# Patient Record
Sex: Male | Born: 1950 | Race: White | Hispanic: No | Marital: Married | Smoking: Never smoker
Health system: Southern US, Community
[De-identification: ages and names within clinical notes are randomized; demographics above are authoritative.]

## PROBLEM LIST (undated history)

## (undated) DIAGNOSIS — I2510 Atherosclerotic heart disease of native coronary artery without angina pectoris: Secondary | ICD-10-CM

## (undated) DIAGNOSIS — I10 Essential (primary) hypertension: Secondary | ICD-10-CM

## (undated) NOTE — *Deleted (*Deleted)
Recreational Therapy Assessment and Plan  Patient Details  Name: Tyler Richards MRN: [id number] Date of Birth: Apr 05, 1951 Today's Date: 07/15/2020  Rehab Potential:   ELOS:     Hospital Problem: Principal Problem:   Right middle cerebral artery stroke [facility])   Past Medical History:      Past Medical History:  Diagnosis Date  . Coronary artery disease   . Hypertension    Past Surgical History:       Past Surgical History:  Procedure Laterality Date  . IR ANGIO INTRA EXTRACRAN SEL COM CAROTID INNOMINATE UNI L MOD SED  06/29/2020  . IR CT HEAD LTD  06/29/2020  . IR CT HEAD LTD  06/29/2020  . IR INTRAVSC STENT CERV CAROTID W/O EMB-PROT MOD SED INC ANGIO  06/29/2020  . IR PERCUTANEOUS ART THROMBECTOMY/INFUSION INTRACRANIAL INC DIAG ANGIO  06/29/2020  . RADIOLOGY WITH ANESTHESIA N/A 06/29/2020   Procedure: IR WITH ANESTHESIA;  Surgeon: Radiologist, Medication, MD;  Location: [facility] OR;  Service: Radiology;  Laterality: N/A;    Assessment & Plan Clinical Impression: Patient is a 48 y.o. year old right-handed male with unremarkable past medical history no prescription medications. Per chart review lives with spouse independent and active prior to admission. 1 level home 2 steps to entry. Presented 06/29/2020 with acute onset of left-sided weakness and slurred speech. Cranial CT scan showed hyperdense distal right ICA and proximal MCA. Blunted appearance of the posterior right putamen. Chronic right high frontal cortex infarct. Patient did not receive TPA. CT angiogram of head and neck emergent large vessel occlusion with no flow seen in the right internal carotid artery or proximal MCA. Patient underwent right MCA thrombectomy and right ICA stent placement 06/29/2020 per interventional radiology. Most recent MRI and imaging revealed acute infarct right basal ganglia with nonprogressive hemorrhage when correlated with a prior CT of 06/29/2020. Lower extremity Dopplers no signs of  DVT. Carotid Dopplers no ICA stenosis. Echocardiogram with ejection fraction of 50 to 55% grade 1 diastolic dysfunction. Admission chemistries unremarkable aside from glucose 104 urine drug screen negative. Patient did receive cardiology consult for bradycardia consistent with hyper vagotonia and currently maintained on ProAmatine as well as Florinef. EKG normal sinus rhythm 70s to 80s occasional sinus bradycardia. Patient was initially maintained on aspirin as well as Brilinta. Patient was extubated 06/30/2020. Urine culture greater 100,000 Enterobacter placed on Maxipime changed to Bactrim. Gastroenterology services consulted due to some bright red blood with bowel movement currently holding off on any endoscopic evaluation monitor hemoglobin hematocrit with latest hemoglobin 9.8 and no further episodes reported. His Brilinta has been placed on hold after rectal outlet bleeding and continues only on low-dose aspirin at the recommendations of neurology services. Currently on a dysphagia #1 thin liquid diet. Therapy evaluations completed and patient was admitted for a comprehensive rehab program. Patient transferred to CIR on 07/09/2020 .    Plan    Recommendations for other services: {RECOMMENDATIONS FOR OTHER SERVICES:3049016}  Discharge Criteria: Patient will be discharged from TR if patient refuses treatment 3 consecutive times without medical reason.  If treatment goals not met, if there is a change in medical status, if patient makes no progress towards goals or if patient is discharged from hospital.  The above assessment, treatment plan, treatment alternatives and goals were discussed and mutually agreed upon: {Assessment/Treatment Plan Discussed/Agreed:3049017}  Benny Henrie 07/15/2020, 9:22 AM

---

## 2020-06-29 ENCOUNTER — Encounter (HOSPITAL_COMMUNITY): Payer: Self-pay | Admitting: Neurology

## 2020-06-29 ENCOUNTER — Emergency Department (HOSPITAL_COMMUNITY): Payer: Medicare Other

## 2020-06-29 ENCOUNTER — Emergency Department (HOSPITAL_COMMUNITY): Payer: Medicare Other | Admitting: Anesthesiology

## 2020-06-29 ENCOUNTER — Inpatient Hospital Stay (HOSPITAL_COMMUNITY): Payer: Medicare Other

## 2020-06-29 ENCOUNTER — Inpatient Hospital Stay (HOSPITAL_COMMUNITY)
Admission: EM | Admit: 2020-06-29 | Discharge: 2020-07-09 | DRG: 023 | Disposition: A | Discharge status: Rehabilitation Facility | Payer: Medicare Other | Attending: Family Medicine | Admitting: Family Medicine

## 2020-06-29 ENCOUNTER — Encounter (HOSPITAL_COMMUNITY)
Admission: EM | Disposition: A | Discharge status: Rehabilitation Facility | Payer: Self-pay | Source: Home / Self Care | Attending: Neurology

## 2020-06-29 DIAGNOSIS — I495 Sick sinus syndrome: Secondary | ICD-10-CM

## 2020-06-29 DIAGNOSIS — K922 Gastrointestinal hemorrhage, unspecified: Secondary | ICD-10-CM

## 2020-06-29 DIAGNOSIS — I63519 Cerebral infarction due to unspecified occlusion or stenosis of unspecified middle cerebral artery: Secondary | ICD-10-CM

## 2020-06-29 DIAGNOSIS — Z23 Encounter for immunization: Secondary | ICD-10-CM | POA: Diagnosis present

## 2020-06-29 DIAGNOSIS — R945 Abnormal results of liver function studies: Secondary | ICD-10-CM

## 2020-06-29 DIAGNOSIS — I69328 Other speech and language deficits following cerebral infarction: Secondary | ICD-10-CM | POA: Diagnosis not present

## 2020-06-29 DIAGNOSIS — I63231 Cerebral infarction due to unspecified occlusion or stenosis of right carotid arteries: Secondary | ICD-10-CM | POA: Diagnosis not present

## 2020-06-29 DIAGNOSIS — I610 Nontraumatic intracerebral hemorrhage in hemisphere, subcortical: Secondary | ICD-10-CM | POA: Diagnosis not present

## 2020-06-29 DIAGNOSIS — H5347 Heteronymous bilateral field defects: Secondary | ICD-10-CM | POA: Diagnosis present

## 2020-06-29 DIAGNOSIS — G8114 Spastic hemiplegia affecting left nondominant side: Secondary | ICD-10-CM | POA: Diagnosis not present

## 2020-06-29 DIAGNOSIS — I63311 Cerebral infarction due to thrombosis of right middle cerebral artery: Secondary | ICD-10-CM | POA: Diagnosis not present

## 2020-06-29 DIAGNOSIS — Z20822 Contact with and (suspected) exposure to covid-19: Secondary | ICD-10-CM | POA: Diagnosis present

## 2020-06-29 DIAGNOSIS — G9349 Other encephalopathy: Secondary | ICD-10-CM | POA: Diagnosis present

## 2020-06-29 DIAGNOSIS — I6930 Unspecified sequelae of cerebral infarction: Secondary | ICD-10-CM | POA: Diagnosis present

## 2020-06-29 DIAGNOSIS — I619 Nontraumatic intracerebral hemorrhage, unspecified: Secondary | ICD-10-CM | POA: Diagnosis not present

## 2020-06-29 DIAGNOSIS — I4510 Unspecified right bundle-branch block: Secondary | ICD-10-CM | POA: Diagnosis not present

## 2020-06-29 DIAGNOSIS — R414 Neurologic neglect syndrome: Secondary | ICD-10-CM | POA: Diagnosis present

## 2020-06-29 DIAGNOSIS — G9340 Encephalopathy, unspecified: Secondary | ICD-10-CM

## 2020-06-29 DIAGNOSIS — B9689 Other specified bacterial agents as the cause of diseases classified elsewhere: Secondary | ICD-10-CM | POA: Diagnosis not present

## 2020-06-29 DIAGNOSIS — G8194 Hemiplegia, unspecified affecting left nondominant side: Secondary | ICD-10-CM | POA: Diagnosis present

## 2020-06-29 DIAGNOSIS — R4182 Altered mental status, unspecified: Secondary | ICD-10-CM | POA: Diagnosis present

## 2020-06-29 DIAGNOSIS — R29717 NIHSS score 17: Secondary | ICD-10-CM | POA: Diagnosis present

## 2020-06-29 DIAGNOSIS — J95821 Acute postprocedural respiratory failure: Secondary | ICD-10-CM | POA: Diagnosis not present

## 2020-06-29 DIAGNOSIS — R471 Dysarthria and anarthria: Secondary | ICD-10-CM | POA: Diagnosis present

## 2020-06-29 DIAGNOSIS — I6521 Occlusion and stenosis of right carotid artery: Secondary | ICD-10-CM | POA: Diagnosis present

## 2020-06-29 DIAGNOSIS — N390 Urinary tract infection, site not specified: Secondary | ICD-10-CM | POA: Diagnosis not present

## 2020-06-29 DIAGNOSIS — K5900 Constipation, unspecified: Secondary | ICD-10-CM | POA: Diagnosis not present

## 2020-06-29 DIAGNOSIS — M109 Gout, unspecified: Secondary | ICD-10-CM | POA: Diagnosis present

## 2020-06-29 DIAGNOSIS — J9601 Acute respiratory failure with hypoxia: Secondary | ICD-10-CM | POA: Insufficient documentation

## 2020-06-29 DIAGNOSIS — K921 Melena: Secondary | ICD-10-CM | POA: Diagnosis not present

## 2020-06-29 DIAGNOSIS — I6389 Other cerebral infarction: Secondary | ICD-10-CM | POA: Diagnosis not present

## 2020-06-29 DIAGNOSIS — I69391 Dysphagia following cerebral infarction: Secondary | ICD-10-CM | POA: Diagnosis not present

## 2020-06-29 DIAGNOSIS — J9690 Respiratory failure, unspecified, unspecified whether with hypoxia or hypercapnia: Secondary | ICD-10-CM | POA: Diagnosis present

## 2020-06-29 DIAGNOSIS — I950 Idiopathic hypotension: Secondary | ICD-10-CM | POA: Diagnosis not present

## 2020-06-29 DIAGNOSIS — T82856A Stenosis of peripheral vascular stent, initial encounter: Secondary | ICD-10-CM | POA: Diagnosis not present

## 2020-06-29 DIAGNOSIS — E876 Hypokalemia: Secondary | ICD-10-CM | POA: Diagnosis not present

## 2020-06-29 DIAGNOSIS — M25521 Pain in right elbow: Secondary | ICD-10-CM | POA: Diagnosis not present

## 2020-06-29 DIAGNOSIS — T45525A Adverse effect of antithrombotic drugs, initial encounter: Secondary | ICD-10-CM | POA: Diagnosis not present

## 2020-06-29 DIAGNOSIS — R001 Bradycardia, unspecified: Secondary | ICD-10-CM | POA: Diagnosis not present

## 2020-06-29 DIAGNOSIS — R55 Syncope and collapse: Secondary | ICD-10-CM | POA: Diagnosis not present

## 2020-06-29 DIAGNOSIS — I4430 Unspecified atrioventricular block: Secondary | ICD-10-CM | POA: Diagnosis not present

## 2020-06-29 DIAGNOSIS — I639 Cerebral infarction, unspecified: Secondary | ICD-10-CM | POA: Diagnosis present

## 2020-06-29 DIAGNOSIS — R1310 Dysphagia, unspecified: Secondary | ICD-10-CM | POA: Diagnosis present

## 2020-06-29 DIAGNOSIS — E785 Hyperlipidemia, unspecified: Secondary | ICD-10-CM | POA: Diagnosis present

## 2020-06-29 DIAGNOSIS — R509 Fever, unspecified: Secondary | ICD-10-CM

## 2020-06-29 DIAGNOSIS — R29810 Facial weakness: Secondary | ICD-10-CM | POA: Diagnosis present

## 2020-06-29 DIAGNOSIS — T457X5A Adverse effect of anticoagulant antagonists, vitamin K and other coagulants, initial encounter: Secondary | ICD-10-CM

## 2020-06-29 DIAGNOSIS — D75839 Thrombocytosis, unspecified: Secondary | ICD-10-CM | POA: Diagnosis not present

## 2020-06-29 DIAGNOSIS — I63511 Cerebral infarction due to unspecified occlusion or stenosis of right middle cerebral artery: Principal | ICD-10-CM | POA: Diagnosis present

## 2020-06-29 DIAGNOSIS — I6601 Occlusion and stenosis of right middle cerebral artery: Secondary | ICD-10-CM | POA: Diagnosis not present

## 2020-06-29 DIAGNOSIS — I69354 Hemiplegia and hemiparesis following cerebral infarction affecting left non-dominant side: Secondary | ICD-10-CM | POA: Diagnosis not present

## 2020-06-29 DIAGNOSIS — K625 Hemorrhage of anus and rectum: Secondary | ICD-10-CM | POA: Diagnosis not present

## 2020-06-29 DIAGNOSIS — R569 Unspecified convulsions: Secondary | ICD-10-CM | POA: Diagnosis not present

## 2020-06-29 DIAGNOSIS — I10 Essential (primary) hypertension: Secondary | ICD-10-CM | POA: Diagnosis present

## 2020-06-29 DIAGNOSIS — M62838 Other muscle spasm: Secondary | ICD-10-CM | POA: Diagnosis not present

## 2020-06-29 DIAGNOSIS — G8110 Spastic hemiplegia affecting unspecified side: Secondary | ICD-10-CM | POA: Diagnosis not present

## 2020-06-29 DIAGNOSIS — I951 Orthostatic hypotension: Secondary | ICD-10-CM | POA: Diagnosis not present

## 2020-06-29 DIAGNOSIS — G522 Disorders of vagus nerve: Secondary | ICD-10-CM | POA: Diagnosis not present

## 2020-06-29 DIAGNOSIS — R7989 Other specified abnormal findings of blood chemistry: Secondary | ICD-10-CM | POA: Diagnosis not present

## 2020-06-29 DIAGNOSIS — M25562 Pain in left knee: Secondary | ICD-10-CM | POA: Diagnosis not present

## 2020-06-29 DIAGNOSIS — R197 Diarrhea, unspecified: Secondary | ICD-10-CM | POA: Diagnosis not present

## 2020-06-29 DIAGNOSIS — R32 Unspecified urinary incontinence: Secondary | ICD-10-CM | POA: Diagnosis not present

## 2020-06-29 DIAGNOSIS — T444X5A Adverse effect of predominantly alpha-adrenoreceptor agonists, initial encounter: Secondary | ICD-10-CM | POA: Diagnosis not present

## 2020-06-29 DIAGNOSIS — Z789 Other specified health status: Secondary | ICD-10-CM

## 2020-06-29 HISTORY — PX: IR ANGIO INTRA EXTRACRAN SEL COM CAROTID INNOMINATE UNI L MOD SED: IMG5358

## 2020-06-29 HISTORY — PX: IR CT HEAD LTD: IMG2386

## 2020-06-29 HISTORY — PX: IR INTRAVSC STENT CERV CAROTID W/O EMB-PROT MOD SED INC ANGIO: IMG2304

## 2020-06-29 HISTORY — PX: IR PERCUTANEOUS ART THROMBECTOMY/INFUSION INTRACRANIAL INC DIAG ANGIO: IMG6087

## 2020-06-29 HISTORY — PX: RADIOLOGY WITH ANESTHESIA: SHX6223

## 2020-06-29 LAB — POCT I-STAT 7, (LYTES, BLD GAS, ICA,H+H)
Acid-base deficit: 1 mmol/L (ref 0.0–2.0)
Bicarbonate: 24.4 mmol/L (ref 20.0–28.0)
Calcium, Ion: 1.21 mmol/L (ref 1.15–1.40)
HCT: 35 % — ABNORMAL LOW (ref 39.0–52.0)
Hemoglobin: 11.9 g/dL — ABNORMAL LOW (ref 13.0–17.0)
O2 Saturation: 100 %
Potassium: 3.8 mmol/L (ref 3.5–5.1)
Sodium: 138 mmol/L (ref 135–145)
TCO2: 26 mmol/L (ref 22–32)
pCO2 arterial: 41.3 mmHg (ref 32.0–48.0)
pH, Arterial: 7.38 (ref 7.350–7.450)
pO2, Arterial: 252 mmHg — ABNORMAL HIGH (ref 83.0–108.0)

## 2020-06-29 LAB — COMPREHENSIVE METABOLIC PANEL
ALT: 17 U/L (ref 0–44)
AST: 36 U/L (ref 15–41)
Albumin: 3.3 g/dL — ABNORMAL LOW (ref 3.5–5.0)
Alkaline Phosphatase: 61 U/L (ref 38–126)
Anion gap: 13 (ref 5–15)
BUN: 14 mg/dL (ref 8–23)
CO2: 20 mmol/L — ABNORMAL LOW (ref 22–32)
Calcium: 9.1 mg/dL (ref 8.9–10.3)
Chloride: 103 mmol/L (ref 98–111)
Creatinine, Ser: 0.87 mg/dL (ref 0.61–1.24)
GFR calc Af Amer: >60 mL/min (ref >60)
GFR calc non Af Amer: >60 mL/min (ref >60)
Glucose, Bld: 104 mg/dL — ABNORMAL HIGH (ref 70–99)
Potassium: 5 mmol/L (ref 3.5–5.1)
Sodium: 136 mmol/L (ref 135–145)
Total Bilirubin: 1.8 mg/dL — ABNORMAL HIGH (ref 0.3–1.2)
Total Protein: 6.5 g/dL (ref 6.5–8.1)

## 2020-06-29 LAB — URINALYSIS, ROUTINE W REFLEX MICROSCOPIC
Bilirubin Urine: NEGATIVE
Glucose, UA: NEGATIVE mg/dL
Ketones, ur: NEGATIVE mg/dL
Leukocytes,Ua: NEGATIVE
Nitrite: NEGATIVE
Protein, ur: NEGATIVE mg/dL
Specific Gravity, Urine: 1.044 — ABNORMAL HIGH (ref 1.005–1.030)
pH: 5 (ref 5.0–8.0)

## 2020-06-29 LAB — DIFFERENTIAL
Abs Immature Granulocytes: 0.04 10*3/uL (ref 0.00–0.07)
Basophils Absolute: 0.1 10*3/uL (ref 0.0–0.1)
Basophils Relative: 1 %
Eosinophils Absolute: 0 10*3/uL (ref 0.0–0.5)
Eosinophils Relative: 0 %
Immature Granulocytes: 0 %
Lymphocytes Relative: 12 %
Lymphs Abs: 1.1 10*3/uL (ref 0.7–4.0)
Monocytes Absolute: 0.9 10*3/uL (ref 0.1–1.0)
Monocytes Relative: 10 %
Neutro Abs: 6.8 10*3/uL (ref 1.7–7.7)
Neutrophils Relative %: 77 %

## 2020-06-29 LAB — RAPID URINE DRUG SCREEN, HOSP PERFORMED
Amphetamines: NOT DETECTED
Barbiturates: NOT DETECTED
Benzodiazepines: NOT DETECTED
Cocaine: NOT DETECTED
Opiates: NOT DETECTED
Tetrahydrocannabinol: NOT DETECTED

## 2020-06-29 LAB — CBG MONITORING, ED: Glucose-Capillary: 88 mg/dL (ref 70–99)

## 2020-06-29 LAB — CBC
HCT: 41.2 % (ref 39.0–52.0)
Hemoglobin: 13.9 g/dL (ref 13.0–17.0)
MCH: 32.6 pg (ref 26.0–34.0)
MCHC: 33.7 g/dL (ref 30.0–36.0)
MCV: 96.5 fL (ref 80.0–100.0)
Platelets: 320 10*3/uL (ref 150–400)
RBC: 4.27 MIL/uL (ref 4.22–5.81)
RDW: 12.5 % (ref 11.5–15.5)
WBC: 9 10*3/uL (ref 4.0–10.5)
nRBC: 0 % (ref 0.0–0.2)

## 2020-06-29 LAB — I-STAT CHEM 8, ED
BUN: 17 mg/dL (ref 8–23)
Calcium, Ion: 1.12 mmol/L — ABNORMAL LOW (ref 1.15–1.40)
Chloride: 105 mmol/L (ref 98–111)
Creatinine, Ser: 0.7 mg/dL (ref 0.61–1.24)
Glucose, Bld: 105 mg/dL — ABNORMAL HIGH (ref 70–99)
HCT: 42 % (ref 39.0–52.0)
Hemoglobin: 14.3 g/dL (ref 13.0–17.0)
Potassium: 4.5 mmol/L (ref 3.5–5.1)
Sodium: 138 mmol/L (ref 135–145)
TCO2: 23 mmol/L (ref 22–32)

## 2020-06-29 LAB — MRSA PCR SCREENING: MRSA by PCR: NEGATIVE

## 2020-06-29 LAB — RESPIRATORY PANEL BY RT PCR (FLU A&B, COVID)
Influenza A by PCR: NEGATIVE
Influenza B by PCR: NEGATIVE
SARS Coronavirus 2 by RT PCR: NEGATIVE

## 2020-06-29 LAB — PROTIME-INR
INR: 1 (ref 0.8–1.2)
Prothrombin Time: 12.4 seconds (ref 11.4–15.2)

## 2020-06-29 LAB — APTT: aPTT: 28 seconds (ref 24–36)

## 2020-06-29 LAB — ETHANOL: Alcohol, Ethyl (B): <10 mg/dL (ref <10)

## 2020-06-29 SURGERY — IR WITH ANESTHESIA
Anesthesia: General

## 2020-06-29 MED ORDER — IOHEXOL 300 MG/ML  SOLN
150.0000 mL | Freq: Once | INTRAMUSCULAR | Status: AC | PRN
  Administered 2020-06-29: 75 mL via INTRAVENOUS

## 2020-06-29 MED ORDER — ASPIRIN 325 MG PO TABS
ORAL_TABLET | ORAL | Status: DC | PRN
  Administered 2020-06-29: 81 mg

## 2020-06-29 MED ORDER — SUGAMMADEX SODIUM 200 MG/2ML IV SOLN
INTRAVENOUS | Status: DC | PRN
  Administered 2020-06-29: 200 mg via INTRAVENOUS

## 2020-06-29 MED ORDER — PROPOFOL 500 MG/50ML IV EMUL
INTRAVENOUS | Status: DC | PRN
  Administered 2020-06-29: 50 ug/kg/min via INTRAVENOUS

## 2020-06-29 MED ORDER — PANTOPRAZOLE SODIUM 40 MG IV SOLR
40.0000 mg | Freq: Every day | INTRAVENOUS | Status: DC
  Administered 2020-06-30: 40 mg via INTRAVENOUS
  Filled 2020-06-29 (×2): qty 40

## 2020-06-29 MED ORDER — PROPOFOL 1000 MG/100ML IV EMUL: 0.0000 ug/kg/min | INTRAVENOUS | Status: DC

## 2020-06-29 MED ORDER — CLOPIDOGREL BISULFATE 300 MG PO TABS
ORAL_TABLET | ORAL | Status: AC
  Filled 2020-06-29: qty 1

## 2020-06-29 MED ORDER — LACTATED RINGERS IV SOLN: INTRAVENOUS | Status: DC | PRN

## 2020-06-29 MED ORDER — ASPIRIN 81 MG PO CHEW
CHEWABLE_TABLET | ORAL | Status: AC
  Filled 2020-06-29: qty 1

## 2020-06-29 MED ORDER — INFLUENZA VAC A&B SA ADJ QUAD 0.5 ML IM PRSY
0.5000 mL | PREFILLED_SYRINGE | INTRAMUSCULAR | Status: AC
  Administered 2020-07-09: 0.5 mL via INTRAMUSCULAR
  Filled 2020-06-29 (×2): qty 0.5

## 2020-06-29 MED ORDER — FENTANYL CITRATE (PF) 100 MCG/2ML IJ SOLN: 25.0000 ug | INTRAMUSCULAR | Status: DC | PRN

## 2020-06-29 MED ORDER — ACETAMINOPHEN 325 MG PO TABS: 650.0000 mg | ORAL_TABLET | ORAL | Status: DC | PRN

## 2020-06-29 MED ORDER — TICAGRELOR 90 MG PO TABS
90.0000 mg | ORAL_TABLET | Freq: Two times a day (BID) | ORAL | Status: DC
  Administered 2020-07-01 – 2020-07-02 (×4): 90 mg via ORAL
  Filled 2020-06-29 (×4): qty 1

## 2020-06-29 MED ORDER — PNEUMOCOCCAL VAC POLYVALENT 25 MCG/0.5ML IJ INJ
0.5000 mL | INJECTION | INTRAMUSCULAR | Status: AC
  Administered 2020-07-09: 0.5 mL via INTRAMUSCULAR
  Filled 2020-06-29 (×2): qty 0.5

## 2020-06-29 MED ORDER — ACETAMINOPHEN 650 MG RE SUPP: 650.0000 mg | RECTAL | Status: DC | PRN

## 2020-06-29 MED ORDER — TIROFIBAN HCL IN NACL 5-0.9 MG/100ML-% IV SOLN
INTRAVENOUS | Status: AC
  Filled 2020-06-29: qty 100

## 2020-06-29 MED ORDER — TICAGRELOR 90 MG PO TABS
90.0000 mg | ORAL_TABLET | Freq: Two times a day (BID) | ORAL | Status: DC
  Administered 2020-06-29 – 2020-06-30 (×2): 90 mg
  Filled 2020-06-29 (×2): qty 1

## 2020-06-29 MED ORDER — DOCUSATE SODIUM 50 MG/5ML PO LIQD
100.0000 mg | Freq: Two times a day (BID) | ORAL | Status: DC
  Filled 2020-06-29 (×2): qty 10

## 2020-06-29 MED ORDER — IOHEXOL 300 MG/ML  SOLN
50.0000 mL | Freq: Once | INTRAMUSCULAR | Status: AC | PRN
  Administered 2020-06-29: 25 mL via INTRAVENOUS

## 2020-06-29 MED ORDER — ONDANSETRON HCL 4 MG/2ML IJ SOLN
INTRAMUSCULAR | Status: DC | PRN
  Administered 2020-06-29: 4 mg via INTRAVENOUS

## 2020-06-29 MED ORDER — SENNOSIDES-DOCUSATE SODIUM 8.6-50 MG PO TABS: 1.0000 | ORAL_TABLET | Freq: Every evening | ORAL | Status: DC | PRN

## 2020-06-29 MED ORDER — LIDOCAINE HCL (PF) 1 % IJ SOLN
INTRAMUSCULAR | Status: DC | PRN
  Administered 2020-06-29: 5 mL

## 2020-06-29 MED ORDER — SODIUM CHLORIDE 0.9 % IV SOLN: 250.0000 mL | INTRAVENOUS | Status: DC

## 2020-06-29 MED ORDER — GLYCOPYRROLATE PF 0.2 MG/ML IJ SOSY
PREFILLED_SYRINGE | INTRAMUSCULAR | Status: DC | PRN
  Administered 2020-06-29: .2 mg via INTRAVENOUS
  Administered 2020-06-29 (×3): .1 mg via INTRAVENOUS

## 2020-06-29 MED ORDER — IOHEXOL 350 MG/ML SOLN
100.0000 mL | Freq: Once | INTRAVENOUS | Status: AC | PRN
  Administered 2020-06-29: 100 mL via INTRAVENOUS

## 2020-06-29 MED ORDER — NITROGLYCERIN 1 MG/10 ML FOR IR/CATH LAB
INTRA_ARTERIAL | Status: AC
  Filled 2020-06-29: qty 10

## 2020-06-29 MED ORDER — CLEVIDIPINE BUTYRATE 0.5 MG/ML IV EMUL: 0.0000 mg/h | INTRAVENOUS | Status: DC

## 2020-06-29 MED ORDER — SODIUM CHLORIDE 0.9 % IV SOLN: INTRAVENOUS | Status: DC | PRN

## 2020-06-29 MED ORDER — ROCURONIUM BROMIDE 100 MG/10ML IV SOLN
INTRAVENOUS | Status: DC | PRN
  Administered 2020-06-29: 50 mg via INTRAVENOUS
  Administered 2020-06-29: 10 mg via INTRAVENOUS
  Administered 2020-06-29: 50 mg via INTRAVENOUS

## 2020-06-29 MED ORDER — SUCCINYLCHOLINE CHLORIDE 20 MG/ML IJ SOLN
INTRAMUSCULAR | Status: DC | PRN
  Administered 2020-06-29: 120 mg via INTRAVENOUS

## 2020-06-29 MED ORDER — ACETAMINOPHEN 160 MG/5ML PO SOLN: 650.0000 mg | ORAL | Status: DC | PRN

## 2020-06-29 MED ORDER — EPHEDRINE SULFATE-NACL 50-0.9 MG/10ML-% IV SOSY
PREFILLED_SYRINGE | INTRAVENOUS | Status: DC | PRN
  Administered 2020-06-29 (×8): 5 mg via INTRAVENOUS

## 2020-06-29 MED ORDER — ASPIRIN 81 MG PO CHEW
81.0000 mg | CHEWABLE_TABLET | Freq: Every day | ORAL | Status: DC
  Administered 2020-06-30 – 2020-07-07 (×2): 81 mg
  Filled 2020-06-29 (×2): qty 1

## 2020-06-29 MED ORDER — CHLORHEXIDINE GLUCONATE CLOTH 2 % EX PADS
6.0000 | MEDICATED_PAD | Freq: Every day | CUTANEOUS | Status: DC
  Administered 2020-06-29 – 2020-07-09 (×10): 6 via TOPICAL

## 2020-06-29 MED ORDER — PROPOFOL 10 MG/ML IV BOLUS
INTRAVENOUS | Status: DC | PRN
  Administered 2020-06-29: 120 mg via INTRAVENOUS
  Administered 2020-06-29: 20 mg via INTRAVENOUS
  Administered 2020-06-29: 40 mg via INTRAVENOUS

## 2020-06-29 MED ORDER — NOREPINEPHRINE 4 MG/250ML-% IV SOLN
2.0000 ug/min | INTRAVENOUS | Status: DC
  Administered 2020-06-29: 4 ug/min via INTRAVENOUS
  Administered 2020-06-29: 2 ug/min via INTRAVENOUS
  Administered 2020-06-30: 7 ug/min via INTRAVENOUS
  Administered 2020-06-30: 10 ug/min via INTRAVENOUS
  Administered 2020-06-30: 8 ug/min via INTRAVENOUS
  Administered 2020-07-01: 6 ug/min via INTRAVENOUS
  Administered 2020-07-02: 3 ug/min via INTRAVENOUS
  Administered 2020-07-02: 4 ug/min via INTRAVENOUS
  Administered 2020-07-03: 2 ug/min via INTRAVENOUS
  Filled 2020-06-29 (×7): qty 250

## 2020-06-29 MED ORDER — CANGRELOR TETRASODIUM 50 MG IV SOLR
INTRAVENOUS | Status: AC
  Filled 2020-06-29: qty 50

## 2020-06-29 MED ORDER — ORAL CARE MOUTH RINSE
15.0000 mL | OROMUCOSAL | Status: DC
  Administered 2020-06-29 – 2020-06-30 (×6): 15 mL via OROMUCOSAL

## 2020-06-29 MED ORDER — CANGRELOR BOLUS VIA INFUSION
INTRAVENOUS | Status: DC | PRN
  Administered 2020-06-29: 818.1 ug via INTRAVENOUS

## 2020-06-29 MED ORDER — VERAPAMIL HCL 2.5 MG/ML IV SOLN
INTRAVENOUS | Status: AC
  Filled 2020-06-29: qty 2

## 2020-06-29 MED ORDER — EPTIFIBATIDE 20 MG/10ML IV SOLN
INTRAVENOUS | Status: AC
  Filled 2020-06-29: qty 10

## 2020-06-29 MED ORDER — STROKE: EARLY STAGES OF RECOVERY BOOK
Freq: Once | Status: DC
  Filled 2020-06-29: qty 1

## 2020-06-29 MED ORDER — TICAGRELOR 90 MG PO TABS
ORAL_TABLET | ORAL | Status: AC
  Filled 2020-06-29: qty 2

## 2020-06-29 MED ORDER — FENTANYL CITRATE (PF) 100 MCG/2ML IJ SOLN
INTRAMUSCULAR | Status: AC
  Filled 2020-06-29: qty 2

## 2020-06-29 MED ORDER — LIDOCAINE 2% (20 MG/ML) 5 ML SYRINGE
INTRAMUSCULAR | Status: DC | PRN
  Administered 2020-06-29: 40 mg via INTRAVENOUS

## 2020-06-29 MED ORDER — FENTANYL CITRATE (PF) 250 MCG/5ML IJ SOLN
INTRAMUSCULAR | Status: DC | PRN
Start: 2020-06-29 — End: 2020-06-29
  Administered 2020-06-29: 25 ug via INTRAVENOUS

## 2020-06-29 MED ORDER — CEFAZOLIN SODIUM-DEXTROSE 2-3 GM-%(50ML) IV SOLR
INTRAVENOUS | Status: DC | PRN
  Administered 2020-06-29: 2 g via INTRAVENOUS

## 2020-06-29 MED ORDER — SODIUM CHLORIDE 0.9 % IV SOLN: INTRAVENOUS | Status: DC

## 2020-06-29 MED ORDER — PROPOFOL 1000 MG/100ML IV EMUL
0.0000 ug/kg/min | INTRAVENOUS | Status: DC
  Administered 2020-06-29 – 2020-06-30 (×3): 30 ug/kg/min via INTRAVENOUS
  Filled 2020-06-29 (×2): qty 100

## 2020-06-29 MED ORDER — ACETAMINOPHEN 650 MG RE SUPP
650.0000 mg | RECTAL | Status: DC | PRN
  Administered 2020-06-30 – 2020-07-01 (×2): 650 mg via RECTAL
  Filled 2020-06-29 (×2): qty 1

## 2020-06-29 MED ORDER — POLYETHYLENE GLYCOL 3350 17 G PO PACK: 17.0000 g | PACK | Freq: Every day | ORAL | Status: DC

## 2020-06-29 MED ORDER — CHLORHEXIDINE GLUCONATE 0.12% ORAL RINSE (MEDLINE KIT)
15.0000 mL | Freq: Two times a day (BID) | OROMUCOSAL | Status: DC
  Administered 2020-06-29 – 2020-06-30 (×2): 15 mL via OROMUCOSAL

## 2020-06-29 MED ORDER — CEFAZOLIN SODIUM-DEXTROSE 2-4 GM/100ML-% IV SOLN
INTRAVENOUS | Status: AC
  Filled 2020-06-29: qty 100

## 2020-06-29 MED ORDER — ACETAMINOPHEN 325 MG PO TABS
650.0000 mg | ORAL_TABLET | ORAL | Status: DC | PRN
  Administered 2020-07-01 – 2020-07-09 (×14): 650 mg via ORAL
  Filled 2020-06-29 (×15): qty 2

## 2020-06-29 MED ORDER — PHENYLEPHRINE HCL-NACL 10-0.9 MG/250ML-% IV SOLN
INTRAVENOUS | Status: DC | PRN
  Administered 2020-06-29: 20 ug/min via INTRAVENOUS

## 2020-06-29 MED ORDER — ASPIRIN 81 MG PO CHEW
81.0000 mg | CHEWABLE_TABLET | Freq: Every day | ORAL | Status: DC
  Administered 2020-07-01 – 2020-07-09 (×8): 81 mg via ORAL
  Filled 2020-06-29 (×9): qty 1

## 2020-06-29 MED ORDER — TICAGRELOR 60 MG PO TABS
ORAL_TABLET | ORAL | Status: DC | PRN
  Administered 2020-06-29: 180 mg

## 2020-06-29 NOTE — Progress Notes (Signed)
Patient ID: Tyler Richards, male   DOB: [birth year], 69 y.o.   MRN: [id number] INR 69 Yr RT H M MRSS 0 LSW 8 pm LN.New onset Lt sided ewakness and rt gaze deviationand Lt sided neglect. CT brain NO ICH ASPECTS 10. CTA occloded RT ICA terrminus,RTT MCA and RT ACA prox and Rt ICA proximally. CTP core of 36 ml versus penumbra of 142 ml.Endovasculkar treatment D/W spouse and patient.Procedure,reasons,alternatives reviewed.. Risks of ICH of 10 %,worsening neuro deficit,death inability to revascularize were reviewed. Spouse expressed understanding and provided consent to proceed. S.Briann Sarchet MD

## 2020-06-29 NOTE — Anesthesia Preprocedure Evaluation (Addendum)
Anesthesia Evaluation  Patient identified by MRN, date of birth, ID band  Reviewed: Unable to perform ROS - Chart review onlyPreop documentation limited or incomplete due to emergent nature of procedure.  Airway Mallampati: III  TM Distance: >3 FB Neck ROM: Full    Dental  (+) Teeth Intact   Pulmonary    breath sounds clear to auscultation       Cardiovascular  Rhythm:Regular Rate:Normal     Neuro/Psych    GI/Hepatic   Endo/Other    Renal/GU      Musculoskeletal   Abdominal Normal abdominal exam  (+)   Peds  Hematology   Anesthesia Other Findings   Reproductive/Obstetrics                             Anesthesia Physical Anesthesia Plan  ASA: III and emergent  Anesthesia Plan: General   Post-op Pain Management:    Induction: Intravenous, Rapid sequence and Cricoid pressure planned  PONV Risk Score and Plan: 2 and Treatment may vary due to age or medical condition and Ondansetron  Airway Management Planned: Oral ETT  Additional Equipment: Arterial line  Intra-op Plan:   Post-operative Plan: Possible Post-op intubation/ventilation  Informed Consent:     History available from chart only and Only emergency history available  Plan Discussed with: CRNA  Anesthesia Plan Comments:         Anesthesia Quick Evaluation

## 2020-06-29 NOTE — H&P (Addendum)
Admission H&P    Chief Complaint: Acute onset of left hemiplegia.  HPI: Tyler Richards is an 69 y.o. male who presents acutely to the ED with acute onset of left sided weakness. On awakening this AM, he tried to get out of bed and fell. His wife then helped him back to bed and noted left sided weakness. She called EMS and on their arrival the patient was noted to be flaccid on the left, with left facial droop and right gaze deviation. CBG was 115, HR 50 and BP 130/70. LKN was 8 PM last night.    He has no prior history of stroke or ICH.   LSN: 8 PM tPA Given: No: Out of time window NIHSS: 17  PMHx Gout No other medical history per wife  Meds: ASA   No family history on file. Social History:  has no history on file for tobacco use, alcohol use, and drug use.  Allergies: Not on File  (Not in a hospital admission)   ROS: The patient denies any symptoms. He is unaware of his left sided weakness.   Physical Examination: There were no vitals taken for this visit.  HEENT-  [place]/AT  Lungs - Respirations unlabored Extremities - Warm and well perfused. 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   Neurologic Examination: Mental Status: Awake and alert. Fully oriented. Left sided neglect. Speech fluent with intact naming and comprehension. Subtle dysarthria.  Cranial Nerves: II:  Left sided visual field cut. PERRL.  III,IV, VI: Right sided gaze deviation. Unable to cross the midline to the left volitionally, but can be overcome with oculocephalic maneuver.  V,VII: Left facial droop.  VIII: Hearing intact to voice IX,X: Pharyngeal dysarthria XI: Head preferentially rotated to the right XII: Tongue deviates to the left Motor: RUE and RLE 5/5 LUE and LLE 0/5 Sensory: Insensate on the left Deep Tendon Reflexes:  3+ bilateral brachioradialis and biceps 3+ bilateral patellae Plantars: Right: Upgoing   Left: Upgoing Cerebellar: No ataxia with right FNF. Unable to perform on the left.  Gait:  Deferred  Results for orders placed or performed during the hospital encounter of 06/29/20 (from the past 48 hour(s))  CBG monitoring, ED     Status: None   Collection Time: 06/29/20  9:04 AM  Result Value Ref Range   Glucose-Capillary 88 70 - 99 mg/dL    Comment: Glucose reference range applies only to samples taken after fasting for at least 8 hours.  I-stat chem 8, ED     Status: Abnormal   Collection Time: 06/29/20  9:15 AM  Result Value Ref Range   Sodium 138 135 - 145 mmol/L   Potassium 4.5 3.5 - 5.1 mmol/L   Chloride 105 98 - 111 mmol/L   BUN 17 8 - 23 mg/dL   Creatinine, Ser 0.70 0.61 - 1.24 mg/dL   Glucose, Bld 105 (H) 70 - 99 mg/dL    Comment: Glucose reference range applies only to samples taken after fasting for at least 8 hours.   Calcium, Ion 1.12 (L) 1.15 - 1.40 mmol/L   TCO2 23 22 - 32 mmol/L   Hemoglobin 14.3 13.0 - 17.0 g/dL   HCT 42.0 39 - 52 %   No results found.  Assessment: 69 y.o. male presenting with acute onset of left sided weakness, left hemianopsia, left facial droop and left hemineglect 1. NIHSS= 17.  2. Out of the IV tPA time window.  3. CT head with no parenchymal hypodensity. However, a prominent dense MCA  sign is seen on the right.  4. CTA of head and neck demonstrates acute right ICA and MCA occlusions.  5. CTP reveals a core infarct volume of 36 cc with a penumbra of 142 cc within the right MCA territory.  6. The patient is a candidate for thrombectomy. Informed consent was obtained from the patient's wife due to his anosognosia, which precludes informed medical decision making on his part at this time. Full risks/benefits were reviewed with wife by both myself and Dr. Estanislado Pandy.   7. Stroke Risk Factors - None  Recommendations: 1. Following VIR, the patient will be admitted to the ICU under the Neurology service. 2. No anticoagulants or antiplatelet medications for at least 24 hours following VIR. DVT prophylaxis with SCDs.  3. BP management  with clevidipine. Goal SBP < 160 4. Will need to be started on a statin 5. MRI of the brain without contrast 6. Repeat CT head in 24 hours. If negative for hemorrhagic conversion, can start on DAPT. Will need DAPT as patient is classifiable as having failed ASA monotherapy.  7. PT consult, OT consult, Speech consult 8. TTE 9. Cardiac telemetry 10. Risk factor modification 11.  HgbA1c, fasting lipid panel  A total of 60 minutes was spent in the emergent neurological evaluation and management of this critically ill patient.   Electronically signed: Dr. Kerney Elbe 06/29/2020, 9:25 AM

## 2020-06-29 NOTE — Transfer of Care (Signed)
Immediate Anesthesia Transfer of Care Note  Patient: Tyler Richards  Procedure(s) Performed: IR WITH ANESTHESIA (N/A )  Patient Location: PACU  Anesthesia Type:General  Level of Consciousness: Patient remains intubated per anesthesia plan  Airway & Oxygen Therapy: Patient placed on Ventilator (see vital sign flow sheet for setting)  Post-op Assessment: Report given to RN and Post -op Vital signs reviewed and stable  Post vital signs: Reviewed  Last Vitals:  Vitals Value Taken Time  BP 153/79 06/29/20 1430  Temp    Pulse 72 06/29/20 1437  Resp 15 06/29/20 1437  SpO2 100 % 06/29/20 1437  Vitals shown include unvalidated device data.  Last Pain:  Vitals:   06/29/20 0940  TempSrc: Oral         Complications: No complications documented.

## 2020-06-29 NOTE — ED Triage Notes (Signed)
Pt arrives from home via EMS with complaints of stroke symptoms. Pt LSN @ 2000 06/28/2020. Pt not feeling well and went to bed. Found by wife this morning on floor around 0800. Pt found with slurred speech, left side paralysis, right sided gaze and left sided facial droop.   130/70 CBG 115 HR 50 98% RA  Pt alert and oriented X4

## 2020-06-29 NOTE — Progress Notes (Signed)
Belongings brought up with patient: White t shirt Boxers hankercheif

## 2020-06-29 NOTE — Consult Note (Signed)
NAME:  [name], MRN:  [id number], DOB:  [birth year], LOS: 0 ADMISSION DATE:  06/29/2020, CONSULTATION DATE:  06/29/20 REFERRING MD:  Estanislado Pandy  CHIEF COMPLAINT:  AMS   Brief History   Tyler Richards is a 69 y.o. male who was admitted 10/4 with R MCA CVA s/p revascularization and R ICA stent assisted angioplasty and revascularization.  History of present illness   Pt is encephelopathic; therefore, this HPI is obtained from chart review. Tyler Richards is a 69 y.o. male who has no known PMH.  He presented to [facility] ED 10/4 with left hemiplegia.  He was found to have CVA and was taken to IR for revascularization of occluded R MCA and stent assisted angioplasty of symptomatic R ICA with revascularization.  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  He was given 81mg  ASA, 180mg  Brilintia, and 9cc bolus Cangrelor.  Due to 2 focal areas of hemorrhage on follow up CT brain, cangrelor infusion was deferred.  He was not fully responsive on attempted sedation reversal; therefore, was left intubated and PCCM was asked toa ssist with vent management.  Past Medical History  has Arterial ischemic stroke, MCA, right, acute ([place]) and Stroke (cerebrum) ([place]) on their problem list.  Significant Hospital Events   10/4 > admit.  Consults:  PCCM.  Procedures:  ETT 10/4 >   Significant Diagnostic Tests:  CT / CTA head 10/4 > LVO R ICA / prox MCA. MRi brain 10/4 >  CT head 10/5 >  Echo 10/4 >   Micro Data:  COVID 10/4 > neg. Flu 10/4 > neg.  Antimicrobials:  None.   Interim history/subjective:  Sedated, not following commands.  Objective:  Blood pressure (!) 132/53, pulse 62, temperature 98.3 F (36.8 C), temperature source Oral, SpO2 100 %.        Intake/Output Summary (Last 24 hours) at 06/29/2020 1419 Last data filed at 06/29/2020 1344 Gross per 24 hour  Intake 1350 ml  Output --  Net 1350 ml   There were no vitals filed for this  visit.  Examination: General: Adult male, in NAD. Neuro: Sedated, not following commands. HEENT: [place]/AT. Sclerae anicteric.  ETT in place. Cardiovascular: RRR, no M/R/G.  Lungs: Respirations even and unlabored.  CTA bilaterally, No W/R/R.  Abdomen: BS x 4, soft, NT/ND.  Musculoskeletal: No gross deformities, no edema.  Skin: Intact, warm, no rashes.  Assessment & Plan:   R MCA and ICA occlusion - s/p IR R MCA revascularization and stent assisted angioplasty of symptomatic R ICA with revascularization.  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  Cangrelor infusion deferred due to 2 focal areas of  hemorrhage on follow up CT). - Post procedure management per IR. - Stroke workup / management per neuro. - F/u on CT head, MRI brain, echo.  Permissive Hypertension. - Goal SBP 140 - 160 per neuro.  Respiratory insufficiency - due to inability to protect the airway in the setting of above. - Full vent support. - Wean as able. - Daily SBT. - Bronchial hygiene. - Follow CXR.    Best Practice:  Diet: NPO. Pain/Anxiety/Delirium protocol (if indicated): Propofol gtt / Fentanyl PRN. RASS goal -1. VAP protocol (if indicated): In place. DVT prophylaxis: SCD's. GI prophylaxis: PPI. Glucose control: SSI if glucose consistently > 180. Mobility: Bedrest. Code Status: Full. Family Communication: Per primary. Disposition: ICU.  Labs  CBC: Recent Labs  Lab 06/29/20 0904 06/29/20 0915  WBC 9.0  --   NEUTROABS 6.8  --   HGB 13.9 14.3  HCT 41.2 42.0  MCV 96.5  --   PLT 320  --    Basic Metabolic Panel: Recent Labs  Lab 06/29/20 0904 06/29/20 0915  NA 136 138  K 5.0 4.5  CL 103 105  CO2 20*  --   GLUCOSE 104* 105*  BUN 14 17  CREATININE 0.87 0.70  CALCIUM 9.1  --    GFR: CrCl cannot be calculated (Unknown ideal weight.). Recent Labs  Lab 06/29/20 0904  WBC 9.0    Liver Function Tests: Recent Labs  Lab 06/29/20 0904  AST 36  ALT 17  ALKPHOS 61  BILITOT 1.8*  PROT 6.5  ALBUMIN 3.3*   No results for input(s): LIPASE, AMYLASE in the last 168 hours. No results for input(s): AMMONIA in the last 168 hours. ABG    Component Value Date/Time   TCO2 23 06/29/2020 0915    Coagulation Profile: Recent Labs  Lab 06/29/20 0904  INR 1.0   Cardiac Enzymes: No results for input(s): CKTOTAL, CKMB, CKMBINDEX, TROPONINI in the last 168 hours. HbA1C: No results found for: HGBA1C CBG: Recent Labs  Lab 06/29/20 0904  GLUCAP 88    Review of Systems:   Unable to obtain as pt is encephalopathic.  Past medical history  He,  has no past medical history on file.   Surgical History   unknown  Social History      Family history   His family history is not on file.   Allergies No Known Allergies   Home meds  Prior to Admission medications   Medication Sig Start Date End Date Taking? Authorizing Provider  acetaminophen (TYLENOL) 325 MG tablet Take 650 mg by mouth every 6 (six) hours as needed for mild pain or headache.   Yes [provider]  aspirin EC 81 MG tablet Take 81 mg by mouth daily as needed for mild pain. Swallow whole.   Yes [provider]    Critical care time: 40 min.    Montey Hora, [facility] 06/29/2020, 2:19 PM

## 2020-06-29 NOTE — Code Documentation (Addendum)
Stroke Response Nurse Documentation Code Documentation  TRELYN VANDERLINDE is a 69 y.o. male arriving to [facility] ED via [facility] on 10/04 with no past medical hx per patient. He has not seen a doctor "for a while" and only takes Tylenol and ASA with headaches or pain. Code stroke was activated by EMS after patient was found at home to have left sided weakness, right gaze, and slurred speech.  Patient from home where he was LKW at 2000 Last night when going to bed.   Stroke team at the bedside on shortly after patient arrival. Labs drawn and patient cleared for CT by Dr. Eulis Foster. Patient to CT with team. IR called at [phone] and notified of potential patient. NIHSS 17, see documentation for details and code stroke times. Patient with right gaze preference , left hemianopia, left facial droop, left arm weakness, left leg weakness, left decreased sensation and dysarthria  on exam. The following imaging was completed: CT, CTA head and neck, CTP. Patient is not a candidate for tPA due to being outside window.   CT showed a dense right MCA sign per MD. CTA/CTP completed which showed 36 cc of core and 178 cc of penumbra. Code IR Activated. Pt taken directly to [place] [facility]. Intubated by Anesthesia and taken to IR Suite. Handoff given to [place], Therapist, sports. Care/Plan: Admit to ICU.   Some delay noted due to previous patient being on the IR table.   Kathrin Greathouse  Stroke Response RN

## 2020-06-29 NOTE — Progress Notes (Signed)
Patient with multiple bradycardic episodes HR upper 30's--low 40's. Currently maintaining in the 40's. Dr. Smith Robert notified and will see patient in PACU. No additional orders at this time.

## 2020-06-29 NOTE — ED Provider Notes (Signed)
[facility] Provider Note   CSN: [id number] Arrival date & time: 06/29/20  5053  An emergency department physician performed an initial assessment on this suspected stroke patient at 0904.  History Chief Complaint  Patient presents with  . Code Stroke    Tyler Richards is a 69 y.o. male.  HPI Patient seen by me at the [facility] at 9:02 AM.  He presents by EMS for evaluation of left-sided weakness, noticed this morning, last seen normal, yesterday evening at 9 PM.  Patient is able to give history but is moderately confused.  Level 5 caveat-altered mental status    No past medical history on file.  Patient Active Problem List   Diagnosis Date Noted  . Arterial ischemic stroke, MCA, right, acute ([place]) 06/29/2020  . Stroke (cerebrum) ([place]) 06/29/2020  . Acute encephalopathy   . Hypertension   . Acute respiratory failure with hypoxia ([facility])        No family history on file.  Social History   Tobacco Use  . Smoking status: Not on file  Substance Use Topics  . Alcohol use: Not on file  . Drug use: Not on file    Home Medications Prior to Admission medications   Medication Sig Start Date End Date Taking? Authorizing Provider  acetaminophen (TYLENOL) 325 MG tablet Take 650 mg by mouth every 6 (six) hours as needed for mild pain or headache.   Yes [provider]  aspirin EC 81 MG tablet Take 81 mg by mouth daily as needed for mild pain. Swallow whole.   Yes [provider]    Allergies    Patient has no known allergies.  Review of Systems   Review of Systems  Unable to perform ROS: Other    Physical Exam Updated Vital Signs BP (!) 150/64 (BP Location: Left Arm)   Pulse (!) 42   Temp (!) 97.3 F (36.3 C)   Resp 17   Ht 6\' 2"  (1.88 m)   SpO2 100%   Physical Exam Vitals and nursing note reviewed.  Constitutional:      General: He is not in acute distress.    Appearance: He is well-developed. He is not  ill-appearing, toxic-appearing or diaphoretic.     Comments: Elderly, frail  HENT:     Head: Normocephalic and atraumatic.     Right Ear: External ear normal.     Left Ear: External ear normal.  Eyes:     Conjunctiva/sclera: Conjunctivae normal.     Pupils: Pupils are equal, round, and reactive to light.  Neck:     Trachea: Phonation normal.  Cardiovascular:     Rate and Rhythm: Normal rate.  Pulmonary:     Effort: Pulmonary effort is normal. No respiratory distress.     Breath sounds: No stridor. No rhonchi.  Abdominal:     General: There is no distension.     Palpations: Abdomen is soft.  Musculoskeletal:        General: No swelling or tenderness. Normal range of motion.     Cervical back: Normal range of motion and neck supple.  Skin:    General: Skin is warm and dry.  Neurological:     Mental Status: He is alert and oriented to person, place, and time.     Cranial Nerves: No cranial nerve deficit.     Sensory: No sensory deficit.     Motor: No abnormal muscle tone.     Coordination: Coordination normal.  Comments: Mild dysarthria present.  Left-sided neglect present.  Left facial weakness with left arm and left leg weakness.  Psychiatric:        Mood and Affect: Mood normal.        Behavior: Behavior normal.     ED Results / Procedures / Treatments   Labs (all labs ordered are listed, but only abnormal results are displayed) Labs Reviewed  COMPREHENSIVE METABOLIC PANEL - Abnormal; Notable for the following components:      Result Value   CO2 20 (*)    Glucose, Bld 104 (*)    Albumin 3.3 (*)    Total Bilirubin 1.8 (*)    All other components within normal limits  I-STAT CHEM 8, ED - Abnormal; Notable for the following components:   Glucose, Bld 105 (*)    Calcium, Ion 1.12 (*)    All other components within normal limits  RESPIRATORY PANEL BY RT PCR (FLU A&B, COVID)  ETHANOL  PROTIME-INR  APTT  CBC  DIFFERENTIAL  RAPID URINE DRUG SCREEN, HOSP PERFORMED   URINALYSIS, ROUTINE W REFLEX MICROSCOPIC  HIV ANTIBODY (ROUTINE TESTING W REFLEX)  BLOOD GAS, ARTERIAL  BLOOD GAS, ARTERIAL  CBG MONITORING, ED    EKG None  Radiology CT Code Stroke CTA Head W/WO contrast  Result Date: 06/29/2020 CLINICAL DATA:  Hyperdense vessel and stroke-like symptoms EXAM: CT ANGIOGRAPHY HEAD AND NECK CT PERFUSION BRAIN TECHNIQUE: Multidetector CT imaging of the head and neck was performed using the standard protocol during bolus administration of intravenous contrast. Multiplanar CT image reconstructions and MIPs were obtained to evaluate the vascular anatomy. Carotid stenosis measurements (when applicable) are obtained utilizing NASCET criteria, using the distal internal carotid diameter as the denominator. Multiphase CT imaging of the brain was performed following IV bolus contrast injection. Subsequent parametric perfusion maps were calculated using RAPID software. CONTRAST:  Dose is not yet known COMPARISON:  None. FINDINGS: CTA NECK FINDINGS Aortic arch: Mild atheromatous changes. Three vessel branching. No acute finding. Right carotid system: Atherosclerotic plaque about the bifurcation with occluded ICA bulb. No flow seen within the ICA in the neck. Left carotid system: Atheromatous plaque at the bifurcation and bulb. No stenosis or ulceration. Negative for beading. Vertebral arteries: Low-density plaque at the left subclavian origin. No flow limiting subclavian stenosis. Plaque at the left vertebral origin without stenosis, beading, or dissection. Skeleton: Focal advanced C5-6 disc degeneration with ridging causing biforaminal impingement. Ordinary facet osteoarthritis. Other neck: No acute or aggressive finding. Right-sided chronic sinusitis with pattern middle meatus obstruction. Upper chest: Granulomatous nodal calcifications. There is also a calcified granuloma appearance in the left upper lobe. Review of the MIP images confirms the above findings CTA HEAD FINDINGS  Anterior circulation: No flow seen in the right carotid or MCA branches. There is distal M2 M3 branch reconstitution. Calcified plaque along the left carotid siphon. No MCA branch occlusion, aneurysm, or beading. Posterior circulation: The vertebral and basilar arteries are smooth and widely patent. High-grade atheromatous narrowings of the bilateral PCA, P3 segment on the right and upper first order branch on the left. Venous sinuses: Patent as permitted by contrast timing Anatomic variants: None significant Review of the MIP images confirms the above findings CT Brain Perfusion Findings: ASPECTS: 9 CBF (<30%) Volume: 7mL Perfusion (Tmax>6.0s) volume: 127mL Mismatch Volume: 156mL Infarction Location:Right basal ganglia and lesser extensive lateral frontal cortex infarct. Critical Value/emergent results were called by telephone at the time of interpretation on 06/29/2020 at 9:37 am to provider ERIC LINDZEN ,  who verbally acknowledged these results. IMPRESSION: 1. Emergent large vessel occlusion with no flow seen in the right internal carotid or proximal MCA. There is a 36 cc core infarct and 142 cc of penumbra by CT perfusion. 2. Atherosclerosis without flow limiting stenosis of the other major vessels. 3. Bilateral high-grade atheromatous PCA narrowings, more proximal on the right. 4. 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 Electronically Signed   By: Monte Fantasia M.D.   On: 06/29/2020 09:53   CT Code Stroke CTA Neck W/WO contrast  Result Date: 06/29/2020 CLINICAL DATA:  Hyperdense vessel and stroke-like symptoms EXAM: CT ANGIOGRAPHY HEAD AND NECK CT PERFUSION BRAIN TECHNIQUE: Multidetector CT imaging of the head and neck was performed using the standard protocol during bolus administration of intravenous contrast. Multiplanar CT image reconstructions and MIPs were obtained to evaluate the vascular anatomy. Carotid stenosis measurements (when applicable) are obtained  utilizing NASCET criteria, using the distal internal carotid diameter as the denominator. Multiphase CT imaging of the brain was performed following IV bolus contrast injection. Subsequent parametric perfusion maps were calculated using RAPID software. CONTRAST:  Dose is not yet known COMPARISON:  None. FINDINGS: CTA NECK FINDINGS Aortic arch: Mild atheromatous changes. Three vessel branching. No acute finding. Right carotid system: Atherosclerotic plaque about the bifurcation with occluded ICA bulb. No flow seen within the ICA in the neck. Left carotid system: Atheromatous plaque at the bifurcation and bulb. No stenosis or ulceration. Negative for beading. Vertebral arteries: Low-density plaque at the left subclavian origin. No flow limiting subclavian stenosis. Plaque at the left vertebral origin without stenosis, beading, or dissection. Skeleton: Focal advanced C5-6 disc degeneration with ridging causing biforaminal impingement. Ordinary facet osteoarthritis. Other neck: No acute or aggressive finding. Right-sided chronic sinusitis with pattern middle meatus obstruction. Upper chest: Granulomatous nodal calcifications. There is also a calcified granuloma appearance in the left upper lobe. Review of the MIP images confirms the above findings CTA HEAD FINDINGS Anterior circulation: No flow seen in the right carotid or MCA branches. There is distal M2 M3 branch reconstitution. Calcified plaque along the left carotid siphon. No MCA branch occlusion, aneurysm, or beading. Posterior circulation: The vertebral and basilar arteries are smooth and widely patent. High-grade atheromatous narrowings of the bilateral PCA, P3 segment on the right and upper first order branch on the left. Venous sinuses: Patent as permitted by contrast timing Anatomic variants: None significant Review of the MIP images confirms the above findings CT Brain Perfusion Findings: ASPECTS: 9 CBF (<30%) Volume: 73mL Perfusion (Tmax>6.0s) volume: 146mL  Mismatch Volume: 123mL Infarction Location:Right basal ganglia and lesser extensive lateral frontal cortex infarct. Critical Value/emergent results were called by telephone at the time of interpretation on 06/29/2020 at 9:37 am to provider ERIC [facility] , who verbally acknowledged these results. IMPRESSION: 1. Emergent large vessel occlusion with no flow seen in the right internal carotid or proximal MCA. There is a 36 cc core infarct and 142 cc of penumbra by CT perfusion. 2. Atherosclerosis without flow limiting stenosis of the other major vessels. 3. Bilateral high-grade atheromatous PCA narrowings, more proximal on the right. 4. 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 Electronically Signed   By: Monte Fantasia M.D.   On: 06/29/2020 09:53   CT Code Stroke Cerebral Perfusion with contrast  Result Date: 06/29/2020 CLINICAL DATA:  Hyperdense vessel and stroke-like symptoms EXAM: CT ANGIOGRAPHY HEAD AND NECK CT PERFUSION BRAIN TECHNIQUE: Multidetector CT imaging of the head and neck was performed using the standard protocol  during bolus administration of intravenous contrast. Multiplanar CT image reconstructions and MIPs were obtained to evaluate the vascular anatomy. Carotid stenosis measurements (when applicable) are obtained utilizing NASCET criteria, using the distal internal carotid diameter as the denominator. Multiphase CT imaging of the brain was performed following IV bolus contrast injection. Subsequent parametric perfusion maps were calculated using RAPID software. CONTRAST:  Dose is not yet known COMPARISON:  None. FINDINGS: CTA NECK FINDINGS Aortic arch: Mild atheromatous changes. Three vessel branching. No acute finding. Right carotid system: Atherosclerotic plaque about the bifurcation with occluded ICA bulb. No flow seen within the ICA in the neck. Left carotid system: Atheromatous plaque at the bifurcation and bulb. No stenosis or ulceration. Negative for beading.  Vertebral arteries: Low-density plaque at the left subclavian origin. No flow limiting subclavian stenosis. Plaque at the left vertebral origin without stenosis, beading, or dissection. Skeleton: Focal advanced C5-6 disc degeneration with ridging causing biforaminal impingement. Ordinary facet osteoarthritis. Other neck: No acute or aggressive finding. Right-sided chronic sinusitis with pattern middle meatus obstruction. Upper chest: Granulomatous nodal calcifications. There is also a calcified granuloma appearance in the left upper lobe. Review of the MIP images confirms the above findings CTA HEAD FINDINGS Anterior circulation: No flow seen in the right carotid or MCA branches. There is distal M2 M3 branch reconstitution. Calcified plaque along the left carotid siphon. No MCA branch occlusion, aneurysm, or beading. Posterior circulation: The vertebral and basilar arteries are smooth and widely patent. High-grade atheromatous narrowings of the bilateral PCA, P3 segment on the right and upper first order branch on the left. Venous sinuses: Patent as permitted by contrast timing Anatomic variants: None significant Review of the MIP images confirms the above findings CT Brain Perfusion Findings: ASPECTS: 9 CBF (<30%) Volume: 41mL Perfusion (Tmax>6.0s) volume: 138mL Mismatch Volume: 149mL Infarction Location:Right basal ganglia and lesser extensive lateral frontal cortex infarct. Critical Value/emergent results were called by telephone at the time of interpretation on 06/29/2020 at 9:37 am to provider ERIC [facility] , who verbally acknowledged these results. IMPRESSION: 1. Emergent large vessel occlusion with no flow seen in the right internal carotid or proximal MCA. There is a 36 cc core infarct and 142 cc of penumbra by CT perfusion. 2. Atherosclerosis without flow limiting stenosis of the other major vessels. 3. Bilateral high-grade atheromatous PCA narrowings, more proximal on the right. 4. Incidental chronic  right-sided sinusitis from middle meatus obstruction. Electronically Signed   By: Monte Fantasia M.D.   On: 06/29/2020 09:53   DG CHEST PORT 1 VIEW  Result Date: 06/29/2020 CLINICAL DATA:  Code stroke with coiling intubated EXAM: PORTABLE CHEST 1 VIEW COMPARISON:  None. FINDINGS: Endotracheal tube tip is about 4.2 cm superior to the carina. Esophageal tube tip is below the diaphragm but incompletely visualized. No focal opacity or pleural effusion. Normal cardiac size. No pneumothorax. IMPRESSION: Endotracheal tube tip about 4.2 cm superior to the carina. Lungs grossly clear Electronically Signed   By: Donavan Foil M.D.   On: 06/29/2020 16:00   CT HEAD CODE STROKE WO CONTRAST  Result Date: 06/29/2020 CLINICAL DATA:  Code stroke.  Slurred speech and left facial droop EXAM: CT HEAD WITHOUT CONTRAST TECHNIQUE: Contiguous axial images were obtained from the base of the skull through the vertex without intravenous contrast. COMPARISON:  None. FINDINGS: Brain: Small remote appearing cortically based infarcts in the superior right frontal lobe. No hemorrhage, hydrocephalus, or masslike finding. Vascular: Hyperdense distal right ICA to MCA branches. Atherosclerotic calcification. Skull: Negative Sinuses/Orbits: Right maxillary,  ethmoid, and frontal sinus opacification with sclerotic wall thickening at the maxillary sinus. Other: Critical Value/emergent results were called by telephone at the time of interpretation on 06/29/2020 at 9:23 am to provider Lindzen , who verbally acknowledged these results. ASPECTS [facility] Stroke Program Early CT Score) - Ganglionic level infarction (caudate, lentiform nuclei, internal capsule, insula, M1-M3 cortex): Posterior putamen appears blunted compared to the left. Equivocal for small insular cortex infarct. - Supraganglionic infarction (M4-M6 cortex): 3, when accounting for chronic infarct Total score (0-10 with 10 being normal): 9, when accounting for chronic infarct IMPRESSION:  1. Hyperdense distal right ICA and proximal MCA. 2. Blunted appearance of the posterior right putamen. Chronic right high frontal cortex infarcts. ASPECTS is 9 when excluding the chronic changes. Electronically Signed   By: Monte Fantasia M.D.   On: 06/29/2020 09:25    Procedures Procedures (including critical care time)  Medications Ordered in ED Medications  aspirin 81 MG chewable tablet (has no administration in time range)  ticagrelor (BRILINTA) 90 MG tablet (has no administration in time range)  nitroGLYCERIN 100 mcg/mL intra-arterial injection (has no administration in time range)  ceFAZolin (ANCEF) 2-4 GM/100ML-% IVPB (has no administration in time range)  aspirin tablet (81 mg Per Tube Given 06/29/20 1044)  ticagrelor (BRILINTA) tablet (180 mg Per Tube Given 06/29/20 1044)   stroke: mapping our early stages of recovery book (has no administration in time range)  acetaminophen (TYLENOL) tablet 650 mg (has no administration in time range)    Or  acetaminophen (TYLENOL) 160 MG/5ML solution 650 mg (has no administration in time range)    Or  acetaminophen (TYLENOL) suppository 650 mg (has no administration in time range)  senna-docusate (Senokot-S) tablet 1 tablet (has no administration in time range)  cangrelor (KENGREAL) 50 MG SOLR (has no administration in time range)  cangrelor [facility]) bolus via infusion (818.1 mcg Intravenous Given 06/29/20 1313)  0.9 %  sodium chloride infusion (has no administration in time range)  clevidipine (CLEVIPREX) infusion 0.5 mg/mL (0 mg/hr Intravenous Not Given 06/29/20 1445)  pantoprazole (PROTONIX) injection 40 mg (has no administration in time range)  docusate (COLACE) 50 MG/5ML liquid 100 mg (has no administration in time range)  polyethylene glycol (MIRALAX / GLYCOLAX) packet 17 g (has no administration in time range)  fentaNYL (SUBLIMAZE) injection 25 mcg (has no administration in time range)  fentaNYL (SUBLIMAZE) injection 25-100 mcg (has no  administration in time range)  propofol (DIPRIVAN) 1000 MG/100ML infusion (40 mcg/kg/min Intravenous Rate/Dose Change 06/29/20 1500)  0.9 %  sodium chloride infusion (has no administration in time range)  norepinephrine (LEVOPHED) 4mg  in 259mL premix infusion (has no administration in time range)  iohexol (OMNIPAQUE) 350 MG/ML injection 100 mL (100 mLs Intravenous Contrast Given 06/29/20 0931)  fentaNYL (SUBLIMAZE) 100 MCG/2ML injection (  Override pull for Anesthesia 06/29/20 1045)  iohexol (OMNIPAQUE) 300 MG/ML solution 150 mL (75 mLs Intravenous Contrast Given 06/29/20 1250)  iohexol (OMNIPAQUE) 300 MG/ML solution 150 mL (75 mLs Intravenous Contrast Given 06/29/20 1251)  iohexol (OMNIPAQUE) 300 MG/ML solution 50 mL (25 mLs Intravenous Contrast Given 06/29/20 1251)    ED Course  I have reviewed the triage vital signs and the nursing notes.  Pertinent labs & imaging results that were available during my care of the patient were reviewed by me and considered in my medical decision making (see chart for details).    MDM Rules/Calculators/A&P  Patient Vitals for the past 24 hrs:  BP Temp Temp src Pulse Resp SpO2 Height  06/29/20 1630 (!) 150/64 -- -- (!) 42 17 100 % --  06/29/20 1615 (!) 159/64 -- -- (!) 41 15 100 % --  06/29/20 1600 (!) 147/66 -- -- (!) 46 16 100 % --  06/29/20 1545 (!) 141/74 -- -- (!) 40 15 100 % --  06/29/20 1530 (!) 145/72 -- -- 64 15 100 % --  06/29/20 1515 (!) 150/76 -- -- 65 15 100 % --  06/29/20 1500 128/75 -- -- 80 15 100 % --  06/29/20 1445 (!) 143/84 (!) 97.3 F (36.3 C) -- 72 15 100 % --  06/29/20 1431 -- -- -- 70 15 100 % 6\' 2"  (1.88 m)  06/29/20 1430 (!) 153/79 -- -- 86 19 100 % --  06/29/20 0940 (!) 132/53 98.3 F (36.8 C) Oral 62 -- 100 % --      Medical Decision Making:  This patient is presenting for evaluation of weakness, which does require a range of treatment options, and is a complaint that involves a high risk of  morbidity and mortality. The differential diagnoses include CVA, TIA, chronic weakness. I decided to review old records, and in summary anemia, found to be having new left-sided weakness today..  I did not require additional historical information from anyone.  Patient evaluated initially by neuro hospitalist who assumed care and disposition planning.    Critical Interventions-clinical evaluation, clearance for CT imaging of the head  After These Interventions, the Patient was reevaluated and was found to require hospitalization for further treatment and management.  CRITICAL CARE-no Performed by: Daleen Bo  Nursing Notes Reviewed/ Care Coordinated Applicable Imaging Reviewed Interpretation of Laboratory Data incorporated into ED treatment   Plan and disposition by neuro hospitalist team    Final Clinical Impression(s) / ED Diagnoses Final diagnoses:  Stroke [facility])  Respiratory failure ([place])  Intubation of airway performed without difficulty    Rx / DC Orders ED Discharge Orders    None       Daleen Bo, MD 06/29/20 1652

## 2020-06-29 NOTE — Procedures (Signed)
S/P bilateral common carotid artrriogram followed by revascularization of occluded Rt MCA with  x1 pass with 51mmx 40 mm solitaireX retriever,x 1 pass with the [employer] retriever both with aspiration x 2 passes with aspiration achieving a TICI 2C revascularization . S/P Stent assisted angioplasty of symptomatic RT ICA prox with revascularization. 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 Patient loaded with 81 mg aspirin and 180 mg of brilinta via orogastric tube and 3/4 (9 ccbolus dose) of cangrelor.  CT Brain x 2 RT basal ganglia contrast stain with  2 focal areas of hemorrhage. Therefore cangrelor IV fusion not given 62F exoseal used for hemostasis in the Rt groin. Distal pulses all present. Patient left intubated to protect airway as patient not fully responsive on attempted reversal. Both pupils 3 mm equal and sluggish.  S.Hakeen Shipes MD.

## 2020-06-29 NOTE — Anesthesia Postprocedure Evaluation (Signed)
Anesthesia Post Note  Patient: Tyler Richards  Procedure(s) Performed: IR WITH ANESTHESIA (N/A )     Patient location during evaluation: SICU Anesthesia Type: General Level of consciousness: sedated Pain management: pain level controlled Vital Signs Assessment: post-procedure vital signs reviewed and stable Respiratory status: patient remains intubated per anesthesia plan Cardiovascular status: stable Postop Assessment: no apparent nausea or vomiting Anesthetic complications: no Comments: Awaiting bed availability in ICU.   Intermittent episodes of bradycardia (40's) with stable BP. Notified ICU MD. Considering possible Cardiology consult.  RN given order for glycopyrrolate as temporizing measure if HR < 40 until ICU/Cardiology work up. Will consider dobutamine or NE if BP drops.    No complications documented.  Last Vitals:  Vitals:   06/29/20 1545 06/29/20 1600  BP: (!) 141/74 (!) 147/66  Pulse: (!) 40 (!) 46  Resp: 15 16  Temp:    SpO2: 100% 100%    Last Pain:  Vitals:   06/29/20 0940  TempSrc: Oral    LLE Motor Response: No movement to painful stimulus (06/29/20 1600)   RLE Motor Response: Non-purposeful movement (06/29/20 1600)        Effie Berkshire

## 2020-06-29 NOTE — Progress Notes (Signed)
Transported pt from 4N 30 to CT and back with no complications.

## 2020-06-29 NOTE — Progress Notes (Signed)
Transported pt from PACU to [id number] without complication.

## 2020-06-29 NOTE — Anesthesia Procedure Notes (Signed)
Procedure Name: Intubation Date/Time: 06/29/2020 9:41 AM Performed by: Janene Harvey, CRNA Pre-anesthesia Checklist: Patient identified, Emergency Drugs available, Suction available and Patient being monitored Patient Re-evaluated:Patient Re-evaluated prior to induction Oxygen Delivery Method: Circle system utilized Preoxygenation: Pre-oxygenation with 100% oxygen Induction Type: IV induction, Rapid sequence and Cricoid Pressure applied Laryngoscope Size: Glidescope and 3 Grade View: Grade I Tube type: Oral Tube size: 7.5 mm Number of attempts: 1 Airway Equipment and Method: Stylet and Oral airway Placement Confirmation: ETT inserted through vocal cords under direct vision,  positive ETCO2 and breath sounds checked- equal and bilateral Secured at: 22 cm Tube secured with: Tape Dental Injury: Teeth and Oropharynx as per pre-operative assessment  Comments: Elective glidescope. Code Stroke patient

## 2020-06-30 ENCOUNTER — Inpatient Hospital Stay (HOSPITAL_COMMUNITY): Payer: Medicare Other

## 2020-06-30 ENCOUNTER — Encounter (HOSPITAL_COMMUNITY): Payer: Self-pay | Admitting: Radiology

## 2020-06-30 DIAGNOSIS — I639 Cerebral infarction, unspecified: Secondary | ICD-10-CM

## 2020-06-30 DIAGNOSIS — I6389 Other cerebral infarction: Secondary | ICD-10-CM

## 2020-06-30 DIAGNOSIS — I63511 Cerebral infarction due to unspecified occlusion or stenosis of right middle cerebral artery: Secondary | ICD-10-CM

## 2020-06-30 DIAGNOSIS — I63231 Cerebral infarction due to unspecified occlusion or stenosis of right carotid arteries: Secondary | ICD-10-CM

## 2020-06-30 LAB — HIV ANTIBODY (ROUTINE TESTING W REFLEX): HIV Screen 4th Generation wRfx: NONREACTIVE

## 2020-06-30 LAB — ECHOCARDIOGRAM COMPLETE
Area-P 1/2: 2.8 cm2
Height: 74 in
S' Lateral: 3.8 cm
Weight: 3001.78 oz

## 2020-06-30 LAB — HEMOGLOBIN A1C
Hgb A1c MFr Bld: 5.6 % (ref 4.8–5.6)
Mean Plasma Glucose: 114.02 mg/dL

## 2020-06-30 LAB — LIPID PANEL
Cholesterol: 149 mg/dL (ref 0–200)
HDL: 23 mg/dL — ABNORMAL LOW (ref >40)
LDL Cholesterol: 93 mg/dL (ref 0–99)
Total CHOL/HDL Ratio: 6.5 RATIO
Triglycerides: 167 mg/dL — ABNORMAL HIGH (ref <150)
VLDL: 33 mg/dL (ref 0–40)

## 2020-06-30 LAB — CBC WITH DIFFERENTIAL/PLATELET
Abs Immature Granulocytes: 0.02 10*3/uL (ref 0.00–0.07)
Basophils Absolute: 0 10*3/uL (ref 0.0–0.1)
Basophils Relative: 0 %
Eosinophils Absolute: 0 10*3/uL (ref 0.0–0.5)
Eosinophils Relative: 0 %
HCT: 36.2 % — ABNORMAL LOW (ref 39.0–52.0)
Hemoglobin: 12.2 g/dL — ABNORMAL LOW (ref 13.0–17.0)
Immature Granulocytes: 0 %
Lymphocytes Relative: 11 %
Lymphs Abs: 1.1 10*3/uL (ref 0.7–4.0)
MCH: 32.7 pg (ref 26.0–34.0)
MCHC: 33.7 g/dL (ref 30.0–36.0)
MCV: 97.1 fL (ref 80.0–100.0)
Monocytes Absolute: 1.4 10*3/uL — ABNORMAL HIGH (ref 0.1–1.0)
Monocytes Relative: 14 %
Neutro Abs: 7.2 10*3/uL (ref 1.7–7.7)
Neutrophils Relative %: 75 %
Platelets: 380 10*3/uL (ref 150–400)
RBC: 3.73 MIL/uL — ABNORMAL LOW (ref 4.22–5.81)
RDW: 12.6 % (ref 11.5–15.5)
WBC: 9.7 10*3/uL (ref 4.0–10.5)
nRBC: 0 % (ref 0.0–0.2)

## 2020-06-30 LAB — BASIC METABOLIC PANEL
Anion gap: 12 (ref 5–15)
BUN: 10 mg/dL (ref 8–23)
CO2: 20 mmol/L — ABNORMAL LOW (ref 22–32)
Calcium: 8.4 mg/dL — ABNORMAL LOW (ref 8.9–10.3)
Chloride: 106 mmol/L (ref 98–111)
Creatinine, Ser: 0.89 mg/dL (ref 0.61–1.24)
GFR calc Af Amer: >60 mL/min (ref >60)
GFR calc non Af Amer: >60 mL/min (ref >60)
Glucose, Bld: 113 mg/dL — ABNORMAL HIGH (ref 70–99)
Potassium: 3.9 mmol/L (ref 3.5–5.1)
Sodium: 138 mmol/L (ref 135–145)

## 2020-06-30 LAB — TRIGLYCERIDES: Triglycerides: 168 mg/dL — ABNORMAL HIGH (ref <150)

## 2020-06-30 MED ORDER — DOPAMINE-DEXTROSE 3.2-5 MG/ML-% IV SOLN: 0.0000 ug/kg/min | INTRAVENOUS | Status: DC

## 2020-06-30 MED ORDER — DOPAMINE-DEXTROSE 3.2-5 MG/ML-% IV SOLN
5.0000 ug/kg/min | INTRAVENOUS | Status: DC
  Administered 2020-06-30: 2.5 ug/kg/min via INTRAVENOUS
  Filled 2020-06-30: qty 250

## 2020-06-30 MED ORDER — PERFLUTREN LIPID MICROSPHERE
1.0000 mL | INTRAVENOUS | Status: DC | PRN
  Administered 2020-06-30: 3 mL via INTRAVENOUS
  Filled 2020-06-30: qty 10

## 2020-06-30 NOTE — Consult Note (Signed)
Physical Medicine and Rehabilitation Consult Reason for Consult: Left side weakness and slurred speech Referring Physician: Dr.Xu   HPI: Tyler Richards is a 69 y.o. right-handed male with unremarkable past medical history on no prescription medications.  Per chart review lives with spouse independent and active prior to admission 1 level home 2 steps to entry.  Presented 06/29/2020 with acute onset of left-sided weakness and slurred speech.  Cranial CT scan showed hyperdense distal right ICA and proximal MCA.  Blunted appearance of the posterior right putamen.  Chronic right high frontal cortex infarct.  Patient did not receive TPA.  CT angiogram of head and neck emergent large vessel occlusion with no flow seen in the right internal carotid or proximal MCA.  Patient underwent right MCA thrombectomy and right ICA stent placement 06/29/2020 per interventional radiology.  Most recent MRI and imaging revealed acute infarct right basal ganglia with nonprogressive hemorrhage when correlated with a prior CT 06/29/2020.  Lower extremity Doppler showed no signs of DVT.  Carotid Dopplers with 1 to 39% ICA stenosis.  Echocardiogram with ejection fraction of 50 to 24% grade 1 diastolic dysfunction.  Admission chemistries were unremarkable aside glucose 104, urine drug screen negative.  Currently maintained on aspirin for CVA prophylaxis as well as Brilinta.  Patient was extubated 06/30/2020.  Therapy evaluations completed 06/30/2020 with recommendations of physical medicine rehab consult.   Review of Systems  Constitutional: Negative for chills and fever.  HENT: Negative for hearing loss.   Eyes: Negative for blurred vision and double vision.  Respiratory: Negative for cough and shortness of breath.   Cardiovascular: Negative for chest pain, palpitations and leg swelling.  Gastrointestinal: Positive for constipation. Negative for heartburn, nausea and vomiting.  Genitourinary: Negative for dysuria, flank  pain and hematuria.  Musculoskeletal: Positive for myalgias.  Skin: Negative for rash.  Neurological: Positive for speech change and weakness.  All other systems reviewed and are negative.  History reviewed. No pertinent past medical history. Past Surgical History:  Procedure Laterality Date  . RADIOLOGY WITH ANESTHESIA N/A 06/29/2020   Procedure: IR WITH ANESTHESIA;  Surgeon: Radiologist, Medication, MD;  Location: [facility];  Service: Radiology;  Laterality: N/A;   History reviewed. No pertinent family history. Social History:  reports that he has never smoked. He has never used smokeless tobacco. He reports previous alcohol use. He reports that he does not use drugs. Allergies: No Known Allergies Medications Prior to Admission  Medication Sig Dispense Refill  . acetaminophen (TYLENOL) 325 MG tablet Take 650 mg by mouth every 6 (six) hours as needed for mild pain or headache.    Marland Kitchen aspirin EC 81 MG tablet Take 81 mg by mouth daily as needed for mild pain. Swallow whole.      Home: Home Living Family/patient expects to be discharged to:: Private residence (modular home) Living Arrangements: Spouse/significant other Available Help at Discharge: Family, Available 24 hours/day Type of Home: House Home Access: Stairs to enter [employer] of Steps: 2 Entrance Stairs-Rails: Right Home Layout: One level Bathroom Shower/Tub: Chiropodist: [place] - single point, Environmental consultant - 2 wheels, Wheelchair - manual  Functional History: Prior Function Level of Independence: Independent Comments: Independent for ADLs, walking. Drives. Functional Status:  Mobility: Bed Mobility Overal bed mobility: Needs Assistance Bed Mobility: Rolling, Sidelying to Sit Rolling: Mod assist, +2 for physical assistance Sidelying to sit: Max assist, +2 for physical assistance, HOB elevated General bed mobility comments:  Step by step cues for  sequencing; to push through RLE to scoot bottom, assist with LLE and elevate trunk with cues to reach for rail Transfers Overall transfer level: Needs assistance Equipment used: 2 person hand held assist Transfers: Sit to/from Stand Sit to Stand: Max assist, +2 physical assistance General transfer comment: Assist of 2 to power to standing with cues for anterior translation. Flexed posture and not able to extend through trunk/hips despite cues. Ambulation/Gait General Gait Details: Unable    ADL:    Cognition: Cognition Overall Cognitive Status: Impaired/Different from baseline Orientation Level: Intubated/Tracheostomy - Unable to assess Cognition Arousal/Alertness: Awake/alert Behavior During Therapy: WFL for tasks assessed/performed Overall Cognitive Status: Impaired/Different from baseline Area of Impairment: Problem solving, Attention, Following commands Current Attention Level: Sustained Following Commands: Follows one step commands with increased time, Follows multi-step commands inconsistently Problem Solving: Slow processing, Decreased initiation, Difficulty sequencing, Requires verbal cues, Requires tactile cues General Comments: A&Ox4; requires repetition and step by step cues for sequencing at times. Left inattention. Difficulty reading clock.  Blood pressure (!) 158/57, pulse 61, temperature 99.2 F (37.3 C), temperature source Oral, resp. rate 16, height 6\' 2"  (1.88 m), weight 85.1 kg, SpO2 100 %. Physical Exam General: Alert and oriented x 3, No apparent distress HEENT: Head is normocephalic, atraumatic, PERRLA, EOMI, sclera anicteric, oral mucosa pink and moist, dentition intact, ext ear canals clear,  Neck: Supple without JVD or lymphadenopathy Heart: Reg rate and rhythm. No murmurs rubs or gallops Chest: +cough, breathing comfortably Abdomen: Soft, non-tender, non-distended, bowel sounds positive. Extremities: No clubbing, cyanosis, or edema. Pulses are 2+ Skin:  Clean and intact without signs of breakdown Neuro: Alert and oriented x3. Also able to state birthday, reason he is here. Patient is a bit lethargic but arousable.  Left-sided inattention.  Follows simple commands.  Examination overall limited due to lethargy. Right sided strength 4/5. LU 1 SA and 0/5 distally Musculoskeletal: Full ROM, No pain with AROM or PROM in the neck, trunk, or extremities. Posture appropriate Psych: [name] affect is appropriate. Pt is cooperative   Results for orders placed or performed during the hospital encounter of 06/29/20 (from the past 24 hour(s))  MRSA PCR Screening     Status: None   Collection Time: 06/29/20  5:28 PM   Specimen: Nasal Mucosa; Nasopharyngeal  Result Value Ref Range   MRSA by PCR NEGATIVE NEGATIVE  I-STAT 7, (LYTES, BLD GAS, ICA, H+H)     Status: Abnormal   Collection Time: 06/29/20  5:38 PM  Result Value Ref Range   pH, Arterial 7.380 7.35 - 7.45   pCO2 arterial 41.3 32 - 48 mmHg   pO2, Arterial 252 (H) 83 - 108 mmHg   Bicarbonate 24.4 20.0 - 28.0 mmol/L   TCO2 26 22 - 32 mmol/L   O2 Saturation 100.0 %   Acid-base deficit 1.0 0.0 - 2.0 mmol/L   Sodium 138 135 - 145 mmol/L   Potassium 3.8 3.5 - 5.1 mmol/L   Calcium, Ion 1.21 1.15 - 1.40 mmol/L   HCT 35.0 (L) 39 - 52 %   Hemoglobin 11.9 (L) 13.0 - 17.0 g/dL   Collection site Radial    Drawn by RT    Sample type ARTERIAL   Urine rapid drug screen (hosp performed)     Status: None   Collection Time: 06/29/20  5:49 PM  Result Value Ref Range   Opiates NONE DETECTED NONE DETECTED   Cocaine NONE DETECTED NONE DETECTED   Benzodiazepines NONE DETECTED  NONE DETECTED   Amphetamines NONE DETECTED NONE DETECTED   Tetrahydrocannabinol NONE DETECTED NONE DETECTED   Barbiturates NONE DETECTED NONE DETECTED  Urinalysis, Routine w reflex microscopic Urine, Catheterized     Status: Abnormal   Collection Time: 06/29/20  5:49 PM  Result Value Ref Range   Color, Urine YELLOW YELLOW   APPearance  CLEAR CLEAR   Specific Gravity, Urine 1.044 (H) 1.005 - 1.030   pH 5.0 5.0 - 8.0   Glucose, UA NEGATIVE NEGATIVE mg/dL   Hgb urine dipstick MODERATE (A) NEGATIVE   Bilirubin Urine NEGATIVE NEGATIVE   Ketones, ur NEGATIVE NEGATIVE mg/dL   Protein, ur NEGATIVE NEGATIVE mg/dL   Nitrite NEGATIVE NEGATIVE   Leukocytes,Ua NEGATIVE NEGATIVE   RBC / HPF 0-5 0 - 5 RBC/hpf   WBC, UA 0-5 0 - 5 WBC/hpf   Bacteria, UA RARE (A) NONE SEEN   Squamous Epithelial / LPF 0-5 0 - 5   Mucus PRESENT   HIV Antibody (routine testing w rflx)     Status: None   Collection Time: 06/30/20 12:43 AM  Result Value Ref Range   HIV Screen 4th Generation wRfx Non Reactive Non Reactive  Hemoglobin A1c     Status: None   Collection Time: 06/30/20 12:43 AM  Result Value Ref Range   Hgb A1c MFr Bld 5.6 4.8 - 5.6 %   Mean Plasma Glucose 114.02 mg/dL  Lipid panel     Status: Abnormal   Collection Time: 06/30/20 12:43 AM  Result Value Ref Range   Cholesterol 149 0 - 200 mg/dL   Triglycerides 167 (H) <150 mg/dL   HDL 23 (L) >40 mg/dL   Total CHOL/HDL Ratio 6.5 RATIO   VLDL 33 0 - 40 mg/dL   LDL Cholesterol 93 0 - 99 mg/dL  Triglycerides     Status: Abnormal   Collection Time: 06/30/20 12:43 AM  Result Value Ref Range   Triglycerides 168 (H) <150 mg/dL  CBC with Differential/Platelet     Status: Abnormal   Collection Time: 06/30/20 12:43 AM  Result Value Ref Range   WBC 9.7 4.0 - 10.5 K/uL   RBC 3.73 (L) 4.22 - 5.81 MIL/uL   Hemoglobin 12.2 (L) 13.0 - 17.0 g/dL   HCT 36.2 (L) 39 - 52 %   MCV 97.1 80.0 - 100.0 fL   MCH 32.7 26.0 - 34.0 pg   MCHC 33.7 30.0 - 36.0 g/dL   RDW 12.6 11.5 - 15.5 %   Platelets 380 150 - 400 K/uL   nRBC 0.0 0.0 - 0.2 %   Neutrophils Relative % 75 %   Neutro Abs 7.2 1.7 - 7.7 K/uL   Lymphocytes Relative 11 %   Lymphs Abs 1.1 0.7 - 4.0 K/uL   Monocytes Relative 14 %   Monocytes Absolute 1.4 (H) 0 - 1 K/uL   Eosinophils Relative 0 %   Eosinophils Absolute 0.0 0 - 0 K/uL    Basophils Relative 0 %   Basophils Absolute 0.0 0 - 0 K/uL   Immature Granulocytes 0 %   Abs Immature Granulocytes 0.02 0.00 - 0.07 K/uL  Basic metabolic panel     Status: Abnormal   Collection Time: 06/30/20 12:43 AM  Result Value Ref Range   Sodium 138 135 - 145 mmol/L   Potassium 3.9 3.5 - 5.1 mmol/L   Chloride 106 98 - 111 mmol/L   CO2 20 (L) 22 - 32 mmol/L   Glucose, Bld 113 (H) 70 - 99 mg/dL  BUN 10 8 - 23 mg/dL   Creatinine, Ser 0.89 0.61 - 1.24 mg/dL   Calcium 8.4 (L) 8.9 - 10.3 mg/dL   GFR calc non Af Amer >60 >60 mL/min   GFR calc Af Amer >60 >60 mL/min   Anion gap 12 5 - 15   CT Code Stroke CTA Head W/WO contrast  Result Date: 06/29/2020 CLINICAL DATA:  Hyperdense vessel and stroke-like symptoms EXAM: CT ANGIOGRAPHY HEAD AND NECK CT PERFUSION BRAIN TECHNIQUE: Multidetector CT imaging of the head and neck was performed using the standard protocol during bolus administration of intravenous contrast. Multiplanar CT image reconstructions and MIPs were obtained to evaluate the vascular anatomy. Carotid stenosis measurements (when applicable) are obtained utilizing NASCET criteria, using the distal internal carotid diameter as the denominator. Multiphase CT imaging of the brain was performed following IV bolus contrast injection. Subsequent parametric perfusion maps were calculated using RAPID software. CONTRAST:  Dose is not yet known COMPARISON:  None. FINDINGS: CTA NECK FINDINGS Aortic arch: Mild atheromatous changes. Three vessel branching. No acute finding. Right carotid system: Atherosclerotic plaque about the bifurcation with occluded ICA bulb. No flow seen within the ICA in the neck. Left carotid system: Atheromatous plaque at the bifurcation and bulb. No stenosis or ulceration. Negative for beading. Vertebral arteries: Low-density plaque at the left subclavian origin. No flow limiting subclavian stenosis. Plaque at the left vertebral origin without stenosis, beading, or  dissection. Skeleton: Focal advanced C5-6 disc degeneration with ridging causing biforaminal impingement. Ordinary facet osteoarthritis. Other neck: No acute or aggressive finding. Right-sided chronic sinusitis with pattern middle meatus obstruction. Upper chest: Granulomatous nodal calcifications. There is also a calcified granuloma appearance in the left upper lobe. Review of the MIP images confirms the above findings CTA HEAD FINDINGS Anterior circulation: No flow seen in the right carotid or MCA branches. There is distal M2 M3 branch reconstitution. Calcified plaque along the left carotid siphon. No MCA branch occlusion, aneurysm, or beading. Posterior circulation: The vertebral and basilar arteries are smooth and widely patent. High-grade atheromatous narrowings of the bilateral PCA, P3 segment on the right and upper first order branch on the left. Venous sinuses: Patent as permitted by contrast timing Anatomic variants: None significant Review of the MIP images confirms the above findings CT Brain Perfusion Findings: ASPECTS: 9 CBF (<30%) Volume: 5mL Perfusion (Tmax>6.0s) volume: 165mL Mismatch Volume: 138mL Infarction Location:Right basal ganglia and lesser extensive lateral frontal cortex infarct. Critical Value/emergent results were called by telephone at the time of interpretation on 06/29/2020 at 9:37 am to provider ERIC [facility] , who verbally acknowledged these results. IMPRESSION: 1. Emergent large vessel occlusion with no flow seen in the right internal carotid or proximal MCA. There is a 36 cc core infarct and 142 cc of penumbra by CT perfusion. 2. Atherosclerosis without flow limiting stenosis of the other major vessels. 3. Bilateral high-grade atheromatous PCA narrowings, more proximal on the right. 4. 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 Electronically Signed   By: Monte Fantasia M.D.   On: 06/29/2020 09:53   CT HEAD WO CONTRAST  Result Date:  06/29/2020 CLINICAL DATA:  Follow-up stroke and subsequent intervention. EXAM: CT HEAD WITHOUT CONTRAST TECHNIQUE: Contiguous axial images were obtained from the base of the skull through the vertex without intravenous contrast. COMPARISON:  Earlier same day FINDINGS: Brain: 3.9 x 3.0 x 3.0 cm hyperdense region in the right posterior basal ganglia most consistent with a parenchymal hematoma, given small internal fluid levels. Mild mass effect and  surrounding edema. No midline shift. Chronic ischemic changes in the right posterior frontal region as seen previously. Vascular: No other new vascular finding. Skull: Negative Sinuses/Orbits: Opacified right maxillary and ethmoid sinuses. Orbits negative. Other: None IMPRESSION: 1. 3.9 x 3.0 x 3.0 cm hyperdense region in the right posterior basal ganglia most consistent with parenchymal hematoma, given small internal fluid levels. Mild mass effect and surrounding edema. No midline shift. 2. Chronic ischemic changes in the right posterior frontal region as seen previously. 3. Opacified right maxillary and ethmoid sinuses. Electronically Signed   By: Nelson Chimes M.D.   On: 06/29/2020 18:48   CT Code Stroke CTA Neck W/WO contrast  Result Date: 06/29/2020 CLINICAL DATA:  Hyperdense vessel and stroke-like symptoms EXAM: CT ANGIOGRAPHY HEAD AND NECK CT PERFUSION BRAIN TECHNIQUE: Multidetector CT imaging of the head and neck was performed using the standard protocol during bolus administration of intravenous contrast. Multiplanar CT image reconstructions and MIPs were obtained to evaluate the vascular anatomy. Carotid stenosis measurements (when applicable) are obtained utilizing NASCET criteria, using the distal internal carotid diameter as the denominator. Multiphase CT imaging of the brain was performed following IV bolus contrast injection. Subsequent parametric perfusion maps were calculated using RAPID software. CONTRAST:  Dose is not yet known COMPARISON:  None.  FINDINGS: CTA NECK FINDINGS Aortic arch: Mild atheromatous changes. Three vessel branching. No acute finding. Right carotid system: Atherosclerotic plaque about the bifurcation with occluded ICA bulb. No flow seen within the ICA in the neck. Left carotid system: Atheromatous plaque at the bifurcation and bulb. No stenosis or ulceration. Negative for beading. Vertebral arteries: Low-density plaque at the left subclavian origin. No flow limiting subclavian stenosis. Plaque at the left vertebral origin without stenosis, beading, or dissection. Skeleton: Focal advanced C5-6 disc degeneration with ridging causing biforaminal impingement. Ordinary facet osteoarthritis. Other neck: No acute or aggressive finding. Right-sided chronic sinusitis with pattern middle meatus obstruction. Upper chest: Granulomatous nodal calcifications. There is also a calcified granuloma appearance in the left upper lobe. Review of the MIP images confirms the above findings CTA HEAD FINDINGS Anterior circulation: No flow seen in the right carotid or MCA branches. There is distal M2 M3 branch reconstitution. Calcified plaque along the left carotid siphon. No MCA branch occlusion, aneurysm, or beading. Posterior circulation: The vertebral and basilar arteries are smooth and widely patent. High-grade atheromatous narrowings of the bilateral PCA, P3 segment on the right and upper first order branch on the left. Venous sinuses: Patent as permitted by contrast timing Anatomic variants: None significant Review of the MIP images confirms the above findings CT Brain Perfusion Findings: ASPECTS: 9 CBF (<30%) Volume: 23mL Perfusion (Tmax>6.0s) volume: 123mL Mismatch Volume: 186mL Infarction Location:Right basal ganglia and lesser extensive lateral frontal cortex infarct. Critical Value/emergent results were called by telephone at the time of interpretation on 06/29/2020 at 9:37 am to provider ERIC [facility] , who verbally acknowledged these results.  IMPRESSION: 1. Emergent large vessel occlusion with no flow seen in the right internal carotid or proximal MCA. There is a 36 cc core infarct and 142 cc of penumbra by CT perfusion. 2. Atherosclerosis without flow limiting stenosis of the other major vessels. 3. Bilateral high-grade atheromatous PCA narrowings, more proximal on the right. 4. 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 Electronically Signed   By: Monte Fantasia M.D.   On: 06/29/2020 09:53   MR BRAIN WO CONTRAST  Result Date: 06/30/2020 CLINICAL DATA:  Stroke follow-up EXAM: MRI HEAD WITHOUT CONTRAST TECHNIQUE: Multiplanar, multiecho  pulse sequences of the brain and surrounding structures were obtained without intravenous contrast. COMPARISON:  Head CT and CTA from yesterday FINDINGS: Brain: Acute infarction at the right stratum and patchy along the insula and lateral frontal cortex. The area of infarction appears similar to the area of pre treatment core infarct. Hematoma centered at the right putamen with dimensions better assessed by prior CT, grossly similar at to 3.5 cm anterior to posterior. Correlating to fluid fluid level, there is a posterior dense clot appearance and more heterogeneous anterior component. On gradient imaging petechial hemorrhage is seen along the right MCA watershed. No hydrocephalus or shift. Remote high right frontal cortex infarct anteriorly. Vascular: No flow voids seen in the left ICA beginning in the upper cervical spine. There is normalization by the supraclinoid segment. Skull and upper cervical spine: No focal marrow lesion Sinuses/Orbits: Right middle meatus obstruction with frontal, ethmoid, and especially maxillary sinusitis on the right. IMPRESSION: 1. Acute infarct at the right basal ganglia with nonprogressive hemorrhage when correlated with prior CT. Less extensive patchy acute cortical infarct in the right MCA territory. The areas of acute infarction correlate well with  pretreatment CTP. 2. 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 Electronically Signed   By: Monte Fantasia M.D.   On: 06/30/2020 04:22   CT Code Stroke Cerebral Perfusion with contrast  Result Date: 06/29/2020 CLINICAL DATA:  Hyperdense vessel and stroke-like symptoms EXAM: CT ANGIOGRAPHY HEAD AND NECK CT PERFUSION BRAIN TECHNIQUE: Multidetector CT imaging of the head and neck was performed using the standard protocol during bolus administration of intravenous contrast. Multiplanar CT image reconstructions and MIPs were obtained to evaluate the vascular anatomy. Carotid stenosis measurements (when applicable) are obtained utilizing NASCET criteria, using the distal internal carotid diameter as the denominator. Multiphase CT imaging of the brain was performed following IV bolus contrast injection. Subsequent parametric perfusion maps were calculated using RAPID software. CONTRAST:  Dose is not yet known COMPARISON:  None. FINDINGS: CTA NECK FINDINGS Aortic arch: Mild atheromatous changes. Three vessel branching. No acute finding. Right carotid system: Atherosclerotic plaque about the bifurcation with occluded ICA bulb. No flow seen within the ICA in the neck. Left carotid system: Atheromatous plaque at the bifurcation and bulb. No stenosis or ulceration. Negative for beading. Vertebral arteries: Low-density plaque at the left subclavian origin. No flow limiting subclavian stenosis. Plaque at the left vertebral origin without stenosis, beading, or dissection. Skeleton: Focal advanced C5-6 disc degeneration with ridging causing biforaminal impingement. Ordinary facet osteoarthritis. Other neck: No acute or aggressive finding. Right-sided chronic sinusitis with pattern middle meatus obstruction. Upper chest: Granulomatous nodal calcifications. There is also a calcified granuloma appearance in the left upper lobe. Review of the MIP images confirms the above findings CTA  HEAD FINDINGS Anterior circulation: No flow seen in the right carotid or MCA branches. There is distal M2 M3 branch reconstitution. Calcified plaque along the left carotid siphon. No MCA branch occlusion, aneurysm, or beading. Posterior circulation: The vertebral and basilar arteries are smooth and widely patent. High-grade atheromatous narrowings of the bilateral PCA, P3 segment on the right and upper first order branch on the left. Venous sinuses: Patent as permitted by contrast timing Anatomic variants: None significant Review of the MIP images confirms the above findings CT Brain Perfusion Findings: ASPECTS: 9 CBF (<30%) Volume: 75mL Perfusion (Tmax>6.0s) volume: 167mL Mismatch Volume: 181mL Infarction Location:Right basal ganglia and lesser extensive lateral frontal cortex infarct. Critical Value/emergent results were called by  telephone at the time of interpretation on 06/29/2020 at 9:37 am to provider ERIC [facility] , who verbally acknowledged these results. IMPRESSION: 1. Emergent large vessel occlusion with no flow seen in the right internal carotid or proximal MCA. There is a 36 cc core infarct and 142 cc of penumbra by CT perfusion. 2. Atherosclerosis without flow limiting stenosis of the other major vessels. 3. Bilateral high-grade atheromatous PCA narrowings, more proximal on the right. 4. 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 Electronically Signed   By: Monte Fantasia M.D.   On: 06/29/2020 09:53   Portable Chest xray  Result Date: 06/30/2020 CLINICAL DATA:  Respiratory failure.  Stroke. EXAM: PORTABLE CHEST 1 VIEW COMPARISON:  06/29/2020 FINDINGS: 0538 hours. Endotracheal tube tip is 7.4 cm above the base of the carina. The lungs are clear without focal pneumonia, edema, pneumothorax or pleural effusion. The cardiopericardial silhouette is within normal limits for size. The visualized bony structures of the thorax show no acute abnormality. Telemetry leads overlie  the chest. IMPRESSION: No acute cardiopulmonary findings. Electronically Signed   By: Misty Stanley M.D.   On: 06/30/2020 07:30   DG CHEST PORT 1 VIEW  Result Date: 06/29/2020 CLINICAL DATA:  Code stroke with coiling intubated EXAM: PORTABLE CHEST 1 VIEW COMPARISON:  None. FINDINGS: Endotracheal tube tip is about 4.2 cm superior to the carina. Esophageal tube tip is below the diaphragm but incompletely visualized. No focal opacity or pleural effusion. Normal cardiac size. No pneumothorax. IMPRESSION: Endotracheal tube tip about 4.2 cm superior to the carina. Lungs grossly clear Electronically Signed   By: Donavan Foil M.D.   On: 06/29/2020 16:00   ECHOCARDIOGRAM COMPLETE  Result Date: 06/30/2020    ECHOCARDIOGRAM REPORT   Patient Name:   Tyler Richards Date of Exam: 06/30/2020 Medical Rec #:  [id number]     Height:       74.0 in Accession #:    [id number]    Weight:       187.6 lb Date of Birth:  1951/02/04     BSA:          2.115 m Patient Age:    23 years      BP:           162/65 mmHg Patient Gender: M             HR:           52 bpm. Exam Location:  Inpatient Procedure: 2D Echo, Cardiac Doppler, Color Doppler and Intracardiac            Opacification Agent Indications:    CVA  History:        Patient has no prior history of Echocardiogram examinations.                 Stroke and COPD, Signs/Symptoms:Resp. failure; Risk                 Factors:Hypertension.  Sonographer:    Dustin Flock Referring Phys: [phone] ERIC LINDZEN  Sonographer Comments: Technically difficult study due to poor echo windows and echo performed with patient supine and on artificial respirator. Image acquisition challenging due to COPD and Image acquisition challenging due to respiratory motion. IMPRESSIONS  1. Left ventricular ejection fraction, by estimation, is 50 to 55%. The left ventricle has low normal function. The left ventricle has no regional wall motion abnormalities. Left ventricular diastolic parameters are consistent  with Grade I diastolic dysfunction (impaired relaxation).  2. Right ventricular  systolic function is normal. The right ventricular size is normal. There is mildly elevated pulmonary artery systolic pressure.  3. Left atrial size was mildly dilated.  4. The mitral valve is normal in structure. No evidence of mitral valve regurgitation. No evidence of mitral stenosis.  5. The aortic valve is normal in structure. Aortic valve regurgitation is not visualized. No aortic stenosis is present.  6. The inferior vena cava is dilated in size with <50% respiratory variability, suggesting right atrial pressure of 15 mmHg. FINDINGS  Left Ventricle: Left ventricular ejection fraction, by estimation, is 50 to 55%. The left ventricle has low normal function. The left ventricle has no regional wall motion abnormalities. Definity contrast agent was given IV to delineate the left ventricular endocardial borders. The left ventricular internal cavity size was normal in size. There is no left ventricular hypertrophy. Left ventricular diastolic parameters are consistent with Grade I diastolic dysfunction (impaired relaxation). Indeterminate filling pressures. Right Ventricle: The right ventricular size is normal. No increase in right ventricular wall thickness. Right ventricular systolic function is normal. There is mildly elevated pulmonary artery systolic pressure. The tricuspid regurgitant velocity is 2.43  m/s, and with an assumed right atrial pressure of 15 mmHg, the estimated right ventricular systolic pressure is 50.2 mmHg. Left Atrium: Left atrial size was mildly dilated. Right Atrium: Right atrial size was normal in size. Pericardium: There is no evidence of pericardial effusion. Mitral Valve: The mitral valve is normal in structure. No evidence of mitral valve regurgitation. No evidence of mitral valve stenosis. Tricuspid Valve: The tricuspid valve is normal in structure. Tricuspid valve regurgitation is trivial. No evidence of  tricuspid stenosis. Aortic Valve: The aortic valve is normal in structure. Aortic valve regurgitation is not visualized. No aortic stenosis is present. Pulmonic Valve: The pulmonic valve was normal in structure. Pulmonic valve regurgitation is not visualized. No evidence of pulmonic stenosis. Aorta: The aortic root is normal in size and structure. Venous: The inferior vena cava is dilated in size with less than 50% respiratory variability, suggesting right atrial pressure of 15 mmHg. IAS/Shunts: No atrial level shunt detected by color flow Doppler.  LEFT VENTRICLE PLAX 2D LVIDd:         5.30 cm  Diastology LVIDs:         3.80 cm  LV e' medial:    6.85 cm/s LV PW:         1.30 cm  LV E/e' medial:  10.2 LV IVS:        1.00 cm  LV e' lateral:   12.10 cm/s LVOT diam:     2.10 cm  LV E/e' lateral: 5.8 LV SV:         106 LV SV Index:   50 LVOT Area:     3.46 cm  RIGHT VENTRICLE RV Basal diam:  3.80 cm RV S prime:     7.07 cm/s TAPSE (M-mode): 3.3 cm LEFT ATRIUM           Index       RIGHT ATRIUM           Index LA diam:      4.30 cm 2.03 cm/m  RA Area:     21.00 cm LA Vol (A4C): 45.5 ml 21.51 ml/m RA Volume:   67.30 ml  31.82 ml/m  AORTIC VALVE LVOT Vmax:   150.00 cm/s LVOT Vmean:  90.400 cm/s LVOT VTI:    0.305 m  AORTA Ao Root diam: 3.50 cm MITRAL VALVE  TRICUSPID VALVE MV Area (PHT): 2.80 cm    TR Peak grad:   23.6 mmHg MV Decel Time: 271 msec    TR Vmax:        243.00 cm/s MV E velocity: 69.80 cm/s MV A velocity: 68.60 cm/s  SHUNTS MV E/A ratio:  1.02        Systemic VTI:  0.30 m                            Systemic Diam: 2.10 cm Skeet Latch MD Electronically signed by Skeet Latch MD Signature Date/Time: 06/30/2020/11:25:18 AM    Final    CT HEAD CODE STROKE WO CONTRAST  Result Date: 06/29/2020 CLINICAL DATA:  Code stroke.  Slurred speech and left facial droop EXAM: CT HEAD WITHOUT CONTRAST TECHNIQUE: Contiguous axial images were obtained from the base of the skull through the vertex  without intravenous contrast. COMPARISON:  None. FINDINGS: Brain: Small remote appearing cortically based infarcts in the superior right frontal lobe. No hemorrhage, hydrocephalus, or masslike finding. Vascular: Hyperdense distal right ICA to MCA branches. Atherosclerotic calcification. Skull: Negative Sinuses/Orbits: Right maxillary, ethmoid, and frontal sinus opacification with sclerotic wall thickening at the maxillary sinus. Other: Critical Value/emergent results were called by telephone at the time of interpretation on 06/29/2020 at 9:23 am to provider Lindzen , who verbally acknowledged these results. ASPECTS [facility] Stroke Program Early CT Score) - Ganglionic level infarction (caudate, lentiform nuclei, internal capsule, insula, M1-M3 cortex): Posterior putamen appears blunted compared to the left. Equivocal for small insular cortex infarct. - Supraganglionic infarction (M4-M6 cortex): 3, when accounting for chronic infarct Total score (0-10 with 10 being normal): 9, when accounting for chronic infarct IMPRESSION: 1. Hyperdense distal right ICA and proximal MCA. 2. Blunted appearance of the posterior right putamen. Chronic right high frontal cortex infarcts. ASPECTS is 9 when excluding the chronic changes. Electronically Signed   By: Monte Fantasia M.D.   On: 06/29/2020 09:25   VAS US CAROTID  Result Date: 06/30/2020 Carotid Arterial Duplex Study Indications:   CVA and Right stent. Other Factors: Rt ica stent- 06/29/20. Performing Technologist: [name] RVS  Examination Guidelines: A complete evaluation includes B-mode imaging, spectral Doppler, color Doppler, and power Doppler as needed of all accessible portions of each vessel. Bilateral testing is considered an integral part of a complete examination. Limited examinations for reoccurring indications may be performed as noted.  Right Carotid Findings: +----------+--------+--------+--------+------------------+--------+           PSV cm/sEDV  cm/sStenosisPlaque DescriptionComments +----------+--------+--------+--------+------------------+--------+ CCA Prox  101                     heterogenous               +----------+--------+--------+--------+------------------+--------+ CCA Distal57                      heterogenous               +----------+--------+--------+--------+------------------+--------+ ECA       103     6                                          +----------+--------+--------+--------+------------------+--------+ +----------+--------+-------+--------+-------------------+           PSV cm/sEDV cmsDescribeArm Pressure (mmHG) +----------+--------+-------+--------+-------------------+ Subclavian160                                        +----------+--------+-------+--------+-------------------+ +---------+--------+--+--------+--+---------+  VertebralPSV cm/s63EDV cm/s12Antegrade +---------+--------+--+--------+--+---------+  Right Stent(s): +---------------+--+-+--------+++ Prox to Stent  274         +---------------+--+-+--------+++ Proximal Stent 49          +---------------+--+-+--------+++ Mid Stent         occluded +---------------+--+-+--------+++ Distal Stent      occluded +---------------+--+-+--------+++ Distal to Stent   occluded +---------------+--+-+--------+++   Left Carotid Findings: +----------+--------+--------+--------+------------------+--------+           PSV cm/sEDV cm/sStenosisPlaque DescriptionComments +----------+--------+--------+--------+------------------+--------+ CCA Prox  133     18              heterogenous               +----------+--------+--------+--------+------------------+--------+ CCA Distal95      17              heterogenous               +----------+--------+--------+--------+------------------+--------+ ICA Prox  97      22      1-39%   heterogenous                +----------+--------+--------+--------+------------------+--------+ ICA Distal108     36                                         +----------+--------+--------+--------+------------------+--------+ ECA       120     10                                         +----------+--------+--------+--------+------------------+--------+ +----------+--------+--------+--------+-------------------+           PSV cm/sEDV cm/sDescribeArm Pressure (mmHG) +----------+--------+--------+--------+-------------------+ [id number]                                         +----------+--------+--------+--------+-------------------+ +---------+--------+--+--------+--+---------+ VertebralPSV cm/s48EDV cm/s13Antegrade +---------+--------+--+--------+--+---------+   Summary: Right Carotid: ICA stent appears occluded. Left Carotid: Velocities in the left ICA are consistent with a 1-39% stenosis. Vertebrals: Bilateral vertebral arteries demonstrate antegrade flow. *See table(s) above for measurements and observations.     Preliminary    VAS [place] LOWER EXTREMITY VENOUS (DVT)  Result Date: 06/30/2020  Lower Venous DVTStudy Indications: Stroke.  Comparison Study: no prior Performing Technologist: [name] RVS  Examination Guidelines: A complete evaluation includes B-mode imaging, spectral Doppler, color Doppler, and power Doppler as needed of all accessible portions of each vessel. Bilateral testing is considered an integral part of a complete examination. Limited examinations for reoccurring indications may be performed as noted. The reflux portion of the exam is performed with the patient in reverse Trendelenburg.  +---------+---------------+---------+-----------+----------+--------------+ RIGHT    CompressibilityPhasicitySpontaneityPropertiesThrombus Aging +---------+---------------+---------+-----------+----------+--------------+ CFV      Full           Yes      Yes                                  +---------+---------------+---------+-----------+----------+--------------+ SFJ      Full                                                        +---------+---------------+---------+-----------+----------+--------------+  FV Prox  Full                                                        +---------+---------------+---------+-----------+----------+--------------+ FV Mid   Full                                                        +---------+---------------+---------+-----------+----------+--------------+ FV DistalFull                                                        +---------+---------------+---------+-----------+----------+--------------+ PFV      Full                                                        +---------+---------------+---------+-----------+----------+--------------+ POP      Full           Yes      Yes                                 +---------+---------------+---------+-----------+----------+--------------+ PTV      Full                                                        +---------+---------------+---------+-----------+----------+--------------+ PERO     Full                                                        +---------+---------------+---------+-----------+----------+--------------+   +---------+---------------+---------+-----------+----------+--------------+ LEFT     CompressibilityPhasicitySpontaneityPropertiesThrombus Aging +---------+---------------+---------+-----------+----------+--------------+ CFV      Full           Yes      Yes                                 +---------+---------------+---------+-----------+----------+--------------+ SFJ      Full                                                        +---------+---------------+---------+-----------+----------+--------------+ FV Prox  Full                                                         +---------+---------------+---------+-----------+----------+--------------+  FV Mid   Full                                                        +---------+---------------+---------+-----------+----------+--------------+ FV DistalFull                                                        +---------+---------------+---------+-----------+----------+--------------+ PFV      Full                                                        +---------+---------------+---------+-----------+----------+--------------+ POP      Full           Yes      Yes                                 +---------+---------------+---------+-----------+----------+--------------+ PTV      Full                                                        +---------+---------------+---------+-----------+----------+--------------+     Summary: BILATERAL: - No evidence of deep vein thrombosis seen in the lower extremities, bilaterally. - No evidence of superficial venous thrombosis in the lower extremities, bilaterally. -   *See table(s) above for measurements and observations.    Preliminary      Assessment/Plan: Diagnosis: R MCA infarct 1. Does the need for close, 24 hr/day medical supervision in concert with the patient's rehab needs make it unreasonable for this patient to be served in a less intensive setting? Yes 2. Co-Morbidities requiring supervision/potential complications: carotid occlusion, left sided severe weakness, hypotension, bradycardia, secretions, cough, HLD, dysphagia 3. Due to bladder management, bowel management, safety, skin/wound care, disease management, medication administration, pain management and patient education, does the patient require 24 hr/day rehab nursing? Yes 4. Does the patient require coordinated care of a physician, rehab nurse, therapy disciplines of PT, OT, SLP to address physical and functional deficits in the context of the above medical diagnosis(es)? Yes Addressing  deficits in the following areas: balance, endurance, locomotion, strength, transferring, bowel/bladder control, bathing, dressing, feeding, grooming, toileting, speech, swallowing and psychosocial support 5. Can the patient actively participate in an intensive therapy program of at least 3 hrs of therapy per day at least 5 days per week? Yes 6. The potential for patient to make measurable gains while on inpatient rehab is excellent 7. Anticipated functional outcomes upon discharge from inpatient rehab are min assist  with PT, min assist with OT, min assist with SLP. 8. Estimated rehab length of stay to reach the above functional goals is: 2-3 weeks 9. Anticipated discharge destination: Home 10. Overall Rehab/Functional Prognosis: excellent  RECOMMENDATIONS: This patient's condition is appropriate for continued rehabilitative care in the following setting: CIR Patient has agreed  to participate in recommended program. Yes Note that insurance prior authorization may be required for reimbursement for recommended care.  Comment: Thank you for this consult. Admission coordinator to follow.   I have personally performed a face to face diagnostic evaluation, including, but not limited to relevant history and physical exam findings, of this patient and developed relevant assessment and plan.  Additionally, I have reviewed and concur with the physician assistant's documentation above.  Leeroy Cha, MD  Lavon Paganini [place], PA-C 06/30/2020

## 2020-06-30 NOTE — Progress Notes (Signed)
NAME:  Tyler Richards, MRN:  [id number], DOB:  1951-04-18, LOS: 1 ADMISSION DATE:  06/29/2020, CONSULTATION DATE:  06/29/20 REFERRING MD:  Estanislado Pandy  CHIEF COMPLAINT:  AMS   Brief History   Tyler Richards is a 69 y.o. male who was admitted 10/4 with R MCA CVA s/p revascularization and R ICA stent assisted angioplasty and revascularization. Loaded with aspirin and Brilinta  Past Medical History  has Arterial ischemic stroke, MCA, right, acute ([place]); Stroke (cerebrum) ([place]); Acute encephalopathy; Hypertension; Acute respiratory failure with hypoxia ([facility]); and Middle cerebral artery embolism, right on their problem list.  Significant Hospital Events   10/4 > admit.  Consults:  PCCM.  Procedures:  ETT 10/4 > 10/5  Significant Diagnostic Tests:  CT / CTA head 10/4 > LVO R ICA / prox MCA. MRi brain 10/5 Acute infarct at the right basal ganglia with nonprogressive hemorrhage when correlated with prior CT. Less extensive patchy acute cortical infarct in the right MCA territory. The areas of acute infarction correlate well with pretreatment CTP. 2. 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 CT head 10/5 3.9 x 3.0 x 3.0 cm hyperdense region in the right posterior basal ganglia most consistent with parenchymal hematoma, given small internal fluid levels. Mild mass effect and surrounding edema. No idline shift. Chronic ischemic changes in the right posterior frontal region as seen previously. Echo 10/4 >  Left ventricular ejection fraction, by estimation, is 50 to 44%, grade 1 diastolic dysfunction  Micro Data:  COVID 10/4 > neg. Flu 10/4 > neg.  Antimicrobials:  None.   Interim history/subjective:  Patient tolerated pressure support, successfully extubated  Objective:  Blood pressure (!) 151/62, pulse (!) 53, temperature 98.7 F (37.1 C), temperature source Axillary, resp. rate 19, height 6\' 2"  (1.88 m), weight 85.1 kg, SpO2 100 %.    Vent Mode:  PSV;CPAP FiO2 (%):  [40 %-100 %] 40 % Set Rate:  [15 bmp-17 bmp] 17 bmp Vt Set:  [570 mL-650 mL] 570 mL PEEP:  [5 cmH20] 5 cmH20 Pressure Support:  [5 cmH20-8 cmH20] 5 cmH20 Plateau Pressure:  [13 cmH20-19 cmH20] 13 cmH20   Intake/Output Summary (Last 24 hours) at 06/30/2020 1131 Last data filed at 06/30/2020 0800 Gross per 24 hour  Intake 3068.37 ml  Output 2300 ml  Net 768.37 ml   Filed Weights   06/29/20 1730  Weight: 85.1 kg    Examination: General: Elderly Caucasian male, lying in the bed Neuro: Awake, following commands.  Right side 5/5, left lower extremity 2/5, left upper extremity 0/5 HEENT: [place]/AT. Sclerae anicteric.  Moist mucous membranes Cardiovascular: RRR, no M/R/G.  Lungs: Clear to auscultation bilaterally, no rhonchi or wheezes Abdomen: BS x 4, soft, NT/ND.  Musculoskeletal: No gross deformities, no edema.  Skin: Intact, warm, no rashes.  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 Pain/Anxiety/Delirium protocol (if indicated): N/A VAP protocol (if indicated): N/A DVT prophylaxis: SCD's. GI prophylaxis: PPI. Glucose control: SSI if glucose consistently > 180. Mobility: Bedrest. Code Status: Full. Family Communication: Per primary. Disposition: ICU.  Labs   CBC: Recent  Labs  Lab 06/29/20 0904 06/29/20 0915 06/29/20 1738 06/30/20 0043  WBC 9.0  --   --  9.7  NEUTROABS 6.8  --   --  7.2  HGB 13.9 14.3 11.9* 12.2*  HCT 41.2 42.0 35.0* 36.2*  MCV 96.5  --   --  97.1  PLT 320  --   --  323   Basic Metabolic  Panel: Recent Labs  Lab 06/29/20 0904 06/29/20 0915 06/29/20 1738 06/30/20 0043  NA 136 138 138 138  K 5.0 4.5 3.8 3.9  CL 103 105  --  106  CO2 20*  --   --  20*  GLUCOSE 104* 105*  --  113*  BUN 14 17  --  10  CREATININE 0.87 0.70  --  0.89  CALCIUM 9.1  --   --  8.4*   GFR: Estimated Creatinine Clearance: 89.8 mL/min (by C-G formula based on SCr of 0.89 mg/dL). Recent Labs  Lab 06/29/20 0904 06/30/20 0043  WBC 9.0 9.7   Liver Function Tests: Recent Labs  Lab 06/29/20 0904  AST 36  ALT 17  ALKPHOS 61  BILITOT 1.8*  PROT 6.5  ALBUMIN 3.3*   No results for input(s): LIPASE, AMYLASE in the last 168 hours. No results for input(s): AMMONIA in the last 168 hours. ABG    Component Value Date/Time   PHART 7.380 06/29/2020 1738   PCO2ART 41.3 06/29/2020 1738   PO2ART 252 (H) 06/29/2020 1738   HCO3 24.4 06/29/2020 1738   TCO2 26 06/29/2020 1738   ACIDBASEDEF 1.0 06/29/2020 1738   O2SAT 100.0 06/29/2020 1738    Coagulation Profile: Recent Labs  Lab 06/29/20 0904  INR 1.0   Cardiac Enzymes: No results for input(s): CKTOTAL, CKMB, CKMBINDEX, TROPONINI in the last 168 hours. HbA1C: Hgb A1c MFr Bld  Date/Time Value Ref Range Status  06/30/2020 12:43 AM 5.6 4.8 - 5.6 % Final    Comment:    (NOTE) Pre diabetes:          5.7%-6.4%  Diabetes:              >6.4%  Glycemic control for   <7.0% adults with diabetes    CBG: Recent Labs  Lab 06/29/20 0904  GLUCAP 88    Review of Systems:   Unable to obtain as pt is encephalopathic.  Past medical history  He,  has no past medical history on file.   Surgical History   unknown  Social History   reports that he has never smoked. He has never used smokeless tobacco. He reports previous alcohol use. He reports that he does not use drugs.   Family history   His family history is not on file.   Allergies No Known Allergies   Home meds  Prior to Admission medications   Medication Sig Start Date End  Date Taking? Authorizing Provider  acetaminophen (TYLENOL) 325 MG tablet Take 650 mg by mouth every 6 (six) hours as needed for mild pain or headache.   Yes [provider]  aspirin EC 81 MG tablet Take 81 mg by mouth daily as needed for mild pain. Swallow whole.   Yes [provider]    Total critical care time: 30 minutes  Performed by: [place] care time was exclusive of separately billable procedures and treating other patients.   Critical care was necessary to treat or prevent imminent or life-threatening deterioration.   Critical care was time spent personally by me on  the following activities: development of treatment plan with patient and/or surrogate as well as nursing, discussions with consultants, evaluation of patient's response to treatment, examination of patient, obtaining history from patient or surrogate, ordering and performing treatments and interventions, ordering and review of laboratory studies, ordering and review of radiographic studies, pulse oximetry and re-evaluation of patient's condition.   Jacky Kindle MD Critical care physician [facility]  Pager: [phone] Mobile: [phone]

## 2020-06-30 NOTE — Progress Notes (Signed)
Referring Physician(s): Code stroke 06/29/20  Supervising Physician: Luanne Bras  Patient Status:  [facility] - In-pt  Chief Complaint: Follow up code stroke s/p right MCA thrombectomy and right ICA stent placement 10/4 in NIR  Subjective:  Patient remains intubated, weaning propofol. He responds to verbal cues and is able to follow commands. Per RN dysconjugate gaze noted earlier when sedation was higher but appears to have resolved now. No visitors at bedside.  Allergies: Patient has no known allergies.  Medications: Prior to Admission medications   Medication Sig Start Date End Date Taking? Authorizing Provider  acetaminophen (TYLENOL) 325 MG tablet Take 650 mg by mouth every 6 (six) hours as needed for mild pain or headache.   Yes [provider]  aspirin EC 81 MG tablet Take 81 mg by mouth daily as needed for mild pain. Swallow whole.   Yes [provider]     Vital Signs: BP (!) 152/57   Pulse (!) 52   Temp 98.7 F (37.1 C) (Axillary)   Resp 17   Ht 6\' 2"  (1.88 m)   Wt 187 lb 9.8 oz (85.1 kg)   SpO2 100%   BMI 24.09 kg/m   Physical Exam Vitals and nursing note reviewed.  Constitutional:      Comments: Intubated, somnolent due to sedation but arouses easily to verbal and tactile cues.  Cardiovascular:     Rate and Rhythm: Bradycardia present.     Comments: (+) right CFA site clean, dry, dressed appropriately. No active bleeding or drainage. Soft, non-pulsatile. Pulmonary:     Comments: Intubated Skin:    General: Skin is warm and dry.   Arouses to verbal/tactile cues but is somnolent from sedation. Speech - unable to assess (intubated), comprehension - follows commands appropriately. PERRL bilaterally EOMs without obvious nystagmus or subjective diplopia. Visual fields - not assessed. No obvious facial asymmetry - difficult to assess due to presence of ETT. Tongue midline - unable to assess. Motor power - full RUE/RLE extremities, no  movement LUE and very minimal movement LLE (barely moves left great toe when asked to wiggle toes). Pronator drift - not assessed. Fine motor and coordination - not assessed Gait - not assessed. Romberg - not assessed. Heel to toe - not assessed. Distal pulses - palpable.   Imaging: CT Code Stroke CTA Head W/WO contrast  Result Date: 06/29/2020 CLINICAL DATA:  Hyperdense vessel and stroke-like symptoms EXAM: CT ANGIOGRAPHY HEAD AND NECK CT PERFUSION BRAIN TECHNIQUE: Multidetector CT imaging of the head and neck was performed using the standard protocol during bolus administration of intravenous contrast. Multiplanar CT image reconstructions and MIPs were obtained to evaluate the vascular anatomy. Carotid stenosis measurements (when applicable) are obtained utilizing NASCET criteria, using the distal internal carotid diameter as the denominator. Multiphase CT imaging of the brain was performed following IV bolus contrast injection. Subsequent parametric perfusion maps were calculated using RAPID software. CONTRAST:  Dose is not yet known COMPARISON:  None. FINDINGS: CTA NECK FINDINGS Aortic arch: Mild atheromatous changes. Three vessel branching. No acute finding. Right carotid system: Atherosclerotic plaque about the bifurcation with occluded ICA bulb. No flow seen within the ICA in the neck. Left carotid system: Atheromatous plaque at the bifurcation and bulb. No stenosis or ulceration. Negative for beading. Vertebral arteries: Low-density plaque at the left subclavian origin. No flow limiting subclavian stenosis. Plaque at the left vertebral origin without stenosis, beading, or dissection. Skeleton: Focal advanced C5-6 disc degeneration with ridging causing biforaminal impingement. Ordinary facet  osteoarthritis. Other neck: No acute or aggressive finding. Right-sided chronic sinusitis with pattern middle meatus obstruction. Upper chest: Granulomatous nodal calcifications. There is also a calcified  granuloma appearance in the left upper lobe. Review of the MIP images confirms the above findings CTA HEAD FINDINGS Anterior circulation: No flow seen in the right carotid or MCA branches. There is distal M2 M3 branch reconstitution. Calcified plaque along the left carotid siphon. No MCA branch occlusion, aneurysm, or beading. Posterior circulation: The vertebral and basilar arteries are smooth and widely patent. High-grade atheromatous narrowings of the bilateral PCA, P3 segment on the right and upper first order branch on the left. Venous sinuses: Patent as permitted by contrast timing Anatomic variants: None significant Review of the MIP images confirms the above findings CT Brain Perfusion Findings: ASPECTS: 9 CBF (<30%) Volume: 52mL Perfusion (Tmax>6.0s) volume: 153mL Mismatch Volume: 122mL Infarction Location:Right basal ganglia and lesser extensive lateral frontal cortex infarct. Critical Value/emergent results were called by telephone at the time of interpretation on 06/29/2020 at 9:37 am to provider ERIC [facility] , who verbally acknowledged these results. IMPRESSION: 1. Emergent large vessel occlusion with no flow seen in the right internal carotid or proximal MCA. There is a 36 cc core infarct and 142 cc of penumbra by CT perfusion. 2. Atherosclerosis without flow limiting stenosis of the other major vessels. 3. Bilateral high-grade atheromatous PCA narrowings, more proximal on the right. 4. 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 Electronically Signed   By: Monte Fantasia M.D.   On: 06/29/2020 09:53   CT HEAD WO CONTRAST  Result Date: 06/29/2020 CLINICAL DATA:  Follow-up stroke and subsequent intervention. EXAM: CT HEAD WITHOUT CONTRAST TECHNIQUE: Contiguous axial images were obtained from the base of the skull through the vertex without intravenous contrast. COMPARISON:  Earlier same day FINDINGS: Brain: 3.9 x 3.0 x 3.0 cm hyperdense region in the right posterior basal  ganglia most consistent with a parenchymal hematoma, given small internal fluid levels. Mild mass effect and surrounding edema. No midline shift. Chronic ischemic changes in the right posterior frontal region as seen previously. Vascular: No other new vascular finding. Skull: Negative Sinuses/Orbits: Opacified right maxillary and ethmoid sinuses. Orbits negative. Other: None IMPRESSION: 1. 3.9 x 3.0 x 3.0 cm hyperdense region in the right posterior basal ganglia most consistent with parenchymal hematoma, given small internal fluid levels. Mild mass effect and surrounding edema. No midline shift. 2. Chronic ischemic changes in the right posterior frontal region as seen previously. 3. Opacified right maxillary and ethmoid sinuses. Electronically Signed   By: Nelson Chimes M.D.   On: 06/29/2020 18:48   CT Code Stroke CTA Neck W/WO contrast  Result Date: 06/29/2020 CLINICAL DATA:  Hyperdense vessel and stroke-like symptoms EXAM: CT ANGIOGRAPHY HEAD AND NECK CT PERFUSION BRAIN TECHNIQUE: Multidetector CT imaging of the head and neck was performed using the standard protocol during bolus administration of intravenous contrast. Multiplanar CT image reconstructions and MIPs were obtained to evaluate the vascular anatomy. Carotid stenosis measurements (when applicable) are obtained utilizing NASCET criteria, using the distal internal carotid diameter as the denominator. Multiphase CT imaging of the brain was performed following IV bolus contrast injection. Subsequent parametric perfusion maps were calculated using RAPID software. CONTRAST:  Dose is not yet known COMPARISON:  None. FINDINGS: CTA NECK FINDINGS Aortic arch: Mild atheromatous changes. Three vessel branching. No acute finding. Right carotid system: Atherosclerotic plaque about the bifurcation with occluded ICA bulb. No flow seen within the ICA in the neck. Left  carotid system: Atheromatous plaque at the bifurcation and bulb. No stenosis or ulceration. Negative  for beading. Vertebral arteries: Low-density plaque at the left subclavian origin. No flow limiting subclavian stenosis. Plaque at the left vertebral origin without stenosis, beading, or dissection. Skeleton: Focal advanced C5-6 disc degeneration with ridging causing biforaminal impingement. Ordinary facet osteoarthritis. Other neck: No acute or aggressive finding. Right-sided chronic sinusitis with pattern middle meatus obstruction. Upper chest: Granulomatous nodal calcifications. There is also a calcified granuloma appearance in the left upper lobe. Review of the MIP images confirms the above findings CTA HEAD FINDINGS Anterior circulation: No flow seen in the right carotid or MCA branches. There is distal M2 M3 branch reconstitution. Calcified plaque along the left carotid siphon. No MCA branch occlusion, aneurysm, or beading. Posterior circulation: The vertebral and basilar arteries are smooth and widely patent. High-grade atheromatous narrowings of the bilateral PCA, P3 segment on the right and upper first order branch on the left. Venous sinuses: Patent as permitted by contrast timing Anatomic variants: None significant Review of the MIP images confirms the above findings CT Brain Perfusion Findings: ASPECTS: 9 CBF (<30%) Volume: 90mL Perfusion (Tmax>6.0s) volume: 170mL Mismatch Volume: 150mL Infarction Location:Right basal ganglia and lesser extensive lateral frontal cortex infarct. Critical Value/emergent results were called by telephone at the time of interpretation on 06/29/2020 at 9:37 am to provider ERIC [facility] , who verbally acknowledged these results. IMPRESSION: 1. Emergent large vessel occlusion with no flow seen in the right internal carotid or proximal MCA. There is a 36 cc core infarct and 142 cc of penumbra by CT perfusion. 2. Atherosclerosis without flow limiting stenosis of the other major vessels. 3. Bilateral high-grade atheromatous PCA narrowings, more proximal on the right. 4. Incidental  chronic right-sided sinusitis from middle meatus obstruction. Electronically Signed   By: Monte Fantasia M.D.   On: 06/29/2020 09:53   MR BRAIN WO CONTRAST  Result Date: 06/30/2020 CLINICAL DATA:  Stroke follow-up EXAM: MRI HEAD WITHOUT CONTRAST TECHNIQUE: Multiplanar, multiecho pulse sequences of the brain and surrounding structures were obtained without intravenous contrast. COMPARISON:  Head CT and CTA from yesterday FINDINGS: Brain: Acute infarction at the right stratum and patchy along the insula and lateral frontal cortex. The area of infarction appears similar to the area of pre treatment core infarct. Hematoma centered at the right putamen with dimensions better assessed by prior CT, grossly similar at to 3.5 cm anterior to posterior. Correlating to fluid fluid level, there is a posterior dense clot appearance and more heterogeneous anterior component. On gradient imaging petechial hemorrhage is seen along the right MCA watershed. No hydrocephalus or shift. Remote high right frontal cortex infarct anteriorly. Vascular: No flow voids seen in the left ICA beginning in the upper cervical spine. There is normalization by the supraclinoid segment. Skull and upper cervical spine: No focal marrow lesion Sinuses/Orbits: Right middle meatus obstruction with frontal, ethmoid, and especially maxillary sinusitis on the right. IMPRESSION: 1. Acute infarct at the right basal ganglia with nonprogressive hemorrhage when correlated with prior CT. Less extensive patchy acute cortical infarct in the right MCA territory. The areas of acute infarction correlate well with pretreatment CTP. 2. 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 Electronically Signed   By: Monte Fantasia M.D.   On: 06/30/2020 04:22   CT Code Stroke Cerebral Perfusion with contrast  Result Date: 06/29/2020 CLINICAL DATA:  Hyperdense vessel and stroke-like symptoms EXAM: CT ANGIOGRAPHY HEAD AND NECK CT  PERFUSION  BRAIN TECHNIQUE: Multidetector CT imaging of the head and neck was performed using the standard protocol during bolus administration of intravenous contrast. Multiplanar CT image reconstructions and MIPs were obtained to evaluate the vascular anatomy. Carotid stenosis measurements (when applicable) are obtained utilizing NASCET criteria, using the distal internal carotid diameter as the denominator. Multiphase CT imaging of the brain was performed following IV bolus contrast injection. Subsequent parametric perfusion maps were calculated using RAPID software. CONTRAST:  Dose is not yet known COMPARISON:  None. FINDINGS: CTA NECK FINDINGS Aortic arch: Mild atheromatous changes. Three vessel branching. No acute finding. Right carotid system: Atherosclerotic plaque about the bifurcation with occluded ICA bulb. No flow seen within the ICA in the neck. Left carotid system: Atheromatous plaque at the bifurcation and bulb. No stenosis or ulceration. Negative for beading. Vertebral arteries: Low-density plaque at the left subclavian origin. No flow limiting subclavian stenosis. Plaque at the left vertebral origin without stenosis, beading, or dissection. Skeleton: Focal advanced C5-6 disc degeneration with ridging causing biforaminal impingement. Ordinary facet osteoarthritis. Other neck: No acute or aggressive finding. Right-sided chronic sinusitis with pattern middle meatus obstruction. Upper chest: Granulomatous nodal calcifications. There is also a calcified granuloma appearance in the left upper lobe. Review of the MIP images confirms the above findings CTA HEAD FINDINGS Anterior circulation: No flow seen in the right carotid or MCA branches. There is distal M2 M3 branch reconstitution. Calcified plaque along the left carotid siphon. No MCA branch occlusion, aneurysm, or beading. Posterior circulation: The vertebral and basilar arteries are smooth and widely patent. High-grade atheromatous narrowings of the  bilateral PCA, P3 segment on the right and upper first order branch on the left. Venous sinuses: Patent as permitted by contrast timing Anatomic variants: None significant Review of the MIP images confirms the above findings CT Brain Perfusion Findings: ASPECTS: 9 CBF (<30%) Volume: 70mL Perfusion (Tmax>6.0s) volume: 136mL Mismatch Volume: 121mL Infarction Location:Right basal ganglia and lesser extensive lateral frontal cortex infarct. Critical Value/emergent results were called by telephone at the time of interpretation on 06/29/2020 at 9:37 am to provider ERIC [facility] , who verbally acknowledged these results. IMPRESSION: 1. Emergent large vessel occlusion with no flow seen in the right internal carotid or proximal MCA. There is a 36 cc core infarct and 142 cc of penumbra by CT perfusion. 2. Atherosclerosis without flow limiting stenosis of the other major vessels. 3. Bilateral high-grade atheromatous PCA narrowings, more proximal on the right. 4. 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 Electronically Signed   By: Monte Fantasia M.D.   On: 06/29/2020 09:53   Portable Chest xray  Result Date: 06/30/2020 CLINICAL DATA:  Respiratory failure.  Stroke. EXAM: PORTABLE CHEST 1 VIEW COMPARISON:  06/29/2020 FINDINGS: 0538 hours. Endotracheal tube tip is 7.4 cm above the base of the carina. The lungs are clear without focal pneumonia, edema, pneumothorax or pleural effusion. The cardiopericardial silhouette is within normal limits for size. The visualized bony structures of the thorax show no acute abnormality. Telemetry leads overlie the chest. IMPRESSION: No acute cardiopulmonary findings. Electronically Signed   By: Misty Stanley M.D.   On: 06/30/2020 07:30   DG CHEST PORT 1 VIEW  Result Date: 06/29/2020 CLINICAL DATA:  Code stroke with coiling intubated EXAM: PORTABLE CHEST 1 VIEW COMPARISON:  None. FINDINGS: Endotracheal tube tip is about 4.2 cm superior to the carina.  Esophageal tube tip is below the diaphragm but incompletely visualized. No focal opacity or pleural effusion. Normal cardiac size. No pneumothorax. IMPRESSION: Endotracheal  tube tip about 4.2 cm superior to the carina. Lungs grossly clear Electronically Signed   By: Donavan Foil M.D.   On: 06/29/2020 16:00   CT HEAD CODE STROKE WO CONTRAST  Result Date: 06/29/2020 CLINICAL DATA:  Code stroke.  Slurred speech and left facial droop EXAM: CT HEAD WITHOUT CONTRAST TECHNIQUE: Contiguous axial images were obtained from the base of the skull through the vertex without intravenous contrast. COMPARISON:  None. FINDINGS: Brain: Small remote appearing cortically based infarcts in the superior right frontal lobe. No hemorrhage, hydrocephalus, or masslike finding. Vascular: Hyperdense distal right ICA to MCA branches. Atherosclerotic calcification. Skull: Negative Sinuses/Orbits: Right maxillary, ethmoid, and frontal sinus opacification with sclerotic wall thickening at the maxillary sinus. Other: Critical Value/emergent results were called by telephone at the time of interpretation on 06/29/2020 at 9:23 am to provider [place] , who verbally acknowledged these results. ASPECTS [facility] Stroke Program Early CT Score) - Ganglionic level infarction (caudate, lentiform nuclei, internal capsule, insula, M1-M3 cortex): Posterior putamen appears blunted compared to the left. Equivocal for small insular cortex infarct. - Supraganglionic infarction (M4-M6 cortex): 3, when accounting for chronic infarct Total score (0-10 with 10 being normal): 9, when accounting for chronic infarct IMPRESSION: 1. Hyperdense distal right ICA and proximal MCA. 2. Blunted appearance of the posterior right putamen. Chronic right high frontal cortex infarcts. ASPECTS is 9 when excluding the chronic changes. Electronically Signed   By: Monte Fantasia M.D.   On: 06/29/2020 09:25    Labs:  CBC: Recent Labs    06/29/20 0904 06/29/20 0915  06/29/20 1738 06/30/20 0043  WBC 9.0  --   --  9.7  HGB 13.9 14.3 11.9* 12.2*  HCT 41.2 42.0 35.0* 36.2*  PLT 320  --   --  380    COAGS: Recent Labs    06/29/20 0904  INR 1.0  APTT 28    BMP: Recent Labs    06/29/20 0904 06/29/20 0915 06/29/20 1738 06/30/20 0043  NA 136 138 138 138  K 5.0 4.5 3.8 3.9  CL 103 105  --  106  CO2 20*  --   --  20*  GLUCOSE 104* 105*  --  113*  BUN 14 17  --  10  CALCIUM 9.1  --   --  8.4*  CREATININE 0.87 0.70  --  0.89  GFRNONAA >60  --   --  >60  GFRAA >60  --   --  >60    LIVER FUNCTION TESTS: Recent Labs    06/29/20 0904  BILITOT 1.8*  AST 36  ALT 17  ALKPHOS 61  PROT 6.5  ALBUMIN 3.3*    Assessment and Plan:  69 y/o M who presented to [facility] ED on 06/29/20 as a code stroke s/p right MCA thrombectomy and right ICA stent placement with post procedure hemorrhage noted seen today for routine follow up.   Patient remains intubated but weaning sedation and is able to follow commands appropriately. Unable to move LUE and very minimal movement to LLE (barely moves left great toe). Right CFA puncture site soft, non-pulsatile, no active bleeding.  Continue SBP goal of 140-160, ASA 81 mg + Brilinta 90 mg BID (due at 1000 per [facility]), carotid US pending per neurology.  Further plans per neurology - NIR remains available as needed, please call with questions or concerns.  Electronically Signed: Joaquim Nam, PA-C 06/30/2020, 9:19 AM   I spent a total of 15 Minutes at the the patient's bedside AND  on the patient's hospital floor or unit, greater than 50% of which was counseling/coordinating care for code stroke follow up.

## 2020-06-30 NOTE — Evaluation (Addendum)
Occupational Therapy Evaluation Patient Details Name: Tyler Richards MRN: [id number] DOB: 1951/04/09 Today's Date: 06/30/2020    History of Present Illness Patient is a 69 y/o male who presents with left sided weakness, right gaze and slurred speech. NIH:17. Head CT- dense Right MCA infarct. CTA- Right ICA and MCA occlusions. s/p right MCA thrombectomy and right ICA stent placement 06/29/20 with post procedure hemmorhage. No PMH.   Clinical Impression   This 69 y/o male presents with the above. PTA pt living with spouse and performing ADL, iADL and mobility tasks independently. Pt currently presenting with deficits including L side weakness, L inattention/neglect, impaired cognition, sitting/standing balance. Pt requiring two person assist for safe completion of bed mobility and sit<>stand attempts. Pt tolerating sitting EOB approx 5-7 min with fluctuating levels of assist, briefly able to maintain static balance with minguard assist. Pt requiring grossly modA for UB ADL, up to [place] for LB ADL. Pt bradycardic this session with HR noted briefly as low as 39bpm (RN made aware). Pt to benefit from continued acute OT services and feel he is an excellent candidate for CIR level therapies at time of discharge to progress his overall safety and independence with ADL and mobility prior to return home.     Follow Up Recommendations  CIR    Equipment Recommendations  3 in 1 bedside commode;Wheelchair (measurements OT);Wheelchair cushion (measurements OT) (TBD)    Recommendations for Other Services Rehab consult     Precautions / Restrictions Precautions Precautions: Fall;Other (comment) Precaution Comments: watch HR Restrictions Weight Bearing Restrictions: No      Mobility Bed Mobility Overal bed mobility: Needs Assistance Bed Mobility: Rolling;Sidelying to Sit Rolling: Mod assist;+2 for physical assistance Sidelying to sit: Max assist;+2 for physical assistance;HOB elevated        General bed mobility comments: Step by step cues for sequencing; to push through RLE to scoot bottom, assist with LLE and elevate trunk with cues to reach for rail  Transfers Overall transfer level: Needs assistance Equipment used: 2 person hand held assist Transfers: Sit to/from Stand Sit to Stand: Max assist;+2 physical assistance         General transfer comment: Assist of 2 to power to standing with cues for anterior translation. Flexed posture and not able to extend through trunk/hips despite cues.    Balance Overall balance assessment: Needs assistance Sitting-balance support: Feet supported;Single extremity supported Sitting balance-Leahy Scale: Fair Sitting balance - Comments: Able to sit statically with close min guard and UE support, LOB in all directions with dynamic tasks/fatigue.   Standing balance support: During functional activity Standing balance-Leahy Scale: Zero Standing balance comment: max A of 2 to partially stand                           ADL either performed or assessed with clinical judgement   ADL Overall ADL's : Needs assistance/impaired Eating/Feeding: NPO   Grooming: Moderate assistance;Sitting Grooming Details (indicate cue type and reason): assist to use yonker for secretions  Upper Body Bathing: Moderate assistance;Sitting   Lower Body Bathing: Maximal assistance;+2 for safety/equipment;+2 for physical assistance;Sitting/lateral leans;Sit to/from stand   Upper Body Dressing : Moderate assistance;Sitting   Lower Body Dressing: Total assistance;+2 for physical assistance;+2 for safety/equipment;Sitting/lateral leans;Sit to/from stand       Toileting- Water quality scientist and Hygiene: Total assistance;+2 for physical assistance;+2 for safety/equipment;Bed level       Functional mobility during ADLs: Maximal assistance;+2 for physical assistance;+2 for safety/equipment (  sit<>stand )       Vision Baseline Vision/History: Wears  glasses Wears Glasses: Reading only Patient Visual Report: No change from baseline Vision Assessment?: Yes;Vision impaired- to be further tested in functional context Eye Alignment: Within Functional Limits Ocular Range of Motion: Restricted on the left;Impaired-to be further tested in functional context Alignment/Gaze Preference: Within Defined Limits Tracking/Visual Pursuits: Decreased smoothness of horizontal tracking;Impaired - to be further tested in functional context;Left eye does not track laterally;Right eye does not track medially Visual Fields: Impaired-to be further tested in functional context (L inattention/neglect)     Perception Perception Perception Tested?: Yes Perception Deficits: Inattention/neglect Inattention/Neglect: Does not attend to left visual field;Does not attend to left side of body;Impaired- to be further tested in functional context   Praxis      Pertinent Vitals/Pain Pain Assessment: No/denies pain     Hand Dominance Right   Extremity/Trunk Assessment Upper Extremity Assessment Upper Extremity Assessment: LUE deficits/detail LUE Deficits / Details: Increased tone noted, otherwise flaccid LUE Sensation: decreased light touch LUE Coordination: decreased fine motor;decreased gross motor   Lower Extremity Assessment Lower Extremity Assessment: Defer to PT evaluation LLE Deficits / Details: Grossly ~2-/5 hip flexion, 2+/5 knee extension, 2+/5 PF, 0/5 DF. LLE Sensation: decreased light touch;decreased proprioception LLE Coordination: decreased fine motor;decreased gross motor       Communication Communication Communication: Expressive difficulties (low raspy voice, just extubated today)   Cognition Arousal/Alertness: Awake/alert Behavior During Therapy: WFL for tasks assessed/performed Overall Cognitive Status: Impaired/Different from baseline Area of Impairment: Problem solving;Attention;Following commands                   Current  Attention Level: Sustained   Following Commands: Follows one step commands with increased time;Follows multi-step commands inconsistently     Problem Solving: Slow processing;Decreased initiation;Difficulty sequencing;Requires verbal cues;Requires tactile cues General Comments: A&Ox4; requires repetition and step by step cues for sequencing at times. Left inattention. Difficulty reading clock.   General Comments  Wife present during session. Encouraged wife to sit on left side to help address inattention.    Exercises     Shoulder Instructions      Home Living Family/patient expects to be discharged to:: Private residence (modular home) Living Arrangements: Spouse/significant other Available Help at Discharge: Family;Available 24 hours/day Type of Home: Mobile home Home Access: Stairs to enter Entrance Stairs-Number of Steps: 2 Entrance Stairs-Rails: Right Home Layout: One level     Bathroom Shower/Tub: Teacher, early years/pre: Standard     Home Equipment: Grab bars - tub/shower;Cane - single point;Walker - 2 wheels;Wheelchair - manual          Prior Functioning/Environment Level of Independence: Independent        Comments: Independent for ADLs, walking. Drives.        OT Problem List: Decreased strength;Decreased range of motion;Decreased activity tolerance;Impaired balance (sitting and/or standing);Impaired vision/perception;Decreased coordination;Decreased safety awareness;Decreased cognition;Decreased knowledge of use of DME or AE;Impaired UE functional use;Impaired tone;Impaired sensation      OT Treatment/Interventions: Self-care/ADL training;Therapeutic exercise;Energy conservation;Neuromuscular education;DME and/or AE instruction;Therapeutic activities;Cognitive remediation/compensation;Visual/perceptual remediation/compensation;Patient/family education;Balance training;Splinting;Manual therapy    OT Goals(Current goals can be found in the care  plan section) Acute Rehab OT Goals Patient Stated Goal: to return to PLOF OT Goal Formulation: With patient Time For Goal Achievement: 07/14/20 Potential to Achieve Goals: Good  OT Frequency: Min 2X/week   Barriers to D/C:            Co-evaluation  AM-PAC OT "6 Clicks" Daily Activity     Outcome Measure Help from another person eating meals?: Total Help from another person taking care of personal grooming?: A Lot Help from another person toileting, which includes using toliet, bedpan, or urinal?: Total Help from another person bathing (including washing, rinsing, drying)?: A Lot Help from another person to put on and taking off regular upper body clothing?: A Lot Help from another person to put on and taking off regular lower body clothing?: Total 6 Click Score: 9   End of Session Equipment Utilized During Treatment: Gait belt Nurse Communication: Mobility status  Activity Tolerance: Patient tolerated treatment well Patient left: in bed;with call bell/phone within reach;with bed alarm set  OT Visit Diagnosis: Other abnormalities of gait and mobility (R26.89);Hemiplegia and hemiparesis;Other symptoms and signs involving cognitive function;Other symptoms and signs involving the nervous system (R29.898) Hemiplegia - Right/Left: Left Hemiplegia - dominant/non-dominant: Non-Dominant Hemiplegia - caused by: Cerebral infarction                Time: 8984-2103 OT Time Calculation (min): 24 min Charges:  OT General Charges $OT Visit: 1 Visit OT Evaluation $OT Eval Moderate Complexity: [place], OT Acute Rehabilitation Services Pager [phone] Office [phone]   Raymondo Band 06/30/2020, 4:36 PM

## 2020-06-30 NOTE — Procedures (Signed)
Extubation Procedure Note  Patient Details:   Name: Tyler Richards DOB: 06-19-1951 MRN: [id number]   Airway Documentation:    Vent end date: 06/30/20 Vent end time: 1118   Evaluation  O2 sats: stable throughout Complications: No apparent complications Patient did tolerate procedure well. Bilateral Breath Sounds: Clear   Pt extubated to 3L [name] per MD order. Pt had positive cuff leak. Pt able to voice his name. RT to continue to monitor.  Vilinda Blanks 06/30/2020, 11:19 AM

## 2020-06-30 NOTE — Progress Notes (Signed)
Called and alerted Elink RN that the patient has been sinus brady and intermittently dropping down into the 20's.  Awaiting new orders.  Will continue to observe and act accordingly.

## 2020-06-30 NOTE — Evaluation (Signed)
Physical Therapy Evaluation Patient Details Name: Tyler Richards MRN: [id number] DOB: 06-15-1951 Today's Date: 06/30/2020   History of Present Illness  Patient is a 69 y/o male who presents with left sided weakness, right gaze and slurred speech. NIH:17. Head CT- dense Right MCA infarct. CTA- Right ICA and MCA occlusions. s/p right MCA thrombectomy and right ICA stent placement 06/29/20 with post procedure hemmorhage. No PMH.  Clinical Impression  Patient presents with left hemiplegia, left inattention, impaired sensation, impaired balance and impaired mobility s/p above. Pt lives at home with wife and reports being independent with ADLs/ambulation PTA. Today, pt requires Max A for bed mobility and partial standing. Able to sit EOB with Min guard assist for short periods with UE support; requires more support when fatigued or with dynamic tasks. Noted to have some cognitive deficits relating to problem solving, attention and following commands. Also noted to be bradycardic with HR as low as 37 bpm and as high as 87 bpm during activity. RN made aware. Would benefit from CIR to maximize independence and mobility prior to return home. Will follow acutely.     Follow Up Recommendations CIR;Supervision for mobility/OOB    Equipment Recommendations  Other (comment) (defer to post acute)    Recommendations for Other Services       Precautions / Restrictions Precautions Precautions: Fall;Other (comment) Precaution Comments: watch HR Restrictions Weight Bearing Restrictions: No      Mobility  Bed Mobility Overal bed mobility: Needs Assistance Bed Mobility: Rolling;Sidelying to Sit Rolling: Mod assist;+2 for physical assistance Sidelying to sit: Max assist;+2 for physical assistance;HOB elevated       General bed mobility comments: Step by step cues for sequencing; to push through RLE to scoot bottom, assist with LLE and elevate trunk with cues to reach for rail  Transfers Overall transfer  level: Needs assistance Equipment used: 2 person hand held assist Transfers: Sit to/from Stand Sit to Stand: Max assist;+2 physical assistance         General transfer comment: Assist of 2 to power to standing with cues for anterior translation. Flexed posture and not able to extend through trunk/hips despite cues.  Ambulation/Gait             General Gait Details: Unable  Stairs            Wheelchair Mobility    Modified Rankin (Stroke Patients Only) Modified Rankin (Stroke Patients Only) Pre-Morbid Rankin Score: No symptoms Modified Rankin: Severe disability     Balance Overall balance assessment: Needs assistance Sitting-balance support: Feet supported;Single extremity supported Sitting balance-Leahy Scale: Fair Sitting balance - Comments: Able to sit statically with close min guard and UE support, LOB in all directions with dynamic tasks/fatigue.   Standing balance support: During functional activity Standing balance-Leahy Scale: Zero Standing balance comment: max A of 2 to partially stand                             Pertinent Vitals/Pain Pain Assessment: No/denies pain    Home Living Family/patient expects to be discharged to:: Private residence (modular home) Living Arrangements: Spouse/significant other Available Help at Discharge: Family;Available 24 hours/day Type of Home: House Home Access: Stairs to enter Entrance Stairs-Rails: Right Entrance Stairs-Number of Steps: 2 Home Layout: One level Home Equipment: Grab bars - tub/shower;Cane - single point;Walker - 2 wheels;Wheelchair - manual      Prior Function Level of Independence: Independent  Comments: Independent for ADLs, walking. Drives.     Hand Dominance   Dominant Hand: Right    Extremity/Trunk Assessment   Upper Extremity Assessment Upper Extremity Assessment: Defer to OT evaluation;LUE deficits/detail LUE Deficits / Details: Increased tone noted,  otherwise flaccid LUE Sensation: decreased light touch LUE Coordination: decreased fine motor;decreased gross motor    Lower Extremity Assessment Lower Extremity Assessment: LLE deficits/detail LLE Deficits / Details: Grossly ~2-/5 hip flexion, 2+/5 knee extension, 2+/5 PF, 0/5 DF. LLE Sensation: decreased light touch;decreased proprioception LLE Coordination: decreased fine motor;decreased gross motor       Communication   Communication: Expressive difficulties (low raspy voice)  Cognition Arousal/Alertness: Awake/alert Behavior During Therapy: WFL for tasks assessed/performed Overall Cognitive Status: Impaired/Different from baseline Area of Impairment: Problem solving;Attention;Following commands                   Current Attention Level: Sustained   Following Commands: Follows one step commands with increased time;Follows multi-step commands inconsistently     Problem Solving: Slow processing;Decreased initiation;Difficulty sequencing;Requires verbal cues;Requires tactile cues General Comments: A&Ox4; requires repetition and step by step cues for sequencing at times. Left inattention. Difficulty reading clock.      General Comments General comments (skin integrity, edema, etc.): Wife present during session. Encouraged wife to sit on left side to help address inattention.    Exercises     Assessment/Plan    PT Assessment Patient needs continued PT services  PT Problem List Decreased strength;Decreased mobility;Impaired tone;Impaired sensation;Decreased balance;Decreased activity tolerance;Decreased range of motion;Decreased coordination;Cardiopulmonary status limiting activity       PT Treatment Interventions Therapeutic activities;Gait training;Therapeutic exercise;Patient/family education;Balance training;DME instruction;Neuromuscular re-education;Wheelchair mobility training;Manual techniques;Functional mobility training    PT Goals (Current goals can be  found in the Care Plan section)  Acute Rehab PT Goals Patient Stated Goal: to return to PLOF PT Goal Formulation: With patient/family Time For Goal Achievement: 07/14/20 Potential to Achieve Goals: Fair    Frequency Min 4X/week   Barriers to discharge        Co-evaluation               AM-PAC PT "6 Clicks" Mobility  Outcome Measure Help needed turning from your back to your side while in a flat bed without using bedrails?: A Lot Help needed moving from lying on your back to sitting on the side of a flat bed without using bedrails?: A Lot Help needed moving to and from a bed to a chair (including a wheelchair)?: Total Help needed standing up from a chair using your arms (e.g., wheelchair or bedside chair)?: Total Help needed to walk in hospital room?: Total Help needed climbing 3-5 steps with a railing? : Total 6 Click Score: 8    End of Session Equipment Utilized During Treatment: Gait belt Activity Tolerance: Patient tolerated treatment well;Patient limited by fatigue Patient left: in bed;with call bell/phone within reach;with bed alarm set;with family/visitor present;with nursing/sitter in room Nurse Communication: Mobility status PT Visit Diagnosis: Hemiplegia and hemiparesis Hemiplegia - Right/Left: Left Hemiplegia - dominant/non-dominant: Non-dominant Hemiplegia - caused by: Cerebral infarction    Time: 1305-1330 PT Time Calculation (min) (ACUTE ONLY): 25 min   Charges:   PT Evaluation $PT Eval Moderate Complexity: 1 Mod          Marisa Severin, PT, DPT Acute Rehabilitation Services Pager [phone] Office [phone]      Marguarite Arbour A [place] 06/30/2020, 2:00 PM

## 2020-06-30 NOTE — Progress Notes (Signed)
Rehab Admissions Coordinator Note:  Patient was screened by Cleatrice Burke for appropriateness for an Inpatient Acute Rehab Consult per therapy recs. .  At this time, we are recommending Inpatient Rehab consult. I will place order per protocol.  Cleatrice Burke RN MSN 06/30/2020, 2:38 PM  I can be reached at [phone].

## 2020-06-30 NOTE — Progress Notes (Signed)
Lower extremity venous & Carotid duplex has been completed.   Preliminary results in CV Proc.   [name] 06/30/2020 2:23 PM

## 2020-06-30 NOTE — Progress Notes (Signed)
  Echocardiogram 2D Echocardiogram has been performed.  Tyler Richards 06/30/2020, 9:56 AM

## 2020-06-30 NOTE — Progress Notes (Signed)
eLink Physician-Brief Progress Note Patient Name: Tyler Richards DOB: [birth year] MRN: [id number]   Date of Service  06/30/2020  HPI/Events of Note  Bradycardia - HR dipping into 30's and 40's while asleep. Currently on Norepinephrine IV infusion for goal SBP = 140-160.  eICU Interventions  Plan: 1. Add Dopamine IV infusion at 2.5 mcg/kg/min.     Intervention Category Major Interventions: Arrhythmia - evaluation and management  Amarise Lillo [place] 06/30/2020, 11:44 PM

## 2020-06-30 NOTE — Progress Notes (Signed)
Transported to MRI and back to [id number] with no complications.

## 2020-06-30 NOTE — Progress Notes (Signed)
SLP Cancellation Note  Patient Details Name: Tyler Richards MRN: [id number] DOB: 1951-02-17   Cancelled treatment:       Reason Eval/Treat Not Completed: Medical issues which prohibited therapy (pt on vent this am). Will follow.    [name], M.A. [place] Acute Rehabilitation Services Pager [phone] Office [phone]  06/30/2020, 8:13 AM

## 2020-06-30 NOTE — Progress Notes (Signed)
STROKE TEAM PROGRESS NOTE   SUBJECTIVE (INTERVAL HISTORY) His wife and RN are at the bedside.  Overall his condition is gradually improving. Pt off sedation now and much more awake alert, able to follow simple commands and plan to extubate soon. MRI showed right MCA patchy infarcts with HT and MCA open but right ICA still occluded. CUS pending. On ASA and brilinta now. BP goal 120-160.    OBJECTIVE Temp:  [97.3 F (36.3 C)-99.5 F (37.5 C)] 98.7 F (37.1 C) (10/05 0800) Pulse Rate:  [40-86] 46 (10/05 1100) Cardiac Rhythm: Sinus bradycardia (10/05 0800) Resp:  [11-21] 16 (10/05 1100) BP: (100-235)/(23-209) 137/53 (10/05 1100) SpO2:  [99 %-100 %] 100 % (10/05 1100) FiO2 (%):  [40 %-100 %] 40 % (10/05 0905) Weight:  [85.1 kg] 85.1 kg (10/04 1730)  Recent Labs  Lab 06/29/20 0904  GLUCAP 88   Recent Labs  Lab 06/29/20 0904 06/29/20 0915 06/29/20 1738 06/30/20 0043  NA 136 138 138 138  K 5.0 4.5 3.8 3.9  CL 103 105  --  106  CO2 20*  --   --  20*  GLUCOSE 104* 105*  --  113*  BUN 14 17  --  10  CREATININE 0.87 0.70  --  0.89  CALCIUM 9.1  --   --  8.4*   Recent Labs  Lab 06/29/20 0904  AST 36  ALT 17  ALKPHOS 61  BILITOT 1.8*  PROT 6.5  ALBUMIN 3.3*   Recent Labs  Lab 06/29/20 0904 06/29/20 0915 06/29/20 1738 06/30/20 0043  WBC 9.0  --   --  9.7  NEUTROABS 6.8  --   --  7.2  HGB 13.9 14.3 11.9* 12.2*  HCT 41.2 42.0 35.0* 36.2*  MCV 96.5  --   --  97.1  PLT 320  --   --  380   No results for input(s): CKTOTAL, CKMB, CKMBINDEX, TROPONINI in the last 168 hours. Recent Labs    06/29/20 0904  LABPROT 12.4  INR 1.0   Recent Labs    06/29/20 1749  COLORURINE YELLOW  LABSPEC 1.044*  PHURINE 5.0  GLUCOSEU NEGATIVE  HGBUR MODERATE*  BILIRUBINUR NEGATIVE  KETONESUR NEGATIVE  PROTEINUR NEGATIVE  NITRITE NEGATIVE  LEUKOCYTESUR NEGATIVE       Component Value Date/Time   CHOL 149 06/30/2020 0043   TRIG 167 (H) 06/30/2020 0043   TRIG 168 (H) 06/30/2020  0043   HDL 23 (L) 06/30/2020 0043   CHOLHDL 6.5 06/30/2020 0043   VLDL 33 06/30/2020 0043   LDLCALC 93 06/30/2020 0043   Lab Results  Component Value Date   HGBA1C 5.6 06/30/2020      Component Value Date/Time   LABOPIA NONE DETECTED 06/29/2020 1749   COCAINSCRNUR NONE DETECTED 06/29/2020 1749   LABBENZ NONE DETECTED 06/29/2020 1749   AMPHETMU NONE DETECTED 06/29/2020 1749   THCU NONE DETECTED 06/29/2020 1749   LABBARB NONE DETECTED 06/29/2020 1749    Recent Labs  Lab 06/29/20 0904  ETH <10    I have personally reviewed the radiological images below and agree with the radiology interpretations.  CT Code Stroke CTA Head W/WO contrast  Result Date: 06/29/2020 CLINICAL DATA:  Hyperdense vessel and stroke-like symptoms EXAM: CT ANGIOGRAPHY HEAD AND NECK CT PERFUSION BRAIN TECHNIQUE: Multidetector CT imaging of the head and neck was performed using the standard protocol during bolus administration of intravenous contrast. Multiplanar CT image reconstructions and MIPs were obtained to evaluate the vascular anatomy. Carotid stenosis measurements (when  applicable) are obtained utilizing NASCET criteria, using the distal internal carotid diameter as the denominator. Multiphase CT imaging of the brain was performed following IV bolus contrast injection. Subsequent parametric perfusion maps were calculated using RAPID software. CONTRAST:  Dose is not yet known COMPARISON:  None. FINDINGS: CTA NECK FINDINGS Aortic arch: Mild atheromatous changes. Three vessel branching. No acute finding. Right carotid system: Atherosclerotic plaque about the bifurcation with occluded ICA bulb. No flow seen within the ICA in the neck. Left carotid system: Atheromatous plaque at the bifurcation and bulb. No stenosis or ulceration. Negative for beading. Vertebral arteries: Low-density plaque at the left subclavian origin. No flow limiting subclavian stenosis. Plaque at the left vertebral origin without stenosis,  beading, or dissection. Skeleton: Focal advanced C5-6 disc degeneration with ridging causing biforaminal impingement. Ordinary facet osteoarthritis. Other neck: No acute or aggressive finding. Right-sided chronic sinusitis with pattern middle meatus obstruction. Upper chest: Granulomatous nodal calcifications. There is also a calcified granuloma appearance in the left upper lobe. Review of the MIP images confirms the above findings CTA HEAD FINDINGS Anterior circulation: No flow seen in the right carotid or MCA branches. There is distal M2 M3 branch reconstitution. Calcified plaque along the left carotid siphon. No MCA branch occlusion, aneurysm, or beading. Posterior circulation: The vertebral and basilar arteries are smooth and widely patent. High-grade atheromatous narrowings of the bilateral PCA, P3 segment on the right and upper first order branch on the left. Venous sinuses: Patent as permitted by contrast timing Anatomic variants: None significant Review of the MIP images confirms the above findings CT Brain Perfusion Findings: ASPECTS: 9 CBF (<30%) Volume: 42mL Perfusion (Tmax>6.0s) volume: 124mL Mismatch Volume: 118mL Infarction Location:Right basal ganglia and lesser extensive lateral frontal cortex infarct. Critical Value/emergent results were called by telephone at the time of interpretation on 06/29/2020 at 9:37 am to provider ERIC [facility] , who verbally acknowledged these results. IMPRESSION: 1. Emergent large vessel occlusion with no flow seen in the right internal carotid or proximal MCA. There is a 36 cc core infarct and 142 cc of penumbra by CT perfusion. 2. Atherosclerosis without flow limiting stenosis of the other major vessels. 3. Bilateral high-grade atheromatous PCA narrowings, more proximal on the right. 4. 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 Electronically Signed   By: Monte Fantasia M.D.   On: 06/29/2020 09:53   CT HEAD WO CONTRAST  Result Date:  06/29/2020 CLINICAL DATA:  Follow-up stroke and subsequent intervention. EXAM: CT HEAD WITHOUT CONTRAST TECHNIQUE: Contiguous axial images were obtained from the base of the skull through the vertex without intravenous contrast. COMPARISON:  Earlier same day FINDINGS: Brain: 3.9 x 3.0 x 3.0 cm hyperdense region in the right posterior basal ganglia most consistent with a parenchymal hematoma, given small internal fluid levels. Mild mass effect and surrounding edema. No midline shift. Chronic ischemic changes in the right posterior frontal region as seen previously. Vascular: No other new vascular finding. Skull: Negative Sinuses/Orbits: Opacified right maxillary and ethmoid sinuses. Orbits negative. Other: None IMPRESSION: 1. 3.9 x 3.0 x 3.0 cm hyperdense region in the right posterior basal ganglia most consistent with parenchymal hematoma, given small internal fluid levels. Mild mass effect and surrounding edema. No midline shift. 2. Chronic ischemic changes in the right posterior frontal region as seen previously. 3. Opacified right maxillary and ethmoid sinuses. Electronically Signed   By: Nelson Chimes M.D.   On: 06/29/2020 18:48   CT Code Stroke CTA Neck W/WO contrast  Result Date: 06/29/2020 CLINICAL  DATA:  Hyperdense vessel and stroke-like symptoms EXAM: CT ANGIOGRAPHY HEAD AND NECK CT PERFUSION BRAIN TECHNIQUE: Multidetector CT imaging of the head and neck was performed using the standard protocol during bolus administration of intravenous contrast. Multiplanar CT image reconstructions and MIPs were obtained to evaluate the vascular anatomy. Carotid stenosis measurements (when applicable) are obtained utilizing NASCET criteria, using the distal internal carotid diameter as the denominator. Multiphase CT imaging of the brain was performed following IV bolus contrast injection. Subsequent parametric perfusion maps were calculated using RAPID software. CONTRAST:  Dose is not yet known COMPARISON:  None.  FINDINGS: CTA NECK FINDINGS Aortic arch: Mild atheromatous changes. Three vessel branching. No acute finding. Right carotid system: Atherosclerotic plaque about the bifurcation with occluded ICA bulb. No flow seen within the ICA in the neck. Left carotid system: Atheromatous plaque at the bifurcation and bulb. No stenosis or ulceration. Negative for beading. Vertebral arteries: Low-density plaque at the left subclavian origin. No flow limiting subclavian stenosis. Plaque at the left vertebral origin without stenosis, beading, or dissection. Skeleton: Focal advanced C5-6 disc degeneration with ridging causing biforaminal impingement. Ordinary facet osteoarthritis. Other neck: No acute or aggressive finding. Right-sided chronic sinusitis with pattern middle meatus obstruction. Upper chest: Granulomatous nodal calcifications. There is also a calcified granuloma appearance in the left upper lobe. Review of the MIP images confirms the above findings CTA HEAD FINDINGS Anterior circulation: No flow seen in the right carotid or MCA branches. There is distal M2 M3 branch reconstitution. Calcified plaque along the left carotid siphon. No MCA branch occlusion, aneurysm, or beading. Posterior circulation: The vertebral and basilar arteries are smooth and widely patent. High-grade atheromatous narrowings of the bilateral PCA, P3 segment on the right and upper first order branch on the left. Venous sinuses: Patent as permitted by contrast timing Anatomic variants: None significant Review of the MIP images confirms the above findings CT Brain Perfusion Findings: ASPECTS: 9 CBF (<30%) Volume: 78mL Perfusion (Tmax>6.0s) volume: 130mL Mismatch Volume: 161mL Infarction Location:Right basal ganglia and lesser extensive lateral frontal cortex infarct. Critical Value/emergent results were called by telephone at the time of interpretation on 06/29/2020 at 9:37 am to provider ERIC [facility] , who verbally acknowledged these results.  IMPRESSION: 1. Emergent large vessel occlusion with no flow seen in the right internal carotid or proximal MCA. There is a 36 cc core infarct and 142 cc of penumbra by CT perfusion. 2. Atherosclerosis without flow limiting stenosis of the other major vessels. 3. Bilateral high-grade atheromatous PCA narrowings, more proximal on the right. 4. 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 Electronically Signed   By: Monte Fantasia M.D.   On: 06/29/2020 09:53   MR BRAIN WO CONTRAST  Result Date: 06/30/2020 CLINICAL DATA:  Stroke follow-up EXAM: MRI HEAD WITHOUT CONTRAST TECHNIQUE: Multiplanar, multiecho pulse sequences of the brain and surrounding structures were obtained without intravenous contrast. COMPARISON:  Head CT and CTA from yesterday FINDINGS: Brain: Acute infarction at the right stratum and patchy along the insula and lateral frontal cortex. The area of infarction appears similar to the area of pre treatment core infarct. Hematoma centered at the right putamen with dimensions better assessed by prior CT, grossly similar at to 3.5 cm anterior to posterior. Correlating to fluid fluid level, there is a posterior dense clot appearance and more heterogeneous anterior component. On gradient imaging petechial hemorrhage is seen along the right MCA watershed. No hydrocephalus or shift. Remote high right frontal cortex infarct anteriorly. Vascular: No flow voids seen  in the left ICA beginning in the upper cervical spine. There is normalization by the supraclinoid segment. Skull and upper cervical spine: No focal marrow lesion Sinuses/Orbits: Right middle meatus obstruction with frontal, ethmoid, and especially maxillary sinusitis on the right. IMPRESSION: 1. Acute infarct at the right basal ganglia with nonprogressive hemorrhage when correlated with prior CT. Less extensive patchy acute cortical infarct in the right MCA territory. The areas of acute infarction correlate well with  pretreatment CTP. 2. 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 Electronically Signed   By: Monte Fantasia M.D.   On: 06/30/2020 04:22   CT Code Stroke Cerebral Perfusion with contrast  Result Date: 06/29/2020 CLINICAL DATA:  Hyperdense vessel and stroke-like symptoms EXAM: CT ANGIOGRAPHY HEAD AND NECK CT PERFUSION BRAIN TECHNIQUE: Multidetector CT imaging of the head and neck was performed using the standard protocol during bolus administration of intravenous contrast. Multiplanar CT image reconstructions and MIPs were obtained to evaluate the vascular anatomy. Carotid stenosis measurements (when applicable) are obtained utilizing NASCET criteria, using the distal internal carotid diameter as the denominator. Multiphase CT imaging of the brain was performed following IV bolus contrast injection. Subsequent parametric perfusion maps were calculated using RAPID software. CONTRAST:  Dose is not yet known COMPARISON:  None. FINDINGS: CTA NECK FINDINGS Aortic arch: Mild atheromatous changes. Three vessel branching. No acute finding. Right carotid system: Atherosclerotic plaque about the bifurcation with occluded ICA bulb. No flow seen within the ICA in the neck. Left carotid system: Atheromatous plaque at the bifurcation and bulb. No stenosis or ulceration. Negative for beading. Vertebral arteries: Low-density plaque at the left subclavian origin. No flow limiting subclavian stenosis. Plaque at the left vertebral origin without stenosis, beading, or dissection. Skeleton: Focal advanced C5-6 disc degeneration with ridging causing biforaminal impingement. Ordinary facet osteoarthritis. Other neck: No acute or aggressive finding. Right-sided chronic sinusitis with pattern middle meatus obstruction. Upper chest: Granulomatous nodal calcifications. There is also a calcified granuloma appearance in the left upper lobe. Review of the MIP images confirms the above findings CTA  HEAD FINDINGS Anterior circulation: No flow seen in the right carotid or MCA branches. There is distal M2 M3 branch reconstitution. Calcified plaque along the left carotid siphon. No MCA branch occlusion, aneurysm, or beading. Posterior circulation: The vertebral and basilar arteries are smooth and widely patent. High-grade atheromatous narrowings of the bilateral PCA, P3 segment on the right and upper first order branch on the left. Venous sinuses: Patent as permitted by contrast timing Anatomic variants: None significant Review of the MIP images confirms the above findings CT Brain Perfusion Findings: ASPECTS: 9 CBF (<30%) Volume: 61mL Perfusion (Tmax>6.0s) volume: 134mL Mismatch Volume: 19mL Infarction Location:Right basal ganglia and lesser extensive lateral frontal cortex infarct. Critical Value/emergent results were called by telephone at the time of interpretation on 06/29/2020 at 9:37 am to provider ERIC [facility] , who verbally acknowledged these results. IMPRESSION: 1. Emergent large vessel occlusion with no flow seen in the right internal carotid or proximal MCA. There is a 36 cc core infarct and 142 cc of penumbra by CT perfusion. 2. Atherosclerosis without flow limiting stenosis of the other major vessels. 3. Bilateral high-grade atheromatous PCA narrowings, more proximal on the right. 4. 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 Electronically Signed   By: Monte Fantasia M.D.   On: 06/29/2020 09:53   Portable Chest xray  Result Date: 06/30/2020 CLINICAL DATA:  Respiratory failure.  Stroke. EXAM: PORTABLE CHEST 1  VIEW COMPARISON:  06/29/2020 FINDINGS: 0538 hours. Endotracheal tube tip is 7.4 cm above the base of the carina. The lungs are clear without focal pneumonia, edema, pneumothorax or pleural effusion. The cardiopericardial silhouette is within normal limits for size. The visualized bony structures of the thorax show no acute abnormality. Telemetry leads overlie  the chest. IMPRESSION: No acute cardiopulmonary findings. Electronically Signed   By: Misty Stanley M.D.   On: 06/30/2020 07:30   DG CHEST PORT 1 VIEW  Result Date: 06/29/2020 CLINICAL DATA:  Code stroke with coiling intubated EXAM: PORTABLE CHEST 1 VIEW COMPARISON:  None. FINDINGS: Endotracheal tube tip is about 4.2 cm superior to the carina. Esophageal tube tip is below the diaphragm but incompletely visualized. No focal opacity or pleural effusion. Normal cardiac size. No pneumothorax. IMPRESSION: Endotracheal tube tip about 4.2 cm superior to the carina. Lungs grossly clear Electronically Signed   By: Donavan Foil M.D.   On: 06/29/2020 16:00   CT HEAD CODE STROKE WO CONTRAST  Result Date: 06/29/2020 CLINICAL DATA:  Code stroke.  Slurred speech and left facial droop EXAM: CT HEAD WITHOUT CONTRAST TECHNIQUE: Contiguous axial images were obtained from the base of the skull through the vertex without intravenous contrast. COMPARISON:  None. FINDINGS: Brain: Small remote appearing cortically based infarcts in the superior right frontal lobe. No hemorrhage, hydrocephalus, or masslike finding. Vascular: Hyperdense distal right ICA to MCA branches. Atherosclerotic calcification. Skull: Negative Sinuses/Orbits: Right maxillary, ethmoid, and frontal sinus opacification with sclerotic wall thickening at the maxillary sinus. Other: Critical Value/emergent results were called by telephone at the time of interpretation on 06/29/2020 at 9:23 am to provider [place] , who verbally acknowledged these results. ASPECTS [facility] Stroke Program Early CT Score) - Ganglionic level infarction (caudate, lentiform nuclei, internal capsule, insula, M1-M3 cortex): Posterior putamen appears blunted compared to the left. Equivocal for small insular cortex infarct. - Supraganglionic infarction (M4-M6 cortex): 3, when accounting for chronic infarct Total score (0-10 with 10 being normal): 9, when accounting for chronic infarct  IMPRESSION: 1. Hyperdense distal right ICA and proximal MCA. 2. Blunted appearance of the posterior right putamen. Chronic right high frontal cortex infarcts. ASPECTS is 9 when excluding the chronic changes. Electronically Signed   By: Monte Fantasia M.D.   On: 06/29/2020 09:25    PHYSICAL EXAM  Temp:  [97.3 F (36.3 C)-99.5 F (37.5 C)] 98.7 F (37.1 C) (10/05 0800) Pulse Rate:  [40-86] 53 (10/05 1117) Resp:  [11-21] 19 (10/05 1117) BP: (100-235)/(23-209) 151/62 (10/05 1117) SpO2:  [99 %-100 %] 100 % (10/05 1117) FiO2 (%):  [40 %-100 %] 40 % (10/05 0905) Weight:  [85.1 kg] 85.1 kg (10/04 1730)  General - Well nourished, well developed, intubated off sedation.  Ophthalmologic - fundi not visualized due to noncooperation.  Cardiovascular - Regular rate and rhythm.  Neuro - intubated off sedation now, eyes open, able to follow simple commands. Eyes in right gaze perference position but able to cross midline and left gaze incomplete, blinking to visual threat on the right but not on the left, PERRL. Facial symmetry not able to test due to ET tube.  Tongue protrusion midline as far as I can tell. RUE and RLE at least 4/5. LUE proximal 1+/5 and distal 0/5, LLE 3-/5 proximal and 0/5 distally. DTR 1+ and no babinski. Sensation symmetrical per pt, coordination intact on the right FTN and gait not tested.   ASSESSMENT/PLAN Tyler Richards is a 69 y.o. male with history of gout admitted  for left-sided weakness, right gaze, left facial droop with fall. No tPA given due to outside window.    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 s/p IR with TICI2c and carotid stenting, secondary to large vessel disease source  CT head right frontal old infarcts, right MCA hyperdensity  CTA head and neck right ICA and MCA occlusion, right P1 stenosis, left VA origin atherosclerosis  CT perfusion positive for large penumbra  CT head right posterior BG hematoma  MRI right MCA  scattered infarcts with hemorrhagic conversion, right MCA patent but right ICA reoccluded  CT repeat in am  Carotid Doppler confirmed right ICA reoccluded  2D Echo EF 50 to 55%  LE venous Doppler no DVT  LDL 93  HgbA1c 5.6  SCDs for VTE prophylaxis - consider heparin subq if CT repeat HT stable in am  No antithrombotic prior to admission, now on aspirin 81 mg daily and Brilinta (ticagrelor) 90 mg bid. May consider monotherapy given HT and re-occluded ICA.  Ongoing aggressive stroke risk factor management  Therapy recommendations:  CIR  Disposition:  pending  Carotid occlusion  CTA head and neck right ICA and MCA occlusion  S/p carotid stenting due to severe athero but repeat carotid Doppler confirmed right ICA reoccluded  On ASA and brilinta  Hypotension and bradycardia . Likely related to carotid stenting  BP goal 120-160  On Levophed now  Long term BP goal 130-150 given occluded right ICA  Hyperlipidemia  Home meds: None  LDL 93, goal < 70  Consider statin once p.o. access  Continue statin at discharge  Dysphagia  Currently n.p.o.  Did not pass swallow  Speech to follow  Consider core track in a.m. if failed swallow  Other Stroke Risk Factors    Other Active Problems  Gout  Hospital day # 1  This patient is critically ill due to right MCA and ICA occlusion, status post thrombectomy and carotid stenting, carotid reocclusion, hemorrhagic conversion, respiratory failure and at significant risk of neurological worsening, death form recurrent stroke, hematoma expansion, brain herniation, cerebral edema, seizure. This patient's care requires constant monitoring of vital signs, hemodynamics, respiratory and cardiac monitoring, review of multiple databases, neurological assessment, discussion with family, other specialists and medical decision making of high complexity. I spent 40 minutes of neurocritical care time in the care of this patient. I had  long discussion with wife at bedside, updated pt current condition, treatment plan and potential prognosis, and answered all the questions.  She expressed understanding and appreciation.    Rosalin Hawking, MD PhD Stroke Neurology 06/30/2020 11:16 AM    To contact Stroke Continuity provider, please refer to [email or web address] After hours, contact General Neurology

## 2020-07-01 ENCOUNTER — Inpatient Hospital Stay (HOSPITAL_COMMUNITY): Payer: Medicare Other

## 2020-07-01 DIAGNOSIS — I6601 Occlusion and stenosis of right middle cerebral artery: Secondary | ICD-10-CM | POA: Diagnosis not present

## 2020-07-01 DIAGNOSIS — R001 Bradycardia, unspecified: Secondary | ICD-10-CM | POA: Diagnosis not present

## 2020-07-01 LAB — CBC
HCT: 34.2 % — ABNORMAL LOW (ref 39.0–52.0)
Hemoglobin: 11.7 g/dL — ABNORMAL LOW (ref 13.0–17.0)
MCH: 33.9 pg (ref 26.0–34.0)
MCHC: 34.2 g/dL (ref 30.0–36.0)
MCV: 99.1 fL (ref 80.0–100.0)
Platelets: 329 10*3/uL (ref 150–400)
RBC: 3.45 MIL/uL — ABNORMAL LOW (ref 4.22–5.81)
RDW: 12.9 % (ref 11.5–15.5)
WBC: 11.2 10*3/uL — ABNORMAL HIGH (ref 4.0–10.5)
nRBC: 0 % (ref 0.0–0.2)

## 2020-07-01 LAB — BASIC METABOLIC PANEL
Anion gap: 11 (ref 5–15)
BUN: 8 mg/dL (ref 8–23)
CO2: 21 mmol/L — ABNORMAL LOW (ref 22–32)
Calcium: 8.5 mg/dL — ABNORMAL LOW (ref 8.9–10.3)
Chloride: 106 mmol/L (ref 98–111)
Creatinine, Ser: 0.85 mg/dL (ref 0.61–1.24)
GFR calc non Af Amer: >60 mL/min (ref >60)
Glucose, Bld: 107 mg/dL — ABNORMAL HIGH (ref 70–99)
Potassium: 3.6 mmol/L (ref 3.5–5.1)
Sodium: 138 mmol/L (ref 135–145)

## 2020-07-01 LAB — TRIGLYCERIDES: Triglycerides: 91 mg/dL (ref <150)

## 2020-07-01 MED ORDER — DOCUSATE SODIUM 50 MG/5ML PO LIQD
100.0000 mg | Freq: Two times a day (BID) | ORAL | Status: DC
  Administered 2020-07-01 – 2020-07-09 (×9): 100 mg via ORAL
  Filled 2020-07-01 (×10): qty 10

## 2020-07-01 MED ORDER — MIDODRINE HCL 5 MG PO TABS: 5.0000 mg | ORAL_TABLET | Freq: Three times a day (TID) | ORAL | Status: DC

## 2020-07-01 MED ORDER — ATORVASTATIN CALCIUM 40 MG PO TABS
40.0000 mg | ORAL_TABLET | Freq: Every day | ORAL | Status: DC
  Administered 2020-07-02 – 2020-07-09 (×8): 40 mg via ORAL
  Filled 2020-07-01 (×8): qty 1

## 2020-07-01 MED ORDER — PANTOPRAZOLE SODIUM 40 MG PO TBEC: 40.0000 mg | DELAYED_RELEASE_TABLET | Freq: Every day | ORAL | Status: DC

## 2020-07-01 MED ORDER — HEPARIN SODIUM (PORCINE) 5000 UNIT/ML IJ SOLN
5000.0000 [IU] | Freq: Three times a day (TID) | INTRAMUSCULAR | Status: DC
  Administered 2020-07-02 – 2020-07-03 (×5): 5000 [IU] via SUBCUTANEOUS
  Filled 2020-07-01 (×5): qty 1

## 2020-07-01 MED ORDER — MIDODRINE HCL 5 MG PO TABS
5.0000 mg | ORAL_TABLET | Freq: Three times a day (TID) | ORAL | Status: DC
  Administered 2020-07-01 – 2020-07-02 (×3): 5 mg via ORAL
  Filled 2020-07-01 (×3): qty 1

## 2020-07-01 MED ORDER — POLYETHYLENE GLYCOL 3350 17 G PO PACK
17.0000 g | PACK | Freq: Every day | ORAL | Status: DC
  Administered 2020-07-02 – 2020-07-09 (×5): 17 g via ORAL
  Filled 2020-07-01 (×5): qty 1

## 2020-07-01 NOTE — Progress Notes (Signed)
eLink Physician-Brief Progress Note Patient Name: Tyler Richards DOB: Jan 25, 1951 MRN: [id number]   Date of Service  07/01/2020  HPI/Events of Note  Bradycardia - HR dropping episodically to 27. BP = 137/52 with MAP = 70.   eICU Interventions  Plan: 1. Increase Dopamine IV infusion to 5 mcg/kg/min.     Intervention Category Major Interventions: Arrhythmia - evaluation and management  Declyn Delsol Eugene 07/01/2020, 2:35 AM

## 2020-07-01 NOTE — TOC Initial Note (Signed)
Transition of Care [facility]) - Initial/Assessment Note    Patient Details  Name: Tyler Richards MRN: [id number] Date of Birth: Jun 09, 1951  Transition of Care [facility]) CM/SW Contact:    Joanne Chars, LCSW Phone Number: 07/01/2020, 2:42 PM  Clinical Narrative:  CSW spoke with pt regarding potential DC plan of CIR vs SNF.  Pt spoke to CIR/Barbara earlier today and is aware that it is possible he will not be able to go to CIR and is agreeable for SNF to be alternate/back up plan.  Choice document given.  Permission given to speak with wife and to send out information on the hub.  Pt is vaccinated.                 Expected Discharge Plan: IP Rehab Facility Barriers to Discharge: Continued Medical Work up, SNF Pending bed offer, Other (comment) (possible CIR)   Patient Goals and CMS Choice Patient states their goals for this hospitalization and ongoing recovery are:: "get back to normal" CMS Medicare.gov Compare Post Acute Care list provided to:: Patient Choice offered to / list presented to : Patient  Expected Discharge Plan and Services Expected Discharge Plan: [place]     Post Acute Care Choice: IP Rehab Living arrangements for the past 2 months: Single Family Home                                      Prior Living Arrangements/Services Living arrangements for the past 2 months: Single Family Home Lives with:: Spouse Patient language and need for interpreter reviewed:: Yes Do you feel safe going back to the place where you live?: Yes      Need for Family Participation in Patient Care: Yes (Comment) Care giver support system in place?: Yes (comment) (wife)   Criminal Activity/Legal Involvement Pertinent to Current Situation/Hospitalization: No - Comment as needed  Activities of Daily Living Home Assistive Devices/Equipment: Grab bars around toilet, Grab bars in shower, Wheelchair, Environmental consultant (specify type), Eyeglasses ADL Screening (condition at time of  admission) Patient's cognitive ability adequate to safely complete daily activities?: Yes Is the patient deaf or have difficulty hearing?: No Does the patient have difficulty seeing, even when wearing glasses/contacts?: No Does the patient have difficulty concentrating, remembering, or making decisions?: No Patient able to express need for assistance with ADLs?: Yes Does the patient have difficulty dressing or bathing?: No Independently performs ADLs?: Yes (appropriate for developmental age) Does the patient have difficulty walking or climbing stairs?: No Weakness of Legs: None Weakness of Arms/Hands: None  Permission Sought/Granted Permission sought to share information with : Facility Sport and exercise psychologist, Family Supports Permission granted to share information with : Yes, Verbal Permission Granted  Share Information with NAME: wife [name]  Permission granted to share info w AGENCY: SNF        Emotional Assessment Appearance:: Appears stated age Attitude/Demeanor/Rapport: Lethargic Affect (typically observed): Flat Orientation: : Oriented to Self, Oriented to Place, Oriented to  Time, Oriented to Situation Alcohol / Substance Use: Not Applicable Psych Involvement: No (comment)  Admission diagnosis:  Respiratory failure ([place]) [J96.90] Stroke ([place]) [I63.9] Stroke (cerebrum) ([place]) [I63.9] Arterial ischemic stroke, MCA, right, acute ([place]) [I63.511] Intubation of airway performed without difficulty [Z78.9] Middle cerebral artery embolism, 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 Arterial ischemic stroke, MCA, right, acute ([place]) 06/29/2020  . Stroke (cerebrum) ([place]) 06/29/2020  . Middle  cerebral artery embolism, right 06/29/2020  . Acute encephalopathy   . Hypertension   . Acute respiratory failure with hypoxia ([place])    PCP:  Patient, No Pcp Per Pharmacy:   [facility] [place], [address] AT [place] [address] Phone: [phone] Fax: [phone]     Social Determinants of Health (SDOH) Interventions    Readmission Risk Interventions No flowsheet data found.

## 2020-07-01 NOTE — Progress Notes (Signed)
STROKE TEAM PROGRESS NOTE   SUBJECTIVE (INTERVAL HISTORY) Wife and RN at bedside.  Patient sitting in chair, awake alert, orientated, however, still has right gaze preference, left hemianopia, left sensory neglect and left-sided weakness.  Still on Levophed for BP measurement.  Overnight has bradycardia, likely due to cardiac stent.  Cardiology consulted.  OBJECTIVE Temp:  [98.5 F (36.9 C)-100.6 F (38.1 C)] 99.7 F (37.6 C) (10/06 0400) Pulse Rate:  [41-90] 61 (10/06 1015) Cardiac Rhythm: Normal sinus rhythm (10/06 0400) Resp:  [9-33] 23 (10/06 1015) BP: (98-179)/(38-92) 155/71 (10/06 1015) SpO2:  [96 %-100 %] 100 % (10/06 1015)  Recent Labs  Lab 06/29/20 0904  GLUCAP 88   Recent Labs  Lab 06/29/20 0904 06/29/20 0915 06/29/20 1738 06/30/20 0043 07/01/20 0433  NA 136 138 138 138 138  K 5.0 4.5 3.8 3.9 3.6  CL 103 105  --  106 106  CO2 20*  --   --  20* 21*  GLUCOSE 104* 105*  --  113* 107*  BUN 14 17  --  10 8  CREATININE 0.87 0.70  --  0.89 0.85  CALCIUM 9.1  --   --  8.4* 8.5*   Recent Labs  Lab 06/29/20 0904  AST 36  ALT 17  ALKPHOS 61  BILITOT 1.8*  PROT 6.5  ALBUMIN 3.3*   Recent Labs  Lab 06/29/20 0904 06/29/20 0915 06/29/20 1738 06/30/20 0043 07/01/20 0433  WBC 9.0  --   --  9.7 11.2*  NEUTROABS 6.8  --   --  7.2  --   HGB 13.9 14.3 11.9* 12.2* 11.7*  HCT 41.2 42.0 35.0* 36.2* 34.2*  MCV 96.5  --   --  97.1 99.1  PLT 320  --   --  380 329   No results for input(s): CKTOTAL, CKMB, CKMBINDEX, TROPONINI in the last 168 hours. Recent Labs    06/29/20 0904  LABPROT 12.4  INR 1.0   Recent Labs    06/29/20 1749  COLORURINE YELLOW  LABSPEC 1.044*  PHURINE 5.0  GLUCOSEU NEGATIVE  HGBUR MODERATE*  BILIRUBINUR NEGATIVE  KETONESUR NEGATIVE  PROTEINUR NEGATIVE  NITRITE NEGATIVE  LEUKOCYTESUR NEGATIVE       Component Value Date/Time   CHOL 149 06/30/2020 0043   TRIG 91 07/01/2020 0433   HDL 23 (L) 06/30/2020 0043   CHOLHDL 6.5  06/30/2020 0043   VLDL 33 06/30/2020 0043   LDLCALC 93 06/30/2020 0043   Lab Results  Component Value Date   HGBA1C 5.6 06/30/2020      Component Value Date/Time   LABOPIA NONE DETECTED 06/29/2020 1749   COCAINSCRNUR NONE DETECTED 06/29/2020 1749   LABBENZ NONE DETECTED 06/29/2020 1749   AMPHETMU NONE DETECTED 06/29/2020 1749   THCU NONE DETECTED 06/29/2020 1749   LABBARB NONE DETECTED 06/29/2020 1749    Recent Labs  Lab 06/29/20 0904  ETH <10    I have personally reviewed the radiological images below and agree with the radiology interpretations.  CT Code Stroke CTA Head W/WO contrast  Result Date: 06/29/2020 CLINICAL DATA:  Hyperdense vessel and stroke-like symptoms EXAM: CT ANGIOGRAPHY HEAD AND NECK CT PERFUSION BRAIN TECHNIQUE: Multidetector CT imaging of the head and neck was performed using the standard protocol during bolus administration of intravenous contrast. Multiplanar CT image reconstructions and MIPs were obtained to evaluate the vascular anatomy. Carotid stenosis measurements (when applicable) are obtained utilizing NASCET criteria, using the distal internal carotid diameter as the denominator. Multiphase CT imaging of the brain  was performed following IV bolus contrast injection. Subsequent parametric perfusion maps were calculated using RAPID software. CONTRAST:  Dose is not yet known COMPARISON:  None. FINDINGS: CTA NECK FINDINGS Aortic arch: Mild atheromatous changes. Three vessel branching. No acute finding. Right carotid system: Atherosclerotic plaque about the bifurcation with occluded ICA bulb. No flow seen within the ICA in the neck. Left carotid system: Atheromatous plaque at the bifurcation and bulb. No stenosis or ulceration. Negative for beading. Vertebral arteries: Low-density plaque at the left subclavian origin. No flow limiting subclavian stenosis. Plaque at the left vertebral origin without stenosis, beading, or dissection. Skeleton: Focal advanced C5-6  disc degeneration with ridging causing biforaminal impingement. Ordinary facet osteoarthritis. Other neck: No acute or aggressive finding. Right-sided chronic sinusitis with pattern middle meatus obstruction. Upper chest: Granulomatous nodal calcifications. There is also a calcified granuloma appearance in the left upper lobe. Review of the MIP images confirms the above findings CTA HEAD FINDINGS Anterior circulation: No flow seen in the right carotid or MCA branches. There is distal M2 M3 branch reconstitution. Calcified plaque along the left carotid siphon. No MCA branch occlusion, aneurysm, or beading. Posterior circulation: The vertebral and basilar arteries are smooth and widely patent. High-grade atheromatous narrowings of the bilateral PCA, P3 segment on the right and upper first order branch on the left. Venous sinuses: Patent as permitted by contrast timing Anatomic variants: None significant Review of the MIP images confirms the above findings CT Brain Perfusion Findings: ASPECTS: 9 CBF (<30%) Volume: 71mL Perfusion (Tmax>6.0s) volume: 128mL Mismatch Volume: 134mL Infarction Location:Right basal ganglia and lesser extensive lateral frontal cortex infarct. Critical Value/emergent results were called by telephone at the time of interpretation on 06/29/2020 at 9:37 am to provider ERIC [facility] , who verbally acknowledged these results. IMPRESSION: 1. Emergent large vessel occlusion with no flow seen in the right internal carotid or proximal MCA. There is a 36 cc core infarct and 142 cc of penumbra by CT perfusion. 2. Atherosclerosis without flow limiting stenosis of the other major vessels. 3. Bilateral high-grade atheromatous PCA narrowings, more proximal on the right. 4. 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 Electronically Signed   By: Monte Fantasia M.D.   On: 06/29/2020 09:53   CT HEAD WO CONTRAST  Result Date: 07/01/2020 CLINICAL DATA:  Follow-up intracranial  hemorrhage EXAM: CT HEAD WITHOUT CONTRAST TECHNIQUE: Contiguous axial images were obtained from the base of the skull through the vertex without intravenous contrast. COMPARISON:  CT from 2 days ago FINDINGS: Brain: More contracted and homogeneous hematoma in the right basal ganglia measuring up to 2.8 x 2 cm. Cytotoxic edema from infarct has become apparent in the right basal ganglia and adjacent frontal insular cortex. No interval hemorrhage, hydrocephalus, or midline shift. Vascular: The right ICA is hyperdense below the skull base, unchanged. Skull: No acute or aggressive finding Sinuses/Orbits: Chronic right maxillary, ethmoid, and frontal sinusitis. Generalized mucosal thickening seen elsewhere, progressed. IMPRESSION: 1. Interval contraction of the right basal ganglia hematoma. 2. Expected evolution of the right frontoinsular infarcts. 3. 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 4. No new abnormality. Electronically Signed   By: Monte Fantasia M.D.   On: 07/01/2020 07:16   CT HEAD WO CONTRAST  Result Date: 06/29/2020 CLINICAL DATA:  Follow-up stroke and subsequent intervention. EXAM: CT HEAD WITHOUT CONTRAST TECHNIQUE: Contiguous axial images were obtained from the base of the skull through the vertex without intravenous contrast. COMPARISON:  Earlier same day FINDINGS: Brain: 3.9 x  3.0 x 3.0 cm hyperdense region in the right posterior basal ganglia most consistent with a parenchymal hematoma, given small internal fluid levels. Mild mass effect and surrounding edema. No midline shift. Chronic ischemic changes in the right posterior frontal region as seen previously. Vascular: No other new vascular finding. Skull: Negative Sinuses/Orbits: Opacified right maxillary and ethmoid sinuses. Orbits negative. Other: None IMPRESSION: 1. 3.9 x 3.0 x 3.0 cm hyperdense region in the right posterior basal ganglia most consistent with parenchymal hematoma, given small internal fluid levels. Mild mass  effect and surrounding edema. No midline shift. 2. Chronic ischemic changes in the right posterior frontal region as seen previously. 3. Opacified right maxillary and ethmoid sinuses. Electronically Signed   By: Nelson Chimes M.D.   On: 06/29/2020 18:48   CT Code Stroke CTA Neck W/WO contrast  Result Date: 06/29/2020 CLINICAL DATA:  Hyperdense vessel and stroke-like symptoms EXAM: CT ANGIOGRAPHY HEAD AND NECK CT PERFUSION BRAIN TECHNIQUE: Multidetector CT imaging of the head and neck was performed using the standard protocol during bolus administration of intravenous contrast. Multiplanar CT image reconstructions and MIPs were obtained to evaluate the vascular anatomy. Carotid stenosis measurements (when applicable) are obtained utilizing NASCET criteria, using the distal internal carotid diameter as the denominator. Multiphase CT imaging of the brain was performed following IV bolus contrast injection. Subsequent parametric perfusion maps were calculated using RAPID software. CONTRAST:  Dose is not yet known COMPARISON:  None. FINDINGS: CTA NECK FINDINGS Aortic arch: Mild atheromatous changes. Three vessel branching. No acute finding. Right carotid system: Atherosclerotic plaque about the bifurcation with occluded ICA bulb. No flow seen within the ICA in the neck. Left carotid system: Atheromatous plaque at the bifurcation and bulb. No stenosis or ulceration. Negative for beading. Vertebral arteries: Low-density plaque at the left subclavian origin. No flow limiting subclavian stenosis. Plaque at the left vertebral origin without stenosis, beading, or dissection. Skeleton: Focal advanced C5-6 disc degeneration with ridging causing biforaminal impingement. Ordinary facet osteoarthritis. Other neck: No acute or aggressive finding. Right-sided chronic sinusitis with pattern middle meatus obstruction. Upper chest: Granulomatous nodal calcifications. There is also a calcified granuloma appearance in the left upper  lobe. Review of the MIP images confirms the above findings CTA HEAD FINDINGS Anterior circulation: No flow seen in the right carotid or MCA branches. There is distal M2 M3 branch reconstitution. Calcified plaque along the left carotid siphon. No MCA branch occlusion, aneurysm, or beading. Posterior circulation: The vertebral and basilar arteries are smooth and widely patent. High-grade atheromatous narrowings of the bilateral PCA, P3 segment on the right and upper first order branch on the left. Venous sinuses: Patent as permitted by contrast timing Anatomic variants: None significant Review of the MIP images confirms the above findings CT Brain Perfusion Findings: ASPECTS: 9 CBF (<30%) Volume: 25mL Perfusion (Tmax>6.0s) volume: 128mL Mismatch Volume: 16mL Infarction Location:Right basal ganglia and lesser extensive lateral frontal cortex infarct. Critical Value/emergent results were called by telephone at the time of interpretation on 06/29/2020 at 9:37 am to provider ERIC [facility] , who verbally acknowledged these results. IMPRESSION: 1. Emergent large vessel occlusion with no flow seen in the right internal carotid or proximal MCA. There is a 36 cc core infarct and 142 cc of penumbra by CT perfusion. 2. Atherosclerosis without flow limiting stenosis of the other major vessels. 3. Bilateral high-grade atheromatous PCA narrowings, more proximal on the right. 4. 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 Electronically Signed   By: Neva Seat.D.  On: 06/29/2020 09:53   MR BRAIN WO CONTRAST  Result Date: 06/30/2020 CLINICAL DATA:  Stroke follow-up EXAM: MRI HEAD WITHOUT CONTRAST TECHNIQUE: Multiplanar, multiecho pulse sequences of the brain and surrounding structures were obtained without intravenous contrast. COMPARISON:  Head CT and CTA from yesterday FINDINGS: Brain: Acute infarction at the right stratum and patchy along the insula and lateral frontal cortex. The area of  infarction appears similar to the area of pre treatment core infarct. Hematoma centered at the right putamen with dimensions better assessed by prior CT, grossly similar at to 3.5 cm anterior to posterior. Correlating to fluid fluid level, there is a posterior dense clot appearance and more heterogeneous anterior component. On gradient imaging petechial hemorrhage is seen along the right MCA watershed. No hydrocephalus or shift. Remote high right frontal cortex infarct anteriorly. Vascular: No flow voids seen in the left ICA beginning in the upper cervical spine. There is normalization by the supraclinoid segment. Skull and upper cervical spine: No focal marrow lesion Sinuses/Orbits: Right middle meatus obstruction with frontal, ethmoid, and especially maxillary sinusitis on the right. IMPRESSION: 1. Acute infarct at the right basal ganglia with nonprogressive hemorrhage when correlated with prior CT. Less extensive patchy acute cortical infarct in the right MCA territory. The areas of acute infarction correlate well with pretreatment CTP. 2. 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 Electronically Signed   By: Monte Fantasia M.D.   On: 06/30/2020 04:22   CT Code Stroke Cerebral Perfusion with contrast  Result Date: 06/29/2020 CLINICAL DATA:  Hyperdense vessel and stroke-like symptoms EXAM: CT ANGIOGRAPHY HEAD AND NECK CT PERFUSION BRAIN TECHNIQUE: Multidetector CT imaging of the head and neck was performed using the standard protocol during bolus administration of intravenous contrast. Multiplanar CT image reconstructions and MIPs were obtained to evaluate the vascular anatomy. Carotid stenosis measurements (when applicable) are obtained utilizing NASCET criteria, using the distal internal carotid diameter as the denominator. Multiphase CT imaging of the brain was performed following IV bolus contrast injection. Subsequent parametric perfusion maps were calculated  using RAPID software. CONTRAST:  Dose is not yet known COMPARISON:  None. FINDINGS: CTA NECK FINDINGS Aortic arch: Mild atheromatous changes. Three vessel branching. No acute finding. Right carotid system: Atherosclerotic plaque about the bifurcation with occluded ICA bulb. No flow seen within the ICA in the neck. Left carotid system: Atheromatous plaque at the bifurcation and bulb. No stenosis or ulceration. Negative for beading. Vertebral arteries: Low-density plaque at the left subclavian origin. No flow limiting subclavian stenosis. Plaque at the left vertebral origin without stenosis, beading, or dissection. Skeleton: Focal advanced C5-6 disc degeneration with ridging causing biforaminal impingement. Ordinary facet osteoarthritis. Other neck: No acute or aggressive finding. Right-sided chronic sinusitis with pattern middle meatus obstruction. Upper chest: Granulomatous nodal calcifications. There is also a calcified granuloma appearance in the left upper lobe. Review of the MIP images confirms the above findings CTA HEAD FINDINGS Anterior circulation: No flow seen in the right carotid or MCA branches. There is distal M2 M3 branch reconstitution. Calcified plaque along the left carotid siphon. No MCA branch occlusion, aneurysm, or beading. Posterior circulation: The vertebral and basilar arteries are smooth and widely patent. High-grade atheromatous narrowings of the bilateral PCA, P3 segment on the right and upper first order branch on the left. Venous sinuses: Patent as permitted by contrast timing Anatomic variants: None significant Review of the MIP images confirms the above findings CT Brain Perfusion Findings: ASPECTS: 9 CBF (<30%)  Volume: 34mL Perfusion (Tmax>6.0s) volume: 163mL Mismatch Volume: 131mL Infarction Location:Right basal ganglia and lesser extensive lateral frontal cortex infarct. Critical Value/emergent results were called by telephone at the time of interpretation on 06/29/2020 at 9:37 am to  provider ERIC [facility] , who verbally acknowledged these results. IMPRESSION: 1. Emergent large vessel occlusion with no flow seen in the right internal carotid or proximal MCA. There is a 36 cc core infarct and 142 cc of penumbra by CT perfusion. 2. Atherosclerosis without flow limiting stenosis of the other major vessels. 3. Bilateral high-grade atheromatous PCA narrowings, more proximal on the right. 4. 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 Electronically Signed   By: Monte Fantasia M.D.   On: 06/29/2020 09:53   Portable Chest xray  Result Date: 06/30/2020 CLINICAL DATA:  Respiratory failure.  Stroke. EXAM: PORTABLE CHEST 1 VIEW COMPARISON:  06/29/2020 FINDINGS: 0538 hours. Endotracheal tube tip is 7.4 cm above the base of the carina. The lungs are clear without focal pneumonia, edema, pneumothorax or pleural effusion. The cardiopericardial silhouette is within normal limits for size. The visualized bony structures of the thorax show no acute abnormality. Telemetry leads overlie the chest. IMPRESSION: No acute cardiopulmonary findings. Electronically Signed   By: Misty Stanley M.D.   On: 06/30/2020 07:30   DG CHEST PORT 1 VIEW  Result Date: 06/29/2020 CLINICAL DATA:  Code stroke with coiling intubated EXAM: PORTABLE CHEST 1 VIEW COMPARISON:  None. FINDINGS: Endotracheal tube tip is about 4.2 cm superior to the carina. Esophageal tube tip is below the diaphragm but incompletely visualized. No focal opacity or pleural effusion. Normal cardiac size. No pneumothorax. IMPRESSION: Endotracheal tube tip about 4.2 cm superior to the carina. Lungs grossly clear Electronically Signed   By: Donavan Foil M.D.   On: 06/29/2020 16:00   ECHOCARDIOGRAM COMPLETE  Result Date: 06/30/2020    ECHOCARDIOGRAM REPORT   Patient Name:   JACQUELYN ANTONY Date of Exam: 06/30/2020 Medical Rec #:  [id number]     Height:       74.0 in Accession #:    [id number]    Weight:       187.6 lb Date of  Birth:  Feb 11, 1951     BSA:          2.115 m Patient Age:    24 years      BP:           162/65 mmHg Patient Gender: M             HR:           52 bpm. Exam Location:  Inpatient Procedure: 2D Echo, Cardiac Doppler, Color Doppler and Intracardiac            Opacification Agent Indications:    CVA  History:        Patient has no prior history of Echocardiogram examinations.                 Stroke and COPD, Signs/Symptoms:Resp. failure; Risk                 Factors:Hypertension.  Sonographer:    Dustin Flock Referring Phys: [id number] ERIC LINDZEN  Sonographer Comments: Technically difficult study due to poor echo windows and echo performed with patient supine and on artificial respirator. Image acquisition challenging due to COPD and Image acquisition challenging due to respiratory motion. IMPRESSIONS  1. Left ventricular ejection fraction, by estimation, is 50 to 55%. The left ventricle has low normal function.  The left ventricle has no regional wall motion abnormalities. Left ventricular diastolic parameters are consistent with Grade I diastolic dysfunction (impaired relaxation).  2. Right ventricular systolic function is normal. The right ventricular size is normal. There is mildly elevated pulmonary artery systolic pressure.  3. Left atrial size was mildly dilated.  4. The mitral valve is normal in structure. No evidence of mitral valve regurgitation. No evidence of mitral stenosis.  5. The aortic valve is normal in structure. Aortic valve regurgitation is not visualized. No aortic stenosis is present.  6. The inferior vena cava is dilated in size with <50% respiratory variability, suggesting right atrial pressure of 15 mmHg. FINDINGS  Left Ventricle: Left ventricular ejection fraction, by estimation, is 50 to 55%. The left ventricle has low normal function. The left ventricle has no regional wall motion abnormalities. Definity contrast agent was given IV to delineate the left ventricular endocardial borders. The  left ventricular internal cavity size was normal in size. There is no left ventricular hypertrophy. Left ventricular diastolic parameters are consistent with Grade I diastolic dysfunction (impaired relaxation). Indeterminate filling pressures. Right Ventricle: The right ventricular size is normal. No increase in right ventricular wall thickness. Right ventricular systolic function is normal. There is mildly elevated pulmonary artery systolic pressure. The tricuspid regurgitant velocity is 2.43  m/s, and with an assumed right atrial pressure of 15 mmHg, the estimated right ventricular systolic pressure is 61.6 mmHg. Left Atrium: Left atrial size was mildly dilated. Right Atrium: Right atrial size was normal in size. Pericardium: There is no evidence of pericardial effusion. Mitral Valve: The mitral valve is normal in structure. No evidence of mitral valve regurgitation. No evidence of mitral valve stenosis. Tricuspid Valve: The tricuspid valve is normal in structure. Tricuspid valve regurgitation is trivial. No evidence of tricuspid stenosis. Aortic Valve: The aortic valve is normal in structure. Aortic valve regurgitation is not visualized. No aortic stenosis is present. Pulmonic Valve: The pulmonic valve was normal in structure. Pulmonic valve regurgitation is not visualized. No evidence of pulmonic stenosis. Aorta: The aortic root is normal in size and structure. Venous: The inferior vena cava is dilated in size with less than 50% respiratory variability, suggesting right atrial pressure of 15 mmHg. IAS/Shunts: No atrial level shunt detected by color flow Doppler.  LEFT VENTRICLE PLAX 2D LVIDd:         5.30 cm  Diastology LVIDs:         3.80 cm  LV e' medial:    6.85 cm/s LV PW:         1.30 cm  LV E/e' medial:  10.2 LV IVS:        1.00 cm  LV e' lateral:   12.10 cm/s LVOT diam:     2.10 cm  LV E/e' lateral: 5.8 LV SV:         106 LV SV Index:   50 LVOT Area:     3.46 cm  RIGHT VENTRICLE RV Basal diam:  3.80 cm  RV S prime:     7.07 cm/s TAPSE (M-mode): 3.3 cm LEFT ATRIUM           Index       RIGHT ATRIUM           Index LA diam:      4.30 cm 2.03 cm/m  RA Area:     21.00 cm LA Vol (A4C): 45.5 ml 21.51 ml/m RA Volume:   67.30 ml  31.82 ml/m  AORTIC VALVE LVOT  Vmax:   150.00 cm/s LVOT Vmean:  90.400 cm/s LVOT VTI:    0.305 m  AORTA Ao Root diam: 3.50 cm MITRAL VALVE               TRICUSPID VALVE MV Area (PHT): 2.80 cm    TR Peak grad:   23.6 mmHg MV Decel Time: 271 msec    TR Vmax:        243.00 cm/s MV E velocity: 69.80 cm/s MV A velocity: 68.60 cm/s  SHUNTS MV E/A ratio:  1.02        Systemic VTI:  0.30 m                            Systemic Diam: 2.10 cm Skeet Latch MD Electronically signed by Skeet Latch MD Signature Date/Time: 06/30/2020/11:25:18 AM    Final    CT HEAD CODE STROKE WO CONTRAST  Result Date: 06/29/2020 CLINICAL DATA:  Code stroke.  Slurred speech and left facial droop EXAM: CT HEAD WITHOUT CONTRAST TECHNIQUE: Contiguous axial images were obtained from the base of the skull through the vertex without intravenous contrast. COMPARISON:  None. FINDINGS: Brain: Small remote appearing cortically based infarcts in the superior right frontal lobe. No hemorrhage, hydrocephalus, or masslike finding. Vascular: Hyperdense distal right ICA to MCA branches. Atherosclerotic calcification. Skull: Negative Sinuses/Orbits: Right maxillary, ethmoid, and frontal sinus opacification with sclerotic wall thickening at the maxillary sinus. Other: Critical Value/emergent results were called by telephone at the time of interpretation on 06/29/2020 at 9:23 am to provider Lindzen , who verbally acknowledged these results. ASPECTS [facility] Stroke Program Early CT Score) - Ganglionic level infarction (caudate, lentiform nuclei, internal capsule, insula, M1-M3 cortex): Posterior putamen appears blunted compared to the left. Equivocal for small insular cortex infarct. - Supraganglionic infarction (M4-M6 cortex): 3, when  accounting for chronic infarct Total score (0-10 with 10 being normal): 9, when accounting for chronic infarct IMPRESSION: 1. Hyperdense distal right ICA and proximal MCA. 2. Blunted appearance of the posterior right putamen. Chronic right high frontal cortex infarcts. ASPECTS is 9 when excluding the chronic changes. Electronically Signed   By: Monte Fantasia M.D.   On: 06/29/2020 09:25   VAS US CAROTID  Result Date: 06/30/2020 Carotid Arterial Duplex Study Indications:   CVA and Right stent. Other Factors: Rt ica stent- 06/29/20. Performing Technologist: [name] RVS  Examination Guidelines: A complete evaluation includes B-mode imaging, spectral Doppler, color Doppler, and power Doppler as needed of all accessible portions of each vessel. Bilateral testing is considered an integral part of a complete examination. Limited examinations for reoccurring indications may be performed as noted.  Right Carotid Findings: +----------+--------+--------+--------+------------------+--------+           PSV cm/sEDV cm/sStenosisPlaque DescriptionComments +----------+--------+--------+--------+------------------+--------+ CCA Prox  101                     heterogenous               +----------+--------+--------+--------+------------------+--------+ CCA Distal57                      heterogenous               +----------+--------+--------+--------+------------------+--------+ ECA       103     6                                          +----------+--------+--------+--------+------------------+--------+ +----------+--------+-------+--------+-------------------+  PSV cm/sEDV cmsDescribeArm Pressure (mmHG) +----------+--------+-------+--------+-------------------+ Subclavian160                                        +----------+--------+-------+--------+-------------------+ +---------+--------+--+--------+--+---------+ VertebralPSV cm/s63EDV cm/s12Antegrade  +---------+--------+--+--------+--+---------+  Right Stent(s): +---------------+--+-+--------+++ Prox to Stent  274         +---------------+--+-+--------+++ Proximal Stent 49          +---------------+--+-+--------+++ Mid Stent         occluded +---------------+--+-+--------+++ Distal Stent      occluded +---------------+--+-+--------+++ Distal to Stent   occluded +---------------+--+-+--------+++   Left Carotid Findings: +----------+--------+--------+--------+------------------+--------+           PSV cm/sEDV cm/sStenosisPlaque DescriptionComments +----------+--------+--------+--------+------------------+--------+ CCA Prox  133     18              heterogenous               +----------+--------+--------+--------+------------------+--------+ CCA Distal95      17              heterogenous               +----------+--------+--------+--------+------------------+--------+ ICA Prox  97      22      1-39%   heterogenous               +----------+--------+--------+--------+------------------+--------+ ICA Distal108     36                                         +----------+--------+--------+--------+------------------+--------+ ECA       120     10                                         +----------+--------+--------+--------+------------------+--------+ +----------+--------+--------+--------+-------------------+           PSV cm/sEDV cm/sDescribeArm Pressure (mmHG) +----------+--------+--------+--------+-------------------+ [id number]                                         +----------+--------+--------+--------+-------------------+ +---------+--------+--+--------+--+---------+ VertebralPSV cm/s48EDV cm/s13Antegrade +---------+--------+--+--------+--+---------+   Summary: Right Carotid: ICA stent appears occluded. Left Carotid: Velocities in the left ICA are consistent with a 1-39% stenosis. Vertebrals: Bilateral vertebral  arteries demonstrate antegrade flow. *See table(s) above for measurements and observations.     Preliminary    VAS [place] LOWER EXTREMITY VENOUS (DVT)  Result Date: 06/30/2020  Lower Venous DVTStudy Indications: Stroke.  Comparison Study: no prior Performing Technologist: [name] RVS  Examination Guidelines: A complete evaluation includes B-mode imaging, spectral Doppler, color Doppler, and power Doppler as needed of all accessible portions of each vessel. Bilateral testing is considered an integral part of a complete examination. Limited examinations for reoccurring indications may be performed as noted. The reflux portion of the exam is performed with the patient in reverse Trendelenburg.  +---------+---------------+---------+-----------+----------+--------------+ RIGHT    CompressibilityPhasicitySpontaneityPropertiesThrombus Aging +---------+---------------+---------+-----------+----------+--------------+ CFV      Full           Yes      Yes                                 +---------+---------------+---------+-----------+----------+--------------+  SFJ      Full                                                        +---------+---------------+---------+-----------+----------+--------------+ FV Prox  Full                                                        +---------+---------------+---------+-----------+----------+--------------+ FV Mid   Full                                                        +---------+---------------+---------+-----------+----------+--------------+ FV DistalFull                                                        +---------+---------------+---------+-----------+----------+--------------+ PFV      Full                                                        +---------+---------------+---------+-----------+----------+--------------+ POP      Full           Yes      Yes                                  +---------+---------------+---------+-----------+----------+--------------+ PTV      Full                                                        +---------+---------------+---------+-----------+----------+--------------+ PERO     Full                                                        +---------+---------------+---------+-----------+----------+--------------+   +---------+---------------+---------+-----------+----------+--------------+ LEFT     CompressibilityPhasicitySpontaneityPropertiesThrombus Aging +---------+---------------+---------+-----------+----------+--------------+ CFV      Full           Yes      Yes                                 +---------+---------------+---------+-----------+----------+--------------+ SFJ      Full                                                        +---------+---------------+---------+-----------+----------+--------------+  FV Prox  Full                                                        +---------+---------------+---------+-----------+----------+--------------+ FV Mid   Full                                                        +---------+---------------+---------+-----------+----------+--------------+ FV DistalFull                                                        +---------+---------------+---------+-----------+----------+--------------+ PFV      Full                                                        +---------+---------------+---------+-----------+----------+--------------+ POP      Full           Yes      Yes                                 +---------+---------------+---------+-----------+----------+--------------+ PTV      Full                                                        +---------+---------------+---------+-----------+----------+--------------+     Summary: BILATERAL: - No evidence of deep vein thrombosis seen in the lower extremities, bilaterally. - No evidence of  superficial venous thrombosis in the lower extremities, bilaterally. -   *See table(s) above for measurements and observations.    Preliminary     PHYSICAL EXAM   Temp:  [98.5 F (36.9 C)-100.6 F (38.1 C)] 99.7 F (37.6 C) (10/06 0400) Pulse Rate:  [41-90] 61 (10/06 1015) Resp:  [9-33] 23 (10/06 1015) BP: (98-179)/(38-92) 155/71 (10/06 1015) SpO2:  [96 %-100 %] 100 % (10/06 1015)  General - Well nourished, well developed, not in acute distress.  Ophthalmologic - fundi not visualized due to noncooperation.  Cardiovascular - Regular rate and rhythm.  Neuro - awake alert, eyes open, orientated x 4, no aphasia, able to name and repeat but mild dysarthria, able to follow simple commands. Eyes in right gaze perference position but able to cross midline and left gaze incomplete, left hemianopia and visual neglect, however, able to track bilaterally, PERRL. Left facial droop. RUE and RLE at least 4/5. LUE proximal 2/5 and distal 0/5, LLE 3-/5 proximal and distally. DTR 1+ and no babinski. Sensation symmetrical per pt, however, left sensory neglect. Right FTN intact on the right and gait not tested.   ASSESSMENT/PLAN [name] is a 69 y.o. male with history of gout admitted for left-sided weakness, right  gaze, left facial droop with fall. No tPA given due to outside window.    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 Evolution R frontoinsular infarcts. R ICA occluded.  Carotid Doppler  confirmed right ICA re-occluded  2D Echo EF 50 to 55%  LE venous Doppler no DVT  LDL 93  HgbA1c 5.6  Heparin IV for VTE prophylaxis    No antithrombotic prior to admission, now on aspirin 81 mg daily and Brilinta (ticagrelor) 90 mg bid.    Ongoing aggressive stroke risk factor management  Therapy recommendations:  CIR  Disposition:  pending  Carotid occlusion  CTA head and neck right ICA and MCA occlusion  S/p carotid stenting due to severe athero but repeat carotid Doppler confirmed right ICA reoccluded  On ASA and brilinta  Hypotension and bradycardia . Likely related to carotid stenting  BP goal 120-160  On Levophed now  Cardiology consulted  Long term BP goal 130-150 given occluded right ICA  Hyperlipidemia  Home meds: None  LDL 93, goal < 70  Add lipitor 40  Continue statin at discharge  Dysphagia  Pass swallow  On dys 1 and thin liquid  Speech on board  Other Stroke Risk Factors    Other Active Problems  Gout  Hospital day # 2  This patient is critically ill due to right MCA infarct, right ICA occlusion s/p stenting, stent reoccluded, dysphagia and at significant risk of neurological worsening, death form recurrent stroke, hemorrhagic conversion, seizure, aspiration. This patient's care requires constant monitoring of vital signs, hemodynamics, respiratory and cardiac monitoring, review of multiple databases, neurological assessment, discussion with family, other specialists and medical decision making of high complexity. I spent 35 minutes of neurocritical care time in the care of this patient. I had long discussion with wife at bedside, updated pt current condition, treatment plan and potential prognosis, and answered all the questions. She expressed understanding and appreciation.    Tyler Hawking, MD PhD Stroke Neurology 07/01/2020 11:53 PM     To contact Stroke Continuity provider, please refer to [email or web address] After hours, contact  General Neurology

## 2020-07-01 NOTE — Progress Notes (Signed)
Referring Physician(s): Dr. Erlinda Hong  Supervising Physician: Luanne Bras  Patient Status:  [facility] - In-pt  Chief Complaint: R MCA CVA  Subjective: Patient extubated, sitting up on side of bed, assisted by PT.  Left-sided neglect, however does acknowledge with prompting.  Significant left-sided weakness.  Cognition and speech appear intact, however has not yet passed swallow evaluation.  Still with HR control issues.  Cardiology consulted by CCM.   Allergies: Patient has no known allergies.  Medications: Prior to Admission medications   Medication Sig Start Date End Date Taking? Authorizing Provider  acetaminophen (TYLENOL) 325 MG tablet Take 650 mg by mouth every 6 (six) hours as needed for mild pain or headache.   Yes [provider]  aspirin EC 81 MG tablet Take 81 mg by mouth daily as needed for mild pain. Swallow whole.   Yes [provider]     Vital Signs: BP (!) 179/76   Pulse (!) 56   Temp 99.7 F (37.6 C) (Axillary)   Resp 18   Ht 6\' 2"  (1.88 m)   Wt 187 lb 9.8 oz (85.1 kg)   SpO2 99%   BMI 24.09 kg/m   Physical Exam  NAD, alert, sitting up assisted in bed.  Neuro: alert, oriented- "I had a stroke yesterday."  EOMs intact, eye does cross midline with left-sided gaze. Facial droop.  Tongue deviation to the left. RUE weakness with strength 0/5.  Trace movement in LLE, 1/5.   Imaging: CT Code Stroke CTA Head W/WO contrast  Result Date: 06/29/2020 CLINICAL DATA:  Hyperdense vessel and stroke-like symptoms EXAM: CT ANGIOGRAPHY HEAD AND NECK CT PERFUSION BRAIN TECHNIQUE: Multidetector CT imaging of the head and neck was performed using the standard protocol during bolus administration of intravenous contrast. Multiplanar CT image reconstructions and MIPs were obtained to evaluate the vascular anatomy. Carotid stenosis measurements (when applicable) are obtained utilizing NASCET criteria, using the distal internal carotid diameter as the  denominator. Multiphase CT imaging of the brain was performed following IV bolus contrast injection. Subsequent parametric perfusion maps were calculated using RAPID software. CONTRAST:  Dose is not yet known COMPARISON:  None. FINDINGS: CTA NECK FINDINGS Aortic arch: Mild atheromatous changes. Three vessel branching. No acute finding. Right carotid system: Atherosclerotic plaque about the bifurcation with occluded ICA bulb. No flow seen within the ICA in the neck. Left carotid system: Atheromatous plaque at the bifurcation and bulb. No stenosis or ulceration. Negative for beading. Vertebral arteries: Low-density plaque at the left subclavian origin. No flow limiting subclavian stenosis. Plaque at the left vertebral origin without stenosis, beading, or dissection. Skeleton: Focal advanced C5-6 disc degeneration with ridging causing biforaminal impingement. Ordinary facet osteoarthritis. Other neck: No acute or aggressive finding. Right-sided chronic sinusitis with pattern middle meatus obstruction. Upper chest: Granulomatous nodal calcifications. There is also a calcified granuloma appearance in the left upper lobe. Review of the MIP images confirms the above findings CTA HEAD FINDINGS Anterior circulation: No flow seen in the right carotid or MCA branches. There is distal M2 M3 branch reconstitution. Calcified plaque along the left carotid siphon. No MCA branch occlusion, aneurysm, or beading. Posterior circulation: The vertebral and basilar arteries are smooth and widely patent. High-grade atheromatous narrowings of the bilateral PCA, P3 segment on the right and upper first order branch on the left. Venous sinuses: Patent as permitted by contrast timing Anatomic variants: None significant Review of the MIP images confirms the above findings CT Brain Perfusion Findings: ASPECTS: 9 CBF (<30%) Volume:  42mL Perfusion (Tmax>6.0s) volume: 189mL Mismatch Volume: 171mL Infarction Location:Right basal ganglia and lesser  extensive lateral frontal cortex infarct. Critical Value/emergent results were called by telephone at the time of interpretation on 06/29/2020 at 9:37 am to provider ERIC [facility] , who verbally acknowledged these results. IMPRESSION: 1. Emergent large vessel occlusion with no flow seen in the right internal carotid or proximal MCA. There is a 36 cc core infarct and 142 cc of penumbra by CT perfusion. 2. Atherosclerosis without flow limiting stenosis of the other major vessels. 3. Bilateral high-grade atheromatous PCA narrowings, more proximal on the right. 4. 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 Electronically Signed   By: Monte Fantasia M.D.   On: 06/29/2020 09:53   CT HEAD WO CONTRAST  Result Date: 07/01/2020 CLINICAL DATA:  Follow-up intracranial hemorrhage EXAM: CT HEAD WITHOUT CONTRAST TECHNIQUE: Contiguous axial images were obtained from the base of the skull through the vertex without intravenous contrast. COMPARISON:  CT from 2 days ago FINDINGS: Brain: More contracted and homogeneous hematoma in the right basal ganglia measuring up to 2.8 x 2 cm. Cytotoxic edema from infarct has become apparent in the right basal ganglia and adjacent frontal insular cortex. No interval hemorrhage, hydrocephalus, or midline shift. Vascular: The right ICA is hyperdense below the skull base, unchanged. Skull: No acute or aggressive finding Sinuses/Orbits: Chronic right maxillary, ethmoid, and frontal sinusitis. Generalized mucosal thickening seen elsewhere, progressed. IMPRESSION: 1. Interval contraction of the right basal ganglia hematoma. 2. Expected evolution of the right frontoinsular infarcts. 3. 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 4. No new abnormality. Electronically Signed   By: Monte Fantasia M.D.   On: 07/01/2020 07:16   CT HEAD WO CONTRAST  Result Date: 06/29/2020 CLINICAL DATA:  Follow-up stroke and subsequent intervention. EXAM: CT HEAD WITHOUT  CONTRAST TECHNIQUE: Contiguous axial images were obtained from the base of the skull through the vertex without intravenous contrast. COMPARISON:  Earlier same day FINDINGS: Brain: 3.9 x 3.0 x 3.0 cm hyperdense region in the right posterior basal ganglia most consistent with a parenchymal hematoma, given small internal fluid levels. Mild mass effect and surrounding edema. No midline shift. Chronic ischemic changes in the right posterior frontal region as seen previously. Vascular: No other new vascular finding. Skull: Negative Sinuses/Orbits: Opacified right maxillary and ethmoid sinuses. Orbits negative. Other: None IMPRESSION: 1. 3.9 x 3.0 x 3.0 cm hyperdense region in the right posterior basal ganglia most consistent with parenchymal hematoma, given small internal fluid levels. Mild mass effect and surrounding edema. No midline shift. 2. Chronic ischemic changes in the right posterior frontal region as seen previously. 3. Opacified right maxillary and ethmoid sinuses. Electronically Signed   By: Nelson Chimes M.D.   On: 06/29/2020 18:48   CT Code Stroke CTA Neck W/WO contrast  Result Date: 06/29/2020 CLINICAL DATA:  Hyperdense vessel and stroke-like symptoms EXAM: CT ANGIOGRAPHY HEAD AND NECK CT PERFUSION BRAIN TECHNIQUE: Multidetector CT imaging of the head and neck was performed using the standard protocol during bolus administration of intravenous contrast. Multiplanar CT image reconstructions and MIPs were obtained to evaluate the vascular anatomy. Carotid stenosis measurements (when applicable) are obtained utilizing NASCET criteria, using the distal internal carotid diameter as the denominator. Multiphase CT imaging of the brain was performed following IV bolus contrast injection. Subsequent parametric perfusion maps were calculated using RAPID software. CONTRAST:  Dose is not yet known COMPARISON:  None. FINDINGS: CTA NECK FINDINGS Aortic arch: Mild atheromatous changes. Three  vessel branching. No acute  finding. Right carotid system: Atherosclerotic plaque about the bifurcation with occluded ICA bulb. No flow seen within the ICA in the neck. Left carotid system: Atheromatous plaque at the bifurcation and bulb. No stenosis or ulceration. Negative for beading. Vertebral arteries: Low-density plaque at the left subclavian origin. No flow limiting subclavian stenosis. Plaque at the left vertebral origin without stenosis, beading, or dissection. Skeleton: Focal advanced C5-6 disc degeneration with ridging causing biforaminal impingement. Ordinary facet osteoarthritis. Other neck: No acute or aggressive finding. Right-sided chronic sinusitis with pattern middle meatus obstruction. Upper chest: Granulomatous nodal calcifications. There is also a calcified granuloma appearance in the left upper lobe. Review of the MIP images confirms the above findings CTA HEAD FINDINGS Anterior circulation: No flow seen in the right carotid or MCA branches. There is distal M2 M3 branch reconstitution. Calcified plaque along the left carotid siphon. No MCA branch occlusion, aneurysm, or beading. Posterior circulation: The vertebral and basilar arteries are smooth and widely patent. High-grade atheromatous narrowings of the bilateral PCA, P3 segment on the right and upper first order branch on the left. Venous sinuses: Patent as permitted by contrast timing Anatomic variants: None significant Review of the MIP images confirms the above findings CT Brain Perfusion Findings: ASPECTS: 9 CBF (<30%) Volume: 39mL Perfusion (Tmax>6.0s) volume: 155mL Mismatch Volume: 157mL Infarction Location:Right basal ganglia and lesser extensive lateral frontal cortex infarct. Critical Value/emergent results were called by telephone at the time of interpretation on 06/29/2020 at 9:37 am to provider ERIC [facility] , who verbally acknowledged these results. IMPRESSION: 1. Emergent large vessel occlusion with no flow seen in the right internal carotid or proximal  MCA. There is a 36 cc core infarct and 142 cc of penumbra by CT perfusion. 2. Atherosclerosis without flow limiting stenosis of the other major vessels. 3. Bilateral high-grade atheromatous PCA narrowings, more proximal on the right. 4. 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 Electronically Signed   By: Monte Fantasia M.D.   On: 06/29/2020 09:53   MR BRAIN WO CONTRAST  Result Date: 06/30/2020 CLINICAL DATA:  Stroke follow-up EXAM: MRI HEAD WITHOUT CONTRAST TECHNIQUE: Multiplanar, multiecho pulse sequences of the brain and surrounding structures were obtained without intravenous contrast. COMPARISON:  Head CT and CTA from yesterday FINDINGS: Brain: Acute infarction at the right stratum and patchy along the insula and lateral frontal cortex. The area of infarction appears similar to the area of pre treatment core infarct. Hematoma centered at the right putamen with dimensions better assessed by prior CT, grossly similar at to 3.5 cm anterior to posterior. Correlating to fluid fluid level, there is a posterior dense clot appearance and more heterogeneous anterior component. On gradient imaging petechial hemorrhage is seen along the right MCA watershed. No hydrocephalus or shift. Remote high right frontal cortex infarct anteriorly. Vascular: No flow voids seen in the left ICA beginning in the upper cervical spine. There is normalization by the supraclinoid segment. Skull and upper cervical spine: No focal marrow lesion Sinuses/Orbits: Right middle meatus obstruction with frontal, ethmoid, and especially maxillary sinusitis on the right. IMPRESSION: 1. Acute infarct at the right basal ganglia with nonprogressive hemorrhage when correlated with prior CT. Less extensive patchy acute cortical infarct in the right MCA territory. The areas of acute infarction correlate well with pretreatment CTP. 2. 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  Electronically Signed   By: Monte Fantasia M.D.   On: 06/30/2020 04:22  CT Code Stroke Cerebral Perfusion with contrast  Result Date: 06/29/2020 CLINICAL DATA:  Hyperdense vessel and stroke-like symptoms EXAM: CT ANGIOGRAPHY HEAD AND NECK CT PERFUSION BRAIN TECHNIQUE: Multidetector CT imaging of the head and neck was performed using the standard protocol during bolus administration of intravenous contrast. Multiplanar CT image reconstructions and MIPs were obtained to evaluate the vascular anatomy. Carotid stenosis measurements (when applicable) are obtained utilizing NASCET criteria, using the distal internal carotid diameter as the denominator. Multiphase CT imaging of the brain was performed following IV bolus contrast injection. Subsequent parametric perfusion maps were calculated using RAPID software. CONTRAST:  Dose is not yet known COMPARISON:  None. FINDINGS: CTA NECK FINDINGS Aortic arch: Mild atheromatous changes. Three vessel branching. No acute finding. Right carotid system: Atherosclerotic plaque about the bifurcation with occluded ICA bulb. No flow seen within the ICA in the neck. Left carotid system: Atheromatous plaque at the bifurcation and bulb. No stenosis or ulceration. Negative for beading. Vertebral arteries: Low-density plaque at the left subclavian origin. No flow limiting subclavian stenosis. Plaque at the left vertebral origin without stenosis, beading, or dissection. Skeleton: Focal advanced C5-6 disc degeneration with ridging causing biforaminal impingement. Ordinary facet osteoarthritis. Other neck: No acute or aggressive finding. Right-sided chronic sinusitis with pattern middle meatus obstruction. Upper chest: Granulomatous nodal calcifications. There is also a calcified granuloma appearance in the left upper lobe. Review of the MIP images confirms the above findings CTA HEAD FINDINGS Anterior circulation: No flow seen in the right carotid or MCA branches. There is distal M2 M3  branch reconstitution. Calcified plaque along the left carotid siphon. No MCA branch occlusion, aneurysm, or beading. Posterior circulation: The vertebral and basilar arteries are smooth and widely patent. High-grade atheromatous narrowings of the bilateral PCA, P3 segment on the right and upper first order branch on the left. Venous sinuses: Patent as permitted by contrast timing Anatomic variants: None significant Review of the MIP images confirms the above findings CT Brain Perfusion Findings: ASPECTS: 9 CBF (<30%) Volume: 80mL Perfusion (Tmax>6.0s) volume: 140mL Mismatch Volume: 166mL Infarction Location:Right basal ganglia and lesser extensive lateral frontal cortex infarct. Critical Value/emergent results were called by telephone at the time of interpretation on 06/29/2020 at 9:37 am to provider ERIC [facility] , who verbally acknowledged these results. IMPRESSION: 1. Emergent large vessel occlusion with no flow seen in the right internal carotid or proximal MCA. There is a 36 cc core infarct and 142 cc of penumbra by CT perfusion. 2. Atherosclerosis without flow limiting stenosis of the other major vessels. 3. Bilateral high-grade atheromatous PCA narrowings, more proximal on the right. 4. 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 Electronically Signed   By: Monte Fantasia M.D.   On: 06/29/2020 09:53   Portable Chest xray  Result Date: 06/30/2020 CLINICAL DATA:  Respiratory failure.  Stroke. EXAM: PORTABLE CHEST 1 VIEW COMPARISON:  06/29/2020 FINDINGS: 0538 hours. Endotracheal tube tip is 7.4 cm above the base of the carina. The lungs are clear without focal pneumonia, edema, pneumothorax or pleural effusion. The cardiopericardial silhouette is within normal limits for size. The visualized bony structures of the thorax show no acute abnormality. Telemetry leads overlie the chest. IMPRESSION: No acute cardiopulmonary findings. Electronically Signed   By: Misty Stanley M.D.   On:  06/30/2020 07:30   DG CHEST PORT 1 VIEW  Result Date: 06/29/2020 CLINICAL DATA:  Code stroke with coiling intubated EXAM: PORTABLE CHEST 1 VIEW COMPARISON:  None. FINDINGS: Endotracheal tube tip is about 4.2 cm  superior to the carina. Esophageal tube tip is below the diaphragm but incompletely visualized. No focal opacity or pleural effusion. Normal cardiac size. No pneumothorax. IMPRESSION: Endotracheal tube tip about 4.2 cm superior to the carina. Lungs grossly clear Electronically Signed   By: Donavan Foil M.D.   On: 06/29/2020 16:00   ECHOCARDIOGRAM COMPLETE  Result Date: 06/30/2020    ECHOCARDIOGRAM REPORT   Patient Name:   Tyler Richards Date of Exam: 06/30/2020 Medical Rec #:  [id number]     Height:       74.0 in Accession #:    [id number]    Weight:       187.6 lb Date of Birth:  Apr 04, 1951     BSA:          2.115 m Patient Age:    69 years      BP:           162/65 mmHg Patient Gender: M             HR:           52 bpm. Exam Location:  Inpatient Procedure: 2D Echo, Cardiac Doppler, Color Doppler and Intracardiac            Opacification Agent Indications:    CVA  History:        Patient has no prior history of Echocardiogram examinations.                 Stroke and COPD, Signs/Symptoms:Resp. failure; Risk                 Factors:Hypertension.  Sonographer:    Dustin Flock Referring Phys: [id number] ERIC LINDZEN  Sonographer Comments: Technically difficult study due to poor echo windows and echo performed with patient supine and on artificial respirator. Image acquisition challenging due to COPD and Image acquisition challenging due to respiratory motion. IMPRESSIONS  1. Left ventricular ejection fraction, by estimation, is 50 to 55%. The left ventricle has low normal function. The left ventricle has no regional wall motion abnormalities. Left ventricular diastolic parameters are consistent with Grade I diastolic dysfunction (impaired relaxation).  2. Right ventricular systolic function is normal. The  right ventricular size is normal. There is mildly elevated pulmonary artery systolic pressure.  3. Left atrial size was mildly dilated.  4. The mitral valve is normal in structure. No evidence of mitral valve regurgitation. No evidence of mitral stenosis.  5. The aortic valve is normal in structure. Aortic valve regurgitation is not visualized. No aortic stenosis is present.  6. The inferior vena cava is dilated in size with <50% respiratory variability, suggesting right atrial pressure of 15 mmHg. FINDINGS  Left Ventricle: Left ventricular ejection fraction, by estimation, is 50 to 55%. The left ventricle has low normal function. The left ventricle has no regional wall motion abnormalities. Definity contrast agent was given IV to delineate the left ventricular endocardial borders. The left ventricular internal cavity size was normal in size. There is no left ventricular hypertrophy. Left ventricular diastolic parameters are consistent with Grade I diastolic dysfunction (impaired relaxation). Indeterminate filling pressures. Right Ventricle: The right ventricular size is normal. No increase in right ventricular wall thickness. Right ventricular systolic function is normal. There is mildly elevated pulmonary artery systolic pressure. The tricuspid regurgitant velocity is 2.43  m/s, and with an assumed right atrial pressure of 15 mmHg, the estimated right ventricular systolic pressure is 03.4 mmHg. Left Atrium: Left atrial size was mildly dilated. Right Atrium: Right atrial  size was normal in size. Pericardium: There is no evidence of pericardial effusion. Mitral Valve: The mitral valve is normal in structure. No evidence of mitral valve regurgitation. No evidence of mitral valve stenosis. Tricuspid Valve: The tricuspid valve is normal in structure. Tricuspid valve regurgitation is trivial. No evidence of tricuspid stenosis. Aortic Valve: The aortic valve is normal in structure. Aortic valve regurgitation is not  visualized. No aortic stenosis is present. Pulmonic Valve: The pulmonic valve was normal in structure. Pulmonic valve regurgitation is not visualized. No evidence of pulmonic stenosis. Aorta: The aortic root is normal in size and structure. Venous: The inferior vena cava is dilated in size with less than 50% respiratory variability, suggesting right atrial pressure of 15 mmHg. IAS/Shunts: No atrial level shunt detected by color flow Doppler.  LEFT VENTRICLE PLAX 2D LVIDd:         5.30 cm  Diastology LVIDs:         3.80 cm  LV e' medial:    6.85 cm/s LV PW:         1.30 cm  LV E/e' medial:  10.2 LV IVS:        1.00 cm  LV e' lateral:   12.10 cm/s LVOT diam:     2.10 cm  LV E/e' lateral: 5.8 LV SV:         106 LV SV Index:   50 LVOT Area:     3.46 cm  RIGHT VENTRICLE RV Basal diam:  3.80 cm RV S prime:     7.07 cm/s TAPSE (M-mode): 3.3 cm LEFT ATRIUM           Index       RIGHT ATRIUM           Index LA diam:      4.30 cm 2.03 cm/m  RA Area:     21.00 cm LA Vol (A4C): 45.5 ml 21.51 ml/m RA Volume:   67.30 ml  31.82 ml/m  AORTIC VALVE LVOT Vmax:   150.00 cm/s LVOT Vmean:  90.400 cm/s LVOT VTI:    0.305 m  AORTA Ao Root diam: 3.50 cm MITRAL VALVE               TRICUSPID VALVE MV Area (PHT): 2.80 cm    TR Peak grad:   23.6 mmHg MV Decel Time: 271 msec    TR Vmax:        243.00 cm/s MV E velocity: 69.80 cm/s MV A velocity: 68.60 cm/s  SHUNTS MV E/A ratio:  1.02        Systemic VTI:  0.30 m                            Systemic Diam: 2.10 cm Skeet Latch MD Electronically signed by Skeet Latch MD Signature Date/Time: 06/30/2020/11:25:18 AM    Final    CT HEAD CODE STROKE WO CONTRAST  Result Date: 06/29/2020 CLINICAL DATA:  Code stroke.  Slurred speech and left facial droop EXAM: CT HEAD WITHOUT CONTRAST TECHNIQUE: Contiguous axial images were obtained from the base of the skull through the vertex without intravenous contrast. COMPARISON:  None. FINDINGS: Brain: Small remote appearing cortically based  infarcts in the superior right frontal lobe. No hemorrhage, hydrocephalus, or masslike finding. Vascular: Hyperdense distal right ICA to MCA branches. Atherosclerotic calcification. Skull: Negative Sinuses/Orbits: Right maxillary, ethmoid, and frontal sinus opacification with sclerotic wall thickening at the maxillary sinus. Other: Critical Value/emergent results  were called by telephone at the time of interpretation on 06/29/2020 at 9:23 am to provider Cheral Marker , who verbally acknowledged these results. ASPECTS [facility] Stroke Program Early CT Score) - Ganglionic level infarction (caudate, lentiform nuclei, internal capsule, insula, M1-M3 cortex): Posterior putamen appears blunted compared to the left. Equivocal for small insular cortex infarct. - Supraganglionic infarction (M4-M6 cortex): 3, when accounting for chronic infarct Total score (0-10 with 10 being normal): 9, when accounting for chronic infarct IMPRESSION: 1. Hyperdense distal right ICA and proximal MCA. 2. Blunted appearance of the posterior right putamen. Chronic right high frontal cortex infarcts. ASPECTS is 9 when excluding the chronic changes. Electronically Signed   By: Monte Fantasia M.D.   On: 06/29/2020 09:25   VAS US CAROTID  Result Date: 06/30/2020 Carotid Arterial Duplex Study Indications:   CVA and Right stent. Other Factors: Rt ica stent- 06/29/20. Performing Technologist: [name] RVS  Examination Guidelines: A complete evaluation includes B-mode imaging, spectral Doppler, color Doppler, and power Doppler as needed of all accessible portions of each vessel. Bilateral testing is considered an integral part of a complete examination. Limited examinations for reoccurring indications may be performed as noted.  Right Carotid Findings: +----------+--------+--------+--------+------------------+--------+           PSV cm/sEDV cm/sStenosisPlaque DescriptionComments +----------+--------+--------+--------+------------------+--------+  CCA Prox  101                     heterogenous               +----------+--------+--------+--------+------------------+--------+ CCA Distal57                      heterogenous               +----------+--------+--------+--------+------------------+--------+ ECA       103     6                                          +----------+--------+--------+--------+------------------+--------+ +----------+--------+-------+--------+-------------------+           PSV cm/sEDV cmsDescribeArm Pressure (mmHG) +----------+--------+-------+--------+-------------------+ Subclavian160                                        +----------+--------+-------+--------+-------------------+ +---------+--------+--+--------+--+---------+ VertebralPSV cm/s63EDV cm/s12Antegrade +---------+--------+--+--------+--+---------+  Right Stent(s): +---------------+--+-+--------+++ Prox to Stent  274         +---------------+--+-+--------+++ Proximal Stent 49          +---------------+--+-+--------+++ Mid Stent         occluded +---------------+--+-+--------+++ Distal Stent      occluded +---------------+--+-+--------+++ Distal to Stent   occluded +---------------+--+-+--------+++   Left Carotid Findings: +----------+--------+--------+--------+------------------+--------+           PSV cm/sEDV cm/sStenosisPlaque DescriptionComments +----------+--------+--------+--------+------------------+--------+ CCA Prox  133     18              heterogenous               +----------+--------+--------+--------+------------------+--------+ CCA Distal95      17              heterogenous               +----------+--------+--------+--------+------------------+--------+ ICA Prox  97      22      1-39%   heterogenous               +----------+--------+--------+--------+------------------+--------+  ICA Distal108     36                                          +----------+--------+--------+--------+------------------+--------+ ECA       120     10                                         +----------+--------+--------+--------+------------------+--------+ +----------+--------+--------+--------+-------------------+           PSV cm/sEDV cm/sDescribeArm Pressure (mmHG) +----------+--------+--------+--------+-------------------+ [id number]                                         +----------+--------+--------+--------+-------------------+ +---------+--------+--+--------+--+---------+ VertebralPSV cm/s48EDV cm/s13Antegrade +---------+--------+--+--------+--+---------+   Summary: Right Carotid: ICA stent appears occluded. Left Carotid: Velocities in the left ICA are consistent with a 1-39% stenosis. Vertebrals: Bilateral vertebral arteries demonstrate antegrade flow. *See table(s) above for measurements and observations.     Preliminary    VAS [place] LOWER EXTREMITY VENOUS (DVT)  Result Date: 06/30/2020  Lower Venous DVTStudy Indications: Stroke.  Comparison Study: no prior Performing Technologist: [name] RVS  Examination Guidelines: A complete evaluation includes B-mode imaging, spectral Doppler, color Doppler, and power Doppler as needed of all accessible portions of each vessel. Bilateral testing is considered an integral part of a complete examination. Limited examinations for reoccurring indications may be performed as noted. The reflux portion of the exam is performed with the patient in reverse Trendelenburg.  +---------+---------------+---------+-----------+----------+--------------+ RIGHT    CompressibilityPhasicitySpontaneityPropertiesThrombus Aging +---------+---------------+---------+-----------+----------+--------------+ CFV      Full           Yes      Yes                                 +---------+---------------+---------+-----------+----------+--------------+ SFJ      Full                                                         +---------+---------------+---------+-----------+----------+--------------+ FV Prox  Full                                                        +---------+---------------+---------+-----------+----------+--------------+ FV Mid   Full                                                        +---------+---------------+---------+-----------+----------+--------------+ FV DistalFull                                                        +---------+---------------+---------+-----------+----------+--------------+  PFV      Full                                                        +---------+---------------+---------+-----------+----------+--------------+ POP      Full           Yes      Yes                                 +---------+---------------+---------+-----------+----------+--------------+ PTV      Full                                                        +---------+---------------+---------+-----------+----------+--------------+ PERO     Full                                                        +---------+---------------+---------+-----------+----------+--------------+   +---------+---------------+---------+-----------+----------+--------------+ LEFT     CompressibilityPhasicitySpontaneityPropertiesThrombus Aging +---------+---------------+---------+-----------+----------+--------------+ CFV      Full           Yes      Yes                                 +---------+---------------+---------+-----------+----------+--------------+ SFJ      Full                                                        +---------+---------------+---------+-----------+----------+--------------+ FV Prox  Full                                                        +---------+---------------+---------+-----------+----------+--------------+ FV Mid   Full                                                         +---------+---------------+---------+-----------+----------+--------------+ FV DistalFull                                                        +---------+---------------+---------+-----------+----------+--------------+ PFV      Full                                                        +---------+---------------+---------+-----------+----------+--------------+  POP      Full           Yes      Yes                                 +---------+---------------+---------+-----------+----------+--------------+ PTV      Full                                                        +---------+---------------+---------+-----------+----------+--------------+     Summary: BILATERAL: - No evidence of deep vein thrombosis seen in the lower extremities, bilaterally. - No evidence of superficial venous thrombosis in the lower extremities, bilaterally. -   *See table(s) above for measurements and observations.    Preliminary     Labs:  CBC: Recent Labs    06/29/20 0904 06/29/20 0904 06/29/20 0915 06/29/20 1738 06/30/20 0043 07/01/20 0433  WBC 9.0  --   --   --  9.7 11.2*  HGB 13.9   < > 14.3 11.9* 12.2* 11.7*  HCT 41.2   < > 42.0 35.0* 36.2* 34.2*  PLT 320  --   --   --  380 329   < > = values in this interval not displayed.    COAGS: Recent Labs    06/29/20 0904  INR 1.0  APTT 28    BMP: Recent Labs    06/29/20 0904 06/29/20 0904 06/29/20 0915 06/29/20 1738 06/30/20 0043 07/01/20 0433  NA 136   < > 138 138 138 138  K 5.0   < > 4.5 3.8 3.9 3.6  CL 103  --  105  --  106 106  CO2 20*  --   --   --  20* 21*  GLUCOSE 104*  --  105*  --  113* 107*  BUN 14  --  17  --  10 8  CALCIUM 9.1  --   --   --  8.4* 8.5*  CREATININE 0.87  --  0.70  --  0.89 0.85  GFRNONAA >60  --   --   --  >60 >60  GFRAA >60  --   --   --  >60  --    < > = values in this interval not displayed.    LIVER FUNCTION TESTS: Recent Labs    06/29/20 0904  BILITOT 1.8*  AST 36  ALT 17    ALKPHOS 61  PROT 6.5  ALBUMIN 3.3*    Assessment and Plan: R MCA CVA Patient extubated, working with therapy.  Still with left-sided weakness.  Considering CIR.  Continue Brilinta 90 mg BID, aspirin 81 mg daily despite stent occlusion. Reassess with US Carotid in 3 weeks for any recanalization.  IR remains available.   Electronically Signed: Docia Barrier, PA 07/01/2020, 9:57 AM   I spent a total of 15 Minutes at the the patient's bedside AND on the patient's [facility] floor or unit, greater than 50% of which was counseling/coordinating care for R MCA CVA.

## 2020-07-01 NOTE — Progress Notes (Signed)
NAME:  Tyler Richards, MRN:  [id number], DOB:  1951/03/16, LOS: 2 ADMISSION DATE:  06/29/2020, CONSULTATION DATE:  06/29/20 REFERRING MD:  Estanislado Pandy  CHIEF COMPLAINT:  AMS   Brief History   Tyler Richards is a 69 y.o. male who was admitted 10/4 with R MCA CVA s/p revascularization and R ICA stent assisted angioplasty and revascularization. Loaded with aspirin and Brilinta  Past Medical History  has Arterial ischemic stroke, MCA, right, acute ([place]); Stroke (cerebrum) ([place]); Acute encephalopathy; Hypertension; Acute respiratory failure with hypoxia ([facility]); and Middle cerebral artery embolism, right on their problem list.  Significant Hospital Events   10/4 > admit.  Consults:  PCCM.  Procedures:  ETT 10/4 > 10/5  Significant Diagnostic Tests:  CT / CTA head 10/4 > LVO R ICA / prox MCA. MRi brain 10/5 Acute infarct at the right basal ganglia with nonprogressive hemorrhage when correlated with prior CT. Less extensive patchy acute cortical infarct in the right MCA territory. The areas of acute infarction correlate well with pretreatment CTP. 2. 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 CT head 10/5 3.9 x 3.0 x 3.0 cm hyperdense region in the right posterior basal ganglia most consistent with parenchymal hematoma, given small internal fluid levels. Mild mass effect and surrounding edema. No idline shift. Chronic ischemic changes in the right posterior frontal region as seen previously. Echo 10/4 >  Left ventricular ejection fraction, by estimation, is 50 to 94%, grade 1 diastolic dysfunction  Micro Data:  COVID 10/4 > neg. Flu 10/4 > neg.  Antimicrobials:  None.   Interim history/subjective:  Patient went into severe bradycardia last night, it was recorded as 27 but without drop in blood pressure.  He was started on dopamine overnight, which was stopped this morning  Objective:  Blood pressure (!) 145/45, pulse 82, temperature 99.9 F (37.7  C), temperature source Oral, resp. rate (!) 23, height 6\' 2"  (1.88 m), weight 85.1 kg, SpO2 98 %.        Intake/Output Summary (Last 24 hours) at 07/01/2020 1330 Last data filed at 07/01/2020 1045 Gross per 24 hour  Intake 2461.86 ml  Output 2090 ml  Net 371.86 ml   Filed Weights   06/29/20 1730  Weight: 85.1 kg    Examination: General: Elderly Caucasian male, sitting on the chair Neuro: Awake, following commands.  Right side 5/5, left lower extremity 2/5, left upper extremity 0/5.  Left facial droop and tongue deviation to the right HEENT: [place]/AT. Sclerae anicteric.  Moist mucous membranes Cardiovascular: RRR, no M/R/G.  Lungs: Clear to auscultation bilaterally, no rhonchi or wheezes Abdomen: BS x 4, soft, NT/ND.  Musculoskeletal: No gross deformities, no edema.  Skin: Intact, warm, no rashes.  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 Acute respiratory insufficiency post procedure, resolved Acute encephalopathy due to current to stroke, improving Severe bradycardia  Continue aspirin and Brilinta Right ICA remain closed even after stent placement Blood pressure Goal SBP 120 - 160 per IR and neurology Continue secondary stroke prophylaxis Continue statin Echo showed normal LV systolic function with grade 1 diastolic dysfunction Patient remained on room air, able to maintain O2 sat His mental status is improved He is bradycardic, last night his heart rate went down to 27 but no change in blood pressure, he was started on dopamine last night, it was stopped this morning Cardiology consult is requested   Best  Practice:  Diet: NPO. Pain/Anxiety/Delirium protocol (if indicated): N/A VAP protocol (if indicated): N/A DVT prophylaxis: SCD's. GI prophylaxis: PPI. Glucose control: SSI if glucose consistently > 180. Mobility: Bedrest. Code Status: Full. Family Communication: Per primary. Disposition:  ICU.  Labs   CBC: Recent Labs  Lab 06/29/20 0904 06/29/20 0915 06/29/20 1738 06/30/20 0043 07/01/20 0433  WBC 9.0  --   --  9.7 11.2*  NEUTROABS 6.8  --   --  7.2  --   HGB 13.9 14.3 11.9* 12.2* 11.7*  HCT 41.2 42.0 35.0* 36.2* 34.2*  MCV 96.5  --   --  97.1 99.1  PLT 320  --   --  380 696   Basic Metabolic Panel: Recent Labs  Lab 06/29/20 0904 06/29/20 0915 06/29/20 1738 06/30/20 0043 07/01/20 0433  NA 136 138 138 138 138  K 5.0 4.5 3.8 3.9 3.6  CL 103 105  --  106 106  CO2 20*  --   --  20* 21*  GLUCOSE 104* 105*  --  113* 107*  BUN 14 17  --  10 8  CREATININE 0.87 0.70  --  0.89 0.85  CALCIUM 9.1  --   --  8.4* 8.5*   GFR: Estimated Creatinine Clearance: 94 mL/min (by C-G formula based on SCr of 0.85 mg/dL). Recent Labs  Lab 06/29/20 0904 06/30/20 0043 07/01/20 0433  WBC 9.0 9.7 11.2*   Liver Function Tests: Recent Labs  Lab 06/29/20 0904  AST 36  ALT 17  ALKPHOS 61  BILITOT 1.8*  PROT 6.5  ALBUMIN 3.3*   No results for input(s): LIPASE, AMYLASE in the last 168 hours. No results for input(s): AMMONIA in the last 168 hours. ABG    Component Value Date/Time   PHART 7.380 06/29/2020 1738   PCO2ART 41.3 06/29/2020 1738   PO2ART 252 (H) 06/29/2020 1738   HCO3 24.4 06/29/2020 1738   TCO2 26 06/29/2020 1738   ACIDBASEDEF 1.0 06/29/2020 1738   O2SAT 100.0 06/29/2020 1738    Coagulation Profile: Recent Labs  Lab 06/29/20 0904  INR 1.0   Cardiac Enzymes: No results for input(s): CKTOTAL, CKMB, CKMBINDEX, TROPONINI in the last 168 hours. HbA1C: Hgb A1c MFr Bld  Date/Time Value Ref Range Status  06/30/2020 12:43 AM 5.6 4.8 - 5.6 % Final    Comment:    (NOTE) Pre diabetes:          5.7%-6.4%  Diabetes:              >6.4%  Glycemic control for   <7.0% adults with diabetes    CBG: Recent Labs  Lab 06/29/20 0904  GLUCAP 88    Past medical history  He,  has no past medical history on file.   Surgical History   unknown  Social  History   reports that he has never smoked. He has never used smokeless tobacco. He reports previous alcohol use. He reports that he does not use drugs.   Family history   His family history is not on file.   Allergies No Known Allergies   Home meds  Prior to Admission medications   Medication Sig Start Date End Date Taking? Authorizing Provider  acetaminophen (TYLENOL) 325 MG tablet Take 650 mg by mouth every 6 (six) hours as needed for mild pain or headache.   Yes [provider]  aspirin EC 81 MG tablet Take 81 mg by mouth daily as needed for mild pain. Swallow whole.   Yes [provider]    Total critical care time: 30 minutes  Performed by: [place] care time was exclusive of separately billable procedures and treating other patients.   Critical care was necessary to treat or prevent imminent or life-threatening deterioration.   Critical care was time spent personally by me on the following activities: development of treatment plan with patient and/or surrogate as well as nursing, discussions with consultants, evaluation of patient's response to treatment, examination of patient, obtaining history from patient or surrogate, ordering and performing treatments and interventions, ordering and review of laboratory studies, ordering and review of radiographic studies, pulse oximetry and re-evaluation of patient's condition.   Jacky Kindle MD Critical care physician [facility]  Pager: [phone] Mobile: [phone]

## 2020-07-01 NOTE — H&P (Signed)
Physical Medicine and Rehabilitation Admission H&P    Chief Complaint  Patient presents with  . Code Stroke  : HPI: Tyler Richards is a 69 year old right-handed male with unremarkable past medical history no prescription medications.  Per chart review lives with spouse independent and active prior to admission.  1 level home 2 steps to entry.  Presented 06/29/2020 with acute onset of left-sided weakness and slurred speech.  Cranial CT scan showed hyperdense distal right ICA and proximal MCA.  Blunted appearance of the posterior right putamen.  Chronic right high frontal cortex infarct.  Patient did not receive TPA.  CT angiogram of head and neck emergent large vessel occlusion with no flow seen in the right internal carotid artery or proximal MCA.  Patient underwent right MCA thrombectomy and right ICA stent placement 06/29/2020 per interventional radiology. Most recent MRI and imaging revealed acute infarct right basal ganglia with nonprogressive hemorrhage when correlated with a prior CT of 06/29/2020.  Lower extremity Dopplers no signs of DVT.  Carotid Dopplers no ICA stenosis.  Echocardiogram with ejection fraction of 50 to 34% grade 1 diastolic dysfunction.  Admission chemistries unremarkable aside from glucose 104 urine drug screen negative.  Patient did receive cardiology consult for bradycardia consistent with hyper vagotonia and currently maintained on ProAmatine as well as Florinef.  EKG normal sinus rhythm 70s to 80s occasional sinus bradycardia.  Patient was initially maintained on aspirin as well as Brilinta.  Patient was extubated 06/30/2020.  Urine culture greater 100,000 Enterobacter placed on Maxipime changed to Bactrim.  Gastroenterology services consulted due to some bright red blood with bowel movement currently holding off on any endoscopic evaluation monitor hemoglobin hematocrit with latest hemoglobin 9.8 and no further episodes reported.  His Brilinta has been placed on hold after  rectal outlet bleeding and continues only on low-dose aspirin at the recommendations of neurology services.  Currently on a dysphagia #1 thin liquid diet.  Therapy evaluations completed and patient was admitted for a comprehensive rehab program.  Review of Systems  Constitutional: Negative for chills and fever.  Eyes: Negative for blurred vision and double vision.  Respiratory: Negative for cough and shortness of breath.   Cardiovascular: Negative for chest pain, palpitations and leg swelling.  Gastrointestinal: Positive for constipation. Negative for heartburn, nausea and vomiting.  Genitourinary: Negative for dysuria, flank pain and hematuria.  Musculoskeletal: Positive for myalgias.  Skin: Negative for rash.  Neurological: Positive for speech change and weakness.  All other systems reviewed and are negative.  History reviewed. No pertinent past medical history. Past Surgical History:  Procedure Laterality Date  . IR ANGIO INTRA EXTRACRAN SEL COM CAROTID INNOMINATE UNI L MOD SED  06/29/2020  . IR CT HEAD LTD  06/29/2020  . IR CT HEAD LTD  06/29/2020  . IR INTRAVSC STENT CERV CAROTID W/O EMB-PROT MOD SED INC ANGIO  06/29/2020  . IR PERCUTANEOUS ART THROMBECTOMY/INFUSION INTRACRANIAL INC DIAG ANGIO  06/29/2020  . RADIOLOGY WITH ANESTHESIA N/A 06/29/2020   Procedure: IR WITH ANESTHESIA;  Surgeon: Radiologist, Medication, MD;  Location: [facility];  Service: Radiology;  Laterality: N/A;   History reviewed. No pertinent family history. Social History:  reports that he has never smoked. He has never used smokeless tobacco. He reports previous alcohol use. He reports that he does not use drugs. Allergies: No Known Allergies Medications Prior to Admission  Medication Sig Dispense Refill  . acetaminophen (TYLENOL) 325 MG tablet Take 650 mg by mouth every 6 (six) hours as needed  for mild pain or headache.    Marland Kitchen aspirin EC 81 MG tablet Take 81 mg by mouth daily as needed for mild pain. Swallow whole.       Drug Regimen Review   Home: Home Living Family/patient expects to be discharged to:: Private residence (modular home) Living Arrangements: Spouse/significant other Available Help at Discharge: Family, Available 24 hours/day Type of Home: Mobile home Home Access: Stairs to enter [employer] of Steps: 2 Entrance Stairs-Rails: Right Home Layout: One level Bathroom Shower/Tub: Chiropodist: Standard Bathroom Accessibility: Yes Home Equipment: Grab bars - tub/shower, Radio producer - single point, Environmental consultant - 2 wheels, Wheelchair - manual  Lives With: Spouse   Functional History: Prior Function Level of Independence: Independent Comments: Independent for ADLs, walking. Drives.  Functional Status:  Mobility: Bed Mobility Overal bed mobility: Needs Assistance Bed Mobility: Supine to Sit, Sit to Supine Rolling: Mod assist, +2 for safety/equipment Sidelying to sit: +2 for physical assistance, +2 for safety/equipment, Mod assist Supine to sit: Mod assist, HOB elevated Sit to supine: Max assist General bed mobility comments: Cued pt to bring LEs to EOB and R hand to L bed rail to transition supine > sit EOB, modA to manage trunk and minA to manage LEs. Sit > supine with maxA, cuing pt to descend onto L elbow and assistance to manage trunk and LEs. Transfers Overall transfer level: Needs assistance Equipment used: None Transfer via Lift Equipment: Stedy Transfers: Sit to/from Stand Sit to Stand: Max assist, From elevated surface Stand pivot transfers: Total assist General transfer comment: Performed 5 mini-STS from raised EOB without rest and B knee block and TCs and assistance under buttocks and pt cued to lean chest on therapist's shoulder, maxA. Improved buttocks clearance noted with each subsequent rep.  Ambulation/Gait General Gait Details: unable    ADL: ADL Overall ADL's : Needs assistance/impaired Eating/Feeding: NPO Grooming: Brushing hair,  Minimal assistance, Sitting Grooming Details (indicate cue type and reason): requires assist for sitting balance without UE support  Upper Body Bathing: Moderate assistance, Sitting Lower Body Bathing: Maximal assistance, +2 for safety/equipment, +2 for physical assistance, Sitting/lateral leans, Sit to/from stand Upper Body Dressing : Moderate assistance, Sitting Lower Body Dressing: Total assistance, +2 for physical assistance, +2 for safety/equipment, Bed level Toileting- Clothing Manipulation and Hygiene: Total assistance, +2 for physical assistance, +2 for safety/equipment, Bed level Functional mobility during ADLs: Maximal assistance, +2 for physical assistance, +2 for safety/equipment General ADL Comments: pt seen for splint check approx 4hrs after splint re-donned post therapy session. pt with some slight indentation noted at lateral aspect of thumb which improved with time. pt without redness to medial aspect of thumb today. he denies pain/discomfort   Cognition: Cognition Overall Cognitive Status: Impaired/Different from baseline Arousal/Alertness: Awake/alert Orientation Level: Oriented X4 Attention: Sustained Sustained Attention: Appears intact (during brief intervals of time) Memory: Impaired Memory Impairment: Retrieval deficit, Decreased short term memory Decreased Short Term Memory: Verbal basic (recalled 2/4 words independently; 4/4 with cues) Awareness: Impaired Awareness Impairment: Emergent impairment Problem Solving: Impaired Problem Solving Impairment: Functional basic, Functional complex Executive Function: Self Monitoring, Self Correcting Self Monitoring: Impaired Self Monitoring Impairment: Functional basic Self Correcting: Impaired Self Correcting Impairment: Functional basic Safety/Judgment: Impaired Comments: L inattention Cognition Arousal/Alertness: Awake/alert Behavior During Therapy: WFL for tasks assessed/performed Overall Cognitive Status:  Impaired/Different from baseline Area of Impairment: Attention, Following commands, Safety/judgement, Problem solving Orientation Level: Time, Disoriented to Current Attention Level: Sustained Following Commands: Follows one step commands consistently, Follows one step commands  with increased time, Follows multi-step commands inconsistently Safety/Judgement: Decreased awareness of safety, Decreased awareness of deficits Awareness: Emergent Problem Solving: Slow processing, Decreased initiation, Difficulty sequencing, Requires tactile cues, Requires verbal cues General Comments: Requires extra time and one step cues repeated to follow directions. Pt anxious with standing due to fear of falling.  Physical Exam: Blood pressure 126/61, pulse (!) 54, temperature 98.1 F (36.7 C), temperature source Oral, resp. rate 15, height 6\' 2"  (1.88 m), weight 85.1 kg, SpO2 98 %.  General: Alert, No apparent distress HEENT: Head is normocephalic, atraumatic, PERRLA, EOMI, sclera anicteric, oral mucosa pink and moist, dentition intact, ext ear canals clear,  Neck: Supple without JVD or lymphadenopathy Heart: Bradycardia. No murmurs rubs or gallops Chest: CTA bilaterally without wheezes, rales, or rhonchi; no distress Abdomen: Soft, non-tender, non-distended, bowel sounds positive. Extremities: No clubbing, cyanosis, or edema. Pulses are 2+ Skin: Clean and intact without signs of breakdown Neuro: Patient is a bit lethargic but arousable.  She was able to state his birthday and reason for being in the hospital.  He does demonstrate some left-sided inattention.  Follows simple commands. Left facial droop.  RUE: 4/5 RLE: 4/5 LUE: 2/5 SA, EE, EF, 0/5 WE, hand grip LLE: 0/5 HF, KE, 1/5 DF, PF Psych: [name] affect is appropriate. Pt is cooperative   Results for orders placed or performed during the hospital encounter of 06/29/20 (from the past 48 hour(s))  CBC with Differential/Platelet     Status: Abnormal    Collection Time: 07/07/20  9:50 AM  Result Value Ref Range   WBC 8.0 4.0 - 10.5 K/uL   RBC 2.90 (L) 4.22 - 5.81 MIL/uL   Hemoglobin 9.4 (L) 13.0 - 17.0 g/dL   HCT 27.9 (L) 39 - 52 %   MCV 96.2 80.0 - 100.0 fL   MCH 32.4 26.0 - 34.0 pg   MCHC 33.7 30.0 - 36.0 g/dL   RDW 12.2 11.5 - 15.5 %   Platelets 460 (H) 150 - 400 K/uL   nRBC 0.0 0.0 - 0.2 %   Neutrophils Relative % 78 %   Neutro Abs 6.3 1.7 - 7.7 K/uL   Lymphocytes Relative 10 %   Lymphs Abs 0.8 0.7 - 4.0 K/uL   Monocytes Relative 11 %   Monocytes Absolute 0.9 0.1 - 1.0 K/uL   Eosinophils Relative 0 %   Eosinophils Absolute 0.0 0.0 - 0.5 K/uL   Basophils Relative 0 %   Basophils Absolute 0.0 0.0 - 0.1 K/uL   Immature Granulocytes 1 %   Abs Immature Granulocytes 0.04 0.00 - 0.07 K/uL    Comment: Performed at [facility], [address], [place], [place] [address]  Comprehensive metabolic panel     Status: Abnormal   Collection Time: 07/07/20  9:50 AM  Result Value Ref Range   Sodium 137 135 - 145 mmol/L   Potassium 3.6 3.5 - 5.1 mmol/L   Chloride 108 98 - 111 mmol/L   CO2 22 22 - 32 mmol/L   Glucose, Bld 120 (H) 70 - 99 mg/dL    Comment: Glucose reference range applies only to samples taken after fasting for at least 8 hours.   BUN 11 8 - 23 mg/dL   Creatinine, Ser 0.69 0.61 - 1.24 mg/dL   Calcium 8.0 (L) 8.9 - 10.3 mg/dL   Total Protein 5.2 (L) 6.5 - 8.1 g/dL   Albumin 1.8 (L) 3.5 - 5.0 g/dL   AST 105 (H) 15 - 41 U/L  ALT 79 (H) 0 - 44 U/L   Alkaline Phosphatase 134 (H) 38 - 126 U/L   Total Bilirubin 0.7 0.3 - 1.2 mg/dL   GFR, Estimated >60 >60 mL/min   Anion gap 7 5 - 15    Comment: Performed at [facility] [address] [place], [place] [address]  Magnesium     Status: None   Collection Time: 07/07/20  9:50 AM  Result Value Ref Range   Magnesium 2.1 1.7 - 2.4 mg/dL    Comment: Performed at [facility] [address] [place], [place] [address]  Phosphorus     Status: None    Collection Time: 07/07/20  9:50 AM  Result Value Ref Range   Phosphorus 2.5 2.5 - 4.6 mg/dL    Comment: Performed at [facility] [address] [place], [place] [address]  Hepatitis panel, acute     Status: None   Collection Time: 07/08/20  1:47 AM  Result Value Ref Range   Hepatitis B Surface Ag NON REACTIVE NON REACTIVE   HCV Ab NON REACTIVE NON REACTIVE    Comment: (NOTE) Nonreactive HCV antibody screen is consistent with no HCV infections,  unless recent infection is suspected or other evidence exists to indicate HCV infection.     Hep A IgM NON REACTIVE NON REACTIVE   Hep B C IgM NON REACTIVE NON REACTIVE    Comment: Performed at [facility], [place] [address], [place], [place] [address]  CBC with Differential/Platelet     Status: Abnormal   Collection Time: 07/08/20  1:47 AM  Result Value Ref Range   WBC 7.8 4.0 - 10.5 K/uL   RBC 3.02 (L) 4.22 - 5.81 MIL/uL   Hemoglobin 9.8 (L) 13.0 - 17.0 g/dL   HCT 29.1 (L) 39 - 52 %   MCV 96.4 80.0 - 100.0 fL   MCH 32.5 26.0 - 34.0 pg   MCHC 33.7 30.0 - 36.0 g/dL   RDW 12.2 11.5 - 15.5 %   Platelets 472 (H) 150 - 400 K/uL   nRBC 0.0 0.0 - 0.2 %   Neutrophils Relative % 71 %   Neutro Abs 5.6 1.7 - 7.7 K/uL   Lymphocytes Relative 16 %   Lymphs Abs 1.2 0.7 - 4.0 K/uL   Monocytes Relative 11 %   Monocytes Absolute 0.8 0.1 - 1.0 K/uL   Eosinophils Relative 1 %   Eosinophils Absolute 0.1 0.0 - 0.5 K/uL   Basophils Relative 1 %   Basophils Absolute 0.0 0.0 - 0.1 K/uL   Immature Granulocytes 0 %   Abs Immature Granulocytes 0.03 0.00 - 0.07 K/uL    Comment: Performed at [facility] [address] [place],  [address]  Comprehensive metabolic panel     Status: Abnormal   Collection Time: 07/08/20  1:47 AM  Result Value Ref Range   Sodium 138 135 - 145 mmol/L   Potassium 3.7 3.5 - 5.1 mmol/L   Chloride 107 98 - 111 mmol/L   CO2 23 22 - 32 mmol/L   Glucose, Bld 109 (H) 70 - 99 mg/dL    Comment: Glucose  reference range applies only to samples taken after fasting for at least 8 hours.   BUN 10 8 - 23 mg/dL   Creatinine, Ser 0.74 0.61 - 1.24 mg/dL   Calcium 8.0 (L) 8.9 - 10.3 mg/dL   Total Protein 5.6 (L) 6.5 - 8.1 g/dL   Albumin 1.9 (  L) 3.5 - 5.0 g/dL   AST 97 (H) 15 - 41 U/L   ALT 81 (H) 0 - 44 U/L   Alkaline Phosphatase 144 (H) 38 - 126 U/L   Total Bilirubin 0.6 0.3 - 1.2 mg/dL   GFR, Estimated >60 >60 mL/min   Anion gap 8 5 - 15    Comment: Performed at [facility] [address] [place], [place] [address]  Phosphorus     Status: None   Collection Time: 07/08/20  1:47 AM  Result Value Ref Range   Phosphorus 2.9 2.5 - 4.6 mg/dL    Comment: Performed at [facility] [address] [place], [place] [address]  Magnesium     Status: None   Collection Time: 07/08/20  1:47 AM  Result Value Ref Range   Magnesium 2.0 1.7 - 2.4 mg/dL    Comment: Performed at [facility] [address], [place], [place] [address]  Magnesium     Status: None   Collection Time: 07/08/20 10:31 PM  Result Value Ref Range   Magnesium 2.0 1.7 - 2.4 mg/dL    Comment: Performed at [facility] [address] [place], [place] [address]  Phosphorus     Status: Abnormal   Collection Time: 07/08/20 10:31 PM  Result Value Ref Range   Phosphorus 2.2 (L) 2.5 - 4.6 mg/dL    Comment: Performed at [facility] [address], [place], [place] [address]   US Abdomen Limited RUQ  Result Date: 07/08/2020 CLINICAL DATA:  Abnormal liver function tests EXAM: ULTRASOUND ABDOMEN LIMITED RIGHT UPPER QUADRANT COMPARISON:  None. FINDINGS: Gallbladder: No gallstones or wall thickening visualized. No sonographic Murphy sign noted by sonographer. Common bile duct: Diameter: 4 mm in proximal diameter Liver: No focal lesion identified. Within normal limits in parenchymal echogenicity. Portal vein is patent on color Doppler imaging with normal direction of blood flow towards the liver. Other: None.  IMPRESSION: Normal examination Electronically Signed   By: Fidela Salisbury MD   On: 07/08/2020 02:48   Medical Problem List and Plan: 1.  Left side weakness and slurred speech secondary to right MCA infarction due to right ICA and MCA occlusion status post revascularization and stenting  -patient may shower  -ELOS/Goals: 2-3 weeks modI 2.  Antithrombotics: -DVT/anticoagulation: Venous Doppler studies negative.  SCDs  -antiplatelet therapy: Aspirin 81 mg daily, currently off of Brilinta given GIB and ICA has reoccluded 3. Pain Management: Tylenol as needed. Well controlled 4. Mood: Provide emotional support  -antipsychotic agents: N/A 5. Neuropsych: This patient is capable of making decisions on his own behalf. 6. Skin/Wound Care: Routine skin checks 7. Fluids/Electrolytes/Nutrition: Routine in and outs with follow-up chemistries. K+ 3.3 on 10/14- received 102meq Klor.  8.  Constipation.  MiraLAX daily and Colace twice daily. No BM on this regimen but would like to hold off on additional medications at this time.  9.  Hyperlipidemia.  Lipitor 10.  Gout.  Colchicine twice daily.  Monitor for any gout flareups 11.  Orthostasis/bradycardia.  ProAmatine 5 mg every 8 hours as well as Florinef 0.1 mg daily.  Monitor with increased mobility.  Follow-up per cardiology services 12.  Enterobacter UTI.  Completing course of Bactrim. 13.  Rectal bleeding.  Follow-up GI services.  Currently no plan for endoscopic studies and monitor hemoglobin hematocrit.  Awaiting plan for possible colonoscopy   Cathlyn Parsons, PA-C 07/09/2020   I have personally performed a face to  face diagnostic evaluation, including, but not limited to relevant history and physical exam findings, of this patient and developed relevant assessment and plan.  Additionally, I have reviewed and concur with the physician assistant's documentation above.  Leeroy Cha, MD

## 2020-07-01 NOTE — Progress Notes (Signed)
Modified Barium Swallow Progress Note  Patient Details  Name: Tyler Richards MRN: [id number] Date of Birth: 26-Jul-1951  Today's Date: 07/01/2020  Modified Barium Swallow completed.  Full report located under Chart Review in the Imaging Section.  Brief recommendations include the following:  Clinical Impression   Pt has an oral more than pharyngeal dysphagia, with oral preparation and transit impacted by reduced strength and suspect sensation as well. He has intermittent trouble getting liquids via cup or straw, with reduced seal and sucking. There is anterior spillage with thin liquids; L buccal pocketing with more solid consistencies and slower posterior transit. Mild premature spillage occurs with thin liquids. He has relatively improved pharyngeal function with only mildly reduced base of tongue retraction that results in trace-mild vallecular residue with purees and mild residue with solids. Recommend starting Dys 1 diet and thin liquids. Will f/u for advancement of solids clinically.    Swallow Evaluation Recommendations       SLP Diet Recommendations: Dysphagia 1 (Puree) solids;Thin liquid   Liquid Administration via: Straw;Cup   Medication Administration: Crushed with puree   Supervision: Staff to assist with self feeding;Full supervision/cueing for compensatory strategies   Compensations: Minimize environmental distractions;Slow rate;Small sips/bites;Lingual sweep for clearance of pocketing   Postural Changes: Seated upright at 90 degrees   Oral Care Recommendations: Oral care before and after PO   Other Recommendations: Have oral suction available    [name], M.A. [place] Pager [phone] Office [phone]  07/01/2020,2:13 PM

## 2020-07-01 NOTE — Progress Notes (Signed)
Inpatient Rehabilitation Admissions Coordinator  I met with patient and his wife at bedside. We discussed goals and expectations of a possible [facility] admit. Wife prefers [facility]. I await medical clearance as well as bed availability to admit to [facility]. I will follow up tomorrow.  Danne Baxter, RN, MSN Rehab Admissions Coordinator [phone] 07/01/2020 12:09 PM

## 2020-07-01 NOTE — Progress Notes (Signed)
Physical Therapy Treatment Patient Details Name: Tyler Richards MRN: [id number] DOB: June 29, 1951 Today's Date: 07/01/2020    History of Present Illness Patient is a 69 y/o male who presents with left sided weakness, right gaze and slurred speech. NIH:17. Head CT- dense Right MCA infarct. CTA- Right ICA and MCA occlusions. s/p right MCA thrombectomy and right ICA stent placement 06/29/20 with post procedure hemmorhage. No PMH.    PT Comments    Pt continues to present with significant L lateral lean in standing and sitting due to impaired sensation on L side, L hemiplegia, L sided neglect, and impaired balance. Pt requiring max very cues to look to the L, pt reports double vision as well. Pt tolerated EOB x 5 min initially with maxA to maintain midline position transitioning to close min guard. Pt unable to achieve full upright standing even in stedy at this time. Continue to recommend CIR upon d/c. Acute PT to cont to follow.   Follow Up Recommendations  CIR;Supervision for mobility/OOB     Equipment Recommendations  Other (comment)    Recommendations for Other Services       Precautions / Restrictions Precautions Precautions: Fall;Other (comment) Precaution Comments: watch HR, becomes bradycardic Restrictions Weight Bearing Restrictions: No    Mobility  Bed Mobility Overal bed mobility: Needs Assistance Bed Mobility: Rolling;Sidelying to Sit Rolling: Max assist Sidelying to sit: Max assist;+2 for physical assistance       General bed mobility comments: maxA to elevated trunk and manage LEs off EOB  Transfers Overall transfer level: Needs assistance Equipment used: Ambulation equipment used Transfers: Sit to/from Stand Sit to Stand: Max assist;+2 physical assistance         General transfer comment: maxAx2 with gait belt and bed pad to power up into stedy, pt unable to achieve full upright trunk even in stedy without maxA on L side due to significant L lateral lean. Pt   dependent for std pvt via stedy  Ambulation/Gait             General Gait Details: unable   Stairs             Wheelchair Mobility    Modified Rankin (Stroke Patients Only) Modified Rankin (Stroke Patients Only) Pre-Morbid Rankin Score: No symptoms Modified Rankin: Severe disability     Balance Overall balance assessment: Needs assistance Sitting-balance support: Feet supported;Single extremity supported Sitting balance-Leahy Scale: Poor Sitting balance - Comments: pt initially maxA and progress to close min guard with support of R UE, max verbal cues to not push with R UE and to maintain midline by staying infront and in line with rehab tech at EOB   Standing balance support: During functional activity Standing balance-Leahy Scale: Zero Standing balance comment: max A of 2 to partially stand                            Cognition Arousal/Alertness: Awake/alert Behavior During Therapy: WFL for tasks assessed/performed Overall Cognitive Status: Impaired/Different from baseline Area of Impairment: Safety/judgement;Attention;Following commands;Awareness;Problem solving                   Current Attention Level: Sustained   Following Commands: Follows one step commands with increased time Safety/Judgement: Decreased awareness of safety;Decreased awareness of deficits (L sided neglect) Awareness: Intellectual Problem Solving: Slow processing;Difficulty sequencing;Decreased initiation;Requires verbal cues;Requires tactile cues General Comments: A&Ox4, sleepy upon arrival but more awake once sitting EOB, difficulty sequencing and self correcting  Exercises      General Comments General comments (skin integrity, edema, etc.): wife present, Pts HR in 41s, aware of bradycardia events, L shld subluxation noted      Pertinent Vitals/Pain Pain Assessment: No/denies pain    Home Living                      Prior Function             PT Goals (current goals can now be found in the care plan section) Acute Rehab PT Goals Patient Stated Goal: to return to PLOF Progress towards PT goals: Progressing toward goals    Frequency    Min 4X/week      PT Plan Current plan remains appropriate    Co-evaluation              AM-PAC PT "6 Clicks" Mobility   Outcome Measure  Help needed turning from your back to your side while in a flat bed without using bedrails?: A Lot Help needed moving from lying on your back to sitting on the side of a flat bed without using bedrails?: A Lot Help needed moving to and from a bed to a chair (including a wheelchair)?: Total Help needed standing up from a chair using your arms (e.g., wheelchair or bedside chair)?: Total Help needed to walk in hospital room?: Total Help needed climbing 3-5 steps with a railing? : Total 6 Click Score: 8    End of Session Equipment Utilized During Treatment: Gait belt Activity Tolerance: Patient tolerated treatment well;Patient limited by fatigue Patient left: in bed;with call bell/phone within reach;with bed alarm set;with family/visitor present;with nursing/sitter in room Nurse Communication: Mobility status PT Visit Diagnosis: Hemiplegia and hemiparesis Hemiplegia - Right/Left: Left Hemiplegia - dominant/non-dominant: Non-dominant Hemiplegia - caused by: Cerebral infarction     Time: 0815-0849 PT Time Calculation (min) (ACUTE ONLY): 34 min  Charges:  $Therapeutic Activity: 8-22 mins $Neuromuscular Re-education: 8-22 mins                     Tyler Richards, PT, DPT Acute Rehabilitation Services Pager #: [phone] Office #: [phone]    Tyler Richards 07/01/2020, 9:07 AM

## 2020-07-01 NOTE — Evaluation (Signed)
Clinical/Bedside Swallow Evaluation Patient Details  Name: Tyler Richards MRN: [id number] Date of Birth: 1951/02/03  Today's Date: 07/01/2020 Time: SLP Start Time (ACUTE ONLY): 1025 SLP Stop Time (ACUTE ONLY): 1045 SLP Time Calculation (min) (ACUTE ONLY): 20 min  Past Medical History: History reviewed. No pertinent past medical history. Past Surgical History:  Past Surgical History:  Procedure Laterality Date  . RADIOLOGY WITH ANESTHESIA N/A 06/29/2020   Procedure: IR WITH ANESTHESIA;  Surgeon: Radiologist, Medication, MD;  Location: [place];  Service: Radiology;  Laterality: N/A;   HPI:  Patient is a 69 y/o male who presents with left sided weakness, right gaze and slurred speech. NIH:17. Head CT- dense Right MCA infarct. CTA- Right ICA and MCA occlusions. s/p right MCA thrombectomy and right ICA stent placement 06/29/20 with post procedure hemmorhage. ETT 10/4-10/5. No PMH.   Assessment / Plan / Recommendation Clinical Impression  Pt has signs of an oral dysphagia, including L sided buccal pocketing and anterior bolus loss, with impairments most prominent with soft solids even after further softening them in purees. Min cues were given for self-maintenance. He has better clearance with thin liquids and purees, with no overt s/s of aspiration noted until challenged with larger volumes of thin liquids, at which point coughing ensues. I suspect that he is ready for a PO diet, but would pursue MBS prior to initiating. This is scheduled tentatively for early this afternoon. In the interim, could consider meds crushed in puree and ice chips vs small, single sips of water.    SLP Visit Diagnosis: Dysphagia, oral phase (R13.11)    Aspiration Risk  Moderate aspiration risk;Mild aspiration risk    Diet Recommendation NPO except meds;Ice chips PRN after oral care   Medication Administration: Crushed with puree Supervision: Staff to assist with self feeding    Other  Recommendations Oral Care  Recommendations: Oral care QID Other Recommendations: Have oral suction available   Follow up Recommendations Inpatient Rehab      Frequency and Duration            Prognosis Prognosis for Safe Diet Advancement: Good      Swallow Study   General HPI: Patient is a 69 y/o male who presents with left sided weakness, right gaze and slurred speech. NIH:17. Head CT- dense Right MCA infarct. CTA- Right ICA and MCA occlusions. s/p right MCA thrombectomy and right ICA stent placement 06/29/20 with post procedure hemmorhage. ETT 10/4-10/5. No PMH. Type of Study: Bedside Swallow Evaluation Previous Swallow Assessment: none in chart Diet Prior to this Study: NPO Temperature Spikes Noted: Yes (100.6) Respiratory Status: Room air History of Recent Intubation: Yes Length of Intubations (days): 1 days Date extubated: 07/01/20 Behavior/Cognition: Alert;Cooperative;Requires cueing Oral Cavity Assessment: Within Functional Limits Oral Care Completed by SLP: Yes Oral Cavity - Dentition: Adequate natural dentition Vision: Impaired for self-feeding Self-Feeding Abilities: Needs assist Patient Positioning: Upright in chair Baseline Vocal Quality: Normal Volitional Swallow: Able to elicit    Oral/Motor/Sensory Function Overall Oral Motor/Sensory Function: Moderate impairment Facial ROM: Reduced left;Suspected CN VII (facial) dysfunction Facial Symmetry: Abnormal symmetry left;Suspected CN VII (facial) dysfunction Facial Strength: Reduced left;Suspected CN VII (facial) dysfunction Lingual ROM: Reduced left;Suspected CN XII (hypoglossal) dysfunction Lingual Symmetry: Within Functional Limits Lingual Strength: Reduced;Suspected CN XII (hypoglossal) dysfunction Lingual Sensation: Reduced;Suspected CN VII (facial) dysfunction-anterior 2/3 tongue Velum: Within Functional Limits   Ice Chips Ice chips: Within functional limits Presentation: Spoon   Thin Liquid Thin Liquid: Impaired Presentation:  Cup;Self Fed;Straw;Spoon Pharyngeal  Phase Impairments:  Cough - Immediate    Nectar Thick Nectar Thick Liquid: Not tested   Honey Thick Honey Thick Liquid: Not tested   Puree Puree: Impaired Presentation: Self Fed;Spoon Oral Phase Impairments: Reduced labial seal Oral Phase Functional Implications: Left anterior spillage   Solid     Solid: Impaired Presentation: Self Fed;Spoon Oral Phase Impairments: Reduced labial seal Oral Phase Functional Implications: Left anterior spillage;Left lateral sulci pocketing      [name], M.A. [place] Pager [phone] Office [phone]  07/01/2020,11:13 AM

## 2020-07-01 NOTE — Consult Note (Addendum)
ELECTROPHYSIOLOGY CONSULT NOTE    Patient ID: Tyler Richards MRN: [id number], DOB/AGE: [birth year] 69 y.o.  Admit date: 06/29/2020 Date of Consult: 07/01/2020  Primary Physician: Patient, No Pcp Per Primary Cardiologist: No primary care provider on file.  Electrophysiologist:  New  Referring Provider: Dr. Erlinda Hong  Patient Profile: Tyler Richards is a 69 y.o. male with a history of gout, HTN, HLD, and carotid artery disease with admission for CVA who is being seen today for the evaluation of bradycardia at the request of Dr. Erlinda Hong.  HPI:  Tyler Richards is a 69 y.o. male with medical history as above.   Pt was admitted 10/4 with left-sided weakness, right gaze, left facial droop, and fall. tPA not given due to time. Imaging showed right MCA infact due to right ICA and MCA occlusion s/p IR with TICI2c and carotid stenting, secondary to large vessel disease source.   Repeat imaging showed Right MCA patent but right ICA re-occluded (confirmed by carotid dopplers). LE doppler negative for DVT. Echo this admission shows EF 55%.     Pt is overall recovering and being worked up for [employer]   Overnight, pt noted to have bradycardia into the 30-40s while asleep. Has been on NE infusion for goal SBP 140-160.   Dopamine started at 2.5 mcg/min. Pt continued to HRs in the upper 20s low 30s and Dopamine increased.   EP asked to see for further evaluation and recommendation.   Dopamine is now off. Pt Hrs in 50-70s with occasional asymptomatic sinus slowing into the 30s.   On my exam pt used pulmonary toilet and maintained rates in the 60-70s. Several seconds after he had slowing into the 30s. He denies any symptoms during this time.   He reports syncope back in July while working out in the heat with poor hydration. He was putting together a dog fence. He felt lightheaded, sat down, then has a brief loss of consciousness per his wife.  They think he may have lost consciousness in the setting of his stroke PTA, but  are not entirely sure. Denies palpitations or chest pain. No dyspnea.  He continues to have L sided facial droop and weakness.   Medical History HTN HLD Gout  Surgical History:  Past Surgical History:  Procedure Laterality Date   RADIOLOGY WITH ANESTHESIA N/A 06/29/2020   Procedure: IR WITH ANESTHESIA;  Surgeon: Radiologist, Medication, MD;  Location: [place];  Service: Radiology;  Laterality: N/A;     Medications Prior to Admission  Medication Sig Dispense Refill Last Dose   acetaminophen (TYLENOL) 325 MG tablet Take 650 mg by mouth every 6 (six) hours as needed for mild pain or headache.   Past Month at Unknown time   aspirin EC 81 MG tablet Take 81 mg by mouth daily as needed for mild pain. Swallow whole.   Past Month at Unknown time    Inpatient Medications:    stroke: mapping our early stages of recovery book   Does not apply Once   aspirin  81 mg Oral Daily   Or   aspirin  81 mg Per Tube Daily   Chlorhexidine Gluconate Cloth  6 each Topical Daily   docusate  100 mg Per Tube BID   [START ON 07/02/2020] influenza vaccine adjuvanted  0.5 mL Intramuscular Tomorrow-1000   pantoprazole (PROTONIX) IV  40 mg Intravenous Daily   [START ON 07/02/2020] pneumococcal 23 valent vaccine  0.5 mL Intramuscular Tomorrow-1000   polyethylene glycol  17 g Per  Tube Daily   ticagrelor  90 mg Oral BID   Or   ticagrelor  90 mg Per Tube BID    Allergies: No Known Allergies  Social History   Socioeconomic History   Marital status: Married    Spouse name: Not on file   Number of children: Not on file   Years of education: Not on file   Highest education level: Not on file  Occupational History   Not on file  Tobacco Use   Smoking status: Never Smoker   Smokeless tobacco: Never Used  Substance and Sexual Activity   Alcohol use: Not Currently   Drug use: Never   Sexual activity: Not on file  Other Topics Concern   Not on file  Social History Narrative   Not on file   Social  Determinants of Health   Financial Resource Strain:    Difficulty of Paying Living Expenses: Not on file  Food Insecurity:    Worried About [place] in the Last Year: Not on file   Ran Out of Food in the Last Year: Not on file  Transportation Needs:    Lack of Transportation (Medical): Not on file   Lack of Transportation (Non-Medical): Not on file  Physical Activity:    Days of Exercise per Week: Not on file   Minutes of Exercise per Session: Not on file  Stress:    Feeling of Stress : Not on file  Social Connections:    Frequency of Communication with Friends and Family: Not on file   Frequency of Social Gatherings with Friends and Family: Not on file   Attends Religious Services: Not on file   Active Member of Clubs or Organizations: Not on file   Attends Archivist Meetings: Not on file   Marital Status: Not on file  Intimate Partner Violence:    Fear of Current or Ex-Partner: Not on file   Emotionally Abused: Not on file   Physically Abused: Not on file   Sexually Abused: Not on file     History reviewed. No pertinent family history.   Review of Systems: All other systems reviewed and are otherwise negative except as noted above.  Physical Exam: Vitals:   07/01/20 0858 07/01/20 0900 07/01/20 0908 07/01/20 0915  BP: (!) 98/46 (!) 105/50 (!) 111/52 (!) 108/48  Pulse: (!) 51 (!) 50 (!) 50   Resp: (!) 27 (!) 28 (!) 25   Temp:      TempSrc:      SpO2: 99% 99% 100%   Weight:      Height:        GEN- The patient is well appearing, alert and oriented x 3 today.   HEENT: normocephalic, atraumatic; sclera clear, conjunctiva pink; hearing intact; oropharynx clear; neck supple Lungs- Clear to ausculation bilaterally, normal work of breathing.  No wheezes, rales, rhonchi Heart- Regular rate and rhythm, no murmurs, rubs or gallops GI- soft, non-tender, non-distended, bowel sounds present Extremities- no clubbing, cyanosis, or edema; DP/PT/radial pulses  2+ bilaterally MS- no significant deformity or atrophy Skin- warm and dry, no rash or lesion Psych- euthymic mood, full affect Neuro- strength and sensation are intact  Labs:   Lab Results  Component Value Date   WBC 11.2 (H) 07/01/2020   HGB 11.7 (L) 07/01/2020   HCT 34.2 (L) 07/01/2020   MCV 99.1 07/01/2020   PLT 329 07/01/2020    Recent Labs  Lab 06/29/20 0904 06/29/20 0915 07/01/20 0433  NA  136   < > 138  K 5.0   < > 3.6  CL 103   < > 106  CO2 20*   < > 21*  BUN 14   < > 8  CREATININE 0.87   < > 0.85  CALCIUM 9.1   < > 8.5*  PROT 6.5  --   --   BILITOT 1.8*  --   --   ALKPHOS 61  --   --   ALT 17  --   --   AST 36  --   --   GLUCOSE 104*   < > 107*   < > = values in this interval not displayed.      Radiology/Studies: CT Code Stroke CTA Head W/WO contrast  Result Date: 06/29/2020 CLINICAL DATA:  Hyperdense vessel and stroke-like symptoms EXAM: CT ANGIOGRAPHY HEAD AND NECK CT PERFUSION BRAIN TECHNIQUE: Multidetector CT imaging of the head and neck was performed using the standard protocol during bolus administration of intravenous contrast. Multiplanar CT image reconstructions and MIPs were obtained to evaluate the vascular anatomy. Carotid stenosis measurements (when applicable) are obtained utilizing NASCET criteria, using the distal internal carotid diameter as the denominator. Multiphase CT imaging of the brain was performed following IV bolus contrast injection. Subsequent parametric perfusion maps were calculated using RAPID software. CONTRAST:  Dose is not yet known COMPARISON:  None. FINDINGS: CTA NECK FINDINGS Aortic arch: Mild atheromatous changes. Three vessel branching. No acute finding. Right carotid system: Atherosclerotic plaque about the bifurcation with occluded ICA bulb. No flow seen within the ICA in the neck. Left carotid system: Atheromatous plaque at the bifurcation and bulb. No stenosis or ulceration. Negative for beading. Vertebral arteries:  Low-density plaque at the left subclavian origin. No flow limiting subclavian stenosis. Plaque at the left vertebral origin without stenosis, beading, or dissection. Skeleton: Focal advanced C5-6 disc degeneration with ridging causing biforaminal impingement. Ordinary facet osteoarthritis. Other neck: No acute or aggressive finding. Right-sided chronic sinusitis with pattern middle meatus obstruction. Upper chest: Granulomatous nodal calcifications. There is also a calcified granuloma appearance in the left upper lobe. Review of the MIP images confirms the above findings CTA HEAD FINDINGS Anterior circulation: No flow seen in the right carotid or MCA branches. There is distal M2 M3 branch reconstitution. Calcified plaque along the left carotid siphon. No MCA branch occlusion, aneurysm, or beading. Posterior circulation: The vertebral and basilar arteries are smooth and widely patent. High-grade atheromatous narrowings of the bilateral PCA, P3 segment on the right and upper first order branch on the left. Venous sinuses: Patent as permitted by contrast timing Anatomic variants: None significant Review of the MIP images confirms the above findings CT Brain Perfusion Findings: ASPECTS: 9 CBF (<30%) Volume: 44mL Perfusion (Tmax>6.0s) volume: 155mL Mismatch Volume: 123mL Infarction Location:Right basal ganglia and lesser extensive lateral frontal cortex infarct. Critical Value/emergent results were called by telephone at the time of interpretation on 06/29/2020 at 9:37 am to provider ERIC [facility] , who verbally acknowledged these results. IMPRESSION: 1. Emergent large vessel occlusion with no flow seen in the right internal carotid or proximal MCA. There is a 36 cc core infarct and 142 cc of penumbra by CT perfusion. 2. Atherosclerosis without flow limiting stenosis of the other major vessels. 3. Bilateral high-grade atheromatous PCA narrowings, more proximal on the right. 4. Incidental chronic right-sided sinusitis from  middle meatus obstruction. Electronically Signed   By: Monte Fantasia M.D.   On: 06/29/2020 09:53   CT HEAD WO CONTRAST  Result Date: 07/01/2020 CLINICAL DATA:  Follow-up intracranial hemorrhage EXAM: CT HEAD WITHOUT CONTRAST TECHNIQUE: Contiguous axial images were obtained from the base of the skull through the vertex without intravenous contrast. COMPARISON:  CT from 2 days ago FINDINGS: Brain: More contracted and homogeneous hematoma in the right basal ganglia measuring up to 2.8 x 2 cm. Cytotoxic edema from infarct has become apparent in the right basal ganglia and adjacent frontal insular cortex. No interval hemorrhage, hydrocephalus, or midline shift. Vascular: The right ICA is hyperdense below the skull base, unchanged. Skull: No acute or aggressive finding Sinuses/Orbits: Chronic right maxillary, ethmoid, and frontal sinusitis. Generalized mucosal thickening seen elsewhere, progressed. IMPRESSION: 1. Interval contraction of the right basal ganglia hematoma. 2. Expected evolution of the right frontoinsular infarcts. 3. 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 4. No new abnormality. Electronically Signed   By: Monte Fantasia M.D.   On: 07/01/2020 07:16   CT HEAD WO CONTRAST  Result Date: 06/29/2020 CLINICAL DATA:  Follow-up stroke and subsequent intervention. EXAM: CT HEAD WITHOUT CONTRAST TECHNIQUE: Contiguous axial images were obtained from the base of the skull through the vertex without intravenous contrast. COMPARISON:  Earlier same day FINDINGS: Brain: 3.9 x 3.0 x 3.0 cm hyperdense region in the right posterior basal ganglia most consistent with a parenchymal hematoma, given small internal fluid levels. Mild mass effect and surrounding edema. No midline shift. Chronic ischemic changes in the right posterior frontal region as seen previously. Vascular: No other new vascular finding. Skull: Negative Sinuses/Orbits: Opacified right maxillary and ethmoid sinuses. Orbits negative.  Other: None IMPRESSION: 1. 3.9 x 3.0 x 3.0 cm hyperdense region in the right posterior basal ganglia most consistent with parenchymal hematoma, given small internal fluid levels. Mild mass effect and surrounding edema. No midline shift. 2. Chronic ischemic changes in the right posterior frontal region as seen previously. 3. Opacified right maxillary and ethmoid sinuses. Electronically Signed   By: Nelson Chimes M.D.   On: 06/29/2020 18:48   CT Code Stroke CTA Neck W/WO contrast  Result Date: 06/29/2020 CLINICAL DATA:  Hyperdense vessel and stroke-like symptoms EXAM: CT ANGIOGRAPHY HEAD AND NECK CT PERFUSION BRAIN TECHNIQUE: Multidetector CT imaging of the head and neck was performed using the standard protocol during bolus administration of intravenous contrast. Multiplanar CT image reconstructions and MIPs were obtained to evaluate the vascular anatomy. Carotid stenosis measurements (when applicable) are obtained utilizing NASCET criteria, using the distal internal carotid diameter as the denominator. Multiphase CT imaging of the brain was performed following IV bolus contrast injection. Subsequent parametric perfusion maps were calculated using RAPID software. CONTRAST:  Dose is not yet known COMPARISON:  None. FINDINGS: CTA NECK FINDINGS Aortic arch: Mild atheromatous changes. Three vessel branching. No acute finding. Right carotid system: Atherosclerotic plaque about the bifurcation with occluded ICA bulb. No flow seen within the ICA in the neck. Left carotid system: Atheromatous plaque at the bifurcation and bulb. No stenosis or ulceration. Negative for beading. Vertebral arteries: Low-density plaque at the left subclavian origin. No flow limiting subclavian stenosis. Plaque at the left vertebral origin without stenosis, beading, or dissection. Skeleton: Focal advanced C5-6 disc degeneration with ridging causing biforaminal impingement. Ordinary facet osteoarthritis. Other neck: No acute or aggressive  finding. Right-sided chronic sinusitis with pattern middle meatus obstruction. Upper chest: Granulomatous nodal calcifications. There is also a calcified granuloma appearance in the left upper lobe. Review of the MIP images confirms the above findings CTA HEAD FINDINGS Anterior circulation: No flow seen  in the right carotid or MCA branches. There is distal M2 M3 branch reconstitution. Calcified plaque along the left carotid siphon. No MCA branch occlusion, aneurysm, or beading. Posterior circulation: The vertebral and basilar arteries are smooth and widely patent. High-grade atheromatous narrowings of the bilateral PCA, P3 segment on the right and upper first order branch on the left. Venous sinuses: Patent as permitted by contrast timing Anatomic variants: None significant Review of the MIP images confirms the above findings CT Brain Perfusion Findings: ASPECTS: 9 CBF (<30%) Volume: 69mL Perfusion (Tmax>6.0s) volume: 171mL Mismatch Volume: 152mL Infarction Location:Right basal ganglia and lesser extensive lateral frontal cortex infarct. Critical Value/emergent results were called by telephone at the time of interpretation on 06/29/2020 at 9:37 am to provider ERIC [facility] , who verbally acknowledged these results. IMPRESSION: 1. Emergent large vessel occlusion with no flow seen in the right internal carotid or proximal MCA. There is a 36 cc core infarct and 142 cc of penumbra by CT perfusion. 2. Atherosclerosis without flow limiting stenosis of the other major vessels. 3. Bilateral high-grade atheromatous PCA narrowings, more proximal on the right. 4. 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 Electronically Signed   By: Monte Fantasia M.D.   On: 06/29/2020 09:53   MR BRAIN WO CONTRAST  Result Date: 06/30/2020 CLINICAL DATA:  Stroke follow-up EXAM: MRI HEAD WITHOUT CONTRAST TECHNIQUE: Multiplanar, multiecho pulse sequences of the brain and surrounding structures were obtained  without intravenous contrast. COMPARISON:  Head CT and CTA from yesterday FINDINGS: Brain: Acute infarction at the right stratum and patchy along the insula and lateral frontal cortex. The area of infarction appears similar to the area of pre treatment core infarct. Hematoma centered at the right putamen with dimensions better assessed by prior CT, grossly similar at to 3.5 cm anterior to posterior. Correlating to fluid fluid level, there is a posterior dense clot appearance and more heterogeneous anterior component. On gradient imaging petechial hemorrhage is seen along the right MCA watershed. No hydrocephalus or shift. Remote high right frontal cortex infarct anteriorly. Vascular: No flow voids seen in the left ICA beginning in the upper cervical spine. There is normalization by the supraclinoid segment. Skull and upper cervical spine: No focal marrow lesion Sinuses/Orbits: Right middle meatus obstruction with frontal, ethmoid, and especially maxillary sinusitis on the right. IMPRESSION: 1. Acute infarct at the right basal ganglia with nonprogressive hemorrhage when correlated with prior CT. Less extensive patchy acute cortical infarct in the right MCA territory. The areas of acute infarction correlate well with pretreatment CTP. 2. 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 Electronically Signed   By: Monte Fantasia M.D.   On: 06/30/2020 04:22   CT Code Stroke Cerebral Perfusion with contrast  Result Date: 06/29/2020 CLINICAL DATA:  Hyperdense vessel and stroke-like symptoms EXAM: CT ANGIOGRAPHY HEAD AND NECK CT PERFUSION BRAIN TECHNIQUE: Multidetector CT imaging of the head and neck was performed using the standard protocol during bolus administration of intravenous contrast. Multiplanar CT image reconstructions and MIPs were obtained to evaluate the vascular anatomy. Carotid stenosis measurements (when applicable) are obtained utilizing NASCET criteria, using  the distal internal carotid diameter as the denominator. Multiphase CT imaging of the brain was performed following IV bolus contrast injection. Subsequent parametric perfusion maps were calculated using RAPID software. CONTRAST:  Dose is not yet known COMPARISON:  None. FINDINGS: CTA NECK FINDINGS Aortic arch: Mild atheromatous changes. Three vessel branching. No acute finding. Right carotid system: Atherosclerotic  plaque about the bifurcation with occluded ICA bulb. No flow seen within the ICA in the neck. Left carotid system: Atheromatous plaque at the bifurcation and bulb. No stenosis or ulceration. Negative for beading. Vertebral arteries: Low-density plaque at the left subclavian origin. No flow limiting subclavian stenosis. Plaque at the left vertebral origin without stenosis, beading, or dissection. Skeleton: Focal advanced C5-6 disc degeneration with ridging causing biforaminal impingement. Ordinary facet osteoarthritis. Other neck: No acute or aggressive finding. Right-sided chronic sinusitis with pattern middle meatus obstruction. Upper chest: Granulomatous nodal calcifications. There is also a calcified granuloma appearance in the left upper lobe. Review of the MIP images confirms the above findings CTA HEAD FINDINGS Anterior circulation: No flow seen in the right carotid or MCA branches. There is distal M2 M3 branch reconstitution. Calcified plaque along the left carotid siphon. No MCA branch occlusion, aneurysm, or beading. Posterior circulation: The vertebral and basilar arteries are smooth and widely patent. High-grade atheromatous narrowings of the bilateral PCA, P3 segment on the right and upper first order branch on the left. Venous sinuses: Patent as permitted by contrast timing Anatomic variants: None significant Review of the MIP images confirms the above findings CT Brain Perfusion Findings: ASPECTS: 9 CBF (<30%) Volume: 26mL Perfusion (Tmax>6.0s) volume: 151mL Mismatch Volume: 150mL  Infarction Location:Right basal ganglia and lesser extensive lateral frontal cortex infarct. Critical Value/emergent results were called by telephone at the time of interpretation on 06/29/2020 at 9:37 am to provider ERIC [facility] , who verbally acknowledged these results. IMPRESSION: 1. Emergent large vessel occlusion with no flow seen in the right internal carotid or proximal MCA. There is a 36 cc core infarct and 142 cc of penumbra by CT perfusion. 2. Atherosclerosis without flow limiting stenosis of the other major vessels. 3. Bilateral high-grade atheromatous PCA narrowings, more proximal on the right. 4. 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 Electronically Signed   By: Monte Fantasia M.D.   On: 06/29/2020 09:53   Portable Chest xray  Result Date: 06/30/2020 CLINICAL DATA:  Respiratory failure.  Stroke. EXAM: PORTABLE CHEST 1 VIEW COMPARISON:  06/29/2020 FINDINGS: 0538 hours. Endotracheal tube tip is 7.4 cm above the base of the carina. The lungs are clear without focal pneumonia, edema, pneumothorax or pleural effusion. The cardiopericardial silhouette is within normal limits for size. The visualized bony structures of the thorax show no acute abnormality. Telemetry leads overlie the chest. IMPRESSION: No acute cardiopulmonary findings. Electronically Signed   By: Misty Stanley M.D.   On: 06/30/2020 07:30   DG CHEST PORT 1 VIEW  Result Date: 06/29/2020 CLINICAL DATA:  Code stroke with coiling intubated EXAM: PORTABLE CHEST 1 VIEW COMPARISON:  None. FINDINGS: Endotracheal tube tip is about 4.2 cm superior to the carina. Esophageal tube tip is below the diaphragm but incompletely visualized. No focal opacity or pleural effusion. Normal cardiac size. No pneumothorax. IMPRESSION: Endotracheal tube tip about 4.2 cm superior to the carina. Lungs grossly clear Electronically Signed   By: Donavan Foil M.D.   On: 06/29/2020 16:00   ECHOCARDIOGRAM COMPLETE  Result Date:  06/30/2020    ECHOCARDIOGRAM REPORT   Patient Name:   Tyler Richards Date of Exam: 06/30/2020 Medical Rec #:  [id number]     Height:       74.0 in Accession #:    [id number]    Weight:       187.6 lb Date of Birth:  Dec 04, 1950     BSA:  2.115 m Patient Age:    40 years      BP:           162/65 mmHg Patient Gender: M             HR:           52 bpm. Exam Location:  Inpatient Procedure: 2D Echo, Cardiac Doppler, Color Doppler and Intracardiac            Opacification Agent Indications:    CVA  History:        Patient has no prior history of Echocardiogram examinations.                 Stroke and COPD, Signs/Symptoms:Resp. failure; Risk                 Factors:Hypertension.  Sonographer:    Dustin Flock Referring Phys: [phone] ERIC LINDZEN  Sonographer Comments: Technically difficult study due to poor echo windows and echo performed with patient supine and on artificial respirator. Image acquisition challenging due to COPD and Image acquisition challenging due to respiratory motion. IMPRESSIONS  1. Left ventricular ejection fraction, by estimation, is 50 to 55%. The left ventricle has low normal function. The left ventricle has no regional wall motion abnormalities. Left ventricular diastolic parameters are consistent with Grade I diastolic dysfunction (impaired relaxation).  2. Right ventricular systolic function is normal. The right ventricular size is normal. There is mildly elevated pulmonary artery systolic pressure.  3. Left atrial size was mildly dilated.  4. The mitral valve is normal in structure. No evidence of mitral valve regurgitation. No evidence of mitral stenosis.  5. The aortic valve is normal in structure. Aortic valve regurgitation is not visualized. No aortic stenosis is present.  6. The inferior vena cava is dilated in size with <50% respiratory variability, suggesting right atrial pressure of 15 mmHg. FINDINGS  Left Ventricle: Left ventricular ejection fraction, by estimation, is 50 to  55%. The left ventricle has low normal function. The left ventricle has no regional wall motion abnormalities. Definity contrast agent was given IV to delineate the left ventricular endocardial borders. The left ventricular internal cavity size was normal in size. There is no left ventricular hypertrophy. Left ventricular diastolic parameters are consistent with Grade I diastolic dysfunction (impaired relaxation). Indeterminate filling pressures. Right Ventricle: The right ventricular size is normal. No increase in right ventricular wall thickness. Right ventricular systolic function is normal. There is mildly elevated pulmonary artery systolic pressure. The tricuspid regurgitant velocity is 2.43  m/s, and with an assumed right atrial pressure of 15 mmHg, the estimated right ventricular systolic pressure is 93.2 mmHg. Left Atrium: Left atrial size was mildly dilated. Right Atrium: Right atrial size was normal in size. Pericardium: There is no evidence of pericardial effusion. Mitral Valve: The mitral valve is normal in structure. No evidence of mitral valve regurgitation. No evidence of mitral valve stenosis. Tricuspid Valve: The tricuspid valve is normal in structure. Tricuspid valve regurgitation is trivial. No evidence of tricuspid stenosis. Aortic Valve: The aortic valve is normal in structure. Aortic valve regurgitation is not visualized. No aortic stenosis is present. Pulmonic Valve: The pulmonic valve was normal in structure. Pulmonic valve regurgitation is not visualized. No evidence of pulmonic stenosis. Aorta: The aortic root is normal in size and structure. Venous: The inferior vena cava is dilated in size with less than 50% respiratory variability, suggesting right atrial pressure of 15 mmHg. IAS/Shunts: No atrial level shunt detected by color flow  Doppler.  LEFT VENTRICLE PLAX 2D LVIDd:         5.30 cm  Diastology LVIDs:         3.80 cm  LV e' medial:    6.85 cm/s LV PW:         1.30 cm  LV E/e' medial:   10.2 LV IVS:        1.00 cm  LV e' lateral:   12.10 cm/s LVOT diam:     2.10 cm  LV E/e' lateral: 5.8 LV SV:         106 LV SV Index:   50 LVOT Area:     3.46 cm  RIGHT VENTRICLE RV Basal diam:  3.80 cm RV S prime:     7.07 cm/s TAPSE (M-mode): 3.3 cm LEFT ATRIUM           Index       RIGHT ATRIUM           Index LA diam:      4.30 cm 2.03 cm/m  RA Area:     21.00 cm LA Vol (A4C): 45.5 ml 21.51 ml/m RA Volume:   67.30 ml  31.82 ml/m  AORTIC VALVE LVOT Vmax:   150.00 cm/s LVOT Vmean:  90.400 cm/s LVOT VTI:    0.305 m  AORTA Ao Root diam: 3.50 cm MITRAL VALVE               TRICUSPID VALVE MV Area (PHT): 2.80 cm    TR Peak grad:   23.6 mmHg MV Decel Time: 271 msec    TR Vmax:        243.00 cm/s MV E velocity: 69.80 cm/s MV A velocity: 68.60 cm/s  SHUNTS MV E/A ratio:  1.02        Systemic VTI:  0.30 m                            Systemic Diam: 2.10 cm Skeet Latch MD Electronically signed by Skeet Latch MD Signature Date/Time: 06/30/2020/11:25:18 AM    Final    CT HEAD CODE STROKE WO CONTRAST  Result Date: 06/29/2020 CLINICAL DATA:  Code stroke.  Slurred speech and left facial droop EXAM: CT HEAD WITHOUT CONTRAST TECHNIQUE: Contiguous axial images were obtained from the base of the skull through the vertex without intravenous contrast. COMPARISON:  None. FINDINGS: Brain: Small remote appearing cortically based infarcts in the superior right frontal lobe. No hemorrhage, hydrocephalus, or masslike finding. Vascular: Hyperdense distal right ICA to MCA branches. Atherosclerotic calcification. Skull: Negative Sinuses/Orbits: Right maxillary, ethmoid, and frontal sinus opacification with sclerotic wall thickening at the maxillary sinus. Other: Critical Value/emergent results were called by telephone at the time of interpretation on 06/29/2020 at 9:23 am to provider Lindzen , who verbally acknowledged these results. ASPECTS [facility] Stroke Program Early CT Score) - Ganglionic level infarction (caudate,  lentiform nuclei, internal capsule, insula, M1-M3 cortex): Posterior putamen appears blunted compared to the left. Equivocal for small insular cortex infarct. - Supraganglionic infarction (M4-M6 cortex): 3, when accounting for chronic infarct Total score (0-10 with 10 being normal): 9, when accounting for chronic infarct IMPRESSION: 1. Hyperdense distal right ICA and proximal MCA. 2. Blunted appearance of the posterior right putamen. Chronic right high frontal cortex infarcts. ASPECTS is 9 when excluding the chronic changes. Electronically Signed   By: Monte Fantasia M.D.   On: 06/29/2020 09:25   VAS US CAROTID  Result Date: 06/30/2020  Carotid Arterial Duplex Study Indications:   CVA and Right stent. Other Factors: Rt ica stent- 06/29/20. Performing Technologist: [name] RVS  Examination Guidelines: A complete evaluation includes B-mode imaging, spectral Doppler, color Doppler, and power Doppler as needed of all accessible portions of each vessel. Bilateral testing is considered an integral part of a complete examination. Limited examinations for reoccurring indications may be performed as noted.  Right Carotid Findings: +----------+--------+--------+--------+------------------+--------+           PSV cm/sEDV cm/sStenosisPlaque DescriptionComments +----------+--------+--------+--------+------------------+--------+ CCA Prox  101                     heterogenous               +----------+--------+--------+--------+------------------+--------+ CCA Distal57                      heterogenous               +----------+--------+--------+--------+------------------+--------+ ECA       103     6                                          +----------+--------+--------+--------+------------------+--------+ +----------+--------+-------+--------+-------------------+           PSV cm/sEDV cmsDescribeArm Pressure (mmHG) +----------+--------+-------+--------+-------------------+  Subclavian160                                        +----------+--------+-------+--------+-------------------+ +---------+--------+--+--------+--+---------+ VertebralPSV cm/s63EDV cm/s12Antegrade +---------+--------+--+--------+--+---------+  Right Stent(s): +---------------+--+-+--------+++ Prox to Stent  274         +---------------+--+-+--------+++ Proximal Stent 49          +---------------+--+-+--------+++ Mid Stent         occluded +---------------+--+-+--------+++ Distal Stent      occluded +---------------+--+-+--------+++ Distal to Stent   occluded +---------------+--+-+--------+++   Left Carotid Findings: +----------+--------+--------+--------+------------------+--------+           PSV cm/sEDV cm/sStenosisPlaque DescriptionComments +----------+--------+--------+--------+------------------+--------+ CCA Prox  133     18              heterogenous               +----------+--------+--------+--------+------------------+--------+ CCA Distal95      17              heterogenous               +----------+--------+--------+--------+------------------+--------+ ICA Prox  97      22      1-39%   heterogenous               +----------+--------+--------+--------+------------------+--------+ ICA Distal108     36                                         +----------+--------+--------+--------+------------------+--------+ ECA       120     10                                         +----------+--------+--------+--------+------------------+--------+ +----------+--------+--------+--------+-------------------+           PSV cm/sEDV cm/sDescribeArm Pressure (mmHG) +----------+--------+--------+--------+-------------------+ [id number]                                         +----------+--------+--------+--------+-------------------+ +---------+--------+--+--------+--+---------+  VertebralPSV cm/s48EDV cm/s13Antegrade  +---------+--------+--+--------+--+---------+   Summary: Right Carotid: ICA stent appears occluded. Left Carotid: Velocities in the left ICA are consistent with a 1-39% stenosis. Vertebrals: Bilateral vertebral arteries demonstrate antegrade flow. *See table(s) above for measurements and observations.     Preliminary    VAS [place] LOWER EXTREMITY VENOUS (DVT)  Result Date: 06/30/2020  Lower Venous DVTStudy Indications: Stroke.  Comparison Study: no prior Performing Technologist: [name] RVS  Examination Guidelines: A complete evaluation includes B-mode imaging, spectral Doppler, color Doppler, and power Doppler as needed of all accessible portions of each vessel. Bilateral testing is considered an integral part of a complete examination. Limited examinations for reoccurring indications may be performed as noted. The reflux portion of the exam is performed with the patient in reverse Trendelenburg.  +---------+---------------+---------+-----------+----------+--------------+ RIGHT    CompressibilityPhasicitySpontaneityPropertiesThrombus Aging +---------+---------------+---------+-----------+----------+--------------+ CFV      Full           Yes      Yes                                 +---------+---------------+---------+-----------+----------+--------------+ SFJ      Full                                                        +---------+---------------+---------+-----------+----------+--------------+ FV Prox  Full                                                        +---------+---------------+---------+-----------+----------+--------------+ FV Mid   Full                                                        +---------+---------------+---------+-----------+----------+--------------+ FV DistalFull                                                        +---------+---------------+---------+-----------+----------+--------------+ PFV      Full                                                         +---------+---------------+---------+-----------+----------+--------------+ POP      Full           Yes      Yes                                 +---------+---------------+---------+-----------+----------+--------------+ PTV      Full                                                        +---------+---------------+---------+-----------+----------+--------------+  PERO     Full                                                        +---------+---------------+---------+-----------+----------+--------------+   +---------+---------------+---------+-----------+----------+--------------+ LEFT     CompressibilityPhasicitySpontaneityPropertiesThrombus Aging +---------+---------------+---------+-----------+----------+--------------+ CFV      Full           Yes      Yes                                 +---------+---------------+---------+-----------+----------+--------------+ SFJ      Full                                                        +---------+---------------+---------+-----------+----------+--------------+ FV Prox  Full                                                        +---------+---------------+---------+-----------+----------+--------------+ FV Mid   Full                                                        +---------+---------------+---------+-----------+----------+--------------+ FV DistalFull                                                        +---------+---------------+---------+-----------+----------+--------------+ PFV      Full                                                        +---------+---------------+---------+-----------+----------+--------------+ POP      Full           Yes      Yes                                 +---------+---------------+---------+-----------+----------+--------------+ PTV      Full                                                         +---------+---------------+---------+-----------+----------+--------------+     Summary: BILATERAL: - No evidence of deep vein thrombosis seen in the lower extremities, bilaterally. - No evidence of superficial venous thrombosis in the lower extremities, bilaterally. -   *See table(s) above for measurements and observations.  Preliminary     EKG: shows NSR at 45 bpm with narrow QRS at 92 ms and PR interval of 156 ms. (personally reviewed)  TELEMETRY: Review of telemetry shows mostly NSR in 70-80s, occasional sinus bradycardia. Episodes of short, significant bradycardia appear sinus slowing (R-R prolongation without change in the P-R interval, less suggestive of hypervagatonia) Very occasionally a dropped beat or blocked PAC is noted (personally reviewed)  Assessment/Plan: 1.  Bradycardia Bradycardia at baseline, but conduction stable.  Not followed in systems so no EKGs. Notes from 2016 show HRs in 50s. He has been asymptomatic and is currently off dopamine. BP stable.  Will discuss further with MD. If remains hemodynamically stable may be able to continue to observe conservatively while he continues to rehab. He remains ill, requiring NE for BP management, which preceded his bradycardia and does not appear to be worsened by it.  Could consider monitoring.  His conduction may improve as he gets further out from this acute illness.  Avoid AV nodal blockade  2. 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 LE dopplers negative.   3. HTN Has been running low post CVA.  NE currently running at 7.  Per neurology at this time.   Will discuss further with MD. Pt remains NPO as part of CVA care.   For questions or updates, please contact [place] Please consult [email or web address] for contact info under Cardiology/STEMI.  Signed, Shirley Friar, PA-C  07/01/2020 9:42  AM   ----------------------------------------------------------------------------------------------------------------------------------- I have seen, examined the patient, and reviewed the above assessment and plan.    Mr. Jeanell Sparrow is a 69 year old man admitted with a right MCA infarct due to right ICA and MCA occlusions.  He has been bradycardic during the hospitalization due to sinus bradycardia.  There is not appear to be any AV nodal conduction issues but rather vagally mediated sinus bradycardia.  For now would avoid AV nodal blockers.  For now, do not think a permanent pacemaker implant would improve his clinical status.  We will plan to follow him closely as an outpatient.  Vickie Epley, MD 07/01/2020 6:41 PM

## 2020-07-01 NOTE — Progress Notes (Signed)
Occupational Therapy Treatment Patient Details Name: Tyler Richards MRN: [id number] DOB: 1951/06/20 Today's Date: 07/01/2020    History of present illness Patient is a 69 y/o male who presents with left sided weakness, right gaze and slurred speech. NIH:17. Head CT- dense Right MCA infarct. CTA- Right ICA and MCA occlusions. s/p right MCA thrombectomy and right ICA stent placement 06/29/20 with post procedure hemmorhage. No PMH.   OT comments  Pt pleasant and willing to participate in therapy session. He continues to require max cues to track and attend to L visual field even with OT placed to [name] L side throughout session. Pt also easily distracted requiring cues to attend to task at hand. He continues to present with increased flexor tone in LUE with gentle passive stretching and ROM completed today. He may benefit from LUE splint to promote/maintain functional position - will continue to assess. VSS throughout. Will continue per POC at this time.    Follow Up Recommendations  CIR    Equipment Recommendations  3 in 1 bedside commode;Wheelchair (measurements OT);Wheelchair cushion (measurements OT) (TBD)          Precautions / Restrictions Precautions Precautions: Fall;Other (comment) Precaution Comments: watch HR, becomes bradycardic Restrictions Weight Bearing Restrictions: No Other Position/Activity Restrictions: n/a       Mobility Bed Mobility Overal bed mobility: Needs Assistance             General bed mobility comments: maxA for repositioning at bed level          ADL either performed or assessed with clinical judgement   ADL Overall ADL's : Needs assistance/impaired     Grooming: Min guard;Bed level;Wash/dry face Grooming Details (indicate cue type and reason): max cues to use RUE to perform task - encouragemed pt to perform on his own as he initailly asked spouse to assist                                      Vision   Additional  Comments: max cues to look towards L visual field. pt able to identify number of digits held up by OT only slightly past midline towards the L   Perception     Praxis      Cognition Arousal/Alertness: Awake/alert Behavior During Therapy: WFL for tasks assessed/performed Overall Cognitive Status: Impaired/Different from baseline Area of Impairment: Safety/judgement;Attention;Following commands;Awareness;Problem solving                   Current Attention Level: Sustained   Following Commands: Follows one step commands with increased time Safety/Judgement: Decreased awareness of safety;Decreased awareness of deficits (L sided neglect) Awareness: Intellectual Problem Solving: Slow processing;Difficulty sequencing;Decreased initiation;Requires verbal cues;Requires tactile cues General Comments: easily distractible, looking towards wife and asking her questions unrelated to session and forgetting to listen to OT instrcution (OT placed at [name] Left)        Exercises Exercises: General Upper Extremity;Other exercises;Hand exercises General Exercises - Upper Extremity Elbow Flexion: PROM;Left;10 reps Elbow Extension: PROM;Left;10 reps (slow stretch given tone) Wrist Flexion: PROM;Left;10 reps Wrist Extension: PROM;Left;10 reps Digit Composite Flexion: PROM;Left;10 reps Composite Extension: PROM;Left;10 reps Hand Exercises Forearm Supination: PROM;Left;5 reps Forearm Pronation: PROM;Left;5 reps Other Exercises Other Exercises: edcucated spouse in gentle PROM to L wrist/digits Other Exercises: scapular mobility including protraction/retraction, elevation/depression   Shoulder Instructions       General Comments      Pertinent Vitals/ Pain  Pain Assessment: No/denies pain  Home Living                                          Prior Functioning/Environment              Frequency  Min 2X/week        Progress Toward Goals  OT  Goals(current goals can now be found in the care plan section)  Progress towards OT goals: Progressing toward goals  Acute Rehab OT Goals Patient Stated Goal: to return to PLOF OT Goal Formulation: With patient Time For Goal Achievement: 07/14/20 Potential to Achieve Goals: Good ADL Goals Pt Will Perform Grooming: with min assist;sitting Pt Will Perform Lower Body Bathing: with mod assist;sitting/lateral leans;sit to/from stand Pt Will Perform Upper Body Dressing: with min assist;sitting Pt Will Perform Lower Body Dressing: with mod assist;sit to/from stand;sitting/lateral leans Pt Will Transfer to Toilet: with mod assist;stand pivot transfer;bedside commode Pt Will Perform Toileting - Clothing Manipulation and hygiene: with mod assist;sit to/from stand Pt/caregiver will Perform Home Exercise Program: Increased strength;Increased ROM;Left upper extremity;With minimal assist;With written HEP provided Additional ADL Goal #1: Pt will attend to L visual field/L side of body during ADL task with no more than min cues. Additional ADL Goal #2: Pt will maintain static sitting balance >5 min at supervision level as precursor to ADL.  Plan Discharge plan remains appropriate    Co-evaluation                 AM-PAC OT "6 Clicks" Daily Activity     Outcome Measure   Help from another person eating meals?: A Lot Help from another person taking care of personal grooming?: A Lot Help from another person toileting, which includes using toliet, bedpan, or urinal?: Total Help from another person bathing (including washing, rinsing, drying)?: A Lot Help from another person to put on and taking off regular upper body clothing?: A Lot Help from another person to put on and taking off regular lower body clothing?: Total 6 Click Score: 10    End of Session    OT Visit Diagnosis: Other abnormalities of gait and mobility (R26.89);Hemiplegia and hemiparesis;Other symptoms and signs involving  cognitive function;Other symptoms and signs involving the nervous system (R29.898) Hemiplegia - Right/Left: Left Hemiplegia - dominant/non-dominant: Non-Dominant Hemiplegia - caused by: Cerebral infarction   Activity Tolerance Patient tolerated treatment well   Patient Left in bed;with call bell/phone within reach;with nursing/sitter in room;with family/visitor present   Nurse Communication Mobility status        Time: 8527-7824 OT Time Calculation (min): 15 min  Charges: OT General Charges $OT Visit: 1 Visit OT Treatments $Therapeutic Activity: 8-22 mins  Lou Cal, OT Acute Rehabilitation Services Pager [phone] Office [place] 07/01/2020, 5:38 PM

## 2020-07-01 NOTE — Progress Notes (Addendum)
  EKG and tele reviewed.  Pt continues to have intermittent sinus slowing with one pause of 3.5+ seconds.   Discussed with Dr. Quentin Ore.  Continue to follow.   Tyler Richards [address]" [place], PA-C  07/01/2020 3:55 PM

## 2020-07-01 NOTE — Evaluation (Signed)
Speech Language Pathology Evaluation Patient Details Name: Tyler Richards MRN: [id number] DOB: Mar 20, 1951 Today's Date: 07/01/2020 Time: 8413-2440 SLP Time Calculation (min) (ACUTE ONLY): 16 min  Problem List:  Patient Active Problem List   Diagnosis Date Noted  . Arterial ischemic stroke, MCA, right, acute ([place]) 06/29/2020  . Stroke (cerebrum) ([place]) 06/29/2020  . Middle cerebral artery embolism, right 06/29/2020  . Acute encephalopathy   . Hypertension   . Acute respiratory failure with hypoxia [facility])    Past Medical History: History reviewed. No pertinent past medical history. Past Surgical History:  Past Surgical History:  Procedure Laterality Date  . RADIOLOGY WITH ANESTHESIA N/A 06/29/2020   Procedure: IR WITH ANESTHESIA;  Surgeon: Radiologist, Medication, MD;  Location: [facility];  Service: Radiology;  Laterality: N/A;   HPI:  Patient is a 69 y/o male who presents with left sided weakness, right gaze and slurred speech. NIH:17. Head CT- dense Right MCA infarct. CTA- Right ICA and MCA occlusions. s/p right MCA thrombectomy and right ICA stent placement 06/29/20 with post procedure hemmorhage. ETT 10/4-10/5. No PMH.   Assessment / Plan / Recommendation Clinical Impression  Pt has L-sided inattention, but can bring his awareness past midline when cued to do so. With these cues he can attend to the L side of his body, using his R side, and he acknowledges being weak on that side. He demonstrates less emergent awareness and ability to self-monitor/correct during functional tasks. He had reduced short-term recall, but showing improved retrieval with Min cues. His speech is mildly altered in terms of articulation, but he is intelligible at the conversational level and language appears to be fluent. Min cues and extra time are needed for completion of one-step commands. He would benefit from SLP acutely and at [facility] level f/u to maximize safety and independence.     SLP Assessment  SLP  Recommendation/Assessment: Patient needs continued Speech Lanaguage Pathology Services SLP Visit Diagnosis: Cognitive communication deficit (R41.841)    Follow Up Recommendations  Inpatient Rehab    Frequency and Duration min 2x/week  2 weeks      SLP Evaluation Cognition  Overall Cognitive Status: Impaired/Different from baseline Arousal/Alertness: Awake/alert Orientation Level: Oriented X4 Attention: Sustained Sustained Attention: Appears intact (during brief intervals of time) Memory: Impaired Memory Impairment: Retrieval deficit;Decreased short term memory Decreased Short Term Memory: Verbal basic (recalled 2/4 words independently; 4/4 with cues) Awareness: Impaired Awareness Impairment: Emergent impairment Problem Solving: Impaired Problem Solving Impairment: Functional basic;Functional complex Executive Function: Self Monitoring;Self Correcting Self Monitoring: Impaired Self Monitoring Impairment: Functional basic Self Correcting: Impaired Self Correcting Impairment: Functional basic Safety/Judgment: Impaired Comments: L inattention       Comprehension  Auditory Comprehension Overall Auditory Comprehension: Impaired Commands: Impaired One Step Basic Commands: 75-100% accurate EffectiveTechniques: Extra processing time;Visual/Gestural cues    Expression Expression Primary Mode of Expression: Verbal Verbal Expression Overall Verbal Expression: Appears within functional limits for tasks assessed   Oral / Motor  Oral Motor/Sensory Function Overall Oral Motor/Sensory Function: Moderate impairment Facial ROM: Reduced left;Suspected CN VII (facial) dysfunction Facial Symmetry: Abnormal symmetry left;Suspected CN VII (facial) dysfunction Facial Strength: Reduced left;Suspected CN VII (facial) dysfunction Lingual ROM: Reduced left;Suspected CN XII (hypoglossal) dysfunction Lingual Symmetry: Within Functional Limits Lingual Strength: Reduced;Suspected CN XII  (hypoglossal) dysfunction Lingual Sensation: Reduced;Suspected CN VII (facial) dysfunction-anterior 2/3 tongue Velum: Within Functional Limits Motor Speech Overall Motor Speech: Impaired Respiration: Within functional limits Phonation: Normal Resonance: Within functional limits Articulation: Impaired Level of Impairment: Conversation Intelligibility: Intelligible   GO  [name], M.A. [place] [facility] Pager [phone] Office [phone]  07/01/2020, 11:20 AM

## 2020-07-02 ENCOUNTER — Inpatient Hospital Stay (HOSPITAL_COMMUNITY): Payer: Medicare Other

## 2020-07-02 DIAGNOSIS — I951 Orthostatic hypotension: Secondary | ICD-10-CM

## 2020-07-02 DIAGNOSIS — R569 Unspecified convulsions: Secondary | ICD-10-CM

## 2020-07-02 DIAGNOSIS — I6521 Occlusion and stenosis of right carotid artery: Secondary | ICD-10-CM

## 2020-07-02 LAB — TRIGLYCERIDES: Triglycerides: 73 mg/dL (ref <150)

## 2020-07-02 LAB — BASIC METABOLIC PANEL
Anion gap: 9 (ref 5–15)
BUN: 11 mg/dL (ref 8–23)
CO2: 22 mmol/L (ref 22–32)
Calcium: 8.4 mg/dL — ABNORMAL LOW (ref 8.9–10.3)
Chloride: 104 mmol/L (ref 98–111)
Creatinine, Ser: 0.79 mg/dL (ref 0.61–1.24)
GFR calc non Af Amer: >60 mL/min (ref >60)
Glucose, Bld: 103 mg/dL — ABNORMAL HIGH (ref 70–99)
Potassium: 3.4 mmol/L — ABNORMAL LOW (ref 3.5–5.1)
Sodium: 135 mmol/L (ref 135–145)

## 2020-07-02 LAB — CBC
HCT: 30.1 % — ABNORMAL LOW (ref 39.0–52.0)
Hemoglobin: 10.1 g/dL — ABNORMAL LOW (ref 13.0–17.0)
MCH: 32.3 pg (ref 26.0–34.0)
MCHC: 33.6 g/dL (ref 30.0–36.0)
MCV: 96.2 fL (ref 80.0–100.0)
Platelets: 287 10*3/uL (ref 150–400)
RBC: 3.13 MIL/uL — ABNORMAL LOW (ref 4.22–5.81)
RDW: 12.5 % (ref 11.5–15.5)
WBC: 10.5 10*3/uL (ref 4.0–10.5)
nRBC: 0 % (ref 0.0–0.2)

## 2020-07-02 MED ORDER — SODIUM CHLORIDE 0.9 % IV SOLN
250.0000 mL | INTRAVENOUS | Status: DC
  Administered 2020-07-02: 250 mL via INTRAVENOUS

## 2020-07-02 MED ORDER — MIDODRINE HCL 5 MG PO TABS
10.0000 mg | ORAL_TABLET | Freq: Three times a day (TID) | ORAL | Status: DC
  Administered 2020-07-02 – 2020-07-06 (×11): 10 mg via ORAL
  Filled 2020-07-02 (×12): qty 2

## 2020-07-02 MED ORDER — LACTATED RINGERS IV BOLUS
500.0000 mL | Freq: Once | INTRAVENOUS | Status: AC
  Administered 2020-07-02: 500 mL via INTRAVENOUS

## 2020-07-02 MED ORDER — POTASSIUM CHLORIDE 10 MEQ/100ML IV SOLN
10.0000 meq | INTRAVENOUS | Status: DC
  Administered 2020-07-02: 10 meq via INTRAVENOUS
  Filled 2020-07-02: qty 100

## 2020-07-02 MED ORDER — SODIUM CHLORIDE 0.9 % IV SOLN
250.0000 mL | INTRAVENOUS | Status: DC
  Administered 2020-07-03 – 2020-07-08 (×4): 250 mL via INTRAVENOUS

## 2020-07-02 MED ORDER — POTASSIUM CHLORIDE 20 MEQ PO PACK
40.0000 meq | PACK | ORAL | Status: AC
  Administered 2020-07-02 (×2): 40 meq via ORAL
  Filled 2020-07-02 (×2): qty 2

## 2020-07-02 MED ORDER — CHLORHEXIDINE GLUCONATE 0.12 % MT SOLN
15.0000 mL | Freq: Two times a day (BID) | OROMUCOSAL | Status: DC
  Administered 2020-07-02 – 2020-07-09 (×12): 15 mL via OROMUCOSAL
  Filled 2020-07-02 (×9): qty 15

## 2020-07-02 MED ORDER — ORAL CARE MOUTH RINSE
15.0000 mL | Freq: Two times a day (BID) | OROMUCOSAL | Status: DC
  Administered 2020-07-02 – 2020-07-09 (×13): 15 mL via OROMUCOSAL

## 2020-07-02 NOTE — Progress Notes (Signed)
Called and alerted Elink CCM MD Ogan that patient has had multiple bright red bowel movements.  Will await orders and continue to observe and act accordingly.

## 2020-07-02 NOTE — Progress Notes (Signed)
Inpatient Rehabilitation Admissions Coordinator  Noted Levophed restarted. I await medical readiness to admit to Cir. I will follow.  Danne Baxter, RN, MSN Rehab Admissions Coordinator [phone] 07/02/2020 1:12 PM

## 2020-07-02 NOTE — Progress Notes (Signed)
Patient complaining about inability to urinate.  Bladder scan showed 521mLs.  Will in and out catheter and keep observing.

## 2020-07-02 NOTE — Progress Notes (Signed)
EEG complete - results pending 

## 2020-07-02 NOTE — Procedures (Signed)
Patient Name: Tyler Richards  MRN: [id number]  Epilepsy Attending: Lora Havens  Referring Physician/Provider: Dr Rosalin Hawking Date: 07/02/2020 Duration: 24.26 mins  Patient history: 69 y.o. male with history of gout admitted for left-sided weakness, right gaze, left facial droop with fall. MRI showed acute infarct at the right basal ganglia with nonprogressive hemorrhage as well as less extensive patchy acute cortical infarct in the right MCA territory EEG to evaluate for seizure.  Level of alertness: Awake, asleep  AEDs during EEG study: None  Technical aspects: This EEG study was done with scalp electrodes positioned according to the 10-20 International system of electrode placement. Electrical activity was acquired at a sampling rate of 500Hz  and reviewed with a high frequency filter of 70Hz  and a low frequency filter of 1Hz . EEG data were recorded continuously and digitally stored.   Description: The posterior dominant rhythm consists of 8 Hz activity of moderate voltage (25-35 uV) seen predominantly in posterior head regions, asymmetric ( R<L) and reactive to eye opening and eye closing. Sleep was characterized by vertex waves, sleep spindles (12 to 14 Hz), maximal frontocentral region.  EEG showed continuous 3 to 5 Hz theta-delta slowing in right hemisphere.  Hyperventilation and photic stimulation were not performed.     ABNORMALITY -Continuous slow, right hemisphere - Background asymmetry, right<left  IMPRESSION: This study is suggestive of cortical dysfunction in right hemisphere consistent with underlying stroke. No seizures or epileptiform discharges were seen throughout the recording.  Yashika Mask Barbra Sarks

## 2020-07-02 NOTE — Progress Notes (Signed)
  Speech Language Pathology Treatment: Dysphagia  Patient Details Name: Tyler Richards MRN: [id number] DOB: 04-01-1951 Today's Date: 07/02/2020 Time: 0815-0825 SLP Time Calculation (min) (ACUTE ONLY): 10 min  Assessment / Plan / Recommendation Clinical Impression  Upon SLP arrival pt was being fed breakfast by his wife, who believes that he is swallowing really well with current diet today (no overt choking). SLP set up tray so that pt could feed himself, still needing cues to find items on the L side of his tray. He has mild-moderate L buccal pocketing that he can address with his tongue, but he has more difficulty using his tongue to remove L anterior loss of purees. With Mod cues for self-maintenance, he is most successful using a napkin to wipe it away. Other than a delayed throat clear x1 there are no overt signs of potentially decreased airway protection, and he did have good protection during MBS. Education was reinforced about current diet/precautions as well as strategies and attention to the L. We discussed the importance of helping him bring himself to his L as much as possible (note that wife is typically seated to his R). Will continue to follow. He would benefit from CIR-level follow up.   HPI HPI: Patient is a 69 y/o male who presents with left sided weakness, right gaze and slurred speech. NIH:17. Head CT- dense Right MCA infarct. CTA- Right ICA and MCA occlusions. s/p right MCA thrombectomy and right ICA stent placement 06/29/20 with post procedure hemmorhage. ETT 10/4-10/5. No PMH.      SLP Plan  Continue with current plan of care       Recommendations  Diet recommendations: Dysphagia 1 (puree);Thin liquid Liquids provided via: Straw Medication Administration: Crushed with puree Supervision: Staff to assist with self feeding;Full supervision/cueing for compensatory strategies Compensations: Minimize environmental distractions;Slow rate;Small sips/bites;Lingual sweep for  clearance of pocketing Postural Changes and/or Swallow Maneuvers: Seated upright 90 degrees                Oral Care Recommendations: Oral care BID Follow up Recommendations: Inpatient Rehab SLP Visit Diagnosis: Dysphagia, oropharyngeal phase (R13.12) Plan: Continue with current plan of care       GO                [name], M.A. [place] [facility] Pager [phone] Office [phone]  07/02/2020, 8:33 AM

## 2020-07-02 NOTE — Progress Notes (Signed)
STROKE TEAM PROGRESS NOTE   SUBJECTIVE (INTERVAL HISTORY) Wife and RN at bedside.  Patient lying in bed, taking water and apple sauce for meds. Still has right gaze preference, left HH, left hemiplegia. Pending CIR. On ASA and brilinta. Off levophed on midodrine 10mg  tid.  Shortly after lifting up to chair with PT/OT, he had episode of LOC, with some snoring sound. BP 80s at that time, put him flat postioning, he was able to gain consciousness in a couple of mins. Put back in bed, resume IVF and levophed. Wife updated. Will check EEG also. Now BP 120s.    OBJECTIVE Temp:  [98.3 F (36.8 C)-100.9 F (38.3 C)] 99.1 F (37.3 C) (10/07 0800) Pulse Rate:  [36-96] 71 (10/07 1030) Cardiac Rhythm: Normal sinus rhythm (10/07 0400) Resp:  [14-32] 18 (10/07 1030) BP: (75-159)/(41-133) 111/56 (10/07 1030) SpO2:  [96 %-100 %] 99 % (10/07 1030)  Recent Labs  Lab 06/29/20 0904  GLUCAP 88   Recent Labs  Lab 06/29/20 0904 06/29/20 0904 06/29/20 0915 06/29/20 1738 06/30/20 0043 07/01/20 0433 07/02/20 0424  NA 136   < > 138 138 138 138 135  K 5.0   < > 4.5 3.8 3.9 3.6 3.4*  CL 103  --  105  --  106 106 104  CO2 20*  --   --   --  20* 21* 22  GLUCOSE 104*  --  105*  --  113* 107* 103*  BUN 14  --  17  --  10 8 11   CREATININE 0.87  --  0.70  --  0.89 0.85 0.79  CALCIUM 9.1   < >  --   --  8.4* 8.5* 8.4*   < > = values in this interval not displayed.   Recent Labs  Lab 06/29/20 0904  AST 36  ALT 17  ALKPHOS 61  BILITOT 1.8*  PROT 6.5  ALBUMIN 3.3*   Recent Labs  Lab 06/29/20 0904 06/29/20 0904 06/29/20 0915 06/29/20 1738 06/30/20 0043 07/01/20 0433 07/02/20 0424  WBC 9.0  --   --   --  9.7 11.2* 10.5  NEUTROABS 6.8  --   --   --  7.2  --   --   HGB 13.9   < > 14.3 11.9* 12.2* 11.7* 10.1*  HCT 41.2   < > 42.0 35.0* 36.2* 34.2* 30.1*  MCV 96.5  --   --   --  97.1 99.1 96.2  PLT 320  --   --   --  380 329 287   < > = values in this interval not displayed.   No results for  input(s): CKTOTAL, CKMB, CKMBINDEX, TROPONINI in the last 168 hours. No results for input(s): LABPROT, INR in the last 72 hours. Recent Labs    06/29/20 1749  COLORURINE YELLOW  LABSPEC 1.044*  PHURINE 5.0  GLUCOSEU NEGATIVE  HGBUR MODERATE*  BILIRUBINUR NEGATIVE  KETONESUR NEGATIVE  PROTEINUR NEGATIVE  NITRITE NEGATIVE  LEUKOCYTESUR NEGATIVE       Component Value Date/Time   CHOL 149 06/30/2020 0043   TRIG 73 07/02/2020 0424   HDL 23 (L) 06/30/2020 0043   CHOLHDL 6.5 06/30/2020 0043   VLDL 33 06/30/2020 0043   LDLCALC 93 06/30/2020 0043   Lab Results  Component Value Date   HGBA1C 5.6 06/30/2020      Component Value Date/Time   LABOPIA NONE DETECTED 06/29/2020 1749   COCAINSCRNUR NONE DETECTED 06/29/2020 1749   LABBENZ NONE DETECTED 06/29/2020 1749  AMPHETMU NONE DETECTED 06/29/2020 1749   THCU NONE DETECTED 06/29/2020 1749   LABBARB NONE DETECTED 06/29/2020 1749    Recent Labs  Lab 06/29/20 0904  ETH <10    I have personally reviewed the radiological images below and agree with the radiology interpretations.  CT Code Stroke CTA Head W/WO contrast  Result Date: 06/29/2020 CLINICAL DATA:  Hyperdense vessel and stroke-like symptoms EXAM: CT ANGIOGRAPHY HEAD AND NECK CT PERFUSION BRAIN TECHNIQUE: Multidetector CT imaging of the head and neck was performed using the standard protocol during bolus administration of intravenous contrast. Multiplanar CT image reconstructions and MIPs were obtained to evaluate the vascular anatomy. Carotid stenosis measurements (when applicable) are obtained utilizing NASCET criteria, using the distal internal carotid diameter as the denominator. Multiphase CT imaging of the brain was performed following IV bolus contrast injection. Subsequent parametric perfusion maps were calculated using RAPID software. CONTRAST:  Dose is not yet known COMPARISON:  None. FINDINGS: CTA NECK FINDINGS Aortic arch: Mild atheromatous changes. Three vessel  branching. No acute finding. Right carotid system: Atherosclerotic plaque about the bifurcation with occluded ICA bulb. No flow seen within the ICA in the neck. Left carotid system: Atheromatous plaque at the bifurcation and bulb. No stenosis or ulceration. Negative for beading. Vertebral arteries: Low-density plaque at the left subclavian origin. No flow limiting subclavian stenosis. Plaque at the left vertebral origin without stenosis, beading, or dissection. Skeleton: Focal advanced C5-6 disc degeneration with ridging causing biforaminal impingement. Ordinary facet osteoarthritis. Other neck: No acute or aggressive finding. Right-sided chronic sinusitis with pattern middle meatus obstruction. Upper chest: Granulomatous nodal calcifications. There is also a calcified granuloma appearance in the left upper lobe. Review of the MIP images confirms the above findings CTA HEAD FINDINGS Anterior circulation: No flow seen in the right carotid or MCA branches. There is distal M2 M3 branch reconstitution. Calcified plaque along the left carotid siphon. No MCA branch occlusion, aneurysm, or beading. Posterior circulation: The vertebral and basilar arteries are smooth and widely patent. High-grade atheromatous narrowings of the bilateral PCA, P3 segment on the right and upper first order branch on the left. Venous sinuses: Patent as permitted by contrast timing Anatomic variants: None significant Review of the MIP images confirms the above findings CT Brain Perfusion Findings: ASPECTS: 9 CBF (<30%) Volume: 7mL Perfusion (Tmax>6.0s) volume: 154mL Mismatch Volume: 117mL Infarction Location:Right basal ganglia and lesser extensive lateral frontal cortex infarct. Critical Value/emergent results were called by telephone at the time of interpretation on 06/29/2020 at 9:37 am to provider ERIC [facility] , who verbally acknowledged these results. IMPRESSION: 1. Emergent large vessel occlusion with no flow seen in the right internal  carotid or proximal MCA. There is a 36 cc core infarct and 142 cc of penumbra by CT perfusion. 2. Atherosclerosis without flow limiting stenosis of the other major vessels. 3. Bilateral high-grade atheromatous PCA narrowings, more proximal on the right. 4. 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 Electronically Signed   By: Monte Fantasia M.D.   On: 06/29/2020 09:53   CT HEAD WO CONTRAST  Result Date: 07/01/2020 CLINICAL DATA:  Follow-up intracranial hemorrhage EXAM: CT HEAD WITHOUT CONTRAST TECHNIQUE: Contiguous axial images were obtained from the base of the skull through the vertex without intravenous contrast. COMPARISON:  CT from 2 days ago FINDINGS: Brain: More contracted and homogeneous hematoma in the right basal ganglia measuring up to 2.8 x 2 cm. Cytotoxic edema from infarct has become apparent in the right basal ganglia and adjacent frontal insular  cortex. No interval hemorrhage, hydrocephalus, or midline shift. Vascular: The right ICA is hyperdense below the skull base, unchanged. Skull: No acute or aggressive finding Sinuses/Orbits: Chronic right maxillary, ethmoid, and frontal sinusitis. Generalized mucosal thickening seen elsewhere, progressed. IMPRESSION: 1. Interval contraction of the right basal ganglia hematoma. 2. Expected evolution of the right frontoinsular infarcts. 3. 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 4. No new abnormality. Electronically Signed   By: Monte Fantasia M.D.   On: 07/01/2020 07:16   CT HEAD WO CONTRAST  Result Date: 06/29/2020 CLINICAL DATA:  Follow-up stroke and subsequent intervention. EXAM: CT HEAD WITHOUT CONTRAST TECHNIQUE: Contiguous axial images were obtained from the base of the skull through the vertex without intravenous contrast. COMPARISON:  Earlier same day FINDINGS: Brain: 3.9 x 3.0 x 3.0 cm hyperdense region in the right posterior basal ganglia most consistent with a parenchymal hematoma, given small  internal fluid levels. Mild mass effect and surrounding edema. No midline shift. Chronic ischemic changes in the right posterior frontal region as seen previously. Vascular: No other new vascular finding. Skull: Negative Sinuses/Orbits: Opacified right maxillary and ethmoid sinuses. Orbits negative. Other: None IMPRESSION: 1. 3.9 x 3.0 x 3.0 cm hyperdense region in the right posterior basal ganglia most consistent with parenchymal hematoma, given small internal fluid levels. Mild mass effect and surrounding edema. No midline shift. 2. Chronic ischemic changes in the right posterior frontal region as seen previously. 3. Opacified right maxillary and ethmoid sinuses. Electronically Signed   By: Nelson Chimes M.D.   On: 06/29/2020 18:48   CT Code Stroke CTA Neck W/WO contrast  Result Date: 06/29/2020 CLINICAL DATA:  Hyperdense vessel and stroke-like symptoms EXAM: CT ANGIOGRAPHY HEAD AND NECK CT PERFUSION BRAIN TECHNIQUE: Multidetector CT imaging of the head and neck was performed using the standard protocol during bolus administration of intravenous contrast. Multiplanar CT image reconstructions and MIPs were obtained to evaluate the vascular anatomy. Carotid stenosis measurements (when applicable) are obtained utilizing NASCET criteria, using the distal internal carotid diameter as the denominator. Multiphase CT imaging of the brain was performed following IV bolus contrast injection. Subsequent parametric perfusion maps were calculated using RAPID software. CONTRAST:  Dose is not yet known COMPARISON:  None. FINDINGS: CTA NECK FINDINGS Aortic arch: Mild atheromatous changes. Three vessel branching. No acute finding. Right carotid system: Atherosclerotic plaque about the bifurcation with occluded ICA bulb. No flow seen within the ICA in the neck. Left carotid system: Atheromatous plaque at the bifurcation and bulb. No stenosis or ulceration. Negative for beading. Vertebral arteries: Low-density plaque at the left  subclavian origin. No flow limiting subclavian stenosis. Plaque at the left vertebral origin without stenosis, beading, or dissection. Skeleton: Focal advanced C5-6 disc degeneration with ridging causing biforaminal impingement. Ordinary facet osteoarthritis. Other neck: No acute or aggressive finding. Right-sided chronic sinusitis with pattern middle meatus obstruction. Upper chest: Granulomatous nodal calcifications. There is also a calcified granuloma appearance in the left upper lobe. Review of the MIP images confirms the above findings CTA HEAD FINDINGS Anterior circulation: No flow seen in the right carotid or MCA branches. There is distal M2 M3 branch reconstitution. Calcified plaque along the left carotid siphon. No MCA branch occlusion, aneurysm, or beading. Posterior circulation: The vertebral and basilar arteries are smooth and widely patent. High-grade atheromatous narrowings of the bilateral PCA, P3 segment on the right and upper first order branch on the left. Venous sinuses: Patent as permitted by contrast timing Anatomic variants: None significant Review of  the MIP images confirms the above findings CT Brain Perfusion Findings: ASPECTS: 9 CBF (<30%) Volume: 58mL Perfusion (Tmax>6.0s) volume: 160mL Mismatch Volume: 130mL Infarction Location:Right basal ganglia and lesser extensive lateral frontal cortex infarct. Critical Value/emergent results were called by telephone at the time of interpretation on 06/29/2020 at 9:37 am to provider ERIC [facility] , who verbally acknowledged these results. IMPRESSION: 1. Emergent large vessel occlusion with no flow seen in the right internal carotid or proximal MCA. There is a 36 cc core infarct and 142 cc of penumbra by CT perfusion. 2. Atherosclerosis without flow limiting stenosis of the other major vessels. 3. Bilateral high-grade atheromatous PCA narrowings, more proximal on the right. 4. 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  Electronically Signed   By: Monte Fantasia M.D.   On: 06/29/2020 09:53   MR BRAIN WO CONTRAST  Result Date: 06/30/2020 CLINICAL DATA:  Stroke follow-up EXAM: MRI HEAD WITHOUT CONTRAST TECHNIQUE: Multiplanar, multiecho pulse sequences of the brain and surrounding structures were obtained without intravenous contrast. COMPARISON:  Head CT and CTA from yesterday FINDINGS: Brain: Acute infarction at the right stratum and patchy along the insula and lateral frontal cortex. The area of infarction appears similar to the area of pre treatment core infarct. Hematoma centered at the right putamen with dimensions better assessed by prior CT, grossly similar at to 3.5 cm anterior to posterior. Correlating to fluid fluid level, there is a posterior dense clot appearance and more heterogeneous anterior component. On gradient imaging petechial hemorrhage is seen along the right MCA watershed. No hydrocephalus or shift. Remote high right frontal cortex infarct anteriorly. Vascular: No flow voids seen in the left ICA beginning in the upper cervical spine. There is normalization by the supraclinoid segment. Skull and upper cervical spine: No focal marrow lesion Sinuses/Orbits: Right middle meatus obstruction with frontal, ethmoid, and especially maxillary sinusitis on the right. IMPRESSION: 1. Acute infarct at the right basal ganglia with nonprogressive hemorrhage when correlated with prior CT. Less extensive patchy acute cortical infarct in the right MCA territory. The areas of acute infarction correlate well with pretreatment CTP. 2. 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 Electronically Signed   By: Monte Fantasia M.D.   On: 06/30/2020 04:22   CT Code Stroke Cerebral Perfusion with contrast  Result Date: 06/29/2020 CLINICAL DATA:  Hyperdense vessel and stroke-like symptoms EXAM: CT ANGIOGRAPHY HEAD AND NECK CT PERFUSION BRAIN TECHNIQUE: Multidetector CT imaging of the head  and neck was performed using the standard protocol during bolus administration of intravenous contrast. Multiplanar CT image reconstructions and MIPs were obtained to evaluate the vascular anatomy. Carotid stenosis measurements (when applicable) are obtained utilizing NASCET criteria, using the distal internal carotid diameter as the denominator. Multiphase CT imaging of the brain was performed following IV bolus contrast injection. Subsequent parametric perfusion maps were calculated using RAPID software. CONTRAST:  Dose is not yet known COMPARISON:  None. FINDINGS: CTA NECK FINDINGS Aortic arch: Mild atheromatous changes. Three vessel branching. No acute finding. Right carotid system: Atherosclerotic plaque about the bifurcation with occluded ICA bulb. No flow seen within the ICA in the neck. Left carotid system: Atheromatous plaque at the bifurcation and bulb. No stenosis or ulceration. Negative for beading. Vertebral arteries: Low-density plaque at the left subclavian origin. No flow limiting subclavian stenosis. Plaque at the left vertebral origin without stenosis, beading, or dissection. Skeleton: Focal advanced C5-6 disc degeneration with ridging causing biforaminal impingement. Ordinary facet osteoarthritis. Other  neck: No acute or aggressive finding. Right-sided chronic sinusitis with pattern middle meatus obstruction. Upper chest: Granulomatous nodal calcifications. There is also a calcified granuloma appearance in the left upper lobe. Review of the MIP images confirms the above findings CTA HEAD FINDINGS Anterior circulation: No flow seen in the right carotid or MCA branches. There is distal M2 M3 branch reconstitution. Calcified plaque along the left carotid siphon. No MCA branch occlusion, aneurysm, or beading. Posterior circulation: The vertebral and basilar arteries are smooth and widely patent. High-grade atheromatous narrowings of the bilateral PCA, P3 segment on the right and upper first order  branch on the left. Venous sinuses: Patent as permitted by contrast timing Anatomic variants: None significant Review of the MIP images confirms the above findings CT Brain Perfusion Findings: ASPECTS: 9 CBF (<30%) Volume: 69mL Perfusion (Tmax>6.0s) volume: 123mL Mismatch Volume: 192mL Infarction Location:Right basal ganglia and lesser extensive lateral frontal cortex infarct. Critical Value/emergent results were called by telephone at the time of interpretation on 06/29/2020 at 9:37 am to provider ERIC [facility] , who verbally acknowledged these results. IMPRESSION: 1. Emergent large vessel occlusion with no flow seen in the right internal carotid or proximal MCA. There is a 36 cc core infarct and 142 cc of penumbra by CT perfusion. 2. Atherosclerosis without flow limiting stenosis of the other major vessels. 3. Bilateral high-grade atheromatous PCA narrowings, more proximal on the right. 4. 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 Electronically Signed   By: Monte Fantasia M.D.   On: 06/29/2020 09:53   Portable Chest xray  Result Date: 06/30/2020 CLINICAL DATA:  Respiratory failure.  Stroke. EXAM: PORTABLE CHEST 1 VIEW COMPARISON:  06/29/2020 FINDINGS: 0538 hours. Endotracheal tube tip is 7.4 cm above the base of the carina. The lungs are clear without focal pneumonia, edema, pneumothorax or pleural effusion. The cardiopericardial silhouette is within normal limits for size. The visualized bony structures of the thorax show no acute abnormality. Telemetry leads overlie the chest. IMPRESSION: No acute cardiopulmonary findings. Electronically Signed   By: Misty Stanley M.D.   On: 06/30/2020 07:30   DG CHEST PORT 1 VIEW  Result Date: 06/29/2020 CLINICAL DATA:  Code stroke with coiling intubated EXAM: PORTABLE CHEST 1 VIEW COMPARISON:  None. FINDINGS: Endotracheal tube tip is about 4.2 cm superior to the carina. Esophageal tube tip is below the diaphragm but incompletely  visualized. No focal opacity or pleural effusion. Normal cardiac size. No pneumothorax. IMPRESSION: Endotracheal tube tip about 4.2 cm superior to the carina. Lungs grossly clear Electronically Signed   By: Donavan Foil M.D.   On: 06/29/2020 16:00   DG Swallowing Func-Speech Pathology  Result Date: 07/01/2020 Objective Swallowing Evaluation: Type of Study: Bedside Swallow Evaluation  Patient Details Name: KAMAREE WHEATLEY MRN: [id number] Date of Birth: [birth year] Today's Date: 07/01/2020 Time: SLP Start Time (ACUTE ONLY): 1252 -SLP Stop Time (ACUTE ONLY): 1310 SLP Time Calculation (min) (ACUTE ONLY): 18 min Past Medical History: No past medical history on file. Past Surgical History: Past Surgical History: Procedure Laterality Date . RADIOLOGY WITH ANESTHESIA N/A 06/29/2020  Procedure: IR WITH ANESTHESIA;  Surgeon: Radiologist, Medication, MD;  Location: [place];  Service: Radiology;  Laterality: N/A; HPI: Patient is a 69 y/o male who presents with left sided weakness, right gaze and slurred speech. NIH:17. Head CT- dense Right MCA infarct. CTA- Right ICA and MCA occlusions. s/p right MCA thrombectomy and right ICA stent placement 06/29/20 with post procedure hemmorhage. ETT 10/4-10/5. No PMH.  Subjective: alert, cooperative  Assessment / Plan / Recommendation CHL IP CLINICAL IMPRESSIONS 07/01/2020 Clinical Impression Pt has an oral more than pharyngeal dysphagia, with oral preparation and transit impacted by reduced strength and suspect sensation as well. He has intermittent trouble getting liquids via cup or straw, with reduced seal and sucking. There is anterior spillage with thin liquids; L buccal pocketing with more solid consistencies and slower posterior transit. Mild premature spillage occurs with thin liquids. He has relatively improved pharyngeal function with only mildly reduced base of tongue retraction that results in trace-mild vallecular residue with purees and mild residue with solids. Recommend starting  Dys 1 diet and thin liquids. Will f/u for advancement of solids clinically.  SLP Visit Diagnosis Dysphagia, oropharyngeal phase (R13.12) Attention and concentration deficit following -- Frontal lobe and executive function deficit following -- Impact on safety and function Mild aspiration risk   CHL IP TREATMENT RECOMMENDATION 07/01/2020 Treatment Recommendations Therapy as outlined in treatment plan below   Prognosis 07/01/2020 Prognosis for Safe Diet Advancement Good Barriers to Reach Goals -- Barriers/Prognosis Comment -- CHL IP DIET RECOMMENDATION 07/01/2020 SLP Diet Recommendations Dysphagia 1 (Puree) solids;Thin liquid Liquid Administration via Straw;Cup Medication Administration Crushed with puree Compensations Minimize environmental distractions;Slow rate;Small sips/bites;Lingual sweep for clearance of pocketing Postural Changes Seated upright at 90 degrees   CHL IP OTHER RECOMMENDATIONS 07/01/2020 Recommended Consults -- Oral Care Recommendations Oral care before and after PO Other Recommendations Have oral suction available   CHL IP FOLLOW UP RECOMMENDATIONS 07/01/2020 Follow up Recommendations Inpatient Rehab   CHL IP FREQUENCY AND DURATION 07/01/2020 Speech Therapy Frequency (ACUTE ONLY) min 2x/week Treatment Duration 2 weeks      CHL IP ORAL PHASE 07/01/2020 Oral Phase Impaired Oral - Pudding Teaspoon -- Oral - Pudding Cup -- Oral - Honey Teaspoon -- Oral - Honey Cup -- Oral - Nectar Teaspoon -- Oral - Nectar Cup -- Oral - Nectar Straw -- Oral - Thin Teaspoon -- Oral - Thin Cup Left anterior bolus loss;Weak lingual manipulation;Decreased bolus cohesion;Premature spillage Oral - Thin Straw Weak lingual manipulation;Decreased bolus cohesion;Premature spillage Oral - Puree Weak lingual manipulation;Decreased bolus cohesion;Premature spillage Oral - Mech Soft Weak lingual manipulation;Decreased bolus cohesion;Premature spillage;Left pocketing in lateral sulci Oral - Regular -- Oral - Multi-Consistency -- Oral -  Pill -- Oral Phase - Comment --  CHL IP PHARYNGEAL PHASE 07/01/2020 Pharyngeal Phase Impaired Pharyngeal- Pudding Teaspoon -- Pharyngeal -- Pharyngeal- Pudding Cup -- Pharyngeal -- Pharyngeal- Honey Teaspoon -- Pharyngeal -- Pharyngeal- Honey Cup -- Pharyngeal -- Pharyngeal- Nectar Teaspoon -- Pharyngeal -- Pharyngeal- Nectar Cup -- Pharyngeal -- Pharyngeal- Nectar Straw -- Pharyngeal -- Pharyngeal- Thin Teaspoon -- Pharyngeal -- Pharyngeal- Thin Cup Delayed swallow initiation-pyriform sinuses;Reduced tongue base retraction Pharyngeal -- Pharyngeal- Thin Straw Delayed swallow initiation-pyriform sinuses;Reduced tongue base retraction Pharyngeal -- Pharyngeal- Puree Reduced tongue base retraction;Pharyngeal residue - valleculae Pharyngeal -- Pharyngeal- Mechanical Soft Reduced tongue base retraction;Pharyngeal residue - valleculae Pharyngeal -- Pharyngeal- Regular -- Pharyngeal -- Pharyngeal- Multi-consistency -- Pharyngeal -- Pharyngeal- Pill -- Pharyngeal -- Pharyngeal Comment --  CHL IP CERVICAL ESOPHAGEAL PHASE 07/01/2020 Cervical Esophageal Phase WFL Pudding Teaspoon -- Pudding Cup -- Honey Teaspoon -- Honey Cup -- Nectar Teaspoon -- Nectar Cup -- Nectar Straw -- Thin Teaspoon -- Thin Cup -- Thin Straw -- Puree -- Mechanical Soft -- Regular -- Multi-consistency -- Pill -- Cervical Esophageal Comment -- [name], M.A. [place] Pager [phone] Office [phone] 07/01/2020, 2:21 PM  ECHOCARDIOGRAM COMPLETE  Result Date: 06/30/2020    ECHOCARDIOGRAM REPORT   Patient Name:   Tyler Richards Date of Exam: 06/30/2020 Medical Rec #:  [id number]     Height:       74.0 in Accession #:    [id number]    Weight:       187.6 lb Date of Birth:  June 03, 1951     BSA:          2.115 m Patient Age:    21 years      BP:           162/65 mmHg Patient Gender: M             HR:           52 bpm. Exam Location:  Inpatient Procedure: 2D Echo, Cardiac Doppler, Color Doppler and Intracardiac             Opacification Agent Indications:    CVA  History:        Patient has no prior history of Echocardiogram examinations.                 Stroke and COPD, Signs/Symptoms:Resp. failure; Risk                 Factors:Hypertension.  Sonographer:    Dustin Flock Referring Phys: [phone] ERIC LINDZEN  Sonographer Comments: Technically difficult study due to poor echo windows and echo performed with patient supine and on artificial respirator. Image acquisition challenging due to COPD and Image acquisition challenging due to respiratory motion. IMPRESSIONS  1. Left ventricular ejection fraction, by estimation, is 50 to 55%. The left ventricle has low normal function. The left ventricle has no regional wall motion abnormalities. Left ventricular diastolic parameters are consistent with Grade I diastolic dysfunction (impaired relaxation).  2. Right ventricular systolic function is normal. The right ventricular size is normal. There is mildly elevated pulmonary artery systolic pressure.  3. Left atrial size was mildly dilated.  4. The mitral valve is normal in structure. No evidence of mitral valve regurgitation. No evidence of mitral stenosis.  5. The aortic valve is normal in structure. Aortic valve regurgitation is not visualized. No aortic stenosis is present.  6. The inferior vena cava is dilated in size with <50% respiratory variability, suggesting right atrial pressure of 15 mmHg. FINDINGS  Left Ventricle: Left ventricular ejection fraction, by estimation, is 50 to 55%. The left ventricle has low normal function. The left ventricle has no regional wall motion abnormalities. Definity contrast agent was given IV to delineate the left ventricular endocardial borders. The left ventricular internal cavity size was normal in size. There is no left ventricular hypertrophy. Left ventricular diastolic parameters are consistent with Grade I diastolic dysfunction (impaired relaxation). Indeterminate filling pressures. Right  Ventricle: The right ventricular size is normal. No increase in right ventricular wall thickness. Right ventricular systolic function is normal. There is mildly elevated pulmonary artery systolic pressure. The tricuspid regurgitant velocity is 2.43  m/s, and with an assumed right atrial pressure of 15 mmHg, the estimated right ventricular systolic pressure is 09.6 mmHg. Left Atrium: Left atrial size was mildly dilated. Right Atrium: Right atrial size was normal in size. Pericardium: There is no evidence of pericardial effusion. Mitral Valve: The mitral valve is normal in structure. No evidence of mitral valve regurgitation. No evidence of mitral valve stenosis. Tricuspid Valve: The tricuspid valve is normal in structure. Tricuspid valve regurgitation is trivial. No evidence of tricuspid stenosis. Aortic  Valve: The aortic valve is normal in structure. Aortic valve regurgitation is not visualized. No aortic stenosis is present. Pulmonic Valve: The pulmonic valve was normal in structure. Pulmonic valve regurgitation is not visualized. No evidence of pulmonic stenosis. Aorta: The aortic root is normal in size and structure. Venous: The inferior vena cava is dilated in size with less than 50% respiratory variability, suggesting right atrial pressure of 15 mmHg. IAS/Shunts: No atrial level shunt detected by color flow Doppler.  LEFT VENTRICLE PLAX 2D LVIDd:         5.30 cm  Diastology LVIDs:         3.80 cm  LV e' medial:    6.85 cm/s LV PW:         1.30 cm  LV E/e' medial:  10.2 LV IVS:        1.00 cm  LV e' lateral:   12.10 cm/s LVOT diam:     2.10 cm  LV E/e' lateral: 5.8 LV SV:         106 LV SV Index:   50 LVOT Area:     3.46 cm  RIGHT VENTRICLE RV Basal diam:  3.80 cm RV S prime:     7.07 cm/s TAPSE (M-mode): 3.3 cm LEFT ATRIUM           Index       RIGHT ATRIUM           Index LA diam:      4.30 cm 2.03 cm/m  RA Area:     21.00 cm LA Vol (A4C): 45.5 ml 21.51 ml/m RA Volume:   67.30 ml  31.82 ml/m  AORTIC VALVE  LVOT Vmax:   150.00 cm/s LVOT Vmean:  90.400 cm/s LVOT VTI:    0.305 m  AORTA Ao Root diam: 3.50 cm MITRAL VALVE               TRICUSPID VALVE MV Area (PHT): 2.80 cm    TR Peak grad:   23.6 mmHg MV Decel Time: 271 msec    TR Vmax:        243.00 cm/s MV E velocity: 69.80 cm/s MV A velocity: 68.60 cm/s  SHUNTS MV E/A ratio:  1.02        Systemic VTI:  0.30 m                            Systemic Diam: 2.10 cm Skeet Latch MD Electronically signed by Skeet Latch MD Signature Date/Time: 06/30/2020/11:25:18 AM    Final    CT HEAD CODE STROKE WO CONTRAST  Result Date: 06/29/2020 CLINICAL DATA:  Code stroke.  Slurred speech and left facial droop EXAM: CT HEAD WITHOUT CONTRAST TECHNIQUE: Contiguous axial images were obtained from the base of the skull through the vertex without intravenous contrast. COMPARISON:  None. FINDINGS: Brain: Small remote appearing cortically based infarcts in the superior right frontal lobe. No hemorrhage, hydrocephalus, or masslike finding. Vascular: Hyperdense distal right ICA to MCA branches. Atherosclerotic calcification. Skull: Negative Sinuses/Orbits: Right maxillary, ethmoid, and frontal sinus opacification with sclerotic wall thickening at the maxillary sinus. Other: Critical Value/emergent results were called by telephone at the time of interpretation on 06/29/2020 at 9:23 am to provider Lindzen , who verbally acknowledged these results. ASPECTS [facility] Stroke Program Early CT Score) - Ganglionic level infarction (caudate, lentiform nuclei, internal capsule, insula, M1-M3 cortex): Posterior putamen appears blunted compared to the left. Equivocal for small insular cortex  infarct. - Supraganglionic infarction (M4-M6 cortex): 3, when accounting for chronic infarct Total score (0-10 with 10 being normal): 9, when accounting for chronic infarct IMPRESSION: 1. Hyperdense distal right ICA and proximal MCA. 2. Blunted appearance of the posterior right putamen. Chronic right high  frontal cortex infarcts. ASPECTS is 9 when excluding the chronic changes. Electronically Signed   By: Monte Fantasia M.D.   On: 06/29/2020 09:25   VAS US CAROTID  Result Date: 07/02/2020 Carotid Arterial Duplex Study Indications:   CVA and Right stent. Other Factors: Rt ica stent- 06/29/20. Performing Technologist: [name] RVS  Examination Guidelines: A complete evaluation includes B-mode imaging, spectral Doppler, color Doppler, and power Doppler as needed of all accessible portions of each vessel. Bilateral testing is considered an integral part of a complete examination. Limited examinations for reoccurring indications may be performed as noted.  Right Carotid Findings: +----------+--------+--------+--------+------------------+--------+           PSV cm/sEDV cm/sStenosisPlaque DescriptionComments +----------+--------+--------+--------+------------------+--------+ CCA Prox  101                     heterogenous               +----------+--------+--------+--------+------------------+--------+ CCA Distal57                      heterogenous               +----------+--------+--------+--------+------------------+--------+ ECA       103     6                                          +----------+--------+--------+--------+------------------+--------+ +----------+--------+-------+--------+-------------------+           PSV cm/sEDV cmsDescribeArm Pressure (mmHG) +----------+--------+-------+--------+-------------------+ Subclavian160                                        +----------+--------+-------+--------+-------------------+ +---------+--------+--+--------+--+---------+ VertebralPSV cm/s63EDV cm/s12Antegrade +---------+--------+--+--------+--+---------+  Right Stent(s): +---------------+--+-+--------+++ Prox to Stent  274         +---------------+--+-+--------+++ Proximal Stent 49          +---------------+--+-+--------+++ Mid Stent          occluded +---------------+--+-+--------+++ Distal Stent      occluded +---------------+--+-+--------+++ Distal to Stent   occluded +---------------+--+-+--------+++   Left Carotid Findings: +----------+--------+--------+--------+------------------+--------+           PSV cm/sEDV cm/sStenosisPlaque DescriptionComments +----------+--------+--------+--------+------------------+--------+ CCA Prox  133     18              heterogenous               +----------+--------+--------+--------+------------------+--------+ CCA Distal95      17              heterogenous               +----------+--------+--------+--------+------------------+--------+ ICA Prox  97      22      1-39%   heterogenous               +----------+--------+--------+--------+------------------+--------+ ICA Distal108     36                                         +----------+--------+--------+--------+------------------+--------+  ECA       120     10                                         +----------+--------+--------+--------+------------------+--------+ +----------+--------+--------+--------+-------------------+           PSV cm/sEDV cm/sDescribeArm Pressure (mmHG) +----------+--------+--------+--------+-------------------+ [id number]                                         +----------+--------+--------+--------+-------------------+ +---------+--------+--+--------+--+---------+ VertebralPSV cm/s48EDV cm/s13Antegrade +---------+--------+--+--------+--+---------+   Summary: Right Carotid: ICA stent appears occluded. Left Carotid: Velocities in the left ICA are consistent with a 1-39% stenosis. Vertebrals: Bilateral vertebral arteries demonstrate antegrade flow. *See table(s) above for measurements and observations.  Electronically signed by Antony Contras MD on 07/02/2020 at 8:34:52 AM.    Final    VAS [place] LOWER EXTREMITY VENOUS (DVT)  Result Date: 07/01/2020  Lower Venous  DVTStudy Indications: Stroke.  Comparison Study: no prior Performing Technologist: [name] RVS  Examination Guidelines: A complete evaluation includes B-mode imaging, spectral Doppler, color Doppler, and power Doppler as needed of all accessible portions of each vessel. Bilateral testing is considered an integral part of a complete examination. Limited examinations for reoccurring indications may be performed as noted. The reflux portion of the exam is performed with the patient in reverse Trendelenburg.  +---------+---------------+---------+-----------+----------+--------------+ RIGHT    CompressibilityPhasicitySpontaneityPropertiesThrombus Aging +---------+---------------+---------+-----------+----------+--------------+ CFV      Full           Yes      Yes                                 +---------+---------------+---------+-----------+----------+--------------+ SFJ      Full                                                        +---------+---------------+---------+-----------+----------+--------------+ FV Prox  Full                                                        +---------+---------------+---------+-----------+----------+--------------+ FV Mid   Full                                                        +---------+---------------+---------+-----------+----------+--------------+ FV DistalFull                                                        +---------+---------------+---------+-----------+----------+--------------+ PFV      Full                                                        +---------+---------------+---------+-----------+----------+--------------+  POP      Full           Yes      Yes                                 +---------+---------------+---------+-----------+----------+--------------+ PTV      Full                                                         +---------+---------------+---------+-----------+----------+--------------+ PERO     Full                                                        +---------+---------------+---------+-----------+----------+--------------+   +---------+---------------+---------+-----------+----------+--------------+ LEFT     CompressibilityPhasicitySpontaneityPropertiesThrombus Aging +---------+---------------+---------+-----------+----------+--------------+ CFV      Full           Yes      Yes                                 +---------+---------------+---------+-----------+----------+--------------+ SFJ      Full                                                        +---------+---------------+---------+-----------+----------+--------------+ FV Prox  Full                                                        +---------+---------------+---------+-----------+----------+--------------+ FV Mid   Full                                                        +---------+---------------+---------+-----------+----------+--------------+ FV DistalFull                                                        +---------+---------------+---------+-----------+----------+--------------+ PFV      Full                                                        +---------+---------------+---------+-----------+----------+--------------+ POP      Full           Yes      Yes                                 +---------+---------------+---------+-----------+----------+--------------+  PTV      Full                                                        +---------+---------------+---------+-----------+----------+--------------+     Summary: BILATERAL: - No evidence of deep vein thrombosis seen in the lower extremities, bilaterally. - No evidence of superficial venous thrombosis in the lower extremities, bilaterally. -   *See table(s) above for measurements and observations. Electronically signed by  Harold Barban MD on 07/01/2020 at 9:06:00 PM.    Final     PHYSICAL EXAM   Temp:  [98.3 F (36.8 C)-100.9 F (38.3 C)] 99.1 F (37.3 C) (10/07 0800) Pulse Rate:  [36-96] 71 (10/07 1030) Resp:  [14-32] 18 (10/07 1030) BP: (75-159)/(41-133) 111/56 (10/07 1030) SpO2:  [96 %-100 %] 99 % (10/07 1030)  General - Well nourished, well developed, not in acute distress.  Ophthalmologic - fundi not visualized due to noncooperation.  Cardiovascular - Regular rate and rhythm.  Neuro - awake alert, eyes open, orientated x 4, no aphasia, able to name and repeat but mild dysarthria, able to follow simple commands. Eyes in right gaze perference position but able to cross midline and left gaze incomplete, left hemianopia and visual neglect, however, able to track bilaterally, PERRL. Left facial droop and decreased eye closure. RUE and RLE at least 4/5. LUE proximal 2-/5 and distal 0/5, LLE 2/5 proximal and 0/5 distally. DTR 1+ and no babinski. Sensation symmetrical per pt, however, left sensory neglect. Right FTN intact on the right and gait not tested.   ASSESSMENT/PLAN Mr. EEAN BUSS is a 69 y.o. male with history of gout admitted for left-sided weakness, right gaze, left facial droop with fall. No tPA given due to outside window.   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 Evolution R frontoinsular infarcts. R ICA occluded.  Carotid Doppler confirmed right ICA re-occluded  2D Echo EF 50 to  55%  LE venous Doppler no DVT  LDL 93  HgbA1c 5.6  Heparin sq for VTE prophylaxis    No antithrombotic prior to admission, now on aspirin 81 mg daily and Brilinta (ticagrelor) 90 mg bid.    Ongoing aggressive stroke risk factor management  Therapy recommendations:  CIR  Disposition:  Pending  Carotid occlusion  CTA head and neck right ICA and MCA occlusion  S/p carotid stenting due to severe athero but repeat carotid Doppler confirmed right ICA reoccluded  On ASA and brilinta  Hypotension and bradycardia Orthostatic hypotension  Likely related to carotid stenting  BP goal 120-160  Had orthostatic episode 10/7 with PT - BP down to 80s on sitting  On Levophed -> turn off -> resumed after orthostatic episode  Cardiology on board  On Midodrine 10 Q8  Long term BP goal 130-150 given occluded right ICA  Hyperlipidemia  Home meds:  None  LDL 93, goal < 70  Add lipitor 40  Continue statin at discharge  Dysphagia  Passed swallow  On dys 1 and thin liquid  Speech on board  Other Stroke Risk Factors    Other Active Problems  Gout  Hypokalemia 3.6->3.4 - supplement  Hospital day # 3  This patient is critically ill due to right MCA stroke, s/p thrombectomy and ICA stenting, orthostatic hypotension, bradycardia with pause and at significant risk of neurological worsening, death form recurrent stroke, hemorrhagic conversion, seizure, syncope, cardiac arrest. This patient's care requires constant monitoring of vital signs, hemodynamics, respiratory and cardiac monitoring, review of multiple databases, neurological assessment, discussion with family, other specialists and medical decision making of high complexity. I spent 50 minutes of neurocritical care time in the care of this patient. I had long discussion with wife at bedside, updated pt current condition, treatment plan and potential prognosis, and answered all the questions. She expressed  understanding and appreciation.   Rosalin Hawking, MD PhD Stroke Neurology 07/02/2020 11:06 AM     To contact Stroke Continuity provider, please refer to [email or web address] After hours, contact General Neurology

## 2020-07-02 NOTE — Progress Notes (Signed)
STROKE TEAM PROGRESS NOTE   SUBJECTIVE (INTERVAL HISTORY) Wife at bedside.  Patient reclining in bed, awake alert, neuro stable.  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  BP 110s, on Levophed.  Per RN, if Levophed was off, BP down to 80s.  On Midrin 10, will add Florinef.  Patient had overnight several episode of bloody bowel movements.  Per patient, is not large amount.  GI consulted, recommend stop Brilinta and heparin subq.  Initially plan for colonoscopy, however discussed with cardiology, given bradycardia and needing temporary pacing for procedure, talked with patient and wife, will hold off colonoscopy until patient hypotension and bradycardia improved.  Close monitoring for H&H and bowel movement.  OBJECTIVE Temp:  [98.3 F (36.8 C)-100.9 F (38.3 C)] 99.6 F (37.6 C) (10/07 1200) Pulse Rate:  [36-96] 52 (10/07 1300) Cardiac Rhythm: Normal sinus rhythm (10/07 0400) Resp:  [15-31] 24 (10/07 1300) BP: (75-159)/(41-133) 127/53 (10/07 1300) SpO2:  [89 %-100 %] 99 % (10/07 1300)  Recent Labs  Lab 06/29/20 0904  GLUCAP 88   Recent Labs  Lab 06/29/20 0904 06/29/20 0904 06/29/20 0915 06/29/20 1738 06/30/20 0043 07/01/20 0433 07/02/20 0424  NA 136   < > 138 138 138 138 135  K 5.0   < > 4.5 3.8 3.9 3.6 3.4*  CL 103  --  105  --  106 106 104  CO2 20*  --   --   --  20* 21* 22  GLUCOSE 104*  --  105*  --  113* 107* 103*  BUN 14  --  17  --  10 8 11   CREATININE 0.87  --  0.70  --  0.89 0.85 0.79  CALCIUM 9.1   < >  --   --  8.4* 8.5* 8.4*   < > = values in this interval not displayed.   Recent Labs  Lab 06/29/20 0904  AST 36  ALT 17  ALKPHOS 61  BILITOT 1.8*  PROT 6.5  ALBUMIN 3.3*   Recent Labs  Lab 06/29/20 0904 06/29/20 0904 06/29/20 0915 06/29/20 1738 06/30/20 0043 07/01/20 0433 07/02/20 0424  WBC 9.0  --   --   --  9.7 11.2* 10.5  NEUTROABS 6.8  --   --   --  7.2  --   --   HGB 13.9   < > 14.3 11.9* 12.2* 11.7* 10.1*  HCT 41.2   < > 42.0  35.0* 36.2* 34.2* 30.1*  MCV 96.5  --   --   --  97.1 99.1 96.2  PLT 320  --   --   --  380 329 287   < > = values in this interval not displayed.   Recent Labs    06/29/20 1749  COLORURINE YELLOW  LABSPEC 1.044*  PHURINE 5.0  GLUCOSEU NEGATIVE  HGBUR MODERATE*  BILIRUBINUR NEGATIVE  KETONESUR NEGATIVE  PROTEINUR NEGATIVE  NITRITE NEGATIVE  LEUKOCYTESUR NEGATIVE       Component Value Date/Time   CHOL 149 06/30/2020 0043   TRIG 73 07/02/2020 0424   HDL 23 (L) 06/30/2020 0043   CHOLHDL 6.5 06/30/2020 0043   VLDL 33 06/30/2020 0043   LDLCALC 93 06/30/2020 0043   Lab Results  Component Value Date   HGBA1C 5.6 06/30/2020      Component Value Date/Time   LABOPIA NONE DETECTED 06/29/2020 1749   COCAINSCRNUR NONE DETECTED 06/29/2020 1749   LABBENZ NONE DETECTED 06/29/2020 1749   AMPHETMU NONE DETECTED 06/29/2020 1749  THCU NONE DETECTED 06/29/2020 1749   LABBARB NONE DETECTED 06/29/2020 1749    Recent Labs  Lab 06/29/20 0904  ETH <10    I have personally reviewed the radiological images below and agree with the radiology interpretations.  CT Code Stroke CTA Head W/WO contrast  Result Date: 06/29/2020 CLINICAL DATA:  Hyperdense vessel and stroke-like symptoms EXAM: CT ANGIOGRAPHY HEAD AND NECK CT PERFUSION BRAIN TECHNIQUE: Multidetector CT imaging of the head and neck was performed using the standard protocol during bolus administration of intravenous contrast. Multiplanar CT image reconstructions and MIPs were obtained to evaluate the vascular anatomy. Carotid stenosis measurements (when applicable) are obtained utilizing NASCET criteria, using the distal internal carotid diameter as the denominator. Multiphase CT imaging of the brain was performed following IV bolus contrast injection. Subsequent parametric perfusion maps were calculated using RAPID software. CONTRAST:  Dose is not yet known COMPARISON:  None. FINDINGS: CTA NECK FINDINGS Aortic arch: Mild atheromatous  changes. Three vessel branching. No acute finding. Right carotid system: Atherosclerotic plaque about the bifurcation with occluded ICA bulb. No flow seen within the ICA in the neck. Left carotid system: Atheromatous plaque at the bifurcation and bulb. No stenosis or ulceration. Negative for beading. Vertebral arteries: Low-density plaque at the left subclavian origin. No flow limiting subclavian stenosis. Plaque at the left vertebral origin without stenosis, beading, or dissection. Skeleton: Focal advanced C5-6 disc degeneration with ridging causing biforaminal impingement. Ordinary facet osteoarthritis. Other neck: No acute or aggressive finding. Right-sided chronic sinusitis with pattern middle meatus obstruction. Upper chest: Granulomatous nodal calcifications. There is also a calcified granuloma appearance in the left upper lobe. Review of the MIP images confirms the above findings CTA HEAD FINDINGS Anterior circulation: No flow seen in the right carotid or MCA branches. There is distal M2 M3 branch reconstitution. Calcified plaque along the left carotid siphon. No MCA branch occlusion, aneurysm, or beading. Posterior circulation: The vertebral and basilar arteries are smooth and widely patent. High-grade atheromatous narrowings of the bilateral PCA, P3 segment on the right and upper first order branch on the left. Venous sinuses: Patent as permitted by contrast timing Anatomic variants: None significant Review of the MIP images confirms the above findings CT Brain Perfusion Findings: ASPECTS: 9 CBF (<30%) Volume: 69mL Perfusion (Tmax>6.0s) volume: 124mL Mismatch Volume: 123mL Infarction Location:Right basal ganglia and lesser extensive lateral frontal cortex infarct. Critical Value/emergent results were called by telephone at the time of interpretation on 06/29/2020 at 9:37 am to provider ERIC [facility] , who verbally acknowledged these results. IMPRESSION: 1. Emergent large vessel occlusion with no flow seen in  the right internal carotid or proximal MCA. There is a 36 cc core infarct and 142 cc of penumbra by CT perfusion. 2. Atherosclerosis without flow limiting stenosis of the other major vessels. 3. Bilateral high-grade atheromatous PCA narrowings, more proximal on the right. 4. 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 Electronically Signed   By: Monte Fantasia M.D.   On: 06/29/2020 09:53   CT HEAD WO CONTRAST  Result Date: 07/01/2020 CLINICAL DATA:  Follow-up intracranial hemorrhage EXAM: CT HEAD WITHOUT CONTRAST TECHNIQUE: Contiguous axial images were obtained from the base of the skull through the vertex without intravenous contrast. COMPARISON:  CT from 2 days ago FINDINGS: Brain: More contracted and homogeneous hematoma in the right basal ganglia measuring up to 2.8 x 2 cm. Cytotoxic edema from infarct has become apparent in the right basal ganglia and adjacent frontal insular cortex. No interval hemorrhage, hydrocephalus, or midline  shift. Vascular: The right ICA is hyperdense below the skull base, unchanged. Skull: No acute or aggressive finding Sinuses/Orbits: Chronic right maxillary, ethmoid, and frontal sinusitis. Generalized mucosal thickening seen elsewhere, progressed. IMPRESSION: 1. Interval contraction of the right basal ganglia hematoma. 2. Expected evolution of the right frontoinsular infarcts. 3. 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 4. No new abnormality. Electronically Signed   By: Monte Fantasia M.D.   On: 07/01/2020 07:16   CT HEAD WO CONTRAST  Result Date: 06/29/2020 CLINICAL DATA:  Follow-up stroke and subsequent intervention. EXAM: CT HEAD WITHOUT CONTRAST TECHNIQUE: Contiguous axial images were obtained from the base of the skull through the vertex without intravenous contrast. COMPARISON:  Earlier same day FINDINGS: Brain: 3.9 x 3.0 x 3.0 cm hyperdense region in the right posterior basal ganglia most consistent with a parenchymal  hematoma, given small internal fluid levels. Mild mass effect and surrounding edema. No midline shift. Chronic ischemic changes in the right posterior frontal region as seen previously. Vascular: No other new vascular finding. Skull: Negative Sinuses/Orbits: Opacified right maxillary and ethmoid sinuses. Orbits negative. Other: None IMPRESSION: 1. 3.9 x 3.0 x 3.0 cm hyperdense region in the right posterior basal ganglia most consistent with parenchymal hematoma, given small internal fluid levels. Mild mass effect and surrounding edema. No midline shift. 2. Chronic ischemic changes in the right posterior frontal region as seen previously. 3. Opacified right maxillary and ethmoid sinuses. Electronically Signed   By: Nelson Chimes M.D.   On: 06/29/2020 18:48   CT Code Stroke CTA Neck W/WO contrast  Result Date: 06/29/2020 CLINICAL DATA:  Hyperdense vessel and stroke-like symptoms EXAM: CT ANGIOGRAPHY HEAD AND NECK CT PERFUSION BRAIN TECHNIQUE: Multidetector CT imaging of the head and neck was performed using the standard protocol during bolus administration of intravenous contrast. Multiplanar CT image reconstructions and MIPs were obtained to evaluate the vascular anatomy. Carotid stenosis measurements (when applicable) are obtained utilizing NASCET criteria, using the distal internal carotid diameter as the denominator. Multiphase CT imaging of the brain was performed following IV bolus contrast injection. Subsequent parametric perfusion maps were calculated using RAPID software. CONTRAST:  Dose is not yet known COMPARISON:  None. FINDINGS: CTA NECK FINDINGS Aortic arch: Mild atheromatous changes. Three vessel branching. No acute finding. Right carotid system: Atherosclerotic plaque about the bifurcation with occluded ICA bulb. No flow seen within the ICA in the neck. Left carotid system: Atheromatous plaque at the bifurcation and bulb. No stenosis or ulceration. Negative for beading. Vertebral arteries:  Low-density plaque at the left subclavian origin. No flow limiting subclavian stenosis. Plaque at the left vertebral origin without stenosis, beading, or dissection. Skeleton: Focal advanced C5-6 disc degeneration with ridging causing biforaminal impingement. Ordinary facet osteoarthritis. Other neck: No acute or aggressive finding. Right-sided chronic sinusitis with pattern middle meatus obstruction. Upper chest: Granulomatous nodal calcifications. There is also a calcified granuloma appearance in the left upper lobe. Review of the MIP images confirms the above findings CTA HEAD FINDINGS Anterior circulation: No flow seen in the right carotid or MCA branches. There is distal M2 M3 branch reconstitution. Calcified plaque along the left carotid siphon. No MCA branch occlusion, aneurysm, or beading. Posterior circulation: The vertebral and basilar arteries are smooth and widely patent. High-grade atheromatous narrowings of the bilateral PCA, P3 segment on the right and upper first order branch on the left. Venous sinuses: Patent as permitted by contrast timing Anatomic variants: None significant Review of the MIP images confirms the above findings  CT Brain Perfusion Findings: ASPECTS: 9 CBF (<30%) Volume: 70mL Perfusion (Tmax>6.0s) volume: 142mL Mismatch Volume: 140mL Infarction Location:Right basal ganglia and lesser extensive lateral frontal cortex infarct. Critical Value/emergent results were called by telephone at the time of interpretation on 06/29/2020 at 9:37 am to provider ERIC [facility] , who verbally acknowledged these results. IMPRESSION: 1. Emergent large vessel occlusion with no flow seen in the right internal carotid or proximal MCA. There is a 36 cc core infarct and 142 cc of penumbra by CT perfusion. 2. Atherosclerosis without flow limiting stenosis of the other major vessels. 3. Bilateral high-grade atheromatous PCA narrowings, more proximal on the right. 4. Incidental chronic right-sided sinusitis from  middle meatus obstruction. Electronically Signed   By: Monte Fantasia M.D.   On: 06/29/2020 09:53   MR BRAIN WO CONTRAST  Result Date: 06/30/2020 CLINICAL DATA:  Stroke follow-up EXAM: MRI HEAD WITHOUT CONTRAST TECHNIQUE: Multiplanar, multiecho pulse sequences of the brain and surrounding structures were obtained without intravenous contrast. COMPARISON:  Head CT and CTA from yesterday FINDINGS: Brain: Acute infarction at the right stratum and patchy along the insula and lateral frontal cortex. The area of infarction appears similar to the area of pre treatment core infarct. Hematoma centered at the right putamen with dimensions better assessed by prior CT, grossly similar at to 3.5 cm anterior to posterior. Correlating to fluid fluid level, there is a posterior dense clot appearance and more heterogeneous anterior component. On gradient imaging petechial hemorrhage is seen along the right MCA watershed. No hydrocephalus or shift. Remote high right frontal cortex infarct anteriorly. Vascular: No flow voids seen in the left ICA beginning in the upper cervical spine. There is normalization by the supraclinoid segment. Skull and upper cervical spine: No focal marrow lesion Sinuses/Orbits: Right middle meatus obstruction with frontal, ethmoid, and especially maxillary sinusitis on the right. IMPRESSION: 1. Acute infarct at the right basal ganglia with nonprogressive hemorrhage when correlated with prior CT. Less extensive patchy acute cortical infarct in the right MCA territory. The areas of acute infarction correlate well with pretreatment CTP. 2. 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 Electronically Signed   By: Monte Fantasia M.D.   On: 06/30/2020 04:22   IR INTRAVSC STENT CERV CAROTID W/O EMB-PROT MOD SED  Result Date: 07/02/2020 INDICATION: New onset right gaze deviation, left hemiplegia, and left-sided neglect. EXAM: 1. EMERGENT LARGE VESSEL OCCLUSION  THROMBOLYSIS anterior CIRCULATION) COMPARISON:  CT angiogram of the head and neck of June 29, 2020. MEDICATIONS: Ancef 2 g IV was administered within 1 hour of the procedure. ANESTHESIA/SEDATION: General anesthesia CONTRAST:  Isovue 300 approximately 165 mL FLUOROSCOPY TIME:  Fluoroscopy Time: 97 minutes 0 seconds (3622 mGy). COMPLICATIONS: None immediate. TECHNIQUE: Following a full explanation of the procedure along with the potential associated complications, an informed witnessed consent was obtained from the spouse. The risks of intracranial hemorrhage of 10%, worsening neurological deficit, ventilator dependency, death and inability to revascularize were all reviewed in detail with the patient's spouse. The patient was then put under general anesthesia by the Department of Anesthesiology at [facility]. The right groin was prepped and draped in the usual sterile fashion. Thereafter using modified Seldinger technique, transfemoral access into the right common femoral artery was obtained without difficulty. Over a 0.035 inch guidewire an 8 French 25 Pinnacle sheath was inserted. Through this, and also over a 0.035 inch guidewire a 5 French 125 cm Berenstein select inside of the balloon guide catheter  combinationwas advanced to the aortic arch region and selectively positioned in the distal right common carotid artery. Guidewire, and the select catheter removed. Good aspiration obtained from the hub of the balloon guide catheter in the right common carotid artery. Arteriogram was then performed centered extra cranially and intracranially. FINDINGS: The right common carotid arteriogram demonstrates the right external carotid artery and its major branches to be widely patent. The right internal carotid artery at the bulb demonstrates a moderate sized plaque associated with complete occlusion just distal to the bulb. No evidence of distal angiographic string sign, or reconstitution of right internal  carotid artery in the cavernous segment was noted. PROCEDURE: Through the balloon guide catheter, a combination of an 021 Trevo ProVue microcatheter over a 0.014 inch standard Synchro micro guidewire with a J configuration, access through the occluded right internal carotid artery was obtained with the micro guidewire leading followed by the microcatheter. The combination was advanced to the horizontal petrous segment where the micro guidewire was removed. Poor aspiration was obtained from the microcatheter hub on account of the extensive clot in the entire right internal carotid artery intracranially and extra cranially. The microcatheter was then exchanged for a 014 inch Synchro standard 300 cm exchange micro guidewire with a J-tip configuration. A control arteriogram performed through the balloon guide demonstrated minimally improved caliber through the proximal right internal carotid artery. A 4 mm x 30 mm Viatrac 14 angioplasty balloon catheter which had been prepped with 50% contrast and 50% heparinized saline infusion and integrated with 75% contrast and 25% heparinized saline infusion was advanced over the exchange micro guidewire using the rapid exchange technique. The proximal and distal markers were then positioned in the described landing zones. A control inflation was then performed using micro inflation syringe device via micro tubing to 12 atmospheres. This was maintained for about 20 seconds and retrieved proximally. A control arteriogram performed through the balloon guide demonstrated improved caliber in the bulb but there continued be a high-grade stenosis in the proximal right internal carotid artery. This prompted a second angioplasty in the manner as described above again to 12 atmospheres for approximately 20 seconds. The balloon was then deflated and retrieved and removed. A control arteriogram performed through the balloon guide demonstrated significantly improved caliber and flow through  the angioplastied segment with improved flow to the more distal right internal carotid artery. However, there continued to be extensive clot noted within the internal carotid artery at the cranial skull base and the supraclinoid right ICA. An 021 Trevo ProVue catheter inside of a 071 134 cm aspiration catheter, combination was advanced over the exchange micro guidewire to the cavernous segment of the right internal carotid artery. Exchange micro guidewire was replaced with a regular 014 inch standard Synchro micro guidewire with a J configuration. This was then advanced using a torque device through the occluded right middle cerebral artery into one of the inferior division branches in the M2 M3 region followed by the microcatheter. The guidewire was removed. Good aspiration obtained from the hub of the microcatheter. A gentle control arteriogram performed through this demonstrated slow flow through the vessel itself. A 4 mm x 40 mm Solitaire X retrieval device was then advanced to the distal end of the microcatheter. The retriever was deployed in the usual manner. The Zoom aspiration catheter was advanced to the mid right M1 segment. Continuous aspiration was then performed at the hub of the aspiration catheter for 2-1/2 minutes, with proximal flow arrest in the right internal  carotid artery. The retrieval device, the microcatheter and the aspiration catheter were retrieved and removed. Copious amounts of clot were captured into the Tuohy Tipt[place], and also in the retrieval device. Following reversal of flow arrest, a control arteriogram performed through the balloon guide catheter demonstrated now improved flow through the proximal right internal carotid artery extra cranially and intracranially. There is now patency of the right anterior cerebral artery with a stump of the occluded right middle cerebral artery. A second pass was then made again using the above combination. The microcatheter was now accessed into the  superior division of the right middle cerebral artery M2 M3 region over a 0.014 inch standard Synchro micro guidewire. Free aspiration of a blood was obtained at the hub of the microcatheter. A Tiger 21 retrieval device was then advanced to the distal end of the retrieval device. This was then deployed by retrieving the microcatheter. The proximal portion of the retrieval device was within the proximal aspect of the occluded right middle cerebral artery. Free aspiration was then started through the aspiration catheter with the Penumbra aspiration device which was positioned in the distal portion of the occluded clot. The retrieval device was then opened and closed until there was kinking at the proximal portion. The microcatheter was then advanced to the proximal marker on the retrieval device. With constant aspiration continued, the Tiger 21 retrieval device was then removed as constant aspiration was applied at the hub of the aspiration catheter, and also with proximal flow arrest and aspiration at the hub of the balloon guide with a 60 mL syringe. Multiple fragments of clot were obtained within the [place], and also entangled in the retrieval device. With reversal of the proximal flow arrest, a control arteriogram performed through the balloon guide in the right internal carotid artery now demonstrated improved opacification of the right middle cerebral artery to where there was now opacification of the anterior temporal branch. Distal branch remains occluded with no change in anterior cerebral artery. A third pass was now made using an 027 150 cm Marksman microcatheter which was advanced again using the combination of the 071 Zoom aspiration catheter over a 0.014 inch standard Synchro micro guidewire. This combination was advanced into the now the inferior division with the microcatheter advanced into the M2 region of the inferior division. The micro guidewire was removed. Good aspiration obtained from the  hub of the 027 microcatheter. A gentle control arteriogram performed through this now demonstrated free flow into the distal vasculature. The aspiration catheter was engaged into the occluded distal right middle cerebral artery at the origin of the inferior division. Aspiration was now at the hub of the aspiration catheter embedded in the right middle cerebral artery clot and also at the hub of the Marksman catheter for approximately 2 minutes, with proximal flow arrest in the right internal carotid artery and constant aspiration at its hub. The Marksman catheter was then gently retrieved while maintaining aspiration through the Zoom aspiration catheter. The Zoom aspiration catheter remained loft to aspiration. This was then retrieved and removed. Again clots were seen in the aspirate. Control arteriogram performed following reversal of flow arrest in the right internal carotid artery now demonstrated complete revascularization of the right MCA distribution, into the distal distribution achieving a TICI 2c revascularization. The right anterior cerebral artery remained widely patent. Measurements were now performed of the right internal carotid artery proximally, and the right common carotid artery distally at the proposed landing zones for the revascularization stent. An  021 Trevo ProVue microcatheter was advanced to the petrous segment of the right internal carotid artery over a 0.014 inch standard Synchro micro guidewire, and replaced with an 014 inch 300 cm Synchro exchange micro guidewire. Safe position of the tip of the exchange micro guidewire was verified. It was elected to proceed with placement of a 6/8 mm x 40 mm Xact stent across the diseased previously occluded right internal carotid artery proximally. This was retrogradely purged with heparinized saline infusion. Using the rapid exchange technique, the delivery system of the stent was then advanced and positioned covering distally, and also the proximal  portion of the landing zones. Stent was deployed in the usual manner without any difficulty. The delivery system was removed. Good aspiration obtained from the hub of the balloon guide in the right common carotid artery. A control arteriogram performed through this demonstrated excellent positioning and apposition of the stent with now free flow noted into the intracranial right ICA and the right middle and right anterior cerebral arteries being widely patent. Prior to this patient was loaded with 81 mg aspirin, and 180 mg of Brilinta via an orogastric tube. CT scan of the brain was then performed which demonstrated contrast blush of the right basal ganglia, and also to the 3 focal areas of hyperdensity suspicious for micro hemorrhages. 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 Three quarter bolus dose of Cangrelor was then given intravenously in order to salvage the occluding stent in the right internal carotid artery. The balloon guide was then removed following removal of the exchange micro guidewire. The diagnostic catheter was then positioned in the left common carotid artery. This demonstrated the left external carotid artery and the left internal carotid arteries to be widely patent. Patency of the left internal carotid to the cranial skull base was verified. Also the left middle and the left anterior cerebral arteries were seen to opacify into the capillary and venous phases. Also almost simultaneously cross-filling via the anterior communicating artery of the right anterior and the right middle cerebral arteries is noted via the anterior communicating artery. No gross filling defects were seen in the middle cerebral artery distribution. 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 The diagnostic catheter was removed. The 8 French Pinnacle sheath was then exchanged for a short 8 [place] sheath which in turn was then removed with the successful application of the 7 [place] ExoSeal closure device with hemostasis in the right groin. Distal pulses remained palpable in the feet bilaterally unchanged. A second CT scan of the brain demonstrated no significant change in the right basal ganglia with continued presence of at least 3 foci of hemorrhage. No mass effect or midline shift was seen. Patient was left intubated on account of patient's medical condition and inability to respond appropriately to protect the airway. Patient was then transferred to the neuro ICU for post recanalization management. IMPRESSION: Status post endovascular complete revascularization of the right middle cerebral artery and the right anterior cerebral artery with 1 pass with a Solitaire 4 mm x 40 mm X retrieval device and aspiration, and 1 pass with the Tiger 21 retrieval device with proximal aspiration, and with 1 pass with contact aspiration achieving a TICI 2c revascularization of the right middle cerebral artery distribution.  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 Attempt to rescue with 3/4 bolus dose of Cangrelor IV. PLAN: Follow-up in the clinic 4 to 6 weeks post discharge. Electronically Signed   By: Luanne Bras M.D.   On: 06/30/2020 13:20   IR CT [facility]  Result Date: 07/02/2020 INDICATION: New onset right gaze deviation, left hemiplegia, and left-sided neglect. EXAM: 1. EMERGENT LARGE VESSEL OCCLUSION THROMBOLYSIS anterior CIRCULATION) COMPARISON:  CT angiogram of the head and neck of June 29, 2020. MEDICATIONS: Ancef 2 g IV was administered within 1  hour of the procedure. ANESTHESIA/SEDATION: General anesthesia CONTRAST:  Isovue 300 approximately 165 mL FLUOROSCOPY TIME:  Fluoroscopy Time: 97 minutes 0 seconds (3622 mGy). COMPLICATIONS: None immediate. TECHNIQUE: Following a full explanation of the procedure along with the potential associated complications, an informed witnessed consent was obtained from the spouse. The risks of intracranial hemorrhage of 10%, worsening neurological deficit, ventilator dependency, death and inability to revascularize were all reviewed in detail with the patient's spouse. The patient was then put under general anesthesia by the Department of Anesthesiology at [facility]. The right groin was prepped and draped in the usual sterile fashion. Thereafter using modified Seldinger technique, transfemoral access into the right common femoral artery was obtained without difficulty. Over a 0.035 inch guidewire an 8 French 25 Pinnacle sheath was inserted. Through this, and also over a 0.035 inch guidewire a 5 French 125 cm Berenstein select inside of the balloon guide catheter combinationwas advanced to the aortic arch region and selectively positioned in the distal right common carotid artery. Guidewire, and the select catheter removed. Good aspiration obtained from the hub of the balloon guide catheter in the right common carotid artery. Arteriogram was then performed centered extra cranially and intracranially. FINDINGS: The right common carotid arteriogram demonstrates the right external carotid artery and its major branches to be widely patent. The right internal carotid artery at the bulb demonstrates a moderate sized plaque associated with complete occlusion just distal to the bulb. No evidence of distal angiographic string sign, or reconstitution of right internal carotid artery in the cavernous segment was noted. PROCEDURE: Through the balloon guide catheter, a combination of an 021 Trevo ProVue microcatheter over a  0.014 inch standard Synchro micro guidewire with a J configuration, access through the occluded right internal carotid artery was obtained with the micro guidewire leading followed by the microcatheter. The combination was advanced to the horizontal petrous segment where the micro guidewire was removed. Poor aspiration was obtained from the microcatheter hub on account of the extensive clot in the entire right internal carotid artery intracranially and extra cranially. The microcatheter was then exchanged for a 014 inch Synchro standard 300 cm exchange micro guidewire with a J-tip configuration. A control arteriogram performed through the balloon guide demonstrated minimally improved caliber through the proximal right internal carotid artery. A 4 mm x 30 mm Viatrac 14 angioplasty balloon catheter which had been prepped with 50% contrast and 50% heparinized saline infusion and integrated with 75% contrast and 25% heparinized saline infusion was advanced over the exchange micro guidewire using the rapid exchange technique. The proximal and distal markers were then positioned in the described landing zones. A control inflation was then performed using micro inflation syringe device via micro tubing to 12 atmospheres. This was maintained for about 20 seconds and retrieved proximally. A control arteriogram performed through the balloon guide  demonstrated improved caliber in the bulb but there continued be a high-grade stenosis in the proximal right internal carotid artery. This prompted a second angioplasty in the manner as described above again to 12 atmospheres for approximately 20 seconds. The balloon was then deflated and retrieved and removed. A control arteriogram performed through the balloon guide demonstrated significantly improved caliber and flow through the angioplastied segment with improved flow to the more distal right internal carotid artery. However, there continued to be extensive clot noted within the  internal carotid artery at the cranial skull base and the supraclinoid right ICA. An 021 Trevo ProVue catheter inside of a 071 134 cm aspiration catheter, combination was advanced over the exchange micro guidewire to the cavernous segment of the right internal carotid artery. Exchange micro guidewire was replaced with a regular 014 inch standard Synchro micro guidewire with a J configuration. This was then advanced using a torque device through the occluded right middle cerebral artery into one of the inferior division branches in the M2 M3 region followed by the microcatheter. The guidewire was removed. Good aspiration obtained from the hub of the microcatheter. A gentle control arteriogram performed through this demonstrated slow flow through the vessel itself. A 4 mm x 40 mm Solitaire X retrieval device was then advanced to the distal end of the microcatheter. The retriever was deployed in the usual manner. The Zoom aspiration catheter was advanced to the mid right M1 segment. Continuous aspiration was then performed at the hub of the aspiration catheter for 2-1/2 minutes, with proximal flow arrest in the right internal carotid artery. The retrieval device, the microcatheter and the aspiration catheter were retrieved and removed. Copious amounts of clot were captured into the Tuohy [place], and also in the retrieval device. Following reversal of flow arrest, a control arteriogram performed through the balloon guide catheter demonstrated now improved flow through the proximal right internal carotid artery extra cranially and intracranially. There is now patency of the right anterior cerebral artery with a stump of the occluded right middle cerebral artery. A second pass was then made again using the above combination. The microcatheter was now accessed into the superior division of the right middle cerebral artery M2 M3 region over a 0.014 inch standard Synchro micro guidewire. Free aspiration of a blood was  obtained at the hub of the microcatheter. A Tiger 21 retrieval device was then advanced to the distal end of the retrieval device. This was then deployed by retrieving the microcatheter. The proximal portion of the retrieval device was within the proximal aspect of the occluded right middle cerebral artery. Free aspiration was then started through the aspiration catheter with the Penumbra aspiration device which was positioned in the distal portion of the occluded clot. The retrieval device was then opened and closed until there was kinking at the proximal portion. The microcatheter was then advanced to the proximal marker on the retrieval device. With constant aspiration continued, the Tiger 21 retrieval device was then removed as constant aspiration was applied at the hub of the aspiration catheter, and also with proximal flow arrest and aspiration at the hub of the balloon guide with a 60 mL syringe. Multiple fragments of clot were obtained within the [place], and also entangled in the retrieval device. With reversal of the proximal flow arrest, a control arteriogram performed through the balloon guide in the right internal carotid artery now demonstrated improved opacification of the right middle cerebral artery to where there was now opacification of the anterior  temporal branch. Distal branch remains occluded with no change in anterior cerebral artery. A third pass was now made using an 027 150 cm Marksman microcatheter which was advanced again using the combination of the 071 Zoom aspiration catheter over a 0.014 inch standard Synchro micro guidewire. This combination was advanced into the now the inferior division with the microcatheter advanced into the M2 region of the inferior division. The micro guidewire was removed. Good aspiration obtained from the hub of the 027 microcatheter. A gentle control arteriogram performed through this now demonstrated free flow into the distal vasculature. The  aspiration catheter was engaged into the occluded distal right middle cerebral artery at the origin of the inferior division. Aspiration was now at the hub of the aspiration catheter embedded in the right middle cerebral artery clot and also at the hub of the Marksman catheter for approximately 2 minutes, with proximal flow arrest in the right internal carotid artery and constant aspiration at its hub. The [place] catheter was then gently retrieved while maintaining aspiration through the Zoom aspiration catheter. The Zoom aspiration catheter remained loft to aspiration. This was then retrieved and removed. Again clots were seen in the aspirate. Control arteriogram performed following reversal of flow arrest in the right internal carotid artery now demonstrated complete revascularization of the right MCA distribution, into the distal distribution achieving a TICI 2c revascularization. The right anterior cerebral artery remained widely patent. Measurements were now performed of the right internal carotid artery proximally, and the right common carotid artery distally at the proposed landing zones for the revascularization stent. An 021 Trevo ProVue microcatheter was advanced to the petrous segment of the right internal carotid artery over a 0.014 inch standard Synchro micro guidewire, and replaced with an 014 inch 300 cm Synchro exchange micro guidewire. Safe position of the tip of the exchange micro guidewire was verified. It was elected to proceed with placement of a 6/8 mm x 40 mm Xact stent across the diseased previously occluded right internal carotid artery proximally. This was retrogradely purged with heparinized saline infusion. Using the rapid exchange technique, the delivery system of the stent was then advanced and positioned covering distally, and also the proximal portion of the landing zones. Stent was deployed in the usual manner without any difficulty. The delivery system was removed. Good aspiration  obtained from the hub of the balloon guide in the right common carotid artery. A control arteriogram performed through this demonstrated excellent positioning and apposition of the stent with now free flow noted into the intracranial right ICA and the right middle and right anterior cerebral arteries being widely patent. Prior to this patient was loaded with 81 mg aspirin, and 180 mg of Brilinta via an orogastric tube. CT scan of the brain was then performed which demonstrated contrast blush of the right basal ganglia, and also to the 3 focal areas of hyperdensity suspicious for micro hemorrhages. 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 Three quarter bolus dose of Cangrelor was then given intravenously in order to salvage the occluding stent in the right internal carotid artery. The balloon guide was then removed following removal of the exchange micro guidewire. The diagnostic catheter was then positioned in the left common carotid artery. This demonstrated the left external carotid artery and the left internal carotid arteries to be widely patent. Patency of the left internal  carotid to the cranial skull base was verified. Also the left middle and the left anterior cerebral arteries were seen to opacify into the capillary and venous phases. Also almost simultaneously cross-filling via the anterior communicating artery of the right anterior and the right middle cerebral arteries is noted via the anterior communicating artery. No gross filling defects were seen in the middle cerebral artery distribution. 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 The diagnostic catheter was removed. The 8 French  Pinnacle sheath was then exchanged for a short 8 [place] sheath which in turn was then removed with the successful application of the 7 [place] ExoSeal closure device with hemostasis in the right groin. Distal pulses remained palpable in the feet bilaterally unchanged. A second CT scan of the brain demonstrated no significant change in the right basal ganglia with continued presence of at least 3 foci of hemorrhage. No mass effect or midline shift was seen. Patient was left intubated on account of patient's medical condition and inability to respond appropriately to protect the airway. Patient was then transferred to the neuro ICU for post recanalization management. IMPRESSION: Status post endovascular complete revascularization of the right middle cerebral artery and the right anterior cerebral artery with 1 pass with a Solitaire 4 mm x 40 mm X retrieval device and aspiration, and 1 pass with the Tiger 21 retrieval device with proximal aspiration, and with 1 pass with contact aspiration achieving a TICI 2c revascularization of the right middle cerebral artery distribution. 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 Attempt to rescue with 3/4 bolus dose of Cangrelor IV. PLAN: Follow-up in the clinic 4 to 6 weeks post discharge. Electronically Signed   By: Luanne Bras M.D.   On: 06/30/2020 13:20   IR CT Head Ltd  Result Date: 07/02/2020 INDICATION: New onset right gaze deviation, left hemiplegia, and left-sided neglect. EXAM: 1. EMERGENT LARGE VESSEL OCCLUSION THROMBOLYSIS anterior CIRCULATION) COMPARISON:  CT angiogram of the head and neck of June 29, 2020. MEDICATIONS: Ancef 2 g IV was administered within 1 hour of the procedure. ANESTHESIA/SEDATION: General anesthesia CONTRAST:  Isovue 300 approximately 165 mL FLUOROSCOPY TIME:  Fluoroscopy  Time: 97 minutes 0 seconds (3622 mGy). COMPLICATIONS: None immediate. TECHNIQUE: Following a full explanation of the procedure along with the potential associated complications, an informed witnessed consent was obtained from the spouse. The risks of intracranial hemorrhage of 10%, worsening neurological deficit, ventilator dependency, death and inability to revascularize were all reviewed in detail with the patient's spouse. The patient was then put under general anesthesia by the Department of Anesthesiology at [facility]. The right groin was prepped and draped in the usual sterile fashion. Thereafter using modified Seldinger technique, transfemoral access into the right common femoral artery was obtained without difficulty. Over a 0.035 inch guidewire an 8 French 25 Pinnacle sheath was inserted. Through this, and also over a 0.035 inch guidewire a 5 French 125 cm Berenstein select inside of the balloon guide catheter combinationwas advanced to the aortic arch region and selectively positioned in the distal right common carotid artery. Guidewire, and the select catheter removed. Good aspiration obtained from the hub of the balloon guide catheter in the right common carotid artery. Arteriogram was then performed centered extra cranially and intracranially. FINDINGS: The right common carotid arteriogram demonstrates the right  external carotid artery and its major branches to be widely patent. The right internal carotid artery at the bulb demonstrates a moderate sized plaque associated with complete occlusion just distal to the bulb. No evidence of distal angiographic string sign, or reconstitution of right internal carotid artery in the cavernous segment was noted. PROCEDURE: Through the balloon guide catheter, a combination of an 021 Trevo ProVue microcatheter over a 0.014 inch standard Synchro micro guidewire with a J configuration, access through the occluded right internal carotid artery was obtained  with the micro guidewire leading followed by the microcatheter. The combination was advanced to the horizontal petrous segment where the micro guidewire was removed. Poor aspiration was obtained from the microcatheter hub on account of the extensive clot in the entire right internal carotid artery intracranially and extra cranially. The microcatheter was then exchanged for a 014 inch Synchro standard 300 cm exchange micro guidewire with a J-tip configuration. A control arteriogram performed through the balloon guide demonstrated minimally improved caliber through the proximal right internal carotid artery. A 4 mm x 30 mm Viatrac 14 angioplasty balloon catheter which had been prepped with 50% contrast and 50% heparinized saline infusion and integrated with 75% contrast and 25% heparinized saline infusion was advanced over the exchange micro guidewire using the rapid exchange technique. The proximal and distal markers were then positioned in the described landing zones. A control inflation was then performed using micro inflation syringe device via micro tubing to 12 atmospheres. This was maintained for about 20 seconds and retrieved proximally. A control arteriogram performed through the balloon guide demonstrated improved caliber in the bulb but there continued be a high-grade stenosis in the proximal right internal carotid artery. This prompted a second angioplasty in the manner as described above again to 12 atmospheres for approximately 20 seconds. The balloon was then deflated and retrieved and removed. A control arteriogram performed through the balloon guide demonstrated significantly improved caliber and flow through the angioplastied segment with improved flow to the more distal right internal carotid artery. However, there continued to be extensive clot noted within the internal carotid artery at the cranial skull base and the supraclinoid right ICA. An 021 Trevo ProVue catheter inside of a 071 134 cm  aspiration catheter, combination was advanced over the exchange micro guidewire to the cavernous segment of the right internal carotid artery. Exchange micro guidewire was replaced with a regular 014 inch standard Synchro micro guidewire with a J configuration. This was then advanced using a torque device through the occluded right middle cerebral artery into one of the inferior division branches in the M2 M3 region followed by the microcatheter. The guidewire was removed. Good aspiration obtained from the hub of the microcatheter. A gentle control arteriogram performed through this demonstrated slow flow through the vessel itself. A 4 mm x 40 mm Solitaire X retrieval device was then advanced to the distal end of the microcatheter. The retriever was deployed in the usual manner. The Zoom aspiration catheter was advanced to the mid right M1 segment. Continuous aspiration was then performed at the hub of the aspiration catheter for 2-1/2 minutes, with proximal flow arrest in the right internal carotid artery. The retrieval device, the microcatheter and the aspiration catheter were retrieved and removed. Copious amounts of clot were captured into the Tu[place], and also in the retrieval device. Following reversal of flow arrest, a control arteriogram performed through the balloon guide catheter demonstrated now improved flow through the proximal right internal carotid artery extra cranially  and intracranially. There is now patency of the right anterior cerebral artery with a stump of the occluded right middle cerebral artery. A second pass was then made again using the above combination. The microcatheter was now accessed into the superior division of the right middle cerebral artery M2 M3 region over a 0.014 inch standard Synchro micro guidewire. Free aspiration of a blood was obtained at the hub of the microcatheter. A Tiger 21 retrieval device was then advanced to the distal end of the retrieval device. This was  then deployed by retrieving the microcatheter. The proximal portion of the retrieval device was within the proximal aspect of the occluded right middle cerebral artery. Free aspiration was then started through the aspiration catheter with the Penumbra aspiration device which was positioned in the distal portion of the occluded clot. The retrieval device was then opened and closed until there was kinking at the proximal portion. The microcatheter was then advanced to the proximal marker on the retrieval device. With constant aspiration continued, the Tiger 21 retrieval device was then removed as constant aspiration was applied at the hub of the aspiration catheter, and also with proximal flow arrest and aspiration at the hub of the balloon guide with a 60 mL syringe. Multiple fragments of clot were obtained within the [place], and also entangled in the retrieval device. With reversal of the proximal flow arrest, a control arteriogram performed through the balloon guide in the right internal carotid artery now demonstrated improved opacification of the right middle cerebral artery to where there was now opacification of the anterior temporal branch. Distal branch remains occluded with no change in anterior cerebral artery. A third pass was now made using an 027 150 cm Marksman microcatheter which was advanced again using the combination of the 071 Zoom aspiration catheter over a 0.014 inch standard Synchro micro guidewire. This combination was advanced into the now the inferior division with the microcatheter advanced into the M2 region of the inferior division. The micro guidewire was removed. Good aspiration obtained from the hub of the 027 microcatheter. A gentle control arteriogram performed through this now demonstrated free flow into the distal vasculature. The aspiration catheter was engaged into the occluded distal right middle cerebral artery at the origin of the inferior division. Aspiration was now at  the hub of the aspiration catheter embedded in the right middle cerebral artery clot and also at the hub of the Marksman catheter for approximately 2 minutes, with proximal flow arrest in the right internal carotid artery and constant aspiration at its hub. The Marksman catheter was then gently retrieved while maintaining aspiration through the Zoom aspiration catheter. The Zoom aspiration catheter remained loft to aspiration. This was then retrieved and removed. Again clots were seen in the aspirate. Control arteriogram performed following reversal of flow arrest in the right internal carotid artery now demonstrated complete revascularization of the right MCA distribution, into the distal distribution achieving a TICI 2c revascularization. The right anterior cerebral artery remained widely patent. Measurements were now performed of the right internal carotid artery proximally, and the right common carotid artery distally at the proposed landing zones for the revascularization stent. An 021 Trevo ProVue microcatheter was advanced to the petrous segment of the right internal carotid artery over a 0.014 inch standard Synchro micro guidewire, and replaced with an 014 inch 300 cm Synchro exchange micro guidewire. Safe position of the tip of the exchange micro guidewire was verified. It was elected to proceed with placement of a 6/8  mm x 40 mm Xact stent across the diseased previously occluded right internal carotid artery proximally. This was retrogradely purged with heparinized saline infusion. Using the rapid exchange technique, the delivery system of the stent was then advanced and positioned covering distally, and also the proximal portion of the landing zones. Stent was deployed in the usual manner without any difficulty. The delivery system was removed. Good aspiration obtained from the hub of the balloon guide in the right common carotid artery. A control arteriogram performed through this demonstrated excellent  positioning and apposition of the stent with now free flow noted into the intracranial right ICA and the right middle and right anterior cerebral arteries being widely patent. Prior to this patient was loaded with 81 mg aspirin, and 180 mg of Brilinta via an orogastric tube. CT scan of the brain was then performed which demonstrated contrast blush of the right basal ganglia, and also to the 3 focal areas of hyperdensity suspicious for micro hemorrhages. 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 Three quarter bolus dose of Cangrelor was then given intravenously in order to salvage the occluding stent in the right internal carotid artery. The balloon guide was then removed following removal of the exchange micro guidewire. The diagnostic catheter was then positioned in the left common carotid artery. This demonstrated the left external carotid artery and the left internal carotid arteries to be widely patent. Patency of the left internal carotid to the cranial skull base was verified. Also the left middle and the left anterior cerebral arteries were seen to opacify into the capillary and venous phases. Also almost simultaneously cross-filling via the anterior communicating artery of the right anterior and the right middle cerebral arteries is noted via the anterior communicating artery. No gross filling defects were seen in the middle cerebral artery distribution. 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 The diagnostic catheter was removed. The 8 French Pinnacle sheath was then exchanged for a short 8 [place] sheath which in turn was then removed with the successful application of the 7 [place] ExoSeal  closure device with hemostasis in the right groin. Distal pulses remained palpable in the feet bilaterally unchanged. A second CT scan of the brain demonstrated no significant change in the right basal ganglia with continued presence of at least 3 foci of hemorrhage. No mass effect or midline shift was seen. Patient was left intubated on account of patient's medical condition and inability to respond appropriately to protect the airway. Patient was then transferred to the neuro ICU for post recanalization management. IMPRESSION: Status post endovascular complete revascularization of the right middle cerebral artery and the right anterior cerebral artery with 1 pass with a Solitaire 4 mm x 40 mm X retrieval device and aspiration, and 1 pass with the Tiger 21 retrieval device with proximal aspiration, and with 1 pass with contact aspiration achieving a TICI 2c revascularization of the right middle cerebral artery distribution. 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 Attempt to rescue with 3/4 bolus dose of Cangrelor IV. PLAN: Follow-up in the clinic 4 to 6 weeks post discharge. Electronically Signed   By: Luanne Bras M.D.   On:  06/30/2020 13:20   CT Code Stroke Cerebral Perfusion with contrast  Result Date: 06/29/2020 CLINICAL DATA:  Hyperdense vessel and stroke-like symptoms EXAM: CT ANGIOGRAPHY HEAD AND NECK CT PERFUSION BRAIN TECHNIQUE: Multidetector CT imaging of the head and neck was performed using the standard protocol during bolus administration of intravenous contrast. Multiplanar CT image reconstructions and MIPs were obtained to evaluate the vascular anatomy. Carotid stenosis measurements (when applicable) are obtained utilizing NASCET criteria, using the distal internal carotid diameter as the denominator. Multiphase CT imaging  of the brain was performed following IV bolus contrast injection. Subsequent parametric perfusion maps were calculated using RAPID software. CONTRAST:  Dose is not yet known COMPARISON:  None. FINDINGS: CTA NECK FINDINGS Aortic arch: Mild atheromatous changes. Three vessel branching. No acute finding. Right carotid system: Atherosclerotic plaque about the bifurcation with occluded ICA bulb. No flow seen within the ICA in the neck. Left carotid system: Atheromatous plaque at the bifurcation and bulb. No stenosis or ulceration. Negative for beading. Vertebral arteries: Low-density plaque at the left subclavian origin. No flow limiting subclavian stenosis. Plaque at the left vertebral origin without stenosis, beading, or dissection. Skeleton: Focal advanced C5-6 disc degeneration with ridging causing biforaminal impingement. Ordinary facet osteoarthritis. Other neck: No acute or aggressive finding. Right-sided chronic sinusitis with pattern middle meatus obstruction. Upper chest: Granulomatous nodal calcifications. There is also a calcified granuloma appearance in the left upper lobe. Review of the MIP images confirms the above findings CTA HEAD FINDINGS Anterior circulation: No flow seen in the right carotid or MCA branches. There is distal M2 M3 branch reconstitution. Calcified plaque along the left carotid siphon. No MCA branch occlusion, aneurysm, or beading. Posterior circulation: The vertebral and basilar arteries are smooth and widely patent. High-grade atheromatous narrowings of the bilateral PCA, P3 segment on the right and upper first order branch on the left. Venous sinuses: Patent as permitted by contrast timing Anatomic variants: None significant Review of the MIP images confirms the above findings CT Brain Perfusion Findings: ASPECTS: 9 CBF (<30%) Volume: 58mL Perfusion (Tmax>6.0s) volume: 119mL Mismatch Volume: 16mL Infarction Location:Right basal ganglia and lesser extensive lateral frontal cortex  infarct. Critical Value/emergent results were called by telephone at the time of interpretation on 06/29/2020 at 9:37 am to provider ERIC [facility] , who verbally acknowledged these results. IMPRESSION: 1. Emergent large vessel occlusion with no flow seen in the right internal carotid or proximal MCA. There is a 36 cc core infarct and 142 cc of penumbra by CT perfusion. 2. Atherosclerosis without flow limiting stenosis of the other major vessels. 3. Bilateral high-grade atheromatous PCA narrowings, more proximal on the right. 4. 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 Electronically Signed   By: Monte Fantasia M.D.   On: 06/29/2020 09:53   Portable Chest xray  Result Date: 06/30/2020 CLINICAL DATA:  Respiratory failure.  Stroke. EXAM: PORTABLE CHEST 1 VIEW COMPARISON:  06/29/2020 FINDINGS: 0538 hours. Endotracheal tube tip is 7.4 cm above the base of the carina. The lungs are clear without focal pneumonia, edema, pneumothorax or pleural effusion. The cardiopericardial silhouette is within normal limits for size. The visualized bony structures of the thorax show no acute abnormality. Telemetry leads overlie the chest. IMPRESSION: No acute cardiopulmonary findings. Electronically Signed   By: Misty Stanley M.D.   On: 06/30/2020 07:30   DG CHEST PORT 1 VIEW  Result Date: 06/29/2020 CLINICAL DATA:  Code stroke with coiling intubated EXAM: PORTABLE CHEST 1 VIEW COMPARISON:  None. FINDINGS: Endotracheal tube tip  is about 4.2 cm superior to the carina. Esophageal tube tip is below the diaphragm but incompletely visualized. No focal opacity or pleural effusion. Normal cardiac size. No pneumothorax. IMPRESSION: Endotracheal tube tip about 4.2 cm superior to the carina. Lungs grossly clear Electronically Signed   By: Donavan Foil M.D.   On: 06/29/2020 16:00   DG Swallowing Func-Speech Pathology  Result Date: 07/01/2020 Objective Swallowing Evaluation: Type of Study: Bedside Swallow  Evaluation  Patient Details Name: MILBERN DOESCHER MRN: [id number] Date of Birth: [birth year] Today's Date: 07/01/2020 Time: SLP Start Time (ACUTE ONLY): 1252 -SLP Stop Time (ACUTE ONLY): 1310 SLP Time Calculation (min) (ACUTE ONLY): 18 min Past Medical History: No past medical history on file. Past Surgical History: Past Surgical History: Procedure Laterality Date . RADIOLOGY WITH ANESTHESIA N/A 06/29/2020  Procedure: IR WITH ANESTHESIA;  Surgeon: Radiologist, Medication, MD;  Location: [facility];  Service: Radiology;  Laterality: N/A; HPI: Patient is a 69 y/o male who presents with left sided weakness, right gaze and slurred speech. NIH:17. Head CT- dense Right MCA infarct. CTA- Right ICA and MCA occlusions. s/p right MCA thrombectomy and right ICA stent placement 06/29/20 with post procedure hemmorhage. ETT 10/4-10/5. No PMH.  Subjective: alert, cooperative Assessment / Plan / Recommendation CHL IP CLINICAL IMPRESSIONS 07/01/2020 Clinical Impression Pt has an oral more than pharyngeal dysphagia, with oral preparation and transit impacted by reduced strength and suspect sensation as well. He has intermittent trouble getting liquids via cup or straw, with reduced seal and sucking. There is anterior spillage with thin liquids; L buccal pocketing with more solid consistencies and slower posterior transit. Mild premature spillage occurs with thin liquids. He has relatively improved pharyngeal function with only mildly reduced base of tongue retraction that results in trace-mild vallecular residue with purees and mild residue with solids. Recommend starting Dys 1 diet and thin liquids. Will f/u for advancement of solids clinically.  SLP Visit Diagnosis Dysphagia, oropharyngeal phase (R13.12) Attention and concentration deficit following -- Frontal lobe and executive function deficit following -- Impact on safety and function Mild aspiration risk   CHL IP TREATMENT RECOMMENDATION 07/01/2020 Treatment Recommendations Therapy as  outlined in treatment plan below   Prognosis 07/01/2020 Prognosis for Safe Diet Advancement Good Barriers to Reach Goals -- Barriers/Prognosis Comment -- CHL IP DIET RECOMMENDATION 07/01/2020 SLP Diet Recommendations Dysphagia 1 (Puree) solids;Thin liquid Liquid Administration via Straw;Cup Medication Administration Crushed with puree Compensations Minimize environmental distractions;Slow rate;Small sips/bites;Lingual sweep for clearance of pocketing Postural Changes Seated upright at 90 degrees   CHL IP OTHER RECOMMENDATIONS 07/01/2020 Recommended Consults -- Oral Care Recommendations Oral care before and after PO Other Recommendations Have oral suction available   CHL IP FOLLOW UP RECOMMENDATIONS 07/01/2020 Follow up Recommendations Inpatient Rehab   CHL IP FREQUENCY AND DURATION 07/01/2020 Speech Therapy Frequency (ACUTE ONLY) min 2x/week Treatment Duration 2 weeks      CHL IP ORAL PHASE 07/01/2020 Oral Phase Impaired Oral - Pudding Teaspoon -- Oral - Pudding Cup -- Oral - Honey Teaspoon -- Oral - Honey Cup -- Oral - Nectar Teaspoon -- Oral - Nectar Cup -- Oral - Nectar Straw -- Oral - Thin Teaspoon -- Oral - Thin Cup Left anterior bolus loss;Weak lingual manipulation;Decreased bolus cohesion;Premature spillage Oral - Thin Straw Weak lingual manipulation;Decreased bolus cohesion;Premature spillage Oral - Puree Weak lingual manipulation;Decreased bolus cohesion;Premature spillage Oral - Mech Soft Weak lingual manipulation;Decreased bolus cohesion;Premature spillage;Left pocketing in lateral sulci Oral - Regular -- Oral - Multi-Consistency -- Oral - Pill --  Oral Phase - Comment --  CHL IP PHARYNGEAL PHASE 07/01/2020 Pharyngeal Phase Impaired Pharyngeal- Pudding Teaspoon -- Pharyngeal -- Pharyngeal- Pudding Cup -- Pharyngeal -- Pharyngeal- Honey Teaspoon -- Pharyngeal -- Pharyngeal- Honey Cup -- Pharyngeal -- Pharyngeal- Nectar Teaspoon -- Pharyngeal -- Pharyngeal- Nectar Cup -- Pharyngeal -- Pharyngeal- Nectar Straw --  Pharyngeal -- Pharyngeal- Thin Teaspoon -- Pharyngeal -- Pharyngeal- Thin Cup Delayed swallow initiation-pyriform sinuses;Reduced tongue base retraction Pharyngeal -- Pharyngeal- Thin Straw Delayed swallow initiation-pyriform sinuses;Reduced tongue base retraction Pharyngeal -- Pharyngeal- Puree Reduced tongue base retraction;Pharyngeal residue - valleculae Pharyngeal -- Pharyngeal- Mechanical Soft Reduced tongue base retraction;Pharyngeal residue - valleculae Pharyngeal -- Pharyngeal- Regular -- Pharyngeal -- Pharyngeal- Multi-consistency -- Pharyngeal -- Pharyngeal- Pill -- Pharyngeal -- Pharyngeal Comment --  CHL IP CERVICAL ESOPHAGEAL PHASE 07/01/2020 Cervical Esophageal Phase WFL Pudding Teaspoon -- Pudding Cup -- Honey Teaspoon -- Honey Cup -- Nectar Teaspoon -- Nectar Cup -- Nectar Straw -- Thin Teaspoon -- Thin Cup -- Thin Straw -- Puree -- Mechanical Soft -- Regular -- Multi-consistency -- Pill -- Cervical Esophageal Comment -- [name], M.A. [place] Pager [phone] Office [phone] 07/01/2020, 2:21 PM              ECHOCARDIOGRAM COMPLETE  Result Date: 06/30/2020    ECHOCARDIOGRAM REPORT   Patient Name:   EROS MONTOUR Date of Exam: 06/30/2020 Medical Rec #:  [id number]     Height:       74.0 in Accession #:    [id number]    Weight:       187.6 lb Date of Birth:  1950/10/23     BSA:          2.115 m Patient Age:    52 years      BP:           162/65 mmHg Patient Gender: M             HR:           52 bpm. Exam Location:  Inpatient Procedure: 2D Echo, Cardiac Doppler, Color Doppler and Intracardiac            Opacification Agent Indications:    CVA  History:        Patient has no prior history of Echocardiogram examinations.                 Stroke and COPD, Signs/Symptoms:Resp. failure; Risk                 Factors:Hypertension.  Sonographer:    Dustin Flock Referring Phys: [phone] ERIC LINDZEN  Sonographer Comments: Technically difficult study due to poor echo windows  and echo performed with patient supine and on artificial respirator. Image acquisition challenging due to COPD and Image acquisition challenging due to respiratory motion. IMPRESSIONS  1. Left ventricular ejection fraction, by estimation, is 50 to 55%. The left ventricle has low normal function. The left ventricle has no regional wall motion abnormalities. Left ventricular diastolic parameters are consistent with Grade I diastolic dysfunction (impaired relaxation).  2. Right ventricular systolic function is normal. The right ventricular size is normal. There is mildly elevated pulmonary artery systolic pressure.  3. Left atrial size was mildly dilated.  4. The mitral valve is normal in structure. No evidence of mitral valve regurgitation. No evidence of mitral stenosis.  5. The aortic valve is normal in structure. Aortic valve regurgitation is not visualized. No aortic stenosis is present.  6. The inferior vena cava is dilated  in size with <50% respiratory variability, suggesting right atrial pressure of 15 mmHg. FINDINGS  Left Ventricle: Left ventricular ejection fraction, by estimation, is 50 to 55%. The left ventricle has low normal function. The left ventricle has no regional wall motion abnormalities. Definity contrast agent was given IV to delineate the left ventricular endocardial borders. The left ventricular internal cavity size was normal in size. There is no left ventricular hypertrophy. Left ventricular diastolic parameters are consistent with Grade I diastolic dysfunction (impaired relaxation). Indeterminate filling pressures. Right Ventricle: The right ventricular size is normal. No increase in right ventricular wall thickness. Right ventricular systolic function is normal. There is mildly elevated pulmonary artery systolic pressure. The tricuspid regurgitant velocity is 2.43  m/s, and with an assumed right atrial pressure of 15 mmHg, the estimated right ventricular systolic pressure is 65.7 mmHg. Left  Atrium: Left atrial size was mildly dilated. Right Atrium: Right atrial size was normal in size. Pericardium: There is no evidence of pericardial effusion. Mitral Valve: The mitral valve is normal in structure. No evidence of mitral valve regurgitation. No evidence of mitral valve stenosis. Tricuspid Valve: The tricuspid valve is normal in structure. Tricuspid valve regurgitation is trivial. No evidence of tricuspid stenosis. Aortic Valve: The aortic valve is normal in structure. Aortic valve regurgitation is not visualized. No aortic stenosis is present. Pulmonic Valve: The pulmonic valve was normal in structure. Pulmonic valve regurgitation is not visualized. No evidence of pulmonic stenosis. Aorta: The aortic root is normal in size and structure. Venous: The inferior vena cava is dilated in size with less than 50% respiratory variability, suggesting right atrial pressure of 15 mmHg. IAS/Shunts: No atrial level shunt detected by color flow Doppler.  LEFT VENTRICLE PLAX 2D LVIDd:         5.30 cm  Diastology LVIDs:         3.80 cm  LV e' medial:    6.85 cm/s LV PW:         1.30 cm  LV E/e' medial:  10.2 LV IVS:        1.00 cm  LV e' lateral:   12.10 cm/s LVOT diam:     2.10 cm  LV E/e' lateral: 5.8 LV SV:         106 LV SV Index:   50 LVOT Area:     3.46 cm  RIGHT VENTRICLE RV Basal diam:  3.80 cm RV S prime:     7.07 cm/s TAPSE (M-mode): 3.3 cm LEFT ATRIUM           Index       RIGHT ATRIUM           Index LA diam:      4.30 cm 2.03 cm/m  RA Area:     21.00 cm LA Vol (A4C): 45.5 ml 21.51 ml/m RA Volume:   67.30 ml  31.82 ml/m  AORTIC VALVE LVOT Vmax:   150.00 cm/s LVOT Vmean:  90.400 cm/s LVOT VTI:    0.305 m  AORTA Ao Root diam: 3.50 cm MITRAL VALVE               TRICUSPID VALVE MV Area (PHT): 2.80 cm    TR Peak grad:   23.6 mmHg MV Decel Time: 271 msec    TR Vmax:        243.00 cm/s MV E velocity: 69.80 cm/s MV A velocity: 68.60 cm/s  SHUNTS MV E/A ratio:  1.02        Systemic  VTI:  0.30 m                             Systemic Diam: 2.10 cm Skeet Latch MD Electronically signed by Skeet Latch MD Signature Date/Time: 06/30/2020/11:25:18 AM    Final    IR PERCUTANEOUS ART THROMBECTOMY/INFUSION INTRACRANIAL INC DIAG ANGIO  Result Date: 07/02/2020 INDICATION: New onset right gaze deviation, left hemiplegia, and left-sided neglect. EXAM: 1. EMERGENT LARGE VESSEL OCCLUSION THROMBOLYSIS anterior CIRCULATION) COMPARISON:  CT angiogram of the head and neck of June 29, 2020. MEDICATIONS: Ancef 2 g IV was administered within 1 hour of the procedure. ANESTHESIA/SEDATION: General anesthesia CONTRAST:  Isovue 300 approximately 165 mL FLUOROSCOPY TIME:  Fluoroscopy Time: 97 minutes 0 seconds (3622 mGy). COMPLICATIONS: None immediate. TECHNIQUE: Following a full explanation of the procedure along with the potential associated complications, an informed witnessed consent was obtained from the spouse. The risks of intracranial hemorrhage of 10%, worsening neurological deficit, ventilator dependency, death and inability to revascularize were all reviewed in detail with the patient's spouse. The patient was then put under general anesthesia by the Department of Anesthesiology at [facility]. The right groin was prepped and draped in the usual sterile fashion. Thereafter using modified Seldinger technique, transfemoral access into the right common femoral artery was obtained without difficulty. Over a 0.035 inch guidewire an 8 French 25 Pinnacle sheath was inserted. Through this, and also over a 0.035 inch guidewire a 5 French 125 cm Berenstein select inside of the balloon guide catheter combinationwas advanced to the aortic arch region and selectively positioned in the distal right common carotid artery. Guidewire, and the select catheter removed. Good aspiration obtained from the hub of the balloon guide catheter in the right common carotid artery. Arteriogram was then performed centered extra cranially and  intracranially. FINDINGS: The right common carotid arteriogram demonstrates the right external carotid artery and its major branches to be widely patent. The right internal carotid artery at the bulb demonstrates a moderate sized plaque associated with complete occlusion just distal to the bulb. No evidence of distal angiographic string sign, or reconstitution of right internal carotid artery in the cavernous segment was noted. PROCEDURE: Through the balloon guide catheter, a combination of an 021 Trevo ProVue microcatheter over a 0.014 inch standard Synchro micro guidewire with a J configuration, access through the occluded right internal carotid artery was obtained with the micro guidewire leading followed by the microcatheter. The combination was advanced to the horizontal petrous segment where the micro guidewire was removed. Poor aspiration was obtained from the microcatheter hub on account of the extensive clot in the entire right internal carotid artery intracranially and extra cranially. The microcatheter was then exchanged for a 014 inch Synchro standard 300 cm exchange micro guidewire with a J-tip configuration. A control arteriogram performed through the balloon guide demonstrated minimally improved caliber through the proximal right internal carotid artery. A 4 mm x 30 mm Viatrac 14 angioplasty balloon catheter which had been prepped with 50% contrast and 50% heparinized saline infusion and integrated with 75% contrast and 25% heparinized saline infusion was advanced over the exchange micro guidewire using the rapid exchange technique. The proximal and distal markers were then positioned in the described landing zones. A control inflation was then performed using micro inflation syringe device via micro tubing to 12 atmospheres. This was maintained for about 20 seconds and retrieved proximally. A control arteriogram performed through the balloon guide demonstrated improved caliber  in the bulb but there  continued be a high-grade stenosis in the proximal right internal carotid artery. This prompted a second angioplasty in the manner as described above again to 12 atmospheres for approximately 20 seconds. The balloon was then deflated and retrieved and removed. A control arteriogram performed through the balloon guide demonstrated significantly improved caliber and flow through the angioplastied segment with improved flow to the more distal right internal carotid artery. However, there continued to be extensive clot noted within the internal carotid artery at the cranial skull base and the supraclinoid right ICA. An 021 Trevo ProVue catheter inside of a 071 134 cm aspiration catheter, combination was advanced over the exchange micro guidewire to the cavernous segment of the right internal carotid artery. Exchange micro guidewire was replaced with a regular 014 inch standard Synchro micro guidewire with a J configuration. This was then advanced using a torque device through the occluded right middle cerebral artery into one of the inferior division branches in the M2 M3 region followed by the microcatheter. The guidewire was removed. Good aspiration obtained from the hub of the microcatheter. A gentle control arteriogram performed through this demonstrated slow flow through the vessel itself. A 4 mm x 40 mm Solitaire X retrieval device was then advanced to the distal end of the microcatheter. The retriever was deployed in the usual manner. The Zoom aspiration catheter was advanced to the mid right M1 segment. Continuous aspiration was then performed at the hub of the aspiration catheter for 2-1/2 minutes, with proximal flow arrest in the right internal carotid artery. The retrieval device, the microcatheter and the aspiration catheter were retrieved and removed. Copious amounts of clot were captured into the Tuohy Maynard, and also in the retrieval device. Following reversal of flow arrest, a control arteriogram  performed through the balloon guide catheter demonstrated now improved flow through the proximal right internal carotid artery extra cranially and intracranially. There is now patency of the right anterior cerebral artery with a stump of the occluded right middle cerebral artery. A second pass was then made again using the above combination. The microcatheter was now accessed into the superior division of the right middle cerebral artery M2 M3 region over a 0.014 inch standard Synchro micro guidewire. Free aspiration of a blood was obtained at the hub of the microcatheter. A Tiger 21 retrieval device was then advanced to the distal end of the retrieval device. This was then deployed by retrieving the microcatheter. The proximal portion of the retrieval device was within the proximal aspect of the occluded right middle cerebral artery. Free aspiration was then started through the aspiration catheter with the Penumbra aspiration device which was positioned in the distal portion of the occluded clot. The retrieval device was then opened and closed until there was kinking at the proximal portion. The microcatheter was then advanced to the proximal marker on the retrieval device. With constant aspiration continued, the Tiger 21 retrieval device was then removed as constant aspiration was applied at the hub of the aspiration catheter, and also with proximal flow arrest and aspiration at the hub of the balloon guide with a 60 mL syringe. Multiple fragments of clot were obtained within the [place], and also entangled in the retrieval device. With reversal of the proximal flow arrest, a control arteriogram performed through the balloon guide in the right internal carotid artery now demonstrated improved opacification of the right middle cerebral artery to where there was now opacification of the anterior temporal branch. Distal  branch remains occluded with no change in anterior cerebral artery. A third pass was now made  using an 027 150 cm Marksman microcatheter which was advanced again using the combination of the 071 Zoom aspiration catheter over a 0.014 inch standard Synchro micro guidewire. This combination was advanced into the now the inferior division with the microcatheter advanced into the M2 region of the inferior division. The micro guidewire was removed. Good aspiration obtained from the hub of the 027 microcatheter. A gentle control arteriogram performed through this now demonstrated free flow into the distal vasculature. The aspiration catheter was engaged into the occluded distal right middle cerebral artery at the origin of the inferior division. Aspiration was now at the hub of the aspiration catheter embedded in the right middle cerebral artery clot and also at the hub of the Marksman catheter for approximately 2 minutes, with proximal flow arrest in the right internal carotid artery and constant aspiration at its hub. The Marksman catheter was then gently retrieved while maintaining aspiration through the Zoom aspiration catheter. The Zoom aspiration catheter remained loft to aspiration. This was then retrieved and removed. Again clots were seen in the aspirate. Control arteriogram performed following reversal of flow arrest in the right internal carotid artery now demonstrated complete revascularization of the right MCA distribution, into the distal distribution achieving a TICI 2c revascularization. The right anterior cerebral artery remained widely patent. Measurements were now performed of the right internal carotid artery proximally, and the right common carotid artery distally at the proposed landing zones for the revascularization stent. An 021 Trevo ProVue microcatheter was advanced to the petrous segment of the right internal carotid artery over a 0.014 inch standard Synchro micro guidewire, and replaced with an 014 inch 300 cm Synchro exchange micro guidewire. Safe position of the tip of the exchange  micro guidewire was verified. It was elected to proceed with placement of a 6/8 mm x 40 mm Xact stent across the diseased previously occluded right internal carotid artery proximally. This was retrogradely purged with heparinized saline infusion. Using the rapid exchange technique, the delivery system of the stent was then advanced and positioned covering distally, and also the proximal portion of the landing zones. Stent was deployed in the usual manner without any difficulty. The delivery system was removed. Good aspiration obtained from the hub of the balloon guide in the right common carotid artery. A control arteriogram performed through this demonstrated excellent positioning and apposition of the stent with now free flow noted into the intracranial right ICA and the right middle and right anterior cerebral arteries being widely patent. Prior to this patient was loaded with 81 mg aspirin, and 180 mg of Brilinta via an orogastric tube. CT scan of the brain was then performed which demonstrated contrast blush of the right basal ganglia, and also to the 3 focal areas of hyperdensity suspicious for micro hemorrhages. 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 Three quarter bolus dose of Cangrelor was then given intravenously in order to salvage the occluding stent in the right internal carotid artery. The balloon guide was then removed following removal of the exchange micro guidewire. The diagnostic catheter was then positioned in the left common carotid artery. This demonstrated the left external carotid artery and the left internal carotid arteries to be widely patent. Patency of the left internal carotid to the  cranial skull base was verified. Also the left middle and the left anterior cerebral arteries were seen to  opacify into the capillary and venous phases. Also almost simultaneously cross-filling via the anterior communicating artery of the right anterior and the right middle cerebral arteries is noted via the anterior communicating artery. No gross filling defects were seen in the middle cerebral artery distribution. 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 The diagnostic catheter was removed. The 8 French Pinnacle sheath was then exchanged for a short 8 [place] sheath which in turn was then removed with the successful application of the 7 [place] ExoSeal closure device with hemostasis in the right groin. Distal pulses remained palpable in the feet bilaterally unchanged. A second CT scan of the brain demonstrated no significant change in the right basal ganglia with continued presence of at least 3 foci of hemorrhage. No mass effect or midline shift was seen. Patient was left intubated on account of patient's medical condition and inability to respond appropriately to protect the airway. Patient was then transferred to the neuro ICU for post recanalization management. IMPRESSION: Status post endovascular complete revascularization of the right middle cerebral artery and the right anterior cerebral artery with 1 pass with a Solitaire 4 mm x 40 mm X retrieval device and aspiration, and 1 pass with the Tiger 21 retrieval device with proximal aspiration, and with 1 pass with contact aspiration achieving a TICI 2c revascularization of the right middle cerebral artery distribution. 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 Attempt to rescue with 3/4 bolus dose of Cangrelor IV. PLAN: Follow-up in the clinic 4 to 6 weeks post discharge.  Electronically Signed   By: Luanne Bras M.D.   On: 06/30/2020 13:20   CT HEAD CODE STROKE WO CONTRAST  Result Date: 06/29/2020 CLINICAL DATA:  Code stroke.  Slurred speech and left facial droop EXAM: CT HEAD WITHOUT CONTRAST TECHNIQUE: Contiguous axial images were obtained from the base of the skull through the vertex without intravenous contrast. COMPARISON:  None. FINDINGS: Brain: Small remote appearing cortically based infarcts in the superior right frontal lobe. No hemorrhage, hydrocephalus, or masslike finding. Vascular: Hyperdense distal right ICA to MCA branches. Atherosclerotic calcification. Skull: Negative Sinuses/Orbits: Right maxillary, ethmoid, and frontal sinus opacification with sclerotic wall thickening at the maxillary sinus. Other: Critical Value/emergent results were called by telephone at the time of interpretation on 06/29/2020 at 9:23 am to provider Lindzen , who verbally acknowledged these results. ASPECTS [facility] Stroke Program Early CT Score) - Ganglionic level infarction (caudate, lentiform nuclei, internal capsule, insula, M1-M3 cortex): Posterior putamen appears blunted compared to the left. Equivocal for small insular cortex infarct. - Supraganglionic infarction (M4-M6 cortex): 3, when accounting for chronic infarct Total score (0-10 with 10 being normal): 9, when accounting for chronic infarct IMPRESSION: 1. Hyperdense distal right ICA and proximal MCA. 2. Blunted appearance of the posterior right putamen. Chronic right high frontal cortex infarcts. ASPECTS is 9 when excluding the chronic changes. Electronically Signed   By: Monte Fantasia M.D.   On: 06/29/2020 09:25   VAS US CAROTID  Result Date: 07/02/2020 Carotid Arterial Duplex Study Indications:   CVA and Right stent. Other Factors: Rt ica stent- 06/29/20. Performing Technologist: [name] RVS  Examination Guidelines: A complete evaluation includes B-mode imaging, spectral Doppler, color Doppler,  and power  Doppler as needed of all accessible portions of each vessel. Bilateral testing is considered an integral part of a complete examination. Limited examinations for reoccurring indications may be performed as noted.  Right Carotid Findings: +----------+--------+--------+--------+------------------+--------+           PSV cm/sEDV cm/sStenosisPlaque DescriptionComments +----------+--------+--------+--------+------------------+--------+ CCA Prox  101                     heterogenous               +----------+--------+--------+--------+------------------+--------+ CCA Distal57                      heterogenous               +----------+--------+--------+--------+------------------+--------+ ECA       103     6                                          +----------+--------+--------+--------+------------------+--------+ +----------+--------+-------+--------+-------------------+           PSV cm/sEDV cmsDescribeArm Pressure (mmHG) +----------+--------+-------+--------+-------------------+ Subclavian160                                        +----------+--------+-------+--------+-------------------+ +---------+--------+--+--------+--+---------+ VertebralPSV cm/s63EDV cm/s12Antegrade +---------+--------+--+--------+--+---------+  Right Stent(s): +---------------+--+-+--------+++ Prox to Stent  274         +---------------+--+-+--------+++ Proximal Stent 49          +---------------+--+-+--------+++ Mid Stent         occluded +---------------+--+-+--------+++ Distal Stent      occluded +---------------+--+-+--------+++ Distal to Stent   occluded +---------------+--+-+--------+++   Left Carotid Findings: +----------+--------+--------+--------+------------------+--------+           PSV cm/sEDV cm/sStenosisPlaque DescriptionComments +----------+--------+--------+--------+------------------+--------+ CCA Prox  133     18               heterogenous               +----------+--------+--------+--------+------------------+--------+ CCA Distal95      17              heterogenous               +----------+--------+--------+--------+------------------+--------+ ICA Prox  97      22      1-39%   heterogenous               +----------+--------+--------+--------+------------------+--------+ ICA Distal108     36                                         +----------+--------+--------+--------+------------------+--------+ ECA       120     10                                         +----------+--------+--------+--------+------------------+--------+ +----------+--------+--------+--------+-------------------+           PSV cm/sEDV cm/sDescribeArm Pressure (mmHG) +----------+--------+--------+--------+-------------------+ [id number]                                         +----------+--------+--------+--------+-------------------+ +---------+--------+--+--------+--+---------+  VertebralPSV cm/s48EDV cm/s13Antegrade +---------+--------+--+--------+--+---------+   Summary: Right Carotid: ICA stent appears occluded. Left Carotid: Velocities in the left ICA are consistent with a 1-39% stenosis. Vertebrals: Bilateral vertebral arteries demonstrate antegrade flow. *See table(s) above for measurements and observations.  Electronically signed by Antony Contras MD on 07/02/2020 at 8:34:52 AM.    Final    VAS [place] LOWER EXTREMITY VENOUS (DVT)  Result Date: 07/01/2020  Lower Venous DVTStudy Indications: Stroke.  Comparison Study: no prior Performing Technologist: [name] RVS  Examination Guidelines: A complete evaluation includes B-mode imaging, spectral Doppler, color Doppler, and power Doppler as needed of all accessible portions of each vessel. Bilateral testing is considered an integral part of a complete examination. Limited examinations for reoccurring indications may be performed as noted. The reflux portion of  the exam is performed with the patient in reverse Trendelenburg.  +---------+---------------+---------+-----------+----------+--------------+ RIGHT    CompressibilityPhasicitySpontaneityPropertiesThrombus Aging +---------+---------------+---------+-----------+----------+--------------+ CFV      Full           Yes      Yes                                 +---------+---------------+---------+-----------+----------+--------------+ SFJ      Full                                                        +---------+---------------+---------+-----------+----------+--------------+ FV Prox  Full                                                        +---------+---------------+---------+-----------+----------+--------------+ FV Mid   Full                                                        +---------+---------------+---------+-----------+----------+--------------+ FV DistalFull                                                        +---------+---------------+---------+-----------+----------+--------------+ PFV      Full                                                        +---------+---------------+---------+-----------+----------+--------------+ POP      Full           Yes      Yes                                 +---------+---------------+---------+-----------+----------+--------------+ PTV      Full                                                        +---------+---------------+---------+-----------+----------+--------------+  PERO     Full                                                        +---------+---------------+---------+-----------+----------+--------------+   +---------+---------------+---------+-----------+----------+--------------+ LEFT     CompressibilityPhasicitySpontaneityPropertiesThrombus Aging +---------+---------------+---------+-----------+----------+--------------+ CFV      Full           Yes      Yes                                  +---------+---------------+---------+-----------+----------+--------------+ SFJ      Full                                                        +---------+---------------+---------+-----------+----------+--------------+ FV Prox  Full                                                        +---------+---------------+---------+-----------+----------+--------------+ FV Mid   Full                                                        +---------+---------------+---------+-----------+----------+--------------+ FV DistalFull                                                        +---------+---------------+---------+-----------+----------+--------------+ PFV      Full                                                        +---------+---------------+---------+-----------+----------+--------------+ POP      Full           Yes      Yes                                 +---------+---------------+---------+-----------+----------+--------------+ PTV      Full                                                        +---------+---------------+---------+-----------+----------+--------------+     Summary: BILATERAL: - No evidence of deep vein thrombosis seen in the lower extremities, bilaterally. - No evidence of superficial venous thrombosis in the lower extremities, bilaterally. -   *See table(s) above for measurements and observations. Electronically signed by  Harold Barban MD on 07/01/2020 at 9:06:00 PM.    Final    IR ANGIO INTRA EXTRACRAN SEL COM CAROTID INNOMINATE UNI L MOD SED  Result Date: 07/02/2020 INDICATION: New onset right gaze deviation, left hemiplegia, and left-sided neglect. EXAM: 1. EMERGENT LARGE VESSEL OCCLUSION THROMBOLYSIS anterior CIRCULATION) COMPARISON:  CT angiogram of the head and neck of June 29, 2020. MEDICATIONS: Ancef 2 g IV was administered within 1 hour of the procedure. ANESTHESIA/SEDATION: General anesthesia CONTRAST:  Isovue  300 approximately 165 mL FLUOROSCOPY TIME:  Fluoroscopy Time: 97 minutes 0 seconds (3622 mGy). COMPLICATIONS: None immediate. TECHNIQUE: Following a full explanation of the procedure along with the potential associated complications, an informed witnessed consent was obtained from the spouse. The risks of intracranial hemorrhage of 10%, worsening neurological deficit, ventilator dependency, death and inability to revascularize were all reviewed in detail with the patient's spouse. The patient was then put under general anesthesia by the Department of Anesthesiology at [facility] The right groin was prepped and draped in the usual sterile fashion. Thereafter using modified Seldinger technique, transfemoral access into the right common femoral artery was obtained without difficulty. Over a 0.035 inch guidewire an 8 French 25 Pinnacle sheath was inserted. Through this, and also over a 0.035 inch guidewire a 5 French 125 cm Berenstein select inside of the balloon guide catheter combinationwas advanced to the aortic arch region and selectively positioned in the distal right common carotid artery. Guidewire, and the select catheter removed. Good aspiration obtained from the hub of the balloon guide catheter in the right common carotid artery. Arteriogram was then performed centered extra cranially and intracranially. FINDINGS: The right common carotid arteriogram demonstrates the right external carotid artery and its major branches to be widely patent. The right internal carotid artery at the bulb demonstrates a moderate sized plaque associated with complete occlusion just distal to the bulb. No evidence of distal angiographic string sign, or reconstitution of right internal carotid artery in the cavernous segment was noted. PROCEDURE: Through the balloon guide catheter, a combination of an 021 Trevo ProVue microcatheter over a 0.014 inch standard Synchro micro guidewire with a J configuration, access through  the occluded right internal carotid artery was obtained with the micro guidewire leading followed by the microcatheter. The combination was advanced to the horizontal petrous segment where the micro guidewire was removed. Poor aspiration was obtained from the microcatheter hub on account of the extensive clot in the entire right internal carotid artery intracranially and extra cranially. The microcatheter was then exchanged for a 014 inch Synchro standard 300 cm exchange micro guidewire with a J-tip configuration. A control arteriogram performed through the balloon guide demonstrated minimally improved caliber through the proximal right internal carotid artery. A 4 mm x 30 mm Viatrac 14 angioplasty balloon catheter which had been prepped with 50% contrast and 50% heparinized saline infusion and integrated with 75% contrast and 25% heparinized saline infusion was advanced over the exchange micro guidewire using the rapid exchange technique. The proximal and distal markers were then positioned in the described landing zones. A control inflation was then performed using micro inflation syringe device via micro tubing to 12 atmospheres. This was maintained for about 20 seconds and retrieved proximally. A control arteriogram performed through the balloon guide demonstrated improved caliber in the bulb but there continued be a high-grade stenosis in the proximal right internal carotid artery. This prompted a second angioplasty in the manner as described above again to 12 atmospheres for approximately 20 seconds.  The balloon was then deflated and retrieved and removed. A control arteriogram performed through the balloon guide demonstrated significantly improved caliber and flow through the angioplastied segment with improved flow to the more distal right internal carotid artery. However, there continued to be extensive clot noted within the internal carotid artery at the cranial skull base and the supraclinoid right ICA.  An 021 Trevo ProVue catheter inside of a 071 134 cm aspiration catheter, combination was advanced over the exchange micro guidewire to the cavernous segment of the right internal carotid artery. Exchange micro guidewire was replaced with a regular 014 inch standard Synchro micro guidewire with a J configuration. This was then advanced using a torque device through the occluded right middle cerebral artery into one of the inferior division branches in the M2 M3 region followed by the microcatheter. The guidewire was removed. Good aspiration obtained from the hub of the microcatheter. A gentle control arteriogram performed through this demonstrated slow flow through the vessel itself. A 4 mm x 40 mm Solitaire X retrieval device was then advanced to the distal end of the microcatheter. The retriever was deployed in the usual manner. The Zoom aspiration catheter was advanced to the mid right M1 segment. Continuous aspiration was then performed at the hub of the aspiration catheter for 2-1/2 minutes, with proximal flow arrest in the right internal carotid artery. The retrieval device, the microcatheter and the aspiration catheter were retrieved and removed. Copious amounts of clot were captured into the Tuohy [place], and also in the retrieval device. Following reversal of flow arrest, a control arteriogram performed through the balloon guide catheter demonstrated now improved flow through the proximal right internal carotid artery extra cranially and intracranially. There is now patency of the right anterior cerebral artery with a stump of the occluded right middle cerebral artery. A second pass was then made again using the above combination. The microcatheter was now accessed into the superior division of the right middle cerebral artery M2 M3 region over a 0.014 inch standard Synchro micro guidewire. Free aspiration of a blood was obtained at the hub of the microcatheter. A Tiger 21 retrieval device was then advanced  to the distal end of the retrieval device. This was then deployed by retrieving the microcatheter. The proximal portion of the retrieval device was within the proximal aspect of the occluded right middle cerebral artery. Free aspiration was then started through the aspiration catheter with the Penumbra aspiration device which was positioned in the distal portion of the occluded clot. The retrieval device was then opened and closed until there was kinking at the proximal portion. The microcatheter was then advanced to the proximal marker on the retrieval device. With constant aspiration continued, the Tiger 21 retrieval device was then removed as constant aspiration was applied at the hub of the aspiration catheter, and also with proximal flow arrest and aspiration at the hub of the balloon guide with a 60 mL syringe. Multiple fragments of clot were obtained within the Brewer, and also entangled in the retrieval device. With reversal of the proximal flow arrest, a control arteriogram performed through the balloon guide in the right internal carotid artery now demonstrated improved opacification of the right middle cerebral artery to where there was now opacification of the anterior temporal branch. Distal branch remains occluded with no change in anterior cerebral artery. A third pass was now made using an 027 150 cm Marksman microcatheter which was advanced again using the combination of the 071 Zoom aspiration catheter  over a 0.014 inch standard Synchro micro guidewire. This combination was advanced into the now the inferior division with the microcatheter advanced into the M2 region of the inferior division. The micro guidewire was removed. Good aspiration obtained from the hub of the 027 microcatheter. A gentle control arteriogram performed through this now demonstrated free flow into the distal vasculature. The aspiration catheter was engaged into the occluded distal right middle cerebral artery at the  origin of the inferior division. Aspiration was now at the hub of the aspiration catheter embedded in the right middle cerebral artery clot and also at the hub of the Marksman catheter for approximately 2 minutes, with proximal flow arrest in the right internal carotid artery and constant aspiration at its hub. The Marksman catheter was then gently retrieved while maintaining aspiration through the Zoom aspiration catheter. The Zoom aspiration catheter remained loft to aspiration. This was then retrieved and removed. Again clots were seen in the aspirate. Control arteriogram performed following reversal of flow arrest in the right internal carotid artery now demonstrated complete revascularization of the right MCA distribution, into the distal distribution achieving a TICI 2c revascularization. The right anterior cerebral artery remained widely patent. Measurements were now performed of the right internal carotid artery proximally, and the right common carotid artery distally at the proposed landing zones for the revascularization stent. An 021 Trevo ProVue microcatheter was advanced to the petrous segment of the right internal carotid artery over a 0.014 inch standard Synchro micro guidewire, and replaced with an 014 inch 300 cm Synchro exchange micro guidewire. Safe position of the tip of the exchange micro guidewire was verified. It was elected to proceed with placement of a 6/8 mm x 40 mm Xact stent across the diseased previously occluded right internal carotid artery proximally. This was retrogradely purged with heparinized saline infusion. Using the rapid exchange technique, the delivery system of the stent was then advanced and positioned covering distally, and also the proximal portion of the landing zones. Stent was deployed in the usual manner without any difficulty. The delivery system was removed. Good aspiration obtained from the hub of the balloon guide in the right common carotid artery. A control  arteriogram performed through this demonstrated excellent positioning and apposition of the stent with now free flow noted into the intracranial right ICA and the right middle and right anterior cerebral arteries being widely patent. Prior to this patient was loaded with 81 mg aspirin, and 180 mg of Brilinta via an orogastric tube. CT scan of the brain was then performed which demonstrated contrast blush of the right basal ganglia, and also to the 3 focal areas of hyperdensity suspicious for micro hemorrhages. 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 Three quarter bolus dose of Cangrelor was then given intravenously in order to salvage the occluding stent in the right internal carotid artery. The balloon guide was then removed following removal of the exchange micro guidewire. The diagnostic catheter was then positioned in the left common carotid artery. This demonstrated the left external carotid artery and the left internal carotid arteries to be widely patent. Patency of the left internal carotid to the cranial skull base was verified. Also the left middle and the left anterior cerebral arteries were seen to opacify into the capillary and venous phases. Also almost simultaneously cross-filling via the anterior communicating artery of the  right anterior and the right middle cerebral arteries is noted via the anterior communicating artery. No gross filling defects were seen in the middle cerebral artery distribution. 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 The diagnostic catheter was removed. The 8 French Pinnacle sheath was then exchanged for a short 8 [place] sheath which in turn was then removed  with the successful application of the 7 [place] ExoSeal closure device with hemostasis in the right groin. Distal pulses remained palpable in the feet bilaterally unchanged. A second CT scan of the brain demonstrated no significant change in the right basal ganglia with continued presence of at least 3 foci of hemorrhage. No mass effect or midline shift was seen. Patient was left intubated on account of patient's medical condition and inability to respond appropriately to protect the airway. Patient was then transferred to the neuro ICU for post recanalization management. IMPRESSION: Status post endovascular complete revascularization of the right middle cerebral artery and the right anterior cerebral artery with 1 pass with a Solitaire 4 mm x 40 mm X retrieval device and aspiration, and 1 pass with the Tiger 21 retrieval device with proximal aspiration, and with 1 pass with contact aspiration achieving a TICI 2c revascularization of the right middle cerebral artery distribution. 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 Attempt to rescue with 3/4 bolus dose of Cangrelor IV. PLAN: Follow-up in the clinic 4 to 6 weeks post discharge. Electronically Signed   By: Luanne Bras M.D.   On: 06/30/2020 13:20    PHYSICAL EXAM  Temp:  [98.3 F (36.8 C)-100.9 F (38.3 C)] 99.6 F (37.6 C) (10/07 1200) Pulse Rate:  [36-96] 52 (10/07 1300) Resp:  [15-31] 24 (10/07 1300) BP: (75-159)/(41-133) 127/53 (10/07 1300) SpO2:  [89 %-100 %] 99 % (10/07 1300)  General - Well nourished, well developed, not in acute distress.  Ophthalmologic - fundi not visualized due to noncooperation.  Cardiovascular - Regular rate and rhythm.  Neuro - awake alert, eyes open, orientated x 4, no aphasia, able to name and repeat but mild dysarthria, able to follow  simple commands. Eyes in right gaze perference position but able to cross midline and left gaze incomplete, left hemianopia and visual neglect, however, able to track bilaterally, PERRL. Left facial droop and decreased eye closure. RUE and RLE at least 4/5. LUE proximal 2-/5 and distal 0/5, LLE 2/5 proximal and 0/5 distally. DTR 1+ and no babinski. Sensation symmetrical per pt, however, left sensory neglect. Right FTN intact on the right and gait not tested.   ASSESSMENT/PLAN [name] is a 69 y.o. male with history of gout admitted for left-sided weakness, right gaze, left facial droop with fall. No tPA given due to outside window.    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 Evolution R frontoinsular  infarcts. R ICA occluded.  Carotid Doppler confirmed right ICA re-occluded  2D Echo EF 50 to 55%  LE venous Doppler no DVT  LDL 93  HgbA1c 5.6  Off Heparin sq at request of GI given BRBPR, SCDsfor VTE prophylaxis    No antithrombotic prior to admission, now on aspirin 81 mg daily - off Brilinta given GIB and ICA has re-occluded.    Ongoing aggressive stroke risk factor management  Therapy recommendations:  CIR  Disposition:  Pending - CIR once off levophed and bed available  Transfer to floor once stable off levophed  Carotid occlusion  CTA head and neck right ICA and MCA occlusion  S/p carotid stenting due to severe athero but repeat carotid Doppler  confirmed right ICA re-occluded  On ASA   Off brilinta due to LGIB and stent re-occluded  Hypotension and bradycardia Orthostatic hypotension  Likely related to carotid stenting  BP goal 120-160  Had orthostatic episode 10/7 with PT - BP down to 80s on sitting  On Levophed-> turn off -> resumed after orthostatic episode  Cardiology on board  On Midodrine 10 Q8  Add florinef 0.1mg   Long term BP goal 130-150 given occluded right ICA  LGIB  BRBPR 07/02/2020  Hgb 11.7->10.1->10.3  Off Brilinta, sq heparin (aspirin continued)  GI on board  Currently conservative management   Considering colonoscopy if H&H dropping or continued LGIB - if needed, cards recs temp pacing (no permanent d/t likely transient due to carotid stent and potential for bacteremia)  Hyperlipidemia  Home meds: None  LDL 93, goal < 70  Add lipitor 40  Continue statin at discharge  Dysphagia  Passed swallow  On dys 1 and thin liquid  Speech on board  Other Stroke Risk Factors    Other Active Problems  Gout  Hypokalemia 3.6->3.4->3.5 - supplement  Hospital day # 3  This patient is critically ill due to lower GI bleeding, syncope with hypotension, bradycardia, right ICA occlusion status post stent, stent reocclusion, right MCA stroke status post thrombectomy, and at significant risk of neurological worsening, death form recurrent stroke, hemorrhagic conversion, anemia with hypovolemic shock, seizure, cardiac arrest. This patient's care requires constant monitoring of vital signs, hemodynamics, respiratory and cardiac monitoring, review of multiple databases, neurological assessment, discussion with family, other specialists and medical decision making of high complexity. I spent 45 minutes of neurocritical care time in the care of this patient. I had long discussion with wife at bedside, updated pt current condition, treatment plan and potential prognosis, and answered all the questions.  She expressed understanding and appreciation.  I discussed with cardiology Dr. Quentin Ore and GI doctor Armbruster.   Rosalin Hawking, MD PhD Stroke Neurology 07/03/2020 11:34 AM     To contact Stroke Continuity provider, please refer to [email or web address] After hours, contact General Neurology

## 2020-07-02 NOTE — Progress Notes (Signed)
Physical Therapy Treatment Patient Details Name: Tyler Richards MRN: [id number] DOB: March 24, 1951 Today's Date: 07/02/2020    History of Present Illness Patient is a 69 y/o male who presents with left sided weakness, right gaze and slurred speech. NIH:17. Head CT- dense Right MCA infarct. CTA- Right ICA and MCA occlusions. s/p right MCA thrombectomy and right ICA stent placement 06/29/20 with post procedure hemmorhage. No PMH.    PT Comments    Patient with improved sitting balance and upright posture while in sitting today, able to sit with S while R UE holding bed rail and with minguard with R hand on the bed.  Still needing significant assist to attempt standing and pt unable to tolerate today with IV pressors just removed and pt with bradycardia, hypotension and unresponsive after in chair.  Able to recover in trendelenberg so lifted back to bed and MD restarted IV Levophed.  Feel he will progress to tolerate more and be a good candidate for CIR.  PT to continue to follow.    Follow Up Recommendations  CIR;Supervision for mobility/OOB     Equipment Recommendations  Other (comment) (to be assessed)    Recommendations for Other Services       Precautions / Restrictions Precautions Precautions: Fall;Other (comment) Precaution Comments: watch HR, becomes bradycardic, watch BP, can get hypotensive    Mobility  Bed Mobility Overal bed mobility: Needs Assistance Bed Mobility: Rolling;Sidelying to Sit Rolling: Min assist Sidelying to sit: Max assist       General bed mobility comments: able to reach across with R UE to rail once R knee flexed, needed help for guiding legs off bed and lifting trunk  Transfers Overall transfer level: Needs assistance Equipment used: Ambulation equipment used Transfers: Sit to/from [facility] Sit to Stand: Max assist;+2 physical assistance Stand pivot transfers: Total assist       General transfer comment: up to stand initially  without the stedy, assist for lifting and balance as leaning R and R LE not accepting weight so brought in Rover and pt stood with +2 max A positioned to avoid LOB to L and assist to keep upright while transferring to chair, stood last time to get into chair, and Stedy removed, pt then with grunting and eyes rolling back so reclined and lifted his feet and in few moments pt returned to regular breathing with HR back up to 50's (monitor ringing HR in 20's); RN in room and assisted to check BP and called MD, pt lifted to bed via [facility]  Ambulation/Gait                 Stairs             Wheelchair Mobility    Modified Rankin (Stroke Patients Only) Modified Rankin (Stroke Patients Only) Pre-Morbid Rankin Score: No symptoms Modified Rankin: Severe disability     Balance Overall balance assessment: Needs assistance Sitting-balance support: Feet supported Sitting balance-Leahy Scale: Poor Sitting balance - Comments: mod to max A due to L lateral lean, then S while R UE on footboard and able to get head in midline, but would lose focus and return to L lateral lean     Standing balance-Leahy Scale: Zero Standing balance comment: max A of 2 to partially stand                            Cognition Arousal/Alertness: Awake/alert Behavior During Therapy: [facility] for tasks assessed/performed Overall  Cognitive Status: Impaired/Different from baseline Area of Impairment: Safety/judgement;Attention;Following commands;Awareness;Problem solving;Orientation                 Orientation Level: Time;Disoriented to Current Attention Level: Sustained   Following Commands: Follows one step commands with increased time Safety/Judgement: Decreased awareness of safety;Decreased awareness of deficits Awareness: Intellectual Problem Solving: Slow processing;Difficulty sequencing;Decreased initiation;Requires verbal cues;Requires tactile cues        Exercises      General  Comments General comments (skin integrity, edema, etc.): BP in chair initially 84/64 had been 113 SBP seated EOB and HR noted in 60's wtih EOB activity; after in trendelenberg BP 121/76      Pertinent Vitals/Pain Pain Assessment: No/denies pain    Home Living                      Prior Function            PT Goals (current goals can now be found in the care plan section) Progress towards PT goals: Progressing toward goals    Frequency    Min 4X/week      PT Plan Current plan remains appropriate    Co-evaluation              AM-PAC PT "6 Clicks" Mobility   Outcome Measure  Help needed turning from your back to your side while in a flat bed without using bedrails?: A Lot Help needed moving from lying on your back to sitting on the side of a flat bed without using bedrails?: Total Help needed moving to and from a bed to a chair (including a wheelchair)?: Total Help needed standing up from a chair using your arms (e.g., wheelchair or bedside chair)?: Total Help needed to walk in hospital room?: Total Help needed climbing 3-5 steps with a railing? : Total 6 Click Score: 7    End of Session Equipment Utilized During Treatment: Gait belt Activity Tolerance: Treatment limited secondary to medical complications (Comment) (hypotensive, bradicardia and decreased responsiveness) Patient left: in bed;with call bell/phone within reach;with family/visitor present;with nursing/sitter in room   PT Visit Diagnosis: Hemiplegia and hemiparesis;Other abnormalities of gait and mobility (R26.89);Muscle weakness (generalized) (M62.81) Hemiplegia - Right/Left: Left Hemiplegia - dominant/non-dominant: Non-dominant Hemiplegia - caused by: Cerebral infarction     Time: 1059-1140 PT Time Calculation (min) (ACUTE ONLY): 41 min  Charges:  $Therapeutic Activity: 23-37 mins $Neuromuscular Re-education: 8-22 mins                     Magda Kiel, PT Acute Rehabilitation  Services Pager:[phone] Office:[phone] 07/02/2020    [name] 07/02/2020, 1:59 PM

## 2020-07-02 NOTE — Progress Notes (Signed)
Alerted Neuro MD Rory Percy that patient had some new onset confusion.  Patient thinks he is at home and keeps asking for his wife.  There are no other new neuro deficits. I reoriented the patient and will continue to assess and observe.

## 2020-07-02 NOTE — Progress Notes (Signed)
[facility] Progress Note Patient Name: Tyler Richards DOB: 20-Sep-1951 MRN: [id number]   Date of Service  07/02/2020  HPI/Events of Note  Patient has some bright red blood per rectum, no hemodynamic changes, he is on SQ Heparin for DVT prophylaxis.  eICU Interventions  H and H Q 6 hours x 5, monitor rectal bleeding closely, if bleeding persists or significant drop in hemoglobin tonight will consult GI tonight, otherwise a.m GI consultation.     Intervention Category Intermediate Interventions: Other:  [name] 07/02/2020, 9:36 PM

## 2020-07-02 NOTE — Progress Notes (Signed)
Alerted Neuro MD Rory Percy that pt had been having bloody BMs.  Was told to give the brilinta and heparin unless serial HandHs came back low.  Will administer meds and continue to observe.

## 2020-07-02 NOTE — Progress Notes (Signed)
Verbal orders for Maintenance fluids and to restart Levophed. Patient resting, family at bedside

## 2020-07-02 NOTE — Progress Notes (Signed)
LOC correlated with brief period of bradycardia. Strips saved.

## 2020-07-02 NOTE — Progress Notes (Signed)
PT/ OT came to work with patient at 1100. Managed to get patient to edge of bed and standing using the steady. Got patient to chair at 1130, patient became unresponsive with episode of incontinence. BP dropped to 84/64. Patient recovered quickly after placing them in trendelenberg and at 1131 BP was 121/76. MD at bedside

## 2020-07-02 NOTE — Progress Notes (Signed)
Reviewed tele with Dr. Quentin Ore  Pt continues to have intermittent sinus slowing with occasional rates in the upper 20s (for short periods).   Overall hemodynamically stable and would continue to follow for now.   We will continue to follow along.   Tyler Richards [address]" [place], PA-C  07/02/2020 7:27 AM

## 2020-07-02 NOTE — Progress Notes (Addendum)
NAME:  Tyler Richards, MRN:  [id number], DOB:  11/07/1950, LOS: 3 ADMISSION DATE:  06/29/2020, CONSULTATION DATE:  06/29/20 REFERRING MD:  Estanislado Pandy  CHIEF COMPLAINT:  AMS   Brief History   Tyler Richards is a 69 y.o. male who was admitted 10/4 with R MCA CVA s/p revascularization and R ICA stent assisted angioplasty and revascularization. Loaded with aspirin and Brilinta  Past Medical History  has Arterial ischemic stroke, MCA, right, acute ([place]); Stroke (cerebrum) ([place]); Acute encephalopathy; Hypertension; Acute respiratory failure with hypoxia (HCC); and Middle cerebral artery embolism, right on their problem list.  Significant Hospital Events   10/4 > admit.  Consults:  PCCM.  Procedures:  ETT 10/4 > 10/5  Significant Diagnostic Tests:  CT / CTA head 10/4 > LVO R ICA / prox MCA. MRi brain 10/5 Acute infarct at the right basal ganglia with nonprogressive hemorrhage when correlated with prior CT. Less extensive patchy acute cortical infarct in the right MCA territory. The areas of acute infarction correlate well with pretreatment CTP. 2. 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 CT head 10/5 3.9 x 3.0 x 3.0 cm hyperdense region in the right posterior basal ganglia most consistent with parenchymal hematoma, given small internal fluid levels. Mild mass effect and surrounding edema. No idline shift. Chronic ischemic changes in the right posterior frontal region as seen previously. Echo 10/4 >  Left ventricular ejection fraction, by estimation, is 50 to 49%, grade 1 diastolic dysfunction  Micro Data:  COVID 10/4 > neg. Flu 10/4 > neg.  Antimicrobials:  None.   Interim history/subjective:  Patient is much more alert and awake, continue to require low-dose vasopressors to keep systolic goal of 449-675 Intermittently bradycardic into 30s with no symptoms Evaluated by speech and swallow, recommend thin liquid and dysphagia 1 pured  diet  Objective:  Blood pressure (!) 108/48, pulse 64, temperature 99.1 F (37.3 C), temperature source Oral, resp. rate 19, height 6\' 2"  (1.88 m), weight 85.1 kg, SpO2 100 %.        Intake/Output Summary (Last 24 hours) at 07/02/2020 1132 Last data filed at 07/02/2020 9163 Gross per 24 hour  Intake 297.66 ml  Output 875 ml  Net -577.34 ml   Filed Weights   06/29/20 1730  Weight: 85.1 kg    Examination: General: Elderly Caucasian male, sitting on the chair Neuro: Awake, following commands.  Right side 5/5, left lower extremity 2/5, left upper extremity 0/5.  Left facial droop and tongue deviation to the right HEENT: [place]/AT. Sclerae anicteric.  Moist mucous membranes Cardiovascular: RRR, no M/R/G.  Lungs: Clear to auscultation bilaterally, no rhonchi or wheezes Abdomen: BS x 4, soft, NT/ND.  Musculoskeletal: No gross deformities, no edema.  Skin: Intact, warm, no rashes.  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 Acute respiratory insufficiency post procedure, resolved Acute encephalopathy due to current to stroke, improving Severe sinus bradycardia Hypokalemia  Continue aspirin and Brilinta Right ICA remain closed even after stent placement Spoke with stroke team, recommend keeping vasopressor off and maintaining MAP goal 65 now Levophed was turned off, patient is maintaining his map in 68s, with no new neurological symptoms Continue secondary stroke prophylaxis Continue statin Echo showed normal LV systolic function with grade 1 diastolic dysfunction Patient remained on room air, able to maintain O2 sat His mental status is improved, now alert awake oriented x3 Patient remained bradycardic into  30s, telemetry showed sinus bradycardia.  Cardiology is following, recommend watchful waiting Supplement electrolytes and monitor   Addendum: 07/02/2020 11:40 AM Patient was placed out of bed in chair, he  had syncopal episode, his blood pressure systolic was in the 57Q and he was noted to be diaphoretic.  He was started on IV fluid and Levophed was started back on with improvement in blood pressure and his mental status.  Now his blood pressure improved to 469G systolic and his neurological exam is back to baseline. We will continue Levophed and will try to slowly taper it off.   Best Practice:  Diet: Pured diet Pain/Anxiety/Delirium protocol (if indicated): N/A VAP protocol (if indicated): N/A DVT prophylaxis: SCD's. GI prophylaxis: PPI. Glucose control: SSI if glucose consistently > 180. Mobility: Bedrest. Code Status: Full. Family Communication: Per primary. Disposition: Remain in ICU due to change in neurological status  Labs   CBC: Recent Labs  Lab 06/29/20 0904 06/29/20 0904 06/29/20 0915 06/29/20 1738 06/30/20 0043 07/01/20 0433 07/02/20 0424  WBC 9.0  --   --   --  9.7 11.2* 10.5  NEUTROABS 6.8  --   --   --  7.2  --   --   HGB 13.9   < > 14.3 11.9* 12.2* 11.7* 10.1*  HCT 41.2   < > 42.0 35.0* 36.2* 34.2* 30.1*  MCV 96.5  --   --   --  97.1 99.1 96.2  PLT 320  --   --   --  380 329 287   < > = values in this interval not displayed.   Basic Metabolic Panel: Recent Labs  Lab 06/29/20 0904 06/29/20 0904 06/29/20 0915 06/29/20 1738 06/30/20 0043 07/01/20 0433 07/02/20 0424  NA 136   < > 138 138 138 138 135  K 5.0   < > 4.5 3.8 3.9 3.6 3.4*  CL 103  --  105  --  106 106 104  CO2 20*  --   --   --  20* 21* 22  GLUCOSE 104*  --  105*  --  113* 107* 103*  BUN 14  --  17  --  10 8 11   CREATININE 0.87  --  0.70  --  0.89 0.85 0.79  CALCIUM 9.1  --   --   --  8.4* 8.5* 8.4*   < > = values in this interval not displayed.   GFR: Estimated Creatinine Clearance: 99.9 mL/min (by C-G formula based on SCr of 0.79 mg/dL). Recent Labs  Lab 06/29/20 0904 06/30/20 0043 07/01/20 0433 07/02/20 0424  WBC 9.0 9.7 11.2* 10.5   Liver Function Tests: Recent Labs  Lab  06/29/20 0904  AST 36  ALT 17  ALKPHOS 61  BILITOT 1.8*  PROT 6.5  ALBUMIN 3.3*   No results for input(s): LIPASE, AMYLASE in the last 168 hours. No results for input(s): AMMONIA in the last 168 hours. ABG    Component Value Date/Time   PHART 7.380 06/29/2020 1738   PCO2ART 41.3 06/29/2020 1738   PO2ART 252 (H) 06/29/2020 1738   HCO3 24.4 06/29/2020 1738   TCO2 26 06/29/2020 1738   ACIDBASEDEF 1.0 06/29/2020 1738   O2SAT 100.0 06/29/2020 1738    Coagulation Profile: Recent Labs  Lab 06/29/20 0904  INR 1.0   Cardiac Enzymes: No results for input(s): CKTOTAL, CKMB, CKMBINDEX, TROPONINI in the last 168 hours. HbA1C: Hgb A1c MFr Bld  Date/Time Value Ref Range Status  06/30/2020 12:43 AM 5.6 4.8 -  5.6 % Final    Comment:    (NOTE) Pre diabetes:          5.7%-6.4%  Diabetes:              >6.4%  Glycemic control for   <7.0% adults with diabetes    CBG: Recent Labs  Lab 06/29/20 0904  GLUCAP 88    Past medical history  He,  has no past medical history on file.   Surgical History   unknown  Social History   reports that he has never smoked. He has never used smokeless tobacco. He reports previous alcohol use. He reports that he does not use drugs.   Family history   His family history is not on file.   Allergies No Known Allergies   Home meds  Prior to Admission medications   Medication Sig Start Date End Date Taking? Authorizing Provider  acetaminophen (TYLENOL) 325 MG tablet Take 650 mg by mouth every 6 (six) hours as needed for mild pain or headache.   Yes [provider]  aspirin EC 81 MG tablet Take 81 mg by mouth daily as needed for mild pain. Swallow whole.   Yes [provider]     Total critical care time: 35 minutes  Performed by: [place] care time was exclusive of separately billable procedures and treating other patients.   Critical care was necessary to treat or prevent imminent or life-threatening  deterioration.   Critical care was time spent personally by me on the following activities: development of treatment plan with patient and/or surrogate as well as nursing, discussions with consultants, evaluation of patient's response to treatment, examination of patient, obtaining history from patient or surrogate, ordering and performing treatments and interventions, ordering and review of laboratory studies, ordering and review of radiographic studies, pulse oximetry and re-evaluation of patient's condition.   Jacky Kindle MD Critical care physician [facility]  Pager: [phone] Mobile: [phone]

## 2020-07-03 DIAGNOSIS — R001 Bradycardia, unspecified: Secondary | ICD-10-CM

## 2020-07-03 DIAGNOSIS — T457X5A Adverse effect of anticoagulant antagonists, vitamin K and other coagulants, initial encounter: Secondary | ICD-10-CM

## 2020-07-03 DIAGNOSIS — K922 Gastrointestinal hemorrhage, unspecified: Secondary | ICD-10-CM | POA: Diagnosis not present

## 2020-07-03 DIAGNOSIS — R55 Syncope and collapse: Secondary | ICD-10-CM

## 2020-07-03 DIAGNOSIS — I6601 Occlusion and stenosis of right middle cerebral artery: Secondary | ICD-10-CM | POA: Diagnosis not present

## 2020-07-03 DIAGNOSIS — I63511 Cerebral infarction due to unspecified occlusion or stenosis of right middle cerebral artery: Secondary | ICD-10-CM | POA: Diagnosis not present

## 2020-07-03 LAB — BASIC METABOLIC PANEL
Anion gap: 9 (ref 5–15)
BUN: 12 mg/dL (ref 8–23)
CO2: 21 mmol/L — ABNORMAL LOW (ref 22–32)
Calcium: 8.2 mg/dL — ABNORMAL LOW (ref 8.9–10.3)
Chloride: 105 mmol/L (ref 98–111)
Creatinine, Ser: 0.77 mg/dL (ref 0.61–1.24)
GFR calc non Af Amer: >60 mL/min (ref >60)
Glucose, Bld: 102 mg/dL — ABNORMAL HIGH (ref 70–99)
Potassium: 3.5 mmol/L (ref 3.5–5.1)
Sodium: 135 mmol/L (ref 135–145)

## 2020-07-03 LAB — CBC
HCT: 30.6 % — ABNORMAL LOW (ref 39.0–52.0)
Hemoglobin: 10.3 g/dL — ABNORMAL LOW (ref 13.0–17.0)
MCH: 32.4 pg (ref 26.0–34.0)
MCHC: 33.7 g/dL (ref 30.0–36.0)
MCV: 96.2 fL (ref 80.0–100.0)
Platelets: 336 10*3/uL (ref 150–400)
RBC: 3.18 MIL/uL — ABNORMAL LOW (ref 4.22–5.81)
RDW: 12.4 % (ref 11.5–15.5)
WBC: 9.8 10*3/uL (ref 4.0–10.5)
nRBC: 0 % (ref 0.0–0.2)

## 2020-07-03 LAB — HEMOGLOBIN AND HEMATOCRIT, BLOOD
HCT: 31.7 % — ABNORMAL LOW (ref 39.0–52.0)
Hemoglobin: 10.7 g/dL — ABNORMAL LOW (ref 13.0–17.0)

## 2020-07-03 LAB — TRIGLYCERIDES: Triglycerides: 77 mg/dL (ref <150)

## 2020-07-03 MED ORDER — FLUDROCORTISONE ACETATE 0.1 MG PO TABS
0.1000 mg | ORAL_TABLET | Freq: Every day | ORAL | Status: DC
  Administered 2020-07-03 – 2020-07-09 (×7): 0.1 mg via ORAL
  Filled 2020-07-03 (×7): qty 1

## 2020-07-03 MED ORDER — ENSURE ENLIVE PO LIQD
237.0000 mL | Freq: Three times a day (TID) | ORAL | Status: DC
  Administered 2020-07-03 – 2020-07-07 (×5): 237 mL via ORAL

## 2020-07-03 MED ORDER — ENSURE ENLIVE PO LIQD
237.0000 mL | Freq: Two times a day (BID) | ORAL | Status: DC
  Administered 2020-07-03: 237 mL via ORAL

## 2020-07-03 MED ORDER — PANTOPRAZOLE SODIUM 40 MG PO TBEC
40.0000 mg | DELAYED_RELEASE_TABLET | Freq: Every day | ORAL | Status: DC
  Administered 2020-07-03 – 2020-07-09 (×7): 40 mg via ORAL
  Filled 2020-07-03 (×7): qty 1

## 2020-07-03 NOTE — Progress Notes (Addendum)
NAME:  Tyler Richards, MRN:  [id number], DOB:  09/14/1951, LOS: 4 ADMISSION DATE:  06/29/2020, CONSULTATION DATE:  06/29/20 REFERRING MD:  Estanislado Pandy  CHIEF COMPLAINT:  AMS   Brief History   Tyler Richards is a 69 y.o. male who was admitted 10/4 with R MCA CVA s/p revascularization and R ICA stent assisted angioplasty and revascularization. Loaded with aspirin and Brilinta  Past Medical History  has Arterial ischemic stroke, MCA, right, acute ([place]); Stroke (cerebrum) ([place]); Acute encephalopathy; Hypertension; Acute respiratory failure with hypoxia (HCC); and Middle cerebral artery embolism, right on their problem list.  Significant Hospital Events   10/4 > admit.  Consults:  PCCM.  Procedures:  ETT 10/4 > 10/5  Significant Diagnostic Tests:  CT / CTA head 10/4 >  LVO R ICA / prox MCA  MRi brain 10/5 > Acute infarct at the right basal ganglia with nonprogressive hemorrhage when correlated with prior CT. Less extensive patchy acute cortical infarct in the right MCA territory. The areas of acute infarction correlate well with pretreatment CTP. 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  CT head 10/5 >  3.9 x 3.0 x 3.0 cm hyperdense region in the right posterior basal ganglia most consistent with parenchymal hematoma, given small internal fluid levels. Mild mass effect and surrounding edema. No idline shift. Chronic ischemic changes in the right posterior frontal region as seen previously. Echo 10/4 >  Left ventricular ejection fraction, by estimation, is 50 to 90%, grade 1 diastolic dysfunction  Micro Data:  COVID 10/4 > neg. Flu 10/4 > neg.  Antimicrobials:  None.   Interim history/subjective:  Patient is alert and oriented x3, slightly lethargic but easily arouses to verbal stimuli. Continues to experience bradycardia and vasopressor support Wife at bedside and updated  Objective:  Blood pressure (!) 145/62, pulse (!) 45, temperature  99.1 F (37.3 C), temperature source Axillary, resp. rate 18, height 6\' 2"  (1.88 m), weight 85.1 kg, SpO2 100 %.        Intake/Output Summary (Last 24 hours) at 07/03/2020 0809 Last data filed at 07/03/2020 0600 Gross per 24 hour  Intake 1201.27 ml  Output 2675 ml  Net -1473.73 ml   Filed Weights   06/29/20 1730  Weight: 85.1 kg    Examination: General: Chronically ill appearing very pleasant elderly male sitting up in bed in NAD HEENT: [place]/AT, MM pink/moist, PERRL, left facial droop  Neuro: Alert and oriented x3, left upper extremity flaccid, flicker of movement seen to left foot, right 4/5 strength  CV: s1s2 regular rate and rhythm, no murmur, rubs, or gallops,  PULM:  Clear to ascultation, no increased work of breathing, no added breath sounds GI: soft, bowel sounds active in all 4 quadrants, non-tender, non-distended Extremities: warm/dry, no edema  Skin: no rashes or lesions  Resolved:  Acute respiratory insufficiency post procedure -extubated 10/5 Acute encephalopathy due to current to stroke  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  -Concern for carotid baroreceptor irritation in the setting of recent revascularization  Syncope  -When attempting to transfer from bed to chair 10/7 patient has a witnessed 11sec pause that resulted in a syncopal  episode. At this time vasopressors were resumed  P: Will notify cardiology of syncopal episode  Continuous telemetry Keep atropine at bedside Strict bedrest  Vasopressors as above  Avoid AV nodal blockades   GIB  - Seen with bright red bloody bowel movement 10/7 P: GI consulted  Will need to have endoscopy once stabilized  Continue to monitor for recurrent signs of bleeding   Hypokalemia P: Trend Bmet Supplement as needed   Best Practice:  Diet: Pured diet Pain/Anxiety/Delirium protocol (if indicated): N/A VAP protocol (if indicated): N/A DVT prophylaxis: SCD's. GI prophylaxis: PPI. Glucose control: SSI if glucose consistently > 180. Mobility: Bedrest. Code Status: Full. Family Communication: Per primary. Disposition: Remain in ICU due to change in neurological status  Labs   CBC: Recent Labs  Lab 06/29/20 0904 06/29/20 0915 06/30/20 0043 07/01/20 0433 07/02/20 0424 07/02/20 2310 07/03/20 0311  WBC 9.0  --  9.7 11.2* 10.5  --  9.8  NEUTROABS 6.8  --  7.2  --   --   --   --   HGB 13.9   < > 12.2* 11.7* 10.1* 10.7* 10.3*  HCT 41.2   < > 36.2* 34.2* 30.1* 31.7* 30.6*  MCV 96.5  --  97.1 99.1 96.2  --  96.2  PLT 320  --  380 329 287  --  336   < > = values in this interval not displayed.   Basic Metabolic Panel: Recent Labs  Lab 06/29/20 0904 06/29/20 0904 06/29/20 0915 06/29/20 0915 06/29/20 1738 06/30/20 0043 07/01/20 0433 07/02/20 0424 07/03/20 0311  NA 136   < > 138   < > 138 138 138 135 135  K 5.0   < > 4.5   < > 3.8 3.9 3.6 3.4* 3.5  CL 103   < > 105  --   --  106 106 104 105  CO2 20*  --   --   --   --  20* 21* 22 21*  GLUCOSE 104*   < > 105*  --   --  113* 107* 103* 102*  BUN 14   < > 17  --   --  10 8 11 12   CREATININE 0.87   < > 0.70  --   --  0.89 0.85 0.79 0.77  CALCIUM 9.1  --   --   --   --  8.4* 8.5* 8.4* 8.2*   < > = values in this interval not displayed.   GFR: Estimated Creatinine Clearance: 99.9 mL/min (by C-G formula based on  SCr of 0.77 mg/dL). Recent Labs  Lab 06/30/20 0043 07/01/20 0433 07/02/20 0424 07/03/20 0311  WBC 9.7 11.2* 10.5 9.8   Liver Function Tests: Recent Labs  Lab 06/29/20 0904  AST 36  ALT 17  ALKPHOS 61  BILITOT 1.8*  PROT 6.5  ALBUMIN 3.3*   No results for input(s): LIPASE, AMYLASE in the last 168 hours. No results for input(s): AMMONIA in the last 168 hours. ABG    Component Value Date/Time   PHART 7.380 06/29/2020 1738   PCO2ART 41.3 06/29/2020 1738   PO2ART 252 (H) 06/29/2020 1738   HCO3 24.4 06/29/2020 1738   TCO2 26 06/29/2020 1738   ACIDBASEDEF 1.0 06/29/2020 1738   O2SAT 100.0  06/29/2020 1738    Coagulation Profile: Recent Labs  Lab 06/29/20 0904  INR 1.0   Cardiac Enzymes: No results for input(s): CKTOTAL, CKMB, CKMBINDEX, TROPONINI in the last 168 hours. HbA1C: Hgb A1c MFr Bld  Date/Time Value Ref Range Status  06/30/2020 12:43 AM 5.6 4.8 - 5.6 % Final    Comment:    (NOTE) Pre diabetes:          5.7%-6.4%  Diabetes:              >6.4%  Glycemic control for   <7.0% adults with diabetes    CBG: Recent Labs  Lab 06/29/20 0904  GLUCAP 88   CRITICAL CARE Performed by: Johnsie Cancel  Total critical care time: 38 minutes  Critical care time was exclusive of separately billable procedures and treating other patients.  Critical care was necessary to treat or prevent imminent or life-threatening deterioration.  Critical care was time spent personally by me on the following activities: development of treatment plan with patient and/or surrogate as well as nursing, discussions with consultants, evaluation of patient's response to treatment, examination of patient, obtaining history from patient or surrogate, ordering and performing treatments and interventions, ordering and review of laboratory studies, ordering and review of radiographic studies, pulse oximetry and re-evaluation of patient's condition.  Johnsie Cancel, NP-C [facility] Contact / Pager information can be found on Amion  07/03/2020, 8:44 AM

## 2020-07-03 NOTE — Progress Notes (Signed)
Inpatient Rehabilitation Admissions Coordinator  Noted continued medical work up . I will follow up next week with his progress.  Danne Baxter, RN, MSN Rehab Admissions Coordinator [phone] 07/03/2020 2:01 PM

## 2020-07-03 NOTE — Progress Notes (Signed)
Initial Nutrition Assessment  DOCUMENTATION CODES:   Not applicable  INTERVENTION:   Ensure Enlive po TID, each supplement provides 350 kcal and 20 grams of protein  Magic cup BID with meals, each supplement provides 290 kcal and 9 grams of protein  If continues with poor po intake, recommend considering Cortrak insertion with initiation of supplemental TF   NUTRITION DIAGNOSIS:   Inadequate oral intake related to decreased appetite, acute illness as evidenced by per patient/family report.  GOAL:   Patient will meet greater than or equal to 90% of their needs  MONITOR:   PO intake, Supplement acceptance, Labs, Weight trends  REASON FOR ASSESSMENT:   Consult Assessment of nutrition requirement/status, Poor PO  ASSESSMENT:   69 yo male admitted with R MCA CVA requiring revascularization and R ICA stent assisted angioplasty and revascularization, GI bleed. No PMH   10/04 Admitted, Intubated 10/05 Extubated 10/06 Diet advanced to Dysphagia I, Thin  No recorded po intake. Spoke with RN who indicates pt mostly drinking fluids and eating applesauce.   Current wt 85 kg; no previous weight encounters  Labs: reviewed Meds: reviewed  Diet Order:   Diet Order            DIET - DYS 1 Room service appropriate? Yes with Assist; Fluid consistency: Thin  Diet effective now                 EDUCATION NEEDS:   Not appropriate for education at this time  Skin:  Skin Assessment: Reviewed RN Assessment  Last BM:  no documented BM  Height:   Ht Readings from Last 1 Encounters:  06/29/20 6\' 2"  (1.88 m)    Weight:   Wt Readings from Last 1 Encounters:  06/29/20 85.1 kg    BMI:  Body mass index is 24.09 kg/m.  Estimated Nutritional Needs:   Kcal:  2000-2200 kcals  Protein:  100-115 g  Fluid:  >/= 2 L   Kerman Passey MS, RDN, LDN, CNSC Registered Dietitian III Clinical Nutrition RD Pager and On-Call Pager Number Located in [place]

## 2020-07-03 NOTE — Progress Notes (Signed)
Orthopedic Tech Progress Note Patient Details:  Tyler Richards 14-Mar-1951 [id number] Called in order to [facility] for a RESTING HAND SPLINT Patient ID: Tyler Richards, male   DOB: 1951-09-09, 69 y.o.   MRN: [id number]   Janit Pagan 07/03/2020, 11:31 AM

## 2020-07-03 NOTE — Consult Note (Signed)
Consultation  Referring Provider: Dr. Erlinda Hong     Primary Care Physician:  Patient, No Pcp Per Primary Gastroenterologist: Althia Forts        Reason for Consultation: GI bleed in the setting of recent stroke         HPI:   Tyler Richards is a 69 y.o. male with a past medical history as listed below, who originally presented to the ER on 06/29/2020 with acute onset of left hemiplegia.  At admission CTA of the head and neck demonstrated acute right ICA and MCA occlusions, he is now status post revascularization and right ICA stent assisted angioplasty.  He was started on aspirin and Brilinta.  Also has history of bradycardia with syncope being seen by cardiology this day.  Patient then started with bloody bowel movements yesterday and we were consulted.  Hemoglobin 11.7--> 10.1--> 10.3.    This morning, the patient is found laying in his bed with his wife by his bedside. The nurse is also there who helps with his history. His wife explains that he is in perfectly good health never really needing to go to the doctor and never previously seen anyone for colonoscopy until the morning when he woke up with a stroke. Nursing tells me that he started with some bright red blood yesterday afternoon when they were giving him a bath but it wasn't very much and he didn't think about it. He then proceeded to have three more bowel movements with bright red blood overnight, none this morning. Patient denies any abdominal pain, heartburn, reflux, nausea, vomiting. Wife tells me he has not been constipated recently or been having diarrhea. No history of hemorrhoids that she is aware.    Nursing explains that he is on Brilinta, aspirin and heparin subcu.    Denies fever, chills or weight loss.        Past medical and Past Surgical History:  Procedure Laterality Date  . IR ANGIO INTRA EXTRACRAN SEL COM CAROTID INNOMINATE UNI L MOD SED  06/29/2020  . IR CT HEAD LTD  06/29/2020  . IR CT HEAD LTD  06/29/2020  . IR INTRAVSC  STENT CERV CAROTID W/O EMB-PROT MOD SED INC ANGIO  06/29/2020  . IR PERCUTANEOUS ART THROMBECTOMY/INFUSION INTRACRANIAL INC DIAG ANGIO  06/29/2020  . RADIOLOGY WITH ANESTHESIA N/A 06/29/2020   Procedure: IR WITH ANESTHESIA;  Surgeon: Radiologist, Medication, MD;  Location: [place];  Service: Radiology;  Laterality: N/A;   Family history: No GI cancer  Social History   Tobacco Use  . Smoking status: Never Smoker  . Smokeless tobacco: Never Used  Substance Use Topics  . Alcohol use: Not Currently  . Drug use: Never    Prior to Admission medications   Medication Sig Start Date End Date Taking? Authorizing Provider  acetaminophen (TYLENOL) 325 MG tablet Take 650 mg by mouth every 6 (six) hours as needed for mild pain or headache.   Yes [provider]  aspirin EC 81 MG tablet Take 81 mg by mouth daily as needed for mild pain. Swallow whole.   Yes [provider]    Current Facility-Administered Medications  Medication Dose Route Frequency Provider Last Rate Last Admin  .  stroke: mapping our early stages of recovery book   Does not apply Once Kerney Elbe, MD      . 0.9 %  sodium chloride infusion  250 mL Intravenous Continuous Rosalin Hawking, MD 75 mL/hr at 07/03/20 0400 Rate Verify at 07/03/20 0400  .  acetaminophen (TYLENOL) tablet 650 mg  650 mg Oral Q4H PRN Kerney Elbe, MD   650 mg at 07/02/20 2348   Or  . acetaminophen (TYLENOL) 160 MG/5ML solution 650 mg  650 mg Per Tube Q4H PRN Kerney Elbe, MD       Or  . acetaminophen (TYLENOL) suppository 650 mg  650 mg Rectal Q4H PRN Kerney Elbe, MD   650 mg at 07/01/20 0515  . aspirin chewable tablet 81 mg  81 mg Oral Daily Deveshwar, Willaim Rayas, MD   81 mg at 07/02/20 1053   Or  . aspirin chewable tablet 81 mg  81 mg Per Tube Daily Luanne Bras, MD   81 mg at 06/30/20 1021  . atorvastatin (LIPITOR) tablet 40 mg  40 mg Oral Daily Rosalin Hawking, MD   40 mg at 07/02/20 1051  . chlorhexidine (PERIDEX) 0.12 % solution 15 mL   15 mL Mouth Rinse BID Rosalin Hawking, MD   15 mL at 07/02/20 2200  . Chlorhexidine Gluconate Cloth 2 % PADS 6 each  6 each Topical Daily Kerney Elbe, MD   6 each at 07/02/20 1723  . docusate (COLACE) 50 MG/5ML liquid 100 mg  100 mg Oral BID Rosalin Hawking, MD   100 mg at 07/02/20 1058  . heparin injection 5,000 Units  5,000 Units Subcutaneous Q8H Rosalin Hawking, MD   5,000 Units at 07/03/20 0540  . influenza vaccine adjuvanted (FLUAD) injection 0.5 mL  0.5 mL Intramuscular Tomorrow-1000 Jacky Kindle, MD      . MEDLINE mouth rinse  15 mL Mouth Rinse q12n4p Rosalin Hawking, MD   15 mL at 07/02/20 1600  . midodrine (PROAMATINE) tablet 10 mg  10 mg Oral Q8H Jacky Kindle, MD   10 mg at 07/03/20 0539  . norepinephrine (LEVOPHED) 4mg  in 288mL premix infusion  2-10 mcg/min Intravenous Titrated Jacky Kindle, MD 7.5 mL/hr at 07/03/20 0746 2 mcg/min at 07/03/20 0746  . pneumococcal 23 valent vaccine (PNEUMOVAX-23) injection 0.5 mL  0.5 mL Intramuscular Tomorrow-1000 Chand, Sudham, MD      . polyethylene glycol (MIRALAX / GLYCOLAX) packet 17 g  17 g Oral Daily Rosalin Hawking, MD   17 g at 07/02/20 1118  . senna-docusate (Senokot-S) tablet 1 tablet  1 tablet Oral QHS PRN Kerney Elbe, MD      . ticagrelor [facility]) tablet 90 mg  90 mg Oral BID Luanne Bras, MD   90 mg at 07/02/20 2156   Or  . ticagrelor (BRILINTA) tablet 90 mg  90 mg Per Tube BID Luanne Bras, MD   90 mg at 06/30/20 1021    Allergies as of 06/29/2020  . (No Known Allergies)     Review of Systems:    Constitutional: No weight loss, fever or chills Skin: No rash Cardiovascular: No chest pain Respiratory: No SOB  Gastrointestinal: See HPI and otherwise negative Genitourinary: No dysuria  Neurological: No headache Musculoskeletal: No new muscle or joint pain Hematologic: No bruising Psychiatric: No history of depression or anxiety    Physical Exam:  Vital signs in last 24 hours: Temp:  [98.4 F (36.9 C)-101.7 F (38.7 C)] 99.1  F (37.3 C) (10/08 0330) Pulse Rate:  [36-95] 45 (10/08 0600) Resp:  [14-28] 18 (10/08 0600) BP: (84-193)/(47-108) 145/62 (10/08 0600) SpO2:  [89 %-100 %] 100 % (10/08 0600) Last BM Date:  (PTA) General:   Pleasant Caucasian male appears to be in NAD, Well developed, Well nourished, alert and cooperative Head:  Normocephalic and atraumatic. Eyes:  PEERL, EOMI. No icterus. Conjunctiva pink. Ears:  Normal auditory acuity. Neck:  Supple Throat: Oral cavity and pharynx without inflammation, swelling or lesion.  Lungs: Respirations even and unlabored. Lungs clear to auscultation bilaterally.   No wheezes, crackles, or rhonchi.  Heart: Normal S1, S2. No MRG. Regular rate and rhythm. No peripheral edema, cyanosis or pallor.  Abdomen:  Soft, nondistended, nontender. No rebound or guarding. Normal bowel sounds. No appreciable masses or hepatomegaly. Rectal:  External: small amount of dried red blood, no fissure or hemorrhoids; Internal: small amount of brb residue, no mass, no ttp Msk:  Symmetrical without gross deformities. Peripheral pulses intact.  Extremities:  Without edema, no deformity or joint abnormality Neurologic:  Alert and  oriented x4, +left side hemiplegia Skin:   Dry and intact without significant lesions or rashes. Psychiatric: Demonstrates good judgement and reason without abnormal affect or behaviors.   LAB RESULTS: Recent Labs    07/01/20 0433 07/01/20 0433 07/02/20 0424 07/02/20 2310 07/03/20 0311  WBC 11.2*  --  10.5  --  9.8  HGB 11.7*   < > 10.1* 10.7* 10.3*  HCT 34.2*   < > 30.1* 31.7* 30.6*  PLT 329  --  287  --  336   < > = values in this interval not displayed.   BMET Recent Labs    07/01/20 0433 07/02/20 0424 07/03/20 0311  NA 138 135 135  K 3.6 3.4* 3.5  CL 106 104 105  CO2 21* 22 21*  GLUCOSE 107* 103* 102*  BUN 8 11 12   CREATININE 0.85 0.79 0.77  CALCIUM 8.5* 8.4* 8.2*    STUDIES: DG Swallowing Func-Speech Pathology  Result Date:  07/01/2020 Objective Swallowing Evaluation: Type of Study: Bedside Swallow Evaluation  Patient Details Name: ABDULAH IQBAL MRN: [id number] Date of Birth: October 03, 1950 Today's Date: 07/01/2020 Time: SLP Start Time (ACUTE ONLY): 7681 -SLP Stop Time (ACUTE ONLY): 1310 SLP Time Calculation (min) (ACUTE ONLY): 18 min Past Medical History: No past medical history on file. Past Surgical History: Past Surgical History: Procedure Laterality Date . RADIOLOGY WITH ANESTHESIA N/A 06/29/2020  Procedure: IR WITH ANESTHESIA;  Surgeon: Radiologist, Medication, MD;  Location: [place];  Service: Radiology;  Laterality: N/A; HPI: Patient is a 69 y/o male who presents with left sided weakness, right gaze and slurred speech. NIH:17. Head CT- dense Right MCA infarct. CTA- Right ICA and MCA occlusions. s/p right MCA thrombectomy and right ICA stent placement 06/29/20 with post procedure hemmorhage. ETT 10/4-10/5. No PMH.  Subjective: alert, cooperative Assessment / Plan / Recommendation CHL IP CLINICAL IMPRESSIONS 07/01/2020 Clinical Impression Pt has an oral more than pharyngeal dysphagia, with oral preparation and transit impacted by reduced strength and suspect sensation as well. He has intermittent trouble getting liquids via cup or straw, with reduced seal and sucking. There is anterior spillage with thin liquids; L buccal pocketing with more solid consistencies and slower posterior transit. Mild premature spillage occurs with thin liquids. He has relatively improved pharyngeal function with only mildly reduced base of tongue retraction that results in trace-mild vallecular residue with purees and mild residue with solids. Recommend starting Dys 1 diet and thin liquids. Will f/u for advancement of solids clinically.  SLP Visit Diagnosis Dysphagia, oropharyngeal phase (R13.12) Attention and concentration deficit following -- Frontal lobe and executive function deficit following -- Impact on safety and function Mild aspiration risk   CHL IP  TREATMENT RECOMMENDATION 07/01/2020 Treatment Recommendations Therapy as outlined in treatment plan below   Prognosis 07/01/2020  Prognosis for Safe Diet Advancement Good Barriers to Reach Goals -- Barriers/Prognosis Comment -- CHL IP DIET RECOMMENDATION 07/01/2020 SLP Diet Recommendations Dysphagia 1 (Puree) solids;Thin liquid Liquid Administration via Straw;Cup Medication Administration Crushed with puree Compensations Minimize environmental distractions;Slow rate;Small sips/bites;Lingual sweep for clearance of pocketing Postural Changes Seated upright at 90 degrees   CHL IP OTHER RECOMMENDATIONS 07/01/2020 Recommended Consults -- Oral Care Recommendations Oral care before and after PO Other Recommendations Have oral suction available   CHL IP FOLLOW UP RECOMMENDATIONS 07/01/2020 Follow up Recommendations Inpatient Rehab   CHL IP FREQUENCY AND DURATION 07/01/2020 Speech Therapy Frequency (ACUTE ONLY) min 2x/week Treatment Duration 2 weeks      CHL IP ORAL PHASE 07/01/2020 Oral Phase Impaired Oral - Pudding Teaspoon -- Oral - Pudding Cup -- Oral - Honey Teaspoon -- Oral - Honey Cup -- Oral - Nectar Teaspoon -- Oral - Nectar Cup -- Oral - Nectar Straw -- Oral - Thin Teaspoon -- Oral - Thin Cup Left anterior bolus loss;Weak lingual manipulation;Decreased bolus cohesion;Premature spillage Oral - Thin Straw Weak lingual manipulation;Decreased bolus cohesion;Premature spillage Oral - Puree Weak lingual manipulation;Decreased bolus cohesion;Premature spillage Oral - Mech Soft Weak lingual manipulation;Decreased bolus cohesion;Premature spillage;Left pocketing in lateral sulci Oral - Regular -- Oral - Multi-Consistency -- Oral - Pill -- Oral Phase - Comment --  CHL IP PHARYNGEAL PHASE 07/01/2020 Pharyngeal Phase Impaired Pharyngeal- Pudding Teaspoon -- Pharyngeal -- Pharyngeal- Pudding Cup -- Pharyngeal -- Pharyngeal- Honey Teaspoon -- Pharyngeal -- Pharyngeal- Honey Cup -- Pharyngeal -- Pharyngeal- Nectar Teaspoon -- Pharyngeal  -- Pharyngeal- Nectar Cup -- Pharyngeal -- Pharyngeal- Nectar Straw -- Pharyngeal -- Pharyngeal- Thin Teaspoon -- Pharyngeal -- Pharyngeal- Thin Cup Delayed swallow initiation-pyriform sinuses;Reduced tongue base retraction Pharyngeal -- Pharyngeal- Thin Straw Delayed swallow initiation-pyriform sinuses;Reduced tongue base retraction Pharyngeal -- Pharyngeal- Puree Reduced tongue base retraction;Pharyngeal residue - valleculae Pharyngeal -- Pharyngeal- Mechanical Soft Reduced tongue base retraction;Pharyngeal residue - valleculae Pharyngeal -- Pharyngeal- Regular -- Pharyngeal -- Pharyngeal- Multi-consistency -- Pharyngeal -- Pharyngeal- Pill -- Pharyngeal -- Pharyngeal Comment --  CHL IP CERVICAL ESOPHAGEAL PHASE 07/01/2020 Cervical Esophageal Phase WFL Pudding Teaspoon -- Pudding Cup -- Honey Teaspoon -- Honey Cup -- Nectar Teaspoon -- Nectar Cup -- Nectar Straw -- Thin Teaspoon -- Thin Cup -- Thin Straw -- Puree -- Mechanical Soft -- Regular -- Multi-consistency -- Pill -- Cervical Esophageal Comment -- [name], M.A. [place] [facility] Pager [phone] Office [phone] 07/01/2020, 2:21 PM              EEG adult  Result Date: 07/02/2020 Lora Havens, MD     07/02/2020  6:23 PM Patient Name: CHIRSTOPHER IOVINO MRN: [id number] Epilepsy Attending: Lora Havens Referring Physician/Provider: Dr Rosalin Hawking Date: 07/02/2020 Duration: 24.26 mins Patient history: 69 y.o. male with history of gout admitted for left-sided weakness, right gaze, left facial droop with fall. MRI showed acute infarct at the right basal ganglia with nonprogressive hemorrhage as well as less extensive patchy acute cortical infarct in the right MCA territory EEG to evaluate for seizure. Level of alertness: Awake, asleep AEDs during EEG study: None Technical aspects: This EEG study was done with scalp electrodes positioned according to the 10-20 International system of electrode placement. Electrical activity was  acquired at a sampling rate of 500Hz  and reviewed with a high frequency filter of 70Hz  and a low frequency filter of 1Hz . EEG data were recorded continuously and digitally stored. Description: The posterior dominant rhythm consists of 8 Hz activity of  moderate voltage (25-35 uV) seen predominantly in posterior head regions, asymmetric ( R<L) and reactive to eye opening and eye closing. Sleep was characterized by vertex waves, sleep spindles (12 to 14 Hz), maximal frontocentral region.  EEG showed continuous 3 to 5 Hz theta-delta slowing in right hemisphere.  Hyperventilation and photic stimulation were not performed.   ABNORMALITY -Continuous slow, right hemisphere - Background asymmetry, right<left IMPRESSION: This study is suggestive of cortical dysfunction in right hemisphere consistent with underlying stroke. No seizures or epileptiform discharges were seen throughout the recording. Priyanka Barbra Sarks    Impression / Plan:   Impression: 1.  GI bleed: Started with bright red bloody bowel movements 07/02/2020, none today, hemoglobin 11.7--> 10.1--> 10.3 overnight, rectal exam with some bright red blood residue and no obvious source, no history of colonoscopy in the past; consider most likely colonic bleed exacerbated by recent addition of Brilinta, heparin and aspirin 2.  Bradycardia: Being followed by cardiology, patient did have a syncopal episode yesterday 3.  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  Discussed case with Dr. Havery Moros.  We would be happy to help out with a colonoscopy, but need to make sure patient is stable.  Spoke with neurology who said that we can hold subQ heparin and the Brilinta if needed for procedure for a short time.  Just keep him on the Aspirin.  Did reach out to cardiology who has yet to see the patient today, but will let us know their feeling on whether or not he is stable from a cardiac standpoint. 2.  Continue to monitor  for further GI bleed, if acute aggressive GI bleed then will need a CTA. 3.  Continue to monitor hemoglobin and transfusion as needed less than 7 4.  Continue other supportive measures 5.  Please await any further recommendations from Dr. Havery Moros later today.  Thank you for your kind consultation, we will continue to follow.  Lavone Nian Durante Violett  07/03/2020, 8:40 AM

## 2020-07-03 NOTE — Progress Notes (Addendum)
Occupational Therapy Treatment Patient Details Name: Tyler Richards MRN: [id number] DOB: [birth year] Today's Date: 07/03/2020    History of present illness Patient is a 69 y/o male who presents with left sided weakness, right gaze and slurred speech. NIH:17. Head CT- dense Right MCA infarct. CTA- Right ICA and MCA occlusions. s/p right MCA thrombectomy and right ICA stent placement 06/29/20 with post procedure hemmorhage. No PMH.   OT comments  This 69 yo male admitted with above presents to acute OT with making progress with sitting balance today as well as attempting grooming tasks while seated EOB. He will continue to benefit from acute OT with follow up on CIR. I will order a splint for his LUE and OT will follow up for wearing schedule.  Follow Up Recommendations  CIR;Supervision/Assistance - 24 hour    Equipment Recommendations  3 in 1 bedside commode;Wheelchair (measurements OT);Wheelchair cushion (measurements OT)    Recommendations for Other Services Rehab consult    Precautions / Restrictions Precautions Precautions: Fall Precaution Comments: watch HR, becomes bradycardic, watch BP, can get hypotensive Restrictions Weight Bearing Restrictions: No       Mobility Bed Mobility Overal bed mobility: Needs Assistance Bed Mobility: Supine to Sit;Sit to Supine     Supine to sit: Mod assist Sit to supine: Mod assist   General bed mobility comments: VCs for supine>sit bed mobility sequencing (legs with increased cues for LLE, reach for rail with RUE, bring up trunk); sit>supine A for trunk and legs  Transfers                 General transfer comment: Did not stand or get OOB at request of RN due to vagal response yesterday and cardiac following    Balance Overall balance assessment: Needs assistance Sitting-balance support: Bilateral upper extremity supported Sitting balance-Leahy Scale: Poor Sitting balance - Comments: min guard A-mod A with left lateral lean,  mainly self correct with cues; howecer he was able to self correct at times without cues (however had to be more than ~1/4 away from midline before he may self correct on his own); definitely more issues with sitting balance when a task was added (ie: combing hair, brushing teeth)--did not tend to self correct unless cued                                 BP supine 120, sitting initally 96, sitting ~7 minutes 99, sitting ~15 minutes 116. HR as low as 49--not c/o or s/s of dizziness     ADL either performed or assessed with clinical judgement   ADL Overall ADL's : Needs assistance/impaired       Grooming Details (indicate cue type and reason): Pt able to brush his teeth and comb his hair sitting EOB with Min A for task; however needs close to constant cues for sitting balance (tends to lean to left) and occassional A to correct balance. Occassionally he will self correct, but only after he is fairly off balance to his left.                                     Vision Baseline Vision/History: Wears glasses Wears Glasses: Reading only Vision Assessment?: Yes Additional Comments: Pt with difficulty finding toothbrush on table in front of him (midline), water cup to his right (found one to far right, but not  one closer to him); with cues while supine in bed, pt was able to find clock on his left and tell us time--needed VCs to find sign of bathroom door, but then able to read it.          Cognition Arousal/Alertness: Awake/alert Behavior During Therapy: WFL for tasks assessed/performed Overall Cognitive Status: Impaired/Different from baseline Area of Impairment: Safety/judgement;Following commands;Problem solving                   Current Attention Level: Sustained   Following Commands: Follows one step commands with increased time Safety/Judgement: Decreased awareness of safety;Decreased awareness of deficits Awareness: Emergent Problem Solving: Slow  processing;Difficulty sequencing;Requires verbal cues;Requires tactile cues General Comments: following directions better today (only one episode of distractablitiy sitting EOB--looking a RN throught small window), showing increased awareness of sitting balance (with and without cues)        Exercises Other Exercises Other Exercises: Wife reports she has been doing PROM at on his LUE. I feel pt would benefit from a resting hand splint--Dr. Erlinda Hong has given be a verbal order through chat text, I will order today.           Pertinent Vitals/ Pain       Pain Assessment: No/denies pain         Frequency  Min 2X/week        Progress Toward Goals  OT Goals(current goals can now be found in the care plan section)  Progress towards OT goals: Progressing toward goals     Plan Discharge plan remains appropriate    Co-evaluation    PT/OT/SLP Co-Evaluation/Treatment: Yes Reason for Co-Treatment: For patient/therapist safety;To address functional/ADL transfers          AM-PAC OT "6 Clicks" Daily Activity     Outcome Measure   Help from another person eating meals?: A Lot Help from another person taking care of personal grooming?: A Lot Help from another person toileting, which includes using toliet, bedpan, or urinal?: Total Help from another person bathing (including washing, rinsing, drying)?: A Lot Help from another person to put on and taking off regular upper body clothing?: A Lot Help from another person to put on and taking off regular lower body clothing?: Total 6 Click Score: 10    End of Session    OT Visit Diagnosis: Other abnormalities of gait and mobility (R26.89);Hemiplegia and hemiparesis;Other symptoms and signs involving cognitive function;Other symptoms and signs involving the nervous system (R29.898) Hemiplegia - Right/Left: Left Hemiplegia - dominant/non-dominant: Non-Dominant Hemiplegia - caused by: Cerebral infarction   Activity Tolerance Patient  tolerated treatment well   Patient Left in bed;with call bell/phone within reach;with bed alarm set;with family/visitor present   Nurse Communication Mobility status        Time: 9432-7614 OT Time Calculation (min): 36 min  Charges: OT General Charges $OT Visit: 1 Visit OT Treatments $Self Care/Home Management : 8-22 mins  Tyler Richards, OTR/L Acute Rehab Services Pager [phone] Office [phone]      Almon Register 07/03/2020, 11:31 AM

## 2020-07-03 NOTE — Progress Notes (Addendum)
Electrophysiology Rounding Note  Patient Name: Tyler Richards Date of Encounter: 07/03/2020  Primary Cardiologist: No primary care provider on file. Electrophysiologist: New to Dr. Quentin Ore   Subjective   Yesterday around 1130 pt 7.8 second pause while transferring to chair with incontinence and hypotension. He recovered quickly after being placed in Trendelenberg. BP 84/64 at 1130, BP 121/76 and alert at 1131.     Discussed with RN and he becomes hypotensive with attempted weaning of NE, regardless of HR.   Inpatient Medications    Scheduled Meds: .  stroke: mapping our early stages of recovery book   Does not apply Once  . aspirin  81 mg Oral Daily   Or  . aspirin  81 mg Per Tube Daily  . atorvastatin  40 mg Oral Daily  . chlorhexidine  15 mL Mouth Rinse BID  . Chlorhexidine Gluconate Cloth  6 each Topical Daily  . docusate  100 mg Oral BID  . heparin injection (subcutaneous)  5,000 Units Subcutaneous Q8H  . influenza vaccine adjuvanted  0.5 mL Intramuscular Tomorrow-1000  . mouth rinse  15 mL Mouth Rinse q12n4p  . midodrine  10 mg Oral Q8H  . pneumococcal 23 valent vaccine  0.5 mL Intramuscular Tomorrow-1000  . polyethylene glycol  17 g Oral Daily  . ticagrelor  90 mg Oral BID   Or  . ticagrelor  90 mg Per Tube BID   Continuous Infusions: . sodium chloride 75 mL/hr at 07/03/20 0400  . norepinephrine (LEVOPHED) Adult infusion 2 mcg/min (07/03/20 0746)   PRN Meds: acetaminophen **OR** acetaminophen (TYLENOL) oral liquid 160 mg/5 mL **OR** acetaminophen, senna-docusate   Vital Signs    Vitals:   07/03/20 0530 07/03/20 0545 07/03/20 0600 07/03/20 0800  BP: (!) 148/65 (!) 101/55 (!) 145/62   Pulse: (!) 46 (!) 44 (!) 45   Resp: 17 19 18    Temp:    (!) 97.5 F (36.4 C)  TempSrc:    Oral  SpO2: 100% 100% 100%   Weight:      Height:        Intake/Output Summary (Last 24 hours) at 07/03/2020 0902 Last data filed at 07/03/2020 0600 Gross per 24 hour  Intake 1193.41  ml  Output 2675 ml  Net -1481.59 ml   Filed Weights   06/29/20 1730  Weight: 85.1 kg    Physical Exam    GEN- The patient is chronically ill appearing HEENT: Normocephalic, atraumatic. Left facial droop. Heart- Somewhat irregular and slow.  GI- NT, ND Extremities- no clubbing or cyanosis. No edema Skin- no rash or lesion Psych- euthymic mood, flat but appropriate  Neuro- LUE flaccid.   Labs    CBC Recent Labs    07/02/20 0424 07/02/20 0424 07/02/20 2310 07/03/20 0311  WBC 10.5  --   --  9.8  HGB 10.1*   < > 10.7* 10.3*  HCT 30.1*   < > 31.7* 30.6*  MCV 96.2  --   --  96.2  PLT 287  --   --  336   < > = values in this interval not displayed.   Basic Metabolic Panel Recent Labs    07/02/20 0424 07/03/20 0311  NA 135 135  K 3.4* 3.5  CL 104 105  CO2 22 21*  GLUCOSE 103* 102*  BUN 11 12  CREATININE 0.79 0.77  CALCIUM 8.4* 8.2*   Liver Function Tests No results for input(s): AST, ALT, ALKPHOS, BILITOT, PROT, ALBUMIN in the last 72 hours.  No results for input(s): LIPASE, AMYLASE in the last 72 hours. Cardiac Enzymes No results for input(s): CKTOTAL, CKMB, CKMBINDEX, TROPONINI in the last 72 hours.   Telemetry    This morning NSR in 60s. He continues to have intermittent sinus slowing into the upper 30s-40s, seldomly upper 20s .Yesterday around 1130 pt 7.8 second pause while transferring to chair with incontinence and hypotension.  (personally reviewed)  Radiology    DG Swallowing Func-Speech Pathology  Result Date: 07/01/2020 Objective Swallowing Evaluation: Type of Study: Bedside Swallow Evaluation  Patient Details Name: Tyler Richards MRN: [id number] Date of Birth: 1951/01/14 Today's Date: 07/01/2020 Time: SLP Start Time (ACUTE ONLY): 4315 -SLP Stop Time (ACUTE ONLY): 1310 SLP Time Calculation (min) (ACUTE ONLY): 18 min Past Medical History: No past medical history on file. Past Surgical History: Past Surgical History: Procedure Laterality Date . RADIOLOGY  WITH ANESTHESIA N/A 06/29/2020  Procedure: IR WITH ANESTHESIA;  Surgeon: Radiologist, Medication, MD;  Location: [facility];  Service: Radiology;  Laterality: N/A; HPI: Patient is a 69 y/o male who presents with left sided weakness, right gaze and slurred speech. NIH:17. Head CT- dense Right MCA infarct. CTA- Right ICA and MCA occlusions. s/p right MCA thrombectomy and right ICA stent placement 06/29/20 with post procedure hemmorhage. ETT 10/4-10/5. No PMH.  Subjective: alert, cooperative Assessment / Plan / Recommendation CHL IP CLINICAL IMPRESSIONS 07/01/2020 Clinical Impression Pt has an oral more than pharyngeal dysphagia, with oral preparation and transit impacted by reduced strength and suspect sensation as well. He has intermittent trouble getting liquids via cup or straw, with reduced seal and sucking. There is anterior spillage with thin liquids; L buccal pocketing with more solid consistencies and slower posterior transit. Mild premature spillage occurs with thin liquids. He has relatively improved pharyngeal function with only mildly reduced base of tongue retraction that results in trace-mild vallecular residue with purees and mild residue with solids. Recommend starting Dys 1 diet and thin liquids. Will f/u for advancement of solids clinically.  SLP Visit Diagnosis Dysphagia, oropharyngeal phase (R13.12) Attention and concentration deficit following -- Frontal lobe and executive function deficit following -- Impact on safety and function Mild aspiration risk   CHL IP TREATMENT RECOMMENDATION 07/01/2020 Treatment Recommendations Therapy as outlined in treatment plan below   Prognosis 07/01/2020 Prognosis for Safe Diet Advancement Good Barriers to Reach Goals -- Barriers/Prognosis Comment -- CHL IP DIET RECOMMENDATION 07/01/2020 SLP Diet Recommendations Dysphagia 1 (Puree) solids;Thin liquid Liquid Administration via Straw;Cup Medication Administration Crushed with puree Compensations Minimize environmental  distractions;Slow rate;Small sips/bites;Lingual sweep for clearance of pocketing Postural Changes Seated upright at 90 degrees   CHL IP OTHER RECOMMENDATIONS 07/01/2020 Recommended Consults -- Oral Care Recommendations Oral care before and after PO Other Recommendations Have oral suction available   CHL IP FOLLOW UP RECOMMENDATIONS 07/01/2020 Follow up Recommendations Inpatient Rehab   CHL IP FREQUENCY AND DURATION 07/01/2020 Speech Therapy Frequency (ACUTE ONLY) min 2x/week Treatment Duration 2 weeks      CHL IP ORAL PHASE 07/01/2020 Oral Phase Impaired Oral - Pudding Teaspoon -- Oral - Pudding Cup -- Oral - Honey Teaspoon -- Oral - Honey Cup -- Oral - Nectar Teaspoon -- Oral - Nectar Cup -- Oral - Nectar Straw -- Oral - Thin Teaspoon -- Oral - Thin Cup Left anterior bolus loss;Weak lingual manipulation;Decreased bolus cohesion;Premature spillage Oral - Thin Straw Weak lingual manipulation;Decreased bolus cohesion;Premature spillage Oral - Puree Weak lingual manipulation;Decreased bolus cohesion;Premature spillage Oral - Mech Soft Weak lingual manipulation;Decreased bolus cohesion;Premature spillage;Left pocketing  in lateral sulci Oral - Regular -- Oral - Multi-Consistency -- Oral - Pill -- Oral Phase - Comment --  CHL IP PHARYNGEAL PHASE 07/01/2020 Pharyngeal Phase Impaired Pharyngeal- Pudding Teaspoon -- Pharyngeal -- Pharyngeal- Pudding Cup -- Pharyngeal -- Pharyngeal- Honey Teaspoon -- Pharyngeal -- Pharyngeal- Honey Cup -- Pharyngeal -- Pharyngeal- Nectar Teaspoon -- Pharyngeal -- Pharyngeal- Nectar Cup -- Pharyngeal -- Pharyngeal- Nectar Straw -- Pharyngeal -- Pharyngeal- Thin Teaspoon -- Pharyngeal -- Pharyngeal- Thin Cup Delayed swallow initiation-pyriform sinuses;Reduced tongue base retraction Pharyngeal -- Pharyngeal- Thin Straw Delayed swallow initiation-pyriform sinuses;Reduced tongue base retraction Pharyngeal -- Pharyngeal- Puree Reduced tongue base retraction;Pharyngeal residue - valleculae Pharyngeal --  Pharyngeal- Mechanical Soft Reduced tongue base retraction;Pharyngeal residue - valleculae Pharyngeal -- Pharyngeal- Regular -- Pharyngeal -- Pharyngeal- Multi-consistency -- Pharyngeal -- Pharyngeal- Pill -- Pharyngeal -- Pharyngeal Comment --  CHL IP CERVICAL ESOPHAGEAL PHASE 07/01/2020 Cervical Esophageal Phase WFL Pudding Teaspoon -- Pudding Cup -- Honey Teaspoon -- Honey Cup -- Nectar Teaspoon -- Nectar Cup -- Nectar Straw -- Thin Teaspoon -- Thin Cup -- Thin Straw -- Puree -- Mechanical Soft -- Regular -- Multi-consistency -- Pill -- Cervical Esophageal Comment -- [name], M.A. [place] [facility] Services Pager [phone] Office [phone] 07/01/2020, 2:21 PM              EEG adult  Result Date: 07/02/2020 Lora Havens, MD     07/02/2020  6:23 PM Patient Name: Tyler Richards MRN: [id number] Epilepsy Attending: Lora Havens Referring Physician/Provider: Dr Rosalin Hawking Date: 07/02/2020 Duration: 24.26 mins Patient history: 69 y.o. male with history of gout admitted for left-sided weakness, right gaze, left facial droop with fall. MRI showed acute infarct at the right basal ganglia with nonprogressive hemorrhage as well as less extensive patchy acute cortical infarct in the right MCA territory EEG to evaluate for seizure. Level of alertness: Awake, asleep AEDs during EEG study: None Technical aspects: This EEG study was done with scalp electrodes positioned according to the 10-20 International system of electrode placement. Electrical activity was acquired at a sampling rate of 500Hz  and reviewed with a high frequency filter of 70Hz  and a low frequency filter of 1Hz . EEG data were recorded continuously and digitally stored. Description: The posterior dominant rhythm consists of 8 Hz activity of moderate voltage (25-35 uV) seen predominantly in posterior head regions, asymmetric ( R<L) and reactive to eye opening and eye closing. Sleep was characterized by vertex waves, sleep spindles (12 to  14 Hz), maximal frontocentral region.  EEG showed continuous 3 to 5 Hz theta-delta slowing in right hemisphere.  Hyperventilation and photic stimulation were not performed.   ABNORMALITY -Continuous slow, right hemisphere - Background asymmetry, right<left IMPRESSION: This study is suggestive of cortical dysfunction in right hemisphere consistent with underlying stroke. No seizures or epileptiform discharges were seen throughout the recording. Lora Havens    Patient Profile     Tyler Richards is a 69 y.o. male with a history of gout, HTN, HLD, and carotid artery disease with admission for CVA who is being seen today for the evaluation of bradycardia at the request of Dr. Erlinda Hong.  Assessment & Plan    1.  Bradycardia Bradycardia at baseline, but conduction stable.  Notes from 2016 show HRs in 50s. 10/7 while transferring pt had a 7.8 pause accompanied by loss of consciousness. There is a concern for barorecepter irritation given carotid stenting.   Discussed with CCM, Neuro, and GI team. Suspect that source of bradycardia is extensive  manipulation of carotid artery and bulb. Hemodynamic effects of baroreceptor trauma can be seen for days after the procedure but are typically transient.   He is not urgently indicated for a colonoscopy, and thus his brilinta has been held and will continue to follow.  All teams agree on continued conservative monitoring at this point from a HR perspective. If patient begins to bleed acutely and GI plans for urgent colonoscopy, recommend temporary pacing.   We will continue to follow his conduction for improvement. Avoid AV nodal blockade   2. 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 LE dopplers negative.  S/p bilateral common carotid arteriogram followed by revascularization 10/4. Right ICA remained closed even after stenting.    3. HTN Has been running low post CVA.  Remains on low dose NE.   4. BRBPR Brilinta and heparin held.  Continue ASA  per neuro.  Discussion above in #1 concerning colonoscopy. GI to follow conservatively at this time.     For questions or updates, please contact [place] Please consult [email or web address] for contact info under Cardiology/STEMI.  Signed, Shirley Friar, PA-C  07/03/2020, 9:02 AM    -------------------------------------------------------------------------------------------------- I have seen, examined the patient, and reviewed/edited the above assessment and plan.    1. Bradycardia/Hypotension Likely due to extensive carotid bulb manipulation during the recent interventional procedure. These hemodynamic effects are typically transient so will continue to monitor for improvement. Discussed case with neuro team who are in agreement with conservative management.  If no improvement in the coming days and patient felt to continue have symptomatic bradycardia, can discuss permanent pacemaker implant.  2. GI bleed BRBPR. GI following. No plans for emergent colonoscopy for now. If the patient develops an indication for urgent colonoscopy and bradycardia limits sedation, can place a temporary transvenous pacemaker.   Plan discussed with GI, CCM and Neuro.  Vickie Epley, MD 07/03/2020 5:21 PM

## 2020-07-03 NOTE — Progress Notes (Signed)
Physical Therapy Treatment Patient Details Name: Tyler Richards MRN: [id number] DOB: [birth year] Today's Date: 07/03/2020    History of Present Illness Patient is a 69 y/o male who presents with left sided weakness, right gaze and slurred speech. NIH:17. Head CT- dense Right MCA infarct. CTA- Right ICA and MCA occlusions. s/p right MCA thrombectomy and right ICA stent placement 06/29/20 with post procedure hemmorhage. No PMH.    PT Comments    Patient progressing this session with sitting balance, midline orientation and activity tolerance.  Still with BP below 100 initially in sitting, but was asymptomatic.  Limited to EOB due to episode of bradycardia, hypotension and unresponsiveness yesterday with standing and OOB to chair.  Continue to feel he is appropriate for CIR level rehab at d/c.  PT to continue to follow.   Follow Up Recommendations  CIR;Supervision for mobility/OOB     Equipment Recommendations  Other (comment) (to be assessed)    Recommendations for Other Services       Precautions / Restrictions Precautions Precautions: Fall Precaution Comments: watch HR, becomes bradycardic, watch BP, can get hypotensive Restrictions Weight Bearing Restrictions: No    Mobility  Bed Mobility Overal bed mobility: Needs Assistance Bed Mobility: Supine to Sit;Sit to Supine     Supine to sit: Mod assist Sit to supine: Max assist   General bed mobility comments: VCs for supine>sit bed mobility sequencing (legs with increased cues for LLE, reach for rail with RUE, bring up trunk); sit>supine A for trunk and legs  Transfers                 General transfer comment: Did not stand or get OOB at request of RN due to vagal response yesterday and cardiac following  Ambulation/Gait                 Stairs             Wheelchair Mobility    Modified Rankin (Stroke Patients Only) Modified Rankin (Stroke Patients Only) Pre-Morbid Rankin Score: No symptoms Modified  Rankin: Severe disability     Balance Overall balance assessment: Needs assistance Sitting-balance support: Bilateral upper extremity supported Sitting balance-Leahy Scale: Poor Sitting balance - Comments: min guard A-mod A with left lateral lean, mainly self correct with cues; howecer he was able to self correct at times without cues (however had to be more than ~1/4 away from midline before he may self correct on his own); definitely more issues with sitting balance when a task was added (ie: combing hair, brushing teeth)--did not tend to self correct unless cued                                    Cognition Arousal/Alertness: Awake/alert Behavior During Therapy: WFL for tasks assessed/performed Overall Cognitive Status: Impaired/Different from baseline Area of Impairment: Safety/judgement;Following commands;Problem solving                   Current Attention Level: Sustained   Following Commands: Follows one step commands with increased time Safety/Judgement: Decreased awareness of safety;Decreased awareness of deficits Awareness: Emergent Problem Solving: Slow processing;Difficulty sequencing;Requires verbal cues;Requires tactile cues General Comments: following directions better today (only one episode of distractablitiy sitting EOB--looking a RN throught small window), showing increased awareness of sitting balance (with and without cues)      Exercises Other Exercises Other Exercises: Wife reports she has been doing PROM at on  his LUE. I feel pt would benefit from a resting hand splint--Dr. Erlinda Hong has given be a verbal order through chat text, I will order today.    General Comments General comments (skin integrity, edema, etc.): SBP supine 120, sitting initially 96 sitting about 7 min 99, sitting about 15 min 116, HR a low as 49, deniess dizziness      Pertinent Vitals/Pain Pain Assessment: Faces Faces Pain Scale: Hurts a little bit Pain Location: L LE  with pressure, repositioning Pain Descriptors / Indicators: Sore Pain Intervention(s): Monitored during session;Repositioned    Home Living                      Prior Function            PT Goals (current goals can now be found in the care plan section) Progress towards PT goals: Progressing toward goals    Frequency    Min 4X/week      PT Plan Current plan remains appropriate    Co-evaluation PT/OT/SLP Co-Evaluation/Treatment: Yes Reason for Co-Treatment: To address functional/ADL transfers;For patient/therapist safety          AM-PAC PT "6 Clicks" Mobility   Outcome Measure  Help needed turning from your back to your side while in a flat bed without using bedrails?: A Lot Help needed moving from lying on your back to sitting on the side of a flat bed without using bedrails?: Total Help needed moving to and from a bed to a chair (including a wheelchair)?: Total Help needed standing up from a chair using your arms (e.g., wheelchair or bedside chair)?: Total Help needed to walk in hospital room?: Total Help needed climbing 3-5 steps with a railing? : Total 6 Click Score: 7    End of Session   Activity Tolerance: Patient limited by fatigue Patient left: in bed;with call bell/phone within reach;with bed alarm set   PT Visit Diagnosis: Hemiplegia and hemiparesis;Other abnormalities of gait and mobility (R26.89);Muscle weakness (generalized) (M62.81) Hemiplegia - Right/Left: Left Hemiplegia - dominant/non-dominant: Non-dominant Hemiplegia - caused by: Cerebral infarction     Time: 0923-0959 PT Time Calculation (min) (ACUTE ONLY): 36 min  Charges:  $Therapeutic Activity: 8-22 mins                     Magda Kiel, PT Acute Rehabilitation Services Pager:[phone] Office:[phone] 07/03/2020\   [name] 07/03/2020, 12:43 PM

## 2020-07-04 ENCOUNTER — Encounter (HOSPITAL_COMMUNITY): Payer: Self-pay | Admitting: Neurology

## 2020-07-04 DIAGNOSIS — I6601 Occlusion and stenosis of right middle cerebral artery: Secondary | ICD-10-CM | POA: Diagnosis not present

## 2020-07-04 DIAGNOSIS — T457X5A Adverse effect of anticoagulant antagonists, vitamin K and other coagulants, initial encounter: Secondary | ICD-10-CM | POA: Diagnosis not present

## 2020-07-04 DIAGNOSIS — R001 Bradycardia, unspecified: Secondary | ICD-10-CM | POA: Diagnosis not present

## 2020-07-04 DIAGNOSIS — K922 Gastrointestinal hemorrhage, unspecified: Secondary | ICD-10-CM | POA: Diagnosis not present

## 2020-07-04 NOTE — Progress Notes (Signed)
Occupational Therapy Treatment Patient Details Name: Tyler Richards MRN: [id number] DOB: [birth year] Today's Date: 07/04/2020    History of present illness Patient is a 69 y/o male who presents with left sided weakness, right gaze and slurred speech. NIH:17. Head CT- dense Right MCA infarct. CTA- Right ICA and MCA occlusions. s/p right MCA thrombectomy and right ICA stent placement 06/29/20 with post procedure hemmorhage. No PMH.   OT comments  Pt making gradual progress towards OT goals, pleasant and agreeable to OT treatment session. Pt now with trace movement noted in LUE elbow extensors. He continues to present with L lateral lean and requiring two person assist for completion of mobility tasks at this time including sit<>stand attempts (able to clear buttocks but unable to achieve full stand). Pt able to locate ADL items in L visual field given mod cues to do so for completion of grooming ADL at bed level. Vitals monitored and below. Pt also with L resting hand splint ordered however incorrect splint delivered to room. Contacted [employer] this AM and tech to call [employer] to have correct splint delivered. Will follow up after splint delivery to establish wear schedule. Will continue per POC at this time.  BP supine 119/72, HR 55-59 Seated: 109/65, HR 60s-80s Return to supine end of session: 108/64, HR 50s   Of note returned to room approx noon today, resting hand splint not yet delivered - will continue to follow up.    Follow Up Recommendations  CIR;Supervision/Assistance - 24 hour    Equipment Recommendations  3 in 1 bedside commode;Wheelchair (measurements OT);Wheelchair cushion (measurements OT);Other (comment) (TBD in next venue)          Precautions / Restrictions Precautions Precautions: Fall Precaution Comments: monitor HR and BP - watch for bradycardia Restrictions Weight Bearing Restrictions: No       Mobility Bed Mobility Overal bed mobility: Needs Assistance Bed  Mobility: Sit to Supine;Rolling;Sidelying to Sit Rolling: Min assist Sidelying to sit: Mod assist;+2 for physical assistance   Sit to supine: Mod assist;+2 for physical assistance   General bed mobility comments: increased time and effort needed, pt rolling towards his L side with use of L UE on bed rail, assistance needed to elevate trunk and to return LEs onto bed  Transfers Overall transfer level: Needs assistance Equipment used: 2 person hand held assist Transfers: Sit to/from Stand;Lateral/Scoot Transfers Sit to Stand: Max assist;+2 physical assistance         General transfer comment: pt unable to achieve full, upright standing position, but was able to clear buttocks from bed with heavy max A x2 and use of bed pad. Pt then able to scoot laterally towards his L side twice with max A x2    Balance Overall balance assessment: Needs assistance Sitting-balance support: Single extremity supported;Feet supported Sitting balance-Leahy Scale: Poor Sitting balance - Comments: fluctuated from needing mod A to close min guard with 1UE support   Standing balance support: During functional activity Standing balance-Leahy Scale: Zero Standing balance comment: max A of 2 required to partially stand                           ADL either performed or assessed with clinical judgement   ADL Overall ADL's : Needs assistance/impaired     Grooming: Brushing hair;Set up;Bed level Grooming Details (indicate cue type and reason): performed after return to bed given fatigue with EOB activity  Functional mobility during ADLs: +2 for physical assistance;+2 for safety/equipment;Maximal assistance (sit<>stand attempts )       Vision   Additional Comments: continues to require cues to attend to L visual field to locate grooming item. able to locate clock on the wall but reading time incorrect, stating it was "16 after 3" when it was actually 9:16     Perception     Praxis      Cognition Arousal/Alertness: Awake/alert Behavior During Therapy: WFL for tasks assessed/performed Overall Cognitive Status: Impaired/Different from baseline Area of Impairment: Attention;Following commands;Safety/judgement;Awareness;Problem solving                   Current Attention Level: Sustained   Following Commands: Follows one step commands with increased time Safety/Judgement: Decreased awareness of safety;Decreased awareness of deficits Awareness: Emergent Problem Solving: Slow processing;Difficulty sequencing;Requires verbal cues;Requires tactile cues          Exercises Exercises: General Lower Extremity General Exercises - Upper Extremity Elbow Flexion: PROM;5 reps Elbow Extension: PROM;AAROM;Right;5 reps General Exercises - Lower Extremity Long Arc Quad: AAROM;Left;10 reps;Seated Hip Flexion/Marching: AROM;Strengthening;Left;10 reps;Seated Other Exercises Other Exercises: pt with orders for resting hand splint however incorrect splint delivered to room; called [employer] and tech to call [employer] to deliver proper splint    Shoulder Instructions       General Comments      Pertinent Vitals/ Pain       Pain Assessment: Faces Faces Pain Scale: Hurts little more Pain Location: L medial aspect of knee when in bed, no c/o pain with WB'ing Pain Descriptors / Indicators: Sore Pain Intervention(s): Monitored during session;Repositioned  Home Living                                          Prior Functioning/Environment              Frequency  Min 2X/week        Progress Toward Goals  OT Goals(current goals can now be found in the care plan section)  Progress towards OT goals: Progressing toward goals  Acute Rehab OT Goals Patient Stated Goal: to return to PLOF OT Goal Formulation: With patient Time For Goal Achievement: 07/14/20 Potential to Achieve Goals: Good ADL Goals Pt Will Perform  Grooming: with min assist;sitting Pt Will Perform Lower Body Bathing: with mod assist;sitting/lateral leans;sit to/from stand Pt Will Perform Upper Body Dressing: with min assist;sitting Pt Will Perform Lower Body Dressing: with mod assist;sit to/from stand;sitting/lateral leans Pt Will Transfer to Toilet: with mod assist;stand pivot transfer;bedside commode Pt Will Perform Toileting - Clothing Manipulation and hygiene: with mod assist;sit to/from stand Pt/caregiver will Perform Home Exercise Program: Increased strength;Increased ROM;Left upper extremity;With minimal assist;With written HEP provided Additional ADL Goal #1: Pt will attend to L visual field/L side of body during ADL task with no more than min cues. Additional ADL Goal #2: Pt will maintain static sitting balance >5 min at supervision level as precursor to ADL.  Plan Discharge plan remains appropriate    Co-evaluation    PT/OT/SLP Co-Evaluation/Treatment: Yes Reason for Co-Treatment: For patient/therapist safety;To address functional/ADL transfers PT goals addressed during session: Mobility/safety with mobility;Balance;Strengthening/ROM OT goals addressed during session: ADL's and self-care      AM-PAC OT "6 Clicks" Daily Activity     Outcome Measure   Help from another person eating meals?: A Lot Help from another person taking  care of personal grooming?: A Lot Help from another person toileting, which includes using toliet, bedpan, or urinal?: Total Help from another person bathing (including washing, rinsing, drying)?: A Lot Help from another person to put on and taking off regular upper body clothing?: A Lot Help from another person to put on and taking off regular lower body clothing?: Total 6 Click Score: 10    End of Session Equipment Utilized During Treatment: Gait belt  OT Visit Diagnosis: Other abnormalities of gait and mobility (R26.89);Hemiplegia and hemiparesis;Other symptoms and signs involving cognitive  function;Other symptoms and signs involving the nervous system (R29.898) Hemiplegia - Right/Left: Left Hemiplegia - dominant/non-dominant: Non-Dominant Hemiplegia - caused by: Cerebral infarction   Activity Tolerance Patient tolerated treatment well   Patient Left in bed;with call bell/phone within reach;with family/visitor present   Nurse Communication Mobility status        Time: 2060-1561 OT Time Calculation (min): 31 min  Charges: OT General Charges $OT Visit: 1 Visit OT Treatments $Self Care/Home Management : 8-22 mins  Lou Cal, OT Acute Rehabilitation Services Pager [phone] Office [phone]    Raymondo Band 07/04/2020, 12:19 PM

## 2020-07-04 NOTE — Progress Notes (Signed)
Physical Therapy Treatment Patient Details Name: Tyler Richards MRN: [id number] DOB: 07/27/1951 Today's Date: 07/04/2020    History of Present Illness Patient is a 69 y/o male who presents with left sided weakness, right gaze and slurred speech. NIH:17. Head CT- dense Right MCA infarct. CTA- Right ICA and MCA occlusions. s/p right MCA thrombectomy and right ICA stent placement 06/29/20 with post procedure hemmorhage. No PMH.    PT Comments    Pt seen for mobility progression. He continues to remain very limited and weak overall (L side more weak that R) and continues to require heavy physical assistance of two for all mobility. Attempted to stand from EOB this session but was unable to achieve a full, upright standing position. He was asymptomatic throughout with all VSS. Wife was present throughout and very encouraging. Pt would continue to benefit from skilled physical therapy services at this time while admitted and after d/c to address the below listed limitations in order to improve overall safety and independence with functional mobility.    Follow Up Recommendations  CIR     Equipment Recommendations  Other (comment) (defer to next venue of care)    Recommendations for Other Services       Precautions / Restrictions Precautions Precautions: Fall Precaution Comments: monitor HR and BP - watch for bradycardia Restrictions Weight Bearing Restrictions: No    Mobility  Bed Mobility Overal bed mobility: Needs Assistance Bed Mobility: Sit to Supine;Rolling;Sidelying to Sit Rolling: Min assist Sidelying to sit: Mod assist;+2 for physical assistance   Sit to supine: Mod assist;+2 for physical assistance   General bed mobility comments: increased time and effort needed, pt rolling towards his L side with use of L UE on bed rail, assistance needed to elevate trunk and to return LEs onto bed  Transfers Overall transfer level: Needs assistance Equipment used: 2 person hand held  assist Transfers: Sit to/from Stand;Lateral/Scoot Transfers Sit to Stand: Max assist;+2 physical assistance         General transfer comment: pt unable to achieve full, upright standing position, but was able to clear buttocks from bed with heavy max A x2 and use of bed pad. Pt then able to scoot laterally towards his L side twice with max A x2  Ambulation/Gait                 Stairs             Wheelchair Mobility    Modified Rankin (Stroke Patients Only)       Balance Overall balance assessment: Needs assistance Sitting-balance support: Single extremity supported;Feet supported Sitting balance-Leahy Scale: Poor Sitting balance - Comments: fluctuated from needing mod A to close min guard with 1UE support   Standing balance support: During functional activity Standing balance-Leahy Scale: Zero Standing balance comment: max A of 2 required to partially stand                            Cognition Arousal/Alertness: Awake/alert Behavior During Therapy: WFL for tasks assessed/performed Overall Cognitive Status: Impaired/Different from baseline Area of Impairment: Attention;Following commands;Safety/judgement;Awareness;Problem solving                   Current Attention Level: Sustained   Following Commands: Follows one step commands with increased time Safety/Judgement: Decreased awareness of safety;Decreased awareness of deficits Awareness: Emergent Problem Solving: Slow processing;Difficulty sequencing;Requires verbal cues;Requires tactile cues        Exercises General Exercises -  Lower Extremity Long Arc Quad: AAROM;Left;10 reps;Seated Hip Flexion/Marching: AROM;Strengthening;Left;10 reps;Seated    General Comments        Pertinent Vitals/Pain Pain Assessment: Faces Faces Pain Scale: Hurts little more Pain Location: L medial aspect of knee when in bed, no c/o pain with WB'ing Pain Descriptors / Indicators: Sore Pain  Intervention(s): Monitored during session;Repositioned    Home Living                      Prior Function            PT Goals (current goals can now be found in the care plan section) Acute Rehab PT Goals PT Goal Formulation: With patient/family Time For Goal Achievement: 07/14/20 Potential to Achieve Goals: Good Progress towards PT goals: Progressing toward goals    Frequency    Min 4X/week      PT Plan Current plan remains appropriate    Co-evaluation PT/OT/SLP Co-Evaluation/Treatment: Yes Reason for Co-Treatment: For patient/therapist safety;To address functional/ADL transfers PT goals addressed during session: Mobility/safety with mobility;Balance;Strengthening/ROM        AM-PAC PT "6 Clicks" Mobility   Outcome Measure  Help needed turning from your back to your side while in a flat bed without using bedrails?: A Little Help needed moving from lying on your back to sitting on the side of a flat bed without using bedrails?: A Lot Help needed moving to and from a bed to a chair (including a wheelchair)?: Total Help needed standing up from a chair using your arms (e.g., wheelchair or bedside chair)?: Total Help needed to walk in hospital room?: Total Help needed climbing 3-5 steps with a railing? : Total 6 Click Score: 9    End of Session Equipment Utilized During Treatment: Gait belt Activity Tolerance: Patient limited by fatigue Patient left: in bed;with call bell/phone within reach;with bed alarm set;with family/visitor present;with SCD's reapplied Nurse Communication: Mobility status PT Visit Diagnosis: Hemiplegia and hemiparesis;Other abnormalities of gait and mobility (R26.89);Muscle weakness (generalized) (M62.81) Hemiplegia - Right/Left: Left Hemiplegia - dominant/non-dominant: Non-dominant Hemiplegia - caused by: Cerebral infarction     Time: 7353-2992 PT Time Calculation (min) (ACUTE ONLY): 32 min  Charges:  $Therapeutic Activity: 8-22  mins                     Anastasio Champion, DPT  Acute Rehabilitation Services Pager [phone] Office [place] 07/04/2020, 10:51 AM

## 2020-07-04 NOTE — Progress Notes (Signed)
STROKE TEAM PROGRESS NOTE   SUBJECTIVE (INTERVAL HISTORY) Wife at bedside.  Patient reclining in bed, awake alert, neuro stable.  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  BP 110s, of [place] this am. talked with patient and wife, will hold off colonoscopy until patient hypotension and bradycardia improved.  Close monitoring for H&H and bowel movement.  Neuro exam unchanged. Hematocrit stable at 30.6 this morning OBJECTIVE Temp:  [98.5 F (36.9 C)-99.2 F (37.3 C)] 98.6 F (37 C) (10/09 0800) Pulse Rate:  [44-66] 64 (10/09 0800) Cardiac Rhythm: Sinus bradycardia (10/08 2000) Resp:  [12-29] 12 (10/09 0800) BP: (99-177)/(50-77) 144/77 (10/09 0800) SpO2:  [95 %-100 %] 98 % (10/09 0800)  Recent Labs  Lab 06/29/20 0904  GLUCAP 88   Recent Labs  Lab 06/29/20 0904 06/29/20 0904 06/29/20 0915 06/29/20 0915 06/29/20 1738 06/30/20 0043 06/30/20 0043 07/01/20 0433 07/02/20 0424 07/03/20 0311  NA 136   < > 138   < > 138 138  --  138 135 135  K 5.0   < > 4.5   < > 3.8 3.9  --  3.6 3.4* 3.5  CL 103   < > 105  --   --  106  --  106 104 105  CO2 20*  --   --   --   --  20*  --  21* 22 21*  GLUCOSE 104*   < > 105*  --   --  113*  --  107* 103* 102*  BUN 14   < > 17  --   --  10  --  8 11 12   CREATININE 0.87   < > 0.70  --   --  0.89  --  0.85 0.79 0.77  CALCIUM 9.1   < >  --   --   --  8.4*   < > 8.5* 8.4* 8.2*   < > = values in this interval not displayed.   Recent Labs  Lab 06/29/20 0904  AST 36  ALT 17  ALKPHOS 61  BILITOT 1.8*  PROT 6.5  ALBUMIN 3.3*   Recent Labs  Lab 06/29/20 0904 06/29/20 0915 06/30/20 0043 07/01/20 0433 07/02/20 0424 07/02/20 2310 07/03/20 0311  WBC 9.0  --  9.7 11.2* 10.5  --  9.8  NEUTROABS 6.8  --  7.2  --   --   --   --   HGB 13.9   < > 12.2* 11.7* 10.1* 10.7* 10.3*  HCT 41.2   < > 36.2* 34.2* 30.1* 31.7* 30.6*  MCV 96.5  --  97.1 99.1 96.2  --  96.2  PLT 320  --  380 329 287  --  336   < > = values in this interval not  displayed.   No results for input(s): COLORURINE, LABSPEC, [place], GLUCOSEU, HGBUR, BILIRUBINUR, KETONESUR, PROTEINUR, UROBILINOGEN, NITRITE, LEUKOCYTESUR in the last 72 hours.  Invalid input(s): APPERANCEUR     Component Value Date/Time   CHOL 149 06/30/2020 0043   TRIG 77 07/03/2020 0311   HDL 23 (L) 06/30/2020 0043   CHOLHDL 6.5 06/30/2020 0043   VLDL 33 06/30/2020 0043   LDLCALC 93 06/30/2020 0043   Lab Results  Component Value Date   HGBA1C 5.6 06/30/2020      Component Value Date/Time   LABOPIA NONE DETECTED 06/29/2020 1749   COCAINSCRNUR NONE DETECTED 06/29/2020 1749   LABBENZ NONE DETECTED 06/29/2020 1749   AMPHETMU NONE DETECTED 06/29/2020 1749   THCU NONE DETECTED  06/29/2020 1749   LABBARB NONE DETECTED 06/29/2020 1749    Recent Labs  Lab 06/29/20 0904  ETH <10    I have personally reviewed the radiological images below and agree with the radiology interpretations.  CT Code Stroke CTA Head W/WO contrast  Result Date: 06/29/2020 CLINICAL DATA:  Hyperdense vessel and stroke-like symptoms EXAM: CT ANGIOGRAPHY HEAD AND NECK CT PERFUSION BRAIN TECHNIQUE: Multidetector CT imaging of the head and neck was performed using the standard protocol during bolus administration of intravenous contrast. Multiplanar CT image reconstructions and MIPs were obtained to evaluate the vascular anatomy. Carotid stenosis measurements (when applicable) are obtained utilizing NASCET criteria, using the distal internal carotid diameter as the denominator. Multiphase CT imaging of the brain was performed following IV bolus contrast injection. Subsequent parametric perfusion maps were calculated using RAPID software. CONTRAST:  Dose is not yet known COMPARISON:  None. FINDINGS: CTA NECK FINDINGS Aortic arch: Mild atheromatous changes. Three vessel branching. No acute finding. Right carotid system: Atherosclerotic plaque about the bifurcation with occluded ICA bulb. No flow seen within the ICA in  the neck. Left carotid system: Atheromatous plaque at the bifurcation and bulb. No stenosis or ulceration. Negative for beading. Vertebral arteries: Low-density plaque at the left subclavian origin. No flow limiting subclavian stenosis. Plaque at the left vertebral origin without stenosis, beading, or dissection. Skeleton: Focal advanced C5-6 disc degeneration with ridging causing biforaminal impingement. Ordinary facet osteoarthritis. Other neck: No acute or aggressive finding. Right-sided chronic sinusitis with pattern middle meatus obstruction. Upper chest: Granulomatous nodal calcifications. There is also a calcified granuloma appearance in the left upper lobe. Review of the MIP images confirms the above findings CTA HEAD FINDINGS Anterior circulation: No flow seen in the right carotid or MCA branches. There is distal M2 M3 branch reconstitution. Calcified plaque along the left carotid siphon. No MCA branch occlusion, aneurysm, or beading. Posterior circulation: The vertebral and basilar arteries are smooth and widely patent. High-grade atheromatous narrowings of the bilateral PCA, P3 segment on the right and upper first order branch on the left. Venous sinuses: Patent as permitted by contrast timing Anatomic variants: None significant Review of the MIP images confirms the above findings CT Brain Perfusion Findings: ASPECTS: 9 CBF (<30%) Volume: 68mL Perfusion (Tmax>6.0s) volume: 119mL Mismatch Volume: 124mL Infarction Location:Right basal ganglia and lesser extensive lateral frontal cortex infarct. Critical Value/emergent results were called by telephone at the time of interpretation on 06/29/2020 at 9:37 am to provider ERIC [facility] , who verbally acknowledged these results. IMPRESSION: 1. Emergent large vessel occlusion with no flow seen in the right internal carotid or proximal MCA. There is a 36 cc core infarct and 142 cc of penumbra by CT perfusion. 2. Atherosclerosis without flow limiting stenosis of the  other major vessels. 3. Bilateral high-grade atheromatous PCA narrowings, more proximal on the right. 4. 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 Electronically Signed   By: Monte Fantasia M.D.   On: 06/29/2020 09:53   CT HEAD WO CONTRAST  Result Date: 07/01/2020 CLINICAL DATA:  Follow-up intracranial hemorrhage EXAM: CT HEAD WITHOUT CONTRAST TECHNIQUE: Contiguous axial images were obtained from the base of the skull through the vertex without intravenous contrast. COMPARISON:  CT from 2 days ago FINDINGS: Brain: More contracted and homogeneous hematoma in the right basal ganglia measuring up to 2.8 x 2 cm. Cytotoxic edema from infarct has become apparent in the right basal ganglia and adjacent frontal insular cortex. No interval hemorrhage, hydrocephalus, or midline shift. Vascular: The  right ICA is hyperdense below the skull base, unchanged. Skull: No acute or aggressive finding Sinuses/Orbits: Chronic right maxillary, ethmoid, and frontal sinusitis. Generalized mucosal thickening seen elsewhere, progressed. IMPRESSION: 1. Interval contraction of the right basal ganglia hematoma. 2. Expected evolution of the right frontoinsular infarcts. 3. 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 4. No new abnormality. Electronically Signed   By: Monte Fantasia M.D.   On: 07/01/2020 07:16   CT HEAD WO CONTRAST  Result Date: 06/29/2020 CLINICAL DATA:  Follow-up stroke and subsequent intervention. EXAM: CT HEAD WITHOUT CONTRAST TECHNIQUE: Contiguous axial images were obtained from the base of the skull through the vertex without intravenous contrast. COMPARISON:  Earlier same day FINDINGS: Brain: 3.9 x 3.0 x 3.0 cm hyperdense region in the right posterior basal ganglia most consistent with a parenchymal hematoma, given small internal fluid levels. Mild mass effect and surrounding edema. No midline shift. Chronic ischemic changes in the right posterior frontal region as seen  previously. Vascular: No other new vascular finding. Skull: Negative Sinuses/Orbits: Opacified right maxillary and ethmoid sinuses. Orbits negative. Other: None IMPRESSION: 1. 3.9 x 3.0 x 3.0 cm hyperdense region in the right posterior basal ganglia most consistent with parenchymal hematoma, given small internal fluid levels. Mild mass effect and surrounding edema. No midline shift. 2. Chronic ischemic changes in the right posterior frontal region as seen previously. 3. Opacified right maxillary and ethmoid sinuses. Electronically Signed   By: Nelson Chimes M.D.   On: 06/29/2020 18:48   CT Code Stroke CTA Neck W/WO contrast  Result Date: 06/29/2020 CLINICAL DATA:  Hyperdense vessel and stroke-like symptoms EXAM: CT ANGIOGRAPHY HEAD AND NECK CT PERFUSION BRAIN TECHNIQUE: Multidetector CT imaging of the head and neck was performed using the standard protocol during bolus administration of intravenous contrast. Multiplanar CT image reconstructions and MIPs were obtained to evaluate the vascular anatomy. Carotid stenosis measurements (when applicable) are obtained utilizing NASCET criteria, using the distal internal carotid diameter as the denominator. Multiphase CT imaging of the brain was performed following IV bolus contrast injection. Subsequent parametric perfusion maps were calculated using RAPID software. CONTRAST:  Dose is not yet known COMPARISON:  None. FINDINGS: CTA NECK FINDINGS Aortic arch: Mild atheromatous changes. Three vessel branching. No acute finding. Right carotid system: Atherosclerotic plaque about the bifurcation with occluded ICA bulb. No flow seen within the ICA in the neck. Left carotid system: Atheromatous plaque at the bifurcation and bulb. No stenosis or ulceration. Negative for beading. Vertebral arteries: Low-density plaque at the left subclavian origin. No flow limiting subclavian stenosis. Plaque at the left vertebral origin without stenosis, beading, or dissection. Skeleton: Focal  advanced C5-6 disc degeneration with ridging causing biforaminal impingement. Ordinary facet osteoarthritis. Other neck: No acute or aggressive finding. Right-sided chronic sinusitis with pattern middle meatus obstruction. Upper chest: Granulomatous nodal calcifications. There is also a calcified granuloma appearance in the left upper lobe. Review of the MIP images confirms the above findings CTA HEAD FINDINGS Anterior circulation: No flow seen in the right carotid or MCA branches. There is distal M2 M3 branch reconstitution. Calcified plaque along the left carotid siphon. No MCA branch occlusion, aneurysm, or beading. Posterior circulation: The vertebral and basilar arteries are smooth and widely patent. High-grade atheromatous narrowings of the bilateral PCA, P3 segment on the right and upper first order branch on the left. Venous sinuses: Patent as permitted by contrast timing Anatomic variants: None significant Review of the MIP images confirms the above findings CT Brain Perfusion  Findings: ASPECTS: 9 CBF (<30%) Volume: 13mL Perfusion (Tmax>6.0s) volume: 159mL Mismatch Volume: 14mL Infarction Location:Right basal ganglia and lesser extensive lateral frontal cortex infarct. Critical Value/emergent results were called by telephone at the time of interpretation on 06/29/2020 at 9:37 am to provider ERIC [facility] , who verbally acknowledged these results. IMPRESSION: 1. Emergent large vessel occlusion with no flow seen in the right internal carotid or proximal MCA. There is a 36 cc core infarct and 142 cc of penumbra by CT perfusion. 2. Atherosclerosis without flow limiting stenosis of the other major vessels. 3. Bilateral high-grade atheromatous PCA narrowings, more proximal on the right. 4. 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 Electronically Signed   By: Monte Fantasia M.D.   On: 06/29/2020 09:53   MR BRAIN WO CONTRAST  Result Date: 06/30/2020 CLINICAL DATA:  Stroke  follow-up EXAM: MRI HEAD WITHOUT CONTRAST TECHNIQUE: Multiplanar, multiecho pulse sequences of the brain and surrounding structures were obtained without intravenous contrast. COMPARISON:  Head CT and CTA from yesterday FINDINGS: Brain: Acute infarction at the right stratum and patchy along the insula and lateral frontal cortex. The area of infarction appears similar to the area of pre treatment core infarct. Hematoma centered at the right putamen with dimensions better assessed by prior CT, grossly similar at to 3.5 cm anterior to posterior. Correlating to fluid fluid level, there is a posterior dense clot appearance and more heterogeneous anterior component. On gradient imaging petechial hemorrhage is seen along the right MCA watershed. No hydrocephalus or shift. Remote high right frontal cortex infarct anteriorly. Vascular: No flow voids seen in the left ICA beginning in the upper cervical spine. There is normalization by the supraclinoid segment. Skull and upper cervical spine: No focal marrow lesion Sinuses/Orbits: Right middle meatus obstruction with frontal, ethmoid, and especially maxillary sinusitis on the right. IMPRESSION: 1. Acute infarct at the right basal ganglia with nonprogressive hemorrhage when correlated with prior CT. Less extensive patchy acute cortical infarct in the right MCA territory. The areas of acute infarction correlate well with pretreatment CTP. 2. 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 Electronically Signed   By: Monte Fantasia M.D.   On: 06/30/2020 04:22   IR INTRAVSC STENT CERV CAROTID W/O EMB-PROT MOD SED  Result Date: 07/02/2020 INDICATION: New onset right gaze deviation, left hemiplegia, and left-sided neglect. EXAM: 1. EMERGENT LARGE VESSEL OCCLUSION THROMBOLYSIS anterior CIRCULATION) COMPARISON:  CT angiogram of the head and neck of June 29, 2020. MEDICATIONS: Ancef 2 g IV was administered within 1 hour of the procedure.  ANESTHESIA/SEDATION: General anesthesia CONTRAST:  Isovue 300 approximately 165 mL FLUOROSCOPY TIME:  Fluoroscopy Time: 97 minutes 0 seconds (3622 mGy). COMPLICATIONS: None immediate. TECHNIQUE: Following a full explanation of the procedure along with the potential associated complications, an informed witnessed consent was obtained from the spouse. The risks of intracranial hemorrhage of 10%, worsening neurological deficit, ventilator dependency, death and inability to revascularize were all reviewed in detail with the patient's spouse. The patient was then put under general anesthesia by the Department of Anesthesiology at [facility]. The right groin was prepped and draped in the usual sterile fashion. Thereafter using modified Seldinger technique, transfemoral access into the right common femoral artery was obtained without difficulty. Over a 0.035 inch guidewire an 8 French 25 Pinnacle sheath was inserted. Through this, and also over a 0.035 inch guidewire a 5 French 125 cm Berenstein select inside of the balloon guide catheter combinationwas advanced to  the aortic arch region and selectively positioned in the distal right common carotid artery. Guidewire, and the select catheter removed. Good aspiration obtained from the hub of the balloon guide catheter in the right common carotid artery. Arteriogram was then performed centered extra cranially and intracranially. FINDINGS: The right common carotid arteriogram demonstrates the right external carotid artery and its major branches to be widely patent. The right internal carotid artery at the bulb demonstrates a moderate sized plaque associated with complete occlusion just distal to the bulb. No evidence of distal angiographic string sign, or reconstitution of right internal carotid artery in the cavernous segment was noted. PROCEDURE: Through the balloon guide catheter, a combination of an 021 Trevo ProVue microcatheter over a 0.014 inch standard Synchro  micro guidewire with a J configuration, access through the occluded right internal carotid artery was obtained with the micro guidewire leading followed by the microcatheter. The combination was advanced to the horizontal petrous segment where the micro guidewire was removed. Poor aspiration was obtained from the microcatheter hub on account of the extensive clot in the entire right internal carotid artery intracranially and extra cranially. The microcatheter was then exchanged for a 014 inch Synchro standard 300 cm exchange micro guidewire with a J-tip configuration. A control arteriogram performed through the balloon guide demonstrated minimally improved caliber through the proximal right internal carotid artery. A 4 mm x 30 mm Viatrac 14 angioplasty balloon catheter which had been prepped with 50% contrast and 50% heparinized saline infusion and integrated with 75% contrast and 25% heparinized saline infusion was advanced over the exchange micro guidewire using the rapid exchange technique. The proximal and distal markers were then positioned in the described landing zones. A control inflation was then performed using micro inflation syringe device via micro tubing to 12 atmospheres. This was maintained for about 20 seconds and retrieved proximally. A control arteriogram performed through the balloon guide demonstrated improved caliber in the bulb but there continued be a high-grade stenosis in the proximal right internal carotid artery. This prompted a second angioplasty in the manner as described above again to 12 atmospheres for approximately 20 seconds. The balloon was then deflated and retrieved and removed. A control arteriogram performed through the balloon guide demonstrated significantly improved caliber and flow through the angioplastied segment with improved flow to the more distal right internal carotid artery. However, there continued to be extensive clot noted within the internal carotid artery at  the cranial skull base and the supraclinoid right ICA. An 021 Trevo ProVue catheter inside of a 071 134 cm aspiration catheter, combination was advanced over the exchange micro guidewire to the cavernous segment of the right internal carotid artery. Exchange micro guidewire was replaced with a regular 014 inch standard Synchro micro guidewire with a J configuration. This was then advanced using a torque device through the occluded right middle cerebral artery into one of the inferior division branches in the M2 M3 region followed by the microcatheter. The guidewire was removed. Good aspiration obtained from the hub of the microcatheter. A gentle control arteriogram performed through this demonstrated slow flow through the vessel itself. A 4 mm x 40 mm Solitaire X retrieval device was then advanced to the distal end of the microcatheter. The retriever was deployed in the usual manner. The Zoom aspiration catheter was advanced to the mid right M1 segment. Continuous aspiration was then performed at the hub of the aspiration catheter for 2-1/2 minutes, with proximal flow arrest in the right internal carotid artery. The  retrieval device, the microcatheter and the aspiration catheter were retrieved and removed. Copious amounts of clot were captured into the [place], and also in the retrieval device. Following reversal of flow arrest, a control arteriogram performed through the balloon guide catheter demonstrated now improved flow through the proximal right internal carotid artery extra cranially and intracranially. There is now patency of the right anterior cerebral artery with a stump of the occluded right middle cerebral artery. A second pass was then made again using the above combination. The microcatheter was now accessed into the superior division of the right middle cerebral artery M2 M3 region over a 0.014 inch standard Synchro micro guidewire. Free aspiration of a blood was obtained at the hub of the  microcatheter. A Tiger 21 retrieval device was then advanced to the distal end of the retrieval device. This was then deployed by retrieving the microcatheter. The proximal portion of the retrieval device was within the proximal aspect of the occluded right middle cerebral artery. Free aspiration was then started through the aspiration catheter with the Penumbra aspiration device which was positioned in the distal portion of the occluded clot. The retrieval device was then opened and closed until there was kinking at the proximal portion. The microcatheter was then advanced to the proximal marker on the retrieval device. With constant aspiration continued, the Tiger 21 retrieval device was then removed as constant aspiration was applied at the hub of the aspiration catheter, and also with proximal flow arrest and aspiration at the hub of the balloon guide with a 60 mL syringe. Multiple fragments of clot were obtained within the [place], and also entangled in the retrieval device. With reversal of the proximal flow arrest, a control arteriogram performed through the balloon guide in the right internal carotid artery now demonstrated improved opacification of the right middle cerebral artery to where there was now opacification of the anterior temporal branch. Distal branch remains occluded with no change in anterior cerebral artery. A third pass was now made using an 027 150 cm Marksman microcatheter which was advanced again using the combination of the 071 Zoom aspiration catheter over a 0.014 inch standard Synchro micro guidewire. This combination was advanced into the now the inferior division with the microcatheter advanced into the M2 region of the inferior division. The micro guidewire was removed. Good aspiration obtained from the hub of the 027 microcatheter. A gentle control arteriogram performed through this now demonstrated free flow into the distal vasculature. The aspiration catheter was engaged into  the occluded distal right middle cerebral artery at the origin of the inferior division. Aspiration was now at the hub of the aspiration catheter embedded in the right middle cerebral artery clot and also at the hub of the Marksman catheter for approximately 2 minutes, with proximal flow arrest in the right internal carotid artery and constant aspiration at its hub. The Marksman catheter was then gently retrieved while maintaining aspiration through the Zoom aspiration catheter. The Zoom aspiration catheter remained loft to aspiration. This was then retrieved and removed. Again clots were seen in the aspirate. Control arteriogram performed following reversal of flow arrest in the right internal carotid artery now demonstrated complete revascularization of the right MCA distribution, into the distal distribution achieving a TICI 2c revascularization. The right anterior cerebral artery remained widely patent. Measurements were now performed of the right internal carotid artery proximally, and the right common carotid artery distally at the proposed landing zones for the revascularization stent. An 021 Trevo ProVue  microcatheter was advanced to the petrous segment of the right internal carotid artery over a 0.014 inch standard Synchro micro guidewire, and replaced with an 014 inch 300 cm Synchro exchange micro guidewire. Safe position of the tip of the exchange micro guidewire was verified. It was elected to proceed with placement of a 6/8 mm x 40 mm Xact stent across the diseased previously occluded right internal carotid artery proximally. This was retrogradely purged with heparinized saline infusion. Using the rapid exchange technique, the delivery system of the stent was then advanced and positioned covering distally, and also the proximal portion of the landing zones. Stent was deployed in the usual manner without any difficulty. The delivery system was removed. Good aspiration obtained from the hub of the balloon  guide in the right common carotid artery. A control arteriogram performed through this demonstrated excellent positioning and apposition of the stent with now free flow noted into the intracranial right ICA and the right middle and right anterior cerebral arteries being widely patent. Prior to this patient was loaded with 81 mg aspirin, and 180 mg of Brilinta via an orogastric tube. CT scan of the brain was then performed which demonstrated contrast blush of the right basal ganglia, and also to the 3 focal areas of hyperdensity suspicious for micro hemorrhages. 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 Three quarter bolus dose of Cangrelor was then given intravenously in order to salvage the occluding stent in the right internal carotid artery. The balloon guide was then removed following removal of the exchange micro guidewire. The diagnostic catheter was then positioned in the left common carotid artery. This demonstrated the left external carotid artery and the left internal carotid arteries to be widely patent. Patency of the left internal carotid to the cranial skull base was verified. Also the left middle and the left anterior cerebral arteries were seen to opacify into the capillary and venous phases. Also almost simultaneously cross-filling via the anterior communicating artery of the right anterior and the right middle cerebral arteries is noted via the anterior communicating artery. No gross filling defects were seen in the middle cerebral artery distribution. 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 The diagnostic catheter was removed. The 8 French Pinnacle sheath was then exchanged for a  short 8 [place] sheath which in turn was then removed with the successful application of the 7 [place] ExoSeal closure device with hemostasis in the right groin. Distal pulses remained palpable in the feet bilaterally unchanged. A second CT scan of the brain demonstrated no significant change in the right basal ganglia with continued presence of at least 3 foci of hemorrhage. No mass effect or midline shift was seen. Patient was left intubated on account of patient's medical condition and inability to respond appropriately to protect the airway. Patient was then transferred to the neuro ICU for post recanalization management. IMPRESSION: Status post endovascular complete revascularization of the right middle cerebral artery and the right anterior cerebral artery with 1 pass with a Solitaire 4 mm x 40 mm X retrieval device and aspiration, and 1 pass with the Tiger 21 retrieval device with proximal aspiration, and with 1 pass with contact aspiration achieving a TICI 2c revascularization of the right middle cerebral artery distribution. 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 Attempt to rescue with 3/4 bolus dose of Cangrelor IV. PLAN: Follow-up in the clinic 4 to 6 weeks post discharge. Electronically Signed   By: Luanne Bras M.D.   On: 06/30/2020 13:20   IR CT [facility]  Result Date: 07/02/2020 INDICATION: New onset right gaze deviation, left hemiplegia, and left-sided neglect. EXAM: 1. EMERGENT LARGE VESSEL OCCLUSION THROMBOLYSIS anterior CIRCULATION) COMPARISON:  CT angiogram of the head and neck of June 29, 2020. MEDICATIONS: Ancef 2 g IV was administered within 1 hour of the procedure. ANESTHESIA/SEDATION: General anesthesia CONTRAST:  Isovue 300 approximately 165 mL FLUOROSCOPY TIME:  Fluoroscopy Time: 97 minutes 0 seconds (3622 mGy).  COMPLICATIONS: None immediate. TECHNIQUE: Following a full explanation of the procedure along with the potential associated complications, an informed witnessed consent was obtained from the spouse. The risks of intracranial hemorrhage of 10%, worsening neurological deficit, ventilator dependency, death and inability to revascularize were all reviewed in detail with the patient's spouse. The patient was then put under general anesthesia by the Department of Anesthesiology at [facility]. The right groin was prepped and draped in the usual sterile fashion. Thereafter using modified Seldinger technique, transfemoral access into the right common femoral artery was obtained without difficulty. Over a 0.035 inch guidewire an 8 French 25 Pinnacle sheath was inserted. Through this, and also over a 0.035 inch guidewire a 5 French 125 cm Berenstein select inside of the balloon guide catheter combinationwas advanced to the aortic arch region and selectively positioned in the distal right common carotid artery. Guidewire, and the select catheter removed. Good aspiration obtained from the hub of the balloon guide catheter in the right common carotid artery. Arteriogram was then performed centered extra cranially and intracranially. FINDINGS: The right common carotid arteriogram demonstrates the right external carotid artery and its major branches to be widely patent. The right internal carotid artery at the bulb demonstrates a moderate sized plaque associated with complete occlusion just distal to the bulb. No evidence of distal angiographic string sign, or reconstitution of right internal carotid artery in the cavernous segment was noted. PROCEDURE: Through the balloon guide catheter, a combination of an 021 Trevo ProVue microcatheter over a 0.014 inch standard Synchro micro guidewire with a J configuration, access through the occluded right internal carotid artery was obtained with the micro guidewire leading followed  by the microcatheter. The combination was advanced to the horizontal petrous segment where the micro guidewire was removed. Poor aspiration was obtained from the microcatheter hub on account of the extensive clot in the entire right internal carotid artery intracranially and extra cranially. The microcatheter was then exchanged for a 014 inch Synchro standard 300 cm exchange micro guidewire with a J-tip configuration. A control arteriogram performed through the balloon guide demonstrated minimally improved caliber through the proximal right internal carotid artery. A 4 mm x 30 mm Viatrac 14 angioplasty balloon catheter which had been prepped with 50% contrast and 50% heparinized saline infusion and integrated with 75% contrast and 25% heparinized saline infusion was advanced over the exchange micro guidewire using the rapid exchange technique. The proximal and distal markers were then positioned in the described landing zones. A control inflation was then performed using micro inflation syringe device via micro tubing to 12 atmospheres. This was maintained for about 20 seconds and retrieved proximally. A control arteriogram performed through the balloon guide demonstrated improved caliber  in the bulb but there continued be a high-grade stenosis in the proximal right internal carotid artery. This prompted a second angioplasty in the manner as described above again to 12 atmospheres for approximately 20 seconds. The balloon was then deflated and retrieved and removed. A control arteriogram performed through the balloon guide demonstrated significantly improved caliber and flow through the angioplastied segment with improved flow to the more distal right internal carotid artery. However, there continued to be extensive clot noted within the internal carotid artery at the cranial skull base and the supraclinoid right ICA. An 021 Trevo ProVue catheter inside of a 071 134 cm aspiration catheter, combination was advanced  over the exchange micro guidewire to the cavernous segment of the right internal carotid artery. Exchange micro guidewire was replaced with a regular 014 inch standard Synchro micro guidewire with a J configuration. This was then advanced using a torque device through the occluded right middle cerebral artery into one of the inferior division branches in the M2 M3 region followed by the microcatheter. The guidewire was removed. Good aspiration obtained from the hub of the microcatheter. A gentle control arteriogram performed through this demonstrated slow flow through the vessel itself. A 4 mm x 40 mm Solitaire X retrieval device was then advanced to the distal end of the microcatheter. The retriever was deployed in the usual manner. The Zoom aspiration catheter was advanced to the mid right M1 segment. Continuous aspiration was then performed at the hub of the aspiration catheter for 2-1/2 minutes, with proximal flow arrest in the right internal carotid artery. The retrieval device, the microcatheter and the aspiration catheter were retrieved and removed. Copious amounts of clot were captured into the [place], and also in the retrieval device. Following reversal of flow arrest, a control arteriogram performed through the balloon guide catheter demonstrated now improved flow through the proximal right internal carotid artery extra cranially and intracranially. There is now patency of the right anterior cerebral artery with a stump of the occluded right middle cerebral artery. A second pass was then made again using the above combination. The microcatheter was now accessed into the superior division of the right middle cerebral artery M2 M3 region over a 0.014 inch standard Synchro micro guidewire. Free aspiration of a blood was obtained at the hub of the microcatheter. A Tiger 21 retrieval device was then advanced to the distal end of the retrieval device. This was then deployed by retrieving the microcatheter.  The proximal portion of the retrieval device was within the proximal aspect of the occluded right middle cerebral artery. Free aspiration was then started through the aspiration catheter with the Penumbra aspiration device which was positioned in the distal portion of the occluded clot. The retrieval device was then opened and closed until there was kinking at the proximal portion. The microcatheter was then advanced to the proximal marker on the retrieval device. With constant aspiration continued, the Tiger 21 retrieval device was then removed as constant aspiration was applied at the hub of the aspiration catheter, and also with proximal flow arrest and aspiration at the hub of the balloon guide with a 60 mL syringe. Multiple fragments of clot were obtained within the [place], and also entangled in the retrieval device. With reversal of the proximal flow arrest, a control arteriogram performed through the balloon guide in the right internal carotid artery now demonstrated improved opacification of the right middle cerebral artery to where there was now opacification of the anterior temporal branch. Distal  branch remains occluded with no change in anterior cerebral artery. A third pass was now made using an 027 150 cm Marksman microcatheter which was advanced again using the combination of the 071 Zoom aspiration catheter over a 0.014 inch standard Synchro micro guidewire. This combination was advanced into the now the inferior division with the microcatheter advanced into the M2 region of the inferior division. The micro guidewire was removed. Good aspiration obtained from the hub of the 027 microcatheter. A gentle control arteriogram performed through this now demonstrated free flow into the distal vasculature. The aspiration catheter was engaged into the occluded distal right middle cerebral artery at the origin of the inferior division. Aspiration was now at the hub of the aspiration catheter embedded in  the right middle cerebral artery clot and also at the hub of the Marksman catheter for approximately 2 minutes, with proximal flow arrest in the right internal carotid artery and constant aspiration at its hub. The Marksman catheter was then gently retrieved while maintaining aspiration through the Zoom aspiration catheter. The Zoom aspiration catheter remained loft to aspiration. This was then retrieved and removed. Again clots were seen in the aspirate. Control arteriogram performed following reversal of flow arrest in the right internal carotid artery now demonstrated complete revascularization of the right MCA distribution, into the distal distribution achieving a TICI 2c revascularization. The right anterior cerebral artery remained widely patent. Measurements were now performed of the right internal carotid artery proximally, and the right common carotid artery distally at the proposed landing zones for the revascularization stent. An 021 Trevo ProVue microcatheter was advanced to the petrous segment of the right internal carotid artery over a 0.014 inch standard Synchro micro guidewire, and replaced with an 014 inch 300 cm Synchro exchange micro guidewire. Safe position of the tip of the exchange micro guidewire was verified. It was elected to proceed with placement of a 6/8 mm x 40 mm Xact stent across the diseased previously occluded right internal carotid artery proximally. This was retrogradely purged with heparinized saline infusion. Using the rapid exchange technique, the delivery system of the stent was then advanced and positioned covering distally, and also the proximal portion of the landing zones. Stent was deployed in the usual manner without any difficulty. The delivery system was removed. Good aspiration obtained from the hub of the balloon guide in the right common carotid artery. A control arteriogram performed through this demonstrated excellent positioning and apposition of the stent with  now free flow noted into the intracranial right ICA and the right middle and right anterior cerebral arteries being widely patent. Prior to this patient was loaded with 81 mg aspirin, and 180 mg of Brilinta via an orogastric tube. CT scan of the brain was then performed which demonstrated contrast blush of the right basal ganglia, and also to the 3 focal areas of hyperdensity suspicious for micro hemorrhages. 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 Three quarter bolus dose of Cangrelor was then given intravenously in order to salvage the occluding stent in the right internal carotid artery. The balloon guide was then removed following removal of the exchange micro guidewire. The diagnostic catheter was then positioned in the left common carotid artery. This demonstrated the left external carotid artery and the left internal carotid arteries to be widely patent. Patency of the left internal carotid to the  cranial skull base was verified. Also the left middle and the left anterior cerebral arteries were seen to opacify into the capillary and venous phases. Also almost simultaneously cross-filling via the anterior communicating artery of the right anterior and the right middle cerebral arteries is noted via the anterior communicating artery. No gross filling defects were seen in the middle cerebral artery distribution. 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 The diagnostic catheter was removed. The 8 French Pinnacle sheath was then exchanged for a short 8 [place] sheath which in turn was then removed with the successful application of the 7 [place] ExoSeal closure device with hemostasis in the right  groin. Distal pulses remained palpable in the feet bilaterally unchanged. A second CT scan of the brain demonstrated no significant change in the right basal ganglia with continued presence of at least 3 foci of hemorrhage. No mass effect or midline shift was seen. Patient was left intubated on account of patient's medical condition and inability to respond appropriately to protect the airway. Patient was then transferred to the neuro ICU for post recanalization management. IMPRESSION: Status post endovascular complete revascularization of the right middle cerebral artery and the right anterior cerebral artery with 1 pass with a Solitaire 4 mm x 40 mm X retrieval device and aspiration, and 1 pass with the Tiger 21 retrieval device with proximal aspiration, and with 1 pass with contact aspiration achieving a TICI 2c revascularization of the right middle cerebral artery distribution. 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 Attempt to rescue with 3/4 bolus dose of Cangrelor IV. PLAN: Follow-up in the clinic 4 to 6 weeks post discharge. Electronically Signed   By: Luanne Bras M.D.   On: 06/30/2020 13:20   IR CT Head [facility]  Result Date: 07/02/2020 INDICATION: New onset right gaze deviation, left hemiplegia, and left-sided neglect. EXAM: 1. EMERGENT LARGE VESSEL OCCLUSION THROMBOLYSIS anterior CIRCULATION) COMPARISON:  CT angiogram of the head and neck of June 29, 2020. MEDICATIONS: Ancef 2 g IV was administered within 1 hour of the procedure. ANESTHESIA/SEDATION: General anesthesia CONTRAST:  Isovue 300 approximately 165 mL FLUOROSCOPY TIME:  Fluoroscopy Time: 97 minutes 0 seconds (3622 mGy). COMPLICATIONS: None immediate. TECHNIQUE: Following a full explanation of the procedure along with the potential associated complications, an informed  witnessed consent was obtained from the spouse. The risks of intracranial hemorrhage of 10%, worsening neurological deficit, ventilator dependency, death and inability to revascularize were all reviewed in detail with the patient's spouse. The patient was then put under general anesthesia by the Department of Anesthesiology at [facility]. The right groin was prepped and draped in the usual sterile fashion. Thereafter using modified Seldinger technique, transfemoral access into the right common femoral artery was obtained without difficulty. Over a 0.035 inch guidewire an 8 French 25 Pinnacle sheath was inserted. Through this, and also over a 0.035 inch guidewire a 5 French 125 cm Berenstein select inside of the balloon guide catheter combinationwas advanced to the aortic arch region and selectively positioned in the distal right common carotid artery. Guidewire, and the select catheter removed. Good aspiration obtained from the hub of the balloon guide catheter in the right common carotid artery. Arteriogram was then performed centered extra cranially and intracranially. FINDINGS: The right common carotid arteriogram demonstrates the right external carotid artery  and its major branches to be widely patent. The right internal carotid artery at the bulb demonstrates a moderate sized plaque associated with complete occlusion just distal to the bulb. No evidence of distal angiographic string sign, or reconstitution of right internal carotid artery in the cavernous segment was noted. PROCEDURE: Through the balloon guide catheter, a combination of an 021 Trevo ProVue microcatheter over a 0.014 inch standard Synchro micro guidewire with a J configuration, access through the occluded right internal carotid artery was obtained with the micro guidewire leading followed by the microcatheter. The combination was advanced to the horizontal petrous segment where the micro guidewire was removed. Poor aspiration was  obtained from the microcatheter hub on account of the extensive clot in the entire right internal carotid artery intracranially and extra cranially. The microcatheter was then exchanged for a 014 inch Synchro standard 300 cm exchange micro guidewire with a J-tip configuration. A control arteriogram performed through the balloon guide demonstrated minimally improved caliber through the proximal right internal carotid artery. A 4 mm x 30 mm Viatrac 14 angioplasty balloon catheter which had been prepped with 50% contrast and 50% heparinized saline infusion and integrated with 75% contrast and 25% heparinized saline infusion was advanced over the exchange micro guidewire using the rapid exchange technique. The proximal and distal markers were then positioned in the described landing zones. A control inflation was then performed using micro inflation syringe device via micro tubing to 12 atmospheres. This was maintained for about 20 seconds and retrieved proximally. A control arteriogram performed through the balloon guide demonstrated improved caliber in the bulb but there continued be a high-grade stenosis in the proximal right internal carotid artery. This prompted a second angioplasty in the manner as described above again to 12 atmospheres for approximately 20 seconds. The balloon was then deflated and retrieved and removed. A control arteriogram performed through the balloon guide demonstrated significantly improved caliber and flow through the angioplastied segment with improved flow to the more distal right internal carotid artery. However, there continued to be extensive clot noted within the internal carotid artery at the cranial skull base and the supraclinoid right ICA. An 021 Trevo ProVue catheter inside of a 071 134 cm aspiration catheter, combination was advanced over the exchange micro guidewire to the cavernous segment of the right internal carotid artery. Exchange micro guidewire was replaced with a  regular 014 inch standard Synchro micro guidewire with a J configuration. This was then advanced using a torque device through the occluded right middle cerebral artery into one of the inferior division branches in the M2 M3 region followed by the microcatheter. The guidewire was removed. Good aspiration obtained from the hub of the microcatheter. A gentle control arteriogram performed through this demonstrated slow flow through the vessel itself. A 4 mm x 40 mm Solitaire X retrieval device was then advanced to the distal end of the microcatheter. The retriever was deployed in the usual manner. The Zoom aspiration catheter was advanced to the mid right M1 segment. Continuous aspiration was then performed at the hub of the aspiration catheter for 2-1/2 minutes, with proximal flow arrest in the right internal carotid artery. The retrieval device, the microcatheter and the aspiration catheter were retrieved and removed. Copious amounts of clot were captured into the [place], and also in the retrieval device. Following reversal of flow arrest, a control arteriogram performed through the balloon guide catheter demonstrated now improved flow through the proximal right internal carotid artery extra cranially and intracranially. There  is now patency of the right anterior cerebral artery with a stump of the occluded right middle cerebral artery. A second pass was then made again using the above combination. The microcatheter was now accessed into the superior division of the right middle cerebral artery M2 M3 region over a 0.014 inch standard Synchro micro guidewire. Free aspiration of a blood was obtained at the hub of the microcatheter. A Tiger 21 retrieval device was then advanced to the distal end of the retrieval device. This was then deployed by retrieving the microcatheter. The proximal portion of the retrieval device was within the proximal aspect of the occluded right middle cerebral artery. Free aspiration was  then started through the aspiration catheter with the Penumbra aspiration device which was positioned in the distal portion of the occluded clot. The retrieval device was then opened and closed until there was kinking at the proximal portion. The microcatheter was then advanced to the proximal marker on the retrieval device. With constant aspiration continued, the Tiger 21 retrieval device was then removed as constant aspiration was applied at the hub of the aspiration catheter, and also with proximal flow arrest and aspiration at the hub of the balloon guide with a 60 mL syringe. Multiple fragments of clot were obtained within the [place], and also entangled in the retrieval device. With reversal of the proximal flow arrest, a control arteriogram performed through the balloon guide in the right internal carotid artery now demonstrated improved opacification of the right middle cerebral artery to where there was now opacification of the anterior temporal branch. Distal branch remains occluded with no change in anterior cerebral artery. A third pass was now made using an 027 150 cm Marksman microcatheter which was advanced again using the combination of the 071 Zoom aspiration catheter over a 0.014 inch standard Synchro micro guidewire. This combination was advanced into the now the inferior division with the microcatheter advanced into the M2 region of the inferior division. The micro guidewire was removed. Good aspiration obtained from the hub of the 027 microcatheter. A gentle control arteriogram performed through this now demonstrated free flow into the distal vasculature. The aspiration catheter was engaged into the occluded distal right middle cerebral artery at the origin of the inferior division. Aspiration was now at the hub of the aspiration catheter embedded in the right middle cerebral artery clot and also at the hub of the Marksman catheter for approximately 2 minutes, with proximal flow arrest in  the right internal carotid artery and constant aspiration at its hub. The Marksman catheter was then gently retrieved while maintaining aspiration through the Zoom aspiration catheter. The Zoom aspiration catheter remained loft to aspiration. This was then retrieved and removed. Again clots were seen in the aspirate. Control arteriogram performed following reversal of flow arrest in the right internal carotid artery now demonstrated complete revascularization of the right MCA distribution, into the distal distribution achieving a TICI 2c revascularization. The right anterior cerebral artery remained widely patent. Measurements were now performed of the right internal carotid artery proximally, and the right common carotid artery distally at the proposed landing zones for the revascularization stent. An 021 Trevo ProVue microcatheter was advanced to the petrous segment of the right internal carotid artery over a 0.014 inch standard Synchro micro guidewire, and replaced with an 014 inch 300 cm Synchro exchange micro guidewire. Safe position of the tip of the exchange micro guidewire was verified. It was elected to proceed with placement of a 6/8 mm x 40  mm Xact stent across the diseased previously occluded right internal carotid artery proximally. This was retrogradely purged with heparinized saline infusion. Using the rapid exchange technique, the delivery system of the stent was then advanced and positioned covering distally, and also the proximal portion of the landing zones. Stent was deployed in the usual manner without any difficulty. The delivery system was removed. Good aspiration obtained from the hub of the balloon guide in the right common carotid artery. A control arteriogram performed through this demonstrated excellent positioning and apposition of the stent with now free flow noted into the intracranial right ICA and the right middle and right anterior cerebral arteries being widely patent. Prior to  this patient was loaded with 81 mg aspirin, and 180 mg of Brilinta via an orogastric tube. CT scan of the brain was then performed which demonstrated contrast blush of the right basal ganglia, and also to the 3 focal areas of hyperdensity suspicious for micro hemorrhages. 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 Three quarter bolus dose of Cangrelor was then given intravenously in order to salvage the occluding stent in the right internal carotid artery. The balloon guide was then removed following removal of the exchange micro guidewire. The diagnostic catheter was then positioned in the left common carotid artery. This demonstrated the left external carotid artery and the left internal carotid arteries to be widely patent. Patency of the left internal carotid to the cranial skull base was verified. Also the left middle and the left anterior cerebral arteries were seen to opacify into the capillary and venous phases. Also almost simultaneously cross-filling via the anterior communicating artery of the right anterior and the right middle cerebral arteries is noted via the anterior communicating artery. No gross filling defects were seen in the middle cerebral artery distribution. 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 The diagnostic catheter was removed. The 8 French Pinnacle sheath was then exchanged for a short 8 [place] sheath which in turn was then removed with the successful application of the 7 [place] ExoSeal closure device with hemostasis in the right groin. Distal pulses remained palpable in the feet bilaterally unchanged. A second CT scan of the brain demonstrated no significant change in the  right basal ganglia with continued presence of at least 3 foci of hemorrhage. No mass effect or midline shift was seen. Patient was left intubated on account of patient's medical condition and inability to respond appropriately to protect the airway. Patient was then transferred to the neuro ICU for post recanalization management. IMPRESSION: Status post endovascular complete revascularization of the right middle cerebral artery and the right anterior cerebral artery with 1 pass with a Solitaire 4 mm x 40 mm X retrieval device and aspiration, and 1 pass with the Tiger 21 retrieval device with proximal aspiration, and with 1 pass with contact aspiration achieving a TICI 2c revascularization of the right middle cerebral artery distribution. 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 Attempt to rescue with 3/4 bolus dose of Cangrelor IV. PLAN: Follow-up in the clinic 4 to 6 weeks post discharge. Electronically Signed   By: Luanne Bras M.D.   On: 06/30/2020 13:20  CT Code Stroke Cerebral Perfusion with contrast  Result Date: 06/29/2020 CLINICAL DATA:  Hyperdense vessel and stroke-like symptoms EXAM: CT ANGIOGRAPHY HEAD AND NECK CT PERFUSION BRAIN TECHNIQUE: Multidetector CT imaging of the head and neck was performed using the standard protocol during bolus administration of intravenous contrast. Multiplanar CT image reconstructions and MIPs were obtained to evaluate the vascular anatomy. Carotid stenosis measurements (when applicable) are obtained utilizing NASCET criteria, using the distal internal carotid diameter as the denominator. Multiphase CT imaging of the brain was performed following IV bolus contrast injection. Subsequent parametric perfusion maps were calculated using RAPID software. CONTRAST:  Dose is not yet known COMPARISON:   None. FINDINGS: CTA NECK FINDINGS Aortic arch: Mild atheromatous changes. Three vessel branching. No acute finding. Right carotid system: Atherosclerotic plaque about the bifurcation with occluded ICA bulb. No flow seen within the ICA in the neck. Left carotid system: Atheromatous plaque at the bifurcation and bulb. No stenosis or ulceration. Negative for beading. Vertebral arteries: Low-density plaque at the left subclavian origin. No flow limiting subclavian stenosis. Plaque at the left vertebral origin without stenosis, beading, or dissection. Skeleton: Focal advanced C5-6 disc degeneration with ridging causing biforaminal impingement. Ordinary facet osteoarthritis. Other neck: No acute or aggressive finding. Right-sided chronic sinusitis with pattern middle meatus obstruction. Upper chest: Granulomatous nodal calcifications. There is also a calcified granuloma appearance in the left upper lobe. Review of the MIP images confirms the above findings CTA HEAD FINDINGS Anterior circulation: No flow seen in the right carotid or MCA branches. There is distal M2 M3 branch reconstitution. Calcified plaque along the left carotid siphon. No MCA branch occlusion, aneurysm, or beading. Posterior circulation: The vertebral and basilar arteries are smooth and widely patent. High-grade atheromatous narrowings of the bilateral PCA, P3 segment on the right and upper first order branch on the left. Venous sinuses: Patent as permitted by contrast timing Anatomic variants: None significant Review of the MIP images confirms the above findings CT Brain Perfusion Findings: ASPECTS: 9 CBF (<30%) Volume: 49mL Perfusion (Tmax>6.0s) volume: 181mL Mismatch Volume: 15mL Infarction Location:Right basal ganglia and lesser extensive lateral frontal cortex infarct. Critical Value/emergent results were called by telephone at the time of interpretation on 06/29/2020 at 9:37 am to provider ERIC [facility] , who verbally acknowledged these results.  IMPRESSION: 1. Emergent large vessel occlusion with no flow seen in the right internal carotid or proximal MCA. There is a 36 cc core infarct and 142 cc of penumbra by CT perfusion. 2. Atherosclerosis without flow limiting stenosis of the other major vessels. 3. Bilateral high-grade atheromatous PCA narrowings, more proximal on the right. 4. 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 Electronically Signed   By: Monte Fantasia M.D.   On: 06/29/2020 09:53   Portable Chest xray  Result Date: 06/30/2020 CLINICAL DATA:  Respiratory failure.  Stroke. EXAM: PORTABLE CHEST 1 VIEW COMPARISON:  06/29/2020 FINDINGS: 0538 hours. Endotracheal tube tip is 7.4 cm above the base of the carina. The lungs are clear without focal pneumonia, edema, pneumothorax or pleural effusion. The cardiopericardial silhouette is within normal limits for size. The visualized bony structures of the thorax show no acute abnormality. Telemetry leads overlie the chest. IMPRESSION: No acute cardiopulmonary findings. Electronically Signed   By: Misty Stanley M.D.   On: 06/30/2020 07:30   DG CHEST PORT 1 VIEW  Result Date: 06/29/2020 CLINICAL DATA:  Code stroke with coiling intubated EXAM: PORTABLE CHEST 1 VIEW COMPARISON:  None. FINDINGS: Endotracheal tube tip is about 4.2 cm  superior to the carina. Esophageal tube tip is below the diaphragm but incompletely visualized. No focal opacity or pleural effusion. Normal cardiac size. No pneumothorax. IMPRESSION: Endotracheal tube tip about 4.2 cm superior to the carina. Lungs grossly clear Electronically Signed   By: Donavan Foil M.D.   On: 06/29/2020 16:00   DG Swallowing Func-Speech Pathology  Result Date: 07/01/2020 Objective Swallowing Evaluation: Type of Study: Bedside Swallow Evaluation  Patient Details Name: IMAN OROURKE MRN: [id number] Date of Birth: [birth year] Today's Date: 07/01/2020 Time: SLP Start Time (ACUTE ONLY): 1252 -SLP Stop Time (ACUTE ONLY): 1310  SLP Time Calculation (min) (ACUTE ONLY): 18 min Past Medical History: No past medical history on file. Past Surgical History: Past Surgical History: Procedure Laterality Date . RADIOLOGY WITH ANESTHESIA N/A 06/29/2020  Procedure: IR WITH ANESTHESIA;  Surgeon: Radiologist, Medication, MD;  Location: [place];  Service: Radiology;  Laterality: N/A; HPI: Patient is a 69 y/o male who presents with left sided weakness, right gaze and slurred speech. NIH:17. Head CT- dense Right MCA infarct. CTA- Right ICA and MCA occlusions. s/p right MCA thrombectomy and right ICA stent placement 06/29/20 with post procedure hemmorhage. ETT 10/4-10/5. No PMH.  Subjective: alert, cooperative Assessment / Plan / Recommendation CHL IP CLINICAL IMPRESSIONS 07/01/2020 Clinical Impression Pt has an oral more than pharyngeal dysphagia, with oral preparation and transit impacted by reduced strength and suspect sensation as well. He has intermittent trouble getting liquids via cup or straw, with reduced seal and sucking. There is anterior spillage with thin liquids; L buccal pocketing with more solid consistencies and slower posterior transit. Mild premature spillage occurs with thin liquids. He has relatively improved pharyngeal function with only mildly reduced base of tongue retraction that results in trace-mild vallecular residue with purees and mild residue with solids. Recommend starting Dys 1 diet and thin liquids. Will f/u for advancement of solids clinically.  SLP Visit Diagnosis Dysphagia, oropharyngeal phase (R13.12) Attention and concentration deficit following -- Frontal lobe and executive function deficit following -- Impact on safety and function Mild aspiration risk   CHL IP TREATMENT RECOMMENDATION 07/01/2020 Treatment Recommendations Therapy as outlined in treatment plan below   Prognosis 07/01/2020 Prognosis for Safe Diet Advancement Good Barriers to Reach Goals -- Barriers/Prognosis Comment -- CHL IP DIET RECOMMENDATION 07/01/2020 SLP  Diet Recommendations Dysphagia 1 (Puree) solids;Thin liquid Liquid Administration via Straw;Cup Medication Administration Crushed with puree Compensations Minimize environmental distractions;Slow rate;Small sips/bites;Lingual sweep for clearance of pocketing Postural Changes Seated upright at 90 degrees   CHL IP OTHER RECOMMENDATIONS 07/01/2020 Recommended Consults -- Oral Care Recommendations Oral care before and after PO Other Recommendations Have oral suction available   CHL IP FOLLOW UP RECOMMENDATIONS 07/01/2020 Follow up Recommendations Inpatient Rehab   CHL IP FREQUENCY AND DURATION 07/01/2020 Speech Therapy Frequency (ACUTE ONLY) min 2x/week Treatment Duration 2 weeks      CHL IP ORAL PHASE 07/01/2020 Oral Phase Impaired Oral - Pudding Teaspoon -- Oral - Pudding Cup -- Oral - Honey Teaspoon -- Oral - Honey Cup -- Oral - Nectar Teaspoon -- Oral - Nectar Cup -- Oral - Nectar Straw -- Oral - Thin Teaspoon -- Oral - Thin Cup Left anterior bolus loss;Weak lingual manipulation;Decreased bolus cohesion;Premature spillage Oral - Thin Straw Weak lingual manipulation;Decreased bolus cohesion;Premature spillage Oral - Puree Weak lingual manipulation;Decreased bolus cohesion;Premature spillage Oral - Mech Soft Weak lingual manipulation;Decreased bolus cohesion;Premature spillage;Left pocketing in lateral sulci Oral - Regular -- Oral - Multi-Consistency -- Oral - Pill -- Oral Phase - Comment --  CHL IP PHARYNGEAL PHASE 07/01/2020 Pharyngeal Phase Impaired Pharyngeal- Pudding Teaspoon -- Pharyngeal -- Pharyngeal- Pudding Cup -- Pharyngeal -- Pharyngeal- Honey Teaspoon -- Pharyngeal -- Pharyngeal- Honey Cup -- Pharyngeal -- Pharyngeal- Nectar Teaspoon -- Pharyngeal -- Pharyngeal- Nectar Cup -- Pharyngeal -- Pharyngeal- Nectar Straw -- Pharyngeal -- Pharyngeal- Thin Teaspoon -- Pharyngeal -- Pharyngeal- Thin Cup Delayed swallow initiation-pyriform sinuses;Reduced tongue base retraction Pharyngeal -- Pharyngeal- Thin Straw  Delayed swallow initiation-pyriform sinuses;Reduced tongue base retraction Pharyngeal -- Pharyngeal- Puree Reduced tongue base retraction;Pharyngeal residue - valleculae Pharyngeal -- Pharyngeal- Mechanical Soft Reduced tongue base retraction;Pharyngeal residue - valleculae Pharyngeal -- Pharyngeal- Regular -- Pharyngeal -- Pharyngeal- Multi-consistency -- Pharyngeal -- Pharyngeal- Pill -- Pharyngeal -- Pharyngeal Comment --  CHL IP CERVICAL ESOPHAGEAL PHASE 07/01/2020 Cervical Esophageal Phase WFL Pudding Teaspoon -- Pudding Cup -- Honey Teaspoon -- Honey Cup -- Nectar Teaspoon -- Nectar Cup -- Nectar Straw -- Thin Teaspoon -- Thin Cup -- Thin Straw -- Puree -- Mechanical Soft -- Regular -- Multi-consistency -- Pill -- Cervical Esophageal Comment -- Tyler Richards., M.A. [facility] Pager [phone] Office [phone] 07/01/2020, 2:21 PM              EEG adult  Result Date: 07/02/2020 Lora Havens, MD     07/02/2020  6:23 PM Patient Name: Tyler Richards MRN: [id number] Epilepsy Attending: Lora Havens Referring Physician/Provider: Dr Rosalin Hawking Date: 07/02/2020 Duration: 24.26 mins Patient history: 70 y.o. male with history of gout admitted for left-sided weakness, right gaze, left facial droop with fall. MRI showed acute infarct at the right basal ganglia with nonprogressive hemorrhage as well as less extensive patchy acute cortical infarct in the right MCA territory EEG to evaluate for seizure. Level of alertness: Awake, asleep AEDs during EEG study: None Technical aspects: This EEG study was done with scalp electrodes positioned according to the 10-20 International system of electrode placement. Electrical activity was acquired at a sampling rate of 500Hz  and reviewed with a high frequency filter of 70Hz  and a low frequency filter of 1Hz . EEG data were recorded continuously and digitally stored. Description: The posterior dominant rhythm consists of 8 Hz activity of moderate  voltage (25-35 uV) seen predominantly in posterior head regions, asymmetric ( R<L) and reactive to eye opening and eye closing. Sleep was characterized by vertex waves, sleep spindles (12 to 14 Hz), maximal frontocentral region.  EEG showed continuous 3 to 5 Hz theta-delta slowing in right hemisphere.  Hyperventilation and photic stimulation were not performed.   ABNORMALITY -Continuous slow, right hemisphere - Background asymmetry, right<left IMPRESSION: This study is suggestive of cortical dysfunction in right hemisphere consistent with underlying stroke. No seizures or epileptiform discharges were seen throughout the recording. Lora Havens   ECHOCARDIOGRAM COMPLETE  Result Date: 06/30/2020    ECHOCARDIOGRAM REPORT   Patient Name:   URIAS SHEEK Date of Exam: 06/30/2020 Medical Rec #:  [id number]     Height:       74.0 in Accession #:    [id number]    Weight:       187.6 lb Date of Birth:  1950/11/28     BSA:          2.115 m Patient Age:    69 years      BP:           162/65 mmHg Patient Gender: M             HR:  52 bpm. Exam Location:  Inpatient Procedure: 2D Echo, Cardiac Doppler, Color Doppler and Intracardiac            Opacification Agent Indications:    CVA  History:        Patient has no prior history of Echocardiogram examinations.                 Stroke and COPD, Signs/Symptoms:Resp. failure; Risk                 Factors:Hypertension.  Sonographer:    Dustin Flock Referring Phys: [phone] ERIC LINDZEN  Sonographer Comments: Technically difficult study due to poor echo windows and echo performed with patient supine and on artificial respirator. Image acquisition challenging due to COPD and Image acquisition challenging due to respiratory motion. IMPRESSIONS  1. Left ventricular ejection fraction, by estimation, is 50 to 55%. The left ventricle has low normal function. The left ventricle has no regional wall motion abnormalities. Left ventricular diastolic parameters are consistent with  Grade I diastolic dysfunction (impaired relaxation).  2. Right ventricular systolic function is normal. The right ventricular size is normal. There is mildly elevated pulmonary artery systolic pressure.  3. Left atrial size was mildly dilated.  4. The mitral valve is normal in structure. No evidence of mitral valve regurgitation. No evidence of mitral stenosis.  5. The aortic valve is normal in structure. Aortic valve regurgitation is not visualized. No aortic stenosis is present.  6. The inferior vena cava is dilated in size with <50% respiratory variability, suggesting right atrial pressure of 15 mmHg. FINDINGS  Left Ventricle: Left ventricular ejection fraction, by estimation, is 50 to 55%. The left ventricle has low normal function. The left ventricle has no regional wall motion abnormalities. Definity contrast agent was given IV to delineate the left ventricular endocardial borders. The left ventricular internal cavity size was normal in size. There is no left ventricular hypertrophy. Left ventricular diastolic parameters are consistent with Grade I diastolic dysfunction (impaired relaxation). Indeterminate filling pressures. Right Ventricle: The right ventricular size is normal. No increase in right ventricular wall thickness. Right ventricular systolic function is normal. There is mildly elevated pulmonary artery systolic pressure. The tricuspid regurgitant velocity is 2.43  m/s, and with an assumed right atrial pressure of 15 mmHg, the estimated right ventricular systolic pressure is 41.9 mmHg. Left Atrium: Left atrial size was mildly dilated. Right Atrium: Right atrial size was normal in size. Pericardium: There is no evidence of pericardial effusion. Mitral Valve: The mitral valve is normal in structure. No evidence of mitral valve regurgitation. No evidence of mitral valve stenosis. Tricuspid Valve: The tricuspid valve is normal in structure. Tricuspid valve regurgitation is trivial. No evidence of  tricuspid stenosis. Aortic Valve: The aortic valve is normal in structure. Aortic valve regurgitation is not visualized. No aortic stenosis is present. Pulmonic Valve: The pulmonic valve was normal in structure. Pulmonic valve regurgitation is not visualized. No evidence of pulmonic stenosis. Aorta: The aortic root is normal in size and structure. Venous: The inferior vena cava is dilated in size with less than 50% respiratory variability, suggesting right atrial pressure of 15 mmHg. IAS/Shunts: No atrial level shunt detected by color flow Doppler.  LEFT VENTRICLE PLAX 2D LVIDd:         5.30 cm  Diastology LVIDs:         3.80 cm  LV e' medial:    6.85 cm/s LV PW:         1.30 cm  LV E/e' medial:  10.2 LV IVS:        1.00 cm  LV e' lateral:   12.10 cm/s LVOT diam:     2.10 cm  LV E/e' lateral: 5.8 LV SV:         106 LV SV Index:   50 LVOT Area:     3.46 cm  RIGHT VENTRICLE RV Basal diam:  3.80 cm RV S prime:     7.07 cm/s TAPSE (M-mode): 3.3 cm LEFT ATRIUM           Index       RIGHT ATRIUM           Index LA diam:      4.30 cm 2.03 cm/m  RA Area:     21.00 cm LA Vol (A4C): 45.5 ml 21.51 ml/m RA Volume:   67.30 ml  31.82 ml/m  AORTIC VALVE LVOT Vmax:   150.00 cm/s LVOT Vmean:  90.400 cm/s LVOT VTI:    0.305 m  AORTA Ao Root diam: 3.50 cm MITRAL VALVE               TRICUSPID VALVE MV Area (PHT): 2.80 cm    TR Peak grad:   23.6 mmHg MV Decel Time: 271 msec    TR Vmax:        243.00 cm/s MV E velocity: 69.80 cm/s MV A velocity: 68.60 cm/s  SHUNTS MV E/A ratio:  1.02        Systemic VTI:  0.30 m                            Systemic Diam: 2.10 cm Skeet Latch MD Electronically signed by Skeet Latch MD Signature Date/Time: 06/30/2020/11:25:18 AM    Final    IR PERCUTANEOUS ART THROMBECTOMY/INFUSION INTRACRANIAL INC DIAG ANGIO  Result Date: 07/02/2020 INDICATION: New onset right gaze deviation, left hemiplegia, and left-sided neglect. EXAM: 1. EMERGENT LARGE VESSEL OCCLUSION THROMBOLYSIS anterior  CIRCULATION) COMPARISON:  CT angiogram of the head and neck of June 29, 2020. MEDICATIONS: Ancef 2 g IV was administered within 1 hour of the procedure. ANESTHESIA/SEDATION: General anesthesia CONTRAST:  Isovue 300 approximately 165 mL FLUOROSCOPY TIME:  Fluoroscopy Time: 97 minutes 0 seconds (3622 mGy). COMPLICATIONS: None immediate. TECHNIQUE: Following a full explanation of the procedure along with the potential associated complications, an informed witnessed consent was obtained from the spouse. The risks of intracranial hemorrhage of 10%, worsening neurological deficit, ventilator dependency, death and inability to revascularize were all reviewed in detail with the patient's spouse. The patient was then put under general anesthesia by the Department of Anesthesiology at [facility]. The right groin was prepped and draped in the usual sterile fashion. Thereafter using modified Seldinger technique, transfemoral access into the right common femoral artery was obtained without difficulty. Over a 0.035 inch guidewire an 8 French 25 Pinnacle sheath was inserted. Through this, and also over a 0.035 inch guidewire a 5 French 125 cm Berenstein select inside of the balloon guide catheter combinationwas advanced to the aortic arch region and selectively positioned in the distal right common carotid artery. Guidewire, and the select catheter removed. Good aspiration obtained from the hub of the balloon guide catheter in the right common carotid artery. Arteriogram was then performed centered extra cranially and intracranially. FINDINGS: The right common carotid arteriogram demonstrates the right external carotid artery and its major branches to be widely patent. The right internal carotid artery at  the bulb demonstrates a moderate sized plaque associated with complete occlusion just distal to the bulb. No evidence of distal angiographic string sign, or reconstitution of right internal carotid artery in the  cavernous segment was noted. PROCEDURE: Through the balloon guide catheter, a combination of an 021 Trevo ProVue microcatheter over a 0.014 inch standard Synchro micro guidewire with a J configuration, access through the occluded right internal carotid artery was obtained with the micro guidewire leading followed by the microcatheter. The combination was advanced to the horizontal petrous segment where the micro guidewire was removed. Poor aspiration was obtained from the microcatheter hub on account of the extensive clot in the entire right internal carotid artery intracranially and extra cranially. The microcatheter was then exchanged for a 014 inch Synchro standard 300 cm exchange micro guidewire with a J-tip configuration. A control arteriogram performed through the balloon guide demonstrated minimally improved caliber through the proximal right internal carotid artery. A 4 mm x 30 mm Viatrac 14 angioplasty balloon catheter which had been prepped with 50% contrast and 50% heparinized saline infusion and integrated with 75% contrast and 25% heparinized saline infusion was advanced over the exchange micro guidewire using the rapid exchange technique. The proximal and distal markers were then positioned in the described landing zones. A control inflation was then performed using micro inflation syringe device via micro tubing to 12 atmospheres. This was maintained for about 20 seconds and retrieved proximally. A control arteriogram performed through the balloon guide demonstrated improved caliber in the bulb but there continued be a high-grade stenosis in the proximal right internal carotid artery. This prompted a second angioplasty in the manner as described above again to 12 atmospheres for approximately 20 seconds. The balloon was then deflated and retrieved and removed. A control arteriogram performed through the balloon guide demonstrated significantly improved caliber and flow through the angioplastied  segment with improved flow to the more distal right internal carotid artery. However, there continued to be extensive clot noted within the internal carotid artery at the cranial skull base and the supraclinoid right ICA. An 021 Trevo ProVue catheter inside of a 071 134 cm aspiration catheter, combination was advanced over the exchange micro guidewire to the cavernous segment of the right internal carotid artery. Exchange micro guidewire was replaced with a regular 014 inch standard Synchro micro guidewire with a J configuration. This was then advanced using a torque device through the occluded right middle cerebral artery into one of the inferior division branches in the M2 M3 region followed by the microcatheter. The guidewire was removed. Good aspiration obtained from the hub of the microcatheter. A gentle control arteriogram performed through this demonstrated slow flow through the vessel itself. A 4 mm x 40 mm Solitaire X retrieval device was then advanced to the distal end of the microcatheter. The retriever was deployed in the usual manner. The Zoom aspiration catheter was advanced to the mid right M1 segment. Continuous aspiration was then performed at the hub of the aspiration catheter for 2-1/2 minutes, with proximal flow arrest in the right internal carotid artery. The retrieval device, the microcatheter and the aspiration catheter were retrieved and removed. Copious amounts of clot were captured into the [place], and also in the retrieval device. Following reversal of flow arrest, a control arteriogram performed through the balloon guide catheter demonstrated now improved flow through the proximal right internal carotid artery extra cranially and intracranially. There is now patency of the right anterior cerebral artery with a stump of the  occluded right middle cerebral artery. A second pass was then made again using the above combination. The microcatheter was now accessed into the superior division  of the right middle cerebral artery M2 M3 region over a 0.014 inch standard Synchro micro guidewire. Free aspiration of a blood was obtained at the hub of the microcatheter. A Tiger 21 retrieval device was then advanced to the distal end of the retrieval device. This was then deployed by retrieving the microcatheter. The proximal portion of the retrieval device was within the proximal aspect of the occluded right middle cerebral artery. Free aspiration was then started through the aspiration catheter with the Penumbra aspiration device which was positioned in the distal portion of the occluded clot. The retrieval device was then opened and closed until there was kinking at the proximal portion. The microcatheter was then advanced to the proximal marker on the retrieval device. With constant aspiration continued, the Tiger 21 retrieval device was then removed as constant aspiration was applied at the hub of the aspiration catheter, and also with proximal flow arrest and aspiration at the hub of the balloon guide with a 60 mL syringe. Multiple fragments of clot were obtained within the [place], and also entangled in the retrieval device. With reversal of the proximal flow arrest, a control arteriogram performed through the balloon guide in the right internal carotid artery now demonstrated improved opacification of the right middle cerebral artery to where there was now opacification of the anterior temporal branch. Distal branch remains occluded with no change in anterior cerebral artery. A third pass was now made using an 027 150 cm Marksman microcatheter which was advanced again using the combination of the 071 Zoom aspiration catheter over a 0.014 inch standard Synchro micro guidewire. This combination was advanced into the now the inferior division with the microcatheter advanced into the M2 region of the inferior division. The micro guidewire was removed. Good aspiration obtained from the hub of the 027  microcatheter. A gentle control arteriogram performed through this now demonstrated free flow into the distal vasculature. The aspiration catheter was engaged into the occluded distal right middle cerebral artery at the origin of the inferior division. Aspiration was now at the hub of the aspiration catheter embedded in the right middle cerebral artery clot and also at the hub of the Marksman catheter for approximately 2 minutes, with proximal flow arrest in the right internal carotid artery and constant aspiration at its hub. The Marksman catheter was then gently retrieved while maintaining aspiration through the Zoom aspiration catheter. The Zoom aspiration catheter remained loft to aspiration. This was then retrieved and removed. Again clots were seen in the aspirate. Control arteriogram performed following reversal of flow arrest in the right internal carotid artery now demonstrated complete revascularization of the right MCA distribution, into the distal distribution achieving a TICI 2c revascularization. The right anterior cerebral artery remained widely patent. Measurements were now performed of the right internal carotid artery proximally, and the right common carotid artery distally at the proposed landing zones for the revascularization stent. An 021 Trevo ProVue microcatheter was advanced to the petrous segment of the right internal carotid artery over a 0.014 inch standard Synchro micro guidewire, and replaced with an 014 inch 300 cm Synchro exchange micro guidewire. Safe position of the tip of the exchange micro guidewire was verified. It was elected to proceed with placement of a 6/8 mm x 40 mm Xact stent across the diseased previously occluded right internal carotid artery proximally. This  was retrogradely purged with heparinized saline infusion. Using the rapid exchange technique, the delivery system of the stent was then advanced and positioned covering distally, and also the proximal portion of the  landing zones. Stent was deployed in the usual manner without any difficulty. The delivery system was removed. Good aspiration obtained from the hub of the balloon guide in the right common carotid artery. A control arteriogram performed through this demonstrated excellent positioning and apposition of the stent with now free flow noted into the intracranial right ICA and the right middle and right anterior cerebral arteries being widely patent. Prior to this patient was loaded with 81 mg aspirin, and 180 mg of Brilinta via an orogastric tube. CT scan of the brain was then performed which demonstrated contrast blush of the right basal ganglia, and also to the 3 focal areas of hyperdensity suspicious for micro hemorrhages. 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 Three quarter bolus dose of Cangrelor was then given intravenously in order to salvage the occluding stent in the right internal carotid artery. The balloon guide was then removed following removal of the exchange micro guidewire. The diagnostic catheter was then positioned in the left common carotid artery. This demonstrated the left external carotid artery and the left internal carotid arteries to be widely patent. Patency of the left internal carotid to the cranial skull base was verified. Also the left middle and the left anterior cerebral arteries were seen to opacify into the capillary and venous phases. Also almost simultaneously cross-filling via the anterior communicating artery of the right anterior and the right middle cerebral arteries is noted via the anterior communicating artery. No gross filling defects were seen in the middle cerebral artery distribution. Another diagnostic arteriogram through the right common carotid artery demonstrated now complete  occlusion of the stented segment of the right internal carotid artery. The diagnostic catheter was removed. The 8 French Pinnacle sheath was then exchanged for a short 8 [place] sheath which in turn was then removed with the successful application of the 7 [place] ExoSeal closure device with hemostasis in the right groin. Distal pulses remained palpable in the feet bilaterally unchanged. A second CT scan of the brain demonstrated no significant change in the right basal ganglia with continued presence of at least 3 foci of hemorrhage. No mass effect or midline shift was seen. Patient was left intubated on account of patient's medical condition and inability to respond appropriately to protect the airway. Patient was then transferred to the neuro ICU for post recanalization management. IMPRESSION: Status post endovascular complete revascularization of the right middle cerebral artery and the right anterior cerebral artery with 1 pass with a Solitaire 4 mm x 40 mm X retrieval device and aspiration, and 1 pass with the Tiger 21 retrieval device with proximal aspiration, and with 1 pass with contact aspiration achieving a TICI 2c revascularization of the right middle cerebral artery distribution. 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 Attempt to rescue with 3/4 bolus dose of Cangrelor IV. PLAN: Follow-up in the clinic 4 to 6 weeks post discharge. Electronically Signed   By: Luanne Bras M.D.   On: 06/30/2020 13:20   CT HEAD CODE STROKE WO CONTRAST  Result Date: 06/29/2020 CLINICAL DATA:  Code stroke.  Slurred speech and left facial droop EXAM: CT HEAD WITHOUT CONTRAST TECHNIQUE: Contiguous axial images were obtained from the base of the skull through the vertex without intravenous contrast. COMPARISON:  None. FINDINGS: Brain: Small remote  appearing cortically based infarcts in the superior right frontal lobe. No hemorrhage, hydrocephalus, or masslike finding. Vascular: Hyperdense distal right ICA to MCA branches. Atherosclerotic calcification. Skull: Negative Sinuses/Orbits: Right maxillary, ethmoid, and frontal sinus opacification with sclerotic wall thickening at the maxillary sinus. Other: Critical Value/emergent results were called by telephone at the time of interpretation on 06/29/2020 at 9:23 am to provider Lindzen , who verbally acknowledged these results. ASPECTS [facility] Stroke Program Early CT Score) - Ganglionic level infarction (caudate, lentiform nuclei, internal capsule, insula, M1-M3 cortex): Posterior putamen appears blunted compared to the left. Equivocal for small insular cortex infarct. - Supraganglionic infarction (M4-M6 cortex): 3, when accounting for chronic infarct Total score (0-10 with 10 being normal): 9, when accounting for chronic infarct IMPRESSION: 1. Hyperdense distal right ICA and proximal MCA. 2. Blunted appearance of the posterior right putamen. Chronic right high frontal cortex infarcts. ASPECTS is 9 when excluding the chronic changes. Electronically Signed   By: Monte Fantasia M.D.   On: 06/29/2020 09:25   VAS US CAROTID  Result Date: 07/02/2020 Carotid Arterial Duplex Study Indications:   CVA and Right stent. Other Factors: Rt ica stent- 06/29/20. Performing Technologist: [name] RVS  Examination Guidelines: A complete evaluation includes B-mode imaging, spectral Doppler, color Doppler, and power Doppler as needed of all accessible portions of each vessel. Bilateral testing is considered an integral part of a complete examination. Limited examinations for reoccurring indications may be performed as noted.  Right Carotid Findings: +----------+--------+--------+--------+------------------+--------+           PSV cm/sEDV cm/sStenosisPlaque DescriptionComments  +----------+--------+--------+--------+------------------+--------+ CCA Prox  101                     heterogenous               +----------+--------+--------+--------+------------------+--------+ CCA Distal57                      heterogenous               +----------+--------+--------+--------+------------------+--------+ ECA       103     6                                          +----------+--------+--------+--------+------------------+--------+ +----------+--------+-------+--------+-------------------+           PSV cm/sEDV cmsDescribeArm Pressure (mmHG) +----------+--------+-------+--------+-------------------+ Subclavian160                                        +----------+--------+-------+--------+-------------------+ +---------+--------+--+--------+--+---------+ VertebralPSV cm/s63EDV cm/s12Antegrade +---------+--------+--+--------+--+---------+  Right Stent(s): +---------------+--+-+--------+++ Prox to Stent  274         +---------------+--+-+--------+++ Proximal Stent 49          +---------------+--+-+--------+++ Mid Stent         occluded +---------------+--+-+--------+++ Distal Stent      occluded +---------------+--+-+--------+++ Distal to Stent   occluded +---------------+--+-+--------+++   Left Carotid Findings: +----------+--------+--------+--------+------------------+--------+           PSV cm/sEDV cm/sStenosisPlaque DescriptionComments +----------+--------+--------+--------+------------------+--------+ CCA Prox  133     18  heterogenous               +----------+--------+--------+--------+------------------+--------+ CCA Distal95      17              heterogenous               +----------+--------+--------+--------+------------------+--------+ ICA Prox  97      22      1-39%   heterogenous               +----------+--------+--------+--------+------------------+--------+ ICA  Distal108     36                                         +----------+--------+--------+--------+------------------+--------+ ECA       120     10                                         +----------+--------+--------+--------+------------------+--------+ +----------+--------+--------+--------+-------------------+           PSV cm/sEDV cm/sDescribeArm Pressure (mmHG) +----------+--------+--------+--------+-------------------+ [id number]                                         +----------+--------+--------+--------+-------------------+ +---------+--------+--+--------+--+---------+ VertebralPSV cm/s48EDV cm/s13Antegrade +---------+--------+--+--------+--+---------+   Summary: Right Carotid: ICA stent appears occluded. Left Carotid: Velocities in the left ICA are consistent with a 1-39% stenosis. Vertebrals: Bilateral vertebral arteries demonstrate antegrade flow. *See table(s) above for measurements and observations.  Electronically signed by Antony Contras MD on 07/02/2020 at 8:34:52 AM.    Final    VAS [place] LOWER EXTREMITY VENOUS (DVT)  Result Date: 07/01/2020  Lower Venous DVTStudy Indications: Stroke.  Comparison Study: no prior Performing Technologist: [name] RVS  Examination Guidelines: A complete evaluation includes B-mode imaging, spectral Doppler, color Doppler, and power Doppler as needed of all accessible portions of each vessel. Bilateral testing is considered an integral part of a complete examination. Limited examinations for reoccurring indications may be performed as noted. The reflux portion of the exam is performed with the patient in reverse Trendelenburg.  +---------+---------------+---------+-----------+----------+--------------+ RIGHT    CompressibilityPhasicitySpontaneityPropertiesThrombus Aging +---------+---------------+---------+-----------+----------+--------------+ CFV      Full           Yes      Yes                                  +---------+---------------+---------+-----------+----------+--------------+ SFJ      Full                                                        +---------+---------------+---------+-----------+----------+--------------+ FV Prox  Full                                                        +---------+---------------+---------+-----------+----------+--------------+ FV Mid   Full                                                        +---------+---------------+---------+-----------+----------+--------------+  FV DistalFull                                                        +---------+---------------+---------+-----------+----------+--------------+ PFV      Full                                                        +---------+---------------+---------+-----------+----------+--------------+ POP      Full           Yes      Yes                                 +---------+---------------+---------+-----------+----------+--------------+ PTV      Full                                                        +---------+---------------+---------+-----------+----------+--------------+ PERO     Full                                                        +---------+---------------+---------+-----------+----------+--------------+   +---------+---------------+---------+-----------+----------+--------------+ LEFT     CompressibilityPhasicitySpontaneityPropertiesThrombus Aging +---------+---------------+---------+-----------+----------+--------------+ CFV      Full           Yes      Yes                                 +---------+---------------+---------+-----------+----------+--------------+ SFJ      Full                                                        +---------+---------------+---------+-----------+----------+--------------+ FV Prox  Full                                                         +---------+---------------+---------+-----------+----------+--------------+ FV Mid   Full                                                        +---------+---------------+---------+-----------+----------+--------------+ FV DistalFull                                                        +---------+---------------+---------+-----------+----------+--------------+   PFV      Full                                                        +---------+---------------+---------+-----------+----------+--------------+ POP      Full           Yes      Yes                                 +---------+---------------+---------+-----------+----------+--------------+ PTV      Full                                                        +---------+---------------+---------+-----------+----------+--------------+     Summary: BILATERAL: - No evidence of deep vein thrombosis seen in the lower extremities, bilaterally. - No evidence of superficial venous thrombosis in the lower extremities, bilaterally. -   *See table(s) above for measurements and observations. Electronically signed by Harold Barban MD on 07/01/2020 at 9:06:00 PM.    Final    IR ANGIO INTRA EXTRACRAN SEL COM CAROTID INNOMINATE UNI L MOD SED  Result Date: 07/02/2020 INDICATION: New onset right gaze deviation, left hemiplegia, and left-sided neglect. EXAM: 1. EMERGENT LARGE VESSEL OCCLUSION THROMBOLYSIS anterior CIRCULATION) COMPARISON:  CT angiogram of the head and neck of June 29, 2020. MEDICATIONS: Ancef 2 g IV was administered within 1 hour of the procedure. ANESTHESIA/SEDATION: General anesthesia CONTRAST:  Isovue 300 approximately 165 mL FLUOROSCOPY TIME:  Fluoroscopy Time: 97 minutes 0 seconds (3622 mGy). COMPLICATIONS: None immediate. TECHNIQUE: Following a full explanation of the procedure along with the potential associated complications, an informed witnessed consent was obtained from the spouse. The risks of intracranial  hemorrhage of 10%, worsening neurological deficit, ventilator dependency, death and inability to revascularize were all reviewed in detail with the patient's spouse. The patient was then put under general anesthesia by the Department of Anesthesiology at [facility]. The right groin was prepped and draped in the usual sterile fashion. Thereafter using modified Seldinger technique, transfemoral access into the right common femoral artery was obtained without difficulty. Over a 0.035 inch guidewire an 8 French 25 Pinnacle sheath was inserted. Through this, and also over a 0.035 inch guidewire a 5 French 125 cm Berenstein select inside of the balloon guide catheter combinationwas advanced to the aortic arch region and selectively positioned in the distal right common carotid artery. Guidewire, and the select catheter removed. Good aspiration obtained from the hub of the balloon guide catheter in the right common carotid artery. Arteriogram was then performed centered extra cranially and intracranially. FINDINGS: The right common carotid arteriogram demonstrates the right external carotid artery and its major branches to be widely patent. The right internal carotid artery at the bulb demonstrates a moderate sized plaque associated with complete occlusion just distal to the bulb. No evidence of distal angiographic string sign, or reconstitution of right internal carotid artery in the cavernous segment was noted. PROCEDURE: Through the balloon guide catheter, a combination of an 021 Trevo ProVue microcatheter over a 0.014 inch standard Synchro micro guidewire with a J configuration, access through the occluded  right internal carotid artery was obtained with the micro guidewire leading followed by the microcatheter. The combination was advanced to the horizontal petrous segment where the micro guidewire was removed. Poor aspiration was obtained from the microcatheter hub on account of the extensive clot in the  entire right internal carotid artery intracranially and extra cranially. The microcatheter was then exchanged for a 014 inch Synchro standard 300 cm exchange micro guidewire with a J-tip configuration. A control arteriogram performed through the balloon guide demonstrated minimally improved caliber through the proximal right internal carotid artery. A 4 mm x 30 mm Viatrac 14 angioplasty balloon catheter which had been prepped with 50% contrast and 50% heparinized saline infusion and integrated with 75% contrast and 25% heparinized saline infusion was advanced over the exchange micro guidewire using the rapid exchange technique. The proximal and distal markers were then positioned in the described landing zones. A control inflation was then performed using micro inflation syringe device via micro tubing to 12 atmospheres. This was maintained for about 20 seconds and retrieved proximally. A control arteriogram performed through the balloon guide demonstrated improved caliber in the bulb but there continued be a high-grade stenosis in the proximal right internal carotid artery. This prompted a second angioplasty in the manner as described above again to 12 atmospheres for approximately 20 seconds. The balloon was then deflated and retrieved and removed. A control arteriogram performed through the balloon guide demonstrated significantly improved caliber and flow through the angioplastied segment with improved flow to the more distal right internal carotid artery. However, there continued to be extensive clot noted within the internal carotid artery at the cranial skull base and the supraclinoid right ICA. An 021 Trevo ProVue catheter inside of a 071 134 cm aspiration catheter, combination was advanced over the exchange micro guidewire to the cavernous segment of the right internal carotid artery. Exchange micro guidewire was replaced with a regular 014 inch standard Synchro micro guidewire with a J configuration. This  was then advanced using a torque device through the occluded right middle cerebral artery into one of the inferior division branches in the M2 M3 region followed by the microcatheter. The guidewire was removed. Good aspiration obtained from the hub of the microcatheter. A gentle control arteriogram performed through this demonstrated slow flow through the vessel itself. A 4 mm x 40 mm Solitaire X retrieval device was then advanced to the distal end of the microcatheter. The retriever was deployed in the usual manner. The Zoom aspiration catheter was advanced to the mid right M1 segment. Continuous aspiration was then performed at the hub of the aspiration catheter for 2-1/2 minutes, with proximal flow arrest in the right internal carotid artery. The retrieval device, the microcatheter and the aspiration catheter were retrieved and removed. Copious amounts of clot were captured into the Tuohy Snelling, and also in the retrieval device. Following reversal of flow arrest, a control arteriogram performed through the balloon guide catheter demonstrated now improved flow through the proximal right internal carotid artery extra cranially and intracranially. There is now patency of the right anterior cerebral artery with a stump of the occluded right middle cerebral artery. A second pass was then made again using the above combination. The microcatheter was now accessed into the superior division of the right middle cerebral artery M2 M3 region over a 0.014 inch standard Synchro micro guidewire. Free aspiration of a blood was obtained at the hub of the microcatheter. A Tiger 21 retrieval device was then advanced to the distal  end of the retrieval device. This was then deployed by retrieving the microcatheter. The proximal portion of the retrieval device was within the proximal aspect of the occluded right middle cerebral artery. Free aspiration was then started through the aspiration catheter with the Penumbra aspiration  device which was positioned in the distal portion of the occluded clot. The retrieval device was then opened and closed until there was kinking at the proximal portion. The microcatheter was then advanced to the proximal marker on the retrieval device. With constant aspiration continued, the Tiger 21 retrieval device was then removed as constant aspiration was applied at the hub of the aspiration catheter, and also with proximal flow arrest and aspiration at the hub of the balloon guide with a 60 mL syringe. Multiple fragments of clot were obtained within the Drain, and also entangled in the retrieval device. With reversal of the proximal flow arrest, a control arteriogram performed through the balloon guide in the right internal carotid artery now demonstrated improved opacification of the right middle cerebral artery to where there was now opacification of the anterior temporal branch. Distal branch remains occluded with no change in anterior cerebral artery. A third pass was now made using an 027 150 cm Marksman microcatheter which was advanced again using the combination of the 071 Zoom aspiration catheter over a 0.014 inch standard Synchro micro guidewire. This combination was advanced into the now the inferior division with the microcatheter advanced into the M2 region of the inferior division. The micro guidewire was removed. Good aspiration obtained from the hub of the 027 microcatheter. A gentle control arteriogram performed through this now demonstrated free flow into the distal vasculature. The aspiration catheter was engaged into the occluded distal right middle cerebral artery at the origin of the inferior division. Aspiration was now at the hub of the aspiration catheter embedded in the right middle cerebral artery clot and also at the hub of the Marksman catheter for approximately 2 minutes, with proximal flow arrest in the right internal carotid artery and constant aspiration at its hub. The  Marksman catheter was then gently retrieved while maintaining aspiration through the Zoom aspiration catheter. The Zoom aspiration catheter remained loft to aspiration. This was then retrieved and removed. Again clots were seen in the aspirate. Control arteriogram performed following reversal of flow arrest in the right internal carotid artery now demonstrated complete revascularization of the right MCA distribution, into the distal distribution achieving a TICI 2c revascularization. The right anterior cerebral artery remained widely patent. Measurements were now performed of the right internal carotid artery proximally, and the right common carotid artery distally at the proposed landing zones for the revascularization stent. An 021 Trevo ProVue microcatheter was advanced to the petrous segment of the right internal carotid artery over a 0.014 inch standard Synchro micro guidewire, and replaced with an 014 inch 300 cm Synchro exchange micro guidewire. Safe position of the tip of the exchange micro guidewire was verified. It was elected to proceed with placement of a 6/8 mm x 40 mm Xact stent across the diseased previously occluded right internal carotid artery proximally. This was retrogradely purged with heparinized saline infusion. Using the rapid exchange technique, the delivery system of the stent was then advanced and positioned covering distally, and also the proximal portion of the landing zones. Stent was deployed in the usual manner without any difficulty. The delivery system was removed. Good aspiration obtained from the hub of the balloon guide in the right common carotid artery. A  control arteriogram performed through this demonstrated excellent positioning and apposition of the stent with now free flow noted into the intracranial right ICA and the right middle and right anterior cerebral arteries being widely patent. Prior to this patient was loaded with 81 mg aspirin, and 180 mg of Brilinta via an  orogastric tube. CT scan of the brain was then performed which demonstrated contrast blush of the right basal ganglia, and also to the 3 focal areas of hyperdensity suspicious for micro hemorrhages. 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 Three quarter bolus dose of Cangrelor was then given intravenously in order to salvage the occluding stent in the right internal carotid artery. The balloon guide was then removed following removal of the exchange micro guidewire. The diagnostic catheter was then positioned in the left common carotid artery. This demonstrated the left external carotid artery and the left internal carotid arteries to be widely patent. Patency of the left internal carotid to the cranial skull base was verified. Also the left middle and the left anterior cerebral arteries were seen to opacify into the capillary and venous phases. Also almost simultaneously cross-filling via the anterior communicating artery of the right anterior and the right middle cerebral arteries is noted via the anterior communicating artery. No gross filling defects were seen in the middle cerebral artery distribution. 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 The diagnostic catheter was removed. The 8 French Pinnacle sheath was then exchanged for a short 8 [place] sheath which in turn was then removed with the successful application of the 7 [place] ExoSeal closure device with hemostasis in the right groin. Distal pulses remained palpable in the feet bilaterally unchanged. A second CT scan of the brain demonstrated no significant change in the right basal ganglia with continued presence of at least 3 foci of  hemorrhage. No mass effect or midline shift was seen. Patient was left intubated on account of patient's medical condition and inability to respond appropriately to protect the airway. Patient was then transferred to the neuro ICU for post recanalization management. IMPRESSION: Status post endovascular complete revascularization of the right middle cerebral artery and the right anterior cerebral artery with 1 pass with a Solitaire 4 mm x 40 mm X retrieval device and aspiration, and 1 pass with the Tiger 21 retrieval device with proximal aspiration, and with 1 pass with contact aspiration achieving a TICI 2c revascularization of the right middle cerebral artery distribution. 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 Attempt to rescue with 3/4 bolus dose of Cangrelor IV. PLAN: Follow-up in the clinic 4 to 6 weeks post discharge. Electronically Signed   By: Luanne Bras M.D.   On: 06/30/2020 13:20    PHYSICAL EXAM  Temp:  [98.5 F (36.9 C)-99.2 F (37.3 C)] 98.6 F (37 C) (10/09 0800) Pulse Rate:  [44-66] 64 (10/09 0800) Resp:  [12-29] 12 (10/09 0800) BP: (99-177)/(50-77) 144/77 (10/09 0800) SpO2:  [95 %-100 %] 98 % (10/09 0800)  General - Well nourished, well developed, not in acute distress.  Ophthalmologic - fundi not visualized due to noncooperation.  Cardiovascular - Regular rate and rhythm.  Neuro - awake alert, eyes  open, oriented x 3, no aphasia, able to name and repeat but mild dysarthria, able to follow simple commands. Eyes in right gaze perference position but able to cross midline and left gaze incomplete, left hemianopia and visual neglect, however, able to track bilaterally, PERRL. Left facial droop and decreased eye closure. RUE and RLE at least 4/5. LUE proximal 2-/5 and distal 0/5, LLE 2/5 proximal and 0/5  distally. DTR 1+ and no babinski. Sensation symmetrical per pt, however, left sensory neglect. Right FTN intact on the right and gait not tested.   ASSESSMENT/PLAN Mr. MOOSA BUECHE is a 69 y.o. male with history of gout admitted for left-sided weakness, right gaze, left facial droop with fall. No tPA given due to outside window.    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 Evolution R frontoinsular infarcts. R ICA occluded.  Carotid Doppler confirmed right ICA re-occluded  2D Echo EF 50 to 55%  LE venous Doppler no DVT  LDL 93  HgbA1c 5.6  Off Heparin sq at request of GI given BRBPR, SCDsfor VTE prophylaxis    No antithrombotic prior to admission, now on aspirin 81 mg daily - off Brilinta given GIB and ICA has re-occluded.    Ongoing aggressive stroke risk factor management  Therapy recommendations:  CIR  Disposition:  Pending - CIR once off levophed and bed available  Transfer to floor once stable off levophed  Carotid occlusion  CTA head and neck right ICA and MCA occlusion  S/p carotid stenting due to severe athero but repeat carotid Doppler confirmed right ICA re-occluded  On ASA   Off brilinta due to LGIB and stent re-occluded  Hypotension and bradycardia Orthostatic hypotension  Likely related to carotid stenting  BP goal 120-160  Had orthostatic episode 10/7 with PT - BP down to 80s on sitting  On Levophed-> turn off -> resumed after  orthostatic episode  Cardiology on board  On Midodrine 10 Q8  Add florinef 0.1mg   Long term BP goal 130-150 given occluded right ICA  LGIB  BRBPR 07/02/2020  Hgb 11.7->10.1->10.3  Off Brilinta, sq heparin (aspirin continued)  GI on board  Currently conservative management   Considering colonoscopy if H&H dropping or continued LGIB - if needed, cards recs temp pacing (no permanent d/t likely transient due to carotid stent and potential for bacteremia)  Hyperlipidemia  Home meds: None  LDL 93, goal < 70  Add lipitor 40  Continue statin at discharge  Dysphagia  Passed swallow  On dys 1 and thin liquid  Speech on board  Other Stroke Risk Factors    Other Active Problems  Gout  Hypokalemia 3.6->3.4->3.5 - supplement  Hospital day # 5 Plan continue close observation and wean off pressor support if tolerated keep systolic blood pressure greater than 110.  Follow hematocrit.  Neuro exam stable.  May consider transfer out of the ICU tomorrow if off pressors.  Discussed with Dr. Marolyn Hammock critical care medicine and patient's wife and answered questions. This patient  is critically ill due to lower GI bleeding, syncope with hypotension, bradycardia, right ICA occlusion status post stent, stent reocclusion, right MCA stroke status post thrombectomy, and at significant risk of neurological worsening, death form recurrent stroke, hemorrhagic conversion, anemia with hypovolemic shock, seizure, cardiac arrest. This patient's care requires constant monitoring of vital signs, hemodynamics, respiratory and cardiac monitoring, review of multiple databases, neurological assessment, discussion with family, other specialists and medical decision making of high complexity. I spent 35 minutes of neurocritical care time in the care of this patient. I had long discussion with wife at bedside, updated pt current condition, treatment plan and potential prognosis, and answered all the  questions. She expressed understanding and appreciation.  I discussed with cardiology Dr Pearline Cables PCCM.  Antony Contras, MD      To contact Stroke Continuity provider, please refer to [email or web address] After hours, contact General Neurology

## 2020-07-04 NOTE — Progress Notes (Signed)
Progress Note   Subjective  Chief Complaint: GI bleed in the setting of recent stroke with hematochezia  Patient is found getting bathed, nursing staff reports no further bright red blood per rectum over the past 24 hours, his last bloody stool was on 07/02/2020.  His Brilinta and heparin were stopped yesterday 07/03/2020 and he continues on aspirin.  Patient is feeling well and has no new complaints today.    Objective   Vital signs in last 24 hours: Temp:  [98.5 F (36.9 C)-99.2 F (37.3 C)] 98.6 F (37 C) (10/09 0800) Pulse Rate:  [45-66] 53 (10/09 1000) Resp:  [12-29] 22 (10/09 1000) BP: (99-169)/(50-77) 101/51 (10/09 1000) SpO2:  [95 %-100 %] 97 % (10/09 1000) Last BM Date: 07/03/20 General:    white male in NAD Heart:  Regular rate and rhythm; no murmurs Lungs: Respirations even and unlabored, lungs CTA bilaterally Abdomen:  Soft, nontender and nondistended. Normal bowel sounds. Extremities:  Without edema. Neurologic:  Alert and oriented. Right sided hemiplegia Psych:  Cooperative. Normal mood and affect.  Intake/Output from previous day: 10/08 0701 - 10/09 0700 In: 1533.1 [P.O.:480; I.V.:1053.1] Out: 1365 [Urine:1365]  Lab Results: Recent Labs    07/02/20 0424 07/02/20 2310 07/03/20 0311  WBC 10.5  --  9.8  HGB 10.1* 10.7* 10.3*  HCT 30.1* 31.7* 30.6*  PLT 287  --  336   BMET Recent Labs    07/02/20 0424 07/03/20 0311  NA 135 135  K 3.4* 3.5  CL 104 105  CO2 22 21*  GLUCOSE 103* 102*  BUN 11 12  CREATININE 0.79 0.77  CALCIUM 8.4* 8.2*   Studies/Results: EEG adult  Result Date: 07/02/2020 Lora Havens, MD     07/02/2020  6:23 PM Patient Name: Tyler Richards MRN: [id number] Epilepsy Attending: Lora Havens Referring Physician/Provider: Dr Rosalin Hawking Date: 07/02/2020 Duration: 24.26 mins Patient history: 69 y.o. male with history of gout admitted for left-sided weakness, right gaze, left facial droop with fall. MRI showed acute infarct at the  right basal ganglia with nonprogressive hemorrhage as well as less extensive patchy acute cortical infarct in the right MCA territory EEG to evaluate for seizure. Level of alertness: Awake, asleep AEDs during EEG study: None Technical aspects: This EEG study was done with scalp electrodes positioned according to the 10-20 International system of electrode placement. Electrical activity was acquired at a sampling rate of 500Hz  and reviewed with a high frequency filter of 70Hz  and a low frequency filter of 1Hz . EEG data were recorded continuously and digitally stored. Description: The posterior dominant rhythm consists of 8 Hz activity of moderate voltage (25-35 uV) seen predominantly in posterior head regions, asymmetric ( R<L) and reactive to eye opening and eye closing. Sleep was characterized by vertex waves, sleep spindles (12 to 14 Hz), maximal frontocentral region.  EEG showed continuous 3 to 5 Hz theta-delta slowing in right hemisphere.  Hyperventilation and photic stimulation were not performed.   ABNORMALITY -Continuous slow, right hemisphere - Background asymmetry, right<left IMPRESSION: This study is suggestive of cortical dysfunction in right hemisphere consistent with underlying stroke. No seizures or epileptiform discharges were seen throughout the recording. Priyanka Barbra Sarks       Assessment / Plan:   Assessment: 1.  GI bleed: Started 07/02/2020, none over the past 48 hours since stopping Brilinta and subcu heparin, continues on aspirin, hemoglobin stable; most likely lower GI bleed exacerbated by blood thinners 2.  Bradycardia: Being followed by cardiology 3.  CVA: R MCA infarct due to R ICA and MCA occlusion, being followed by neurology, right-sided hemiplegia  Plan: 1.  Patient has had no further bleeding over the past 48 hours since patient's Brilinta and subcu heparin were stopped.  Hemoglobin is holding steady. 2.  Again we would be happy to help out with a colonoscopy after patient  is deemed stable from a cardiac standpoint, certainly if he needs to remain on Brilinta long-term.  Otherwise if he can stay on just aspirin then he may not need this procedure. 3.  Please await any further recommendations from Dr. Havery Moros later today.  Thank you for your kind consultation, we will continue to follow.   LOS: 5 days   Levin Erp  07/04/2020, 10:37 AM

## 2020-07-04 NOTE — Progress Notes (Signed)
NAME:  Tyler Richards, MRN:  [id number], DOB:  Dec 28, 1950, LOS: 5 ADMISSION DATE:  06/29/2020, CONSULTATION DATE:  06/29/20 REFERRING MD:  Estanislado Pandy  CHIEF COMPLAINT:  AMS   Brief History   This is a 69 year old who was admitted on 10/4 with left-sided weakness.  He underwent a M1 thrombectomy and a ICA stent placement.  There was early stent occlusion treated with dual antiplatelet therapy and a IIA IIIB inhibitor.  He developed an intracranial hemorrhage and has had postprocedural difficulties with intermittent bradycardia.    Past Medical History  has Arterial ischemic stroke, MCA, right, acute ([place]); Stroke (cerebrum) ([place]); Acute encephalopathy; Hypertension; Acute respiratory failure with hypoxia ([place]); Middle cerebral artery embolism, right; Lower GI bleed; Adverse reaction to antiplatelet agent; and Bradycardia on their problem list.  Significant Hospital Events   10/4 > admit. One episode of a small amount of red blood per rectum overnight from 10/7-10/8  Consults:  PCCM.  Procedures:  ETT 10/4 > 10/5  Significant Diagnostic Tests:  CT / CTA head 10/4 >  LVO R ICA / prox MCA  MRi brain 10/5 > Acute infarct at the right basal ganglia with nonprogressive hemorrhage when correlated with prior CT. Less extensive patchy acute cortical infarct in the right MCA territory. The areas of acute infarction correlate well with pretreatment CTP. 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  CT head 10/5 >  3.9 x 3.0 x 3.0 cm hyperdense region in the right posterior basal ganglia most consistent with parenchymal hematoma, given small internal fluid levels. Mild mass effect and surrounding edema. No idline shift. Chronic ischemic changes in the right posterior frontal region as seen previously. Echo 10/4 >  Left ventricular ejection fraction, by estimation, is 50 to 49%, grade 1 diastolic dysfunction  Micro Data:  COVID 10/4 > neg. Flu 10/4 >  neg.  Antimicrobials:  None.   Interim history/subjective:  Patient is alert and oriented x3, slightly lethargic but easily arouses to verbal stimuli. Continues to experience bradycardia and vasopressor support Wife at bedside and updated  Objective:  Blood pressure 136/65, pulse (!) 57, temperature 98.8 F (37.1 C), temperature source Axillary, resp. rate 18, height 6\' 2"  (1.88 m), weight 85.1 kg, SpO2 99 %.        Intake/Output Summary (Last 24 hours) at 07/04/2020 0804 Last data filed at 07/04/2020 0700 Gross per 24 hour  Intake 1293.07 ml  Output 1365 ml  Net -71.93 ml   Filed Weights   06/29/20 1730  Weight: 85.1 kg    Examination: General: Conversant and in no distress  HEENT:  Neuro: Speech content is appropriate he is not oriented to date.  Strength of the left upper extremity is 0 out of 5 proximally and distally he has 2.5-3 out of 5 proximal left lower extremity strength.  Pupils are equal, EOMs are full CV: S1 and S2 are regular there is no murmur rub or gallop, the monitor shows sinus bradycardia   PULM: He has no JVD, respirations are unlabored there is symmetric air movement no wheezes no dependent rales  GI: Abdomen is soft without organomegaly masses tenderness guarding or rebound  Extremities: There is no dependent edema.    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  As this has persisted for a substantial period following carotid manipulation, I will also be checking a TSH and free T4 in the morning to ensure that we do not have some other metabolic contributors.   Syncope  -When attempting to transfer from bed to chair 10/7 patient has a witnessed 11sec pause that resulted in a syncopal episode. At this time vasopressors were resumed.  He continues on Florinef and midodrine. P:  Keep atropine at bedside   Vasopressors as above  Avoid AV nodal blockades   GIB  - Seen with bright red bloody bowel movement 10/7 P: GI consulted  Will need to have endoscopy once stabilized  Continue to monitor for recurrent signs of bleeding   Hypokalemia P: Trend Bmet Supplement as needed   Best Practice:  Diet: Pured diet Pain/Anxiety/Delirium protocol (if indicated): N/A VAP protocol (if indicated): N/A DVT prophylaxis: SCD's. GI prophylaxis: PPI. Glucose control: SSI if glucose consistently > 180. Mobility: Bedrest. Code Status: Full. Family Communication: Per primary. Disposition: Remain in ICU due to change in neurological status  Labs   CBC: Recent Labs  Lab 06/29/20 0904 06/29/20 0915 06/30/20 0043 07/01/20 0433 07/02/20 0424 07/02/20 2310 07/03/20 0311  WBC 9.0  --  9.7 11.2* 10.5  --  9.8  NEUTROABS 6.8  --  7.2  --   --   --   --   HGB 13.9   < > 12.2* 11.7* 10.1* 10.7* 10.3*  HCT 41.2   < > 36.2* 34.2* 30.1* 31.7* 30.6*  MCV 96.5  --  97.1 99.1 96.2  --  96.2  PLT 320  --  380 329 287  --  336   < > = values in this interval not displayed.   Basic Metabolic Panel: Recent Labs  Lab 06/29/20 0904 06/29/20 0904 06/29/20 0915 06/29/20 0915 06/29/20 1738 06/30/20 0043 07/01/20 0433 07/02/20 0424 07/03/20 0311  NA 136   < > 138   < > 138 138 138 135 135  K 5.0   < > 4.5   < > 3.8 3.9 3.6 3.4* 3.5    CL 103   < > 105  --   --  106 106 104 105  CO2 20*  --   --   --   --  20* 21* 22 21*  GLUCOSE 104*   < > 105*  --   --  113* 107* 103* 102*  BUN 14   < > 17  --   --  10 8 11 12   CREATININE 0.87   < > 0.70  --   --  0.89 0.85 0.79 0.77  CALCIUM 9.1  --   --   --   --  8.4* 8.5* 8.4* 8.2*   < > = values in this interval not displayed.   GFR: Estimated Creatinine Clearance: 99.9 mL/min (by C-G formula based on SCr of 0.77 mg/dL). Recent Labs  Lab 06/30/20 0043 07/01/20 0433 07/02/20 0424 07/03/20 0311  WBC 9.7 11.2* 10.5 9.8   Liver Function Tests: Recent Labs  Lab 06/29/20 [phone]  AST 36  ALT 17  ALKPHOS 61  BILITOT 1.8*  PROT 6.5  ALBUMIN 3.3*   No results for input(s): LIPASE, AMYLASE in the last 168 hours. No results for input(s): AMMONIA in the last 168 hours. ABG    Component Value Date/Time   PHART 7.380 06/29/2020 1738   PCO2ART 41.3 06/29/2020 1738   PO2ART 252 (H) 06/29/2020 1738   HCO3 24.4 06/29/2020 1738   TCO2 26 06/29/2020 1738   ACIDBASEDEF 1.0 06/29/2020 1738   O2SAT 100.0 06/29/2020 1738    Coagulation Profile: Recent Labs  Lab 06/29/20 0904  INR 1.0   Cardiac Enzymes: No results for input(s): CKTOTAL, CKMB, CKMBINDEX, TROPONINI in the last 168 hours. HbA1C: Hgb A1c MFr Bld  Date/Time Value Ref Range Status  06/30/2020 12:43 AM 5.6 4.8 - 5.6 % Final    Comment:    (NOTE) Pre diabetes:          5.7%-6.4%  Diabetes:              >6.4%  Glycemic control for   <7.0% adults with diabetes    CBG: Recent Labs  Lab 06/29/20 0904  GLUCAP 88   CRITICAL CARE   Lars Masson, MD  Pulmonary & Critical Care Contact / Pager information can be found on Amion  07/04/2020, 8:04 AM

## 2020-07-04 NOTE — Progress Notes (Signed)
Patient Name: Tyler Richards Patient Profile     JAVARI BUFKIN a 69 y.o.malewith a history of longstanding but asymptomatic bradycardia HTN, HLD, and carotid artery disease with admission for CVAwho is being seen today for the evaluation of bradycardiaw pause of > 7 sec while transferring to chair (but attributed to prior manipulation of the carotid artery w stenting >> brilinta)  Acute GI bleeding and brilinta and heparin on hold   hypotension>> midodrine and transient norepinephrine    SUBJECTIVE without chest pain or shortness of breath.  Some movement of left hand and foot    Scheduled Meds:  Scheduled Meds:   stroke: mapping our early stages of recovery book   Does not apply Once   aspirin  81 mg Oral Daily   Or   aspirin  81 mg Per Tube Daily   atorvastatin  40 mg Oral Daily   chlorhexidine  15 mL Mouth Rinse BID   Chlorhexidine Gluconate Cloth  6 each Topical Daily   docusate  100 mg Oral BID   feeding supplement (ENSURE ENLIVE)  237 mL Oral TID BM   fludrocortisone  0.1 mg Oral Daily   influenza vaccine adjuvanted  0.5 mL Intramuscular Tomorrow-1000   mouth rinse  15 mL Mouth Rinse q12n4p   midodrine  10 mg Oral Q8H   pantoprazole  40 mg Oral Daily   pneumococcal 23 valent vaccine  0.5 mL Intramuscular Tomorrow-1000   polyethylene glycol  17 g Oral Daily   Continuous Infusions:  sodium chloride 75 mL/hr at 07/04/20 0700   norepinephrine (LEVOPHED) Adult infusion Stopped (07/04/20 0600)   acetaminophen **OR** acetaminophen (TYLENOL) oral liquid 160 mg/5 mL **OR** acetaminophen, senna-docusate    PHYSICAL EXAM Vitals:   07/04/20 0715 07/04/20 0730 07/04/20 0800 07/04/20 0900  BP: (!) 151/65 136/65 (!) 144/77 119/72  Pulse: (!) 56 (!) 57 64 (!) 57  Resp: (!) 21 18 12 17   Temp:   98.6 F (37 C)   TempSrc:   Oral   SpO2: 98% 99% 98% 100%  Weight:      Height:       Well developed and well nourished in no acute distress HENT  normal Neck supple   Clear  Regular rate and rhythm, no  gallop N  murmur Abd-soft with active BS No Clubbing cyanosis edema Skin-warm and dry A & Oriented minimal movement of left side Telemetry Personally reviewed some sinus pauses-brief    Intake/Output Summary (Last 24 hours) at 07/04/2020 1017 Last data filed at 07/04/2020 0700 Gross per 24 hour  Intake 1293.07 ml  Output 1365 ml  Net -71.93 ml    LABS: Basic Metabolic Panel: Recent Labs  Lab 06/29/20 0904 06/29/20 0904 06/29/20 0915 06/29/20 1738 06/30/20 0043 06/30/20 0043 07/01/20 0433 07/01/20 0433 07/02/20 0424 07/03/20 0311  NA 136  --  138 138 138  --  138  --  135 135  K 5.0  --  4.5 3.8 3.9  --  3.6  --  3.4* 3.5  CL 103  --  105  --  106  --  106  --  104 105  CO2 20*  --   --   --  20*  --  21*  --  22 21*  GLUCOSE 104*  --  105*  --  113*  --  107*  --  103* 102*  BUN 14  --  17  --  10  --  8  --  11 12  CREATININE 0.87  --  0.70  --  0.89  --  0.85  --  0.79 0.77  CALCIUM 9.1   < >  --   --  8.4*   < > 8.5*   < > 8.4* 8.2*   < > = values in this interval not displayed.   Cardiac Enzymes: No results for input(s): CKTOTAL, CKMB, CKMBINDEX, TROPONINI in the last 72 hours. CBC: Recent Labs  Lab 06/29/20 0904 06/29/20 0904 06/29/20 0915 06/29/20 1738 06/30/20 0043 07/01/20 0433 07/02/20 0424 07/02/20 2310 07/03/20 0311  WBC 9.0  --   --   --  9.7 11.2* 10.5  --  9.8  NEUTROABS 6.8  --   --   --  7.2  --   --   --   --   HGB 13.9   < > 14.3 11.9* 12.2* 11.7* 10.1* 10.7* 10.3*  HCT 41.2   < > 42.0 35.0* 36.2* 34.2* 30.1* 31.7* 30.6*  MCV 96.5  --   --   --  97.1 99.1 96.2  --  96.2  PLT 320  --   --   --  380 329 287  --  336   < > = values in this interval not displayed.   PROTIME: No results for input(s): LABPROT, INR in the last 72 hours. Liver Function Tests: No results for input(s): AST, ALT, ALKPHOS, BILITOT, PROT, ALBUMIN in the last 72 hours. No results for input(s): LIPASE,  AMYLASE in the last 72 hours. BNP: BNP (last 3 results) No results for input(s): BNP in the last 8760 hours.  ProBNP (last 3 results) No results for input(s): PROBNP in the last 8760 hours.  D-Dimer: No results for input(s): DDIMER in the last 72 hours. Hemoglobin A1C: No results for input(s): HGBA1C in the last 72 hours. Fasting Lipid Panel: Recent Labs    07/03/20 0311  TRIG 77       ASSESSMENT AND PLAN:  Active Problems:   Arterial ischemic stroke, MCA, right, acute ([facility])   Stroke (cerebrum) ([facility])   Middle cerebral artery embolism, right   Lower GI bleed   Adverse reaction to antiplatelet agent   Bradycardia  Bradycardia following carotid manipulation has been seen in my experience although years ago when it first encountered I could not find much written on it.  In that situation gradually improved.  We will follow for now.  Currently off anticoagulation and brilinta  Feels a little bit like the sword of Damocles with a "barely" protected stent  Does he need midodrine?     Signed, Virl Axe MD  07/04/2020

## 2020-07-05 DIAGNOSIS — K922 Gastrointestinal hemorrhage, unspecified: Secondary | ICD-10-CM | POA: Diagnosis not present

## 2020-07-05 DIAGNOSIS — I6601 Occlusion and stenosis of right middle cerebral artery: Secondary | ICD-10-CM | POA: Diagnosis not present

## 2020-07-05 DIAGNOSIS — T457X5A Adverse effect of anticoagulant antagonists, vitamin K and other coagulants, initial encounter: Secondary | ICD-10-CM | POA: Diagnosis not present

## 2020-07-05 DIAGNOSIS — R001 Bradycardia, unspecified: Secondary | ICD-10-CM | POA: Diagnosis not present

## 2020-07-05 LAB — CBC WITH DIFFERENTIAL/PLATELET
Abs Immature Granulocytes: 0.05 10*3/uL (ref 0.00–0.07)
Basophils Absolute: 0 10*3/uL (ref 0.0–0.1)
Basophils Relative: 0 %
Eosinophils Absolute: 0 10*3/uL (ref 0.0–0.5)
Eosinophils Relative: 0 %
HCT: 29.1 % — ABNORMAL LOW (ref 39.0–52.0)
Hemoglobin: 9.8 g/dL — ABNORMAL LOW (ref 13.0–17.0)
Immature Granulocytes: 1 %
Lymphocytes Relative: 11 %
Lymphs Abs: 0.9 10*3/uL (ref 0.7–4.0)
MCH: 32.7 pg (ref 26.0–34.0)
MCHC: 33.7 g/dL (ref 30.0–36.0)
MCV: 97 fL (ref 80.0–100.0)
Monocytes Absolute: 0.9 10*3/uL (ref 0.1–1.0)
Monocytes Relative: 11 %
Neutro Abs: 6 10*3/uL (ref 1.7–7.7)
Neutrophils Relative %: 77 %
Platelets: 365 10*3/uL (ref 150–400)
RBC: 3 MIL/uL — ABNORMAL LOW (ref 4.22–5.81)
RDW: 12.3 % (ref 11.5–15.5)
WBC: 7.8 10*3/uL (ref 4.0–10.5)
nRBC: 0 % (ref 0.0–0.2)

## 2020-07-05 LAB — BASIC METABOLIC PANEL
Anion gap: 10 (ref 5–15)
BUN: 14 mg/dL (ref 8–23)
CO2: 21 mmol/L — ABNORMAL LOW (ref 22–32)
Calcium: 8 mg/dL — ABNORMAL LOW (ref 8.9–10.3)
Chloride: 106 mmol/L (ref 98–111)
Creatinine, Ser: 0.89 mg/dL (ref 0.61–1.24)
GFR, Estimated: >60 mL/min (ref >60)
Glucose, Bld: 93 mg/dL (ref 70–99)
Potassium: 3.3 mmol/L — ABNORMAL LOW (ref 3.5–5.1)
Sodium: 137 mmol/L (ref 135–145)

## 2020-07-05 LAB — TSH: TSH: 1.012 u[IU]/mL (ref 0.350–4.500)

## 2020-07-05 LAB — T4, FREE: Free T4: 1.27 ng/dL — ABNORMAL HIGH (ref 0.61–1.12)

## 2020-07-05 MED ORDER — POTASSIUM CHLORIDE CRYS ER 20 MEQ PO TBCR: 40.0000 meq | EXTENDED_RELEASE_TABLET | Freq: Once | ORAL | Status: DC

## 2020-07-05 MED ORDER — POTASSIUM CHLORIDE 20 MEQ PO PACK
40.0000 meq | PACK | Freq: Once | ORAL | Status: AC
  Administered 2020-07-05: 40 meq via ORAL
  Filled 2020-07-05: qty 2

## 2020-07-05 NOTE — Progress Notes (Signed)
Patient Name: Tyler Richards Patient Profile     Tyler Richards a 69 y.o.malewith a history of longstanding but asymptomatic bradycardia HTN, HLD, and carotid artery disease with admission for CVAwho is being seen today for the evaluation of bradycardiaw pause of > 7 sec while transferring to chair (but attributed to prior manipulation of the carotid artery w stenting >> brilinta)  Acute GI bleeding and brilinta and heparin on hold   hypotension>> midodrine and transient norepinephrine    SUBJECTIVE some movement No chest pain or sob  Some confusion     Scheduled Meds:  Scheduled Meds:   stroke: mapping our early stages of recovery book   Does not apply Once   aspirin  81 mg Oral Daily   Or   aspirin  81 mg Per Tube Daily   atorvastatin  40 mg Oral Daily   chlorhexidine  15 mL Mouth Rinse BID   Chlorhexidine Gluconate Cloth  6 each Topical Daily   docusate  100 mg Oral BID   feeding supplement (ENSURE ENLIVE)  237 mL Oral TID BM   fludrocortisone  0.1 mg Oral Daily   influenza vaccine adjuvanted  0.5 mL Intramuscular Tomorrow-1000   mouth rinse  15 mL Mouth Rinse q12n4p   midodrine  10 mg Oral Q8H   pantoprazole  40 mg Oral Daily   pneumococcal 23 valent vaccine  0.5 mL Intramuscular Tomorrow-1000   polyethylene glycol  17 g Oral Daily   Continuous Infusions:  sodium chloride 75 mL/hr at 07/05/20 0800   norepinephrine (LEVOPHED) Adult infusion Stopped (07/04/20 0600)   acetaminophen **OR** acetaminophen (TYLENOL) oral liquid 160 mg/5 mL **OR** acetaminophen, senna-docusate    PHYSICAL EXAM Vitals:   07/05/20 0500 07/05/20 0600 07/05/20 0700 07/05/20 0800  BP: 138/69 137/62 133/64 119/80  Pulse: 68 (!) 58 (!) 58 (!) 54  Resp: (!) 21 (!) 26 (!) 26 (!) 25  Temp:      TempSrc:      SpO2: 99% 98% 97% 97%  Weight:      Height:       Well developed and nourished in no acute distress HENT normal Neck supple   Clear Regular rate and  rhythm  Abd-soft   No Clubbing cyanosis edema Skin-warm and dry A & Oriented L hemiparesis  ECG some sinus slowing but without signficant pauses      Intake/Output Summary (Last 24 hours) at 07/05/2020 1027 Last data filed at 07/05/2020 0800 Gross per 24 hour  Intake 1860.11 ml  Output 950 ml  Net 910.11 ml    LABS: Basic Metabolic Panel: Recent Labs  Lab 06/29/20 0904 06/29/20 0904 06/29/20 0915 06/29/20 1738 06/30/20 0043 06/30/20 0043 07/01/20 0433 07/01/20 0433 07/02/20 0424 07/02/20 0424 07/03/20 0311 07/05/20 0314  NA 136   < > 138 138 138  --  138  --  135  --  135 137  K 5.0   < > 4.5 3.8 3.9  --  3.6  --  3.4*  --  3.5 3.3*  CL 103  --  105  --  106  --  106  --  104  --  105 106  CO2 20*  --   --   --  20*  --  21*  --  22  --  21* 21*  GLUCOSE 104*  --  105*  --  113*  --  107*  --  103*  --  102* 93  BUN 14  --  17  --  10  --  8  --  11  --  12 14  CREATININE 0.87  --  0.70  --  0.89  --  0.85  --  0.79  --  0.77 0.89  CALCIUM 9.1   < >  --   --  8.4*   < > 8.5*   < > 8.4*   < > 8.2* 8.0*   < > = values in this interval not displayed.   Cardiac Enzymes: No results for input(s): CKTOTAL, CKMB, CKMBINDEX, TROPONINI in the last 72 hours. CBC: Recent Labs  Lab 06/29/20 0904 06/29/20 0915 06/29/20 1738 06/30/20 0043 07/01/20 0433 07/02/20 0424 07/02/20 2310 07/03/20 0311 07/05/20 0314  WBC 9.0  --   --  9.7 11.2* 10.5  --  9.8 7.8  NEUTROABS 6.8  --   --  7.2  --   --   --   --  6.0  HGB 13.9   < > 11.9* 12.2* 11.7* 10.1* 10.7* 10.3* 9.8*  HCT 41.2   < > 35.0* 36.2* 34.2* 30.1* 31.7* 30.6* 29.1*  MCV 96.5  --   --  97.1 99.1 96.2  --  96.2 97.0  PLT 320  --   --  380 329 287  --  336 365   < > = values in this interval not displayed.   PROTIME: No results for input(s): LABPROT, INR in the last 72 hours. Liver Function Tests: No results for input(s): AST, ALT, ALKPHOS, BILITOT, PROT, ALBUMIN in the last 72 hours. No results for input(s):  LIPASE, AMYLASE in the last 72 hours. BNP: BNP (last 3 results) No results for input(s): BNP in the last 8760 hours.  ProBNP (last 3 results) No results for input(s): PROBNP in the last 8760 hours.  D-Dimer: No results for input(s): DDIMER in the last 72 hours. Hemoglobin A1C: No results for input(s): HGBA1C in the last 72 hours. Fasting Lipid Panel: Recent Labs    07/03/20 0311  TRIG 77       ASSESSMENT AND PLAN:  Active Problems:   Arterial ischemic stroke, MCA, right, acute ([facility])   Stroke (cerebrum) ([facility])   Middle cerebral artery embolism, right   Lower GI bleed   Adverse reaction to antiplatelet agent   Bradycardia  Bradycardia following carotid manipulation has been seen in my experience although years ago when it first encountered I could not find much written on it.  In that situation gradually improved.  We will follow for now.  Currently off anticoagulation and brilinta 2/2 GI bleed  Feels a little bit like the sword of Damocles with a "barely" protected stent but remains on ASA  Hgb stable and would discuss with IR aobut antiplatelet therapy   Does he need midodrine?     Signed, Virl Axe MD  07/05/2020

## 2020-07-05 NOTE — Progress Notes (Signed)
Pharmacy Electrolyte Replacement  Recent Labs:  Recent Labs    07/05/20 0314  K 3.3*  CREATININE 0.89    Low Critical Values (K </= 2.5, Phos </= 1, Mg </= 1) Present: None   Plan:  - K 3.3 - supplement with KCl 40 mEq x 1 - Repeat K with AM labs  Thank you for allowing pharmacy to be a part of this patient's care.  Alycia Rossetti, PharmD, BCPS Clinical Pharmacist Clinical phone for 07/05/2020: [phone] 07/05/2020 1:35 PM   **Pharmacist phone directory can now be found on [email or web address] ([email or web address]).  Listed under [place].

## 2020-07-05 NOTE — Progress Notes (Signed)
STROKE TEAM PROGRESS NOTE   SUBJECTIVE (INTERVAL HISTORY) Wife at bedside.  Patient reclining in bed, awake alert, neuro stable.  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  BP stable of Levophed now 24 hrs.. No more gi bleeding. Hct stable at 29.1. Potassium low at 3.3.  Neuro exam unchanged.  OBJECTIVE Temp:  [98.4 F (36.9 C)-100 F (37.8 C)] 98.7 F (37.1 C) (10/10 0400) Pulse Rate:  [50-68] 58 (10/10 0600) Cardiac Rhythm: Normal sinus rhythm;Sinus bradycardia (10/10 0400) Resp:  [12-26] 26 (10/10 0600) BP: (92-151)/(51-77) 137/62 (10/10 0600) SpO2:  [96 %-100 %] 98 % (10/10 0600)  Recent Labs  Lab 06/29/20 0904  GLUCAP 88   Recent Labs  Lab 06/30/20 0043 06/30/20 0043 07/01/20 0433 07/01/20 0433 07/02/20 0424 07/03/20 0311 07/05/20 0314  NA 138  --  138  --  135 135 137  K 3.9  --  3.6  --  3.4* 3.5 3.3*  CL 106  --  106  --  104 105 106  CO2 20*  --  21*  --  22 21* 21*  GLUCOSE 113*  --  107*  --  103* 102* 93  BUN 10  --  8  --  11 12 14   CREATININE 0.89  --  0.85  --  0.79 0.77 0.89  CALCIUM 8.4*   < > 8.5*   < > 8.4* 8.2* 8.0*   < > = values in this interval not displayed.   Recent Labs  Lab 06/29/20 0904  AST 36  ALT 17  ALKPHOS 61  BILITOT 1.8*  PROT 6.5  ALBUMIN 3.3*   Recent Labs  Lab 06/29/20 0904 06/29/20 0915 06/30/20 0043 06/30/20 0043 07/01/20 0433 07/02/20 0424 07/02/20 2310 07/03/20 0311 07/05/20 0314  WBC 9.0   < > 9.7  --  11.2* 10.5  --  9.8 7.8  NEUTROABS 6.8  --  7.2  --   --   --   --   --  6.0  HGB 13.9   < > 12.2*   < > 11.7* 10.1* 10.7* 10.3* 9.8*  HCT 41.2   < > 36.2*   < > 34.2* 30.1* 31.7* 30.6* 29.1*  MCV 96.5   < > 97.1  --  99.1 96.2  --  96.2 97.0  PLT 320   < > 380  --  329 287  --  336 365   < > = values in this interval not displayed.   No results for input(s): COLORURINE, LABSPEC, [place], GLUCOSEU, HGBUR, BILIRUBINUR, KETONESUR, PROTEINUR, UROBILINOGEN, NITRITE, LEUKOCYTESUR in the last 72  hours.  Invalid input(s): APPERANCEUR     Component Value Date/Time   CHOL 149 06/30/2020 0043   TRIG 77 07/03/2020 0311   HDL 23 (L) 06/30/2020 0043   CHOLHDL 6.5 06/30/2020 0043   VLDL 33 06/30/2020 0043   LDLCALC 93 06/30/2020 0043   Lab Results  Component Value Date   HGBA1C 5.6 06/30/2020      Component Value Date/Time   LABOPIA NONE DETECTED 06/29/2020 1749   COCAINSCRNUR NONE DETECTED 06/29/2020 1749   LABBENZ NONE DETECTED 06/29/2020 1749   AMPHETMU NONE DETECTED 06/29/2020 1749   THCU NONE DETECTED 06/29/2020 1749   LABBARB NONE DETECTED 06/29/2020 1749    Recent Labs  Lab 06/29/20 0904  ETH <10    I have personally reviewed the radiological images below and agree with the radiology interpretations.  CT Code Stroke CTA Head W/WO contrast  Result Date: 06/29/2020  CLINICAL DATA:  Hyperdense vessel and stroke-like symptoms EXAM: CT ANGIOGRAPHY HEAD AND NECK CT PERFUSION BRAIN TECHNIQUE: Multidetector CT imaging of the head and neck was performed using the standard protocol during bolus administration of intravenous contrast. Multiplanar CT image reconstructions and MIPs were obtained to evaluate the vascular anatomy. Carotid stenosis measurements (when applicable) are obtained utilizing NASCET criteria, using the distal internal carotid diameter as the denominator. Multiphase CT imaging of the brain was performed following IV bolus contrast injection. Subsequent parametric perfusion maps were calculated using RAPID software. CONTRAST:  Dose is not yet known COMPARISON:  None. FINDINGS: CTA NECK FINDINGS Aortic arch: Mild atheromatous changes. Three vessel branching. No acute finding. Right carotid system: Atherosclerotic plaque about the bifurcation with occluded ICA bulb. No flow seen within the ICA in the neck. Left carotid system: Atheromatous plaque at the bifurcation and bulb. No stenosis or ulceration. Negative for beading. Vertebral arteries: Low-density plaque at the  left subclavian origin. No flow limiting subclavian stenosis. Plaque at the left vertebral origin without stenosis, beading, or dissection. Skeleton: Focal advanced C5-6 disc degeneration with ridging causing biforaminal impingement. Ordinary facet osteoarthritis. Other neck: No acute or aggressive finding. Right-sided chronic sinusitis with pattern middle meatus obstruction. Upper chest: Granulomatous nodal calcifications. There is also a calcified granuloma appearance in the left upper lobe. Review of the MIP images confirms the above findings CTA HEAD FINDINGS Anterior circulation: No flow seen in the right carotid or MCA branches. There is distal M2 M3 branch reconstitution. Calcified plaque along the left carotid siphon. No MCA branch occlusion, aneurysm, or beading. Posterior circulation: The vertebral and basilar arteries are smooth and widely patent. High-grade atheromatous narrowings of the bilateral PCA, P3 segment on the right and upper first order branch on the left. Venous sinuses: Patent as permitted by contrast timing Anatomic variants: None significant Review of the MIP images confirms the above findings CT Brain Perfusion Findings: ASPECTS: 9 CBF (<30%) Volume: 27mL Perfusion (Tmax>6.0s) volume: 147mL Mismatch Volume: 142mL Infarction Location:Right basal ganglia and lesser extensive lateral frontal cortex infarct. Critical Value/emergent results were called by telephone at the time of interpretation on 06/29/2020 at 9:37 am to provider ERIC [facility] , who verbally acknowledged these results. IMPRESSION: 1. Emergent large vessel occlusion with no flow seen in the right internal carotid or proximal MCA. There is a 36 cc core infarct and 142 cc of penumbra by CT perfusion. 2. Atherosclerosis without flow limiting stenosis of the other major vessels. 3. Bilateral high-grade atheromatous PCA narrowings, more proximal on the right. 4. 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  Electronically Signed   By: Monte Fantasia M.D.   On: 06/29/2020 09:53   CT HEAD WO CONTRAST  Result Date: 07/01/2020 CLINICAL DATA:  Follow-up intracranial hemorrhage EXAM: CT HEAD WITHOUT CONTRAST TECHNIQUE: Contiguous axial images were obtained from the base of the skull through the vertex without intravenous contrast. COMPARISON:  CT from 2 days ago FINDINGS: Brain: More contracted and homogeneous hematoma in the right basal ganglia measuring up to 2.8 x 2 cm. Cytotoxic edema from infarct has become apparent in the right basal ganglia and adjacent frontal insular cortex. No interval hemorrhage, hydrocephalus, or midline shift. Vascular: The right ICA is hyperdense below the skull base, unchanged. Skull: No acute or aggressive finding Sinuses/Orbits: Chronic right maxillary, ethmoid, and frontal sinusitis. Generalized mucosal thickening seen elsewhere, progressed. IMPRESSION: 1. Interval contraction of the right basal ganglia hematoma. 2. Expected evolution of the right frontoinsular infarcts. 3. 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 4. No new abnormality. Electronically Signed   By: Monte Fantasia M.D.   On: 07/01/2020 07:16   CT HEAD WO CONTRAST  Result Date: 06/29/2020 CLINICAL DATA:  Follow-up stroke and subsequent intervention. EXAM: CT HEAD WITHOUT CONTRAST TECHNIQUE: Contiguous axial images were obtained from the base of the skull through the vertex without intravenous contrast. COMPARISON:  Earlier same day FINDINGS: Brain: 3.9 x 3.0 x 3.0 cm hyperdense region in the right posterior basal ganglia most consistent with a parenchymal hematoma, given small internal fluid levels. Mild mass effect and surrounding edema. No midline shift. Chronic ischemic changes in the right posterior frontal region as seen previously. Vascular: No other new vascular finding. Skull: Negative Sinuses/Orbits: Opacified right maxillary and ethmoid sinuses. Orbits negative. Other: None IMPRESSION: 1. 3.9  x 3.0 x 3.0 cm hyperdense region in the right posterior basal ganglia most consistent with parenchymal hematoma, given small internal fluid levels. Mild mass effect and surrounding edema. No midline shift. 2. Chronic ischemic changes in the right posterior frontal region as seen previously. 3. Opacified right maxillary and ethmoid sinuses. Electronically Signed   By: Nelson Chimes M.D.   On: 06/29/2020 18:48   CT Code Stroke CTA Neck W/WO contrast  Result Date: 06/29/2020 CLINICAL DATA:  Hyperdense vessel and stroke-like symptoms EXAM: CT ANGIOGRAPHY HEAD AND NECK CT PERFUSION BRAIN TECHNIQUE: Multidetector CT imaging of the head and neck was performed using the standard protocol during bolus administration of intravenous contrast. Multiplanar CT image reconstructions and MIPs were obtained to evaluate the vascular anatomy. Carotid stenosis measurements (when applicable) are obtained utilizing NASCET criteria, using the distal internal carotid diameter as the denominator. Multiphase CT imaging of the brain was performed following IV bolus contrast injection. Subsequent parametric perfusion maps were calculated using RAPID software. CONTRAST:  Dose is not yet known COMPARISON:  None. FINDINGS: CTA NECK FINDINGS Aortic arch: Mild atheromatous changes. Three vessel branching. No acute finding. Right carotid system: Atherosclerotic plaque about the bifurcation with occluded ICA bulb. No flow seen within the ICA in the neck. Left carotid system: Atheromatous plaque at the bifurcation and bulb. No stenosis or ulceration. Negative for beading. Vertebral arteries: Low-density plaque at the left subclavian origin. No flow limiting subclavian stenosis. Plaque at the left vertebral origin without stenosis, beading, or dissection. Skeleton: Focal advanced C5-6 disc degeneration with ridging causing biforaminal impingement. Ordinary facet osteoarthritis. Other neck: No acute or aggressive finding. Right-sided chronic  sinusitis with pattern middle meatus obstruction. Upper chest: Granulomatous nodal calcifications. There is also a calcified granuloma appearance in the left upper lobe. Review of the MIP images confirms the above findings CTA HEAD FINDINGS Anterior circulation: No flow seen in the right carotid or MCA branches. There is distal M2 M3 branch reconstitution. Calcified plaque along the left carotid siphon. No MCA branch occlusion, aneurysm, or beading. Posterior circulation: The vertebral and basilar arteries are smooth and widely patent. High-grade atheromatous narrowings of the bilateral PCA, P3 segment on the right and upper first order branch on the left. Venous sinuses: Patent as permitted by contrast timing Anatomic variants: None significant Review of the MIP images confirms the above findings CT Brain Perfusion Findings: ASPECTS: 9 CBF (<30%) Volume: 62mL Perfusion (Tmax>6.0s) volume: 143mL Mismatch Volume: 143mL Infarction Location:Right basal ganglia and lesser extensive lateral frontal cortex infarct. Critical Value/emergent results were called by telephone at the time of interpretation on 06/29/2020 at 9:37 am to provider ERIC [facility] , who verbally acknowledged  these results. IMPRESSION: 1. Emergent large vessel occlusion with no flow seen in the right internal carotid or proximal MCA. There is a 36 cc core infarct and 142 cc of penumbra by CT perfusion. 2. Atherosclerosis without flow limiting stenosis of the other major vessels. 3. Bilateral high-grade atheromatous PCA narrowings, more proximal on the right. 4. 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 Electronically Signed   By: Monte Fantasia M.D.   On: 06/29/2020 09:53   MR BRAIN WO CONTRAST  Result Date: 06/30/2020 CLINICAL DATA:  Stroke follow-up EXAM: MRI HEAD WITHOUT CONTRAST TECHNIQUE: Multiplanar, multiecho pulse sequences of the brain and surrounding structures were obtained without intravenous contrast.  COMPARISON:  Head CT and CTA from yesterday FINDINGS: Brain: Acute infarction at the right stratum and patchy along the insula and lateral frontal cortex. The area of infarction appears similar to the area of pre treatment core infarct. Hematoma centered at the right putamen with dimensions better assessed by prior CT, grossly similar at to 3.5 cm anterior to posterior. Correlating to fluid fluid level, there is a posterior dense clot appearance and more heterogeneous anterior component. On gradient imaging petechial hemorrhage is seen along the right MCA watershed. No hydrocephalus or shift. Remote high right frontal cortex infarct anteriorly. Vascular: No flow voids seen in the left ICA beginning in the upper cervical spine. There is normalization by the supraclinoid segment. Skull and upper cervical spine: No focal marrow lesion Sinuses/Orbits: Right middle meatus obstruction with frontal, ethmoid, and especially maxillary sinusitis on the right. IMPRESSION: 1. Acute infarct at the right basal ganglia with nonprogressive hemorrhage when correlated with prior CT. Less extensive patchy acute cortical infarct in the right MCA territory. The areas of acute infarction correlate well with pretreatment CTP. 2. 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 Electronically Signed   By: Monte Fantasia M.D.   On: 06/30/2020 04:22   IR INTRAVSC STENT CERV CAROTID W/O EMB-PROT MOD SED  Result Date: 07/02/2020 INDICATION: New onset right gaze deviation, left hemiplegia, and left-sided neglect. EXAM: 1. EMERGENT LARGE VESSEL OCCLUSION THROMBOLYSIS anterior CIRCULATION) COMPARISON:  CT angiogram of the head and neck of June 29, 2020. MEDICATIONS: Ancef 2 g IV was administered within 1 hour of the procedure. ANESTHESIA/SEDATION: General anesthesia CONTRAST:  Isovue 300 approximately 165 mL FLUOROSCOPY TIME:  Fluoroscopy Time: 97 minutes 0 seconds (3622 mGy). COMPLICATIONS: None  immediate. TECHNIQUE: Following a full explanation of the procedure along with the potential associated complications, an informed witnessed consent was obtained from the spouse. The risks of intracranial hemorrhage of 10%, worsening neurological deficit, ventilator dependency, death and inability to revascularize were all reviewed in detail with the patient's spouse. The patient was then put under general anesthesia by the Department of Anesthesiology at [facility]. The right groin was prepped and draped in the usual sterile fashion. Thereafter using modified Seldinger technique, transfemoral access into the right common femoral artery was obtained without difficulty. Over a 0.035 inch guidewire an 8 French 25 Pinnacle sheath was inserted. Through this, and also over a 0.035 inch guidewire a 5 French 125 cm Berenstein select inside of the balloon guide catheter combinationwas advanced to the aortic arch region and selectively positioned in the distal right common carotid artery. Guidewire, and the select catheter removed. Good aspiration obtained from the hub of the balloon guide catheter in the right common carotid artery. Arteriogram was then performed centered extra cranially and intracranially. FINDINGS: The right common  carotid arteriogram demonstrates the right external carotid artery and its major branches to be widely patent. The right internal carotid artery at the bulb demonstrates a moderate sized plaque associated with complete occlusion just distal to the bulb. No evidence of distal angiographic string sign, or reconstitution of right internal carotid artery in the cavernous segment was noted. PROCEDURE: Through the balloon guide catheter, a combination of an 021 Trevo ProVue microcatheter over a 0.014 inch standard Synchro micro guidewire with a J configuration, access through the occluded right internal carotid artery was obtained with the micro guidewire leading followed by the  microcatheter. The combination was advanced to the horizontal petrous segment where the micro guidewire was removed. Poor aspiration was obtained from the microcatheter hub on account of the extensive clot in the entire right internal carotid artery intracranially and extra cranially. The microcatheter was then exchanged for a 014 inch Synchro standard 300 cm exchange micro guidewire with a J-tip configuration. A control arteriogram performed through the balloon guide demonstrated minimally improved caliber through the proximal right internal carotid artery. A 4 mm x 30 mm Viatrac 14 angioplasty balloon catheter which had been prepped with 50% contrast and 50% heparinized saline infusion and integrated with 75% contrast and 25% heparinized saline infusion was advanced over the exchange micro guidewire using the rapid exchange technique. The proximal and distal markers were then positioned in the described landing zones. A control inflation was then performed using micro inflation syringe device via micro tubing to 12 atmospheres. This was maintained for about 20 seconds and retrieved proximally. A control arteriogram performed through the balloon guide demonstrated improved caliber in the bulb but there continued be a high-grade stenosis in the proximal right internal carotid artery. This prompted a second angioplasty in the manner as described above again to 12 atmospheres for approximately 20 seconds. The balloon was then deflated and retrieved and removed. A control arteriogram performed through the balloon guide demonstrated significantly improved caliber and flow through the angioplastied segment with improved flow to the more distal right internal carotid artery. However, there continued to be extensive clot noted within the internal carotid artery at the cranial skull base and the supraclinoid right ICA. An 021 Trevo ProVue catheter inside of a 071 134 cm aspiration catheter, combination was advanced over the  exchange micro guidewire to the cavernous segment of the right internal carotid artery. Exchange micro guidewire was replaced with a regular 014 inch standard Synchro micro guidewire with a J configuration. This was then advanced using a torque device through the occluded right middle cerebral artery into one of the inferior division branches in the M2 M3 region followed by the microcatheter. The guidewire was removed. Good aspiration obtained from the hub of the microcatheter. A gentle control arteriogram performed through this demonstrated slow flow through the vessel itself. A 4 mm x 40 mm Solitaire X retrieval device was then advanced to the distal end of the microcatheter. The retriever was deployed in the usual manner. The Zoom aspiration catheter was advanced to the mid right M1 segment. Continuous aspiration was then performed at the hub of the aspiration catheter for 2-1/2 minutes, with proximal flow arrest in the right internal carotid artery. The retrieval device, the microcatheter and the aspiration catheter were retrieved and removed. Copious amounts of clot were captured into the [place], and also in the retrieval device. Following reversal of flow arrest, a control arteriogram performed through the balloon guide catheter demonstrated now improved flow through the proximal right  internal carotid artery extra cranially and intracranially. There is now patency of the right anterior cerebral artery with a stump of the occluded right middle cerebral artery. A second pass was then made again using the above combination. The microcatheter was now accessed into the superior division of the right middle cerebral artery M2 M3 region over a 0.014 inch standard Synchro micro guidewire. Free aspiration of a blood was obtained at the hub of the microcatheter. A Tiger 21 retrieval device was then advanced to the distal end of the retrieval device. This was then deployed by retrieving the microcatheter. The  proximal portion of the retrieval device was within the proximal aspect of the occluded right middle cerebral artery. Free aspiration was then started through the aspiration catheter with the Penumbra aspiration device which was positioned in the distal portion of the occluded clot. The retrieval device was then opened and closed until there was kinking at the proximal portion. The microcatheter was then advanced to the proximal marker on the retrieval device. With constant aspiration continued, the Tiger 21 retrieval device was then removed as constant aspiration was applied at the hub of the aspiration catheter, and also with proximal flow arrest and aspiration at the hub of the balloon guide with a 60 mL syringe. Multiple fragments of clot were obtained within the [place], and also entangled in the retrieval device. With reversal of the proximal flow arrest, a control arteriogram performed through the balloon guide in the right internal carotid artery now demonstrated improved opacification of the right middle cerebral artery to where there was now opacification of the anterior temporal branch. Distal branch remains occluded with no change in anterior cerebral artery. A third pass was now made using an 027 150 cm Marksman microcatheter which was advanced again using the combination of the 071 Zoom aspiration catheter over a 0.014 inch standard Synchro micro guidewire. This combination was advanced into the now the inferior division with the microcatheter advanced into the M2 region of the inferior division. The micro guidewire was removed. Good aspiration obtained from the hub of the 027 microcatheter. A gentle control arteriogram performed through this now demonstrated free flow into the distal vasculature. The aspiration catheter was engaged into the occluded distal right middle cerebral artery at the origin of the inferior division. Aspiration was now at the hub of the aspiration catheter embedded in the  right middle cerebral artery clot and also at the hub of the Marksman catheter for approximately 2 minutes, with proximal flow arrest in the right internal carotid artery and constant aspiration at its hub. The Marksman catheter was then gently retrieved while maintaining aspiration through the Zoom aspiration catheter. The Zoom aspiration catheter remained loft to aspiration. This was then retrieved and removed. Again clots were seen in the aspirate. Control arteriogram performed following reversal of flow arrest in the right internal carotid artery now demonstrated complete revascularization of the right MCA distribution, into the distal distribution achieving a TICI 2c revascularization. The right anterior cerebral artery remained widely patent. Measurements were now performed of the right internal carotid artery proximally, and the right common carotid artery distally at the proposed landing zones for the revascularization stent. An 021 Trevo ProVue microcatheter was advanced to the petrous segment of the right internal carotid artery over a 0.014 inch standard Synchro micro guidewire, and replaced with an 014 inch 300 cm Synchro exchange micro guidewire. Safe position of the tip of the exchange micro guidewire was verified. It was elected to proceed  with placement of a 6/8 mm x 40 mm Xact stent across the diseased previously occluded right internal carotid artery proximally. This was retrogradely purged with heparinized saline infusion. Using the rapid exchange technique, the delivery system of the stent was then advanced and positioned covering distally, and also the proximal portion of the landing zones. Stent was deployed in the usual manner without any difficulty. The delivery system was removed. Good aspiration obtained from the hub of the balloon guide in the right common carotid artery. A control arteriogram performed through this demonstrated excellent positioning and apposition of the stent with now  free flow noted into the intracranial right ICA and the right middle and right anterior cerebral arteries being widely patent. Prior to this patient was loaded with 81 mg aspirin, and 180 mg of Brilinta via an orogastric tube. CT scan of the brain was then performed which demonstrated contrast blush of the right basal ganglia, and also to the 3 focal areas of hyperdensity suspicious for micro hemorrhages. 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 Three quarter bolus dose of Cangrelor was then given intravenously in order to salvage the occluding stent in the right internal carotid artery. The balloon guide was then removed following removal of the exchange micro guidewire. The diagnostic catheter was then positioned in the left common carotid artery. This demonstrated the left external carotid artery and the left internal carotid arteries to be widely patent. Patency of the left internal carotid to the cranial skull base was verified. Also the left middle and the left anterior cerebral arteries were seen to opacify into the capillary and venous phases. Also almost simultaneously cross-filling via the anterior communicating artery of the right anterior and the right middle cerebral arteries is noted via the anterior communicating artery. No gross filling defects were seen in the middle cerebral artery distribution. 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 The diagnostic catheter was removed. The 8 French Pinnacle sheath was then exchanged for a short 8 [place] sheath which in turn was then removed with the successful application of the 7 [place] ExoSeal closure device with hemostasis in the right groin.  Distal pulses remained palpable in the feet bilaterally unchanged. A second CT scan of the brain demonstrated no significant change in the right basal ganglia with continued presence of at least 3 foci of hemorrhage. No mass effect or midline shift was seen. Patient was left intubated on account of patient's medical condition and inability to respond appropriately to protect the airway. Patient was then transferred to the neuro ICU for post recanalization management. IMPRESSION: Status post endovascular complete revascularization of the right middle cerebral artery and the right anterior cerebral artery with 1 pass with a Solitaire 4 mm x 40 mm X retrieval device and aspiration, and 1 pass with the Tiger 21 retrieval device with proximal aspiration, and with 1 pass with contact aspiration achieving a TICI 2c revascularization of the right middle cerebral artery distribution. 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 Attempt to rescue with 3/4 bolus dose of Cangrelor IV. PLAN: Follow-up in the clinic 4 to 6 weeks post discharge. Electronically Signed   By: Willaim Rayas  Deveshwar M.D.   On: 06/30/2020 13:20   IR CT [facility]  Result Date: 07/02/2020 INDICATION: New onset right gaze deviation, left hemiplegia, and left-sided neglect. EXAM: 1. EMERGENT LARGE VESSEL OCCLUSION THROMBOLYSIS anterior CIRCULATION) COMPARISON:  CT angiogram of the head and neck of June 29, 2020. MEDICATIONS: Ancef 2 g IV was administered within 1 hour of the procedure. ANESTHESIA/SEDATION: General anesthesia CONTRAST:  Isovue 300 approximately 165 mL FLUOROSCOPY TIME:  Fluoroscopy Time: 97 minutes 0 seconds (3622 mGy). COMPLICATIONS: None immediate. TECHNIQUE: Following a full explanation of the procedure along with the potential associated complications, an informed witnessed  consent was obtained from the spouse. The risks of intracranial hemorrhage of 10%, worsening neurological deficit, ventilator dependency, death and inability to revascularize were all reviewed in detail with the patient's spouse. The patient was then put under general anesthesia by the Department of Anesthesiology at [facility]. The right groin was prepped and draped in the usual sterile fashion. Thereafter using modified Seldinger technique, transfemoral access into the right common femoral artery was obtained without difficulty. Over a 0.035 inch guidewire an 8 French 25 Pinnacle sheath was inserted. Through this, and also over a 0.035 inch guidewire a 5 French 125 cm Berenstein select inside of the balloon guide catheter combinationwas advanced to the aortic arch region and selectively positioned in the distal right common carotid artery. Guidewire, and the select catheter removed. Good aspiration obtained from the hub of the balloon guide catheter in the right common carotid artery. Arteriogram was then performed centered extra cranially and intracranially. FINDINGS: The right common carotid arteriogram demonstrates the right external carotid artery and its major branches to be widely patent. The right internal carotid artery at the bulb demonstrates a moderate sized plaque associated with complete occlusion just distal to the bulb. No evidence of distal angiographic string sign, or reconstitution of right internal carotid artery in the cavernous segment was noted. PROCEDURE: Through the balloon guide catheter, a combination of an 021 Trevo ProVue microcatheter over a 0.014 inch standard Synchro micro guidewire with a J configuration, access through the occluded right internal carotid artery was obtained with the micro guidewire leading followed by the microcatheter. The combination was advanced to the horizontal petrous segment where the micro guidewire was removed. Poor aspiration was obtained from  the microcatheter hub on account of the extensive clot in the entire right internal carotid artery intracranially and extra cranially. The microcatheter was then exchanged for a 014 inch Synchro standard 300 cm exchange micro guidewire with a J-tip configuration. A control arteriogram performed through the balloon guide demonstrated minimally improved caliber through the proximal right internal carotid artery. A 4 mm x 30 mm Viatrac 14 angioplasty balloon catheter which had been prepped with 50% contrast and 50% heparinized saline infusion and integrated with 75% contrast and 25% heparinized saline infusion was advanced over the exchange micro guidewire using the rapid exchange technique. The proximal and distal markers were then positioned in the described landing zones. A control inflation was then performed using micro inflation syringe device via micro tubing to 12 atmospheres. This was maintained for about 20 seconds and retrieved proximally. A control arteriogram performed through the balloon guide demonstrated improved caliber in the bulb but there continued be a high-grade stenosis in the proximal right internal carotid artery. This prompted a second angioplasty in the manner as described above again to 12 atmospheres for approximately 20 seconds. The balloon was then deflated and retrieved and removed. A control arteriogram performed through  the balloon guide demonstrated significantly improved caliber and flow through the angioplastied segment with improved flow to the more distal right internal carotid artery. However, there continued to be extensive clot noted within the internal carotid artery at the cranial skull base and the supraclinoid right ICA. An 021 Trevo ProVue catheter inside of a 071 134 cm aspiration catheter, combination was advanced over the exchange micro guidewire to the cavernous segment of the right internal carotid artery. Exchange micro guidewire was replaced with a regular 014 inch  standard Synchro micro guidewire with a J configuration. This was then advanced using a torque device through the occluded right middle cerebral artery into one of the inferior division branches in the M2 M3 region followed by the microcatheter. The guidewire was removed. Good aspiration obtained from the hub of the microcatheter. A gentle control arteriogram performed through this demonstrated slow flow through the vessel itself. A 4 mm x 40 mm Solitaire X retrieval device was then advanced to the distal end of the microcatheter. The retriever was deployed in the usual manner. The Zoom aspiration catheter was advanced to the mid right M1 segment. Continuous aspiration was then performed at the hub of the aspiration catheter for 2-1/2 minutes, with proximal flow arrest in the right internal carotid artery. The retrieval device, the microcatheter and the aspiration catheter were retrieved and removed. Copious amounts of clot were captured into the Tuohy [place], and also in the retrieval device. Following reversal of flow arrest, a control arteriogram performed through the balloon guide catheter demonstrated now improved flow through the proximal right internal carotid artery extra cranially and intracranially. There is now patency of the right anterior cerebral artery with a stump of the occluded right middle cerebral artery. A second pass was then made again using the above combination. The microcatheter was now accessed into the superior division of the right middle cerebral artery M2 M3 region over a 0.014 inch standard Synchro micro guidewire. Free aspiration of a blood was obtained at the hub of the microcatheter. A Tiger 21 retrieval device was then advanced to the distal end of the retrieval device. This was then deployed by retrieving the microcatheter. The proximal portion of the retrieval device was within the proximal aspect of the occluded right middle cerebral artery. Free aspiration was then started  through the aspiration catheter with the Penumbra aspiration device which was positioned in the distal portion of the occluded clot. The retrieval device was then opened and closed until there was kinking at the proximal portion. The microcatheter was then advanced to the proximal marker on the retrieval device. With constant aspiration continued, the Tiger 21 retrieval device was then removed as constant aspiration was applied at the hub of the aspiration catheter, and also with proximal flow arrest and aspiration at the hub of the balloon guide with a 60 mL syringe. Multiple fragments of clot were obtained within the [place], and also entangled in the retrieval device. With reversal of the proximal flow arrest, a control arteriogram performed through the balloon guide in the right internal carotid artery now demonstrated improved opacification of the right middle cerebral artery to where there was now opacification of the anterior temporal branch. Distal branch remains occluded with no change in anterior cerebral artery. A third pass was now made using an 027 150 cm Marksman microcatheter which was advanced again using the combination of the 071 Zoom aspiration catheter over a 0.014 inch standard Synchro micro guidewire. This combination was advanced into the  now the inferior division with the microcatheter advanced into the M2 region of the inferior division. The micro guidewire was removed. Good aspiration obtained from the hub of the 027 microcatheter. A gentle control arteriogram performed through this now demonstrated free flow into the distal vasculature. The aspiration catheter was engaged into the occluded distal right middle cerebral artery at the origin of the inferior division. Aspiration was now at the hub of the aspiration catheter embedded in the right middle cerebral artery clot and also at the hub of the Marksman catheter for approximately 2 minutes, with proximal flow arrest in the right  internal carotid artery and constant aspiration at its hub. The Marksman catheter was then gently retrieved while maintaining aspiration through the Zoom aspiration catheter. The Zoom aspiration catheter remained loft to aspiration. This was then retrieved and removed. Again clots were seen in the aspirate. Control arteriogram performed following reversal of flow arrest in the right internal carotid artery now demonstrated complete revascularization of the right MCA distribution, into the distal distribution achieving a TICI 2c revascularization. The right anterior cerebral artery remained widely patent. Measurements were now performed of the right internal carotid artery proximally, and the right common carotid artery distally at the proposed landing zones for the revascularization stent. An 021 Trevo ProVue microcatheter was advanced to the petrous segment of the right internal carotid artery over a 0.014 inch standard Synchro micro guidewire, and replaced with an 014 inch 300 cm Synchro exchange micro guidewire. Safe position of the tip of the exchange micro guidewire was verified. It was elected to proceed with placement of a 6/8 mm x 40 mm Xact stent across the diseased previously occluded right internal carotid artery proximally. This was retrogradely purged with heparinized saline infusion. Using the rapid exchange technique, the delivery system of the stent was then advanced and positioned covering distally, and also the proximal portion of the landing zones. Stent was deployed in the usual manner without any difficulty. The delivery system was removed. Good aspiration obtained from the hub of the balloon guide in the right common carotid artery. A control arteriogram performed through this demonstrated excellent positioning and apposition of the stent with now free flow noted into the intracranial right ICA and the right middle and right anterior cerebral arteries being widely patent. Prior to this patient  was loaded with 81 mg aspirin, and 180 mg of Brilinta via an orogastric tube. CT scan of the brain was then performed which demonstrated contrast blush of the right basal ganglia, and also to the 3 focal areas of hyperdensity suspicious for micro hemorrhages. 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 Three quarter bolus dose of Cangrelor was then given intravenously in order to salvage the occluding stent in the right internal carotid artery. The balloon guide was then removed following removal of the exchange micro guidewire. The diagnostic catheter was then positioned in the left common carotid artery. This demonstrated the left external carotid artery and the left internal carotid arteries to be widely patent. Patency of the left internal carotid to the cranial skull base was verified. Also the left middle and the left anterior cerebral arteries were seen to opacify into the capillary and venous phases. Also almost simultaneously cross-filling via the anterior communicating artery of the right anterior and the right middle cerebral arteries is noted via the anterior communicating  artery. No gross filling defects were seen in the middle cerebral artery distribution. 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 The diagnostic catheter was removed. The 8 French Pinnacle sheath was then exchanged for a short 8 [place] sheath which in turn was then removed with the successful application of the 7 [place] ExoSeal closure device with hemostasis in the right groin. Distal pulses remained palpable in the feet bilaterally unchanged. A second CT scan of the brain demonstrated no significant change in the right basal  ganglia with continued presence of at least 3 foci of hemorrhage. No mass effect or midline shift was seen. Patient was left intubated on account of patient's medical condition and inability to respond appropriately to protect the airway. Patient was then transferred to the neuro ICU for post recanalization management. IMPRESSION: Status post endovascular complete revascularization of the right middle cerebral artery and the right anterior cerebral artery with 1 pass with a Solitaire 4 mm x 40 mm X retrieval device and aspiration, and 1 pass with the Tiger 21 retrieval device with proximal aspiration, and with 1 pass with contact aspiration achieving a TICI 2c revascularization of the right middle cerebral artery distribution. 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 Attempt to rescue with 3/4 bolus dose of Cangrelor IV. PLAN: Follow-up in the clinic 4 to 6 weeks post discharge. Electronically Signed   By: Luanne Bras M.D.   On: 06/30/2020 13:20   IR CT Head [facility]  Result Date: 07/02/2020 INDICATION: New onset right gaze deviation, left hemiplegia, and left-sided neglect. EXAM: 1. EMERGENT LARGE VESSEL OCCLUSION THROMBOLYSIS anterior CIRCULATION) COMPARISON:  CT angiogram of the head and neck of June 29, 2020. MEDICATIONS: Ancef 2 g IV was administered within 1 hour of the procedure. ANESTHESIA/SEDATION: General anesthesia CONTRAST:  Isovue 300 approximately 165 mL FLUOROSCOPY TIME:  Fluoroscopy Time: 97 minutes 0 seconds (3622 mGy). COMPLICATIONS: None immediate. TECHNIQUE: Following a full explanation of the procedure along with the potential associated complications, an informed witnessed consent was obtained from the spouse. The risks of intracranial hemorrhage of 10%, worsening neurological deficit, ventilator dependency, death and  inability to revascularize were all reviewed in detail with the patient's spouse. The patient was then put under general anesthesia by the Department of Anesthesiology at [facility]. The right groin was prepped and draped in the usual sterile fashion. Thereafter using modified Seldinger technique, transfemoral access into the right common femoral artery was obtained without difficulty. Over a 0.035 inch guidewire an 8 French 25 Pinnacle sheath was inserted. Through this, and also over a 0.035 inch guidewire a 5 French 125 cm Berenstein select inside of the balloon guide catheter combinationwas advanced to the aortic arch region and selectively positioned in the distal right common carotid artery. Guidewire, and the select catheter removed. Good aspiration obtained from the hub of the balloon guide catheter in the right common carotid artery. Arteriogram was then performed centered extra cranially and intracranially. FINDINGS: The right common carotid arteriogram demonstrates the right external carotid artery and its major branches to be widely patent. The right internal carotid artery at the bulb demonstrates a moderate sized plaque associated with complete occlusion just distal to the bulb. No evidence of distal angiographic string sign, or reconstitution of right internal carotid artery in the cavernous segment was noted.  PROCEDURE: Through the balloon guide catheter, a combination of an 021 Trevo ProVue microcatheter over a 0.014 inch standard Synchro micro guidewire with a J configuration, access through the occluded right internal carotid artery was obtained with the micro guidewire leading followed by the microcatheter. The combination was advanced to the horizontal petrous segment where the micro guidewire was removed. Poor aspiration was obtained from the microcatheter hub on account of the extensive clot in the entire right internal carotid artery intracranially and extra cranially. The  microcatheter was then exchanged for a 014 inch Synchro standard 300 cm exchange micro guidewire with a J-tip configuration. A control arteriogram performed through the balloon guide demonstrated minimally improved caliber through the proximal right internal carotid artery. A 4 mm x 30 mm Viatrac 14 angioplasty balloon catheter which had been prepped with 50% contrast and 50% heparinized saline infusion and integrated with 75% contrast and 25% heparinized saline infusion was advanced over the exchange micro guidewire using the rapid exchange technique. The proximal and distal markers were then positioned in the described landing zones. A control inflation was then performed using micro inflation syringe device via micro tubing to 12 atmospheres. This was maintained for about 20 seconds and retrieved proximally. A control arteriogram performed through the balloon guide demonstrated improved caliber in the bulb but there continued be a high-grade stenosis in the proximal right internal carotid artery. This prompted a second angioplasty in the manner as described above again to 12 atmospheres for approximately 20 seconds. The balloon was then deflated and retrieved and removed. A control arteriogram performed through the balloon guide demonstrated significantly improved caliber and flow through the angioplastied segment with improved flow to the more distal right internal carotid artery. However, there continued to be extensive clot noted within the internal carotid artery at the cranial skull base and the supraclinoid right ICA. An 021 Trevo ProVue catheter inside of a 071 134 cm aspiration catheter, combination was advanced over the exchange micro guidewire to the cavernous segment of the right internal carotid artery. Exchange micro guidewire was replaced with a regular 014 inch standard Synchro micro guidewire with a J configuration. This was then advanced using a torque device through the occluded right middle  cerebral artery into one of the inferior division branches in the M2 M3 region followed by the microcatheter. The guidewire was removed. Good aspiration obtained from the hub of the microcatheter. A gentle control arteriogram performed through this demonstrated slow flow through the vessel itself. A 4 mm x 40 mm Solitaire X retrieval device was then advanced to the distal end of the microcatheter. The retriever was deployed in the usual manner. The Zoom aspiration catheter was advanced to the mid right M1 segment. Continuous aspiration was then performed at the hub of the aspiration catheter for 2-1/2 minutes, with proximal flow arrest in the right internal carotid artery. The retrieval device, the microcatheter and the aspiration catheter were retrieved and removed. Copious amounts of clot were captured into the Tu[place], and also in the retrieval device. Following reversal of flow arrest, a control arteriogram performed through the balloon guide catheter demonstrated now improved flow through the proximal right internal carotid artery extra cranially and intracranially. There is now patency of the right anterior cerebral artery with a stump of the occluded right middle cerebral artery. A second pass was then made again using the above combination. The microcatheter was now accessed into the superior division of the right middle cerebral artery M2 M3 region over a  0.014 inch standard Synchro micro guidewire. Free aspiration of a blood was obtained at the hub of the microcatheter. A Tiger 21 retrieval device was then advanced to the distal end of the retrieval device. This was then deployed by retrieving the microcatheter. The proximal portion of the retrieval device was within the proximal aspect of the occluded right middle cerebral artery. Free aspiration was then started through the aspiration catheter with the Penumbra aspiration device which was positioned in the distal portion of the occluded clot. The  retrieval device was then opened and closed until there was kinking at the proximal portion. The microcatheter was then advanced to the proximal marker on the retrieval device. With constant aspiration continued, the Tiger 21 retrieval device was then removed as constant aspiration was applied at the hub of the aspiration catheter, and also with proximal flow arrest and aspiration at the hub of the balloon guide with a 60 mL syringe. Multiple fragments of clot were obtained within the [place], and also entangled in the retrieval device. With reversal of the proximal flow arrest, a control arteriogram performed through the balloon guide in the right internal carotid artery now demonstrated improved opacification of the right middle cerebral artery to where there was now opacification of the anterior temporal branch. Distal branch remains occluded with no change in anterior cerebral artery. A third pass was now made using an 027 150 cm Marksman microcatheter which was advanced again using the combination of the 071 Zoom aspiration catheter over a 0.014 inch standard Synchro micro guidewire. This combination was advanced into the now the inferior division with the microcatheter advanced into the M2 region of the inferior division. The micro guidewire was removed. Good aspiration obtained from the hub of the 027 microcatheter. A gentle control arteriogram performed through this now demonstrated free flow into the distal vasculature. The aspiration catheter was engaged into the occluded distal right middle cerebral artery at the origin of the inferior division. Aspiration was now at the hub of the aspiration catheter embedded in the right middle cerebral artery clot and also at the hub of the Marksman catheter for approximately 2 minutes, with proximal flow arrest in the right internal carotid artery and constant aspiration at its hub. The Marksman catheter was then gently retrieved while maintaining aspiration  through the Zoom aspiration catheter. The Zoom aspiration catheter remained loft to aspiration. This was then retrieved and removed. Again clots were seen in the aspirate. Control arteriogram performed following reversal of flow arrest in the right internal carotid artery now demonstrated complete revascularization of the right MCA distribution, into the distal distribution achieving a TICI 2c revascularization. The right anterior cerebral artery remained widely patent. Measurements were now performed of the right internal carotid artery proximally, and the right common carotid artery distally at the proposed landing zones for the revascularization stent. An 021 Trevo ProVue microcatheter was advanced to the petrous segment of the right internal carotid artery over a 0.014 inch standard Synchro micro guidewire, and replaced with an 014 inch 300 cm Synchro exchange micro guidewire. Safe position of the tip of the exchange micro guidewire was verified. It was elected to proceed with placement of a 6/8 mm x 40 mm Xact stent across the diseased previously occluded right internal carotid artery proximally. This was retrogradely purged with heparinized saline infusion. Using the rapid exchange technique, the delivery system of the stent was then advanced and positioned covering distally, and also the proximal portion of the landing zones. Stent was  deployed in the usual manner without any difficulty. The delivery system was removed. Good aspiration obtained from the hub of the balloon guide in the right common carotid artery. A control arteriogram performed through this demonstrated excellent positioning and apposition of the stent with now free flow noted into the intracranial right ICA and the right middle and right anterior cerebral arteries being widely patent. Prior to this patient was loaded with 81 mg aspirin, and 180 mg of Brilinta via an orogastric tube. CT scan of the brain was then performed which demonstrated  contrast blush of the right basal ganglia, and also to the 3 focal areas of hyperdensity suspicious for micro hemorrhages. 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 Three quarter bolus dose of Cangrelor was then given intravenously in order to salvage the occluding stent in the right internal carotid artery. The balloon guide was then removed following removal of the exchange micro guidewire. The diagnostic catheter was then positioned in the left common carotid artery. This demonstrated the left external carotid artery and the left internal carotid arteries to be widely patent. Patency of the left internal carotid to the cranial skull base was verified. Also the left middle and the left anterior cerebral arteries were seen to opacify into the capillary and venous phases. Also almost simultaneously cross-filling via the anterior communicating artery of the right anterior and the right middle cerebral arteries is noted via the anterior communicating artery. No gross filling defects were seen in the middle cerebral artery distribution. 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 The diagnostic catheter was removed. The 8 French Pinnacle sheath was then exchanged for a short 8 [place] sheath which in turn was then removed with the successful application of the 7 [place] ExoSeal closure device with hemostasis in the right groin. Distal pulses remained palpable in the feet bilaterally unchanged. A second CT scan of the brain demonstrated no significant change in the right basal ganglia with continued presence of at least 3 foci of hemorrhage. No mass effect or midline shift was seen. Patient was left intubated on  account of patient's medical condition and inability to respond appropriately to protect the airway. Patient was then transferred to the neuro ICU for post recanalization management. IMPRESSION: Status post endovascular complete revascularization of the right middle cerebral artery and the right anterior cerebral artery with 1 pass with a Solitaire 4 mm x 40 mm X retrieval device and aspiration, and 1 pass with the Tiger 21 retrieval device with proximal aspiration, and with 1 pass with contact aspiration achieving a TICI 2c revascularization of the right middle cerebral artery distribution. 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 Attempt to rescue with 3/4 bolus dose of Cangrelor IV. PLAN: Follow-up in the clinic 4 to 6 weeks post discharge. Electronically Signed   By: Luanne Bras M.D.   On: 06/30/2020 13:20   CT Code Stroke Cerebral Perfusion with contrast  Result Date: 06/29/2020 CLINICAL DATA:  Hyperdense vessel and stroke-like symptoms EXAM: CT ANGIOGRAPHY HEAD AND NECK CT PERFUSION BRAIN TECHNIQUE: Multidetector CT imaging of the head and neck was performed using the standard protocol during bolus administration of intravenous contrast.  Multiplanar CT image reconstructions and MIPs were obtained to evaluate the vascular anatomy. Carotid stenosis measurements (when applicable) are obtained utilizing NASCET criteria, using the distal internal carotid diameter as the denominator. Multiphase CT imaging of the brain was performed following IV bolus contrast injection. Subsequent parametric perfusion maps were calculated using RAPID software. CONTRAST:  Dose is not yet known COMPARISON:  None. FINDINGS: CTA NECK FINDINGS Aortic arch: Mild atheromatous changes. Three vessel branching. No acute finding. Right carotid system: Atherosclerotic  plaque about the bifurcation with occluded ICA bulb. No flow seen within the ICA in the neck. Left carotid system: Atheromatous plaque at the bifurcation and bulb. No stenosis or ulceration. Negative for beading. Vertebral arteries: Low-density plaque at the left subclavian origin. No flow limiting subclavian stenosis. Plaque at the left vertebral origin without stenosis, beading, or dissection. Skeleton: Focal advanced C5-6 disc degeneration with ridging causing biforaminal impingement. Ordinary facet osteoarthritis. Other neck: No acute or aggressive finding. Right-sided chronic sinusitis with pattern middle meatus obstruction. Upper chest: Granulomatous nodal calcifications. There is also a calcified granuloma appearance in the left upper lobe. Review of the MIP images confirms the above findings CTA HEAD FINDINGS Anterior circulation: No flow seen in the right carotid or MCA branches. There is distal M2 M3 branch reconstitution. Calcified plaque along the left carotid siphon. No MCA branch occlusion, aneurysm, or beading. Posterior circulation: The vertebral and basilar arteries are smooth and widely patent. High-grade atheromatous narrowings of the bilateral PCA, P3 segment on the right and upper first order branch on the left. Venous sinuses: Patent as permitted by contrast timing Anatomic variants: None significant Review of the MIP images confirms the above findings CT Brain Perfusion Findings: ASPECTS: 9 CBF (<30%) Volume: 69mL Perfusion (Tmax>6.0s) volume: 182mL Mismatch Volume: 162mL Infarction Location:Right basal ganglia and lesser extensive lateral frontal cortex infarct. Critical Value/emergent results were called by telephone at the time of interpretation on 06/29/2020 at 9:37 am to provider [facility] , who verbally acknowledged these results. IMPRESSION: 1. Emergent large vessel occlusion with no flow seen in the right internal carotid or proximal MCA. There is a 36 cc core infarct and 142 cc of  penumbra by CT perfusion. 2. Atherosclerosis without flow limiting stenosis of the other major vessels. 3. Bilateral high-grade atheromatous PCA narrowings, more proximal on the right. 4. 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 Electronically Signed   By: Monte Fantasia M.D.   On: 06/29/2020 09:53   Portable Chest xray  Result Date: 06/30/2020 CLINICAL DATA:  Respiratory failure.  Stroke. EXAM: PORTABLE CHEST 1 VIEW COMPARISON:  06/29/2020 FINDINGS: 0538 hours. Endotracheal tube tip is 7.4 cm above the base of the carina. The lungs are clear without focal pneumonia, edema, pneumothorax or pleural effusion. The cardiopericardial silhouette is within normal limits for size. The visualized bony structures of the thorax show no acute abnormality. Telemetry leads overlie the chest. IMPRESSION: No acute cardiopulmonary findings. Electronically Signed   By: Misty Stanley M.D.   On: 06/30/2020 07:30   DG CHEST PORT 1 VIEW  Result Date: 06/29/2020 CLINICAL DATA:  Code stroke with coiling intubated EXAM: PORTABLE CHEST 1 VIEW COMPARISON:  None. FINDINGS: Endotracheal tube tip is about 4.2 cm superior to the carina. Esophageal tube tip is below the diaphragm but incompletely visualized. No focal opacity or pleural effusion. Normal cardiac size. No pneumothorax. IMPRESSION: Endotracheal tube tip about 4.2 cm superior to the carina. Lungs grossly clear Electronically Signed   By: Madie Reno.D.  On: 06/29/2020 16:00   DG Swallowing Func-Speech Pathology  Result Date: 07/01/2020 Objective Swallowing Evaluation: Type of Study: Bedside Swallow Evaluation  Patient Details Name: Tyler Richards MRN: [id number] Date of Birth: [birth year] Today's Date: 07/01/2020 Time: SLP Start Time (ACUTE ONLY): 1252 -SLP Stop Time (ACUTE ONLY): 1310 SLP Time Calculation (min) (ACUTE ONLY): 18 min Past Medical History: No past medical history on file. Past Surgical History: Past Surgical History: Procedure  Laterality Date . RADIOLOGY WITH ANESTHESIA N/A 06/29/2020  Procedure: IR WITH ANESTHESIA;  Surgeon: Radiologist, Medication, MD;  Location: [place];  Service: Radiology;  Laterality: N/A; HPI: Patient is a 69 y/o male who presents with left sided weakness, right gaze and slurred speech. NIH:17. Head CT- dense Right MCA infarct. CTA- Right ICA and MCA occlusions. s/p right MCA thrombectomy and right ICA stent placement 06/29/20 with post procedure hemmorhage. ETT 10/4-10/5. No PMH.  Subjective: alert, cooperative Assessment / Plan / Recommendation CHL IP CLINICAL IMPRESSIONS 07/01/2020 Clinical Impression Pt has an oral more than pharyngeal dysphagia, with oral preparation and transit impacted by reduced strength and suspect sensation as well. He has intermittent trouble getting liquids via cup or straw, with reduced seal and sucking. There is anterior spillage with thin liquids; L buccal pocketing with more solid consistencies and slower posterior transit. Mild premature spillage occurs with thin liquids. He has relatively improved pharyngeal function with only mildly reduced base of tongue retraction that results in trace-mild vallecular residue with purees and mild residue with solids. Recommend starting Dys 1 diet and thin liquids. Will f/u for advancement of solids clinically.  SLP Visit Diagnosis Dysphagia, oropharyngeal phase (R13.12) Attention and concentration deficit following -- Frontal lobe and executive function deficit following -- Impact on safety and function Mild aspiration risk   CHL IP TREATMENT RECOMMENDATION 07/01/2020 Treatment Recommendations Therapy as outlined in treatment plan below   Prognosis 07/01/2020 Prognosis for Safe Diet Advancement Good Barriers to Reach Goals -- Barriers/Prognosis Comment -- CHL IP DIET RECOMMENDATION 07/01/2020 SLP Diet Recommendations Dysphagia 1 (Puree) solids;Thin liquid Liquid Administration via Straw;Cup Medication Administration Crushed with puree Compensations  Minimize environmental distractions;Slow rate;Small sips/bites;Lingual sweep for clearance of pocketing Postural Changes Seated upright at 90 degrees   CHL IP OTHER RECOMMENDATIONS 07/01/2020 Recommended Consults -- Oral Care Recommendations Oral care before and after PO Other Recommendations Have oral suction available   CHL IP FOLLOW UP RECOMMENDATIONS 07/01/2020 Follow up Recommendations Inpatient Rehab   CHL IP FREQUENCY AND DURATION 07/01/2020 Speech Therapy Frequency (ACUTE ONLY) min 2x/week Treatment Duration 2 weeks      CHL IP ORAL PHASE 07/01/2020 Oral Phase Impaired Oral - Pudding Teaspoon -- Oral - Pudding Cup -- Oral - Honey Teaspoon -- Oral - Honey Cup -- Oral - Nectar Teaspoon -- Oral - Nectar Cup -- Oral - Nectar Straw -- Oral - Thin Teaspoon -- Oral - Thin Cup Left anterior bolus loss;Weak lingual manipulation;Decreased bolus cohesion;Premature spillage Oral - Thin Straw Weak lingual manipulation;Decreased bolus cohesion;Premature spillage Oral - Puree Weak lingual manipulation;Decreased bolus cohesion;Premature spillage Oral - Mech Soft Weak lingual manipulation;Decreased bolus cohesion;Premature spillage;Left pocketing in lateral sulci Oral - Regular -- Oral - Multi-Consistency -- Oral - Pill -- Oral Phase - Comment --  CHL IP PHARYNGEAL PHASE 07/01/2020 Pharyngeal Phase Impaired Pharyngeal- Pudding Teaspoon -- Pharyngeal -- Pharyngeal- Pudding Cup -- Pharyngeal -- Pharyngeal- Honey Teaspoon -- Pharyngeal -- Pharyngeal- Honey Cup -- Pharyngeal -- Pharyngeal- Nectar Teaspoon -- Pharyngeal -- Pharyngeal- Nectar Cup -- Pharyngeal -- Pharyngeal- Nectar Straw --  Pharyngeal -- Pharyngeal- Thin Teaspoon -- Pharyngeal -- Pharyngeal- Thin Cup Delayed swallow initiation-pyriform sinuses;Reduced tongue base retraction Pharyngeal -- Pharyngeal- Thin Straw Delayed swallow initiation-pyriform sinuses;Reduced tongue base retraction Pharyngeal -- Pharyngeal- Puree Reduced tongue base retraction;Pharyngeal residue -  valleculae Pharyngeal -- Pharyngeal- Mechanical Soft Reduced tongue base retraction;Pharyngeal residue - valleculae Pharyngeal -- Pharyngeal- Regular -- Pharyngeal -- Pharyngeal- Multi-consistency -- Pharyngeal -- Pharyngeal- Pill -- Pharyngeal -- Pharyngeal Comment --  CHL IP CERVICAL ESOPHAGEAL PHASE 07/01/2020 Cervical Esophageal Phase WFL Pudding Teaspoon -- Pudding Cup -- Honey Teaspoon -- Honey Cup -- Nectar Teaspoon -- Nectar Cup -- Nectar Straw -- Thin Teaspoon -- Thin Cup -- Thin Straw -- Puree -- Mechanical Soft -- Regular -- Multi-consistency -- Pill -- Cervical Esophageal Comment -- [name], M.A. [facility] Pager [phone] Office [phone] 07/01/2020, 2:21 PM              EEG adult  Result Date: 07/02/2020 Lora Havens, MD     07/02/2020  6:23 PM Patient Name: Tyler Richards MRN: [id number] Epilepsy Attending: Lora Havens Referring Physician/Provider: Dr Rosalin Hawking Date: 07/02/2020 Duration: 24.26 mins Patient history: 69 y.o. male with history of gout admitted for left-sided weakness, right gaze, left facial droop with fall. MRI showed acute infarct at the right basal ganglia with nonprogressive hemorrhage as well as less extensive patchy acute cortical infarct in the right MCA territory EEG to evaluate for seizure. Level of alertness: Awake, asleep AEDs during EEG study: None Technical aspects: This EEG study was done with scalp electrodes positioned according to the 10-20 International system of electrode placement. Electrical activity was acquired at a sampling rate of 500Hz  and reviewed with a high frequency filter of 70Hz  and a low frequency filter of 1Hz . EEG data were recorded continuously and digitally stored. Description: The posterior dominant rhythm consists of 8 Hz activity of moderate voltage (25-35 uV) seen predominantly in posterior head regions, asymmetric ( R<L) and reactive to eye opening and eye closing. Sleep was characterized by vertex  waves, sleep spindles (12 to 14 Hz), maximal frontocentral region.  EEG showed continuous 3 to 5 Hz theta-delta slowing in right hemisphere.  Hyperventilation and photic stimulation were not performed.   ABNORMALITY -Continuous slow, right hemisphere - Background asymmetry, right<left IMPRESSION: This study is suggestive of cortical dysfunction in right hemisphere consistent with underlying stroke. No seizures or epileptiform discharges were seen throughout the recording. Lora Havens   ECHOCARDIOGRAM COMPLETE  Result Date: 06/30/2020    ECHOCARDIOGRAM REPORT   Patient Name:   Tyler Richards Date of Exam: 06/30/2020 Medical Rec #:  [id number]     Height:       74.0 in Accession #:    [id number]    Weight:       187.6 lb Date of Birth:  Jun 23, 1951     BSA:          2.115 m Patient Age:    15 years      BP:           162/65 mmHg Patient Gender: M             HR:           52 bpm. Exam Location:  Inpatient Procedure: 2D Echo, Cardiac Doppler, Color Doppler and Intracardiac            Opacification Agent Indications:    CVA  History:        Patient has no prior  history of Echocardiogram examinations.                 Stroke and COPD, Signs/Symptoms:Resp. failure; Risk                 Factors:Hypertension.  Sonographer:    Dustin Flock Referring Phys: [phone] ERIC LINDZEN  Sonographer Comments: Technically difficult study due to poor echo windows and echo performed with patient supine and on artificial respirator. Image acquisition challenging due to COPD and Image acquisition challenging due to respiratory motion. IMPRESSIONS  1. Left ventricular ejection fraction, by estimation, is 50 to 55%. The left ventricle has low normal function. The left ventricle has no regional wall motion abnormalities. Left ventricular diastolic parameters are consistent with Grade I diastolic dysfunction (impaired relaxation).  2. Right ventricular systolic function is normal. The right ventricular size is normal. There is mildly  elevated pulmonary artery systolic pressure.  3. Left atrial size was mildly dilated.  4. The mitral valve is normal in structure. No evidence of mitral valve regurgitation. No evidence of mitral stenosis.  5. The aortic valve is normal in structure. Aortic valve regurgitation is not visualized. No aortic stenosis is present.  6. The inferior vena cava is dilated in size with <50% respiratory variability, suggesting right atrial pressure of 15 mmHg. FINDINGS  Left Ventricle: Left ventricular ejection fraction, by estimation, is 50 to 55%. The left ventricle has low normal function. The left ventricle has no regional wall motion abnormalities. Definity contrast agent was given IV to delineate the left ventricular endocardial borders. The left ventricular internal cavity size was normal in size. There is no left ventricular hypertrophy. Left ventricular diastolic parameters are consistent with Grade I diastolic dysfunction (impaired relaxation). Indeterminate filling pressures. Right Ventricle: The right ventricular size is normal. No increase in right ventricular wall thickness. Right ventricular systolic function is normal. There is mildly elevated pulmonary artery systolic pressure. The tricuspid regurgitant velocity is 2.43  m/s, and with an assumed right atrial pressure of 15 mmHg, the estimated right ventricular systolic pressure is 62.1 mmHg. Left Atrium: Left atrial size was mildly dilated. Right Atrium: Right atrial size was normal in size. Pericardium: There is no evidence of pericardial effusion. Mitral Valve: The mitral valve is normal in structure. No evidence of mitral valve regurgitation. No evidence of mitral valve stenosis. Tricuspid Valve: The tricuspid valve is normal in structure. Tricuspid valve regurgitation is trivial. No evidence of tricuspid stenosis. Aortic Valve: The aortic valve is normal in structure. Aortic valve regurgitation is not visualized. No aortic stenosis is present. Pulmonic  Valve: The pulmonic valve was normal in structure. Pulmonic valve regurgitation is not visualized. No evidence of pulmonic stenosis. Aorta: The aortic root is normal in size and structure. Venous: The inferior vena cava is dilated in size with less than 50% respiratory variability, suggesting right atrial pressure of 15 mmHg. IAS/Shunts: No atrial level shunt detected by color flow Doppler.  LEFT VENTRICLE PLAX 2D LVIDd:         5.30 cm  Diastology LVIDs:         3.80 cm  LV e' medial:    6.85 cm/s LV PW:         1.30 cm  LV E/e' medial:  10.2 LV IVS:        1.00 cm  LV e' lateral:   12.10 cm/s LVOT diam:     2.10 cm  LV E/e' lateral: 5.8 LV SV:  106 LV SV Index:   50 LVOT Area:     3.46 cm  RIGHT VENTRICLE RV Basal diam:  3.80 cm RV S prime:     7.07 cm/s TAPSE (M-mode): 3.3 cm LEFT ATRIUM           Index       RIGHT ATRIUM           Index LA diam:      4.30 cm 2.03 cm/m  RA Area:     21.00 cm LA Vol (A4C): 45.5 ml 21.51 ml/m RA Volume:   67.30 ml  31.82 ml/m  AORTIC VALVE LVOT Vmax:   150.00 cm/s LVOT Vmean:  90.400 cm/s LVOT VTI:    0.305 m  AORTA Ao Root diam: 3.50 cm MITRAL VALVE               TRICUSPID VALVE MV Area (PHT): 2.80 cm    TR Peak grad:   23.6 mmHg MV Decel Time: 271 msec    TR Vmax:        243.00 cm/s MV E velocity: 69.80 cm/s MV A velocity: 68.60 cm/s  SHUNTS MV E/A ratio:  1.02        Systemic VTI:  0.30 m                            Systemic Diam: 2.10 cm Skeet Latch MD Electronically signed by Skeet Latch MD Signature Date/Time: 06/30/2020/11:25:18 AM    Final    IR PERCUTANEOUS ART THROMBECTOMY/INFUSION INTRACRANIAL INC DIAG ANGIO  Result Date: 07/02/2020 INDICATION: New onset right gaze deviation, left hemiplegia, and left-sided neglect. EXAM: 1. EMERGENT LARGE VESSEL OCCLUSION THROMBOLYSIS anterior CIRCULATION) COMPARISON:  CT angiogram of the head and neck of June 29, 2020. MEDICATIONS: Ancef 2 g IV was administered within 1 hour of the procedure.  ANESTHESIA/SEDATION: General anesthesia CONTRAST:  Isovue 300 approximately 165 mL FLUOROSCOPY TIME:  Fluoroscopy Time: 97 minutes 0 seconds (3622 mGy). COMPLICATIONS: None immediate. TECHNIQUE: Following a full explanation of the procedure along with the potential associated complications, an informed witnessed consent was obtained from the spouse. The risks of intracranial hemorrhage of 10%, worsening neurological deficit, ventilator dependency, death and inability to revascularize were all reviewed in detail with the patient's spouse. The patient was then put under general anesthesia by the Department of Anesthesiology at [facility]. The right groin was prepped and draped in the usual sterile fashion. Thereafter using modified Seldinger technique, transfemoral access into the right common femoral artery was obtained without difficulty. Over a 0.035 inch guidewire an 8 French 25 Pinnacle sheath was inserted. Through this, and also over a 0.035 inch guidewire a 5 French 125 cm Berenstein select inside of the balloon guide catheter combinationwas advanced to the aortic arch region and selectively positioned in the distal right common carotid artery. Guidewire, and the select catheter removed. Good aspiration obtained from the hub of the balloon guide catheter in the right common carotid artery. Arteriogram was then performed centered extra cranially and intracranially. FINDINGS: The right common carotid arteriogram demonstrates the right external carotid artery and its major branches to be widely patent. The right internal carotid artery at the bulb demonstrates a moderate sized plaque associated with complete occlusion just distal to the bulb. No evidence of distal angiographic string sign, or reconstitution of right internal carotid artery in the cavernous segment was noted. PROCEDURE: Through the balloon guide catheter, a combination of an 021  Trevo ProVue microcatheter over a 0.014 inch standard Synchro  micro guidewire with a J configuration, access through the occluded right internal carotid artery was obtained with the micro guidewire leading followed by the microcatheter. The combination was advanced to the horizontal petrous segment where the micro guidewire was removed. Poor aspiration was obtained from the microcatheter hub on account of the extensive clot in the entire right internal carotid artery intracranially and extra cranially. The microcatheter was then exchanged for a 014 inch Synchro standard 300 cm exchange micro guidewire with a J-tip configuration. A control arteriogram performed through the balloon guide demonstrated minimally improved caliber through the proximal right internal carotid artery. A 4 mm x 30 mm Viatrac 14 angioplasty balloon catheter which had been prepped with 50% contrast and 50% heparinized saline infusion and integrated with 75% contrast and 25% heparinized saline infusion was advanced over the exchange micro guidewire using the rapid exchange technique. The proximal and distal markers were then positioned in the described landing zones. A control inflation was then performed using micro inflation syringe device via micro tubing to 12 atmospheres. This was maintained for about 20 seconds and retrieved proximally. A control arteriogram performed through the balloon guide demonstrated improved caliber in the bulb but there continued be a high-grade stenosis in the proximal right internal carotid artery. This prompted a second angioplasty in the manner as described above again to 12 atmospheres for approximately 20 seconds. The balloon was then deflated and retrieved and removed. A control arteriogram performed through the balloon guide demonstrated significantly improved caliber and flow through the angioplastied segment with improved flow to the more distal right internal carotid artery. However, there continued to be extensive clot noted within the internal carotid artery at  the cranial skull base and the supraclinoid right ICA. An 021 Trevo ProVue catheter inside of a 071 134 cm aspiration catheter, combination was advanced over the exchange micro guidewire to the cavernous segment of the right internal carotid artery. Exchange micro guidewire was replaced with a regular 014 inch standard Synchro micro guidewire with a J configuration. This was then advanced using a torque device through the occluded right middle cerebral artery into one of the inferior division branches in the M2 M3 region followed by the microcatheter. The guidewire was removed. Good aspiration obtained from the hub of the microcatheter. A gentle control arteriogram performed through this demonstrated slow flow through the vessel itself. A 4 mm x 40 mm Solitaire X retrieval device was then advanced to the distal end of the microcatheter. The retriever was deployed in the usual manner. The Zoom aspiration catheter was advanced to the mid right M1 segment. Continuous aspiration was then performed at the hub of the aspiration catheter for 2-1/2 minutes, with proximal flow arrest in the right internal carotid artery. The retrieval device, the microcatheter and the aspiration catheter were retrieved and removed. Copious amounts of clot were captured into the Tuohy Soso, and also in the retrieval device. Following reversal of flow arrest, a control arteriogram performed through the balloon guide catheter demonstrated now improved flow through the proximal right internal carotid artery extra cranially and intracranially. There is now patency of the right anterior cerebral artery with a stump of the occluded right middle cerebral artery. A second pass was then made again using the above combination. The microcatheter was now accessed into the superior division of the right middle cerebral artery M2 M3 region over a 0.014 inch standard Synchro micro guidewire. Free aspiration of a blood  was obtained at the hub of the  microcatheter. A Tiger 21 retrieval device was then advanced to the distal end of the retrieval device. This was then deployed by retrieving the microcatheter. The proximal portion of the retrieval device was within the proximal aspect of the occluded right middle cerebral artery. Free aspiration was then started through the aspiration catheter with the Penumbra aspiration device which was positioned in the distal portion of the occluded clot. The retrieval device was then opened and closed until there was kinking at the proximal portion. The microcatheter was then advanced to the proximal marker on the retrieval device. With constant aspiration continued, the Tiger 21 retrieval device was then removed as constant aspiration was applied at the hub of the aspiration catheter, and also with proximal flow arrest and aspiration at the hub of the balloon guide with a 60 mL syringe. Multiple fragments of clot were obtained within the [place], and also entangled in the retrieval device. With reversal of the proximal flow arrest, a control arteriogram performed through the balloon guide in the right internal carotid artery now demonstrated improved opacification of the right middle cerebral artery to where there was now opacification of the anterior temporal branch. Distal branch remains occluded with no change in anterior cerebral artery. A third pass was now made using an 027 150 cm Marksman microcatheter which was advanced again using the combination of the 071 Zoom aspiration catheter over a 0.014 inch standard Synchro micro guidewire. This combination was advanced into the now the inferior division with the microcatheter advanced into the M2 region of the inferior division. The micro guidewire was removed. Good aspiration obtained from the hub of the 027 microcatheter. A gentle control arteriogram performed through this now demonstrated free flow into the distal vasculature. The aspiration catheter was engaged into  the occluded distal right middle cerebral artery at the origin of the inferior division. Aspiration was now at the hub of the aspiration catheter embedded in the right middle cerebral artery clot and also at the hub of the Marksman catheter for approximately 2 minutes, with proximal flow arrest in the right internal carotid artery and constant aspiration at its hub. The Marksman catheter was then gently retrieved while maintaining aspiration through the Zoom aspiration catheter. The Zoom aspiration catheter remained loft to aspiration. This was then retrieved and removed. Again clots were seen in the aspirate. Control arteriogram performed following reversal of flow arrest in the right internal carotid artery now demonstrated complete revascularization of the right MCA distribution, into the distal distribution achieving a TICI 2c revascularization. The right anterior cerebral artery remained widely patent. Measurements were now performed of the right internal carotid artery proximally, and the right common carotid artery distally at the proposed landing zones for the revascularization stent. An 021 Trevo ProVue microcatheter was advanced to the petrous segment of the right internal carotid artery over a 0.014 inch standard Synchro micro guidewire, and replaced with an 014 inch 300 cm Synchro exchange micro guidewire. Safe position of the tip of the exchange micro guidewire was verified. It was elected to proceed with placement of a 6/8 mm x 40 mm Xact stent across the diseased previously occluded right internal carotid artery proximally. This was retrogradely purged with heparinized saline infusion. Using the rapid exchange technique, the delivery system of the stent was then advanced and positioned covering distally, and also the proximal portion of the landing zones. Stent was deployed in the usual manner without any difficulty. The delivery system  was removed. Good aspiration obtained from the hub of the balloon  guide in the right common carotid artery. A control arteriogram performed through this demonstrated excellent positioning and apposition of the stent with now free flow noted into the intracranial right ICA and the right middle and right anterior cerebral arteries being widely patent. Prior to this patient was loaded with 81 mg aspirin, and 180 mg of Brilinta via an orogastric tube. CT scan of the brain was then performed which demonstrated contrast blush of the right basal ganglia, and also to the 3 focal areas of hyperdensity suspicious for micro hemorrhages. 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 Three quarter bolus dose of Cangrelor was then given intravenously in order to salvage the occluding stent in the right internal carotid artery. The balloon guide was then removed following removal of the exchange micro guidewire. The diagnostic catheter was then positioned in the left common carotid artery. This demonstrated the left external carotid artery and the left internal carotid arteries to be widely patent. Patency of the left internal carotid to the cranial skull base was verified. Also the left middle and the left anterior cerebral arteries were seen to opacify into the capillary and venous phases. Also almost simultaneously cross-filling via the anterior communicating artery of the right anterior and the right middle cerebral arteries is noted via the anterior communicating artery. No gross filling defects were seen in the middle cerebral artery distribution. 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 The diagnostic catheter was removed. The 8 French Pinnacle sheath was then exchanged for a  short 8 [place] sheath which in turn was then removed with the successful application of the 7 [place] ExoSeal closure device with hemostasis in the right groin. Distal pulses remained palpable in the feet bilaterally unchanged. A second CT scan of the brain demonstrated no significant change in the right basal ganglia with continued presence of at least 3 foci of hemorrhage. No mass effect or midline shift was seen. Patient was left intubated on account of patient's medical condition and inability to respond appropriately to protect the airway. Patient was then transferred to the neuro ICU for post recanalization management. IMPRESSION: Status post endovascular complete revascularization of the right middle cerebral artery and the right anterior cerebral artery with 1 pass with a Solitaire 4 mm x 40 mm X retrieval device and aspiration, and 1 pass with the Tiger 21 retrieval device with proximal aspiration, and with 1 pass with contact aspiration achieving a TICI 2c revascularization of the right middle cerebral artery distribution. 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 Attempt to rescue with 3/4 bolus dose of Cangrelor IV. PLAN: Follow-up in the clinic 4 to 6 weeks post discharge. Electronically Signed   By: Luanne Bras M.D.   On: 06/30/2020 13:20   CT HEAD CODE STROKE WO CONTRAST  Result Date: 06/29/2020 CLINICAL DATA:  Code stroke.  Slurred speech and left facial droop EXAM: CT HEAD WITHOUT CONTRAST TECHNIQUE: Contiguous axial images were obtained from the base of the skull through the vertex without intravenous contrast. COMPARISON:  None. FINDINGS: Brain: Small remote appearing cortically based infarcts in the superior right  frontal lobe. No hemorrhage, hydrocephalus, or masslike finding. Vascular: Hyperdense distal right ICA to MCA  branches. Atherosclerotic calcification. Skull: Negative Sinuses/Orbits: Right maxillary, ethmoid, and frontal sinus opacification with sclerotic wall thickening at the maxillary sinus. Other: Critical Value/emergent results were called by telephone at the time of interpretation on 06/29/2020 at 9:23 am to provider Lindzen , who verbally acknowledged these results. ASPECTS [facility] Stroke Program Early CT Score) - Ganglionic level infarction (caudate, lentiform nuclei, internal capsule, insula, M1-M3 cortex): Posterior putamen appears blunted compared to the left. Equivocal for small insular cortex infarct. - Supraganglionic infarction (M4-M6 cortex): 3, when accounting for chronic infarct Total score (0-10 with 10 being normal): 9, when accounting for chronic infarct IMPRESSION: 1. Hyperdense distal right ICA and proximal MCA. 2. Blunted appearance of the posterior right putamen. Chronic right high frontal cortex infarcts. ASPECTS is 9 when excluding the chronic changes. Electronically Signed   By: Monte Fantasia M.D.   On: 06/29/2020 09:25   VAS US CAROTID  Result Date: 07/02/2020 Carotid Arterial Duplex Study Indications:   CVA and Right stent. Other Factors: Rt ica stent- 06/29/20. Performing Technologist: [name] RVS  Examination Guidelines: A complete evaluation includes B-mode imaging, spectral Doppler, color Doppler, and power Doppler as needed of all accessible portions of each vessel. Bilateral testing is considered an integral part of a complete examination. Limited examinations for reoccurring indications may be performed as noted.  Right Carotid Findings: +----------+--------+--------+--------+------------------+--------+           PSV cm/sEDV cm/sStenosisPlaque DescriptionComments +----------+--------+--------+--------+------------------+--------+ CCA Prox  101                     heterogenous               +----------+--------+--------+--------+------------------+--------+ CCA  Distal57                      heterogenous               +----------+--------+--------+--------+------------------+--------+ ECA       103     6                                          +----------+--------+--------+--------+------------------+--------+ +----------+--------+-------+--------+-------------------+           PSV cm/sEDV cmsDescribeArm Pressure (mmHG) +----------+--------+-------+--------+-------------------+ Subclavian160                                        +----------+--------+-------+--------+-------------------+ +---------+--------+--+--------+--+---------+ VertebralPSV cm/s63EDV cm/s12Antegrade +---------+--------+--+--------+--+---------+  Right Stent(s): +---------------+--+-+--------+++ Prox to Stent  274         +---------------+--+-+--------+++ Proximal Stent 49          +---------------+--+-+--------+++ Mid Stent         occluded +---------------+--+-+--------+++ Distal Stent      occluded +---------------+--+-+--------+++ Distal to Stent   occluded +---------------+--+-+--------+++   Left Carotid Findings: +----------+--------+--------+--------+------------------+--------+           PSV cm/sEDV cm/sStenosisPlaque DescriptionComments +----------+--------+--------+--------+------------------+--------+ CCA Prox  133     18              heterogenous               +----------+--------+--------+--------+------------------+--------+ CCA Distal95      17  heterogenous               +----------+--------+--------+--------+------------------+--------+ ICA Prox  97      22      1-39%   heterogenous               +----------+--------+--------+--------+------------------+--------+ ICA Distal108     36                                         +----------+--------+--------+--------+------------------+--------+ ECA       120     10                                          +----------+--------+--------+--------+------------------+--------+ +----------+--------+--------+--------+-------------------+           PSV cm/sEDV cm/sDescribeArm Pressure (mmHG) +----------+--------+--------+--------+-------------------+ [id number]                                         +----------+--------+--------+--------+-------------------+ +---------+--------+--+--------+--+---------+ VertebralPSV cm/s48EDV cm/s13Antegrade +---------+--------+--+--------+--+---------+   Summary: Right Carotid: ICA stent appears occluded. Left Carotid: Velocities in the left ICA are consistent with a 1-39% stenosis. Vertebrals: Bilateral vertebral arteries demonstrate antegrade flow. *See table(s) above for measurements and observations.  Electronically signed by Antony Contras MD on 07/02/2020 at 8:34:52 AM.    Final    VAS [place] LOWER EXTREMITY VENOUS (DVT)  Result Date: 07/01/2020  Lower Venous DVTStudy Indications: Stroke.  Comparison Study: no prior Performing Technologist: [name] RVS  Examination Guidelines: A complete evaluation includes B-mode imaging, spectral Doppler, color Doppler, and power Doppler as needed of all accessible portions of each vessel. Bilateral testing is considered an integral part of a complete examination. Limited examinations for reoccurring indications may be performed as noted. The reflux portion of the exam is performed with the patient in reverse Trendelenburg.  +---------+---------------+---------+-----------+----------+--------------+ RIGHT    CompressibilityPhasicitySpontaneityPropertiesThrombus Aging +---------+---------------+---------+-----------+----------+--------------+ CFV      Full           Yes      Yes                                 +---------+---------------+---------+-----------+----------+--------------+ SFJ      Full                                                         +---------+---------------+---------+-----------+----------+--------------+ FV Prox  Full                                                        +---------+---------------+---------+-----------+----------+--------------+ FV Mid   Full                                                        +---------+---------------+---------+-----------+----------+--------------+  FV DistalFull                                                        +---------+---------------+---------+-----------+----------+--------------+ PFV      Full                                                        +---------+---------------+---------+-----------+----------+--------------+ POP      Full           Yes      Yes                                 +---------+---------------+---------+-----------+----------+--------------+ PTV      Full                                                        +---------+---------------+---------+-----------+----------+--------------+ PERO     Full                                                        +---------+---------------+---------+-----------+----------+--------------+   +---------+---------------+---------+-----------+----------+--------------+ LEFT     CompressibilityPhasicitySpontaneityPropertiesThrombus Aging +---------+---------------+---------+-----------+----------+--------------+ CFV      Full           Yes      Yes                                 +---------+---------------+---------+-----------+----------+--------------+ SFJ      Full                                                        +---------+---------------+---------+-----------+----------+--------------+ FV Prox  Full                                                        +---------+---------------+---------+-----------+----------+--------------+ FV Mid   Full                                                         +---------+---------------+---------+-----------+----------+--------------+ FV DistalFull                                                        +---------+---------------+---------+-----------+----------+--------------+  PFV      Full                                                        +---------+---------------+---------+-----------+----------+--------------+ POP      Full           Yes      Yes                                 +---------+---------------+---------+-----------+----------+--------------+ PTV      Full                                                        +---------+---------------+---------+-----------+----------+--------------+     Summary: BILATERAL: - No evidence of deep vein thrombosis seen in the lower extremities, bilaterally. - No evidence of superficial venous thrombosis in the lower extremities, bilaterally. -   *See table(s) above for measurements and observations. Electronically signed by Harold Barban MD on 07/01/2020 at 9:06:00 PM.    Final    IR ANGIO INTRA EXTRACRAN SEL COM CAROTID INNOMINATE UNI L MOD SED  Result Date: 07/02/2020 INDICATION: New onset right gaze deviation, left hemiplegia, and left-sided neglect. EXAM: 1. EMERGENT LARGE VESSEL OCCLUSION THROMBOLYSIS anterior CIRCULATION) COMPARISON:  CT angiogram of the head and neck of June 29, 2020. MEDICATIONS: Ancef 2 g IV was administered within 1 hour of the procedure. ANESTHESIA/SEDATION: General anesthesia CONTRAST:  Isovue 300 approximately 165 mL FLUOROSCOPY TIME:  Fluoroscopy Time: 97 minutes 0 seconds (3622 mGy). COMPLICATIONS: None immediate. TECHNIQUE: Following a full explanation of the procedure along with the potential associated complications, an informed witnessed consent was obtained from the spouse. The risks of intracranial hemorrhage of 10%, worsening neurological deficit, ventilator dependency, death and inability to revascularize were all reviewed in detail with the  patient's spouse. The patient was then put under general anesthesia by the Department of Anesthesiology at [facility]. The right groin was prepped and draped in the usual sterile fashion. Thereafter using modified Seldinger technique, transfemoral access into the right common femoral artery was obtained without difficulty. Over a 0.035 inch guidewire an 8 French 25 Pinnacle sheath was inserted. Through this, and also over a 0.035 inch guidewire a 5 French 125 cm Berenstein select inside of the balloon guide catheter combinationwas advanced to the aortic arch region and selectively positioned in the distal right common carotid artery. Guidewire, and the select catheter removed. Good aspiration obtained from the hub of the balloon guide catheter in the right common carotid artery. Arteriogram was then performed centered extra cranially and intracranially. FINDINGS: The right common carotid arteriogram demonstrates the right external carotid artery and its major branches to be widely patent. The right internal carotid artery at the bulb demonstrates a moderate sized plaque associated with complete occlusion just distal to the bulb. No evidence of distal angiographic string sign, or reconstitution of right internal carotid artery in the cavernous segment was noted. PROCEDURE: Through the balloon guide catheter, a combination of an 021 Trevo ProVue microcatheter over a 0.014 inch standard Synchro micro guidewire with a J configuration, access through the occluded  right internal carotid artery was obtained with the micro guidewire leading followed by the microcatheter. The combination was advanced to the horizontal petrous segment where the micro guidewire was removed. Poor aspiration was obtained from the microcatheter hub on account of the extensive clot in the entire right internal carotid artery intracranially and extra cranially. The microcatheter was then exchanged for a 014 inch Synchro standard 300 cm  exchange micro guidewire with a J-tip configuration. A control arteriogram performed through the balloon guide demonstrated minimally improved caliber through the proximal right internal carotid artery. A 4 mm x 30 mm Viatrac 14 angioplasty balloon catheter which had been prepped with 50% contrast and 50% heparinized saline infusion and integrated with 75% contrast and 25% heparinized saline infusion was advanced over the exchange micro guidewire using the rapid exchange technique. The proximal and distal markers were then positioned in the described landing zones. A control inflation was then performed using micro inflation syringe device via micro tubing to 12 atmospheres. This was maintained for about 20 seconds and retrieved proximally. A control arteriogram performed through the balloon guide demonstrated improved caliber in the bulb but there continued be a high-grade stenosis in the proximal right internal carotid artery. This prompted a second angioplasty in the manner as described above again to 12 atmospheres for approximately 20 seconds. The balloon was then deflated and retrieved and removed. A control arteriogram performed through the balloon guide demonstrated significantly improved caliber and flow through the angioplastied segment with improved flow to the more distal right internal carotid artery. However, there continued to be extensive clot noted within the internal carotid artery at the cranial skull base and the supraclinoid right ICA. An 021 Trevo ProVue catheter inside of a 071 134 cm aspiration catheter, combination was advanced over the exchange micro guidewire to the cavernous segment of the right internal carotid artery. Exchange micro guidewire was replaced with a regular 014 inch standard Synchro micro guidewire with a J configuration. This was then advanced using a torque device through the occluded right middle cerebral artery into one of the inferior division branches in the M2 M3  region followed by the microcatheter. The guidewire was removed. Good aspiration obtained from the hub of the microcatheter. A gentle control arteriogram performed through this demonstrated slow flow through the vessel itself. A 4 mm x 40 mm Solitaire X retrieval device was then advanced to the distal end of the microcatheter. The retriever was deployed in the usual manner. The Zoom aspiration catheter was advanced to the mid right M1 segment. Continuous aspiration was then performed at the hub of the aspiration catheter for 2-1/2 minutes, with proximal flow arrest in the right internal carotid artery. The retrieval device, the microcatheter and the aspiration catheter were retrieved and removed. Copious amounts of clot were captured into the [place], and also in the retrieval device. Following reversal of flow arrest, a control arteriogram performed through the balloon guide catheter demonstrated now improved flow through the proximal right internal carotid artery extra cranially and intracranially. There is now patency of the right anterior cerebral artery with a stump of the occluded right middle cerebral artery. A second pass was then made again using the above combination. The microcatheter was now accessed into the superior division of the right middle cerebral artery M2 M3 region over a 0.014 inch standard Synchro micro guidewire. Free aspiration of a blood was obtained at the hub of the microcatheter. A Tiger 21 retrieval device was then advanced to the distal  end of the retrieval device. This was then deployed by retrieving the microcatheter. The proximal portion of the retrieval device was within the proximal aspect of the occluded right middle cerebral artery. Free aspiration was then started through the aspiration catheter with the Penumbra aspiration device which was positioned in the distal portion of the occluded clot. The retrieval device was then opened and closed until there was kinking at the  proximal portion. The microcatheter was then advanced to the proximal marker on the retrieval device. With constant aspiration continued, the Tiger 21 retrieval device was then removed as constant aspiration was applied at the hub of the aspiration catheter, and also with proximal flow arrest and aspiration at the hub of the balloon guide with a 60 mL syringe. Multiple fragments of clot were obtained within the [place], and also entangled in the retrieval device. With reversal of the proximal flow arrest, a control arteriogram performed through the balloon guide in the right internal carotid artery now demonstrated improved opacification of the right middle cerebral artery to where there was now opacification of the anterior temporal branch. Distal branch remains occluded with no change in anterior cerebral artery. A third pass was now made using an 027 150 cm Marksman microcatheter which was advanced again using the combination of the 071 Zoom aspiration catheter over a 0.014 inch standard Synchro micro guidewire. This combination was advanced into the now the inferior division with the microcatheter advanced into the M2 region of the inferior division. The micro guidewire was removed. Good aspiration obtained from the hub of the 027 microcatheter. A gentle control arteriogram performed through this now demonstrated free flow into the distal vasculature. The aspiration catheter was engaged into the occluded distal right middle cerebral artery at the origin of the inferior division. Aspiration was now at the hub of the aspiration catheter embedded in the right middle cerebral artery clot and also at the hub of the Marksman catheter for approximately 2 minutes, with proximal flow arrest in the right internal carotid artery and constant aspiration at its hub. The Marksman catheter was then gently retrieved while maintaining aspiration through the Zoom aspiration catheter. The Zoom aspiration catheter remained loft  to aspiration. This was then retrieved and removed. Again clots were seen in the aspirate. Control arteriogram performed following reversal of flow arrest in the right internal carotid artery now demonstrated complete revascularization of the right MCA distribution, into the distal distribution achieving a TICI 2c revascularization. The right anterior cerebral artery remained widely patent. Measurements were now performed of the right internal carotid artery proximally, and the right common carotid artery distally at the proposed landing zones for the revascularization stent. An 021 Trevo ProVue microcatheter was advanced to the petrous segment of the right internal carotid artery over a 0.014 inch standard Synchro micro guidewire, and replaced with an 014 inch 300 cm Synchro exchange micro guidewire. Safe position of the tip of the exchange micro guidewire was verified. It was elected to proceed with placement of a 6/8 mm x 40 mm Xact stent across the diseased previously occluded right internal carotid artery proximally. This was retrogradely purged with heparinized saline infusion. Using the rapid exchange technique, the delivery system of the stent was then advanced and positioned covering distally, and also the proximal portion of the landing zones. Stent was deployed in the usual manner without any difficulty. The delivery system was removed. Good aspiration obtained from the hub of the balloon guide in the right common carotid artery. A  control arteriogram performed through this demonstrated excellent positioning and apposition of the stent with now free flow noted into the intracranial right ICA and the right middle and right anterior cerebral arteries being widely patent. Prior to this patient was loaded with 81 mg aspirin, and 180 mg of Brilinta via an orogastric tube. CT scan of the brain was then performed which demonstrated contrast blush of the right basal ganglia, and also to the 3 focal areas of  hyperdensity suspicious for micro hemorrhages. 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 Three quarter bolus dose of Cangrelor was then given intravenously in order to salvage the occluding stent in the right internal carotid artery. The balloon guide was then removed following removal of the exchange micro guidewire. The diagnostic catheter was then positioned in the left common carotid artery. This demonstrated the left external carotid artery and the left internal carotid arteries to be widely patent. Patency of the left internal carotid to the cranial skull base was verified. Also the left middle and the left anterior cerebral arteries were seen to opacify into the capillary and venous phases. Also almost simultaneously cross-filling via the anterior communicating artery of the right anterior and the right middle cerebral arteries is noted via the anterior communicating artery. No gross filling defects were seen in the middle cerebral artery distribution. 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 The diagnostic catheter was removed. The 8 French Pinnacle sheath was then exchanged for a short 8 [place] sheath which in turn was then removed with the successful application of the 7 [place] ExoSeal closure device with hemostasis in the right groin. Distal pulses remained palpable in the feet bilaterally unchanged. A second CT scan of the brain demonstrated no significant change in the right basal ganglia with continued presence of at least 3 foci of hemorrhage. No mass effect or midline shift was seen. Patient was left intubated on account of patient's medical condition and inability to respond  appropriately to protect the airway. Patient was then transferred to the neuro ICU for post recanalization management. IMPRESSION: Status post endovascular complete revascularization of the right middle cerebral artery and the right anterior cerebral artery with 1 pass with a Solitaire 4 mm x 40 mm X retrieval device and aspiration, and 1 pass with the Tiger 21 retrieval device with proximal aspiration, and with 1 pass with contact aspiration achieving a TICI 2c revascularization of the right middle cerebral artery distribution. 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 Attempt to rescue with 3/4 bolus dose of Cangrelor IV. PLAN: Follow-up in the clinic 4 to 6 weeks post discharge. Electronically Signed   By: Luanne Bras M.D.   On: 06/30/2020 13:20    PHYSICAL EXAM  Temp:  [98.4 F (36.9 C)-100 F (37.8 C)] 98.7 F (37.1 C) (10/10 0400) Pulse Rate:  [50-68] 58 (10/10 0600) Resp:  [12-26] 26 (10/10 0600) BP: (92-151)/(51-77) 137/62 (10/10 0600) SpO2:  [96 %-100 %] 98 % (10/10 0600)  General - Well nourished, well developed, not in acute distress.  Ophthalmologic - fundi not visualized due to noncooperation.  Cardiovascular - Regular rate and rhythm.  Neuro - awake alert, eyes  open, oriented x 3, no aphasia, able to name and repeat but mild dysarthria, able to follow simple commands. Eyes in right gaze perference position but able to cross midline and left gaze incomplete, left hemianopia and visual neglect, however, able to track bilaterally, PERRL. Left facial droop and decreased eye closure. RUE and RLE at least 4/5. LUE proximal 2-/5 and distal 0/5, LLE 2/5 proximal and 0/5 distally. DTR 1+ and no babinski. Sensation symmetrical per pt, however, left sensory neglect. Right FTN intact on the right and gait not  tested.   ASSESSMENT/PLAN [name] is a 69 y.o. male with history of gout admitted for left-sided weakness, right gaze, left facial droop with fall. No tPA given due to outside window.    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 Evolution R frontoinsular infarcts. R ICA occluded.  Carotid Doppler confirmed right ICA re-occluded  2D Echo EF 50 to 55%  LE venous Doppler no DVT  LDL 93  HgbA1c 5.6  Off Heparin sq at request of GI given BRBPR, SCDsfor VTE prophylaxis    No antithrombotic prior to admission, now on aspirin 81 mg daily - off Brilinta given GIB and ICA has re-occluded.    Ongoing aggressive stroke risk factor management  Therapy recommendations:  CIR  Disposition:  Pending - CIR once off levophed and bed available  Transfer to floor once stable off levophed  Carotid occlusion  CTA head and neck right ICA and MCA occlusion  S/p carotid stenting due to severe athero but repeat carotid Doppler confirmed right ICA re-occluded  On ASA   Off brilinta due to LGIB and stent re-occluded  Hypotension and bradycardia Orthostatic hypotension  Likely related to carotid stenting  BP goal 120-160  Had orthostatic episode 10/7 with PT - BP down to 80s on sitting  On Levophed-> turn off -> resumed after orthostatic episode  Cardiology on board  On Midodrine 10 Q8  Add florinef 0.1mg   Long term BP goal 130-150 given occluded right  ICA  LGIB  BRBPR 07/02/2020  Hgb 11.7->10.1->10.3  Off Brilinta, sq heparin (aspirin continued)  GI on board  Currently conservative management   Considering colonoscopy if H&H dropping or continued LGIB - if needed, cards recs temp pacing (no permanent d/t likely transient due to carotid stent and potential for bacteremia)  Hyperlipidemia  Home meds: None  LDL 93, goal < 70  Add lipitor 40  Continue statin at discharge  Dysphagia  Passed swallow  On dys 1 and thin liquid  Speech on board  Other Stroke Risk Factors    Other Active Problems  Gout  Hypokalemia 3.6->3.4->3.5 - supplement  Hospital day # 6 Plan continue close observation and mobilize out of bed if tolerated without significant bradycardia or blood pressure drop.  Follow hematocrit.  Neuro exam stable.  May consider transfer out of the ICU later today okay with cardiology.  Discussed with Dr. Marolyn Hammock critical care medicine and patient's wife and answered questions. This  patient is critically ill due to lower GI bleeding, syncope with hypotension, bradycardia, right ICA occlusion status post stent, stent reocclusion, right MCA stroke status post thrombectomy, and at significant risk of neurological worsening, death form recurrent stroke, hemorrhagic conversion, anemia with hypovolemic shock, seizure, cardiac arrest. This patient's care requires constant monitoring of vital signs, hemodynamics, respiratory and cardiac monitoring, review of multiple databases, neurological assessment, discussion with family, other specialists and medical decision making of high complexity. I spent 32 minutes of neurocritical care time in the care of this patient. I had long discussion with wife at bedside, updated pt current condition, treatment plan and potential prognosis, and answered all the questions. She expressed understanding and appreciation.  I discussed with cardiology Dr Pearline Cables PCCM.  Antony Contras,  MD      To contact Stroke Continuity provider, please refer to [email or web address] After hours, contact General Neurology

## 2020-07-05 NOTE — Progress Notes (Signed)
Occupational Therapy Treatment Patient Details Name: Tyler Richards MRN: [id number] DOB: [birth year] Today's Date: 07/05/2020    History of present illness Patient is a 69 y/o male who presents with left sided weakness, right gaze and slurred speech. NIH:17. Head CT- dense Right MCA infarct. CTA- Right ICA and MCA occlusions. s/p right MCA thrombectomy and right ICA stent placement 06/29/20 with post procedure hemmorhage. No PMH.   OT comments  Pt seen for splint checks throughout today (at x4hrs and x6hrs). With first splint check pt with redness noted along medial aspect of L thumb which dissipated within approx 15 min. Adjusted splint and reapplied, checking back x2 hrs later. Pt again with some redness in same region which improved after splint removal. Pt with no complaints with splint wear and with improvements in edema noted throughout hand. Readjusted splint but left doffed at this time. Pt may likely benefit from additional splint wear trial during the day with OT/splint checks prior to implementing night time wear schedule. Will follow up as able. Acute OT to continue per POC.   Follow Up Recommendations  CIR;Supervision/Assistance - 24 hour    Equipment Recommendations  3 in 1 bedside commode;Wheelchair (measurements OT);Wheelchair cushion (measurements OT);Other (comment) (TBD in next venue)          Precautions / Restrictions Precautions Precautions: Fall Precaution Comments: monitor HR and BP - watch for bradycardia Restrictions Weight Bearing Restrictions: No       Mobility Bed Mobility Overal bed mobility: Needs Assistance             General bed mobility comments: maxA to reposition in bed         ADL either performed or assessed with clinical judgement   ADL Overall ADL's : Needs assistance/impaired                                       General ADL Comments: pt seen for splint check at x4 hrs. noted some redness/indications of pressure  at medial aspect of thumb, symptoms dissipated after approx 15 min. Readjusted splint and reapplied with plans to check back. checked back after x2 additional hours, pt continues to have some redness in same area, which improves after splint remove. adjusted splint but will likely benefit from one more trial of splint wear/splint checks prior to establishing full night wear schedule                        Cognition Arousal/Alertness: Lethargic Behavior During Therapy: [facility] for tasks assessed/performed Overall Cognitive Status: Impaired/Different from baseline Area of Impairment: Attention;Following commands;Safety/judgement;Awareness;Problem solving                   Current Attention Level: Sustained   Following Commands: Follows one step commands with increased time Safety/Judgement: Decreased awareness of safety;Decreased awareness of deficits Awareness: Emergent Problem Solving: Slow processing;Difficulty sequencing;Requires verbal cues;Requires tactile cues          Exercises     Shoulder Instructions       General Comments      Pertinent Vitals/ Pain       Pain Assessment: Faces Faces Pain Scale: No hurt Pain Intervention(s): Monitored during session  Home Living  Prior Functioning/Environment              Frequency  Min 2X/week        Progress Toward Goals  OT Goals(current goals can now be found in the care plan section)  Progress towards OT goals: Progressing toward goals  Acute Rehab OT Goals Patient Stated Goal: to return to PLOF OT Goal Formulation: With patient Time For Goal Achievement: 07/14/20 Potential to Achieve Goals: Good ADL Goals Pt Will Perform Grooming: with min assist;sitting Pt Will Perform Lower Body Bathing: with mod assist;sitting/lateral leans;sit to/from stand Pt Will Perform Upper Body Dressing: with min assist;sitting Pt Will Perform Lower Body  Dressing: with mod assist;sit to/from stand;sitting/lateral leans Pt Will Transfer to Toilet: with mod assist;stand pivot transfer;bedside commode Pt Will Perform Toileting - Clothing Manipulation and hygiene: with mod assist;sit to/from stand Pt/caregiver will Perform Home Exercise Program: Increased strength;Increased ROM;Left upper extremity;With minimal assist;With written HEP provided Additional ADL Goal #1: Pt will attend to L visual field/L side of body during ADL task with no more than min cues. Additional ADL Goal #2: Pt will maintain static sitting balance >5 min at supervision level as precursor to ADL.  Plan Discharge plan remains appropriate    Co-evaluation    PT/OT/SLP Co-Evaluation/Treatment: Yes            AM-PAC OT "6 Clicks" Daily Activity     Outcome Measure   Help from another person eating meals?: A Lot Help from another person taking care of personal grooming?: A Lot Help from another person toileting, which includes using toliet, bedpan, or urinal?: Total Help from another person bathing (including washing, rinsing, drying)?: A Lot Help from another person to put on and taking off regular upper body clothing?: A Lot Help from another person to put on and taking off regular lower body clothing?: Total 6 Click Score: 10    End of Session    OT Visit Diagnosis: Other abnormalities of gait and mobility (R26.89);Hemiplegia and hemiparesis;Other symptoms and signs involving cognitive function;Other symptoms and signs involving the nervous system (R29.898) Hemiplegia - Right/Left: Left Hemiplegia - dominant/non-dominant: Non-Dominant Hemiplegia - caused by: Cerebral infarction   Activity Tolerance Patient tolerated treatment well   Patient Left in bed;with call bell/phone within reach   Nurse Communication Mobility status;Patient requests pain meds        Time: 1644-1700 OT Time Calculation (min): 16 min  Charges: OT General Charges $OT Visit: 1  Visit OT Treatments $Orthotics/Prosthetics Check: 8-22 mins  Lou Cal, OT Acute Rehabilitation Services Pager [phone] Office [place] 07/05/2020, 5:04 PM

## 2020-07-05 NOTE — Progress Notes (Signed)
Occupational Therapy Treatment Patient Details Name: Tyler Richards MRN: [id number] DOB: 07/15/1951 Today's Date: 07/05/2020    History of present illness Patient is a 69 y/o male who presents with left sided weakness, right gaze and slurred speech. NIH:17. Head CT- dense Right MCA infarct. CTA- Right ICA and MCA occlusions. s/p right MCA thrombectomy and right ICA stent placement 06/29/20 with post procedure hemmorhage. No PMH.   OT comments  Pt seen for OT session as he now has resting hand splint for L hand. Applied splint during this session and educated both pt/pt's spouse on reason for splint wear, plans for splint schedule and how to monitor LUE during splint wear with both parties verbalizing understanding. Pt noted to have trace activation both with elbow flexion and extension today (gravity eliminated). Repositioned pt at bed level as pt noted to have redness at R heel - off weighted heels and repositioned for comfort. Will plan to follow up later today for splint check and to establish splint wear schedule.     Follow Up Recommendations  CIR;Supervision/Assistance - 24 hour    Equipment Recommendations  3 in 1 bedside commode;Wheelchair (measurements OT);Wheelchair cushion (measurements OT);Other (comment) (TBD in next venue)          Precautions / Restrictions Precautions Precautions: Fall Precaution Comments: monitor HR and BP - watch for bradycardia Restrictions Weight Bearing Restrictions: No       Mobility Bed Mobility Overal bed mobility: Needs Assistance             General bed mobility comments: repositioned in bed with maxA; pt with LEs in sidelying position, placed pillows between LEs and under LEs to offweight heels - redness noted at R heel            ADL either performed or assessed with clinical judgement   ADL Overall ADL's : Needs assistance/impaired                                       General ADL Comments: focus on resting  hand splint during today's session                       Cognition Arousal/Alertness: Awake/alert Behavior During Therapy: WFL for tasks assessed/performed Overall Cognitive Status: Impaired/Different from baseline Area of Impairment: Attention;Following commands;Safety/judgement;Awareness;Problem solving                   Current Attention Level: Sustained   Following Commands: Follows one step commands with increased time Safety/Judgement: Decreased awareness of safety;Decreased awareness of deficits Awareness: Emergent Problem Solving: Slow processing;Difficulty sequencing;Requires verbal cues;Requires tactile cues          Exercises Exercises: General Upper Extremity General Exercises - Upper Extremity Elbow Flexion: PROM;5 reps;AAROM Elbow Extension: PROM;AAROM;Right;5 reps Digit Composite Flexion: PROM;Left;5 reps Composite Extension: PROM;Left;5 reps Hand Exercises Forearm Supination: PROM;Left;5 reps Forearm Pronation: PROM;Left;5 reps Other Exercises Other Exercises: educated both pt and pt's spouse on benefits of resting hand splint, plans for splint wear schedule and how to monitor LUE for any redness or adverse reactions    Shoulder Instructions       General Comments      Pertinent Vitals/ Pain       Pain Assessment: Faces Faces Pain Scale: Hurts even more Pain Location: L knee Pain Descriptors / Indicators: Sore;Discomfort Pain Intervention(s): Monitored during session;Repositioned;Patient requesting pain meds-RN notified  Home Living                                          Prior Functioning/Environment              Frequency  Min 2X/week        Progress Toward Goals  OT Goals(current goals can now be found in the care plan section)  Progress towards OT goals: Progressing toward goals  Acute Rehab OT Goals Patient Stated Goal: to return to PLOF OT Goal Formulation: With patient Time For Goal  Achievement: 07/14/20 Potential to Achieve Goals: Good ADL Goals Pt Will Perform Grooming: with min assist;sitting Pt Will Perform Lower Body Bathing: with mod assist;sitting/lateral leans;sit to/from stand Pt Will Perform Upper Body Dressing: with min assist;sitting Pt Will Perform Lower Body Dressing: with mod assist;sit to/from stand;sitting/lateral leans Pt Will Transfer to Toilet: with mod assist;stand pivot transfer;bedside commode Pt Will Perform Toileting - Clothing Manipulation and hygiene: with mod assist;sit to/from stand Pt/caregiver will Perform Home Exercise Program: Increased strength;Increased ROM;Left upper extremity;With minimal assist;With written HEP provided Additional ADL Goal #1: Pt will attend to L visual field/L side of body during ADL task with no more than min cues. Additional ADL Goal #2: Pt will maintain static sitting balance >5 min at supervision level as precursor to ADL.  Plan Discharge plan remains appropriate    Co-evaluation    PT/OT/SLP Co-Evaluation/Treatment: Yes            AM-PAC OT "6 Clicks" Daily Activity     Outcome Measure   Help from another person eating meals?: A Lot Help from another person taking care of personal grooming?: A Lot Help from another person toileting, which includes using toliet, bedpan, or urinal?: Total Help from another person bathing (including washing, rinsing, drying)?: A Lot Help from another person to put on and taking off regular upper body clothing?: A Lot Help from another person to put on and taking off regular lower body clothing?: Total 6 Click Score: 10    End of Session    OT Visit Diagnosis: Other abnormalities of gait and mobility (R26.89);Hemiplegia and hemiparesis;Other symptoms and signs involving cognitive function;Other symptoms and signs involving the nervous system (R29.898) Hemiplegia - Right/Left: Left Hemiplegia - dominant/non-dominant: Non-Dominant Hemiplegia - caused by: Cerebral  infarction   Activity Tolerance Patient tolerated treatment well   Patient Left in bed;with call bell/phone within reach;with family/visitor present   Nurse Communication Mobility status;Patient requests pain meds        Time: 1049-1101 OT Time Calculation (min): 12 min  Charges: OT General Charges $OT Visit: 1 Visit OT Treatments $Orthotics Fit/Training: 8-22 mins  Lou Cal, OT Acute Rehabilitation Services Pager [phone] Office [phone]    Raymondo Band 07/05/2020, 12:28 PM

## 2020-07-05 NOTE — Progress Notes (Signed)
      Progress Note   Subjective  I saw the patient earlier this afternoon. No further blood in stool per nursing. HGb has drifted down slightly. HR continues to fluctuate, has been in 40s.    Objective   Vital signs in last 24 hours: Temp:  [98.7 F (37.1 C)-100 F (37.8 C)] 100 F (37.8 C) (10/10 2000) Pulse Rate:  [46-68] 57 (10/10 2000) Resp:  [13-27] 27 (10/10 2000) BP: (92-141)/(40-80) 116/54 (10/10 2000) SpO2:  [96 %-100 %] 98 % (10/10 2000) Last BM Date: 07/03/20 General:    white male in NAD Abdomen:  Soft, nontender and nondistended.  Extremities:  Without edema.   Intake/Output from previous day: 10/09 0701 - 10/10 0700 In: 1710.2 [I.V.:1710.2] Out: 950 [Urine:950] Intake/Output this shift: No intake/output data recorded.  Lab Results: Recent Labs    07/02/20 2310 07/03/20 0311 07/05/20 0314  WBC  --  9.8 7.8  HGB 10.7* 10.3* 9.8*  HCT 31.7* 30.6* 29.1*  PLT  --  336 365   BMET Recent Labs    07/03/20 0311 07/05/20 0314  NA 135 137  K 3.5 3.3*  CL 105 106  CO2 21* 21*  GLUCOSE 102* 93  BUN 12 14  CREATININE 0.77 0.89  CALCIUM 8.2* 8.0*   LFT No results for input(s): PROT, ALBUMIN, AST, ALT, ALKPHOS, BILITOT, BILIDIR, IBILI in the last 72 hours. PT/INR No results for input(s): LABPROT, INR in the last 72 hours.  Studies/Results: No results found.     Assessment / Plan:    69 y/o male with no prior colonoscopy admitted with CVA from right ICA and MCA occlusion. He is s/p R ICA stent assisted angioplasty. Was on aspirin, Brilinta, and sub q heparin and developed a few episodes of rectal bleeding. Hgb has been stable in the, no further bleeding for 2 days now.   Given his long term antiplatelet requirement in light of CVA / stent I do think a colonoscopy is a good idea to assess his long term risk for rebleeding, as he has never had a colonoscopy; however course has been complicated by severe bradycardia with pauses. Per cardiology /  neurology, likely a reaction from the carotid stenting and baroreceptor irritation. Hopefully this improves with time to where he would better tolerate anesthesia for a colonoscopy.   We are standing by to perform colonoscopy when bradycardia improves and he is cleared for anesthesia. Please contact us when you think he is ready and we can proceed. Otherwise, if he has severe / recurrent bleeding in the interim, would recommend CT angio abdomen / pelvis initially to help localize.   We will sign off for now but call as above when he is ready for colonoscopy.   [place] Cellar, MD [facility]

## 2020-07-05 NOTE — Progress Notes (Signed)
NAME:  Tyler Richards, MRN:  [id number], DOB:  13-May-1951, LOS: 6 ADMISSION DATE:  06/29/2020, CONSULTATION DATE:  06/29/20 REFERRING MD:  Estanislado Pandy  CHIEF COMPLAINT:  AMS   Brief History   This is a 69 year old who was admitted on 10/4 with left-sided weakness.  He underwent a M1 thrombectomy and a ICA stent placement.  There was early stent occlusion treated with dual antiplatelet therapy and a IIA IIIB inhibitor.  He developed an intracranial hemorrhage and has had postprocedural difficulties with intermittent bradycardia.    Past Medical History  has Arterial ischemic stroke, MCA, right, acute ([place]); Stroke (cerebrum) ([place]); Acute encephalopathy; Hypertension; Acute respiratory failure with hypoxia ([place]); Middle cerebral artery embolism, right; Lower GI bleed; Adverse reaction to antiplatelet agent; and Bradycardia on their problem list.  Significant Hospital Events   10/4 > admit. One episode of a small amount of red blood per rectum overnight from 10/7-10/8  Consults:  PCCM.  Procedures:  ETT 10/4 > 10/5  Significant Diagnostic Tests:  CT / CTA head 10/4 >  LVO R ICA / prox MCA  MRi brain 10/5 > Acute infarct at the right basal ganglia with nonprogressive hemorrhage when correlated with prior CT. Less extensive patchy acute cortical infarct in the right MCA territory. The areas of acute infarction correlate well with pretreatment CTP. 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  CT head 10/5 >  3.9 x 3.0 x 3.0 cm hyperdense region in the right posterior basal ganglia most consistent with parenchymal hematoma, given small internal fluid levels. Mild mass effect and surrounding edema. No idline shift. Chronic ischemic changes in the right posterior frontal region as seen previously. Echo 10/4 >  Left ventricular ejection fraction, by estimation, is 50 to 58%, grade 1 diastolic dysfunction  Micro Data:  COVID 10/4 > neg. Flu 10/4 >  neg.  Antimicrobials:  None.   Interim history/subjective:  He has had no extreme bradycardia overnight. Continues to be alert and oriented x3 for me this morning.  Objective:  Blood pressure 119/80, pulse (!) 54, temperature 98.7 F (37.1 C), temperature source Axillary, resp. rate (!) 25, height 6\' 2"  (1.88 m), weight 85.1 kg, SpO2 97 %.        Intake/Output Summary (Last 24 hours) at 07/05/2020 1021 Last data filed at 07/05/2020 0800 Gross per 24 hour  Intake 1860.11 ml  Output 950 ml  Net 910.11 ml   Filed Weights   06/29/20 1730  Weight: 85.1 kg    Examination: General: He is very alert and appropriately conversant in no acute distress   HEENT:  Neuro: He is oriented x3 with appropriate speech content. EOMs are full the pupils are equal there is a slight left facial droop. He has 0 strength in the left upper extremity, he has 1-2+ strength in the proximal left lower extremity. CV: S1 and S2 are regular without murmur rub or gallop.   PULM: He has no JVD, respirations are unlabored there is symmetric air movement and no wheezes.  GI: The abdomen is soft without organomegaly masses tenderness guarding or rebound   Extremities: There is no dependent edema.     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 We will attempt to get him  out of bed today to see if he has issues with near syncope. Of note thyroid functions are essentially normal today. .   Syncope  -When attempting to transfer from bed to chair 10/7 patient has a witnessed 11sec pause that resulted in a syncopal episode. At this time vasopressors were resumed.  He continues on Florinef and midodrine. P:  Keep atropine at bedside   Vasopressors as above  Avoid AV nodal blockades   GIB  - Seen with bright red bloody bowel movement 10/7. Had no further GI bleeding we are holding off on any kind of endoscopic evaluation until it is clear that we are not going to have a symptomatic issues with bradycardia. P: GI consulted  Will need to have endoscopy once stabilized  Continue to monitor for recurrent signs of bleeding   Hypokalemia P: Trend Bmet Supplement as needed   Best Practice:  Diet: Pured diet Pain/Anxiety/Delirium protocol (if indicated): N/A VAP protocol (if indicated): N/A DVT prophylaxis: SCD's. GI prophylaxis: PPI. Glucose control: SSI if glucose consistently > 180. Mobility: Bedrest. Code Status: Full. Family Communication: Per primary. Disposition: Remain in ICU due to change in neurological status  Labs   CBC: Recent Labs  Lab 06/29/20 0904 06/29/20 0915 06/30/20 0043 06/30/20 0043 07/01/20 0433 07/02/20 0424 07/02/20 2310 07/03/20 0311 07/05/20 0314  WBC 9.0   < > 9.7  --  11.2* 10.5  --  9.8 7.8  NEUTROABS 6.8  --  7.2  --   --   --   --   --  6.0  HGB 13.9   < > 12.2*   < > 11.7* 10.1* 10.7* 10.3* 9.8*  HCT 41.2   < > 36.2*   < > 34.2* 30.1* 31.7* 30.6* 29.1*  MCV 96.5   < > 97.1  --  99.1 96.2  --  96.2 97.0  PLT 320   < > 380  --  329 287  --  336 365   < > = values in this interval not displayed.   Basic Metabolic Panel: Recent Labs  Lab 06/30/20 0043 07/01/20 0433 07/02/20 0424 07/03/20 0311 07/05/20 0314  NA 138 138 135 135 137  K 3.9 3.6 3.4* 3.5 3.3*  CL 106 106 104 105 106  CO2 20* 21* 22 21* 21*   GLUCOSE 113* 107* 103* 102* 93  BUN 10 8 11 12 14   CREATININE 0.89 0.85 0.79 0.77 0.89  CALCIUM 8.4* 8.5* 8.4* 8.2* 8.0*   GFR: Estimated Creatinine Clearance: 89.8 mL/min (by C-G formula based on SCr of 0.89 mg/dL). Recent Labs  Lab 07/01/20 0433 07/02/20 0424 07/03/20 0311 07/05/20 0314  WBC 11.2* 10.5 9.8 7.8   Liver Function Tests: Recent Labs  Lab 06/29/20 0904  AST 36  ALT 17  ALKPHOS 61  BILITOT 1.8*  PROT 6.5  ALBUMIN 3.3*   No results for input(s): LIPASE, AMYLASE in the last 168 hours. No results for input(s): AMMONIA in the last 168 hours. ABG    Component Value Date/Time   PHART 7.380 06/29/2020 1738   PCO2ART 41.3 06/29/2020 1738   PO2ART 252 (H) 06/29/2020  1738   HCO3 24.4 06/29/2020 1738   TCO2 26 06/29/2020 1738   ACIDBASEDEF 1.0 06/29/2020 1738   O2SAT 100.0 06/29/2020 1738    Coagulation Profile: Recent Labs  Lab 06/29/20 0904  INR 1.0   Cardiac Enzymes: No results for input(s): CKTOTAL, CKMB, CKMBINDEX, TROPONINI in the last 168 hours. HbA1C: Hgb A1c MFr Bld  Date/Time Value Ref Range Status  06/30/2020 12:43 AM 5.6 4.8 - 5.6 % Final    Comment:    (NOTE) Pre diabetes:          5.7%-6.4%  Diabetes:              >6.4%  Glycemic control for   <7.0% adults with diabetes    CBG: Recent Labs  Lab 06/29/20 0904  GLUCAP 88   CRITICAL CARE   Lars Masson, MD [facility] Contact / Pager information can be found on Amion  07/05/2020, 10:21 AM

## 2020-07-06 ENCOUNTER — Other Ambulatory Visit: Payer: Self-pay

## 2020-07-06 ENCOUNTER — Inpatient Hospital Stay (HOSPITAL_COMMUNITY): Payer: Medicare Other

## 2020-07-06 DIAGNOSIS — J9601 Acute respiratory failure with hypoxia: Secondary | ICD-10-CM | POA: Diagnosis not present

## 2020-07-06 LAB — CBC
HCT: 29.3 % — ABNORMAL LOW (ref 39.0–52.0)
Hemoglobin: 9.9 g/dL — ABNORMAL LOW (ref 13.0–17.0)
MCH: 33.1 pg (ref 26.0–34.0)
MCHC: 33.8 g/dL (ref 30.0–36.0)
MCV: 98 fL (ref 80.0–100.0)
Platelets: 406 10*3/uL — ABNORMAL HIGH (ref 150–400)
RBC: 2.99 MIL/uL — ABNORMAL LOW (ref 4.22–5.81)
RDW: 12.4 % (ref 11.5–15.5)
WBC: 10.6 10*3/uL — ABNORMAL HIGH (ref 4.0–10.5)
nRBC: 0 % (ref 0.0–0.2)

## 2020-07-06 LAB — URINALYSIS, ROUTINE W REFLEX MICROSCOPIC
Bilirubin Urine: NEGATIVE
Glucose, UA: NEGATIVE mg/dL
Ketones, ur: 20 mg/dL — AB
Nitrite: POSITIVE — AB
Protein, ur: NEGATIVE mg/dL
Specific Gravity, Urine: 1.023 (ref 1.005–1.030)
WBC, UA: >50 WBC/hpf — ABNORMAL HIGH (ref 0–5)
pH: 5 (ref 5.0–8.0)

## 2020-07-06 LAB — BASIC METABOLIC PANEL
Anion gap: 12 (ref 5–15)
BUN: 15 mg/dL (ref 8–23)
CO2: 19 mmol/L — ABNORMAL LOW (ref 22–32)
Calcium: 8.1 mg/dL — ABNORMAL LOW (ref 8.9–10.3)
Chloride: 106 mmol/L (ref 98–111)
Creatinine, Ser: 0.76 mg/dL (ref 0.61–1.24)
GFR, Estimated: >60 mL/min (ref >60)
Glucose, Bld: 95 mg/dL (ref 70–99)
Potassium: 3.5 mmol/L (ref 3.5–5.1)
Sodium: 137 mmol/L (ref 135–145)

## 2020-07-06 LAB — URIC ACID: Uric Acid, Serum: 4.2 mg/dL (ref 3.7–8.6)

## 2020-07-06 MED ORDER — SODIUM CHLORIDE 0.9 % IV SOLN
1.0000 g | INTRAVENOUS | Status: DC
  Administered 2020-07-06 – 2020-07-07 (×2): 1 g via INTRAVENOUS
  Filled 2020-07-06: qty 10
  Filled 2020-07-06: qty 1
  Filled 2020-07-06: qty 10

## 2020-07-06 MED ORDER — COLCHICINE 0.6 MG PO TABS
1.2000 mg | ORAL_TABLET | Freq: Once | ORAL | Status: AC
  Administered 2020-07-06: 1.2 mg via ORAL
  Filled 2020-07-06: qty 2

## 2020-07-06 MED ORDER — COLCHICINE 0.6 MG PO TABS
0.6000 mg | ORAL_TABLET | Freq: Two times a day (BID) | ORAL | Status: DC
  Administered 2020-07-06 – 2020-07-09 (×6): 0.6 mg via ORAL
  Filled 2020-07-06 (×7): qty 1

## 2020-07-06 MED ORDER — MIDODRINE HCL 5 MG PO TABS
5.0000 mg | ORAL_TABLET | Freq: Three times a day (TID) | ORAL | Status: DC
  Administered 2020-07-06 – 2020-07-09 (×10): 5 mg via ORAL
  Filled 2020-07-06 (×10): qty 1

## 2020-07-06 NOTE — Progress Notes (Signed)
  Speech Language Pathology Treatment: Dysphagia;Cognitive-Linquistic  Patient Details Name: Tyler Richards MRN: [id number] DOB: Dec 25, 1950 Today's Date: 07/06/2020 Time: 9244-6286 SLP Time Calculation (min) (ACUTE ONLY): 13 min  Assessment / Plan / Recommendation Clinical Impression  Pt was seen with advanced PO trials this morning. Min-Mod cues were provided throughout session to find objects in in his left side and to attend to the L side of his oral cavity for management of L pocketing and anterior spillage. He has occasional delayed coughs noted throughout these advanced trials and needs small pieces of cracker softened further to allow for better oral clearance. He did relatively better clearing pills from his oral cavity when given whole in applesauce by RN. Would continue with current diet but can given meds whole in puree if given one at a time.    HPI HPI: Patient is a 69 y/o male who presents with left sided weakness, right gaze and slurred speech. NIH:17. Head CT- dense Right MCA infarct. CTA- Right ICA and MCA occlusions. s/p right MCA thrombectomy and right ICA stent placement 06/29/20 with post procedure hemmorhage. ETT 10/4-10/5. No PMH.      SLP Plan  Continue with current plan of care       Recommendations  Diet recommendations: Dysphagia 1 (puree);Thin liquid Liquids provided via: Straw Medication Administration: Crushed with puree Supervision: Staff to assist with self feeding;Full supervision/cueing for compensatory strategies Compensations: Minimize environmental distractions;Slow rate;Small sips/bites;Lingual sweep for clearance of pocketing Postural Changes and/or Swallow Maneuvers: Seated upright 90 degrees                Oral Care Recommendations: Oral care BID Follow up Recommendations: Inpatient Rehab SLP Visit Diagnosis: Dysphagia, oropharyngeal phase (R13.12) Plan: Continue with current plan of care       GO                Tyler Richards., M.A.  [facility] Pager [phone] Office [phone]  07/06/2020, 10:23 AM

## 2020-07-06 NOTE — Progress Notes (Signed)
Physical Therapy Treatment Patient Details Name: Tyler Richards MRN: [id number] DOB: [birth year] Today's Date: 07/06/2020    History of Present Illness Patient is a 69 y/o male who presents with left sided weakness, right gaze and slurred speech. NIH:17. Head CT- dense Right MCA infarct. CTA- Right ICA and MCA occlusions. s/p right MCA thrombectomy and right ICA stent placement 06/29/20 with post procedure hemmorhage. No PMH.    PT Comments    Patient not progressing due to quickly fatigued this session as well as pain in R elbow and unable to help with lifting into [place].  Patient with improved sitting balance with R hand on bed (not footboard) and able to sit with S for several seconds.  Feel he will continue to progress with skilled PT in the acute setting and continue to recommend CIR level rehab at d/c.    Follow Up Recommendations  CIR     Equipment Recommendations  Other (comment) (defer to next venue)    Recommendations for Other Services       Precautions / Restrictions Precautions Precautions: Fall Precaution Comments: monitor HR and BP - watch for bradycardia Restrictions Weight Bearing Restrictions: No    Mobility  Bed Mobility Overal bed mobility: Needs Assistance Bed Mobility: Rolling;Sidelying to Sit;Sit to Supine Rolling: Mod assist;+2 for safety/equipment Sidelying to sit: +2 for physical assistance;+2 for safety/equipment;Mod assist   Sit to supine: Max assist;+2 for physical assistance;+2 for safety/equipment   General bed mobility comments: maxA to reposition in bed   Transfers Overall transfer level: Needs assistance Equipment used: Ambulation equipment used Transfers: Sit to/from Stand Sit to Stand: Max assist;+2 physical assistance;+2 safety/equipment         General transfer comment: pt able to achieve partial clearance from EOB but unable to reach full standing today. pt reports pain in R elbow with attempts to use UE during standing trials. use  of bed pad to hammock hips with sit<>Stand attempts. Trialled x2 with pt fatigued after 2 standign trials   Ambulation/Gait                 Stairs             Wheelchair Mobility    Modified Rankin (Stroke Patients Only) Modified Rankin (Stroke Patients Only) Pre-Morbid Rankin Score: No symptoms Modified Rankin: Severe disability     Balance Overall balance assessment: Needs assistance Sitting-balance support: Feet supported Sitting balance-Leahy Scale: Poor Sitting balance - Comments: improvements in pt's ability to maintain midline today though still reliant on at least single UE support  Postural control: Left lateral lean Standing balance support: Bilateral upper extremity supported Standing balance-Leahy Scale: Zero Standing balance comment: max A of 2 required to partially stand                            Cognition Arousal/Alertness: Awake/alert Behavior During Therapy: WFL for tasks assessed/performed Overall Cognitive Status: Impaired/Different from baseline Area of Impairment: Attention;Following commands;Safety/judgement;Awareness;Problem solving                   Current Attention Level: Sustained   Following Commands: Follows one step commands with increased time Safety/Judgement: Decreased awareness of safety;Decreased awareness of deficits Awareness: Emergent Problem Solving: Slow processing;Difficulty sequencing;Requires verbal cues;Requires tactile cues        Exercises Other Exercises Other Exercises: AROM/AAROM bilateral LE in supine    General Comments General comments (skin integrity, edema, etc.): noted edema and errythema medial  R ankle, edema in L knee with c/o pain and errythema, edema R elbow, MD aware      Pertinent Vitals/Pain Pain Assessment: Faces Faces Pain Scale: Hurts little more Pain Location: R elbow, L knee (suspect may be gout) Pain Descriptors / Indicators: Discomfort;Grimacing;Sore Pain  Intervention(s): Monitored during session;Repositioned;Limited activity within patient's tolerance    Home Living                      Prior Function            PT Goals (current goals can now be found in the care plan section) Acute Rehab PT Goals Patient Stated Goal: to return to PLOF Progress towards PT goals: Not progressing toward goals - comment    Frequency    Min 4X/week      PT Plan Current plan remains appropriate    Co-evaluation PT/OT/SLP Co-Evaluation/Treatment: Yes Reason for Co-Treatment: Complexity of the patient's impairments (multi-system involvement);For patient/therapist safety          AM-PAC PT "6 Clicks" Mobility   Outcome Measure  Help needed turning from your back to your side while in a flat bed without using bedrails?: A Lot Help needed moving from lying on your back to sitting on the side of a flat bed without using bedrails?: A Lot Help needed moving to and from a bed to a chair (including a wheelchair)?: Total Help needed standing up from a chair using your arms (e.g., wheelchair or bedside chair)?: Total Help needed to walk in hospital room?: Total Help needed climbing 3-5 steps with a railing? : Total 6 Click Score: 8    End of Session Equipment Utilized During Treatment: Gait belt Activity Tolerance: Patient limited by fatigue;Patient limited by pain Patient left: in bed;with call bell/phone within reach;with family/visitor present;with bed alarm set;Other (comment) (MD in the room)   PT Visit Diagnosis: Hemiplegia and hemiparesis;Other abnormalities of gait and mobility (R26.89);Muscle weakness (generalized) (M62.81) Hemiplegia - Right/Left: Left Hemiplegia - dominant/non-dominant: Non-dominant Hemiplegia - caused by: Cerebral infarction     Time: 1006-1040 PT Time Calculation (min) (ACUTE ONLY): 34 min  Charges:  $Therapeutic Activity: 8-22 mins                     Magda Kiel, PT Acute Rehabilitation  Services EL[facility]:[phone] [phone] 07/06/2020    [name] 07/06/2020, 6:24 PM

## 2020-07-06 NOTE — Progress Notes (Signed)
Inpatient Rehabilitation Admissions Coordinator  I await medical workup completion to admit patient to Cir. I met with patient and his wife at bedside.  Danne Baxter, RN, MSN Rehab Admissions Coordinator [phone] 07/06/2020 12:07 PM

## 2020-07-06 NOTE — Progress Notes (Signed)
STROKE TEAM PROGRESS NOTE   SUBJECTIVE (INTERVAL HISTORY) Tyler Richards is at the bedside.  He continues to be bradycardic with heart rate into the 30s when asleep but and awake in the low 50s.  He still not been mobilize out of bed.  Tyler hematocrit is stable.  Vital signs stable.  Neurological exam unchanged.  Hematocrit stable at 29.3.  OBJECTIVE Temp:  [98.5 F (36.9 C)-102.4 F (39.1 C)] 98.5 F (36.9 C) (10/11 0800) Pulse Rate:  [46-64] 52 (10/11 0800) Cardiac Rhythm: Normal sinus rhythm;Sinus bradycardia (10/11 0800) Resp:  [13-29] 19 (10/11 0800) BP: (92-136)/(40-72) 128/61 (10/11 0800) SpO2:  [95 %-100 %] 96 % (10/11 0800)  Recent Labs  Lab 07/01/20 0433 07/01/20 0433 07/02/20 0424 07/02/20 0424 07/03/20 0311 07/05/20 0314 07/06/20 0417  NA 138  --  135  --  135 137 137  K 3.6  --  3.4*  --  3.5 3.3* 3.5  CL 106  --  104  --  105 106 106  CO2 21*  --  22  --  21* 21* 19*  GLUCOSE 107*  --  103*  --  102* 93 95  BUN 8  --  11  --  12 14 15   CREATININE 0.85  --  0.79  --  0.77 0.89 0.76  CALCIUM 8.5*   < > 8.4*   < > 8.2* 8.0* 8.1*   < > = values in this interval not displayed.   Recent Labs  Lab 06/29/20 0904  AST 36  ALT 17  ALKPHOS 61  BILITOT 1.8*  PROT 6.5  ALBUMIN 3.3*   Recent Labs  Lab 06/29/20 0904 06/29/20 0915 06/30/20 0043 06/30/20 0043 07/01/20 0433 07/02/20 0424 07/02/20 2310 07/03/20 0311 07/05/20 0314  WBC 9.0   < > 9.7  --  11.2* 10.5  --  9.8 7.8  NEUTROABS 6.8  --  7.2  --   --   --   --   --  6.0  HGB 13.9   < > 12.2*   < > 11.7* 10.1* 10.7* 10.3* 9.8*  HCT 41.2   < > 36.2*   < > 34.2* 30.1* 31.7* 30.6* 29.1*  MCV 96.5   < > 97.1  --  99.1 96.2  --  96.2 97.0  PLT 320   < > 380  --  329 287  --  336 365   < > = values in this interval not displayed.   Imaging past 24h DG CHEST PORT 1 VIEW  Result Date: 07/06/2020 CLINICAL DATA:  Fever EXAM: PORTABLE CHEST 1 VIEW COMPARISON:  June 30, 2020 FINDINGS: Endotracheal tube and  nasogastric tube no longer present. There is no edema or airspace opacity. Heart size is upper normal with pulmonary vascularity normal. No adenopathy. No bone lesions. IMPRESSION: No edema or airspace opacity.  Heart upper normal in size. Electronically Signed   By: Lowella Grip III M.D.   On: 07/06/2020 10:57    PHYSICAL EXAM    Temp:  [98.5 F (36.9 C)-102.4 F (39.1 C)] 98.5 F (36.9 C) (10/11 0800) Pulse Rate:  [46-64] 52 (10/11 0800) Resp:  [13-29] 19 (10/11 0800) BP: (92-136)/(40-72) 128/61 (10/11 0800) SpO2:  [95 %-100 %] 96 % (10/11 0800)  General - Well nourished, well developed, not in acute distress.  Ophthalmologic - fundi not visualized due to noncooperation.  Cardiovascular - Regular rate and rhythm.  Neuro - awake alert, eyes open, oriented x 3, no aphasia, able to  name and repeat but mild dysarthria, able to follow simple commands. Eyes in right gaze perference position but able to cross midline and left gaze incomplete, left hemianopia and visual neglect, however, able to track bilaterally, PERRL. Left facial droop and decreased eye closure. RUE and RLE at least 4/5. LUE proximal 2-/5 and distal 0/5, LLE 2/5 proximal and 0/5 distally. DTR 1+ and no babinski. Sensation symmetrical per pt, however, left sensory neglect. Right FTN intact on the right and gait not tested.   ASSESSMENT/PLAN Tyler Richards is a 69 y.o. male with history of gout admitted for left-sided weakness, right gaze, left facial droop with fall. No tPA given due to outside window.    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 Evolution R frontoinsular infarcts. R ICA occluded.  Carotid Doppler confirmed right ICA re-occluded  2D Echo EF 50 to 55%  LE venous Doppler no DVT  LDL 93  HgbA1c 5.6  Off Heparin sq at request of GI given BRBPR, SCDsfor VTE prophylaxis    No antithrombotic prior to admission, now on aspirin 81 mg daily  (off Brilinta given GIB and ICA has re-occluded)  Therapy recommendations:  CIR  Disposition:  Pending   Transfer to floor   CCM signed off ->Dr. Leonie Man consulted [facility] for ongoing medical mgmt on the floor  Carotid occlusion  CTA head and neck right ICA and MCA occlusion  S/p carotid stenting due to severe athero but repeat carotid Doppler confirmed right ICA re-occluded  On ASA   Off brilinta due to LGIB and stent re-occluded  Hypotension and severe sinus bradycardia Orthostatic hypotension  Likely related to carotid stenting  cards does nor support permanent pacing at this time d/t likely transient due to carotid stent and potential for bacteremia  Had orthostatic episode 10/7 with PT - BP down to 80s on sitting  On Levophed-> turn off -> resumed after orthostatic episode->off  Cardiology on board  On Midodrine 5 tid 10/6->10q8 10/7->decreasing dose to 5 tid 10/11 d/t risk bradycardia - monitor - plan to wean  On florinef 0.1mg   Long term BP goal 130-150 given occluded right ICA  LGIB  BRBPR 07/02/2020  Hgb 11.7->10.1->10.3->9.8->9.9 - drifting down  Off Brilinta, sq heparin (aspirin continued)  GI on board  Currently conservative management   GI standing by for colonoscopy when bradycardia improves and cleared by anesthesia - if urgent need, recommend CTAngio abd/pelvis to help localize - contact GI when pt stable for colon  montior  Fever  TM 102.4  WBC 7.8 ->10.6   CXR NAD  UA + bacteria,  +nitrates, >50 WBC  UCx pending   Empiric Rocephin 10/11>> (5 days)    Hyperlipidemia  Home meds: None  LDL 93, goal < 70  Add lipitor 40  Continue statin at discharge  Dysphagia  Passed swallow  On dys 1 and thin liquid  Speech on board  Other Stroke Risk Factors    Other Active Problems  Gout  Hypokalemia  3.6->3.4->3.5 - supplement - resolved  L knee & R elbow pain - warm and red and swollen - suspect gout flare (has had in past) - uric acid 4.2 - on colchicine and resumed home allopurinol  Hospital day # 7 Try cautious mobilization out of bed if tolerated.  Continue conservative treatment for Tyler GI bleed which appears stable.  Transfer out of ICU if okay with cardiology.  Critical care team plans to sign off hence will get medical hospitalist team to consult and take over Tyler care.  Long discussion with patient and Richards at the bedside and spoke to critical care MD.This patient is critically ill and at significant risk of neurological worsening, death and care requires constant monitoring of vital signs, hemodynamics,respiratory and cardiac monitoring, extensive review of multiple databases, frequent neurological assessment, discussion with family, other specialists and medical decision making of high complexity.I have made any additions or clarifications directly to the above note.This critical care time does not reflect procedure time, or teaching time or supervisory time of PA/NP/Med Resident etc but could involve care discussion time.  I spent 30 minutes of neurocritical care time  in the care of  this patient.      Antony Contras, MD      To contact Stroke Continuity provider, please refer to [email or web address] After hours, contact General Neurology

## 2020-07-06 NOTE — Progress Notes (Signed)
Occupational Therapy Treatment Patient Details Name: Tyler Richards MRN: [id number] DOB: 1950/12/21 Today's Date: 07/06/2020    History of present illness Patient is a 69 y/o male who presents with left sided weakness, right gaze and slurred speech. NIH:17. Head CT- dense Right MCA infarct. CTA- Right ICA and MCA occlusions. s/p right MCA thrombectomy and right ICA stent placement 06/29/20 with post procedure hemmorhage. No PMH.   OT comments  Pt with gradual progress towards OT goals. He continues to require two person assist for mobility attempts including sit<>stand attempts via [place] today. Pt clearing hips from EOB but unable to achieve full standing, partly due to pain in R elbow and L knee (suspect possible gout flareup). BP and HR stable with seated activity/standing trials. Pt with improved ability to maintain midline while seated EOB using single UE support. L resting hand splint donned pre and post session with plans to follow up for splint check later today. Will continue per POC at this time.    Follow Up Recommendations  CIR;Supervision/Assistance - 24 hour    Equipment Recommendations  3 in 1 bedside commode;Wheelchair (measurements OT);Wheelchair cushion (measurements OT);Other (comment) (TBD in next venue)          Precautions / Restrictions Precautions Precautions: Fall Precaution Comments: monitor HR and BP - watch for bradycardia Restrictions Weight Bearing Restrictions: No       Mobility Bed Mobility Overal bed mobility: Needs Assistance Bed Mobility: Rolling;Sidelying to Sit;Sit to Supine Rolling: Mod assist;+2 for safety/equipment Sidelying to sit: +2 for physical assistance;+2 for safety/equipment;Mod assist   Sit to supine: Max assist;+2 for physical assistance;+2 for safety/equipment   General bed mobility comments: steps for sequencing so that pt could best self-assist. assist for LEs to/from EOB and trunk elevation   Transfers Overall transfer level:  Needs assistance Equipment used: Ambulation equipment used Transfers: Sit to/from Stand Sit to Stand: Max assist;+2 physical assistance;+2 safety/equipment         General transfer comment: pt able to achieve partial clearance from EOB but unable to reach full standing today. pt reports pain in R elbow with attempts to use UE during standing trials. use of bed pad to hammock hips with sit<>Stand attempts. Trialled x2 with pt fatigued after 2 standign trials     Balance Overall balance assessment: Needs assistance Sitting-balance support: Single extremity supported;Feet supported Sitting balance-Leahy Scale: Poor Sitting balance - Comments: improvements in pt's ability to maintain midline today though still reliant on at least single UE support  Postural control: Left lateral lean Standing balance support: Single extremity supported;Bilateral upper extremity supported Standing balance-Leahy Scale: Zero                             ADL either performed or assessed with clinical judgement   ADL Overall ADL's : Needs assistance/impaired     Grooming: Brushing hair;Minimal assistance;Sitting Grooming Details (indicate cue type and reason): requires assist for sitting balance without UE support              Lower Body Dressing: Total assistance;+2 for physical assistance;+2 for safety/equipment;Bed level               Functional mobility during ADLs: Maximal assistance;+2 for physical assistance;+2 for safety/equipment       Vision   Additional Comments: pt better able to read the time on the clock (placed to pt's L) and to read the sign on the bathroom door when seated  EOB    Perception     Praxis      Cognition Arousal/Alertness: Awake/alert Behavior During Therapy: WFL for tasks assessed/performed Overall Cognitive Status: Impaired/Different from baseline Area of Impairment: Attention;Following commands;Safety/judgement;Awareness;Problem solving                    Current Attention Level: Sustained   Following Commands: Follows one step commands with increased time Safety/Judgement: Decreased awareness of safety;Decreased awareness of deficits Awareness: Emergent Problem Solving: Slow processing;Difficulty sequencing;Requires verbal cues;Requires tactile cues          Exercises General Exercises - Upper Extremity Elbow Flexion: AAROM;Left;5 reps Elbow Extension: AAROM;Left;5 reps Digit Composite Flexion: PROM;Left;5 reps Composite Extension: PROM;Left;5 reps Hand Exercises Forearm Supination: PROM;Left;5 reps Forearm Pronation: PROM;Left;5 reps Other Exercises Other Exercises: donning L resting hand splint approx 1.5 hours prior to therapy session. pt with mild redness at L thumb and palm with dissipates after removal. reapplied splint end of session after readjusting and will follow up for splint check.    Shoulder Instructions       General Comments      Pertinent Vitals/ Pain       Pain Assessment: Faces Faces Pain Scale: Hurts even more Pain Location: R elbow, L knee (suspect may be gout) Pain Descriptors / Indicators: Discomfort;Grimacing;Sore Pain Intervention(s): Monitored during session;Repositioned  Home Living                                          Prior Functioning/Environment              Frequency  Min 2X/week        Progress Toward Goals  OT Goals(current goals can now be found in the care plan section)     Acute Rehab OT Goals Patient Stated Goal: to return to PLOF OT Goal Formulation: With patient Time For Goal Achievement: 07/14/20 Potential to Achieve Goals: Good ADL Goals Pt Will Perform Grooming: with min assist;sitting Pt Will Perform Lower Body Bathing: with mod assist;sitting/lateral leans;sit to/from stand Pt Will Perform Upper Body Dressing: with min assist;sitting Pt Will Perform Lower Body Dressing: with mod assist;sit to/from  stand;sitting/lateral leans Pt Will Transfer to Toilet: with mod assist;stand pivot transfer;bedside commode Pt Will Perform Toileting - Clothing Manipulation and hygiene: with mod assist;sit to/from stand Pt/caregiver will Perform Home Exercise Program: Increased strength;Increased ROM;Left upper extremity;With minimal assist;With written HEP provided Additional ADL Goal #1: Pt will attend to L visual field/L side of body during ADL task with no more than min cues. Additional ADL Goal #2: Pt will maintain static sitting balance >5 min at supervision level as precursor to ADL.  Plan Discharge plan remains appropriate    Co-evaluation    PT/OT/SLP Co-Evaluation/Treatment: Yes Reason for Co-Treatment: Complexity of the patient's impairments (multi-system involvement);For patient/therapist safety;To address functional/ADL transfers   OT goals addressed during session: ADL's and self-care      AM-PAC OT "6 Clicks" Daily Activity     Outcome Measure   Help from another person eating meals?: A Lot Help from another person taking care of personal grooming?: A Lot Help from another person toileting, which includes using toliet, bedpan, or urinal?: Total Help from another person bathing (including washing, rinsing, drying)?: A Lot Help from another person to put on and taking off regular upper body clothing?: A Lot Help from another person to put on and  taking off regular lower body clothing?: Total 6 Click Score: 10    End of Session Equipment Utilized During Treatment: Gait belt  OT Visit Diagnosis: Other abnormalities of gait and mobility (R26.89);Hemiplegia and hemiparesis;Other symptoms and signs involving cognitive function;Other symptoms and signs involving the nervous system (R29.898) Hemiplegia - Right/Left: Left Hemiplegia - dominant/non-dominant: Non-Dominant Hemiplegia - caused by: Cerebral infarction   Activity Tolerance Patient tolerated treatment well   Patient Left in  bed;with call bell/phone within reach;with bed alarm set;with family/visitor present   Nurse Communication Mobility status        Time: 3491-7915 OT Time Calculation (min): 34 min  Charges: OT General Charges $OT Visit: 1 Visit OT Treatments $Self Care/Home Management : 8-22 mins  Lou Cal, OT Acute Rehabilitation Services Pager [phone] Office [phone]     Raymondo Band 07/06/2020, 2:13 PM

## 2020-07-06 NOTE — Progress Notes (Addendum)
Progress Note  Patient Name: Tyler Richards Date of Encounter: 07/06/2020  [facility] Cardiologist: new to [facility]  Subjective   No CP, palpitations, no recurrent syncope, denies any dizzy spells, has not been out of bed  Inpatient Medications    Scheduled Meds: .  stroke: mapping our early stages of recovery book   Does not apply Once  . aspirin  81 mg Oral Daily   Or  . aspirin  81 mg Per Tube Daily  . atorvastatin  40 mg Oral Daily  . chlorhexidine  15 mL Mouth Rinse BID  . Chlorhexidine Gluconate Cloth  6 each Topical Daily  . docusate  100 mg Oral BID  . feeding supplement (ENSURE ENLIVE)  237 mL Oral TID BM  . fludrocortisone  0.1 mg Oral Daily  . influenza vaccine adjuvanted  0.5 mL Intramuscular Tomorrow-1000  . mouth rinse  15 mL Mouth Rinse q12n4p  . midodrine  10 mg Oral Q8H  . pantoprazole  40 mg Oral Daily  . pneumococcal 23 valent vaccine  0.5 mL Intramuscular Tomorrow-1000  . polyethylene glycol  17 g Oral Daily   Continuous Infusions: . sodium chloride 250 mL (07/06/20 4098)  . norepinephrine (LEVOPHED) Adult infusion Stopped (07/04/20 0600)   PRN Meds: acetaminophen **OR** acetaminophen (TYLENOL) oral liquid 160 mg/5 mL **OR** acetaminophen, senna-docusate   Vital Signs    Vitals:   07/06/20 0400 07/06/20 0500 07/06/20 0600 07/06/20 0700  BP: (!) 115/52 (!) 106/57 123/72 (!) 133/57  Pulse: (!) 56 (!) 53 (!) 56 (!) 55  Resp: (!) 22 (!) 21 (!) 22 (!) 22  Temp: 98.7 F (37.1 C)     TempSrc: Oral     SpO2: 97% 98% 98% 96%  Weight:      Height:        Intake/Output Summary (Last 24 hours) at 07/06/2020 0719 Last data filed at 07/06/2020 0600 Gross per 24 hour  Intake 1756.95 ml  Output 1750 ml  Net 6.95 ml   Last 3 Weights 06/29/2020  Weight (lbs) 187 lb 9.8 oz  Weight (kg) 85.1 kg      Telemetry    SB/SR, brief/transient SB 40's, intermittent AV block assoc w PP prolongation , oone WCT is sinus/atrial with no change in morphology  appreciated - Personally Reviewed  ECG    No new EKGs - Personally Reviewed  Physical Exam   GEN: No acute distress.   Neck: No JVD Cardiac: RRR, no murmurs, rubs, or gallops.  Respiratory: CTA b/l. GI: Soft, nontender, non-distended  MS: No edema; No deformity. Neuro:  L hemiparesis, he apparrently wiggled his toes L foot this AM Psych: Normal affect   Labs    High Sensitivity Troponin:  No results for input(s): TROPONINIHS in the last 720 hours.    Chemistry Recent Labs  Lab 06/29/20 0904 06/29/20 0915 06/30/20 0043 07/01/20 0433 07/03/20 0311 07/05/20 0314 07/06/20 0417  NA 136   < > 138   < > 135 137 137  K 5.0   < > 3.9   < > 3.5 3.3* 3.5  CL 103   < > 106   < > 105 106 106  CO2 20*   < > 20*   < > 21* 21* 19*  GLUCOSE 104*   < > 113*   < > 102* 93 95  BUN 14   < > 10   < > 12 14 15   CREATININE 0.87   < > 0.89   < >  0.77 0.89 0.76  CALCIUM 9.1   < > 8.4*   < > 8.2* 8.0* 8.1*  PROT 6.5  --   --   --   --   --   --   ALBUMIN 3.3*  --   --   --   --   --   --   AST 36  --   --   --   --   --   --   ALT 17  --   --   --   --   --   --   ALKPHOS 61  --   --   --   --   --   --   BILITOT 1.8*  --   --   --   --   --   --   GFRNONAA >60   < > >60   < > >60 >60 >60  GFRAA >60  --  >60  --   --   --   --   ANIONGAP 13   < > 12   < > 9 10 12    < > = values in this interval not displayed.     Hematology Recent Labs  Lab 07/02/20 0424 07/02/20 0424 07/02/20 2310 07/03/20 0311 07/05/20 0314  WBC 10.5  --   --  9.8 7.8  RBC 3.13*  --   --  3.18* 3.00*  HGB 10.1*   < > 10.7* 10.3* 9.8*  HCT 30.1*   < > 31.7* 30.6* 29.1*  MCV 96.2  --   --  96.2 97.0  MCH 32.3  --   --  32.4 32.7  MCHC 33.6  --   --  33.7 33.7  RDW 12.5  --   --  12.4 12.3  PLT 287  --   --  336 365   < > = values in this interval not displayed.    BNPNo results for input(s): BNP, PROBNP in the last 168 hours.   DDimer No results for input(s): DDIMER in the last 168 hours.   Radiology      No results found.  Cardiac Studies   06/30/2020: TTE IMPRESSIONS  1. Left ventricular ejection fraction, by estimation, is 50 to 55%. The  left ventricle has low normal function. The left ventricle has no regional  wall motion abnormalities. Left ventricular diastolic parameters are  consistent with Grade I diastolic  dysfunction (impaired relaxation).  2. Right ventricular systolic function is normal. The right ventricular  size is normal. There is mildly elevated pulmonary artery systolic  pressure.  3. Left atrial size was mildly dilated.  4. The mitral valve is normal in structure. No evidence of mitral valve  regurgitation. No evidence of mitral stenosis.  5. The aortic valve is normal in structure. Aortic valve regurgitation is  not visualized. No aortic stenosis is present.  6. The inferior vena cava is dilated in size with <50% respiratory  variability, suggesting right atrial pressure of 15 mmHg.   Patient Profile     69 y.o. male w/PMHx of gout only admitted 06/29/2020 with acute stroke > due to right ICA and MCA occlusion s/p IR with TICI2c and carotid stenting which reoccluded, secondary to large vessel disease source  His stay has been complicated by hypotension and bradycardic events both felt 2/2 carotid stent/manipulation >> further complicated by acute GIB requiring brilinta to be held to be held  Assessment & Plan    1. Post  stroke/right ICA stenting bradycardia and pauses     HRs largely appear similar to the last few days  By my review.     He has not had an prolonged pauses or slow rates, mostly 50's (40's-60's)     No historical EKGs, RBBB here He had a couple strips with non conducted P waves, there was sinus slowing/change in sinus rates, perhaps slight PR prolongation, given his current medical conduction, favor vagal?  I will review with Dr. Caryl Comes BP looks OK Florinef 0.1mg  daily started 07/03/20 Midodrine 5mg  TID started 07/01/20 >> 10mg  Q8 on  07/02/20  While I am bedside talking with his wife, the pt drifts to sleep, his HR dipped to high 30's, quickly back to the 50's with waking.  Will discuss with Dr. Caryl Comes, mobilization as well as clearance for colonoscopy     For questions or updates, please contact [facility] Please consult [email or web address] for contact info under        Signed, Baldwin [place], PA-C  07/06/2020, 7:19 AM    Bradycardia, sinus with some concomitant AV block  RBBB  Stroke s/p R ICA stentiing and residual hemiparesis  Orthostatics    GI bleed   Bradycardia most consistent with hypervagatonia with concomitant AV block and PP prolongation  This seems to occur infrequently post carotid manipulation and stroke can be assoc with orthostatic hypotension  Will check orthostatics Midodrine is typically dosed while not supine and so should be dosed appropriately BP is low enough to use florinef safely   OOB to chair or reverse trendelenburg if possible

## 2020-07-06 NOTE — Progress Notes (Signed)
Occupational Therapy Treatment Patient Details Name: Tyler Richards MRN: [id number] DOB: 13-Aug-1951 Today's Date: 07/06/2020    History of present illness Patient is a 69 y/o male who presents with left sided weakness, right gaze and slurred speech. NIH:17. Head CT- dense Right MCA infarct. CTA- Right ICA and MCA occlusions. s/p right MCA thrombectomy and right ICA stent placement 06/29/20 with post procedure hemmorhage. No PMH.   OT comments  Pt seen for splint check approx 4 hrs after reapplying splint (pt wearing for 6hrs total today). Pt with some mild indentation/swelling noted at lateral aspect of thumb which improved with time out of splint, no redness noted today which is improved from yesterday's wear. Pt denies any discomfort or pain with splint wear. Splint schedule issued and provided to pt's spouse as pt transferring rooms shortly. Pt to wear L resting hand splint at night for 4-6 hours (no more than 6 hours). Will continue per POC.   Follow Up Recommendations  CIR;Supervision/Assistance - 24 hour    Equipment Recommendations  3 in 1 bedside commode;Wheelchair (measurements OT);Wheelchair cushion (measurements OT);Other (comment) (TBD in next venue)          Precautions / Restrictions Precautions Precautions: Fall Precaution Comments: monitor HR and BP - watch for bradycardia Restrictions Weight Bearing Restrictions: No       Mobility Bed Mobility Overal bed mobility: Needs Assistance         General bed mobility comments: maxA to reposition in bed                   ADL either performed or assessed with clinical judgement   ADL Overall ADL's : Needs assistance/impaired                                   General ADL Comments: pt seen for splint check approx 4hrs after splint re-donned post therapy session. pt with some slight indentation noted at lateral aspect of thumb which improved with time. pt without redness to medial aspect of  thumb today. he denies pain/discomfort      Vision      Perception     Praxis      Cognition Arousal/Alertness: Awake/alert Behavior During Therapy: WFL for tasks assessed/performed Overall Cognitive Status: Impaired/Different from baseline Area of Impairment: Attention;Following commands;Safety/judgement;Awareness;Problem solving                   Current Attention Level: Sustained   Following Commands: Follows one step commands with increased time Safety/Judgement: Decreased awareness of safety;Decreased awareness of deficits Awareness: Emergent Problem Solving: Slow processing;Difficulty sequencing;Requires verbal cues;Requires tactile cues          Exercises   Shoulder Instructions       General Comments issued splint wear schedule to pt's spouse as pt about to transfer to new room. pt to wear splint at night for 4-6 hrs (no more than 6 hrs)     Pertinent Vitals/ Pain       Pain Assessment: Faces Faces Pain Scale: No hurt Pain Location: R elbow, L knee (suspect may be gout) Pain Descriptors / Indicators: Discomfort;Grimacing;Sore Pain Intervention(s): Monitored during session  Home Living                                          Prior Functioning/Environment  Frequency  Min 2X/week        Progress Toward Goals  OT Goals(current goals can now be found in the care plan section)  Progress towards OT goals: Progressing toward goals  Acute Rehab OT Goals Patient Stated Goal: to return to PLOF OT Goal Formulation: With patient Time For Goal Achievement: 07/14/20 Potential to Achieve Goals: Good ADL Goals Pt Will Perform Grooming: with min assist;sitting Pt Will Perform Lower Body Bathing: with mod assist;sitting/lateral leans;sit to/from stand Pt Will Perform Upper Body Dressing: with min assist;sitting Pt Will Perform Lower Body Dressing: with mod assist;sit to/from stand;sitting/lateral leans Pt Will  Transfer to Toilet: with mod assist;stand pivot transfer;bedside commode Pt Will Perform Toileting - Clothing Manipulation and hygiene: with mod assist;sit to/from stand Pt/caregiver will Perform Home Exercise Program: Increased strength;Increased ROM;Left upper extremity;With minimal assist;With written HEP provided Additional ADL Goal #1: Pt will attend to L visual field/L side of body during ADL task with no more than min cues. Additional ADL Goal #2: Pt will maintain static sitting balance >5 min at supervision level as precursor to ADL.  Plan Discharge plan remains appropriate    Co-evaluation    PT/OT/SLP Co-Evaluation/Treatment: Yes Reason for Co-Treatment: Complexity of the patient's impairments (multi-system involvement);For patient/therapist safety;To address functional/ADL transfers   OT goals addressed during session: ADL's and self-care      AM-PAC OT "6 Clicks" Daily Activity     Outcome Measure   Help from another person eating meals?: A Lot Help from another person taking care of personal grooming?: A Lot Help from another person toileting, which includes using toliet, bedpan, or urinal?: Total Help from another person bathing (including washing, rinsing, drying)?: A Lot Help from another person to put on and taking off regular upper body clothing?: A Lot Help from another person to put on and taking off regular lower body clothing?: Total 6 Click Score: 10    End of Session Equipment Utilized During Treatment: Gait belt  OT Visit Diagnosis: Other abnormalities of gait and mobility (R26.89);Hemiplegia and hemiparesis;Other symptoms and signs involving cognitive function;Other symptoms and signs involving the nervous system (R29.898) Hemiplegia - Right/Left: Left Hemiplegia - dominant/non-dominant: Non-Dominant Hemiplegia - caused by: Cerebral infarction   Activity Tolerance Patient tolerated treatment well   Patient Left in bed;with call bell/phone within  reach;with family/visitor present   Nurse Communication Mobility status;Patient requests pain meds        Time: 1440-1450 OT Time Calculation (min): 10 min  Charges: OT General Charges $OT Visit: 1 Visit OT Treatments  $Orthotics/Prosthetics Check: 8-22 mins  Lou Cal, OT Acute Rehabilitation Services Pager [phone] Office [phone]    Raymondo Band 07/06/2020, 4:51 PM

## 2020-07-06 NOTE — Progress Notes (Signed)
Updated wife at bedside.

## 2020-07-06 NOTE — Progress Notes (Signed)
NAME:  Tyler Richards, MRN:  [id number], DOB:  1951/08/13, LOS: 7 ADMISSION DATE:  06/29/2020, CONSULTATION DATE:  06/29/20 REFERRING MD:  Estanislado Pandy  CHIEF COMPLAINT:  AMS   Brief History   This is a 69 year old who was admitted on 10/4 with left-sided weakness.  He underwent a M1 thrombectomy and a ICA stent placement.  There was early stent occlusion treated with dual antiplatelet therapy and a IIA IIIB inhibitor.  He developed an intracranial hemorrhage and has had postprocedural difficulties with intermittent bradycardia.    Past Medical History  has Arterial ischemic stroke, MCA, right, acute ([place]); Stroke (cerebrum) ([place]); Acute encephalopathy; Hypertension; Acute respiratory failure with hypoxia ([place]); Middle cerebral artery embolism, right; Lower GI bleed; Adverse reaction to antiplatelet agent; and Bradycardia on their problem list.  Significant Hospital Events   10/4 > admit. One episode of a small amount of red blood per rectum overnight from 10/7-10/8  Consults:  PCCM.  Procedures:  ETT 10/4 > 10/5  Significant Diagnostic Tests:  CT / CTA head 10/4 >  LVO R ICA / prox MCA  MRi brain 10/5 > Acute infarct at the right basal ganglia with nonprogressive hemorrhage when correlated with prior CT. Less extensive patchy acute cortical infarct in the right MCA territory. The areas of acute infarction correlate well with pretreatment CTP. 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  CT head 10/5 >  3.9 x 3.0 x 3.0 cm hyperdense region in the right posterior basal ganglia most consistent with parenchymal hematoma, given small internal fluid levels. Mild mass effect and surrounding edema. No idline shift. Chronic ischemic changes in the right posterior frontal region as seen previously. Echo 10/4 >  Left ventricular ejection fraction, by estimation, is 50 to 20%, grade 1 diastolic dysfunction  Micro Data:  COVID 10/4 > neg. Flu 10/4 >  neg.  Antimicrobials:  None.   Interim history/subjective:   10/11: bp remained stable and asx bradycardia Objective:  Blood pressure (!) 133/57, pulse (!) 55, temperature 98.7 F (37.1 C), temperature source Oral, resp. rate (!) 22, height 6\' 2"  (1.88 m), weight 85.1 kg, SpO2 96 %.        Intake/Output Summary (Last 24 hours) at 07/06/2020 0802 Last data filed at 07/06/2020 0600 Gross per 24 hour  Intake 1607 ml  Output 1750 ml  Net -143 ml   Filed Weights   06/29/20 1730  Weight: 85.1 kg    Examination: General: He is very alert and appropriately conversant in no acute distress   HEENT: NCAT, some L facial droop. mmmp Neuro: He is oriented x3 with appropriate speech content. EOMs are full the pupils are equal there is a slight left facial droop. He has 0 strength in the left upper extremity, he has 1-2+ strength in the proximal left lower extremity. CV: S1 and S2 are regular without murmur rub or gallop.   PULM: He has no JVD, respirations are unlabored there is symmetric air movement and no wheezes.  GI: The abdomen is soft without organomegaly masses tenderness guarding or rebound   Extremities: There is no dependent edema. L knee swollen with redness and warmth. R elbow swollen with redness and warmth.     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 -cont pt/ot  -appreciate EP/cards recs -agree ? Midodrine as can cause+/-exacerbate bradycardia.Marland Kitchen will decrease dose today and monitor bp with plans to wean   Syncope  -When attempting to transfer from bed to chair 10/7 patient has a witnessed 11sec pause that resulted in a syncopal episode.   He continues on Florinef and weaning midodrine P: Avoid AV nodal blockades   GIB  - Seen with bright red bloody bowel movement 10/7. Had no further GI bleeding we are holding off on any kind of endoscopic evaluation until it is clear that we are not going to have a symptomatic issues with bradycardia. P: GI consulted  Will need to have endoscopy once stabilized  Continue to monitor for recurrent signs of bleeding  -trend hgb  Hypokalemia P: Resolved  L knee and R elbow pain:  Suspect gout flare - warm and red and swollen  -pt states has previously had gout flares in these locations.  -check uric acid -start colchicine and resume home allopurinol.   Best Practice:  Diet: Pured diet Pain/Anxiety/Delirium protocol (if indicated): N/A VAP protocol (if indicated): N/A DVT prophylaxis: SCD's. GI prophylaxis: PPI. Glucose control: SSI if glucose consistently > 180. Mobility: Bedrest. Code Status: Full. Family Communication: Per primary. Disposition: stable for transfer to progressive and recommend TRH following along for medical care with primary. CCM will sign off.   Labs   CBC: Recent Labs  Lab 06/29/20 0904 06/29/20 0915 06/30/20 0043 06/30/20 0043 07/01/20 0433 07/02/20 0424 07/02/20 2310 07/03/20 0311 07/05/20 0314  WBC 9.0   < > 9.7  --  11.2* 10.5  --  9.8 7.8  NEUTROABS 6.8  --  7.2  --   --   --   --   --  6.0  HGB 13.9   < > 12.2*   < > 11.7* 10.1* 10.7* 10.3* 9.8*  HCT 41.2   < > 36.2*   < > 34.2* 30.1* 31.7* 30.6* 29.1*  MCV 96.5   < > 97.1  --  99.1 96.2  --  96.2 97.0  PLT 320   < > 380  --  329 287  --  336 365   < > = values in this interval not  displayed.   Basic Metabolic Panel: Recent Labs  Lab 07/01/20 0433 07/02/20 0424 07/03/20 0311 07/05/20 0314 07/06/20 0417  NA 138 135 135 137 137  K 3.6 3.4* 3.5 3.3* 3.5  CL 106 104 105 106 106  CO2 21* 22 21* 21* 19*  GLUCOSE 107* 103* 102* 93 95  BUN 8 11 12 14 15   CREATININE 0.85 0.79 0.77 0.89 0.76  CALCIUM 8.5* 8.4* 8.2* 8.0* 8.1*   GFR: Estimated Creatinine Clearance: 99.9 mL/min (by C-G formula based on SCr of 0.76 mg/dL). Recent Labs  Lab 07/01/20 0433 07/02/20 0424 07/03/20 0311 07/05/20 0314  WBC 11.2* 10.5 9.8 7.8   Liver Function Tests: Recent Labs  Lab 06/29/20 0904  AST 36  ALT 17  ALKPHOS 61  BILITOT 1.8*  PROT 6.5  ALBUMIN 3.3*   No results for input(s): LIPASE, AMYLASE in the last 168 hours. No  results for input(s): AMMONIA in the last 168 hours. ABG    Component Value Date/Time   PHART 7.380 06/29/2020 1738   PCO2ART 41.3 06/29/2020 1738   PO2ART 252 (H) 06/29/2020 1738   HCO3 24.4 06/29/2020 1738   TCO2 26 06/29/2020 1738   ACIDBASEDEF 1.0 06/29/2020 1738   O2SAT 100.0 06/29/2020 1738    Coagulation Profile: Recent Labs  Lab 06/29/20 0904  INR 1.0   Cardiac Enzymes: No results for input(s): CKTOTAL, CKMB, CKMBINDEX, TROPONINI in the last 168 hours. HbA1C: Hgb A1c MFr Bld  Date/Time Value Ref Range Status  06/30/2020 12:43 AM 5.6 4.8 - 5.6 % Final    Comment:    (NOTE) Pre diabetes:          5.7%-6.4%  Diabetes:              >6.4%  Glycemic control for   <7.0% adults with diabetes    CBG: Recent Labs  Lab 06/29/20 0904  GLUCAP 88   CRITICAL CARE   care time 35 mins. This represents my time independent of the NPs time taking care of the pt. This is excluding procedures.    [facility] 07/06/2020, 8:02 AM

## 2020-07-06 NOTE — Progress Notes (Signed)
NIR Brief Rounding Note:  History of acute CVA s/p cerebral arteriogram with emergent mechanical thrombectomy of right MCA and right ACA achieving a TICI 2c revascularization, along with revascularization of proximal right ICA acute occlusion using stent assisted angioplasty with reocclusion via right femoral approach 06/29/2020 by Dr. Estanislado Pandy.  Recommended outpatient plans per Dr. Estanislado Pandy: 1- Continue taking Aspirin 81 mg once daily. 2- Plan to follow-up with carotid US followed by consultation with Dr. Estanislado Pandy 3 months after discharge (our schedulers to call patient to set up this appointment).  Further plans per neurology/CCM/cardiology- appreciate and agree with management. Please call NIR with questions/concerns.   Bea Graff Jonea Bukowski, PA-C 07/06/2020, 9:23 AM

## 2020-07-07 DIAGNOSIS — N390 Urinary tract infection, site not specified: Secondary | ICD-10-CM

## 2020-07-07 LAB — CBC WITH DIFFERENTIAL/PLATELET
Abs Immature Granulocytes: 0.04 10*3/uL (ref 0.00–0.07)
Basophils Absolute: 0 10*3/uL (ref 0.0–0.1)
Basophils Relative: 0 %
Eosinophils Absolute: 0 10*3/uL (ref 0.0–0.5)
Eosinophils Relative: 0 %
HCT: 27.9 % — ABNORMAL LOW (ref 39.0–52.0)
Hemoglobin: 9.4 g/dL — ABNORMAL LOW (ref 13.0–17.0)
Immature Granulocytes: 1 %
Lymphocytes Relative: 10 %
Lymphs Abs: 0.8 10*3/uL (ref 0.7–4.0)
MCH: 32.4 pg (ref 26.0–34.0)
MCHC: 33.7 g/dL (ref 30.0–36.0)
MCV: 96.2 fL (ref 80.0–100.0)
Monocytes Absolute: 0.9 10*3/uL (ref 0.1–1.0)
Monocytes Relative: 11 %
Neutro Abs: 6.3 10*3/uL (ref 1.7–7.7)
Neutrophils Relative %: 78 %
Platelets: 460 10*3/uL — ABNORMAL HIGH (ref 150–400)
RBC: 2.9 MIL/uL — ABNORMAL LOW (ref 4.22–5.81)
RDW: 12.2 % (ref 11.5–15.5)
WBC: 8 10*3/uL (ref 4.0–10.5)
nRBC: 0 % (ref 0.0–0.2)

## 2020-07-07 LAB — COMPREHENSIVE METABOLIC PANEL
ALT: 79 U/L — ABNORMAL HIGH (ref 0–44)
AST: 105 U/L — ABNORMAL HIGH (ref 15–41)
Albumin: 1.8 g/dL — ABNORMAL LOW (ref 3.5–5.0)
Alkaline Phosphatase: 134 U/L — ABNORMAL HIGH (ref 38–126)
Anion gap: 7 (ref 5–15)
BUN: 11 mg/dL (ref 8–23)
CO2: 22 mmol/L (ref 22–32)
Calcium: 8 mg/dL — ABNORMAL LOW (ref 8.9–10.3)
Chloride: 108 mmol/L (ref 98–111)
Creatinine, Ser: 0.69 mg/dL (ref 0.61–1.24)
GFR, Estimated: >60 mL/min (ref >60)
Glucose, Bld: 120 mg/dL — ABNORMAL HIGH (ref 70–99)
Potassium: 3.6 mmol/L (ref 3.5–5.1)
Sodium: 137 mmol/L (ref 135–145)
Total Bilirubin: 0.7 mg/dL (ref 0.3–1.2)
Total Protein: 5.2 g/dL — ABNORMAL LOW (ref 6.5–8.1)

## 2020-07-07 LAB — PHOSPHORUS: Phosphorus: 2.5 mg/dL (ref 2.5–4.6)

## 2020-07-07 LAB — MAGNESIUM: Magnesium: 2.1 mg/dL (ref 1.7–2.4)

## 2020-07-07 MED ORDER — SODIUM CHLORIDE 0.9 % IV SOLN
2.0000 g | Freq: Two times a day (BID) | INTRAVENOUS | Status: DC
  Administered 2020-07-07 – 2020-07-08 (×2): 2 g via INTRAVENOUS
  Filled 2020-07-07 (×3): qty 2

## 2020-07-07 NOTE — Progress Notes (Signed)
Physical Therapy Treatment Patient Details Name: Tyler Richards MRN: [id number] DOB: 23-May-1951 Today's Date: 07/07/2020    History of Present Illness Patient is a 69 y/o male who presents with left sided weakness, right gaze and slurred speech. NIH:17. Head CT- dense Right MCA infarct. CTA- Right ICA and MCA occlusions. s/p right MCA thrombectomy and right ICA stent placement 06/29/20 with post procedure hemmorhage. No PMH.    PT Comments    Pt making minimal progress this date as he was able to fully clear his buttocks with STS from elevated EOB to stand to sit in stedy. However, once sitting in stedy he began to push with his R UE and lean strongly to the L, requiring maxA to block and cues to correct, with min success and min ability to follow directions. Pt reported dizziness that improved initially with slow, deep breathing but then HR dropped to 30s, thus pt returned to supine in bed with +2 person assist from PT and tech, with HR improving immeditately. Nurse was aware of the change in vitals. Will continue to follow acutely to maximize his independence and safety with functional mobility through addressing his deficits mentioned below.    Follow Up Recommendations  CIR;Supervision/Assistance - 24 hour     Equipment Recommendations  Other (comment) (defer to next venue)    Recommendations for Other Services Rehab consult     Precautions / Restrictions Precautions Precautions: Fall Precaution Comments: monitor HR and BP - watch for bradycardia, condom catheter Restrictions Weight Bearing Restrictions: No    Mobility  Bed Mobility Overal bed mobility: Needs Assistance Bed Mobility: Supine to Sit;Sit to Supine     Supine to sit: Mod assist;HOB elevated Sit to supine: Max assist;+2 for safety/equipment   General bed mobility comments: Cued pt to bring LEs towards EOB, with success. Cued pt for hand placement on bed rail to pull and ascend trunk. Required maxAx2 to return to  supine due to safety concerns with bradycardia,.  Transfers Overall transfer level: Needs assistance Equipment used: Ambulation equipment used Transfers: Sit to/from Stand Sit to Stand: Max assist;+2 physical assistance;+2 safety/equipment         General transfer comment: Required maxAx2 and cues for hand placement on stedy to come to full stand. Pt reported feeling dizzy once seated in stedy with HR dropping to 30s, thus returned pt to bed with HR promptly increasing to 40s. Nurse aware.  Ambulation/Gait                 Stairs             Wheelchair Mobility    Modified Rankin (Stroke Patients Only) Modified Rankin (Stroke Patients Only) Pre-Morbid Rankin Score: No symptoms Modified Rankin: Severe disability     Balance Overall balance assessment: Needs assistance Sitting-balance support: Feet supported Sitting balance-Leahy Scale: Poor Sitting balance - Comments: Able to maintain midline sitting statically EOB with UE support. When sitting in steady pt began to push with R UE and lean strongly to L, requiring maxA to block and cues to correct with min success. Postural control: Left lateral lean Standing balance support: Bilateral upper extremity supported Standing balance-Leahy Scale: Zero Standing balance comment: MaxA x 2 to stand statically brifly in stedy                            Cognition Arousal/Alertness: Awake/alert Behavior During Therapy: WFL for tasks assessed/performed Overall Cognitive Status: Impaired/Different from baseline Area  of Impairment: Attention;Safety/judgement;Following commands;Problem solving                   Current Attention Level: Sustained   Following Commands: Follows one step commands consistently;Follows one step commands with increased time;Follows multi-step commands inconsistently;Follows multi-step commands with increased time Safety/Judgement: Decreased awareness of safety;Decreased awareness  of deficits   Problem Solving: Slow processing;Difficulty sequencing;Requires verbal cues;Requires tactile cues General Comments: Requires extra time and multiple cues to perform all tasks.      Exercises      General Comments        Pertinent Vitals/Pain Pain Assessment: 0-10 Pain Score: 10-Worst pain ever Pain Location: L LE (from gout per pt) Pain Descriptors / Indicators: Discomfort;Grimacing;Sore Pain Intervention(s): Monitored during session;Limited activity within patient's tolerance (communicated with RN on pain meds)    Home Living                      Prior Function            PT Goals (current goals can now be found in the care plan section) Acute Rehab PT Goals Patient Stated Goal: to get better PT Goal Formulation: With patient/family Time For Goal Achievement: 07/14/20 Potential to Achieve Goals: Fair Progress towards PT goals: Progressing toward goals (dizziness and bradycardia limit progress)    Frequency    Min 4X/week      PT Plan Current plan remains appropriate    Co-evaluation     PT goals addressed during session: Balance;Mobility/safety with mobility        AM-PAC PT "6 Clicks" Mobility   Outcome Measure  Help needed turning from your back to your side while in a flat bed without using bedrails?: A Lot Help needed moving from lying on your back to sitting on the side of a flat bed without using bedrails?: A Lot Help needed moving to and from a bed to a chair (including a wheelchair)?: Total Help needed standing up from a chair using your arms (e.g., wheelchair or bedside chair)?: Total Help needed to walk in hospital room?: Total Help needed climbing 3-5 steps with a railing? : Total 6 Click Score: 8    End of Session Equipment Utilized During Treatment: Gait belt Activity Tolerance: Patient limited by fatigue;Patient limited by pain;Treatment limited secondary to medical complications (Comment) (bradycardia) Patient  left: in bed;with call bell/phone within reach;with bed alarm set;with nursing/sitter in room;with family/visitor present (wife in room during session) Nurse Communication: Mobility status;Other (comment) (HR; condom catheter status; pain) PT Visit Diagnosis: Hemiplegia and hemiparesis;Other abnormalities of gait and mobility (R26.89);Muscle weakness (generalized) (M62.81);Difficulty in walking, not elsewhere classified (R26.2);Unsteadiness on feet (R26.81) Hemiplegia - Right/Left: Left Hemiplegia - dominant/non-dominant: Non-dominant Hemiplegia - caused by: Cerebral infarction     Time: 3734-2876 PT Time Calculation (min) (ACUTE ONLY): 29 min  Charges:  $Therapeutic Activity: 23-37 mins                     Moishe Spice, PT, DPT Acute Rehabilitation Services  Pager: [phone] Office: [place] 07/07/2020, 3:50 PM

## 2020-07-07 NOTE — Progress Notes (Addendum)
11 y/0 male admitted with stroke right MCA infarct due to right ICA and MCA occlusion s/p IR with TICI2c and carotid stenting which reoccluded, secondary to large vessel disease source Neurology noted:  CT head right frontal old infarcts, right MCA hyperdensity  CTA head and neck right ICA and MCA occlusion, right P1 stenosis, left VA origin atherosclerosis  CT perfusion positive for large penumbra  CT head right posterior BG hematoma  MRI right MCA scattered infarcts with hemorrhagic conversion, right MCA patent but right ICA reoccluded  CT repeat decreasing size R basal ganglia hemorrhage. Evolution R frontoinsular infarcts. R ICA occluded.  Carotid Doppler confirmed right ICA re-occluded   Post stroke, right ICA stenting developed bradycardia, pauses, felt most consistent with hypervagatonia  Telemetry reviewed. SB/SR rates 50's and 60's, overnight early AM with rates 40's, (38-39bpm very transiently) No AV block noted since out of ICU  Rates improving, no pauses of 3 seconds or more, no more AV block Weaning midodrine On florinef  Continue to avoid nodal blocking agents    EP will continue to follow from [place], PA-C

## 2020-07-07 NOTE — PMR Pre-admission (Signed)
PMR Admission Coordinator Pre-Admission Assessment  Patient: Tyler Richards is an 69 y.o., male MRN: [id number] DOB: 1950-10-02 Height: 6\' 2"  (188 cm) Weight: 85.1 kg              Insurance Information HMO:     PPO:      PCP:      IPA:      80/20:      OTHER:  PRIMARY: Medicare a and b      Policy#: [id number]      Subscriber: pt Benefits:  Phone #: passport one source online     Name: 07/07/2020 Eff. Date: 03/27/2015     Deduct: $1484      Out of Pocket Max: none      Life Max: none  CIR: 100%      SNF: 20 full days Outpatient: 80%     Co-Pay: [place]: 1005      Co-Pay: none DME: 80%     Co-Pay: 20% Providers: pt choice  SECONDARY: none  Financial Counselor:       Phone#:   [facility] [employer] for patients in Inpatient Rehabilitation Facilities with attached Privacy Act [place] Records was provided and verbally reviewed with: Patient and Family  Emergency Contact Information Contact Information    Name Relation Home Work Mobile   Angie, [place] Spouse   [phone]     Current Medical History  Patient Admitting Diagnosis: CVA  History of Present Illness: a 69 year old right-handed male with unremarkable past medical history no prescription medications.  Per chart review lives with spouse independent and active prior to admission.  1 level home 2 steps to entry.  Presented 06/29/2020 with acute onset of left-sided weakness and slurred speech.  Cranial CT scan showed hyperdense distal right ICA and proximal MCA.  Blunted appearance of the posterior right putamen.  Chronic right high frontal cortex infarct.  Patient did not receive TPA.  CT angiogram of head and neck emergent large vessel occlusion with no flow seen in the right internal carotid artery or proximal MCA.  Patient underwent right MCA thrombectomy and right ICA stent placement 06/29/2020 per interventional radiology. Most recent MRI and imaging revealed acute infarct right basal  ganglia with nonprogressive hemorrhage when correlated with a prior CT of 06/29/2020.  Lower extremity Dopplers no signs of DVT.  Carotid Dopplers no ICA stenosis.  Echocardiogram with ejection fraction of 50 to 65% grade 1 diastolic dysfunction.  Admission chemistries unremarkable aside from glucose 104 urine drug screen negative.  Patient did receive cardiology consult for bradycardia consistent with hyper vagotonia and currently maintained on ProAmatine as well as Florinef.  EKG normal sinus rhythm 70s to 80s occasional sinus bradycardia.  Patient was initially maintained on aspirin as well as Brilinta.  Patient was extubated 06/30/2020.  Urine culture greater 100,000 Enterobacter placed on Maxipime changed to Bactrim.  Gastroenterology services consulted due to some bright red blood with bowel movement currently holding off on any endoscopic evaluation monitor hemoglobin hematocrit with latest hemoglobin 9.8 and no further episodes reported.  His Brilinta has been placed on hold after rectal outlet bleeding and continues only on low-dose aspirin at the recommendations of neurology services.  Currently on a dysphagia #1 thin liquid diet.  Therapy evaluations completed and patient to be admitted for a comprehensive inpatient rehab program.  Complete NIHSS TOTAL: 10 Glasgow Coma Scale Score: 15  Past Medical History  History reviewed. No pertinent past medical history.  Family History  family history is not on file.  Prior Rehab/Hospitalizations:  Has the patient had prior rehab or hospitalizations prior to admission? Yes  Has the patient had major surgery during 100 days prior to admission? Yes  Current Medications   Current Facility-Administered Medications:     stroke: mapping our early stages of recovery book, , Does not apply, Once, Biby, Sharon L, NP   0.9 %  sodium chloride infusion, 250 mL, Intravenous, Continuous, Biby, Sharon L, NP, Last Rate: 75 mL/hr at 07/08/20 1419, 250 mL at  07/08/20 1419   acetaminophen (TYLENOL) tablet 650 mg, 650 mg, Oral, Q4H PRN, 650 mg at 07/07/20 2030 **OR** acetaminophen (TYLENOL) 160 MG/5ML solution 650 mg, 650 mg, Per Tube, Q4H PRN **OR** acetaminophen (TYLENOL) suppository 650 mg, 650 mg, Rectal, Q4H PRN, Donzetta Starch, NP, 650 mg at 07/01/20 0515   aspirin chewable tablet 81 mg, 81 mg, Oral, Daily, 81 mg at 07/08/20 1111 **OR** aspirin chewable tablet 81 mg, 81 mg, Per Tube, Daily, Biby, Sharon L, NP, 81 mg at 07/07/20 0843   atorvastatin (LIPITOR) tablet 40 mg, 40 mg, Oral, Daily, Biby, Sharon L, NP, 40 mg at 07/08/20 1111   chlorhexidine (PERIDEX) 0.12 % solution 15 mL, 15 mL, Mouth Rinse, BID, Biby, Sharon L, NP, 15 mL at 07/07/20 2122   Chlorhexidine Gluconate Cloth 2 % PADS 6 each, 6 each, Topical, Daily, Biby, Sharon L, NP, 6 each at 07/08/20 1113   [COMPLETED] colchicine tablet 1.2 mg, 1.2 mg, Oral, Once, 1.2 mg at 07/06/20 1205 **FOLLOWED BY** colchicine tablet 0.6 mg, 0.6 mg, Oral, BID, Biby, Sharon L, NP, 0.6 mg at 07/08/20 1112   docusate (COLACE) 50 MG/5ML liquid 100 mg, 100 mg, Oral, BID, Biby, Sharon L, NP, 100 mg at 07/07/20 2122   feeding supplement (ENSURE ENLIVE) (ENSURE ENLIVE) liquid 237 mL, 237 mL, Oral, TID BM, Pahwani, Ravi, MD   fludrocortisone (FLORINEF) tablet 0.1 mg, 0.1 mg, Oral, Daily, Biby, Sharon L, NP, 0.1 mg at 07/08/20 1111   influenza vaccine adjuvanted (FLUAD) injection 0.5 mL, 0.5 mL, Intramuscular, Tomorrow-1000, Chand, Sudham, MD   MEDLINE mouth rinse, 15 mL, Mouth Rinse, q12n4p, Biby, Sharon L, NP, 15 mL at 07/08/20 1114   midodrine (PROAMATINE) tablet 5 mg, 5 mg, Oral, Q8H, Biby, Sharon L, NP, 5 mg at 07/08/20 1414   pantoprazole (PROTONIX) EC tablet 40 mg, 40 mg, Oral, Daily, Biby, Sharon L, NP, 40 mg at 07/08/20 1111   pneumococcal 23 valent vaccine (PNEUMOVAX-23) injection 0.5 mL, 0.5 mL, Intramuscular, Tomorrow-1000, Chand, Sudham, MD   polyethylene glycol (MIRALAX / GLYCOLAX) packet  17 g, 17 g, Oral, Daily, Biby, Sharon L, NP, 17 g at 07/08/20 1110   senna-docusate (Senokot-S) tablet 1 tablet, 1 tablet, Oral, QHS PRN, Burnetta Sabin L, NP   sulfamethoxazole-trimethoprim (BACTRIM DS) 800-160 MG per tablet 1 tablet, 1 tablet, Oral, Q12H, Darliss Cheney, MD  Patients Current Diet:  Diet Order            DIET - DYS 1 Room service appropriate? Yes with Assist; Fluid consistency: Thin  Diet effective now                 Precautions / Restrictions Precautions Precautions: Fall Precaution Comments: monitor HR and BP - watch for bradycardia, condom catheter Restrictions Weight Bearing Restrictions: No Other Position/Activity Restrictions: n/a   Has the patient had 2 or more falls or a fall with injury in the past year?No  Prior Activity Level Community (5-7x/wk): Independent and active  Prior Functional Level Prior Function Level of Independence: Independent Comments: Independent for ADLs, walking. Drives.  Self Care: Did the patient need help bathing, dressing, using the toilet or eating?  Independent  Indoor Mobility: Did the patient need assistance with walking from room to room (with or without device)? Independent  Stairs: Did the patient need assistance with internal or external stairs (with or without device)? Independent  Functional Cognition: Did the patient need help planning regular tasks such as shopping or remembering to take medications? [place] / Equipment Home Assistive Devices/Equipment: Grab bars around toilet, Grab bars in shower, Wheelchair, Environmental consultant (specify type), Eyeglasses Home Equipment: Grab bars - tub/shower, Cane - single point, Environmental consultant - 2 wheels, Wheelchair - manual  Prior Device Use: Indicate devices/aids used by the patient prior to current illness, exacerbation or injury? None of the above  Current Functional Level Cognition  Arousal/Alertness: Awake/alert Overall Cognitive Status:  Impaired/Different from baseline Current Attention Level: Sustained Orientation Level: Oriented X4 Following Commands: Follows one step commands consistently, Follows one step commands with increased time, Follows multi-step commands inconsistently Safety/Judgement: Decreased awareness of safety, Decreased awareness of deficits General Comments: Requires extra time and one step cues repeated to follow directions. Pt anxious with standing due to fear of falling. Attention: Sustained Sustained Attention: Appears intact (during brief intervals of time) Memory: Impaired Memory Impairment: Retrieval deficit, Decreased short term memory Decreased Short Term Memory: Verbal basic (recalled 2/4 words independently; 4/4 with cues) Awareness: Impaired Awareness Impairment: Emergent impairment Problem Solving: Impaired Problem Solving Impairment: Functional basic, Functional complex Executive Function: Self Monitoring, Self Correcting Self Monitoring: Impaired Self Monitoring Impairment: Functional basic Self Correcting: Impaired Self Correcting Impairment: Functional basic Safety/Judgment: Impaired Comments: L inattention    Extremity Assessment (includes Sensation/Coordination)  Upper Extremity Assessment: LUE deficits/detail LUE Deficits / Details: trace activation noted with elbow flexion and extension today LUE Sensation: decreased light touch LUE Coordination: decreased fine motor, decreased gross motor  Lower Extremity Assessment: Defer to PT evaluation LLE Deficits / Details: Grossly ~2-/5 hip flexion, 2+/5 knee extension, 2+/5 PF, 0/5 DF. LLE Sensation: decreased light touch, decreased proprioception LLE Coordination: decreased fine motor, decreased gross motor    ADLs  Overall ADL's : Needs assistance/impaired Eating/Feeding: NPO Grooming: Brushing hair, Minimal assistance, Sitting Grooming Details (indicate cue type and reason): requires assist for sitting balance without UE  support  Upper Body Bathing: Moderate assistance, Sitting Lower Body Bathing: Maximal assistance, +2 for safety/equipment, +2 for physical assistance, Sitting/lateral leans, Sit to/from stand Upper Body Dressing : Moderate assistance, Sitting Lower Body Dressing: Total assistance, +2 for physical assistance, +2 for safety/equipment, Bed level Toileting- Clothing Manipulation and Hygiene: Total assistance, +2 for physical assistance, +2 for safety/equipment, Bed level Functional mobility during ADLs: Maximal assistance, +2 for physical assistance, +2 for safety/equipment General ADL Comments: pt seen for splint check approx 4hrs after splint re-donned post therapy session. pt with some slight indentation noted at lateral aspect of thumb which improved with time. pt without redness to medial aspect of thumb today. he denies pain/discomfort     Mobility  Overal bed mobility: Needs Assistance Bed Mobility: Supine to Sit, Sit to Supine Rolling: Mod assist, +2 for safety/equipment Sidelying to sit: +2 for physical assistance, +2 for safety/equipment, Mod assist Supine to sit: Mod assist, HOB elevated Sit to supine: Max assist General bed mobility comments: Cued pt to bring LEs to EOB and R hand to L bed rail to transition supine > sit EOB, modA to  manage trunk and minA to manage LEs. Sit > supine with maxA, cuing pt to descend onto L elbow and assistance to manage trunk and LEs.    Transfers  Overall transfer level: Needs assistance Equipment used: None Transfer via Lift Equipment: Stedy Transfers: Sit to/from Stand Sit to Stand: Max assist, From elevated surface Stand pivot transfers: Total assist General transfer comment: Performed 5 mini-STS from raised EOB without rest and B knee block and TCs and assistance under buttocks and pt cued to lean chest on therapist's shoulder, maxA. Improved buttocks clearance noted with each subsequent rep.     Ambulation / Gait / Stairs / Wheelchair Mobility   Ambulation/Gait General Gait Details: unable    Posture / Balance Dynamic Sitting Balance Sitting balance - Comments: Initially min guard for static sitting EOB with UE support, but as pt fatigued he required min-modA intermittently and cues to lean posteriorly and find midline. Balance Overall balance assessment: Needs assistance Sitting-balance support: Feet supported, Bilateral upper extremity supported Sitting balance-Leahy Scale: Poor Sitting balance - Comments: Initially min guard for static sitting EOB with UE support, but as pt fatigued he required min-modA intermittently and cues to lean posteriorly and find midline. Postural control: Left lateral lean Standing balance support: Single extremity supported Standing balance-Leahy Scale: Zero Standing balance comment: MaxA for momentary static partial standing on elevated EOB, with hands on therapist or bed, not coming to full stand, maxA and B knee block.    Special needs/care consideration     Previous Home Environment  Living Arrangements: Spouse/significant other  Lives With: Spouse Available Help at Discharge: Family, Available 24 hours/day Type of Home: Mobile home Home Layout: One level Home Access: Stairs to enter Entrance Stairs-Rails: Right Entrance Stairs-Number of Steps: 2 Bathroom Shower/Tub: Optometrist: Yes How Accessible: Accessible via walker [place]: No  Discharge Living Setting Plans for Discharge Living Setting: Patient's home, Mobile Home Type of Home at Discharge: Mobile home Discharge Home Layout: One level Discharge Home Access: Stairs to enter Entrance Stairs-Rails: Right Entrance Stairs-Number of Steps: 2 Discharge Bathroom Shower/Tub: Tub/shower unit Discharge Bathroom Toilet: Standard Discharge Bathroom Accessibility: Yes How Accessible: Accessible via walker Does the patient have any problems obtaining your medications?:  No  Social/Family/Support Systems Patient Roles: Spouse Contact Information: wife, [name] Anticipated Caregiver: wife Anticipated Ambulance person Information: see above Caregiver Availability: 24/7 Discharge Plan Discussed with Primary Caregiver: Yes Is Caregiver In Agreement with Plan?: Yes Does Caregiver/Family have Issues with Lodging/Transportation while Pt is in Rehab?: No  Goals Patient/Family Goal for Rehab: min assist with PT, OT and SLP Expected length of stay: ELOS 2 to 3 weeks Pt/Family Agrees to Admission and willing to participate: Yes Program Orientation Provided & Reviewed with Pt/Caregiver Including Roles  & Responsibilities: Yes  Decrease burden of Care through IP rehab admission: n/a  Possible need for SNF placement upon discharge:not anticipated  Patient Condition: This patient's medical and functional status has changed since the consult dated: 07/01/2020 in which the Rehabilitation Physician determined and documented that the patient's condition is appropriate for intensive rehabilitative care in an inpatient rehabilitation facility. See "History of Present Illness" (above) for medical update. Functional changes are: Mod to max assist for mobility and ADLs. Patient's medical and functional status update has been discussed with the Rehabilitation physician and patient remains appropriate for inpatient rehabilitation. Will admit to inpatient rehab today.  Preadmission Screen Completed By: Danne Baxter RN MSN with updates by Danne Baxter  Jefm Bryant, RN, 07/08/2020 5:06 PM ______________________________________________________________________   Discussed status with Dr. Ranell Patrick on 07/09/20 at 1000 and received approval for admission today.  Admission Coordinator: Danne Baxter RN MSN with updates by Julious Payer, Audelia Acton, time 1246/Date 07/09/20

## 2020-07-07 NOTE — Care Management Important Message (Signed)
Important Message  Patient Details  Name: Tyler Richards MRN: [id number] Date of Birth: June 02, 1951   Medicare Important Message Given:  Yes     Orbie Pyo 07/07/2020, 3:07 PM

## 2020-07-07 NOTE — Progress Notes (Addendum)
STROKE TEAM PROGRESS NOTE   SUBJECTIVE (INTERVAL HISTORY)  He has now been transferred to neurology floor bed. He continues to be bradycardic with heart rate into the 30s when asleep but and awake in the low 50s.  He still not been mobilized out of bed due to hemodynamic instability.  His hematocrit is stable.    Neurological exam unchanged.  Hematocrit stable at 27.9  OBJECTIVE Temp:  [98 F (36.7 C)-99.1 F (37.3 C)] 98.2 F (36.8 C) (10/12 1121) Pulse Rate:  [41-61] 41 (10/12 1121) Cardiac Rhythm: Normal sinus rhythm (10/12 0834) Resp:  [17-19] 18 (10/12 1121) BP: (99-114)/(49-101) 107/51 (10/12 1121) SpO2:  [96 %-99 %] 99 % (10/12 1121)  Recent Labs  Lab 07/02/20 0424 07/02/20 0424 07/03/20 0311 07/03/20 0311 07/05/20 0314 07/06/20 0417 07/07/20 0950  NA 135  --  135  --  137 137 137  K 3.4*  --  3.5  --  3.3* 3.5 3.6  CL 104  --  105  --  106 106 108  CO2 22  --  21*  --  21* 19* 22  GLUCOSE 103*  --  102*  --  93 95 120*  BUN 11  --  12  --  14 15 11   CREATININE 0.79  --  0.77  --  0.89 0.76 0.69  CALCIUM 8.4*   < > 8.2*   < > 8.0* 8.1* 8.0*  MG  --   --   --   --   --   --  2.1  PHOS  --   --   --   --   --   --  2.5   < > = values in this interval not displayed.   Recent Labs  Lab 07/07/20 0950  AST 105*  ALT 79*  ALKPHOS 134*  BILITOT 0.7  PROT 5.2*  ALBUMIN 1.8*   Recent Labs  Lab 07/02/20 0424 07/02/20 0424 07/02/20 2310 07/03/20 0311 07/05/20 0314 07/06/20 0417 07/07/20 0950  WBC 10.5  --   --  9.8 7.8 10.6* 8.0  NEUTROABS  --   --   --   --  6.0  --  6.3  HGB 10.1*   < > 10.7* 10.3* 9.8* 9.9* 9.4*  HCT 30.1*   < > 31.7* 30.6* 29.1* 29.3* 27.9*  MCV 96.2  --   --  96.2 97.0 98.0 96.2  PLT 287  --   --  336 365 406* 460*   < > = values in this interval not displayed.   Imaging past 24h No results found.  PHYSICAL EXAM    Temp:  [98 F (36.7 C)-99.1 F (37.3 C)] 98.2 F (36.8 C) (10/12 1121) Pulse Rate:  [41-61] 41 (10/12 1121) Resp:   [17-19] 18 (10/12 1121) BP: (99-114)/(49-101) 107/51 (10/12 1121) SpO2:  [96 %-99 %] 99 % (10/12 1121)  General - Well nourished, well developed, not in acute distress.  Ophthalmologic - fundi not visualized due to noncooperation.  Cardiovascular - Regular rate and rhythm.  Neuro - awake alert, eyes open, oriented x 3, no aphasia, able to name and repeat but mild dysarthria, able to follow simple commands. Eyes in right gaze perference position but able to cross midline and left gaze incomplete, left hemianopia and visual neglect, however, able to track bilaterally, PERRL. Left facial droop and decreased eye closure. RUE and RLE at least 4/5. LUE proximal 2-/5 and distal 0/5, LLE 2/5 proximal and 0/5 distally. DTR 1+ and no  babinski. Sensation symmetrical per pt, however, left sensory neglect. Right FTN intact on the right and gait not tested.   ASSESSMENT/PLAN [name] is a 69 y.o. male with history of gout admitted for left-sided weakness, right gaze, left facial droop with fall. No tPA given due to outside window.    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 Evolution R frontoinsular infarcts. R ICA occluded.  Carotid Doppler confirmed right ICA re-occluded  2D Echo EF 50 to 55%  LE venous Doppler no DVT  LDL 93  HgbA1c 5.6  Off Heparin sq at request of GI given BRBPR, SCDsfor VTE prophylaxis    No antithrombotic prior to admission, now on aspirin 81 mg  daily  (off Brilinta given GIB and ICA has re-occluded)  Therapy recommendations:  CIR  Disposition:  Pending   Transfer to floor   CCM signed off ->Dr. Leonie Richards consulted [facility] for ongoing medical mgmt on the floor  Carotid occlusion  CTA head and neck right ICA and MCA occlusion  S/p carotid stenting due to severe athero but repeat carotid Doppler confirmed right ICA re-occluded  On ASA   Off brilinta due to LGIB and stent re-occluded  Hypotension and severe sinus bradycardia Orthostatic hypotension  Likely related to carotid stenting  cards does nor support permanent pacing at this time d/t likely transient due to carotid stent and potential for bacteremia  Had orthostatic episode 10/7 with PT - BP down to 80s on sitting  On Levophed-> turn off -> resumed after orthostatic episode->off  Cardiology on board  On Midodrine 5 tid 10/6->10q8 10/7->decreasing dose to 5 tid 10/11 d/t risk bradycardia - monitor - plan to wean  On florinef 0.1mg   Long term BP goal 130-150 given occluded right ICA  LGIB  BRBPR 07/02/2020  Hgb 11.7->10.1->10.3->9.8->9.9 - drifting down  Off Brilinta, sq heparin (aspirin continued)  GI on board  Currently conservative management   GI standing by for colonoscopy when bradycardia improves and cleared by anesthesia - if urgent need, recommend CTAngio abd/pelvis to help localize - contact GI when pt stable for colon  montior  Fever  TM 102.4  WBC 7.8 ->10.6   CXR NAD  UA + bacteria, +nitrates, >50 WBC  UCx pending   Empiric Rocephin 10/11>> (5 days)    Hyperlipidemia  Home meds: None  LDL 93, goal < 70  Add lipitor 40  Continue statin at discharge  Dysphagia  Passed swallow  On dys 1 and thin liquid  Speech on board  Other Stroke Risk Factors    Other Active Problems  Gout  Hypokalemia 3.6->3.4->3.5 - supplement - resolved  L knee & R elbow pain - warm and red and swollen - suspect gout flare (has had in  past) - uric acid 4.2 - on colchicine and resumed home allopurinol  Hospital day # 8 Try cautious mobilization out of bed if tolerated.  Continue conservative treatment for his GI bleed which appears stable.     Long  discussion with patient and Dr Tyler Richards.   Await transfer to inpatient rehab when medically stable. I have spent a total of  25  minutes with the patient reviewing hospital notes,  test results, labs and examining the patient as well as establishing an assessment and plan that was discussed personally with the patient.  > 50% of time was spent in direct patient care.      Antony Contras, MD      To contact Stroke Continuity provider, please refer to [email or web address] After hours, contact General Neurology

## 2020-07-07 NOTE — Progress Notes (Signed)
PROGRESS NOTE    Tyler Richards  VVO:[id number] DOB: 10-19-1950 DOA: 06/29/2020 PCP: Patient, No Pcp Per   Brief Narrative:  The patient is a 69 year old Caucasian male who presented on 06/29/2020 with left-sided weakness.  Found to have an acute CVA and underwent a M1 thrombectomy and ICA stent placement.  Initially the stent was treated with dual antiplatelet therapy and a to a/3B inhibitor but subsequently developed an intracranial hemorrhage and had postprocedural difficulties with intermittent bradycardia.  Subsequently he also had a small episode of red blood per rectum overnight from 10/7-10/8.  Gastroenterology, interventional radiology, cardiology have all been consulted and neurology is following as well.  Patient was intubated on 06/29/2024 and extubated on 06/30/2020.  He is now improved and awake and talking however complaint of gout pain in the left leg.  Remains significantly weak and he was transferred to [facility] service on 07/07/2020.  T OT recommending CIR and CIR evaluation is initiated however he needs to be stable and his heart rate not drop prior to a safe discharge disposition.   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  Lower extremity venous duplex showed no DVT -Currently getting SCDs for VT prophylaxis given that heparin subcu was stopped given by her report per rectum.  Currently now just on aspirin monotherapy and has been off of Brilinta given a GI bleed and ICA is then subsequently a little clouded -Patient was transferred to the floor and will continue medical management and anticipate discharging to CIR  Right carotid occlusion -CTA of the head neck initially showed a right ICA and MCA occlusion -He was status post carotid stenting for severe atherosclerosis we will repeat carotid Doppler is confirmed that the patient has a right ICA that has been reoccluded. -His aspirin and Brilinta were initiated however Brilinta was stopped due to lower GI bleeding and then subsequently the stent reoccluded  Severe sinus bradycardia and cardiology feels that this is consistent with hyper vagotonia -He still has bradycardia but it has not been symptomatic in the past 24 hours. -Continue PT OT if able -appreciate EP/cards recs and heart rate keeps dropping and they are recommending Florinef and midodrine -Florinef 0.1 mg p.o. daily has been started and his midodrine 5 mg 3 times daily started 07/01/2020 and increased to 10 mg every 8 hours 07/02/2020 and now because her rates are improving cardiology is weaning midodrine and recommending continue to avoid nodal blocking agents   Syncope  -When attempting to transfer from bed to chair 10/7 patient has a witnessed 11sec pause that resulted in a syncopal episode.   He continues on Florinef and weaning midodrine and have weaned back down to  5 mg 3 times daily on 07/06/2020 to the risk of bradycardia -Avoid AV nodal blockades  -EP cardiology is following and have now signed off the case  GIB  - Seen with bright  red bloody bowel movement 10/7. Had no further GI bleeding we are holding off on any kind of endoscopic evaluation until it is clear that we are not going to have a symptomatic issues with bradycardia. -Gastroenterology consulted  -Will need to have endoscopy once stabilized and no longer bradycardic and cleared by anesthesia -Continue to monitor for recurrent signs of bleeding; hemoglobin/hematocrit has been relatively stable last few days now 9.4/27.9 -trend hgb and GI recommends contacting them when he is stable  Hypokalemia -No longer hypokalemic and potassium was 3.6 -Continue to monitor and replete as necessary  -repeat CMP in a.m.  Abnormal LFTs -AST is 105 and ALT is 79 from admission an AST was 36 and ALT was 17  -continue to monitor and trend and will obtain a right upper quadrant ultrasound and a acute hepatitis panel -Repeat CMP in a.m.  L knee and R elbow pain:  -Suspect gout flare - warm and red and swollen  -pt states has previously had gout flares in these locations.  -check uric acid and was 4.2 -start colchicine and resume home allopurinol.  -May need steroids but will hold off given lower GI bleeding and will avoid NSAIDs as well  Thrombocytosis -Likely reactive could be in the setting of UTI -Patient's platelet count has gone from 287 and is trended up to 460 -Continue monitor and trend -repeat CBC in a.m.  HLD -Lipid Panel showed a total cholesterol/HDL ratio of 6.5, cholesterol level 149, HDL 23, LDL of 93, triglycerides of 168, VLDL 33 -Continue atorvastatin 40 mg p.o. nightly  Recent fever the setting of Enterobacter Asburiae UTI -T-max of 102.4 but could be secondary to gout -WBC trended up to 10.6 yesterday but is now down to 8.0 and chest x-ray showed no acute cardiopulmonary disease but did show that the heart was at the upper side of normal -Urinalysis showed hazy appearance with moderate hemoglobin, 20 ketones, large leukocytes, positive nitrites,  many bacteria, greater than 50 WBCs -Urine culture showing Enterobacter Asburiae with Sensitivities Pending -On IV Ceftriaxone but will change to IV Cefepime for now but may need to change further   DVT prophylaxis: SCDs Code Status: FULL CODE Family Communication: Discussed with wife at bedside Disposition Plan: Pending Clearance by Neurology and Cardiology to go to CIR  Status is: Inpatient  Remains inpatient appropriate because:Unsafe d/c plan and Inpatient level of care appropriate due to severity of illness   Dispo: The patient is from: Home              Anticipated d/c is to: CIR              Anticipated d/c date is: 2 days              Patient currently is not medically stable to d/c.  Consultants:   Interventional radiology  PCCM transfer  EP cardiology  Neurology  Gastroenterology  Procedures:  ETT 10/4 > 10/5  Significant Diagnostic Tests:  CT / CTA head 10/4 >  LVO R ICA / prox MCA  MRi brain 10/5 > Acute infarct at the right basal ganglia with nonprogressive hemorrhage when correlated with prior CT. Less extensive patchy acute cortical infarct in the right MCA territory. The areas of acute infarction correlate well with pretreatment CTP. Right ICA  occlusion in the neck with normalized flow void at the supraclinoid segment.  CT head 10/5 >  3.9 x 3.0 x 3.0 cm hyperdense region in the right posterior basal ganglia most consistent with parenchymal hematoma, given small internal fluid levels. Mild mass effect and surrounding edema. No idline shift. Chronic ischemic changes in the right posterior frontal region as seen previously. Echo 10/4 >  Left ventricular ejection fraction, by estimation, is 50 to 33%, grade 1 diastolic dysfunction  Micro Data:  COVID 10/4 > neg. Flu 10/4 > neg.   Antimicrobials:  Anti-infectives (From admission, onward)   Start     Dose/Rate Route Frequency Ordered Stop   07/06/20 1400  cefTRIAXone (ROCEPHIN) 1 g in sodium  chloride 0.9 % 100 mL IVPB        1 g 200 mL/hr over 30 Minutes Intravenous Every 24 hours 07/06/20 1239 07/11/20 1359   06/29/20 0942  ceFAZolin (ANCEF) 2-4 GM/100ML-% IVPB       Note to Pharmacy: Arlean Hopping   : cabinet override      06/29/20 0942 06/29/20 1938        Subjective: Seen and examined at bedside he is awake and alert and complaining of some left knee pain.  No nausea or vomiting.  Denies any chest pain, lightheadedness or dizziness.  No other concerns or points at this time and wife at bedside  Objective: Vitals:   07/07/20 0800 07/07/20 0805 07/07/20 1121 07/07/20 1558  BP:  (!) 111/57 (!) 107/51 129/71  Pulse:  (!) 51 (!) 41 (!) 55  Resp: 17 18 18 18   Temp:  98 F (36.7 C) 98.2 F (36.8 C) 99 F (37.2 C)  TempSrc:  Oral Oral Oral  SpO2:  98% 99% 99%  Weight:      Height:        Intake/Output Summary (Last 24 hours) at 07/07/2020 1930 Last data filed at 07/07/2020 0422 Gross per 24 hour  Intake --  Output 900 ml  Net -900 ml   Filed Weights   06/29/20 1730  Weight: 85.1 kg   Examination: Physical Exam:  Constitutional: Thin Caucasian male in NAD and appears calm but does appear little uncomfortable complaining of some knee pain Eyes: Lids and conjunctivae normal, sclerae anicteric  ENMT: External Ears, Nose appear normal. Grossly normal hearing.  Neck: Appears normal, supple, no cervical masses, normal ROM, no appreciable thyromegaly; no JVD Respiratory: Diminished to auscultation bilaterally, no wheezing, rales, rhonchi or crackles. Normal respiratory effort and patient is not tachypenic. No accessory muscle use.  Unlabored breathing Cardiovascular: Slightly bradycardic rate but regular rhythm, no murmurs / rubs / gallops. S1 and S2 auscultated.  Has some lower extremity swelling bilaterally and pedal edema Abdomen: Soft, non-tender, non-distended. Bowel sounds positive.  GU: Deferred. Musculoskeletal: No clubbing / cyanosis of digits/nails. 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 No induration; Warm and dry.  Neurologic: CN 2-12 grossly intact with no focal deficits. Romberg sign and cerebellar reflexes not assessed.  Psychiatric: Normal judgment and insight. Alert and oriented x 3. Normal mood and appropriate affect.   Data Reviewed: I have personally reviewed following labs and imaging studies  CBC: Recent Labs  Lab 07/02/20 0424 07/02/20 0424 07/02/20 2310 07/03/20 0311 07/05/20 0314 07/06/20 0417 07/07/20 0950  WBC 10.5  --   --  9.8 7.8 10.6* 8.0  NEUTROABS  --   --   --   --  6.0  --  6.3  HGB 10.1*   < > 10.7* 10.3* 9.8* 9.9* 9.4*  HCT 30.1*   < > 31.7* 30.6* 29.1* 29.3* 27.9*  MCV 96.2  --   --  96.2 97.0 98.0 96.2  PLT 287  --   --  336 365 406* 460*   < > = values in this interval not displayed.   Basic Metabolic Panel: Recent Labs  Lab 07/02/20 0424 07/03/20 0311 07/05/20 0314 07/06/20 0417 07/07/20 0950  NA 135 135 137 137 137  K 3.4* 3.5 3.3* 3.5 3.6  CL 104 105 106 106 108  CO2 22 21* 21* 19* 22  GLUCOSE 103* 102* 93 95 120*  BUN 11 12 14 15 11   CREATININE 0.79 0.77 0.89 0.76 0.69  CALCIUM 8.4* 8.2* 8.0* 8.1* 8.0*  MG  --   --   --   --  2.1  PHOS  --   --   --   --  2.5   GFR: Estimated Creatinine Clearance: 99.9 mL/min (by C-G formula based on SCr of 0.69 mg/dL). Liver Function Tests: Recent Labs  Lab 07/07/20 0950  AST 105*  ALT 79*  ALKPHOS 134*  BILITOT 0.7  PROT 5.2*  ALBUMIN 1.8*   No results for input(s): LIPASE, AMYLASE in the last 168 hours. No results for input(s): AMMONIA in the last 168 hours. Coagulation Profile: No results for input(s): INR, PROTIME in the last 168 hours. Cardiac Enzymes: No results for input(s): CKTOTAL, CKMB, CKMBINDEX, TROPONINI in the last 168 hours. BNP (last 3  results) No results for input(s): PROBNP in the last 8760 hours. HbA1C: No results for input(s): HGBA1C in the last 72 hours. CBG: No results for input(s): GLUCAP in the last 168 hours. Lipid Profile: No results for input(s): CHOL, HDL, LDLCALC, TRIG, CHOLHDL, LDLDIRECT in the last 72 hours. Thyroid Function Tests: Recent Labs    07/05/20 0314  TSH 1.012  FREET4 1.27*   Anemia Panel: No results for input(s): VITAMINB12, FOLATE, FERRITIN, TIBC, IRON, RETICCTPCT in the last 72 hours. Sepsis Labs: No results for input(s): PROCALCITON, LATICACIDVEN in the last 168 hours.  Recent Results (from the past 240 hour(s))  Respiratory Panel by RT PCR (Flu A&B, Covid) -     Status: None   Collection Time: 06/29/20 10:01 AM  Result Value Ref Range Status   SARS Coronavirus 2 by RT PCR NEGATIVE NEGATIVE Final    Comment: (NOTE) SARS-CoV-2 target nucleic acids are NOT DETECTED.  The SARS-CoV-2 RNA is generally detectable in upper respiratoy specimens during the acute phase of infection. The lowest concentration of SARS-CoV-2 viral copies this assay can detect is 131 copies/mL. A negative result does not preclude SARS-Cov-2 infection and should not be used as the sole basis for treatment or other patient management decisions. A negative result may occur with  improper specimen collection/handling, submission of specimen other than nasopharyngeal swab, presence of viral mutation(s) within the areas targeted by this assay, and inadequate number of viral copies (<131 copies/mL). A negative result must be combined with clinical observations, patient history, and epidemiological information. The expected result is Negative.  Fact Sheet for Patients:  PinkCheek.be  Fact Sheet for Healthcare Providers:  GravelBags.it  This test is no t yet approved or cleared by the [place] FDA and  has been authorized for detection and/or  diagnosis of SARS-CoV-2 by FDA  under an Emergency Use Authorization (EUA). This EUA will remain  in effect (meaning this test can be used) for the duration of the COVID-19 declaration under Section 564(b)(1) of the Act, 21 U.S.C. section 360bbb-3(b)(1), unless the authorization is terminated or revoked sooner.     Influenza A by PCR NEGATIVE NEGATIVE Final   Influenza B by PCR NEGATIVE NEGATIVE Final    Comment: (NOTE) The Xpert Xpress SARS-CoV-2/FLU/RSV assay is intended as an aid in  the diagnosis of influenza from Nasopharyngeal swab specimens and  should not be used as a sole basis for treatment. Nasal washings and  aspirates are unacceptable for Xpert Xpress SARS-CoV-2/FLU/RSV  testing.  Fact Sheet for Patients: PinkCheek.be  Fact Sheet for Healthcare Providers: GravelBags.it  This test is not yet approved or cleared by the [place] FDA and  has been authorized for detection and/or diagnosis of SARS-CoV-2 by  FDA under an Emergency Use Authorization (EUA). This EUA will remain  in effect (meaning this test can be used) for the duration of the  Covid-19 declaration under Section 564(b)(1) of the Act, 21  U.S.C. section 360bbb-3(b)(1), unless the authorization is  terminated or revoked. Performed at [facility], [place] [address] [place], [place] [address]   MRSA PCR Screening     Status: None   Collection Time: 06/29/20  5:28 PM   Specimen: Nasal Mucosa; Nasopharyngeal  Result Value Ref Range Status   MRSA by PCR NEGATIVE NEGATIVE Final    Comment:        The GeneXpert MRSA Assay (FDA approved for NASAL specimens only), is one component of a comprehensive MRSA colonization surveillance program. It is not intended to diagnose MRSA infection nor to guide or monitor treatment for MRSA infections. Performed at [facility], [place] [address] [place], [place] [address]   Urine Culture     Status:  Abnormal (Preliminary result)   Collection Time: 07/06/20  1:26 PM   Specimen: Urine, Random  Result Value Ref Range Status   Specimen Description URINE, RANDOM  Final   Special Requests NONE  Final   Culture (A)  Final    >=100,000 COLONIES/mL ENTEROBACTER ASBURIAE SUSCEPTIBILITIES TO FOLLOW Performed at [facility], [place] [address] [place], [place] [address]    Report Status PENDING  Incomplete     RN Pressure Injury Documentation:     Estimated body mass index is 24.09 kg/m as calculated from the following:   Height as of this encounter: 6\' 2"  (1.88 m).   Weight as of this encounter: 85.1 kg.  Malnutrition Type:  Nutrition Problem: Inadequate oral intake Etiology: decreased appetite, acute illness   Malnutrition Characteristics:  Signs/Symptoms: per patient/family report   Nutrition Interventions:  Interventions: Ensure Enlive (each supplement provides 350kcal and 20 grams of protein)    Radiology Studies: DG CHEST PORT 1 VIEW  Result Date: 07/06/2020 CLINICAL DATA:  Fever EXAM: PORTABLE CHEST 1 VIEW COMPARISON:  June 30, 2020 FINDINGS: Endotracheal tube and nasogastric tube no longer present. There is no edema or airspace opacity. Heart size is upper normal with pulmonary vascularity normal. No adenopathy. No bone lesions. IMPRESSION: No edema or airspace opacity.  Heart upper normal in size. Electronically Signed   By: Lowella Grip III M.D.   On: 07/06/2020 10:57   Scheduled Meds: .  stroke: mapping our early stages of recovery book   Does not apply Once  . aspirin  81 mg Oral Daily   Or  .  aspirin  81 mg Per Tube Daily  . atorvastatin  40 mg Oral Daily  . chlorhexidine  15 mL Mouth Rinse BID  . Chlorhexidine Gluconate Cloth  6 each Topical Daily  . colchicine  0.6 mg Oral BID  . docusate  100 mg Oral BID  . feeding supplement (ENSURE ENLIVE)  237 mL Oral TID BM  . fludrocortisone  0.1 mg Oral Daily  . influenza vaccine adjuvanted  0.5 mL  Intramuscular Tomorrow-1000  . mouth rinse  15 mL Mouth Rinse q12n4p  . midodrine  5 mg Oral Q8H  . pantoprazole  40 mg Oral Daily  . pneumococcal 23 valent vaccine  0.5 mL Intramuscular Tomorrow-1000  . polyethylene glycol  17 g Oral Daily   Continuous Infusions: . sodium chloride 250 mL (07/06/20 1725)  . cefTRIAXone (ROCEPHIN)  IV 1 g (07/07/20 1308)    LOS: 8 days   Kerney Elbe, DO [facility] PAGER is on [place]  If 7PM-7AM, please contact night-coverage [email or web address]

## 2020-07-08 ENCOUNTER — Inpatient Hospital Stay (HOSPITAL_COMMUNITY): Payer: Medicare Other

## 2020-07-08 LAB — CBC WITH DIFFERENTIAL/PLATELET
Abs Immature Granulocytes: 0.03 10*3/uL (ref 0.00–0.07)
Basophils Absolute: 0 10*3/uL (ref 0.0–0.1)
Basophils Relative: 1 %
Eosinophils Absolute: 0.1 10*3/uL (ref 0.0–0.5)
Eosinophils Relative: 1 %
HCT: 29.1 % — ABNORMAL LOW (ref 39.0–52.0)
Hemoglobin: 9.8 g/dL — ABNORMAL LOW (ref 13.0–17.0)
Immature Granulocytes: 0 %
Lymphocytes Relative: 16 %
Lymphs Abs: 1.2 10*3/uL (ref 0.7–4.0)
MCH: 32.5 pg (ref 26.0–34.0)
MCHC: 33.7 g/dL (ref 30.0–36.0)
MCV: 96.4 fL (ref 80.0–100.0)
Monocytes Absolute: 0.8 10*3/uL (ref 0.1–1.0)
Monocytes Relative: 11 %
Neutro Abs: 5.6 10*3/uL (ref 1.7–7.7)
Neutrophils Relative %: 71 %
Platelets: 472 10*3/uL — ABNORMAL HIGH (ref 150–400)
RBC: 3.02 MIL/uL — ABNORMAL LOW (ref 4.22–5.81)
RDW: 12.2 % (ref 11.5–15.5)
WBC: 7.8 10*3/uL (ref 4.0–10.5)
nRBC: 0 % (ref 0.0–0.2)

## 2020-07-08 LAB — COMPREHENSIVE METABOLIC PANEL
ALT: 81 U/L — ABNORMAL HIGH (ref 0–44)
AST: 97 U/L — ABNORMAL HIGH (ref 15–41)
Albumin: 1.9 g/dL — ABNORMAL LOW (ref 3.5–5.0)
Alkaline Phosphatase: 144 U/L — ABNORMAL HIGH (ref 38–126)
Anion gap: 8 (ref 5–15)
BUN: 10 mg/dL (ref 8–23)
CO2: 23 mmol/L (ref 22–32)
Calcium: 8 mg/dL — ABNORMAL LOW (ref 8.9–10.3)
Chloride: 107 mmol/L (ref 98–111)
Creatinine, Ser: 0.74 mg/dL (ref 0.61–1.24)
GFR, Estimated: >60 mL/min (ref >60)
Glucose, Bld: 109 mg/dL — ABNORMAL HIGH (ref 70–99)
Potassium: 3.7 mmol/L (ref 3.5–5.1)
Sodium: 138 mmol/L (ref 135–145)
Total Bilirubin: 0.6 mg/dL (ref 0.3–1.2)
Total Protein: 5.6 g/dL — ABNORMAL LOW (ref 6.5–8.1)

## 2020-07-08 LAB — URINE CULTURE: Culture: >=100000 — AB

## 2020-07-08 LAB — HEPATITIS PANEL, ACUTE
HCV Ab: NONREACTIVE
Hep A IgM: NONREACTIVE
Hep B C IgM: NONREACTIVE
Hepatitis B Surface Ag: NONREACTIVE

## 2020-07-08 LAB — MAGNESIUM
Magnesium: 2 mg/dL (ref 1.7–2.4)
Magnesium: 2 mg/dL (ref 1.7–2.4)

## 2020-07-08 LAB — PHOSPHORUS
Phosphorus: 2.9 mg/dL (ref 2.5–4.6)
Phosphorus: 2.2 mg/dL — ABNORMAL LOW (ref 2.5–4.6)

## 2020-07-08 MED ORDER — OSMOLITE 1.5 CAL PO LIQD: 1000.0000 mL | ORAL | Status: DC

## 2020-07-08 MED ORDER — PROSOURCE TF PO LIQD: 45.0000 mL | Freq: Every day | ORAL | Status: DC

## 2020-07-08 MED ORDER — ENSURE ENLIVE PO LIQD
237.0000 mL | Freq: Three times a day (TID) | ORAL | Status: DC
  Administered 2020-07-08 – 2020-07-09 (×2): 237 mL via ORAL

## 2020-07-08 MED ORDER — SULFAMETHOXAZOLE-TRIMETHOPRIM 800-160 MG PO TABS
1.0000 | ORAL_TABLET | Freq: Two times a day (BID) | ORAL | Status: DC
  Administered 2020-07-08 – 2020-07-09 (×2): 1 via ORAL
  Filled 2020-07-08 (×2): qty 1

## 2020-07-08 NOTE — Progress Notes (Signed)
STROKE TEAM PROGRESS NOTE   SUBJECTIVE (INTERVAL HISTORY)  He is lying in bed comfortably.  He is still not able to be mobilized well as he becomes bradycardic and hypotensive and sitting on the edge of the bed.  His wife is at the bedside.    Neurological exam unchanged.  Hematocrit stable at 29.1.  Vital signs stable.  OBJECTIVE Temp:  [98 F (36.7 C)-100.3 F (37.9 C)] 98.2 F (36.8 C) (10/13 0806) Pulse Rate:  [41-64] 64 (10/13 0806) Cardiac Rhythm: Sinus bradycardia (10/13 0815) Resp:  [14-18] 14 (10/13 0806) BP: (107-151)/(51-81) 114/81 (10/13 0806) SpO2:  [97 %-100 %] 98 % (10/13 0806)  Recent Labs  Lab 07/03/20 0311 07/03/20 0311 07/05/20 0314 07/05/20 0314 07/06/20 0417 07/07/20 0950 07/08/20 0147  NA 135  --  137  --  137 137 138  K 3.5  --  3.3*  --  3.5 3.6 3.7  CL 105  --  106  --  106 108 107  CO2 21*  --  21*  --  19* 22 23  GLUCOSE 102*  --  93  --  95 120* 109*  BUN 12  --  14  --  15 11 10   CREATININE 0.77  --  0.89  --  0.76 0.69 0.74  CALCIUM 8.2*   < > 8.0*   < > 8.1* 8.0* 8.0*  MG  --   --   --   --   --  2.1 2.0  PHOS  --   --   --   --   --  2.5 2.9   < > = values in this interval not displayed.   Recent Labs  Lab 07/07/20 0950 07/08/20 0147  AST 105* 97*  ALT 79* 81*  ALKPHOS 134* 144*  BILITOT 0.7 0.6  PROT 5.2* 5.6*  ALBUMIN 1.8* 1.9*   Recent Labs  Lab 07/03/20 0311 07/05/20 0314 07/06/20 0417 07/07/20 0950 07/08/20 0147  WBC 9.8 7.8 10.6* 8.0 7.8  NEUTROABS  --  6.0  --  6.3 5.6  HGB 10.3* 9.8* 9.9* 9.4* 9.8*  HCT 30.6* 29.1* 29.3* 27.9* 29.1*  MCV 96.2 97.0 98.0 96.2 96.4  PLT 336 365 406* 460* 472*   Imaging past 24h US Abdomen Limited RUQ  Result Date: 07/08/2020 CLINICAL DATA:  Abnormal liver function tests EXAM: ULTRASOUND ABDOMEN LIMITED RIGHT UPPER QUADRANT COMPARISON:  None. FINDINGS: Gallbladder: No gallstones or wall thickening visualized. No sonographic Murphy sign noted by sonographer. Common bile duct:  Diameter: 4 mm in proximal diameter Liver: No focal lesion identified. Within normal limits in parenchymal echogenicity. Portal vein is patent on color Doppler imaging with normal direction of blood flow towards the liver. Other: None. IMPRESSION: Normal examination Electronically Signed   By: Fidela Salisbury MD   On: 07/08/2020 02:48    PHYSICAL EXAM    Temp:  [98 F (36.7 C)-100.3 F (37.9 C)] 98.2 F (36.8 C) (10/13 0806) Pulse Rate:  [41-64] 64 (10/13 0806) Resp:  [14-18] 14 (10/13 0806) BP: (107-151)/(51-81) 114/81 (10/13 0806) SpO2:  [97 %-100 %] 98 % (10/13 0806)  General -obese elderly Caucasian male not in acute distress.  Ophthalmologic - fundi not visualized due to noncooperation.  Cardiovascular - Regular rate and rhythm.  Neuro - awake alert, eyes open, oriented x 3, no aphasia, able to name and repeat but mild dysarthria, able to follow simple commands. Eyes in right gaze perference position but able to cross midline and left gaze incomplete,  left hemianopia and visual neglect, however, able to track bilaterally, PERRL. Left facial droop and decreased eye closure. RUE and RLE at least 4/5. LUE proximal 2-/5 and distal 0/5, LLE 2/5 proximal and 0/5 distally. DTR 1+ and no babinski. Sensation symmetrical per pt, however, left sensory neglect. Right FTN intact on the right and gait not tested.   ASSESSMENT/PLAN Mr. DEANGLEO PASSAGE is a 69 y.o. male with history of gout admitted for left-sided weakness, right gaze, left facial droop with fall. No tPA given due to outside window.    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 Evolution R frontoinsular infarcts. R ICA occluded.  Carotid Doppler confirmed right ICA re-occluded  2D Echo EF 50 to 55%  LE venous Doppler no DVT  LDL 93  HgbA1c 5.6  Off Heparin sq at request of GI given BRBPR, SCDsfor VTE prophylaxis    No antithrombotic prior to admission, now on aspirin 81 mg daily  (off Brilinta given GIB and ICA has re-occluded)  Therapy recommendations:  CIR  Disposition:  Pending   Transfer to floor   CCM signed off ->Dr. Leonie Man consulted [facility] for ongoing medical mgmt on the floor  Carotid occlusion  CTA head and neck right ICA and MCA occlusion  S/p carotid stenting due to severe athero but repeat carotid Doppler confirmed right ICA re-occluded  On ASA   Off brilinta due to LGIB and stent re-occluded  Hypotension and severe sinus bradycardia Orthostatic hypotension  Likely related to carotid stenting  cards does nor support permanent pacing at this time d/t likely transient due to carotid stent and potential for bacteremia  Had orthostatic episode 10/7 with PT - BP down to 80s on sitting  On Levophed-> turn off -> resumed after orthostatic episode->off  Cardiology on board  On Midodrine 5 tid 10/6->10q8 10/7->decreasing dose to 5 tid 10/11 d/t risk bradycardia - monitor - plan to wean  On florinef 0.1mg   Long term BP goal 130-150 given occluded right ICA  LGIB  BRBPR 07/02/2020  Hgb 11.7->10.1->10.3->9.8->9.9 - drifting down  Off Brilinta, sq heparin (aspirin continued)  GI on board  Currently conservative management   GI standing by for colonoscopy when bradycardia improves and cleared by anesthesia - if urgent need, recommend CTAngio abd/pelvis to help localize - contact GI when pt stable for colon  montior  Fever  TM 102.4  WBC 7.8 ->10.6    CXR NAD  UA + bacteria, +nitrates, >50 WBC  UCx pending   Empiric Rocephin 10/11>> (5 days)    Hyperlipidemia  Home meds: None  LDL 93, goal < 70  Add lipitor 40  Continue statin at discharge  Dysphagia  Passed swallow  On dys 1 and thin liquid  Speech on board  Other Stroke Risk Factors    Other Active Problems  Gout  Hypokalemia 3.6->3.4->3.5 - supplement - resolved  L knee & R elbow pain - warm and red and swollen - suspect gout flare (has had in past) -  uric acid 4.2 - on colchicine and resumed home allopurinol  Hospital day # 9 Try cautious mobilization out of bed if tolerated.  Continue conservative treatment for his GI bleed which appears stable.     Long discussion with patient , his wife and   Dr Doristine Bosworth.   Await transfer to inpatient rehab when medically stable. I have spent a total of  15  minutes with the patient reviewing hospital notes,  test results, labs and examining the patient as well as establishing an assessment and plan that was discussed personally with the patient.  > 50% of time was spent in direct patient care.      Antony Contras, MD      To contact Stroke Continuity provider, please refer to [email or web address] After hours, contact General Neurology

## 2020-07-08 NOTE — Progress Notes (Addendum)
Nutrition Follow-up  DOCUMENTATION CODES:   Not applicable  INTERVENTION:    Monitor magnesium, potassium, and phosphorus daily for at least 3 days, MD to replete as needed, as pt is at risk for refeeding syndrome given prolonged poor PO intake.  Once Cortrak is placed and ready for use:  -Begin Osmolite 1.5 @ 91m/hr, advance 159mhr Q8H until goal rate of 6052mr is reached.   -24m64mosource TF daily  TF regimen will provide 2200 kcals, 101 grams protein, 1097ml78me water.   ----------------------------------------------------------------------------------------------------------------------------- Addendum 07/08/20 @ 1113 -- Pt refused Cortrak placement today and feels he can eat enough to meet his needs. Will order 48 hour calorie count, Ensure Enlive po TID (each supplement provides 350 kcal and 20 grams of protein), and Magic cup TID with meals  (each supplement provides 290 kcal and 9 grams of protein). If pt's po intake is not sufficient to meet his needs Friday, will proceed with Cortrak placement as described above. MD and RN made aware of plan.     NUTRITION DIAGNOSIS:   Inadequate oral intake related to decreased appetite, acute illness as evidenced by per patient/family report.  ongoing  GOAL:   Patient will meet greater than or equal to 90% of their needs  Not met  MONITOR:   PO intake, Supplement acceptance, Labs, Weight trends  REASON FOR ASSESSMENT:   Consult Enteral/tube feeding initiation and management  ASSESSMENT:   69 yo6ale admitted with R MCA CVA requiring revascularization and R ICA stent assisted angioplasty and revascularization, GI bleed. No PMH  Per MD, pt remains significantly weak and is being recommended to d/c to CIR.   [name] PO intake has been minimal with only 0-10% meal completion documented. Per RN, pt consumed only 6 bites of breakfast this morning with a few sips of coffee. Pt has not been consuming supplements well. Given pt's  prolonged poor po intake, recommend pt receive TF via Cortrak. Discussed with MD. Orders placed. Of note, pt is at risk for refeeding syndrome.   UOP: 1350ml 69mhours I/O: -1,013ml s57m admit  Labs reviewed.  Medications: protonix, miralax, colace IVF: NS @ 75ml/hr7mDiet Order:   Diet Order            DIET - DYS 1 Room service appropriate? Yes with Assist; Fluid consistency: Thin  Diet effective now                 EDUCATION NEEDS:   Not appropriate for education at this time  Skin:  Skin Assessment: Skin Integrity Issues: Skin Integrity Issues:: Incisions Incisions: R groin  Last BM:  10/8  Height:   Ht Readings from Last 1 Encounters:  06/29/20 _0  (1.88 m)    Weight:   Wt Readings from Last 1 Encounters:  06/29/20 85.1 kg    BMI:  Body mass index is 24.09 kg/m.  Estimated Nutritional Needs:   Kcal:  2000-2200 kcals  Protein:  100-115 g  Fluid:  >/= 2 L    Callin Ashe ALarkin Ina, LDN RD pager number and weekend/on-call pager number located in [email or web address]

## 2020-07-08 NOTE — Progress Notes (Signed)
Inpatient Rehabilitation Admissions Coordinator  Discussed with Dr. Doristine Bosworth. We await to see how patient does with therapy today with his HR issues. If tolerates therapy well, would look to admit to CIR on Thursday. I met with patient and his wife at bedside and they are aware.  Danne Baxter, RN, MSN Rehab Admissions Coordinator [phone] 07/08/2020 1:12 PM

## 2020-07-08 NOTE — Progress Notes (Signed)
PROGRESS NOTE    Tyler Richards  EVO:[id number] DOB: [birth year] DOA: 06/29/2020 PCP: Patient, No Pcp Per   Brief Narrative:  The patient is a 69 year old Caucasian male who presented on 06/29/2020 with left-sided weakness.  Found to have an acute CVA and underwent a M1 thrombectomy and ICA stent placement.  Initially the stent was treated with dual antiplatelet therapy and a to a/3B inhibitor but subsequently developed an intracranial hemorrhage and had postprocedural difficulties with intermittent bradycardia.  Subsequently he also had a small episode of red blood per rectum overnight from 10/7-10/8.  Gastroenterology, interventional radiology, cardiology have all been consulted and neurology is following as well.  Patient was intubated on 06/29/2024 and extubated on 06/30/2020.  He is now improved and awake and talking however complaint of gout pain in the left leg.  Remains significantly weak and he was transferred to [facility] service on 07/07/2020.  T OT recommending CIR and CIR evaluation is initiated however he needs to be stable and his heart rate not drop prior to a safe discharge disposition.   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  Lower extremity venous duplex showed no DVT -Currently getting SCDs for VT prophylaxis given that heparin subcu was stopped given by her report per rectum.  Currently now just on aspirin monotherapy and has been off of Brilinta given a GI bleed and ICA is then subsequently a little clouded -Patient was transferred to the floor and will continue medical management and anticipate discharging to CIR.  Severe sinus bradycardia:  -Florinef 0.1 mg p.o. daily has been started and his midodrine 5 mg 3 times daily started 07/01/2020 and increased to 10 mg every 8 hours 07/02/2020 and now because her rates are improving cardiology is weaning midodrine and recommending continue to avoid nodal blocking agents.  He is doing much better today.  Did very well with physical therapy without having any issues.  Appreciate cardiology and EPL.   Syncope  -When attempting to transfer from bed to chair 10/7 patient has a witnessed 11sec pause that resulted in a syncopal episode.   He continues on Florinef and weaning midodrine and have weaned back down to 5 mg 3 times daily on 07/06/2020 to the risk of bradycardia -Avoid AV nodal blockades  -EP cardiology has now signed off the case  GIB  - Seen with bright red bloody bowel movement 10/7. Had no further GI bleeding we are holding off on any kind of endoscopic evaluation until it is clear that we are not going to have a symptomatic issues with bradycardia. -Gastroenterology consulted  -Will need to have endoscopy once stabilized and no longer bradycardic and cleared  by anesthesia -Continue to monitor for recurrent signs of bleeding; hemoglobin/hematocrit has been relatively stable last few days now 9.4/27.9 -trend hgb and GI recommends contacting them when he is stable.  I spoke to Azucena Freed,  PA with [facility] and looking at the chart, she told me that patient can be discharged to CIR now that he is stable and does not need any scopes while inpatient and should follow-up with his primary GI as an outpatient for dose scopes however if he happens to have more GI bleed or drop in hemoglobin while in CIR, [facility] should be contacted.  Hypokalemia Resolved.  Abnormal LFTs AST slightly elevated then ALT.  Overall stable from yesterday.  Viral hepatitis panel and ultrasound abdomen negative.  L knee and R elbow pain:  Has been started on colchicine.  He did not complain of any pain to me today.  Thrombocytosis Reactive and stable.  HLD -Lipid Panel showed a total cholesterol/HDL ratio of 6.5, cholesterol level 149, HDL 23, LDL of 93, triglycerides of 168, VLDL 33 -Continue atorvastatin 40 mg p.o. nightly  Recent fever the setting of Enterobacter Asburiae UTI Switch IV Rocephin to Bactrim DS p.o.  DVT prophylaxis: SCDs Code Status: FULL CODE Family Communication: Discussed with wife at bedside Disposition Plan: Pending Clearance by Neurology and Cardiology to go to CIR, likely tomorrow.  Status is: Inpatient  Remains inpatient appropriate because:Unsafe d/c plan and Inpatient level of care appropriate due to severity of illness   Dispo: The patient is from: Home              Anticipated d/c is to: CIR              Anticipated d/c date is: 1 day              Patient currently is not medically stable to d/c.  Consultants:   Interventional radiology  PCCM transfer  EP cardiology  Neurology  Gastroenterology  Procedures:  ETT 10/4 > 10/5  Significant Diagnostic Tests:  CT / CTA head 10/4 >  LVO R ICA / prox MCA  MRi brain 10/5 > Acute infarct at the right basal ganglia with nonprogressive hemorrhage when correlated with prior CT. Less extensive patchy acute cortical infarct in the right MCA territory. The areas of acute infarction correlate well with pretreatment  CTP. 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  CT head 10/5 >  3.9 x 3.0 x 3.0 cm hyperdense region in the right posterior basal ganglia most consistent with parenchymal hematoma, given small internal fluid levels. Mild mass effect and surrounding edema. No idline shift. Chronic ischemic changes in the right posterior frontal region as seen previously. Echo 10/4 >  Left ventricular ejection fraction, by estimation, is 50 to 23%, grade 1 diastolic dysfunction  Micro Data:  COVID 10/4 > neg. Flu 10/4 > neg.   Antimicrobials:  Anti-infectives (From admission, onward)   Start     Dose/Rate Route Frequency Ordered Stop   07/07/20 2030  ceFEPIme (MAXIPIME) 2 g in sodium chloride 0.9 % 100 mL IVPB        2 g 200 mL/hr over 30 Minutes Intravenous Every 12 hours 07/07/20 2001     07/06/20 1400  cefTRIAXone (ROCEPHIN) 1 g in sodium chloride 0.9 % 100 mL IVPB  Status:  Discontinued        1 g 200 mL/hr over 30 Minutes Intravenous Every 24 hours 07/06/20 1239 07/07/20 2001  06/29/20 0942  ceFAZolin (ANCEF) 2-4 GM/100ML-% IVPB       Note to Pharmacy: Arlean Hopping   : cabinet override      06/29/20 0942 06/29/20 1938        Subjective: Seen and examined.  Wife at the bedside.  Patient has no complaints.  Has obvious left facial droop but the speech is pretty much comprehensible and clear.  Objective: Vitals:   07/07/20 2315 07/08/20 0355 07/08/20 0750 07/08/20 0806  BP: (!) 111/55 139/71 (!) 117/59 114/81  Pulse: (!) 51 (!) 58 (!) 54 64  Resp: 18 18 18 14   Temp: 98 F (36.7 C) 98 F (36.7 C) 99.1 F (37.3 C) 98.2 F (36.8 C)  TempSrc:   Oral Oral  SpO2: 100% 99% 98% 98%  Weight:      Height:        Intake/Output Summary (Last 24 hours) at 07/08/2020 1100 Last data filed at 07/08/2020 0807 Gross per 24 hour  Intake --  Output 1350 ml  Net -1350 ml   Filed Weights   06/29/20 1730  Weight: 85.1 kg   Examination: Physical  Exam:  General exam: Appears calm and comfortable  Respiratory system: Clear to auscultation. Respiratory effort normal. Cardiovascular system: S1 & S2 heard, RRR. No JVD, murmurs, rubs, gallops or clicks. No pedal edema. Gastrointestinal system: Abdomen is nondistended, soft and nontender. No organomegaly or masses felt. Normal bowel sounds heard. Central nervous system: Alert and oriented.  Power in right upper extremity and lower extremity 4/5 but left upper and lower extremity 2/5. Skin: No rashes, lesions or ulcers.   Data Reviewed: I have personally reviewed following labs and imaging studies  CBC: Recent Labs  Lab 07/03/20 0311 07/05/20 0314 07/06/20 0417 07/07/20 0950 07/08/20 0147  WBC 9.8 7.8 10.6* 8.0 7.8  NEUTROABS  --  6.0  --  6.3 5.6  HGB 10.3* 9.8* 9.9* 9.4* 9.8*  HCT 30.6* 29.1* 29.3* 27.9* 29.1*  MCV 96.2 97.0 98.0 96.2 96.4  PLT 336 365 406* 460* 629*   Basic Metabolic Panel: Recent Labs  Lab 07/03/20 0311 07/05/20 0314 07/06/20 0417 07/07/20 0950 07/08/20 0147  NA 135 137 137 137 138  K 3.5 3.3* 3.5 3.6 3.7  CL 105 106 106 108 107  CO2 21* 21* 19* 22 23  GLUCOSE 102* 93 95 120* 109*  BUN 12 14 15 11 10   CREATININE 0.77 0.89 0.76 0.69 0.74  CALCIUM 8.2* 8.0* 8.1* 8.0* 8.0*  MG  --   --   --  2.1 2.0  PHOS  --   --   --  2.5 2.9   GFR: Estimated Creatinine Clearance: 99.9 mL/min (by C-G formula based on SCr of 0.74 mg/dL). Liver Function Tests: Recent Labs  Lab 07/07/20 0950 07/08/20 0147  AST 105* 97*  ALT 79* 81*  ALKPHOS 134* 144*  BILITOT 0.7 0.6  PROT 5.2* 5.6*  ALBUMIN 1.8* 1.9*   No results for input(s): LIPASE, AMYLASE in the last 168 hours. No results for input(s): AMMONIA in the last 168 hours. Coagulation Profile: No results for input(s): INR, PROTIME in the last 168 hours. Cardiac Enzymes: No results for input(s): CKTOTAL, CKMB, CKMBINDEX, TROPONINI in the last 168 hours. BNP (last 3 results) No results for input(s):  PROBNP in the last 8760 hours. HbA1C: No results for input(s): HGBA1C in the last 72 hours. CBG: No results for input(s): GLUCAP in the last 168 hours. Lipid Profile: No results for input(s): CHOL, HDL,  LDLCALC, TRIG, CHOLHDL, LDLDIRECT in the last 72 hours. Thyroid Function Tests: No results for input(s): TSH, T4TOTAL, FREET4, T3FREE, THYROIDAB in the last 72 hours. Anemia Panel: No results for input(s): VITAMINB12, FOLATE, FERRITIN, TIBC, IRON, RETICCTPCT in the last 72 hours. Sepsis Labs: No results for input(s): PROCALCITON, LATICACIDVEN in the last 168 hours.  Recent Results (from the past 240 hour(s))  Respiratory Panel by RT PCR (Flu A&B, Covid) -     Status: None   Collection Time: 06/29/20 10:01 AM  Result Value Ref Range Status   SARS Coronavirus 2 by RT PCR NEGATIVE NEGATIVE Final    Comment: (NOTE) SARS-CoV-2 target nucleic acids are NOT DETECTED.  The SARS-CoV-2 RNA is generally detectable in upper respiratoy specimens during the acute phase of infection. The lowest concentration of SARS-CoV-2 viral copies this assay can detect is 131 copies/mL. A negative result does not preclude SARS-Cov-2 infection and should not be used as the sole basis for treatment or other patient management decisions. A negative result may occur with  improper specimen collection/handling, submission of specimen other than nasopharyngeal swab, presence of viral mutation(s) within the areas targeted by this assay, and inadequate number of viral copies (<131 copies/mL). A negative result must be combined with clinical observations, patient history, and epidemiological information. The expected result is Negative.  Fact Sheet for Patients:  PinkCheek.be  Fact Sheet for Healthcare Providers:  GravelBags.it  This test is no t yet approved or cleared by the [place] FDA and  has been authorized for detection and/or diagnosis of  SARS-CoV-2 by FDA under an Emergency Use Authorization (EUA). This EUA will remain  in effect (meaning this test can be used) for the duration of the COVID-19 declaration under Section 564(b)(1) of the Act, 21 U.S.C. section 360bbb-3(b)(1), unless the authorization is terminated or revoked sooner.     Influenza A by PCR NEGATIVE NEGATIVE Final   Influenza B by PCR NEGATIVE NEGATIVE Final    Comment: (NOTE) The Xpert Xpress SARS-CoV-2/FLU/RSV assay is intended as an aid in  the diagnosis of influenza from Nasopharyngeal swab specimens and  should not be used as a sole basis for treatment. Nasal washings and  aspirates are unacceptable for Xpert Xpress SARS-CoV-2/FLU/RSV  testing.  Fact Sheet for Patients: PinkCheek.be  Fact Sheet for Healthcare Providers: GravelBags.it  This test is not yet approved or cleared by the [place] FDA and  has been authorized for detection and/or diagnosis of SARS-CoV-2 by  FDA under an Emergency Use Authorization (EUA). This EUA will remain  in effect (meaning this test can be used) for the duration of the  Covid-19 declaration under Section 564(b)(1) of the Act, 21  U.S.C. section 360bbb-3(b)(1), unless the authorization is  terminated or revoked. Performed at [facility], [place] [address], [place], [place] [address]   MRSA PCR Screening     Status: None   Collection Time: 06/29/20  5:28 PM   Specimen: Nasal Mucosa; Nasopharyngeal  Result Value Ref Range Status   MRSA by PCR NEGATIVE NEGATIVE Final    Comment:        The GeneXpert MRSA Assay (FDA approved for NASAL specimens only), is one component of a comprehensive MRSA colonization surveillance program. It is not intended to diagnose MRSA infection nor to guide or monitor treatment for MRSA infections. Performed at [facility], [place] [address], [place], [place] [address]   Urine Culture     Status: Abnormal    Collection Time: 07/06/20  1:26 PM   Specimen: Urine, Random  Result Value Ref Range Status   Specimen Description URINE, RANDOM  Final   Special Requests   Final    NONE Performed at [facility], [address], [place], [place] [address]    Culture >=100,000 COLONIES/mL ENTEROBACTER SPECIES (A)  Final   Report Status 07/08/2020 FINAL  Final   Organism ID, Bacteria ENTEROBACTER SPECIES (A)  Final      Susceptibility   Enterobacter species - MIC*    CEFAZOLIN >=64 RESISTANT Resistant     CEFTRIAXONE <=0.25 SENSITIVE Sensitive     CIPROFLOXACIN <=0.25 SENSITIVE Sensitive     GENTAMICIN <=1 SENSITIVE Sensitive     IMIPENEM 2 SENSITIVE Sensitive     NITROFURANTOIN 32 SENSITIVE Sensitive     TRIMETH/SULFA <=20 SENSITIVE Sensitive     PIP/TAZO <=4 SENSITIVE Sensitive     * >=100,000 COLONIES/mL ENTEROBACTER SPECIES     RN Pressure Injury Documentation:     Estimated body mass index is 24.09 kg/m as calculated from the following:   Height as of this encounter: 6\' 2"  (1.88 m).   Weight as of this encounter: 85.1 kg.  Malnutrition Type:  Nutrition Problem: Inadequate oral intake Etiology: decreased appetite, acute illness   Malnutrition Characteristics:  Signs/Symptoms: per patient/family report   Nutrition Interventions:  Interventions: Ensure Enlive (each supplement provides 350kcal and 20 grams of protein)    Radiology Studies: US Abdomen Limited RUQ  Result Date: 07/08/2020 CLINICAL DATA:  Abnormal liver function tests EXAM: ULTRASOUND ABDOMEN LIMITED RIGHT UPPER QUADRANT COMPARISON:  None. FINDINGS: Gallbladder: No gallstones or wall thickening visualized. No sonographic Murphy sign noted by sonographer. Common bile duct: Diameter: 4 mm in proximal diameter Liver: No focal lesion identified. Within normal limits in parenchymal echogenicity. Portal vein is patent on color Doppler imaging with normal direction of blood flow towards the liver. Other: None.  IMPRESSION: Normal examination Electronically Signed   By: Fidela Salisbury MD   On: 07/08/2020 02:48   Scheduled Meds: .  stroke: mapping our early stages of recovery book   Does not apply Once  . aspirin  81 mg Oral Daily   Or  . aspirin  81 mg Per Tube Daily  . atorvastatin  40 mg Oral Daily  . chlorhexidine  15 mL Mouth Rinse BID  . Chlorhexidine Gluconate Cloth  6 each Topical Daily  . colchicine  0.6 mg Oral BID  . docusate  100 mg Oral BID  . feeding supplement (PROSource TF)  45 mL Per Tube Daily  . fludrocortisone  0.1 mg Oral Daily  . influenza vaccine adjuvanted  0.5 mL Intramuscular Tomorrow-1000  . mouth rinse  15 mL Mouth Rinse q12n4p  . midodrine  5 mg Oral Q8H  . pantoprazole  40 mg Oral Daily  . pneumococcal 23 valent vaccine  0.5 mL Intramuscular Tomorrow-1000  . polyethylene glycol  17 g Oral Daily   Continuous Infusions: . sodium chloride 250 mL (07/06/20 1725)  . ceFEPime (MAXIPIME) IV 2 g (07/08/20 0913)  . feeding supplement (OSMOLITE 1.5 CAL)      LOS: 9 days   Darliss Cheney, MD [facility] PAGER is on [place]  If 7PM-7AM, please contact night-coverage [email or web address]

## 2020-07-08 NOTE — Progress Notes (Signed)
  Speech Language Pathology Treatment: Cognitive-Linquistic;Dysphagia  Patient Details Name: Tyler Richards MRN: [id number] DOB: 13-Oct-1950 Today's Date: 07/08/2020 Time: 2725-3664 SLP Time Calculation (min) (ACUTE ONLY): 17 min  Assessment / Plan / Recommendation Clinical Impression  Dysphagia and cognitive treatment with wife at bedside. He states wife has been feeding him and educated for set up assist then allow pt to self feed cueing and assisting when needed. Recalled need to check for oral pocketing. Pt consumed sip water to soften cracker. Mild lingual residue cleared with liquid wash. Given choice of upgrading to Dys 2, he preferred to remain on puree today. Min verbal cues for smaller sips with straw. Delayed cough several minutes after snack. Pt able to recall half of discharge plan to CIR and discussed strategies for recall of important info. He stated reason for admission and mild prompts for physical and cognitive deficits. Will continue to benefit from ST and possible rehab admission of stable from cardiac standpoint.   HPI HPI: Patient is a 69 y/o male who presents with left sided weakness, right gaze and slurred speech. NIH:17. Head CT- dense Right MCA infarct. CTA- Right ICA and MCA occlusions. s/p right MCA thrombectomy and right ICA stent placement 06/29/20 with post procedure hemmorhage. ETT 10/4-10/5. No PMH.      SLP Plan  Continue with current plan of care       Recommendations  Diet recommendations: Dysphagia 1 (puree);Thin liquid Liquids provided via: Straw Medication Administration: Crushed with puree Supervision: Patient able to self feed;Staff to assist with self feeding;Full supervision/cueing for compensatory strategies Compensations: Minimize environmental distractions;Slow rate;Small sips/bites;Lingual sweep for clearance of pocketing Postural Changes and/or Swallow Maneuvers: Seated upright 90 degrees                Oral Care Recommendations: Oral  care BID Follow up Recommendations: Inpatient Rehab SLP Visit Diagnosis: Dysphagia, oropharyngeal phase (R13.12);Cognitive communication deficit (Q03.474) Plan: Continue with current plan of care                      Houston Siren 07/08/2020, 3:04 PM  Orbie Pyo Colvin Caroli.Ed Risk analyst [phone] Office [phone]

## 2020-07-08 NOTE — Progress Notes (Signed)
Physical Therapy Treatment Patient Details Name: Tyler Richards MRN: [id number] DOB: November 16, 1950 Today's Date: 07/08/2020    History of Present Illness Patient is a 69 y/o male who presents with left sided weakness, right gaze and slurred speech. NIH:17. Head CT- dense Right MCA infarct. CTA- Right ICA and MCA occlusions. s/p right MCA thrombectomy and right ICA stent placement 06/29/20 with post procedure hemmorhage. No PMH.    PT Comments    Pt making progress towards his goals this date with his vitals responding appropriately, noted below. Pt able to perform 5 mini-STS reps from elevated EOB with B knee block and maxA. He reported fear of falling and thus was educated and encouraged on the PTs being trained adequately. Pt appeared to become more confident with each subsequent mini STS rep as he would increase his buttocks clearance each time. Pt will benefit from intensive therapy to address his deficits and maximize his independence and safety with all functional mobility. Will continue to follow acutely.   Follow Up Recommendations  CIR;Supervision/Assistance - 24 hour     Equipment Recommendations  Other (comment) (defer to next venue)    Recommendations for Other Services Rehab consult     Precautions / Restrictions Precautions Precautions: Fall Precaution Comments: monitor HR and BP - watch for bradycardia, condom catheter Restrictions Weight Bearing Restrictions: No    Mobility  Bed Mobility Overal bed mobility: Needs Assistance Bed Mobility: Supine to Sit;Sit to Supine     Supine to sit: Mod assist;HOB elevated Sit to supine: Max assist   General bed mobility comments: Cued pt to bring LEs to EOB and R hand to L bed rail to transition supine > sit EOB, modA to manage trunk and minA to manage LEs. Sit > supine with maxA, cuing pt to descend onto L elbow and assistance to manage trunk and LEs.  Transfers Overall transfer level: Needs assistance Equipment used:  None Transfers: Sit to/from Stand Sit to Stand: Max assist;From elevated surface         General transfer comment: Performed 5 mini-STS from raised EOB without rest and B knee block and TCs and assistance under buttocks and pt cued to lean chest on therapist's shoulder, maxA. Improved buttocks clearance noted with each subsequent rep.   Ambulation/Gait                 Stairs             Wheelchair Mobility    Modified Rankin (Stroke Patients Only) Modified Rankin (Stroke Patients Only) Pre-Morbid Rankin Score: No symptoms Modified Rankin: Severe disability     Balance Overall balance assessment: Needs assistance Sitting-balance support: Feet supported;Bilateral upper extremity supported Sitting balance-Leahy Scale: Poor Sitting balance - Comments: Initially min guard for static sitting EOB with UE support, but as pt fatigued he required min-modA intermittently and cues to lean posteriorly and find midline. Postural control: Left lateral lean Standing balance support: Single extremity supported Standing balance-Leahy Scale: Zero Standing balance comment: MaxA for momentary static partial standing on elevated EOB, with hands on therapist or bed, not coming to full stand, maxA and B knee block.                            Cognition Arousal/Alertness: Awake/alert Behavior During Therapy: WFL for tasks assessed/performed Overall Cognitive Status: Impaired/Different from baseline Area of Impairment: Attention;Following commands;Safety/judgement;Problem solving  Current Attention Level: Sustained   Following Commands: Follows one step commands consistently;Follows one step commands with increased time;Follows multi-step commands inconsistently Safety/Judgement: Decreased awareness of safety;Decreased awareness of deficits   Problem Solving: Slow processing;Decreased initiation;Difficulty sequencing;Requires tactile cues;Requires  verbal cues General Comments: Requires extra time and one step cues repeated to follow directions. Pt anxious with standing due to fear of falling.      Exercises      General Comments General comments (skin integrity, edema, etc.): Vitals during session this date: at rest HR 70s-80s, but after 5x mini-STS it was in 90s. BP upon arrival with pt supine HOB elevated 133/59, upon sitting EOB 116/73, after 5x mini-STS reps 121/70, at end of session supine in bed HOB 112/56. SpO2 remained 99-100%.      Pertinent Vitals/Pain Pain Assessment: No/denies pain Pain Intervention(s): Monitored during session    Home Living                      Prior Function            PT Goals (current goals can now be found in the care plan section) Acute Rehab PT Goals Patient Stated Goal: to get better PT Goal Formulation: With patient/family Time For Goal Achievement: 07/14/20 Potential to Achieve Goals: Fair Progress towards PT goals: Progressing toward goals    Frequency    Min 4X/week      PT Plan Current plan remains appropriate    Co-evaluation     PT goals addressed during session: Mobility/safety with mobility;Balance        AM-PAC PT "6 Clicks" Mobility   Outcome Measure  Help needed turning from your back to your side while in a flat bed without using bedrails?: A Lot Help needed moving from lying on your back to sitting on the side of a flat bed without using bedrails?: A Lot Help needed moving to and from a bed to a chair (including a wheelchair)?: Total Help needed standing up from a chair using your arms (e.g., wheelchair or bedside chair)?: Total Help needed to walk in hospital room?: Total Help needed climbing 3-5 steps with a railing? : Total 6 Click Score: 8    End of Session Equipment Utilized During Treatment: Gait belt Activity Tolerance: Patient limited by fatigue;Other (comment) (pt limited by anxiety with standing) Patient left: in bed;with call  bell/phone within reach;with bed alarm set;with family/visitor present (wife present) Nurse Communication: Mobility status;Other (comment) (vitals and catheter status; MD notified of vitals) PT Visit Diagnosis: Hemiplegia and hemiparesis;Other abnormalities of gait and mobility (R26.89);Muscle weakness (generalized) (M62.81);Difficulty in walking, not elsewhere classified (R26.2);Unsteadiness on feet (R26.81) Hemiplegia - Right/Left: Left Hemiplegia - dominant/non-dominant: Non-dominant Hemiplegia - caused by: Cerebral infarction     Time: 1340-1414 PT Time Calculation (min) (ACUTE ONLY): 34 min  Charges:  $Therapeutic Exercise: 8-22 mins $Therapeutic Activity: 8-22 mins                     Moishe Spice, PT, DPT Acute Rehabilitation Services  Pager: [phone] Office: [phone]    Orvan Falconer 07/08/2020, 2:37 PM

## 2020-07-08 NOTE — Progress Notes (Signed)
Progress Note  Patient Name: Tyler Richards Date of Encounter: 07/08/2020  [facility] Cardiologist: new to [place]. Tele overnight with normal heart rate variation without significant daytime pause or evidence of AV block.  Inpatient Medications    Scheduled Meds: .  stroke: mapping our early stages of recovery book   Does not apply Once  . aspirin  81 mg Oral Daily   Or  . aspirin  81 mg Per Tube Daily  . atorvastatin  40 mg Oral Daily  . chlorhexidine  15 mL Mouth Rinse BID  . Chlorhexidine Gluconate Cloth  6 each Topical Daily  . colchicine  0.6 mg Oral BID  . docusate  100 mg Oral BID  . feeding supplement (ENSURE ENLIVE)  237 mL Oral TID BM  . fludrocortisone  0.1 mg Oral Daily  . influenza vaccine adjuvanted  0.5 mL Intramuscular Tomorrow-1000  . mouth rinse  15 mL Mouth Rinse q12n4p  . midodrine  5 mg Oral Q8H  . pantoprazole  40 mg Oral Daily  . pneumococcal 23 valent vaccine  0.5 mL Intramuscular Tomorrow-1000  . polyethylene glycol  17 g Oral Daily   Continuous Infusions: . sodium chloride 250 mL (07/06/20 1725)  . ceFEPime (MAXIPIME) IV 2 g (07/07/20 2034)   PRN Meds: acetaminophen **OR** acetaminophen (TYLENOL) oral liquid 160 mg/5 mL **OR** acetaminophen, senna-docusate   Vital Signs    Vitals:   07/07/20 1558 07/07/20 1958 07/07/20 2315 07/08/20 0355  BP: 129/71 (!) 151/58 (!) 111/55 139/71  Pulse: (!) 55 (!) 58 (!) 51 (!) 58  Resp: 18 18 18 18   Temp: 99 F (37.2 C) 100.3 F (37.9 C) 98 F (36.7 C) 98 F (36.7 C)  TempSrc: Oral Oral    SpO2: 99% 97% 100% 99%  Weight:      Height:        Intake/Output Summary (Last 24 hours) at 07/08/2020 0737 Last data filed at 07/08/2020 3662 Gross per 24 hour  Intake --  Output 900 ml  Net -900 ml   Last 3 Weights 06/29/2020  Weight (lbs) 187 lb 9.8 oz  Weight (kg) 85.1 kg      Telemetry    SB/SR, brief/transient SB 40's with normal HR variation. Personally Reviewed  ECG     No new EKGs - Personally Reviewed  Physical Exam   GEN: No acute distress.   Neck: No JVD Cardiac: RRR, no murmurs, rubs, or gallops.  Respiratory: CTA b/l. GI: Soft, nontender, non-distended  MS: No edema; No deformity. Neuro:  L hemiparesis Psych: Normal affect   Labs    High Sensitivity Troponin:  No results for input(s): TROPONINIHS in the last 720 hours.    Chemistry Recent Labs  Lab 07/06/20 0417 07/07/20 0950 07/08/20 0147  NA 137 137 138  K 3.5 3.6 3.7  CL 106 108 107  CO2 19* 22 23  GLUCOSE 95 120* 109*  BUN 15 11 10   CREATININE 0.76 0.69 0.74  CALCIUM 8.1* 8.0* 8.0*  PROT  --  5.2* 5.6*  ALBUMIN  --  1.8* 1.9*  AST  --  105* 97*  ALT  --  79* 81*  ALKPHOS  --  134* 144*  BILITOT  --  0.7 0.6  GFRNONAA >60 >60 >60  ANIONGAP 12 7 8      Hematology Recent Labs  Lab 07/06/20 0417 07/07/20 0950 07/08/20 0147  WBC 10.6* 8.0 7.8  RBC 2.99* 2.90* 3.02*  HGB  9.9* 9.4* 9.8*  HCT 29.3* 27.9* 29.1*  MCV 98.0 96.2 96.4  MCH 33.1 32.4 32.5  MCHC 33.8 33.7 33.7  RDW 12.4 12.2 12.2  PLT 406* 460* 472*    BNPNo results for input(s): BNP, PROBNP in the last 168 hours.   DDimer No results for input(s): DDIMER in the last 168 hours.   Radiology    DG CHEST PORT 1 VIEW  Result Date: 07/06/2020 CLINICAL DATA:  Fever EXAM: PORTABLE CHEST 1 VIEW COMPARISON:  June 30, 2020 FINDINGS: Endotracheal tube and nasogastric tube no longer present. There is no edema or airspace opacity. Heart size is upper normal with pulmonary vascularity normal. No adenopathy. No bone lesions. IMPRESSION: No edema or airspace opacity.  Heart upper normal in size. Electronically Signed   By: Lowella Grip III M.D.   On: 07/06/2020 10:57   US Abdomen Limited RUQ  Result Date: 07/08/2020 CLINICAL DATA:  Abnormal liver function tests EXAM: ULTRASOUND ABDOMEN LIMITED RIGHT UPPER QUADRANT COMPARISON:  None. FINDINGS: Gallbladder: No gallstones or wall thickening visualized. No  sonographic Murphy sign noted by sonographer. Common bile duct: Diameter: 4 mm in proximal diameter Liver: No focal lesion identified. Within normal limits in parenchymal echogenicity. Portal vein is patent on color Doppler imaging with normal direction of blood flow towards the liver. Other: None. IMPRESSION: Normal examination Electronically Signed   By: Fidela Salisbury MD   On: 07/08/2020 02:48    Cardiac Studies   06/30/2020: TTE IMPRESSIONS  1. Left ventricular ejection fraction, by estimation, is 50 to 55%. The  left ventricle has low normal function. The left ventricle has no regional  wall motion abnormalities. Left ventricular diastolic parameters are  consistent with Grade I diastolic  dysfunction (impaired relaxation).  2. Right ventricular systolic function is normal. The right ventricular  size is normal. There is mildly elevated pulmonary artery systolic  pressure.  3. Left atrial size was mildly dilated.  4. The mitral valve is normal in structure. No evidence of mitral valve  regurgitation. No evidence of mitral stenosis.  5. The aortic valve is normal in structure. Aortic valve regurgitation is  not visualized. No aortic stenosis is present.  6. The inferior vena cava is dilated in size with <50% respiratory  variability, suggesting right atrial pressure of 15 mmHg.   Patient Profile     69 y.o. male w/PMHx of gout only admitted 06/29/2020 with acute stroke > due to right ICA and MCA occlusion s/p IR with TICI2c and carotid stenting which reoccluded, secondary to large vessel disease source  His stay has been complicated by hypotension and bradycardic events both felt 2/2 carotid stent/manipulation >> further complicated by acute GIB requiring brilinta to be held to be held  Assessment & Plan    1. Post stroke/right ICA stenting bradycardia and pauses 2/2 hypervagatonia     HRs largely appear stable for the last few days       He has not had an prolonged pauses or  slow rates, mostly 50's (40's-60's)     Florinef 0.1mg  daily started 07/03/20 Midodrine 5mg  TID started 07/01/20 >> 10mg  Q8 on 07/02/20  Plan discussed with wife and patient at the bedside this AM.  For questions or updates, please contact [place] Please consult [email or web address] for contact info under     Signed, Hilton Cork. Quentin Ore, MD, [facility] Cardiac Electrophysiology

## 2020-07-09 ENCOUNTER — Encounter (HOSPITAL_COMMUNITY): Payer: Self-pay | Admitting: Physical Medicine & Rehabilitation

## 2020-07-09 ENCOUNTER — Other Ambulatory Visit: Payer: Self-pay

## 2020-07-09 ENCOUNTER — Inpatient Hospital Stay (HOSPITAL_COMMUNITY)
Admission: RE | Admit: 2020-07-09 | Discharge: 2020-08-12 | DRG: 057 | Disposition: A | Discharge status: Home Health | Payer: Medicare Other | Source: Intra-hospital | Attending: Physical Medicine & Rehabilitation | Admitting: Physical Medicine & Rehabilitation

## 2020-07-09 DIAGNOSIS — I63311 Cerebral infarction due to thrombosis of right middle cerebral artery: Secondary | ICD-10-CM

## 2020-07-09 DIAGNOSIS — I951 Orthostatic hypotension: Secondary | ICD-10-CM | POA: Diagnosis present

## 2020-07-09 DIAGNOSIS — R1310 Dysphagia, unspecified: Secondary | ICD-10-CM | POA: Diagnosis present

## 2020-07-09 DIAGNOSIS — I69354 Hemiplegia and hemiparesis following cerebral infarction affecting left non-dominant side: Principal | ICD-10-CM

## 2020-07-09 DIAGNOSIS — G8114 Spastic hemiplegia affecting left nondominant side: Secondary | ICD-10-CM | POA: Diagnosis not present

## 2020-07-09 DIAGNOSIS — I2510 Atherosclerotic heart disease of native coronary artery without angina pectoris: Secondary | ICD-10-CM | POA: Diagnosis present

## 2020-07-09 DIAGNOSIS — K5900 Constipation, unspecified: Secondary | ICD-10-CM | POA: Diagnosis present

## 2020-07-09 DIAGNOSIS — L8990 Pressure ulcer of unspecified site, unspecified stage: Secondary | ICD-10-CM | POA: Insufficient documentation

## 2020-07-09 DIAGNOSIS — L89151 Pressure ulcer of sacral region, stage 1: Secondary | ICD-10-CM | POA: Diagnosis not present

## 2020-07-09 DIAGNOSIS — R001 Bradycardia, unspecified: Secondary | ICD-10-CM | POA: Diagnosis not present

## 2020-07-09 DIAGNOSIS — G8110 Spastic hemiplegia affecting unspecified side: Secondary | ICD-10-CM

## 2020-07-09 DIAGNOSIS — B9689 Other specified bacterial agents as the cause of diseases classified elsewhere: Secondary | ICD-10-CM | POA: Diagnosis present

## 2020-07-09 DIAGNOSIS — R197 Diarrhea, unspecified: Secondary | ICD-10-CM

## 2020-07-09 DIAGNOSIS — R109 Unspecified abdominal pain: Secondary | ICD-10-CM

## 2020-07-09 DIAGNOSIS — N390 Urinary tract infection, site not specified: Secondary | ICD-10-CM | POA: Diagnosis present

## 2020-07-09 DIAGNOSIS — Z7982 Long term (current) use of aspirin: Secondary | ICD-10-CM

## 2020-07-09 DIAGNOSIS — M62838 Other muscle spasm: Secondary | ICD-10-CM | POA: Diagnosis present

## 2020-07-09 DIAGNOSIS — K649 Unspecified hemorrhoids: Secondary | ICD-10-CM | POA: Diagnosis present

## 2020-07-09 DIAGNOSIS — M1A9XX1 Chronic gout, unspecified, with tophus (tophi): Secondary | ICD-10-CM | POA: Diagnosis present

## 2020-07-09 DIAGNOSIS — I69391 Dysphagia following cerebral infarction: Secondary | ICD-10-CM | POA: Diagnosis not present

## 2020-07-09 DIAGNOSIS — I495 Sick sinus syndrome: Secondary | ICD-10-CM | POA: Diagnosis present

## 2020-07-09 DIAGNOSIS — Z79899 Other long term (current) drug therapy: Secondary | ICD-10-CM

## 2020-07-09 DIAGNOSIS — E785 Hyperlipidemia, unspecified: Secondary | ICD-10-CM | POA: Diagnosis present

## 2020-07-09 DIAGNOSIS — I10 Essential (primary) hypertension: Secondary | ICD-10-CM | POA: Diagnosis present

## 2020-07-09 DIAGNOSIS — I63511 Cerebral infarction due to unspecified occlusion or stenosis of right middle cerebral artery: Secondary | ICD-10-CM | POA: Diagnosis not present

## 2020-07-09 DIAGNOSIS — K625 Hemorrhage of anus and rectum: Secondary | ICD-10-CM

## 2020-07-09 DIAGNOSIS — D649 Anemia, unspecified: Secondary | ICD-10-CM | POA: Diagnosis present

## 2020-07-09 DIAGNOSIS — I69328 Other speech and language deficits following cerebral infarction: Secondary | ICD-10-CM | POA: Diagnosis not present

## 2020-07-09 DIAGNOSIS — I950 Idiopathic hypotension: Secondary | ICD-10-CM | POA: Diagnosis not present

## 2020-07-09 DIAGNOSIS — G9340 Encephalopathy, unspecified: Secondary | ICD-10-CM

## 2020-07-09 DIAGNOSIS — I6930 Unspecified sequelae of cerebral infarction: Secondary | ICD-10-CM | POA: Diagnosis present

## 2020-07-09 HISTORY — DX: Atherosclerotic heart disease of native coronary artery without angina pectoris: I25.10

## 2020-07-09 HISTORY — DX: Essential (primary) hypertension: I10

## 2020-07-09 LAB — CBC WITH DIFFERENTIAL/PLATELET
Abs Immature Granulocytes: 0.02 10*3/uL (ref 0.00–0.07)
Basophils Absolute: 0 10*3/uL (ref 0.0–0.1)
Basophils Relative: 1 %
Eosinophils Absolute: 0.1 10*3/uL (ref 0.0–0.5)
Eosinophils Relative: 1 %
HCT: 29.8 % — ABNORMAL LOW (ref 39.0–52.0)
Hemoglobin: 10.2 g/dL — ABNORMAL LOW (ref 13.0–17.0)
Immature Granulocytes: 0 %
Lymphocytes Relative: 16 %
Lymphs Abs: 1.1 10*3/uL (ref 0.7–4.0)
MCH: 32.8 pg (ref 26.0–34.0)
MCHC: 34.2 g/dL (ref 30.0–36.0)
MCV: 95.8 fL (ref 80.0–100.0)
Monocytes Absolute: 0.9 10*3/uL (ref 0.1–1.0)
Monocytes Relative: 12 %
Neutro Abs: 5.1 10*3/uL (ref 1.7–7.7)
Neutrophils Relative %: 70 %
Platelets: 586 10*3/uL — ABNORMAL HIGH (ref 150–400)
RBC: 3.11 MIL/uL — ABNORMAL LOW (ref 4.22–5.81)
RDW: 12.2 % (ref 11.5–15.5)
WBC: 7.3 10*3/uL (ref 4.0–10.5)
nRBC: 0 % (ref 0.0–0.2)

## 2020-07-09 LAB — MAGNESIUM
Magnesium: 1.9 mg/dL (ref 1.7–2.4)
Magnesium: 2 mg/dL (ref 1.7–2.4)

## 2020-07-09 LAB — BASIC METABOLIC PANEL
Anion gap: 7 (ref 5–15)
BUN: 9 mg/dL (ref 8–23)
CO2: 24 mmol/L (ref 22–32)
Calcium: 8 mg/dL — ABNORMAL LOW (ref 8.9–10.3)
Chloride: 107 mmol/L (ref 98–111)
Creatinine, Ser: 0.64 mg/dL (ref 0.61–1.24)
GFR, Estimated: >60 mL/min (ref >60)
Glucose, Bld: 104 mg/dL — ABNORMAL HIGH (ref 70–99)
Potassium: 3.3 mmol/L — ABNORMAL LOW (ref 3.5–5.1)
Sodium: 138 mmol/L (ref 135–145)

## 2020-07-09 LAB — PHOSPHORUS
Phosphorus: 2.7 mg/dL (ref 2.5–4.6)
Phosphorus: 2.3 mg/dL — ABNORMAL LOW (ref 2.5–4.6)

## 2020-07-09 MED ORDER — SENNOSIDES-DOCUSATE SODIUM 8.6-50 MG PO TABS
1.0000 | ORAL_TABLET | Freq: Two times a day (BID) | ORAL | Status: DC
  Administered 2020-07-10 – 2020-07-11 (×4): 1 via ORAL
  Filled 2020-07-09 (×8): qty 1

## 2020-07-09 MED ORDER — SULFAMETHOXAZOLE-TRIMETHOPRIM 800-160 MG PO TABS
1.0000 | ORAL_TABLET | Freq: Two times a day (BID) | ORAL | Status: AC
  Administered 2020-07-09 – 2020-07-11 (×5): 1 via ORAL
  Filled 2020-07-09 (×5): qty 1

## 2020-07-09 MED ORDER — POLYETHYLENE GLYCOL 3350 17 G PO PACK
17.0000 g | PACK | Freq: Every day | ORAL | Status: DC
  Filled 2020-07-09 (×4): qty 1

## 2020-07-09 MED ORDER — BISACODYL 10 MG RE SUPP
10.0000 mg | Freq: Every day | RECTAL | Status: DC | PRN
  Administered 2020-07-09 – 2020-08-10 (×6): 10 mg via RECTAL
  Filled 2020-07-09 (×6): qty 1

## 2020-07-09 MED ORDER — ACETAMINOPHEN 325 MG PO TABS
650.0000 mg | ORAL_TABLET | ORAL | Status: DC | PRN
  Administered 2020-07-10 – 2020-08-12 (×94): 650 mg via ORAL
  Filled 2020-07-09 (×96): qty 2

## 2020-07-09 MED ORDER — ACETAMINOPHEN 160 MG/5ML PO SOLN
650.0000 mg | ORAL | Status: DC | PRN
  Administered 2020-07-16 – 2020-07-22 (×2): 650 mg
  Filled 2020-07-09: qty 20.3

## 2020-07-09 MED ORDER — ENSURE ENLIVE PO LIQD
237.0000 mL | Freq: Three times a day (TID) | ORAL | Status: DC
  Administered 2020-07-11 – 2020-07-21 (×7): 237 mL via ORAL
  Filled 2020-07-09: qty 237

## 2020-07-09 MED ORDER — ACETAMINOPHEN 650 MG RE SUPP: 650.0000 mg | RECTAL | Status: DC | PRN

## 2020-07-09 MED ORDER — ATORVASTATIN CALCIUM 40 MG PO TABS
40.0000 mg | ORAL_TABLET | Freq: Every day | ORAL | Status: DC
  Administered 2020-07-10 – 2020-08-12 (×34): 40 mg via ORAL
  Filled 2020-07-09 (×34): qty 1

## 2020-07-09 MED ORDER — MIDODRINE HCL 5 MG PO TABS
5.0000 mg | ORAL_TABLET | Freq: Three times a day (TID) | ORAL | Status: DC
  Administered 2020-07-10 – 2020-07-29 (×58): 5 mg via ORAL
  Filled 2020-07-09 (×58): qty 1

## 2020-07-09 MED ORDER — ATORVASTATIN CALCIUM 40 MG PO TABS
40.0000 mg | ORAL_TABLET | Freq: Every day | ORAL | 0 refills | Status: DC
Start: 2020-07-09 — End: 2020-08-11

## 2020-07-09 MED ORDER — MIDODRINE HCL 5 MG PO TABS: 5.0000 mg | ORAL_TABLET | Freq: Three times a day (TID) | ORAL | 0 refills | Status: DC

## 2020-07-09 MED ORDER — DOCUSATE SODIUM 50 MG/5ML PO LIQD: 100.0000 mg | Freq: Two times a day (BID) | ORAL | Status: DC

## 2020-07-09 MED ORDER — SULFAMETHOXAZOLE-TRIMETHOPRIM 800-160 MG PO TABS
1.0000 | ORAL_TABLET | Freq: Two times a day (BID) | ORAL | 0 refills | Status: DC
Start: 2020-07-09 — End: 2020-08-12

## 2020-07-09 MED ORDER — POTASSIUM CHLORIDE CRYS ER 20 MEQ PO TBCR
40.0000 meq | EXTENDED_RELEASE_TABLET | Freq: Once | ORAL | Status: AC
  Administered 2020-07-09: 40 meq via ORAL
  Filled 2020-07-09: qty 2

## 2020-07-09 MED ORDER — ASPIRIN 81 MG PO CHEW
81.0000 mg | CHEWABLE_TABLET | Freq: Every day | ORAL | Status: DC
  Administered 2020-08-01: 81 mg
  Filled 2020-07-09 (×13): qty 1

## 2020-07-09 MED ORDER — ASPIRIN 81 MG PO CHEW
81.0000 mg | CHEWABLE_TABLET | Freq: Every day | ORAL | Status: DC
  Administered 2020-07-10 – 2020-08-12 (×33): 81 mg via ORAL
  Filled 2020-07-09 (×34): qty 1

## 2020-07-09 MED ORDER — PANTOPRAZOLE SODIUM 40 MG PO TBEC
40.0000 mg | DELAYED_RELEASE_TABLET | Freq: Every day | ORAL | 0 refills | Status: DC
Start: 2020-07-09 — End: 2020-08-11

## 2020-07-09 MED ORDER — SENNOSIDES-DOCUSATE SODIUM 8.6-50 MG PO TABS: 1.0000 | ORAL_TABLET | Freq: Every evening | ORAL | Status: DC | PRN

## 2020-07-09 MED ORDER — PANTOPRAZOLE SODIUM 40 MG PO TBEC
40.0000 mg | DELAYED_RELEASE_TABLET | Freq: Every day | ORAL | Status: DC
  Administered 2020-07-10 – 2020-08-12 (×34): 40 mg via ORAL
  Filled 2020-07-09 (×34): qty 1

## 2020-07-09 MED ORDER — FLUDROCORTISONE ACETATE 0.1 MG PO TABS: 0.1000 mg | ORAL_TABLET | Freq: Every day | ORAL | 0 refills | Status: DC

## 2020-07-09 MED ORDER — COLCHICINE 0.6 MG PO TABS
0.6000 mg | ORAL_TABLET | Freq: Two times a day (BID) | ORAL | Status: DC
  Administered 2020-07-09 – 2020-07-16 (×14): 0.6 mg via ORAL
  Filled 2020-07-09 (×14): qty 1

## 2020-07-09 MED ORDER — FLUDROCORTISONE ACETATE 0.1 MG PO TABS
0.1000 mg | ORAL_TABLET | Freq: Every day | ORAL | Status: DC
  Administered 2020-07-10 – 2020-07-13 (×4): 0.1 mg via ORAL
  Filled 2020-07-09 (×4): qty 1

## 2020-07-09 NOTE — Progress Notes (Signed)
STROKE TEAM PROGRESS NOTE   SUBJECTIVE (INTERVAL HISTORY) Patient is sitting in bedside today working with physical therapist.  His heart rate and blood pressure are stable.  He is likely going to be transferred to inpatient rehab today.  He has no new complaints.  Neurological exam is stable.  Vital signs are stable.  Hematocrit is stable at 29.8.  Magnesium and phosphorus are normal OBJECTIVE Temp:  [97.7 F (36.5 C)-98.5 F (36.9 C)] 97.7 F (36.5 C) (10/14 0700) Pulse Rate:  [48-70] 48 (10/14 0700) Cardiac Rhythm: Other (Comment) (10/14 0902) Resp:  [15-20] 20 (10/14 0700) BP: (105-126)/(57-69) 124/69 (10/14 0700) SpO2:  [97 %-98 %] 98 % (10/14 0700)  Recent Labs  Lab 07/05/20 0314 07/05/20 0314 07/06/20 0417 07/06/20 0417 07/07/20 0950 07/08/20 0147 07/08/20 2231 07/09/20 0626  NA 137  --  137  --  137 138  --  138  K 3.3*  --  3.5  --  3.6 3.7  --  3.3*  CL 106  --  106  --  108 107  --  107  CO2 21*  --  19*  --  22 23  --  24  GLUCOSE 93  --  95  --  120* 109*  --  104*  BUN 14  --  15  --  11 10  --  9  CREATININE 0.89  --  0.76  --  0.69 0.74  --  0.64  CALCIUM 8.0*   < > 8.1*   < > 8.0* 8.0*  --  8.0*  MG  --   --   --   --  2.1 2.0 2.0 1.9  PHOS  --   --   --   --  2.5 2.9 2.2* 2.7   < > = values in this interval not displayed.   Recent Labs  Lab 07/07/20 0950 07/08/20 0147  AST 105* 97*  ALT 79* 81*  ALKPHOS 134* 144*  BILITOT 0.7 0.6  PROT 5.2* 5.6*  ALBUMIN 1.8* 1.9*   Recent Labs  Lab 07/05/20 0314 07/06/20 0417 07/07/20 0950 07/08/20 0147 07/09/20 0626  WBC 7.8 10.6* 8.0 7.8 7.3  NEUTROABS 6.0  --  6.3 5.6 5.1  HGB 9.8* 9.9* 9.4* 9.8* 10.2*  HCT 29.1* 29.3* 27.9* 29.1* 29.8*  MCV 97.0 98.0 96.2 96.4 95.8  PLT 365 406* 460* 472* 586*   Imaging past 24h No results found.  PHYSICAL EXAM      Temp:  [97.7 F (36.5 C)-98.5 F (36.9 C)] 97.7 F (36.5 C) (10/14 0700) Pulse Rate:  [48-70] 48 (10/14 0700) Resp:  [15-20] 20 (10/14  0700) BP: (105-126)/(57-69) 124/69 (10/14 0700) SpO2:  [97 %-98 %] 98 % (10/14 0700)  General -obese elderly Caucasian male not in acute distress.  Ophthalmologic - fundi not visualized due to noncooperation.  Cardiovascular - Regular rate and rhythm.  Neuro - awake alert, eyes open, oriented x 3, no aphasia, able to name and repeat but mild dysarthria, able to follow simple commands. Eyes in right gaze perference position but able to cross midline and left gaze incomplete, left hemianopia and visual neglect, however, able to track bilaterally, PERRL. Left facial droop and decreased eye closure. RUE and RLE at least 4/5. LUE proximal 2-/5 and distal 0/5, LLE 2/5 proximal and 0/5 distally. DTR 1+ and no babinski. Sensation symmetrical per pt, however, left sensory neglect. Right FTN intact on the right and gait not tested.   ASSESSMENT/PLAN [name]  is a 69 y.o. male with history of gout admitted for left-sided weakness, right gaze, left facial droop with fall. No tPA given due to outside window.    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 Evolution R frontoinsular infarcts. R ICA occluded.  Carotid Doppler confirmed right ICA re-occluded  2D Echo EF 50 to 55%  LE venous Doppler no DVT  LDL 93  HgbA1c 5.6  Off Heparin sq at request of GI given BRBPR, SCDsfor VTE prophylaxis    No antithrombotic prior to admission, now on aspirin 81 mg daily  (off Brilinta  given GIB and ICA has re-occluded)  Therapy recommendations:  CIR  Disposition:  CIR  Carotid occlusion  CTA head and neck right ICA and MCA occlusion  S/p carotid stenting due to severe athero but repeat carotid Doppler confirmed right ICA re-occluded  On ASA   Off brilinta due to LGIB and stent re-occluded  Hypotension and severe sinus bradycardia Orthostatic hypotension  Likely related to carotid stenting  cards does nor support permanent pacing at this time d/t likely transient due to carotid stent and potential for bacteremia  Had orthostatic episode 10/7 with PT - BP down to 80s on sitting  On Levophed-> turn off -> resumed after orthostatic episode->off  Cardiology on board  On Midodrine 5 tid 10/6->10q8 10/7->decreasing dose to 5 tid 10/11 d/t risk bradycardia - monitor - plan to wean  On florinef 0.1mg   Long term BP goal 130-150 given occluded right ICA  LGIB  BRBPR 07/02/2020  Hgb 11.7->10.1->10.3->9.8->9.9 - drifting down  Off Brilinta, sq heparin (aspirin continued)  GI on board  Currently conservative management   GI standing by for colonoscopy when bradycardia improves and cleared by anesthesia - if urgent need, recommend CTAngio abd/pelvis to help localize - contact GI when pt stable for colon  montior  Fever  TM 102.4  WBC 7.8 ->10.6   CXR NAD  UA + bacteria, +nitrates, >50 WBC  UCx pending   Empiric Rocephin 10/11>> (5 days)    Hyperlipidemia  Home meds: None  LDL 93, goal < 70  Add lipitor 40  Continue statin at discharge  Dysphagia  Passed swallow  On dys 1 and thin liquid  Speech on board  Other Stroke Risk Factors    Other Active Problems  Gout  Hypokalemia 3.6->3.4->3.5 - supplement - resolved  L knee & R elbow pain - warm and red and swollen - suspect gout flare (has had in past) - uric acid 4.2 - on colchicine and resumed home allopurinol  Hospital day # 10 Transfer to inpatient rehab if bed  available for ongoing therapies.  Continue aspirin for stroke prevention.  Outpatient follow-up with GI for colonoscopy for source of bleeding.  Follow-up with stroke with Dr. Einar Grad.  Greater than 50% time during this 25-minute visit was spent on counseling and coordination of care about his stroke and GI bleed and answering questions.  Stroke team will sign off.  Can call for questions.  Antony Contras, MD To contact Stroke Continuity provider, please refer to [email or web address] After hours, contact General Neurology

## 2020-07-09 NOTE — Progress Notes (Signed)
Inpatient Rehabilitation  Patient information reviewed and entered into eRehab system by Romulus Hanrahan M. Kameryn Tisdel, M.A., CCC/SLP, PPS Coordinator.  Information including medical coding, functional ability and quality indicators will be reviewed and updated through discharge.    

## 2020-07-09 NOTE — H&P (Signed)
Physical Medicine and Rehabilitation Admission H&P    No chief complaint on file. : HPI: Tyler Richards is a 69 year old right-handed male with unremarkable past medical history no prescription medications.  Per chart review lives with spouse independent and active prior to admission.  1 level home 2 steps to entry.  Presented 06/29/2020 with acute onset of left-sided weakness and slurred speech.  Cranial CT scan showed hyperdense distal right ICA and proximal MCA.  Blunted appearance of the posterior right putamen.  Chronic right high frontal cortex infarct.  Patient did not receive TPA.  CT angiogram of head and neck emergent large vessel occlusion with no flow seen in the right internal carotid artery or proximal MCA.  Patient underwent right MCA thrombectomy and right ICA stent placement 06/29/2020 per interventional radiology. Most recent MRI and imaging revealed acute infarct right basal ganglia with nonprogressive hemorrhage when correlated with a prior CT of 06/29/2020.  Lower extremity Dopplers no signs of DVT.  Carotid Dopplers no ICA stenosis.  Echocardiogram with ejection fraction of 50 to 03% grade 1 diastolic dysfunction.  Admission chemistries unremarkable aside from glucose 104 urine drug screen negative.  Patient did receive cardiology consult for bradycardia consistent with hyper vagotonia and currently maintained on ProAmatine as well as Florinef.  EKG normal sinus rhythm 70s to 80s occasional sinus bradycardia.  Patient was initially maintained on aspirin as well as Brilinta.  Patient was extubated 06/30/2020.  Urine culture greater 100,000 Enterobacter placed on Maxipime changed to Bactrim.  Gastroenterology services consulted due to some bright red blood with bowel movement currently holding off on any endoscopic evaluation monitor hemoglobin hematocrit with latest hemoglobin 9.8 and no further episodes reported.  His Brilinta has been placed on hold after rectal outlet bleeding and  continues only on low-dose aspirin at the recommendations of neurology services.  Currently on a dysphagia #1 thin liquid diet.  Therapy evaluations completed and patient was admitted for a comprehensive rehab program.  Review of Systems  Constitutional: Negative for chills and fever.  Eyes: Negative for blurred vision and double vision.  Respiratory: Negative for cough and shortness of breath.   Cardiovascular: Negative for chest pain, palpitations and leg swelling.  Gastrointestinal: Positive for constipation. Negative for heartburn, nausea and vomiting.  Genitourinary: Negative for dysuria, flank pain and hematuria.  Musculoskeletal: Positive for myalgias.  Skin: Negative for rash.  Neurological: Positive for speech change and weakness.  All other systems reviewed and are negative.  Past Medical History:  Diagnosis Date  . Coronary artery disease   . Hypertension    Past Surgical History:  Procedure Laterality Date  . IR ANGIO INTRA EXTRACRAN SEL COM CAROTID INNOMINATE UNI L MOD SED  06/29/2020  . IR CT HEAD LTD  06/29/2020  . IR CT HEAD LTD  06/29/2020  . IR INTRAVSC STENT CERV CAROTID W/O EMB-PROT MOD SED INC ANGIO  06/29/2020  . IR PERCUTANEOUS ART THROMBECTOMY/INFUSION INTRACRANIAL INC DIAG ANGIO  06/29/2020  . RADIOLOGY WITH ANESTHESIA N/A 06/29/2020   Procedure: IR WITH ANESTHESIA;  Surgeon: Radiologist, Medication, MD;  Location: [facility];  Service: Radiology;  Laterality: N/A;   History reviewed. No pertinent family history. Social History:  reports that he has never smoked. He has never used smokeless tobacco. He reports previous alcohol use. He reports that he does not use drugs. Allergies: No Known Allergies Medications Prior to Admission  Medication Sig Dispense Refill  . acetaminophen (TYLENOL) 325 MG tablet Take 650 mg by mouth every  6 (six) hours as needed for mild pain or headache.    Marland Kitchen aspirin EC 81 MG tablet Take 81 mg by mouth daily as needed for mild pain. Swallow  whole.    Marland Kitchen atorvastatin (LIPITOR) 40 MG tablet Take 1 tablet (40 mg total) by mouth daily. 30 tablet 0  . fludrocortisone (FLORINEF) 0.1 MG tablet Take 1 tablet (0.1 mg total) by mouth daily. 30 tablet 0  . midodrine (PROAMATINE) 5 MG tablet Take 1 tablet (5 mg total) by mouth every 8 (eight) hours. 90 tablet 0  . pantoprazole (PROTONIX) 40 MG tablet Take 1 tablet (40 mg total) by mouth daily. 30 tablet 0  . sulfamethoxazole-trimethoprim (BACTRIM DS) 800-160 MG tablet Take 1 tablet by mouth every 12 (twelve) hours for 3 days. 6 tablet 0    Drug Regimen Review   Home: Home Living Family/patient expects to be discharged to:: Private residence (modular home) Living Arrangements: Spouse/significant other Available Help at Discharge: Family, Available 24 hours/day Type of Home: Mobile home Home Access: Stairs to enter [employer] of Steps: 2 Entrance Stairs-Rails: Right Home Layout: One level Bathroom Shower/Tub: Chiropodist: Standard Bathroom Accessibility: Yes Home Equipment: Grab bars - tub/shower, Radio producer - single point, Environmental consultant - 2 wheels, Wheelchair - manual  Lives With: Spouse   Functional History: Prior Function Level of Independence: Independent Comments: Independent for ADLs, walking. Drives.  Functional Status:  Mobility: Bed Mobility Overal bed mobility: Needs Assistance Bed Mobility: Supine to Sit, Sit to Supine Rolling: Mod assist, +2 for safety/equipment Sidelying to sit: +2 for physical assistance, +2 for safety/equipment, Mod assist Supine to sit: Mod assist, HOB elevated Sit to supine: Max assist General bed mobility comments: Cued pt to bring LEs to EOB and R hand to L bed rail to transition supine > sit EOB, modA to manage trunk and minA to manage LEs. Sit > supine with maxA, cuing pt to descend onto L elbow and assistance to manage trunk and LEs. Transfers Overall transfer level: Needs assistance Equipment used: None Transfer  via Lift Equipment: Stedy Transfers: Sit to/from Stand Sit to Stand: Max assist, From elevated surface Stand pivot transfers: Total assist General transfer comment: Performed 5 mini-STS from raised EOB without rest and B knee block and TCs and assistance under buttocks and pt cued to lean chest on therapist's shoulder, maxA. Improved buttocks clearance noted with each subsequent rep.  Ambulation/Gait General Gait Details: unable  ADL: ADL Overall ADL's : Needs assistance/impaired Eating/Feeding: NPO Grooming: Brushing hair, Minimal assistance, Sitting Grooming Details (indicate cue type and reason): requires assist for sitting balance without UE support  Upper Body Bathing: Moderate assistance, Sitting Lower Body Bathing: Maximal assistance, +2 for safety/equipment, +2 for physical assistance, Sitting/lateral leans, Sit to/from stand Upper Body Dressing : Moderate assistance, Sitting Lower Body Dressing: Total assistance, +2 for physical assistance, +2 for safety/equipment, Bed level Toileting- Clothing Manipulation and Hygiene: Total assistance, +2 for physical assistance, +2 for safety/equipment, Bed level Functional mobility during ADLs: Maximal assistance, +2 for physical assistance, +2 for safety/equipment General ADL Comments: pt seen for splint check approx 4hrs after splint re-donned post therapy session. pt with some slight indentation noted at lateral aspect of thumb which improved with time. pt without redness to medial aspect of thumb today. he denies pain/discomfort   Cognition: Cognition Overall Cognitive Status: Impaired/Different from baseline Arousal/Alertness: Awake/alert Orientation Level: Oriented X4 Attention: Sustained Sustained Attention: Appears intact (during brief intervals of time) Memory: Impaired Memory Impairment: Retrieval deficit, Decreased  short term memory Decreased Short Term Memory: Verbal basic (recalled 2/4 words independently; 4/4 with  cues) Awareness: Impaired Awareness Impairment: Emergent impairment Problem Solving: Impaired Problem Solving Impairment: Functional basic, Functional complex Executive Function: Self Monitoring, Self Correcting Self Monitoring: Impaired Self Monitoring Impairment: Functional basic Self Correcting: Impaired Self Correcting Impairment: Functional basic Safety/Judgment: Impaired Comments: L inattention Cognition Arousal/Alertness: Awake/alert Behavior During Therapy: WFL for tasks assessed/performed Overall Cognitive Status: Impaired/Different from baseline Area of Impairment: Attention, Following commands, Safety/judgement, Problem solving Orientation Level: Time, Disoriented to Current Attention Level: Sustained Following Commands: Follows one step commands consistently, Follows one step commands with increased time, Follows multi-step commands inconsistently Safety/Judgement: Decreased awareness of safety, Decreased awareness of deficits Awareness: Emergent Problem Solving: Slow processing, Decreased initiation, Difficulty sequencing, Requires tactile cues, Requires verbal cues General Comments: Requires extra time and one step cues repeated to follow directions. Pt anxious with standing due to fear of falling.   Physical Exam: Blood pressure (!) 101/57, pulse (!) 48, temperature 98.3 F (36.8 C), temperature source Oral, resp. rate 20, height 6' (1.829 m), weight 86.2 kg, SpO2 98 %.  General: Alert, No apparent distress HEENT: Head is normocephalic, atraumatic, PERRLA, EOMI, sclera anicteric, oral mucosa pink and moist, dentition intact, ext ear canals clear,  Neck: Supple without JVD or lymphadenopathy Heart: Bradycardia. No murmurs rubs or gallops Chest: CTA bilaterally without wheezes, rales, or rhonchi; no distress Abdomen: Soft, non-tender, non-distended, bowel sounds positive. Extremities: No clubbing, cyanosis, or edema. Pulses are 2+ Skin: Clean and intact without signs  of breakdown Neuro: Patient is a bit lethargic but arousable.  She was able to state his birthday and reason for being in the hospital.  He does demonstrate some left-sided inattention.  Follows simple commands. Left facial droop.  RUE: 4/5 RLE: 4/5 LUE: 2/5 SA, EE, EF, 0/5 WE, hand grip LLE: 0/5 HF, KE, 1/5 DF, PF Psych: [name] affect is appropriate. Pt is cooperative   Results for orders placed or performed during the hospital encounter of 06/29/20 (from the past 48 hour(s))  Hepatitis panel, acute     Status: None   Collection Time: 07/08/20  1:47 AM  Result Value Ref Range   Hepatitis B Surface Ag NON REACTIVE NON REACTIVE   HCV Ab NON REACTIVE NON REACTIVE    Comment: (NOTE) Nonreactive HCV antibody screen is consistent with no HCV infections,  unless recent infection is suspected or other evidence exists to indicate HCV infection.     Hep A IgM NON REACTIVE NON REACTIVE   Hep B C IgM NON REACTIVE NON REACTIVE    Comment: Performed at [facility], [place] [address], [place], [place] [address]  CBC with Differential/Platelet     Status: Abnormal   Collection Time: 07/08/20  1:47 AM  Result Value Ref Range   WBC 7.8 4.0 - 10.5 K/uL   RBC 3.02 (L) 4.22 - 5.81 MIL/uL   Hemoglobin 9.8 (L) 13.0 - 17.0 g/dL   HCT 29.1 (L) 39 - 52 %   MCV 96.4 80.0 - 100.0 fL   MCH 32.5 26.0 - 34.0 pg   MCHC 33.7 30.0 - 36.0 g/dL   RDW 12.2 11.5 - 15.5 %   Platelets 472 (H) 150 - 400 K/uL   nRBC 0.0 0.0 - 0.2 %   Neutrophils Relative % 71 %   Neutro Abs 5.6 1.7 - 7.7 K/uL   Lymphocytes Relative 16 %   Lymphs Abs 1.2 0.7 - 4.0 K/uL   Monocytes  Relative 11 %   Monocytes Absolute 0.8 0.1 - 1.0 K/uL   Eosinophils Relative 1 %   Eosinophils Absolute 0.1 0.0 - 0.5 K/uL   Basophils Relative 1 %   Basophils Absolute 0.0 0.0 - 0.1 K/uL   Immature Granulocytes 0 %   Abs Immature Granulocytes 0.03 0.00 - 0.07 K/uL    Comment: Performed at [facility] [address], [place], [place]  [address]  Comprehensive metabolic panel     Status: Abnormal   Collection Time: 07/08/20  1:47 AM  Result Value Ref Range   Sodium 138 135 - 145 mmol/L   Potassium 3.7 3.5 - 5.1 mmol/L   Chloride 107 98 - 111 mmol/L   CO2 23 22 - 32 mmol/L   Glucose, Bld 109 (H) 70 - 99 mg/dL    Comment: Glucose reference range applies only to samples taken after fasting for at least 8 hours.   BUN 10 8 - 23 mg/dL   Creatinine, Ser 0.74 0.61 - 1.24 mg/dL   Calcium 8.0 (L) 8.9 - 10.3 mg/dL   Total Protein 5.6 (L) 6.5 - 8.1 g/dL   Albumin 1.9 (L) 3.5 - 5.0 g/dL   AST 97 (H) 15 - 41 U/L   ALT 81 (H) 0 - 44 U/L   Alkaline Phosphatase 144 (H) 38 - 126 U/L   Total Bilirubin 0.6 0.3 - 1.2 mg/dL   GFR, Estimated >60 >60 mL/min   Anion gap 8 5 - 15    Comment: Performed at [facility] [address] [place], [place] [address]  Phosphorus     Status: None   Collection Time: 07/08/20  1:47 AM  Result Value Ref Range   Phosphorus 2.9 2.5 - 4.6 mg/dL    Comment: Performed at [facility] [address], [place], [place] [address]  Magnesium     Status: None   Collection Time: 07/08/20  1:47 AM  Result Value Ref Range   Magnesium 2.0 1.7 - 2.4 mg/dL    Comment: Performed at [facility] [address], [place], [place] [address]  Magnesium     Status: None   Collection Time: 07/08/20 10:31 PM  Result Value Ref Range   Magnesium 2.0 1.7 - 2.4 mg/dL    Comment: Performed at [facility] [address] [place], [place] [address]  Phosphorus     Status: Abnormal   Collection Time: 07/08/20 10:31 PM  Result Value Ref Range   Phosphorus 2.2 (L) 2.5 - 4.6 mg/dL    Comment: Performed at [facility] [address], [place], [place] [address]  Magnesium     Status: None   Collection Time: 07/09/20  6:26 AM  Result Value Ref Range   Magnesium 1.9 1.7 - 2.4 mg/dL    Comment: Performed at [facility] [address] [place], [place] [address]  Phosphorus     Status:  None   Collection Time: 07/09/20  6:26 AM  Result Value Ref Range   Phosphorus 2.7 2.5 - 4.6 mg/dL    Comment: Performed at [facility] [address], [place],  [address]  CBC with Differential/Platelet     Status: Abnormal   Collection Time: 07/09/20  6:26 AM  Result Value Ref Range   WBC 7.3 4.0 - 10.5 K/uL   RBC 3.11 (L) 4.22 - 5.81 MIL/uL   Hemoglobin 10.2 (L) 13.0 - 17.0 g/dL  HCT 29.8 (L) 39 - 52 %   MCV 95.8 80.0 - 100.0 fL   MCH 32.8 26.0 - 34.0 pg   MCHC 34.2 30.0 - 36.0 g/dL   RDW 12.2 11.5 - 15.5 %   Platelets 586 (H) 150 - 400 K/uL   nRBC 0.0 0.0 - 0.2 %   Neutrophils Relative % 70 %   Neutro Abs 5.1 1.7 - 7.7 K/uL   Lymphocytes Relative 16 %   Lymphs Abs 1.1 0.7 - 4.0 K/uL   Monocytes Relative 12 %   Monocytes Absolute 0.9 0.1 - 1.0 K/uL   Eosinophils Relative 1 %   Eosinophils Absolute 0.1 0.0 - 0.5 K/uL   Basophils Relative 1 %   Basophils Absolute 0.0 0.0 - 0.1 K/uL   Immature Granulocytes 0 %   Abs Immature Granulocytes 0.02 0.00 - 0.07 K/uL    Comment: Performed at [facility] [address], [place], [place] [address]  Basic metabolic panel     Status: Abnormal   Collection Time: 07/09/20  6:26 AM  Result Value Ref Range   Sodium 138 135 - 145 mmol/L   Potassium 3.3 (L) 3.5 - 5.1 mmol/L   Chloride 107 98 - 111 mmol/L   CO2 24 22 - 32 mmol/L   Glucose, Bld 104 (H) 70 - 99 mg/dL    Comment: Glucose reference range applies only to samples taken after fasting for at least 8 hours.   BUN 9 8 - 23 mg/dL   Creatinine, Ser 0.64 0.61 - 1.24 mg/dL   Calcium 8.0 (L) 8.9 - 10.3 mg/dL   GFR, Estimated >60 >60 mL/min   Anion gap 7 5 - 15    Comment: Performed at [facility] [address] [place], [place] [address]  Magnesium     Status: None   Collection Time: 07/09/20  4:57 PM  Result Value Ref Range   Magnesium 2.0 1.7 - 2.4 mg/dL    Comment: Performed at [facility], [place] [address] [place], [place] [address]   Phosphorus     Status: Abnormal   Collection Time: 07/09/20  4:57 PM  Result Value Ref Range   Phosphorus 2.3 (L) 2.5 - 4.6 mg/dL    Comment: Performed at [facility] [address] [place], [place] [address]   US Abdomen Limited RUQ  Result Date: 07/08/2020 CLINICAL DATA:  Abnormal liver function tests EXAM: ULTRASOUND ABDOMEN LIMITED RIGHT UPPER QUADRANT COMPARISON:  None. FINDINGS: Gallbladder: No gallstones or wall thickening visualized. No sonographic Murphy sign noted by sonographer. Common bile duct: Diameter: 4 mm in proximal diameter Liver: No focal lesion identified. Within normal limits in parenchymal echogenicity. Portal vein is patent on color Doppler imaging with normal direction of blood flow towards the liver. Other: None. IMPRESSION: Normal examination Electronically Signed   By: Fidela Salisbury MD   On: 07/08/2020 02:48   Medical Problem List and Plan: 1.  Left side weakness and slurred speech secondary to right MCA infarction due to right ICA and MCA occlusion status post revascularization and stenting  -patient may shower  -ELOS/Goals: 2-3 weeks modI 2.  Antithrombotics: -DVT/anticoagulation: Venous Doppler studies negative.  SCDs  -antiplatelet therapy: Aspirin 81 mg daily, currently off of Brilinta given GIB and ICA has reoccluded 3. Pain Management: Tylenol as needed. Well controlled 4. Mood: Provide emotional support  -antipsychotic agents: N/A 5. Neuropsych: This patient is capable of making decisions on his own behalf. 6. Skin/Wound  Care: Routine skin checks 7. Fluids/Electrolytes/Nutrition: Routine in and outs with follow-up chemistries. K+ 3.3 on 10/14- received 44meq Klor.  8.  Constipation.  MiraLAX daily and Colace twice daily. No BM on this regimen but would like to hold off on additional medications at this time.  9.  Hyperlipidemia.  Lipitor 10.  Gout.  Colchicine twice daily.  Monitor for any gout flareups 11.  Orthostasis/bradycardia.   ProAmatine 5 mg every 8 hours as well as Florinef 0.1 mg daily.  Monitor with increased mobility.  Follow-up per cardiology services 12.  Enterobacter UTI.  Completing course of Bactrim. 13.  Rectal bleeding.  Follow-up GI services.  Currently no plan for endoscopic studies and monitor hemoglobin hematocrit.  Awaiting plan for possible colonoscopy   Lavon Paganini Angiulli, PA-C  I have personally performed a face to face diagnostic evaluation, including, but not limited to relevant history and physical exam findings, of this patient and developed relevant assessment and plan.  Additionally, I have reviewed and concur with the physician assistant's documentation above.  Leeroy Cha, MD

## 2020-07-09 NOTE — Progress Notes (Signed)
Occupational Therapy Treatment Patient Details Name: Tyler Richards MRN: [id number] DOB: [birth year] Today's Date: 07/09/2020    History of present illness Patient is a 69 y/o male who presents with left sided weakness, right gaze and slurred speech. NIH:17. Head CT- dense Right MCA infarct. CTA- Right ICA and MCA occlusions. s/p right MCA thrombectomy and right ICA stent placement 06/29/20 with post procedure hemmorhage. No PMH.   OT comments  Patient continues to make steady progress towards goals in skilled OT session. Patient's session encompassed co-treat with PT in order to progress with functional mobility, transfers, and dynamic balance. Of note, OTA further assessed vision with emphasis on L visual neglect, where pt continues to demonstrate deficits on L, but due to attention unable to fully assess scope of L field cut, attempted convergence test multiple times, with no convergence noted, with max cues, pt would look down, but never converged. Pt continues to require max A of 2 in order to come to standing EOB, but is self limiting due to anxiety of fear of falling and syncope (VSS throughout). Discharge remains appropriate, will continue to follow acutely.    Follow Up Recommendations  CIR;Supervision/Assistance - 24 hour    Equipment Recommendations  3 in 1 bedside commode;Wheelchair (measurements OT);Wheelchair cushion (measurements OT);Other (comment)    Recommendations for Other Services      Precautions / Restrictions Precautions Precautions: Fall Precaution Comments: monitor HR and BP - watch for bradycardia, condom catheter Restrictions Weight Bearing Restrictions: No       Mobility Bed Mobility Overal bed mobility: Needs Assistance Bed Mobility: Supine to Sit;Sit to Supine     Supine to sit: Mod assist;+2 for physical assistance Sit to supine: Mod assist;+2 for physical assistance      Transfers Overall transfer level: Needs assistance Equipment used: 2 person  hand held assist;1 person hand held assist Transfers: Sit to/from Stand Sit to Stand: Max assist;+2 physical assistance         General transfer comment: PT provides LLE knee block, bilateral UE support from PT/OT, impaired hip extension    Balance Overall balance assessment: Needs assistance Sitting-balance support: Single extremity supported;Feet supported Sitting balance-Leahy Scale: Poor Sitting balance - Comments: minA, mod-maxA with loss of attention and without UE support Postural control: Left lateral lean Standing balance support: Bilateral upper extremity supported Standing balance-Leahy Scale: Zero Standing balance comment: maxA x2                           ADL either performed or assessed with clinical judgement   ADL Overall ADL's : Needs assistance/impaired     Grooming: Wash/dry hands;Wash/dry face;Brushing hair;Sitting;Min guard Grooming Details (indicate cue type and reason): sitting EOB, min gaurd for safety                             Functional mobility during ADLs: Maximal assistance;+2 for physical assistance;+2 for safety/equipment General ADL Comments: Continues to make functional gains, but is max A for any bilateral ADL or progressing further than sitting EOB     Vision   Tracking/Visual Pursuits: Decreased smoothness of horizontal tracking;Impaired - to be further tested in functional context;Left eye does not track laterally;Right eye does not track medially Convergence: Other (comment) Visual Fields: Impaired-to be further tested in functional context (L inattention and neglect) Additional Comments: Continues to demonstrate deficits on L, but due to attention unable to fully assess scope  of L field cut, attempted convergence test multiple times, with no convergence noted, with max cues, pt would look down, but never converged   Perception     Praxis      Cognition Arousal/Alertness: Awake/alert Behavior During Therapy:  Anxious (mild anxiety with fear of falling and syncope) Overall Cognitive Status: Impaired/Different from baseline Area of Impairment: Attention;Memory;Following commands;Safety/judgement;Awareness;Problem solving                   Current Attention Level: Selective   Following Commands: Follows one step commands consistently;Follows multi-step commands with increased time Safety/Judgement: Decreased awareness of safety;Decreased awareness of deficits Awareness: Emergent Problem Solving: Slow processing          Exercises Other Exercises Other Exercises: dynamic reaching in sitting at edge of bed outside of base of support   Shoulder Instructions       General Comments VSS, pt denies symptoms of dizziness/lightheadedness but remains anxious about falling and syncopal episodes    Pertinent Vitals/ Pain       Pain Assessment: Faces Faces Pain Scale: No hurt  Home Living                                          Prior Functioning/Environment              Frequency  Min 2X/week        Progress Toward Goals  OT Goals(current goals can now be found in the care plan section)  Progress towards OT goals: Progressing toward goals  Acute Rehab OT Goals Patient Stated Goal: to get better OT Goal Formulation: With patient Time For Goal Achievement: 07/14/20 Potential to Achieve Goals: Good  Plan Discharge plan remains appropriate    Co-evaluation    PT/OT/SLP Co-Evaluation/Treatment: Yes Reason for Co-Treatment: Complexity of the patient's impairments (multi-system involvement);For patient/therapist safety;To address functional/ADL transfers PT goals addressed during session: Mobility/safety with mobility;Balance OT goals addressed during session: ADL's and self-care      AM-PAC OT "6 Clicks" Daily Activity     Outcome Measure   Help from another person eating meals?: A Little Help from another person taking care of personal grooming?:  A Little Help from another person toileting, which includes using toliet, bedpan, or urinal?: A Lot Help from another person bathing (including washing, rinsing, drying)?: A Lot Help from another person to put on and taking off regular upper body clothing?: Total   6 Click Score: 11    End of Session Equipment Utilized During Treatment: Gait belt  OT Visit Diagnosis: Other abnormalities of gait and mobility (R26.89);Hemiplegia and hemiparesis;Other symptoms and signs involving cognitive function;Other symptoms and signs involving the nervous system (R29.898) Hemiplegia - Right/Left: Left Hemiplegia - dominant/non-dominant: Non-Dominant Hemiplegia - caused by: Cerebral infarction   Activity Tolerance Patient tolerated treatment well   Patient Left in bed;with call bell/phone within reach;with family/visitor present   Nurse Communication Mobility status;Other (comment) (New condom catheter)        Time: 1030-1102 OT Time Calculation (min): 32 min  Charges: OT General Charges $OT Visit: 1 Visit OT Treatments $Self Care/Home Management : 8-22 mins  Corinne Ports E. Liberta Gimpel, COTA/L Acute Rehabilitation Services [place] 07/09/2020, 12:38 PM

## 2020-07-09 NOTE — Progress Notes (Signed)
IP rehab admissions - Noted patient is medically ready and has orders to DC to CIR.  I met with patient and wife at the bedside.  They are both in agreement to CIR.  Will admit to CIR today.  Call me for questions.  [phone]

## 2020-07-09 NOTE — Progress Notes (Signed)
Pt arrived to 4M02 per bed. Discussed plan of care, reviewed therapy schedule, reviewed medications. Pt states understanding. Alert and oriented x4. No known complications at this time. Sheela Stack, LPN

## 2020-07-09 NOTE — Progress Notes (Signed)
Physical Therapy Treatment Patient Details Name: Tyler Richards MRN: [id number] DOB: 15-Jul-1951 Today's Date: 07/09/2020    History of Present Illness Patient is a 69 y/o male who presents with left sided weakness, right gaze and slurred speech. NIH:17. Head CT- dense Right MCA infarct. CTA- Right ICA and MCA occlusions. s/p right MCA thrombectomy and right ICA stent placement 06/29/20 with post procedure hemmorhage. No PMH.    PT Comments    Pt tolerates treatment well despite continued anxiety about falling and syncope. Pt continues to require significant assistance for all transfers and bed mobility and demonstrates very poor sitting balance with loss of attention. Pt will continue to benefit from acute PT POC to improve activity tolerance and balance in sitting and standing. PT continues to recommend CIR placement at the time of discharge.   Follow Up Recommendations  CIR;Supervision/Assistance - 24 hour     Equipment Recommendations  Wheelchair (measurements PT);Wheelchair cushion (measurements PT);Hospital bed (mechanical lift, all if D/C home)    Recommendations for Other Services       Precautions / Restrictions Precautions Precautions: Fall Precaution Comments: monitor HR and BP - watch for bradycardia, condom catheter Restrictions Weight Bearing Restrictions: No    Mobility  Bed Mobility Overal bed mobility: Needs Assistance Bed Mobility: Supine to Sit;Sit to Supine     Supine to sit: Mod assist;+2 for physical assistance Sit to supine: Mod assist;+2 for physical assistance      Transfers Overall transfer level: Needs assistance Equipment used: 2 person hand held assist;1 person hand held assist Transfers: Sit to/from Stand Sit to Stand: Max assist;+2 physical assistance         General transfer comment: PT provides LLE knee block, bilateral UE support from PT/OT, impaired hip extension  Ambulation/Gait                 Stairs              Wheelchair Mobility    Modified Rankin (Stroke Patients Only) Modified Rankin (Stroke Patients Only) Pre-Morbid Rankin Score: No symptoms Modified Rankin: Severe disability     Balance Overall balance assessment: Needs assistance Sitting-balance support: Single extremity supported;Feet supported Sitting balance-Leahy Scale: Poor Sitting balance - Comments: minA, mod-maxA with loss of attention and without UE support Postural control: Left lateral lean Standing balance support: Bilateral upper extremity supported Standing balance-Leahy Scale: Zero Standing balance comment: maxA x2                            Cognition Arousal/Alertness: Awake/alert Behavior During Therapy: Anxious (mild anxiety with fear of falling and syncope) Overall Cognitive Status: Impaired/Different from baseline Area of Impairment: Attention;Memory;Following commands;Safety/judgement;Awareness;Problem solving                   Current Attention Level: Selective   Following Commands: Follows one step commands consistently;Follows multi-step commands with increased time Safety/Judgement: Decreased awareness of safety;Decreased awareness of deficits Awareness: Emergent Problem Solving: Slow processing        Exercises Other Exercises Other Exercises: dynamic reaching in sitting at edge of bed outside of base of support    General Comments General comments (skin integrity, edema, etc.): VSS, pt denies symptoms of dizziness/lightheadedness but remains anxious about falling and syncopal episodes      Pertinent Vitals/Pain Pain Assessment: Faces Faces Pain Scale: No hurt    Home Living  Prior Function            PT Goals (current goals can now be found in the care plan section) Acute Rehab PT Goals Patient Stated Goal: to get better Progress towards PT goals: Progressing toward goals    Frequency    Min 4X/week      PT Plan Current  plan remains appropriate    Co-evaluation PT/OT/SLP Co-Evaluation/Treatment: Yes Reason for Co-Treatment: Complexity of the patient's impairments (multi-system involvement);For patient/therapist safety;To address functional/ADL transfers PT goals addressed during session: Mobility/safety with mobility;Balance OT goals addressed during session: ADL's and self-care      AM-PAC PT "6 Clicks" Mobility   Outcome Measure  Help needed turning from your back to your side while in a flat bed without using bedrails?: A Lot Help needed moving from lying on your back to sitting on the side of a flat bed without using bedrails?: A Lot Help needed moving to and from a bed to a chair (including a wheelchair)?: Total Help needed standing up from a chair using your arms (e.g., wheelchair or bedside chair)?: Total Help needed to walk in hospital room?: Total Help needed climbing 3-5 steps with a railing? : Total 6 Click Score: 8    End of Session Equipment Utilized During Treatment: Gait belt Activity Tolerance: Patient tolerated treatment well Patient left: in bed;with call bell/phone within reach;with bed alarm set;with family/visitor present Nurse Communication: Mobility status;Need for lift equipment PT Visit Diagnosis: Hemiplegia and hemiparesis;Other abnormalities of gait and mobility (R26.89);Muscle weakness (generalized) (M62.81);Difficulty in walking, not elsewhere classified (R26.2);Unsteadiness on feet (R26.81) Hemiplegia - Right/Left: Left Hemiplegia - dominant/non-dominant: Non-dominant Hemiplegia - caused by: Cerebral infarction     Time: 1030-1101 PT Time Calculation (min) (ACUTE ONLY): 31 min  Charges:  $Therapeutic Activity: 8-22 mins                     Zenaida Niece, PT, DPT Acute Rehabilitation Pager: [phone]    Zenaida Niece 07/09/2020, 12:34 PM

## 2020-07-09 NOTE — Progress Notes (Signed)
Izora Ribas, MD  Physician  Physical Medicine and Rehabilitation  Consult Note     Signed  Date of Service:  07/01/2020 5:14 AM      Related encounter: ED to Hosp-Admission (Discharged) from 06/29/2020 in [facility]      Signed      Expand All Collapse All  Show:Clear all [x] Manual[x] Template[] Copied  Added by: [x] Angiulli, Lavon Paganini, PA-C[x] Raulkar, Clide Deutscher, MD  [] Hover for details      Physical Medicine and Rehabilitation Consult Reason for Consult: Left side weakness and slurred speech Referring Physician: Dr.Xu   HPI: Tyler Richards is a 69 y.o. right-handed male with unremarkable past medical history on no prescription medications.  Per chart review lives with spouse independent and active prior to admission 1 level home 2 steps to entry.  Presented 06/29/2020 with acute onset of left-sided weakness and slurred speech.  Cranial CT scan showed hyperdense distal right ICA and proximal MCA.  Blunted appearance of the posterior right putamen.  Chronic right high frontal cortex infarct.  Patient did not receive TPA.  CT angiogram of head and neck emergent large vessel occlusion with no flow seen in the right internal carotid or proximal MCA.  Patient underwent right MCA thrombectomy and right ICA stent placement 06/29/2020 per interventional radiology.  Most recent MRI and imaging revealed acute infarct right basal ganglia with nonprogressive hemorrhage when correlated with a prior CT 06/29/2020.  Lower extremity Doppler showed no signs of DVT.  Carotid Dopplers with 1 to 39% ICA stenosis.  Echocardiogram with ejection fraction of 50 to 63% grade 1 diastolic dysfunction.  Admission chemistries were unremarkable aside glucose 104, urine drug screen negative.  Currently maintained on aspirin for CVA prophylaxis as well as Brilinta.  Patient was extubated 06/30/2020.  Therapy evaluations completed 06/30/2020 with recommendations of physical medicine rehab  consult.   Review of Systems  Constitutional: Negative for chills and fever.  HENT: Negative for hearing loss.   Eyes: Negative for blurred vision and double vision.  Respiratory: Negative for cough and shortness of breath.   Cardiovascular: Negative for chest pain, palpitations and leg swelling.  Gastrointestinal: Positive for constipation. Negative for heartburn, nausea and vomiting.  Genitourinary: Negative for dysuria, flank pain and hematuria.  Musculoskeletal: Positive for myalgias.  Skin: Negative for rash.  Neurological: Positive for speech change and weakness.  All other systems reviewed and are negative.  History reviewed. No pertinent past medical history.      Past Surgical History:  Procedure Laterality Date  . RADIOLOGY WITH ANESTHESIA N/A 06/29/2020   Procedure: IR WITH ANESTHESIA;  Surgeon: Radiologist, Medication, MD;  Location: [facility];  Service: Radiology;  Laterality: N/A;   History reviewed. No pertinent family history. Social History:  reports that he has never smoked. He has never used smokeless tobacco. He reports previous alcohol use. He reports that he does not use drugs. Allergies: No Known Allergies       Medications Prior to Admission  Medication Sig Dispense Refill  . acetaminophen (TYLENOL) 325 MG tablet Take 650 mg by mouth every 6 (six) hours as needed for mild pain or headache.    Marland Kitchen aspirin EC 81 MG tablet Take 81 mg by mouth daily as needed for mild pain. Swallow whole.      Home: Home Living Family/patient expects to be discharged to:: Private residence (modular home) Living Arrangements: Spouse/significant other Available Help at Discharge: Family, Available 24 hours/day Type of Home: House Home Access: Stairs to  enter Entrance Stairs-Number of Steps: 2 Entrance Stairs-Rails: Right Home Layout: One level Bathroom Shower/Tub: Chiropodist: Standard Home Equipment: Grab bars - tub/shower, Cane - single point,  Environmental consultant - 2 wheels, Wheelchair - manual  Functional History: Prior Function Level of Independence: Independent Comments: Independent for ADLs, walking. Drives. Functional Status:  Mobility: Bed Mobility Overal bed mobility: Needs Assistance Bed Mobility: Rolling, Sidelying to Sit Rolling: Mod assist, +2 for physical assistance Sidelying to sit: Max assist, +2 for physical assistance, HOB elevated General bed mobility comments: Step by step cues for sequencing; to push through RLE to scoot bottom, assist with LLE and elevate trunk with cues to reach for rail Transfers Overall transfer level: Needs assistance Equipment used: 2 person hand held assist Transfers: Sit to/from Stand Sit to Stand: Max assist, +2 physical assistance General transfer comment: Assist of 2 to power to standing with cues for anterior translation. Flexed posture and not able to extend through trunk/hips despite cues. Ambulation/Gait General Gait Details: Unable  ADL:  Cognition: Cognition Overall Cognitive Status: Impaired/Different from baseline Orientation Level: Intubated/Tracheostomy - Unable to assess Cognition Arousal/Alertness: Awake/alert Behavior During Therapy: WFL for tasks assessed/performed Overall Cognitive Status: Impaired/Different from baseline Area of Impairment: Problem solving, Attention, Following commands Current Attention Level: Sustained Following Commands: Follows one step commands with increased time, Follows multi-step commands inconsistently Problem Solving: Slow processing, Decreased initiation, Difficulty sequencing, Requires verbal cues, Requires tactile cues General Comments: A&Ox4; requires repetition and step by step cues for sequencing at times. Left inattention. Difficulty reading clock.  Blood pressure (!) 158/57, pulse 61, temperature 99.2 F (37.3 C), temperature source Oral, resp. rate 16, height 6\' 2"  (1.88 m), weight 85.1 kg, SpO2 100 %. Physical Exam General:  Alert and oriented x 3, No apparent distress HEENT: Head is normocephalic, atraumatic, PERRLA, EOMI, sclera anicteric, oral mucosa pink and moist, dentition intact, ext ear canals clear,  Neck: Supple without JVD or lymphadenopathy Heart: Reg rate and rhythm. No murmurs rubs or gallops Chest: +cough, breathing comfortably Abdomen: Soft, non-tender, non-distended, bowel sounds positive. Extremities: No clubbing, cyanosis, or edema. Pulses are 2+ Skin: Clean and intact without signs of breakdown Neuro: Alert and oriented x3. Also able to state birthday, reason he is here. Patient is a bit lethargic but arousable.  Left-sided inattention.  Follows simple commands.  Examination overall limited due to lethargy. Right sided strength 4/5. LU 1 SA and 0/5 distally Musculoskeletal: Full ROM, No pain with AROM or PROM in the neck, trunk, or extremities. Posture appropriate Psych: [name] affect is appropriate. Pt is cooperative   Lab Results Last 24 Hours       Results for orders placed or performed during the hospital encounter of 06/29/20 (from the past 24 hour(s))  MRSA PCR Screening     Status: None   Collection Time: 06/29/20  5:28 PM   Specimen: Nasal Mucosa; Nasopharyngeal  Result Value Ref Range   MRSA by PCR NEGATIVE NEGATIVE  I-STAT 7, (LYTES, BLD GAS, ICA, H+H)     Status: Abnormal   Collection Time: 06/29/20  5:38 PM  Result Value Ref Range   pH, Arterial 7.380 7.35 - 7.45   pCO2 arterial 41.3 32 - 48 mmHg   pO2, Arterial 252 (H) 83 - 108 mmHg   Bicarbonate 24.4 20.0 - 28.0 mmol/L   TCO2 26 22 - 32 mmol/L   O2 Saturation 100.0 %   Acid-base deficit 1.0 0.0 - 2.0 mmol/L   Sodium 138 135 - 145 mmol/L  Potassium 3.8 3.5 - 5.1 mmol/L   Calcium, Ion 1.21 1.15 - 1.40 mmol/L   HCT 35.0 (L) 39 - 52 %   Hemoglobin 11.9 (L) 13.0 - 17.0 g/dL   Collection site Radial    Drawn by RT    Sample type ARTERIAL   Urine rapid drug screen (hosp performed)     Status: None     Collection Time: 06/29/20  5:49 PM  Result Value Ref Range   Opiates NONE DETECTED NONE DETECTED   Cocaine NONE DETECTED NONE DETECTED   Benzodiazepines NONE DETECTED NONE DETECTED   Amphetamines NONE DETECTED NONE DETECTED   Tetrahydrocannabinol NONE DETECTED NONE DETECTED   Barbiturates NONE DETECTED NONE DETECTED  Urinalysis, Routine w reflex microscopic Urine, Catheterized     Status: Abnormal   Collection Time: 06/29/20  5:49 PM  Result Value Ref Range   Color, Urine YELLOW YELLOW   APPearance CLEAR CLEAR   Specific Gravity, Urine 1.044 (H) 1.005 - 1.030   pH 5.0 5.0 - 8.0   Glucose, UA NEGATIVE NEGATIVE mg/dL   Hgb urine dipstick MODERATE (A) NEGATIVE   Bilirubin Urine NEGATIVE NEGATIVE   Ketones, ur NEGATIVE NEGATIVE mg/dL   Protein, ur NEGATIVE NEGATIVE mg/dL   Nitrite NEGATIVE NEGATIVE   Leukocytes,Ua NEGATIVE NEGATIVE   RBC / HPF 0-5 0 - 5 RBC/hpf   WBC, UA 0-5 0 - 5 WBC/hpf   Bacteria, UA RARE (A) NONE SEEN   Squamous Epithelial / LPF 0-5 0 - 5   Mucus PRESENT   HIV Antibody (routine testing w rflx)     Status: None   Collection Time: 06/30/20 12:43 AM  Result Value Ref Range   HIV Screen 4th Generation wRfx Non Reactive Non Reactive  Hemoglobin A1c     Status: None   Collection Time: 06/30/20 12:43 AM  Result Value Ref Range   Hgb A1c MFr Bld 5.6 4.8 - 5.6 %   Mean Plasma Glucose 114.02 mg/dL  Lipid panel     Status: Abnormal   Collection Time: 06/30/20 12:43 AM  Result Value Ref Range   Cholesterol 149 0 - 200 mg/dL   Triglycerides 167 (H) <150 mg/dL   HDL 23 (L) >40 mg/dL   Total CHOL/HDL Ratio 6.5 RATIO   VLDL 33 0 - 40 mg/dL   LDL Cholesterol 93 0 - 99 mg/dL  Triglycerides     Status: Abnormal   Collection Time: 06/30/20 12:43 AM  Result Value Ref Range   Triglycerides 168 (H) <150 mg/dL  CBC with Differential/Platelet     Status: Abnormal   Collection Time: 06/30/20 12:43 AM  Result Value Ref Range   WBC  9.7 4.0 - 10.5 K/uL   RBC 3.73 (L) 4.22 - 5.81 MIL/uL   Hemoglobin 12.2 (L) 13.0 - 17.0 g/dL   HCT 36.2 (L) 39 - 52 %   MCV 97.1 80.0 - 100.0 fL   MCH 32.7 26.0 - 34.0 pg   MCHC 33.7 30.0 - 36.0 g/dL   RDW 12.6 11.5 - 15.5 %   Platelets 380 150 - 400 K/uL   nRBC 0.0 0.0 - 0.2 %   Neutrophils Relative % 75 %   Neutro Abs 7.2 1.7 - 7.7 K/uL   Lymphocytes Relative 11 %   Lymphs Abs 1.1 0.7 - 4.0 K/uL   Monocytes Relative 14 %   Monocytes Absolute 1.4 (H) 0 - 1 K/uL   Eosinophils Relative 0 %   Eosinophils Absolute 0.0 0 - 0 K/uL  Basophils Relative 0 %   Basophils Absolute 0.0 0 - 0 K/uL   Immature Granulocytes 0 %   Abs Immature Granulocytes 0.02 0.00 - 0.07 K/uL  Basic metabolic panel     Status: Abnormal   Collection Time: 06/30/20 12:43 AM  Result Value Ref Range   Sodium 138 135 - 145 mmol/L   Potassium 3.9 3.5 - 5.1 mmol/L   Chloride 106 98 - 111 mmol/L   CO2 20 (L) 22 - 32 mmol/L   Glucose, Bld 113 (H) 70 - 99 mg/dL   BUN 10 8 - 23 mg/dL   Creatinine, Ser 0.89 0.61 - 1.24 mg/dL   Calcium 8.4 (L) 8.9 - 10.3 mg/dL   GFR calc non Af Amer >60 >60 mL/min   GFR calc Af Amer >60 >60 mL/min   Anion gap 12 5 - 15      Imaging Results (Last 48 hours)  CT Code Stroke CTA Head W/WO contrast  Result Date: 06/29/2020 CLINICAL DATA:  Hyperdense vessel and stroke-like symptoms EXAM: CT ANGIOGRAPHY HEAD AND NECK CT PERFUSION BRAIN TECHNIQUE: Multidetector CT imaging of the head and neck was performed using the standard protocol during bolus administration of intravenous contrast. Multiplanar CT image reconstructions and MIPs were obtained to evaluate the vascular anatomy. Carotid stenosis measurements (when applicable) are obtained utilizing NASCET criteria, using the distal internal carotid diameter as the denominator. Multiphase CT imaging of the brain was performed following IV bolus contrast injection. Subsequent parametric perfusion maps were  calculated using RAPID software. CONTRAST:  Dose is not yet known COMPARISON:  None. FINDINGS: CTA NECK FINDINGS Aortic arch: Mild atheromatous changes. Three vessel branching. No acute finding. Right carotid system: Atherosclerotic plaque about the bifurcation with occluded ICA bulb. No flow seen within the ICA in the neck. Left carotid system: Atheromatous plaque at the bifurcation and bulb. No stenosis or ulceration. Negative for beading. Vertebral arteries: Low-density plaque at the left subclavian origin. No flow limiting subclavian stenosis. Plaque at the left vertebral origin without stenosis, beading, or dissection. Skeleton: Focal advanced C5-6 disc degeneration with ridging causing biforaminal impingement. Ordinary facet osteoarthritis. Other neck: No acute or aggressive finding. Right-sided chronic sinusitis with pattern middle meatus obstruction. Upper chest: Granulomatous nodal calcifications. There is also a calcified granuloma appearance in the left upper lobe. Review of the MIP images confirms the above findings CTA HEAD FINDINGS Anterior circulation: No flow seen in the right carotid or MCA branches. There is distal M2 M3 branch reconstitution. Calcified plaque along the left carotid siphon. No MCA branch occlusion, aneurysm, or beading. Posterior circulation: The vertebral and basilar arteries are smooth and widely patent. High-grade atheromatous narrowings of the bilateral PCA, P3 segment on the right and upper first order branch on the left. Venous sinuses: Patent as permitted by contrast timing Anatomic variants: None significant Review of the MIP images confirms the above findings CT Brain Perfusion Findings: ASPECTS: 9 CBF (<30%) Volume: 27mL Perfusion (Tmax>6.0s) volume: 171mL Mismatch Volume: 184mL Infarction Location:Right basal ganglia and lesser extensive lateral frontal cortex infarct. Critical Value/emergent results were called by telephone at the time of interpretation on 06/29/2020 at  9:37 am to provider ERIC [facility] , who verbally acknowledged these results. IMPRESSION: 1. Emergent large vessel occlusion with no flow seen in the right internal carotid or proximal MCA. There is a 36 cc core infarct and 142 cc of penumbra by CT perfusion. 2. Atherosclerosis without flow limiting stenosis of the other major vessels. 3. Bilateral high-grade atheromatous PCA narrowings,  more proximal on the right. 4. 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 Electronically Signed   By: Monte Fantasia M.D.   On: 06/29/2020 09:53   CT HEAD WO CONTRAST  Result Date: 06/29/2020 CLINICAL DATA:  Follow-up stroke and subsequent intervention. EXAM: CT HEAD WITHOUT CONTRAST TECHNIQUE: Contiguous axial images were obtained from the base of the skull through the vertex without intravenous contrast. COMPARISON:  Earlier same day FINDINGS: Brain: 3.9 x 3.0 x 3.0 cm hyperdense region in the right posterior basal ganglia most consistent with a parenchymal hematoma, given small internal fluid levels. Mild mass effect and surrounding edema. No midline shift. Chronic ischemic changes in the right posterior frontal region as seen previously. Vascular: No other new vascular finding. Skull: Negative Sinuses/Orbits: Opacified right maxillary and ethmoid sinuses. Orbits negative. Other: None IMPRESSION: 1. 3.9 x 3.0 x 3.0 cm hyperdense region in the right posterior basal ganglia most consistent with parenchymal hematoma, given small internal fluid levels. Mild mass effect and surrounding edema. No midline shift. 2. Chronic ischemic changes in the right posterior frontal region as seen previously. 3. Opacified right maxillary and ethmoid sinuses. Electronically Signed   By: Nelson Chimes M.D.   On: 06/29/2020 18:48   CT Code Stroke CTA Neck W/WO contrast  Result Date: 06/29/2020 CLINICAL DATA:  Hyperdense vessel and stroke-like symptoms EXAM: CT ANGIOGRAPHY HEAD AND NECK CT PERFUSION BRAIN  TECHNIQUE: Multidetector CT imaging of the head and neck was performed using the standard protocol during bolus administration of intravenous contrast. Multiplanar CT image reconstructions and MIPs were obtained to evaluate the vascular anatomy. Carotid stenosis measurements (when applicable) are obtained utilizing NASCET criteria, using the distal internal carotid diameter as the denominator. Multiphase CT imaging of the brain was performed following IV bolus contrast injection. Subsequent parametric perfusion maps were calculated using RAPID software. CONTRAST:  Dose is not yet known COMPARISON:  None. FINDINGS: CTA NECK FINDINGS Aortic arch: Mild atheromatous changes. Three vessel branching. No acute finding. Right carotid system: Atherosclerotic plaque about the bifurcation with occluded ICA bulb. No flow seen within the ICA in the neck. Left carotid system: Atheromatous plaque at the bifurcation and bulb. No stenosis or ulceration. Negative for beading. Vertebral arteries: Low-density plaque at the left subclavian origin. No flow limiting subclavian stenosis. Plaque at the left vertebral origin without stenosis, beading, or dissection. Skeleton: Focal advanced C5-6 disc degeneration with ridging causing biforaminal impingement. Ordinary facet osteoarthritis. Other neck: No acute or aggressive finding. Right-sided chronic sinusitis with pattern middle meatus obstruction. Upper chest: Granulomatous nodal calcifications. There is also a calcified granuloma appearance in the left upper lobe. Review of the MIP images confirms the above findings CTA HEAD FINDINGS Anterior circulation: No flow seen in the right carotid or MCA branches. There is distal M2 M3 branch reconstitution. Calcified plaque along the left carotid siphon. No MCA branch occlusion, aneurysm, or beading. Posterior circulation: The vertebral and basilar arteries are smooth and widely patent. High-grade atheromatous narrowings of the bilateral PCA, P3  segment on the right and upper first order branch on the left. Venous sinuses: Patent as permitted by contrast timing Anatomic variants: None significant Review of the MIP images confirms the above findings CT Brain Perfusion Findings: ASPECTS: 9 CBF (<30%) Volume: 59mL Perfusion (Tmax>6.0s) volume: 151mL Mismatch Volume: 11mL Infarction Location:Right basal ganglia and lesser extensive lateral frontal cortex infarct. Critical Value/emergent results were called by telephone at the time of interpretation on 06/29/2020 at 9:37 am to provider ERIC [facility] , who  verbally acknowledged these results. IMPRESSION: 1. Emergent large vessel occlusion with no flow seen in the right internal carotid or proximal MCA. There is a 36 cc core infarct and 142 cc of penumbra by CT perfusion. 2. Atherosclerosis without flow limiting stenosis of the other major vessels. 3. Bilateral high-grade atheromatous PCA narrowings, more proximal on the right. 4. 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 Electronically Signed   By: Monte Fantasia M.D.   On: 06/29/2020 09:53   MR BRAIN WO CONTRAST  Result Date: 06/30/2020 CLINICAL DATA:  Stroke follow-up EXAM: MRI HEAD WITHOUT CONTRAST TECHNIQUE: Multiplanar, multiecho pulse sequences of the brain and surrounding structures were obtained without intravenous contrast. COMPARISON:  Head CT and CTA from yesterday FINDINGS: Brain: Acute infarction at the right stratum and patchy along the insula and lateral frontal cortex. The area of infarction appears similar to the area of pre treatment core infarct. Hematoma centered at the right putamen with dimensions better assessed by prior CT, grossly similar at to 3.5 cm anterior to posterior. Correlating to fluid fluid level, there is a posterior dense clot appearance and more heterogeneous anterior component. On gradient imaging petechial hemorrhage is seen along the right MCA watershed. No hydrocephalus or shift.  Remote high right frontal cortex infarct anteriorly. Vascular: No flow voids seen in the left ICA beginning in the upper cervical spine. There is normalization by the supraclinoid segment. Skull and upper cervical spine: No focal marrow lesion Sinuses/Orbits: Right middle meatus obstruction with frontal, ethmoid, and especially maxillary sinusitis on the right. IMPRESSION: 1. Acute infarct at the right basal ganglia with nonprogressive hemorrhage when correlated with prior CT. Less extensive patchy acute cortical infarct in the right MCA territory. The areas of acute infarction correlate well with pretreatment CTP. 2. 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 Electronically Signed   By: Monte Fantasia M.D.   On: 06/30/2020 04:22   CT Code Stroke Cerebral Perfusion with contrast  Result Date: 06/29/2020 CLINICAL DATA:  Hyperdense vessel and stroke-like symptoms EXAM: CT ANGIOGRAPHY HEAD AND NECK CT PERFUSION BRAIN TECHNIQUE: Multidetector CT imaging of the head and neck was performed using the standard protocol during bolus administration of intravenous contrast. Multiplanar CT image reconstructions and MIPs were obtained to evaluate the vascular anatomy. Carotid stenosis measurements (when applicable) are obtained utilizing NASCET criteria, using the distal internal carotid diameter as the denominator. Multiphase CT imaging of the brain was performed following IV bolus contrast injection. Subsequent parametric perfusion maps were calculated using RAPID software. CONTRAST:  Dose is not yet known COMPARISON:  None. FINDINGS: CTA NECK FINDINGS Aortic arch: Mild atheromatous changes. Three vessel branching. No acute finding. Right carotid system: Atherosclerotic plaque about the bifurcation with occluded ICA bulb. No flow seen within the ICA in the neck. Left carotid system: Atheromatous plaque at the bifurcation and bulb. No stenosis or ulceration. Negative for  beading. Vertebral arteries: Low-density plaque at the left subclavian origin. No flow limiting subclavian stenosis. Plaque at the left vertebral origin without stenosis, beading, or dissection. Skeleton: Focal advanced C5-6 disc degeneration with ridging causing biforaminal impingement. Ordinary facet osteoarthritis. Other neck: No acute or aggressive finding. Right-sided chronic sinusitis with pattern middle meatus obstruction. Upper chest: Granulomatous nodal calcifications. There is also a calcified granuloma appearance in the left upper lobe. Review of the MIP images confirms the above findings CTA HEAD FINDINGS Anterior circulation: No flow seen in the right carotid or MCA branches. There is distal  M2 M3 branch reconstitution. Calcified plaque along the left carotid siphon. No MCA branch occlusion, aneurysm, or beading. Posterior circulation: The vertebral and basilar arteries are smooth and widely patent. High-grade atheromatous narrowings of the bilateral PCA, P3 segment on the right and upper first order branch on the left. Venous sinuses: Patent as permitted by contrast timing Anatomic variants: None significant Review of the MIP images confirms the above findings CT Brain Perfusion Findings: ASPECTS: 9 CBF (<30%) Volume: 51mL Perfusion (Tmax>6.0s) volume: 149mL Mismatch Volume: 181mL Infarction Location:Right basal ganglia and lesser extensive lateral frontal cortex infarct. Critical Value/emergent results were called by telephone at the time of interpretation on 06/29/2020 at 9:37 am to provider ERIC [facility] , who verbally acknowledged these results. IMPRESSION: 1. Emergent large vessel occlusion with no flow seen in the right internal carotid or proximal MCA. There is a 36 cc core infarct and 142 cc of penumbra by CT perfusion. 2. Atherosclerosis without flow limiting stenosis of the other major vessels. 3. Bilateral high-grade atheromatous PCA narrowings, more proximal on the right. 4. Incidental  chronic right-sided sinusitis from middle meatus obstruction. Electronically Signed   By: Monte Fantasia M.D.   On: 06/29/2020 09:53   Portable Chest xray  Result Date: 06/30/2020 CLINICAL DATA:  Respiratory failure.  Stroke. EXAM: PORTABLE CHEST 1 VIEW COMPARISON:  06/29/2020 FINDINGS: 0538 hours. Endotracheal tube tip is 7.4 cm above the base of the carina. The lungs are clear without focal pneumonia, edema, pneumothorax or pleural effusion. The cardiopericardial silhouette is within normal limits for size. The visualized bony structures of the thorax show no acute abnormality. Telemetry leads overlie the chest. IMPRESSION: No acute cardiopulmonary findings. Electronically Signed   By: Misty Stanley M.D.   On: 06/30/2020 07:30   DG CHEST PORT 1 VIEW  Result Date: 06/29/2020 CLINICAL DATA:  Code stroke with coiling intubated EXAM: PORTABLE CHEST 1 VIEW COMPARISON:  None. FINDINGS: Endotracheal tube tip is about 4.2 cm superior to the carina. Esophageal tube tip is below the diaphragm but incompletely visualized. No focal opacity or pleural effusion. Normal cardiac size. No pneumothorax. IMPRESSION: Endotracheal tube tip about 4.2 cm superior to the carina. Lungs grossly clear Electronically Signed   By: Donavan Foil M.D.   On: 06/29/2020 16:00   ECHOCARDIOGRAM COMPLETE  Result Date: 06/30/2020    ECHOCARDIOGRAM REPORT   Patient Name:   Tyler Richards Date of Exam: 06/30/2020 Medical Rec #:  [id number]     Height:       74.0 in Accession #:    [id number]    Weight:       187.6 lb Date of Birth:  1951-01-22     BSA:          2.115 m Patient Age:    1 years      BP:           162/65 mmHg Patient Gender: M             HR:           52 bpm. Exam Location:  Inpatient Procedure: 2D Echo, Cardiac Doppler, Color Doppler and Intracardiac            Opacification Agent Indications:    CVA  History:        Patient has no prior history of Echocardiogram examinations.                 Stroke and COPD,  Signs/Symptoms:Resp. failure; Risk  Factors:Hypertension.  Sonographer:    Dustin Flock Referring Phys: [phone] ERIC LINDZEN  Sonographer Comments: Technically difficult study due to poor echo windows and echo performed with patient supine and on artificial respirator. Image acquisition challenging due to COPD and Image acquisition challenging due to respiratory motion. IMPRESSIONS  1. Left ventricular ejection fraction, by estimation, is 50 to 55%. The left ventricle has low normal function. The left ventricle has no regional wall motion abnormalities. Left ventricular diastolic parameters are consistent with Grade I diastolic dysfunction (impaired relaxation).  2. Right ventricular systolic function is normal. The right ventricular size is normal. There is mildly elevated pulmonary artery systolic pressure.  3. Left atrial size was mildly dilated.  4. The mitral valve is normal in structure. No evidence of mitral valve regurgitation. No evidence of mitral stenosis.  5. The aortic valve is normal in structure. Aortic valve regurgitation is not visualized. No aortic stenosis is present.  6. The inferior vena cava is dilated in size with <50% respiratory variability, suggesting right atrial pressure of 15 mmHg. FINDINGS  Left Ventricle: Left ventricular ejection fraction, by estimation, is 50 to 55%. The left ventricle has low normal function. The left ventricle has no regional wall motion abnormalities. Definity contrast agent was given IV to delineate the left ventricular endocardial borders. The left ventricular internal cavity size was normal in size. There is no left ventricular hypertrophy. Left ventricular diastolic parameters are consistent with Grade I diastolic dysfunction (impaired relaxation). Indeterminate filling pressures. Right Ventricle: The right ventricular size is normal. No increase in right ventricular wall thickness. Right ventricular systolic function is normal. There is  mildly elevated pulmonary artery systolic pressure. The tricuspid regurgitant velocity is 2.43  m/s, and with an assumed right atrial pressure of 15 mmHg, the estimated right ventricular systolic pressure is 00.8 mmHg. Left Atrium: Left atrial size was mildly dilated. Right Atrium: Right atrial size was normal in size. Pericardium: There is no evidence of pericardial effusion. Mitral Valve: The mitral valve is normal in structure. No evidence of mitral valve regurgitation. No evidence of mitral valve stenosis. Tricuspid Valve: The tricuspid valve is normal in structure. Tricuspid valve regurgitation is trivial. No evidence of tricuspid stenosis. Aortic Valve: The aortic valve is normal in structure. Aortic valve regurgitation is not visualized. No aortic stenosis is present. Pulmonic Valve: The pulmonic valve was normal in structure. Pulmonic valve regurgitation is not visualized. No evidence of pulmonic stenosis. Aorta: The aortic root is normal in size and structure. Venous: The inferior vena cava is dilated in size with less than 50% respiratory variability, suggesting right atrial pressure of 15 mmHg. IAS/Shunts: No atrial level shunt detected by color flow Doppler.  LEFT VENTRICLE PLAX 2D LVIDd:         5.30 cm  Diastology LVIDs:         3.80 cm  LV e' medial:    6.85 cm/s LV PW:         1.30 cm  LV E/e' medial:  10.2 LV IVS:        1.00 cm  LV e' lateral:   12.10 cm/s LVOT diam:     2.10 cm  LV E/e' lateral: 5.8 LV SV:         106 LV SV Index:   50 LVOT Area:     3.46 cm  RIGHT VENTRICLE RV Basal diam:  3.80 cm RV S prime:     7.07 cm/s TAPSE (M-mode): 3.3 cm LEFT ATRIUM  Index       RIGHT ATRIUM           Index LA diam:      4.30 cm 2.03 cm/m  RA Area:     21.00 cm LA Vol (A4C): 45.5 ml 21.51 ml/m RA Volume:   67.30 ml  31.82 ml/m  AORTIC VALVE LVOT Vmax:   150.00 cm/s LVOT Vmean:  90.400 cm/s LVOT VTI:    0.305 m  AORTA Ao Root diam: 3.50 cm MITRAL VALVE               TRICUSPID VALVE MV Area  (PHT): 2.80 cm    TR Peak grad:   23.6 mmHg MV Decel Time: 271 msec    TR Vmax:        243.00 cm/s MV E velocity: 69.80 cm/s MV A velocity: 68.60 cm/s  SHUNTS MV E/A ratio:  1.02        Systemic VTI:  0.30 m                            Systemic Diam: 2.10 cm Skeet Latch MD Electronically signed by Skeet Latch MD Signature Date/Time: 06/30/2020/11:25:18 AM    Final    CT HEAD CODE STROKE WO CONTRAST  Result Date: 06/29/2020 CLINICAL DATA:  Code stroke.  Slurred speech and left facial droop EXAM: CT HEAD WITHOUT CONTRAST TECHNIQUE: Contiguous axial images were obtained from the base of the skull through the vertex without intravenous contrast. COMPARISON:  None. FINDINGS: Brain: Small remote appearing cortically based infarcts in the superior right frontal lobe. No hemorrhage, hydrocephalus, or masslike finding. Vascular: Hyperdense distal right ICA to MCA branches. Atherosclerotic calcification. Skull: Negative Sinuses/Orbits: Right maxillary, ethmoid, and frontal sinus opacification with sclerotic wall thickening at the maxillary sinus. Other: Critical Value/emergent results were called by telephone at the time of interpretation on 06/29/2020 at 9:23 am to provider Lindzen , who verbally acknowledged these results. ASPECTS [facility] Stroke Program Early CT Score) - Ganglionic level infarction (caudate, lentiform nuclei, internal capsule, insula, M1-M3 cortex): Posterior putamen appears blunted compared to the left. Equivocal for small insular cortex infarct. - Supraganglionic infarction (M4-M6 cortex): 3, when accounting for chronic infarct Total score (0-10 with 10 being normal): 9, when accounting for chronic infarct IMPRESSION: 1. Hyperdense distal right ICA and proximal MCA. 2. Blunted appearance of the posterior right putamen. Chronic right high frontal cortex infarcts. ASPECTS is 9 when excluding the chronic changes. Electronically Signed   By: Monte Fantasia M.D.   On: 06/29/2020 09:25   VAS  US CAROTID  Result Date: 06/30/2020 Carotid Arterial Duplex Study Indications:   CVA and Right stent. Other Factors: Rt ica stent- 06/29/20. Performing Technologist: [name] RVS  Examination Guidelines: A complete evaluation includes B-mode imaging, spectral Doppler, color Doppler, and power Doppler as needed of all accessible portions of each vessel. Bilateral testing is considered an integral part of a complete examination. Limited examinations for reoccurring indications may be performed as noted.  Right Carotid Findings: +----------+--------+--------+--------+------------------+--------+           PSV cm/sEDV cm/sStenosisPlaque DescriptionComments +----------+--------+--------+--------+------------------+--------+ CCA Prox  101                     heterogenous               +----------+--------+--------+--------+------------------+--------+ CCA Distal57  heterogenous               +----------+--------+--------+--------+------------------+--------+ ECA       103     6                                          +----------+--------+--------+--------+------------------+--------+ +----------+--------+-------+--------+-------------------+           PSV cm/sEDV cmsDescribeArm Pressure (mmHG) +----------+--------+-------+--------+-------------------+ Subclavian160                                        +----------+--------+-------+--------+-------------------+ +---------+--------+--+--------+--+---------+ VertebralPSV cm/s63EDV cm/s12Antegrade +---------+--------+--+--------+--+---------+  Right Stent(s): +---------------+--+-+--------+++ Prox to Stent  274         +---------------+--+-+--------+++ Proximal Stent 49          +---------------+--+-+--------+++ Mid Stent         occluded +---------------+--+-+--------+++ Distal Stent      occluded +---------------+--+-+--------+++ Distal to Stent   occluded  +---------------+--+-+--------+++   Left Carotid Findings: +----------+--------+--------+--------+------------------+--------+           PSV cm/sEDV cm/sStenosisPlaque DescriptionComments +----------+--------+--------+--------+------------------+--------+ CCA Prox  133     18              heterogenous               +----------+--------+--------+--------+------------------+--------+ CCA Distal95      17              heterogenous               +----------+--------+--------+--------+------------------+--------+ ICA Prox  97      22      1-39%   heterogenous               +----------+--------+--------+--------+------------------+--------+ ICA Distal108     36                                         +----------+--------+--------+--------+------------------+--------+ ECA       120     10                                         +----------+--------+--------+--------+------------------+--------+ +----------+--------+--------+--------+-------------------+           PSV cm/sEDV cm/sDescribeArm Pressure (mmHG) +----------+--------+--------+--------+-------------------+ [id number]                                         +----------+--------+--------+--------+-------------------+ +---------+--------+--+--------+--+---------+ VertebralPSV cm/s48EDV cm/s13Antegrade +---------+--------+--+--------+--+---------+   Summary: Right Carotid: ICA stent appears occluded. Left Carotid: Velocities in the left ICA are consistent with a 1-39% stenosis. Vertebrals: Bilateral vertebral arteries demonstrate antegrade flow. *See table(s) above for measurements and observations.     Preliminary    VAS [place] LOWER EXTREMITY VENOUS (DVT)  Result Date: 06/30/2020  Lower Venous DVTStudy Indications: Stroke.  Comparison Study: no prior Performing Technologist: [name] RVS  Examination Guidelines: A complete evaluation includes B-mode imaging, spectral Doppler, color Doppler, and  power Doppler as needed of all accessible portions of each vessel. Bilateral testing is considered an integral part of a complete examination. Limited examinations for  reoccurring indications may be performed as noted. The reflux portion of the exam is performed with the patient in reverse Trendelenburg.  +---------+---------------+---------+-----------+----------+--------------+ RIGHT    CompressibilityPhasicitySpontaneityPropertiesThrombus Aging +---------+---------------+---------+-----------+----------+--------------+ CFV      Full           Yes      Yes                                 +---------+---------------+---------+-----------+----------+--------------+ SFJ      Full                                                        +---------+---------------+---------+-----------+----------+--------------+ FV Prox  Full                                                        +---------+---------------+---------+-----------+----------+--------------+ FV Mid   Full                                                        +---------+---------------+---------+-----------+----------+--------------+ FV DistalFull                                                        +---------+---------------+---------+-----------+----------+--------------+ PFV      Full                                                        +---------+---------------+---------+-----------+----------+--------------+ POP      Full           Yes      Yes                                 +---------+---------------+---------+-----------+----------+--------------+ PTV      Full                                                        +---------+---------------+---------+-----------+----------+--------------+ PERO     Full                                                        +---------+---------------+---------+-----------+----------+--------------+    +---------+---------------+---------+-----------+----------+--------------+ LEFT     CompressibilityPhasicitySpontaneityPropertiesThrombus Aging +---------+---------------+---------+-----------+----------+--------------+ CFV      Full  Yes      Yes                                 +---------+---------------+---------+-----------+----------+--------------+ SFJ      Full                                                        +---------+---------------+---------+-----------+----------+--------------+ FV Prox  Full                                                        +---------+---------------+---------+-----------+----------+--------------+ FV Mid   Full                                                        +---------+---------------+---------+-----------+----------+--------------+ FV DistalFull                                                        +---------+---------------+---------+-----------+----------+--------------+ PFV      Full                                                        +---------+---------------+---------+-----------+----------+--------------+ POP      Full           Yes      Yes                                 +---------+---------------+---------+-----------+----------+--------------+ PTV      Full                                                        +---------+---------------+---------+-----------+----------+--------------+     Summary: BILATERAL: - No evidence of deep vein thrombosis seen in the lower extremities, bilaterally. - No evidence of superficial venous thrombosis in the lower extremities, bilaterally. -   *See table(s) above for measurements and observations.    Preliminary       Assessment/Plan: Diagnosis: R MCA infarct 1. Does the need for close, 24 hr/day medical supervision in concert with the patient's rehab needs make it unreasonable for this patient to be served in a less intensive setting?  Yes 2. Co-Morbidities requiring supervision/potential complications: carotid occlusion, left sided severe weakness, hypotension, bradycardia, secretions, cough, HLD, dysphagia 3. Due to bladder management, bowel management, safety, skin/wound care, disease management, medication administration, pain management and patient education, does the patient require 24 hr/day rehab nursing? Yes 4.  Does the patient require coordinated care of a physician, rehab nurse, therapy disciplines of PT, OT, SLP to address physical and functional deficits in the context of the above medical diagnosis(es)? Yes Addressing deficits in the following areas: balance, endurance, locomotion, strength, transferring, bowel/bladder control, bathing, dressing, feeding, grooming, toileting, speech, swallowing and psychosocial support 5. Can the patient actively participate in an intensive therapy program of at least 3 hrs of therapy per day at least 5 days per week? Yes 6. The potential for patient to make measurable gains while on inpatient rehab is excellent 7. Anticipated functional outcomes upon discharge from inpatient rehab are min assist  with PT, min assist with OT, min assist with SLP. 8. Estimated rehab length of stay to reach the above functional goals is: 2-3 weeks 9. Anticipated discharge destination: Home 10. Overall Rehab/Functional Prognosis: excellent  RECOMMENDATIONS: This patient's condition is appropriate for continued rehabilitative care in the following setting: CIR Patient has agreed to participate in recommended program. Yes Note that insurance prior authorization may be required for reimbursement for recommended care.  Comment: Thank you for this consult. Admission coordinator to follow.   I have personally performed a face to face diagnostic evaluation, including, but not limited to relevant history and physical exam findings, of this patient and developed relevant assessment and plan.  Additionally,  I have reviewed and concur with the physician assistant's documentation above.  Leeroy Cha, MD  Cathlyn Parsons, PA-C 06/30/2020        Revision History                     Routing History           Note Details  Author Izora Ribas, MD File Time 07/01/2020 10:34 AM  Author Type Physician Status Signed  Last Editor Izora Ribas, MD Service Physical Medicine [facility] [place] # [id number] Admit Date 07/09/2020

## 2020-07-09 NOTE — Progress Notes (Signed)
Inpatient Rehabilitation Medication Review by a Pharmacist  A complete drug regimen review was completed for this patient to identify any potential clinically significant medication issues.  Clinically significant medication issues were identified:  no  Check AMION for pharmacist assigned to patient if future medication questions/issues arise during this admission.  Pharmacist comments:   Time spent performing this drug regimen review (minutes):  5   Hanson Medeiros 07/09/2020 4:58 PM

## 2020-07-09 NOTE — Progress Notes (Addendum)
Calorie Count Note  48 hour calorie count ordered.  Diet: Dysphagia 1 with Thin liquids Supplements: Ensure Enlive TID, Magic Cup TID  Day 1 Results:  Breakfast: 875 kcals, 37 grams protein Lunch: 976 kcals, 42 grams protein Dinner: 871 kcals, 33 grams protein Supplements: Pt only provided with 1 yesterday and consumed 100%, 350 kcals and 20 grams protein  Total intake: 3072 kcal (100% of minimum estimated needs)  112 protein (100% of minimum estimated needs)  Nutrition Dx: Inadequate oral intake related to decreased appetite, acute illness as evidenced by per patient/family report  Goal: Patient will meet greater than or equal to 90% of their needs  Intervention: Continue Ensure Enlive TID and Magic Cup TID. Follow-up with Day 2 results tomorrow.   Tyler Ina, MS, RD, LDN RD pager number and weekend/on-call pager number located in [place].

## 2020-07-09 NOTE — Discharge Summary (Signed)
Physician Discharge Summary  HAMID BROOKENS PXT:[id number] DOB: 1951/05/21 DOA: 06/29/2020  PCP: Patient, No Pcp Per  Admit date: 06/29/2020 Discharge date: 07/09/2020  Admitted From: Home Disposition: [facility]  Recommendations for Outpatient Follow-up:  1. Follow up with PCP in 1-2 weeks 2. Follow-up with neurology within 3 to 4 weeks after discharge from CIR 3. Follow with GI within 1 to 2 weeks after discharge from CIR 4. Please obtain BMP/CBC in one week 5. Please follow up with your PCP on the following pending results: Unresulted Labs (From admission, onward)          Start     Ordered   07/09/20 0500  CBC with Differential/Platelet  Daily,   R     Question:  Specimen collection method  Answer:  Lab=Lab collect   07/08/20 1449   07/09/20 6270  Basic metabolic panel  Daily,   R     Question:  Specimen collection method  Answer:  Lab=Lab collect   07/08/20 1449   07/08/20 1020  Magnesium  (ICU Tube Feeding: PEPuP )  5A & 5P,   R (with TIMED occurrences)     Question:  Specimen collection method  Answer:  Lab=Lab collect   07/08/20 1018   07/08/20 1020  Phosphorus  (ICU Tube Feeding: PEPuP )  5A & 5P,   R (with TIMED occurrences)     Question:  Specimen collection method  Answer:  Lab=Lab collect   07/08/20 [place]: None Equipment/Devices: None  Discharge Condition: Stable CODE STATUS: Full code Diet recommendation: Cardiac  Subjective: Seen and examined.  Wife at the bedside.  He has no complaints.  Mild dysarthria.  Brief/Interim Summary: The patient is a 69 year old Caucasian male who presented on 06/29/2020 with left-sided weakness.  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  Initially the stent was treated with dual antiplatelet therapy and a to a/3B inhibitor but subsequently developed an intracranial hemorrhage and had postprocedural difficulties with intermittent bradycardia. Echo showed normal LV systolic function with grade 1 diastolic dysfunction.  repeat head imaging showed decreasing right basal ganglia hemorrhage and evolution of right frontal insular infarcts. Carotid Doppler repeated and showED that the right ICA stent reoccluded.  Lower extremity venous duplex showed no DVT.  Subsequently he also had a small episode of red blood per rectum overnight from 10/7-10/8, GI was consulted however due to bradycardia, GI deferred any endoscopic procedure as he was not deemed stable from anesthesia perspective.  On 07/02/2020, patient had a witnessed 11-second pause that resulted in syncopal episode.  Cardiology was consulted.  He was started on Florinef and midodrine.  He was initially started on 10 mg iodine 3 times daily which was weaned down to 5 mg 3 times daily.  Per cardiology, he had excessive vagal response causing bradycardia.  Fortunately his hemoglobin remained stable.  His main issue that was holding his discharge was recurrent bradycardia but since last 48 hours, patient's bradycardia has remained under control, he has remained asymptomatic and he has been working with physical therapy much better.  Per PT recommendations, CIR evaluated him and he was deemed a good candidate.  He has been cleared from neurology.  We spoke to GI  once again about his instability.  I had a long discussion with PA of GI yesterday who said that in the face of stable hemoglobin, patient does not need any scopes however she recommended follow-up with GI as outpatient or calling GI inpatient if he were to have any GI bleeding issues during his stay at CIR.  Patient is being discharged to CIR in stable condition.  He is going to be discharged only on aspirin as antiplatelet agent for reasons  mentioned above.  He will also be on atorvastatin.  Discharge Diagnoses:  Active Problems:   Arterial ischemic stroke, MCA, right, acute (HCC)   Stroke (cerebrum) (HCC)   Middle cerebral artery embolism, right   Lower GI bleed   Adverse reaction to antiplatelet agent   Bradycardia    Discharge Instructions   Allergies as of 07/09/2020   No Known Allergies     Medication List    TAKE these medications   acetaminophen 325 MG tablet Commonly known as: TYLENOL Take 650 mg by mouth every 6 (six) hours as needed for mild pain or headache.   aspirin EC 81 MG tablet Take 81 mg by mouth daily as needed for mild pain. Swallow whole.   atorvastatin 40 MG tablet Commonly known as: LIPITOR Take 1 tablet (40 mg total) by mouth daily.   fludrocortisone 0.1 MG tablet Commonly known as: FLORINEF Take 1 tablet (0.1 mg total) by mouth daily.   midodrine 5 MG tablet Commonly known as: PROAMATINE Take 1 tablet (5 mg total) by mouth every 8 (eight) hours.   pantoprazole 40 MG tablet Commonly known as: PROTONIX Take 1 tablet (40 mg total) by mouth daily.   sulfamethoxazole-trimethoprim 800-160 MG tablet Commonly known as: BACTRIM DS Take 1 tablet by mouth every 12 (twelve) hours for 3 days.       Follow-up Information    Luanne Bras, MD Follow up in 3 month(s).   Specialties: Interventional Radiology, Radiology Why: Please plan to follow-up with carotid US followed by consult with Dr. Estanislado Pandy 3 months after discharge. Our office will call you to set up this appointment. Contact information: [address] [place] [address] [phone]              No Known Allergies  Consultations: Neurology, cardiology, GI, IR   Procedures/Studies: CT Code Stroke CTA Head W/WO contrast  Result Date: 06/29/2020 CLINICAL DATA:  Hyperdense vessel and stroke-like symptoms EXAM: CT ANGIOGRAPHY HEAD AND NECK CT PERFUSION BRAIN TECHNIQUE: Multidetector CT imaging of the  head and neck was performed using the standard protocol during bolus administration of intravenous contrast. Multiplanar CT image reconstructions and MIPs were obtained to evaluate the vascular anatomy. Carotid stenosis measurements (when applicable) are obtained utilizing NASCET criteria, using the distal internal carotid diameter as the denominator. Multiphase CT imaging of the brain was performed following IV bolus contrast injection. Subsequent parametric perfusion maps were calculated using RAPID software. CONTRAST:  Dose is not yet known COMPARISON:  None. FINDINGS: CTA NECK FINDINGS Aortic arch: Mild atheromatous changes. Three vessel branching. No acute finding. Right carotid system: Atherosclerotic plaque about the bifurcation with occluded ICA bulb. No flow seen within the ICA in the neck. Left carotid system: Atheromatous plaque at the bifurcation and bulb. No stenosis or ulceration. Negative for beading. Vertebral arteries: Low-density plaque at the left subclavian origin. No flow limiting subclavian stenosis. Plaque at the left vertebral origin without stenosis, beading, or dissection. Skeleton: Focal advanced C5-6 disc degeneration with ridging causing biforaminal  impingement. Ordinary facet osteoarthritis. Other neck: No acute or aggressive finding. Right-sided chronic sinusitis with pattern middle meatus obstruction. Upper chest: Granulomatous nodal calcifications. There is also a calcified granuloma appearance in the left upper lobe. Review of the MIP images confirms the above findings CTA HEAD FINDINGS Anterior circulation: No flow seen in the right carotid or MCA branches. There is distal M2 M3 branch reconstitution. Calcified plaque along the left carotid siphon. No MCA branch occlusion, aneurysm, or beading. Posterior circulation: The vertebral and basilar arteries are smooth and widely patent. High-grade atheromatous narrowings of the bilateral PCA, P3 segment on the right and upper first order  branch on the left. Venous sinuses: Patent as permitted by contrast timing Anatomic variants: None significant Review of the MIP images confirms the above findings CT Brain Perfusion Findings: ASPECTS: 9 CBF (<30%) Volume: 53mL Perfusion (Tmax>6.0s) volume: 115mL Mismatch Volume: 162mL Infarction Location:Right basal ganglia and lesser extensive lateral frontal cortex infarct. Critical Value/emergent results were called by telephone at the time of interpretation on 06/29/2020 at 9:37 am to provider ERIC [facility] , who verbally acknowledged these results. IMPRESSION: 1. Emergent large vessel occlusion with no flow seen in the right internal carotid or proximal MCA. There is a 36 cc core infarct and 142 cc of penumbra by CT perfusion. 2. Atherosclerosis without flow limiting stenosis of the other major vessels. 3. Bilateral high-grade atheromatous PCA narrowings, more proximal on the right. 4. 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 Electronically Signed   By: Monte Fantasia M.D.   On: 06/29/2020 09:53   CT HEAD WO CONTRAST  Result Date: 07/01/2020 CLINICAL DATA:  Follow-up intracranial hemorrhage EXAM: CT HEAD WITHOUT CONTRAST TECHNIQUE: Contiguous axial images were obtained from the base of the skull through the vertex without intravenous contrast. COMPARISON:  CT from 2 days ago FINDINGS: Brain: More contracted and homogeneous hematoma in the right basal ganglia measuring up to 2.8 x 2 cm. Cytotoxic edema from infarct has become apparent in the right basal ganglia and adjacent frontal insular cortex. No interval hemorrhage, hydrocephalus, or midline shift. Vascular: The right ICA is hyperdense below the skull base, unchanged. Skull: No acute or aggressive finding Sinuses/Orbits: Chronic right maxillary, ethmoid, and frontal sinusitis. Generalized mucosal thickening seen elsewhere, progressed. IMPRESSION: 1. Interval contraction of the right basal ganglia hematoma. 2. Expected  evolution of the right frontoinsular infarcts. 3. 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 4. No new abnormality. Electronically Signed   By: Monte Fantasia M.D.   On: 07/01/2020 07:16   CT HEAD WO CONTRAST  Result Date: 06/29/2020 CLINICAL DATA:  Follow-up stroke and subsequent intervention. EXAM: CT HEAD WITHOUT CONTRAST TECHNIQUE: Contiguous axial images were obtained from the base of the skull through the vertex without intravenous contrast. COMPARISON:  Earlier same day FINDINGS: Brain: 3.9 x 3.0 x 3.0 cm hyperdense region in the right posterior basal ganglia most consistent with a parenchymal hematoma, given small internal fluid levels. Mild mass effect and surrounding edema. No midline shift. Chronic ischemic changes in the right posterior frontal region as seen previously. Vascular: No other new vascular finding. Skull: Negative Sinuses/Orbits: Opacified right maxillary and ethmoid sinuses. Orbits negative. Other: None IMPRESSION: 1. 3.9 x 3.0 x 3.0 cm hyperdense region in the right posterior basal ganglia most consistent with parenchymal hematoma, given small internal fluid levels. Mild mass effect and surrounding edema. No midline shift. 2. Chronic ischemic changes in the right posterior frontal region as seen previously. 3. Opacified right  maxillary and ethmoid sinuses. Electronically Signed   By: Nelson Chimes M.D.   On: 06/29/2020 18:48   CT Code Stroke CTA Neck W/WO contrast  Result Date: 06/29/2020 CLINICAL DATA:  Hyperdense vessel and stroke-like symptoms EXAM: CT ANGIOGRAPHY HEAD AND NECK CT PERFUSION BRAIN TECHNIQUE: Multidetector CT imaging of the head and neck was performed using the standard protocol during bolus administration of intravenous contrast. Multiplanar CT image reconstructions and MIPs were obtained to evaluate the vascular anatomy. Carotid stenosis measurements (when applicable) are obtained utilizing NASCET criteria, using the distal internal carotid diameter  as the denominator. Multiphase CT imaging of the brain was performed following IV bolus contrast injection. Subsequent parametric perfusion maps were calculated using RAPID software. CONTRAST:  Dose is not yet known COMPARISON:  None. FINDINGS: CTA NECK FINDINGS Aortic arch: Mild atheromatous changes. Three vessel branching. No acute finding. Right carotid system: Atherosclerotic plaque about the bifurcation with occluded ICA bulb. No flow seen within the ICA in the neck. Left carotid system: Atheromatous plaque at the bifurcation and bulb. No stenosis or ulceration. Negative for beading. Vertebral arteries: Low-density plaque at the left subclavian origin. No flow limiting subclavian stenosis. Plaque at the left vertebral origin without stenosis, beading, or dissection. Skeleton: Focal advanced C5-6 disc degeneration with ridging causing biforaminal impingement. Ordinary facet osteoarthritis. Other neck: No acute or aggressive finding. Right-sided chronic sinusitis with pattern middle meatus obstruction. Upper chest: Granulomatous nodal calcifications. There is also a calcified granuloma appearance in the left upper lobe. Review of the MIP images confirms the above findings CTA HEAD FINDINGS Anterior circulation: No flow seen in the right carotid or MCA branches. There is distal M2 M3 branch reconstitution. Calcified plaque along the left carotid siphon. No MCA branch occlusion, aneurysm, or beading. Posterior circulation: The vertebral and basilar arteries are smooth and widely patent. High-grade atheromatous narrowings of the bilateral PCA, P3 segment on the right and upper first order branch on the left. Venous sinuses: Patent as permitted by contrast timing Anatomic variants: None significant Review of the MIP images confirms the above findings CT Brain Perfusion Findings: ASPECTS: 9 CBF (<30%) Volume: 40mL Perfusion (Tmax>6.0s) volume: 133mL Mismatch Volume: 176mL Infarction Location:Right basal ganglia and  lesser extensive lateral frontal cortex infarct. Critical Value/emergent results were called by telephone at the time of interpretation on 06/29/2020 at 9:37 am to provider ERIC [facility] , who verbally acknowledged these results. IMPRESSION: 1. Emergent large vessel occlusion with no flow seen in the right internal carotid or proximal MCA. There is a 36 cc core infarct and 142 cc of penumbra by CT perfusion. 2. Atherosclerosis without flow limiting stenosis of the other major vessels. 3. Bilateral high-grade atheromatous PCA narrowings, more proximal on the right. 4. 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 Electronically Signed   By: Monte Fantasia M.D.   On: 06/29/2020 09:53   MR BRAIN WO CONTRAST  Result Date: 06/30/2020 CLINICAL DATA:  Stroke follow-up EXAM: MRI HEAD WITHOUT CONTRAST TECHNIQUE: Multiplanar, multiecho pulse sequences of the brain and surrounding structures were obtained without intravenous contrast. COMPARISON:  Head CT and CTA from yesterday FINDINGS: Brain: Acute infarction at the right stratum and patchy along the insula and lateral frontal cortex. The area of infarction appears similar to the area of pre treatment core infarct. Hematoma centered at the right putamen with dimensions better assessed by prior CT, grossly similar at to 3.5 cm anterior to posterior. Correlating to fluid fluid level, there is a posterior dense clot appearance and  more heterogeneous anterior component. On gradient imaging petechial hemorrhage is seen along the right MCA watershed. No hydrocephalus or shift. Remote high right frontal cortex infarct anteriorly. Vascular: No flow voids seen in the left ICA beginning in the upper cervical spine. There is normalization by the supraclinoid segment. Skull and upper cervical spine: No focal marrow lesion Sinuses/Orbits: Right middle meatus obstruction with frontal, ethmoid, and especially maxillary sinusitis on the right. IMPRESSION: 1.  Acute infarct at the right basal ganglia with nonprogressive hemorrhage when correlated with prior CT. Less extensive patchy acute cortical infarct in the right MCA territory. The areas of acute infarction correlate well with pretreatment CTP. 2. 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 Electronically Signed   By: Monte Fantasia M.D.   On: 06/30/2020 04:22   IR INTRAVSC STENT CERV CAROTID W/O EMB-PROT MOD SED  Result Date: 07/02/2020 INDICATION: New onset right gaze deviation, left hemiplegia, and left-sided neglect. EXAM: 1. EMERGENT LARGE VESSEL OCCLUSION THROMBOLYSIS anterior CIRCULATION) COMPARISON:  CT angiogram of the head and neck of June 29, 2020. MEDICATIONS: Ancef 2 g IV was administered within 1 hour of the procedure. ANESTHESIA/SEDATION: General anesthesia CONTRAST:  Isovue 300 approximately 165 mL FLUOROSCOPY TIME:  Fluoroscopy Time: 97 minutes 0 seconds (3622 mGy). COMPLICATIONS: None immediate. TECHNIQUE: Following a full explanation of the procedure along with the potential associated complications, an informed witnessed consent was obtained from the spouse. The risks of intracranial hemorrhage of 10%, worsening neurological deficit, ventilator dependency, death and inability to revascularize were all reviewed in detail with the patient's spouse. The patient was then put under general anesthesia by the Department of Anesthesiology at [facility]. The right groin was prepped and draped in the usual sterile fashion. Thereafter using modified Seldinger technique, transfemoral access into the right common femoral artery was obtained without difficulty. Over a 0.035 inch guidewire an 8 French 25 Pinnacle sheath was inserted. Through this, and also over a 0.035 inch guidewire a 5 French 125 cm Berenstein select inside of the balloon guide catheter combinationwas advanced to the aortic arch region and selectively positioned in the distal right  common carotid artery. Guidewire, and the select catheter removed. Good aspiration obtained from the hub of the balloon guide catheter in the right common carotid artery. Arteriogram was then performed centered extra cranially and intracranially. FINDINGS: The right common carotid arteriogram demonstrates the right external carotid artery and its major branches to be widely patent. The right internal carotid artery at the bulb demonstrates a moderate sized plaque associated with complete occlusion just distal to the bulb. No evidence of distal angiographic string sign, or reconstitution of right internal carotid artery in the cavernous segment was noted. PROCEDURE: Through the balloon guide catheter, a combination of an 021 Trevo ProVue microcatheter over a 0.014 inch standard Synchro micro guidewire with a J configuration, access through the occluded right internal carotid artery was obtained with the micro guidewire leading followed by the microcatheter. The combination was advanced to the horizontal petrous segment where the micro guidewire was removed. Poor aspiration was obtained from the microcatheter hub on account of the extensive clot in the entire right internal carotid artery intracranially and extra cranially. The microcatheter was then exchanged for a 014 inch Synchro standard 300 cm exchange micro guidewire with a J-tip configuration. A control arteriogram performed through the balloon guide demonstrated minimally improved caliber through the proximal right internal carotid artery. A 4 mm x 30 mm Viatrac 14  angioplasty balloon catheter which had been prepped with 50% contrast and 50% heparinized saline infusion and integrated with 75% contrast and 25% heparinized saline infusion was advanced over the exchange micro guidewire using the rapid exchange technique. The proximal and distal markers were then positioned in the described landing zones. A control inflation was then performed using micro  inflation syringe device via micro tubing to 12 atmospheres. This was maintained for about 20 seconds and retrieved proximally. A control arteriogram performed through the balloon guide demonstrated improved caliber in the bulb but there continued be a high-grade stenosis in the proximal right internal carotid artery. This prompted a second angioplasty in the manner as described above again to 12 atmospheres for approximately 20 seconds. The balloon was then deflated and retrieved and removed. A control arteriogram performed through the balloon guide demonstrated significantly improved caliber and flow through the angioplastied segment with improved flow to the more distal right internal carotid artery. However, there continued to be extensive clot noted within the internal carotid artery at the cranial skull base and the supraclinoid right ICA. An 021 Trevo ProVue catheter inside of a 071 134 cm aspiration catheter, combination was advanced over the exchange micro guidewire to the cavernous segment of the right internal carotid artery. Exchange micro guidewire was replaced with a regular 014 inch standard Synchro micro guidewire with a J configuration. This was then advanced using a torque device through the occluded right middle cerebral artery into one of the inferior division branches in the M2 M3 region followed by the microcatheter. The guidewire was removed. Good aspiration obtained from the hub of the microcatheter. A gentle control arteriogram performed through this demonstrated slow flow through the vessel itself. A 4 mm x 40 mm Solitaire X retrieval device was then advanced to the distal end of the microcatheter. The retriever was deployed in the usual manner. The Zoom aspiration catheter was advanced to the mid right M1 segment. Continuous aspiration was then performed at the hub of the aspiration catheter for 2-1/2 minutes, with proximal flow arrest in the right internal carotid artery. The retrieval  device, the microcatheter and the aspiration catheter were retrieved and removed. Copious amounts of clot were captured into the [place], and also in the retrieval device. Following reversal of flow arrest, a control arteriogram performed through the balloon guide catheter demonstrated now improved flow through the proximal right internal carotid artery extra cranially and intracranially. There is now patency of the right anterior cerebral artery with a stump of the occluded right middle cerebral artery. A second pass was then made again using the above combination. The microcatheter was now accessed into the superior division of the right middle cerebral artery M2 M3 region over a 0.014 inch standard Synchro micro guidewire. Free aspiration of a blood was obtained at the hub of the microcatheter. A Tiger 21 retrieval device was then advanced to the distal end of the retrieval device. This was then deployed by retrieving the microcatheter. The proximal portion of the retrieval device was within the proximal aspect of the occluded right middle cerebral artery. Free aspiration was then started through the aspiration catheter with the Penumbra aspiration device which was positioned in the distal portion of the occluded clot. The retrieval device was then opened and closed until there was kinking at the proximal portion. The microcatheter was then advanced to the proximal marker on the retrieval device. With constant aspiration continued, the Tiger 21 retrieval device was then removed as constant aspiration was  applied at the hub of the aspiration catheter, and also with proximal flow arrest and aspiration at the hub of the balloon guide with a 60 mL syringe. Multiple fragments of clot were obtained within the [place], and also entangled in the retrieval device. With reversal of the proximal flow arrest, a control arteriogram performed through the balloon guide in the right internal carotid artery now  demonstrated improved opacification of the right middle cerebral artery to where there was now opacification of the anterior temporal branch. Distal branch remains occluded with no change in anterior cerebral artery. A third pass was now made using an 027 150 cm Marksman microcatheter which was advanced again using the combination of the 071 Zoom aspiration catheter over a 0.014 inch standard Synchro micro guidewire. This combination was advanced into the now the inferior division with the microcatheter advanced into the M2 region of the inferior division. The micro guidewire was removed. Good aspiration obtained from the hub of the 027 microcatheter. A gentle control arteriogram performed through this now demonstrated free flow into the distal vasculature. The aspiration catheter was engaged into the occluded distal right middle cerebral artery at the origin of the inferior division. Aspiration was now at the hub of the aspiration catheter embedded in the right middle cerebral artery clot and also at the hub of the Marksman catheter for approximately 2 minutes, with proximal flow arrest in the right internal carotid artery and constant aspiration at its hub. The Marksman catheter was then gently retrieved while maintaining aspiration through the Zoom aspiration catheter. The Zoom aspiration catheter remained loft to aspiration. This was then retrieved and removed. Again clots were seen in the aspirate. Control arteriogram performed following reversal of flow arrest in the right internal carotid artery now demonstrated complete revascularization of the right MCA distribution, into the distal distribution achieving a TICI 2c revascularization. The right anterior cerebral artery remained widely patent. Measurements were now performed of the right internal carotid artery proximally, and the right common carotid artery distally at the proposed landing zones for the revascularization stent. An 021 Trevo ProVue  microcatheter was advanced to the petrous segment of the right internal carotid artery over a 0.014 inch standard Synchro micro guidewire, and replaced with an 014 inch 300 cm Synchro exchange micro guidewire. Safe position of the tip of the exchange micro guidewire was verified. It was elected to proceed with placement of a 6/8 mm x 40 mm Xact stent across the diseased previously occluded right internal carotid artery proximally. This was retrogradely purged with heparinized saline infusion. Using the rapid exchange technique, the delivery system of the stent was then advanced and positioned covering distally, and also the proximal portion of the landing zones. Stent was deployed in the usual manner without any difficulty. The delivery system was removed. Good aspiration obtained from the hub of the balloon guide in the right common carotid artery. A control arteriogram performed through this demonstrated excellent positioning and apposition of the stent with now free flow noted into the intracranial right ICA and the right middle and right anterior cerebral arteries being widely patent. Prior to this patient was loaded with 81 mg aspirin, and 180 mg of Brilinta via an orogastric tube. CT scan of the brain was then performed which demonstrated contrast blush of the right basal ganglia, and also to the 3 focal areas of hyperdensity suspicious for micro hemorrhages. 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 Three quarter bolus dose of Cangrelor was then given intravenously in order to salvage the occluding stent in the right internal carotid artery. The balloon guide was then removed following removal of the exchange micro guidewire. The diagnostic catheter was then positioned in the left common carotid artery. This demonstrated  the left external carotid artery and the left internal carotid arteries to be widely patent. Patency of the left internal carotid to the cranial skull base was verified. Also the left middle and the left anterior cerebral arteries were seen to opacify into the capillary and venous phases. Also almost simultaneously cross-filling via the anterior communicating artery of the right anterior and the right middle cerebral arteries is noted via the anterior communicating artery. No gross filling defects were seen in the middle cerebral artery distribution. 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 The diagnostic catheter was removed. The 8 French Pinnacle sheath was then exchanged for a short 8 [place] sheath which in turn was then removed with the successful application of the 7 [place] ExoSeal closure device with hemostasis in the right groin. Distal pulses remained palpable in the feet bilaterally unchanged. A second CT scan of the brain demonstrated no significant change in the right basal ganglia with continued presence of at least 3 foci of hemorrhage. No mass effect or midline shift was seen. Patient was left intubated on account of patient's medical condition and inability to respond appropriately to protect the airway. Patient was then transferred to the neuro ICU for post recanalization management. IMPRESSION: Status post endovascular complete revascularization of the right middle cerebral artery and the right anterior cerebral artery with 1 pass with a Solitaire 4 mm x 40 mm X retrieval device and aspiration, and 1 pass with the Tiger 21 retrieval device with proximal aspiration, and with 1 pass with contact aspiration achieving a TICI 2c revascularization of the right middle cerebral artery distribution. 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 Attempt to rescue with 3/4 bolus dose of Cangrelor IV. PLAN: Follow-up in the clinic 4 to 6 weeks post discharge. Electronically Signed   By: Luanne Bras M.D.   On: 06/30/2020 13:20   IR CT [facility]  Result Date: 07/02/2020 INDICATION: New onset right gaze deviation, left hemiplegia, and left-sided neglect. EXAM: 1. EMERGENT LARGE VESSEL OCCLUSION THROMBOLYSIS anterior CIRCULATION) COMPARISON:  CT angiogram of the head and neck of June 29, 2020. MEDICATIONS: Ancef 2 g IV was administered within 1 hour of the procedure. ANESTHESIA/SEDATION: General anesthesia CONTRAST:  Isovue 300 approximately 165 mL FLUOROSCOPY TIME:  Fluoroscopy Time: 97 minutes 0 seconds (3622 mGy). COMPLICATIONS: None immediate. TECHNIQUE: Following a full explanation of the procedure along with the potential associated complications, an informed witnessed consent was obtained from the spouse. The risks of intracranial hemorrhage of 10%, worsening neurological deficit, ventilator dependency, death and inability to revascularize were all reviewed in detail with the patient's spouse. The patient was then put under general anesthesia by the Department of Anesthesiology at [facility]. The right groin was prepped and draped in the usual sterile fashion. Thereafter using modified Seldinger technique, transfemoral access into the right common femoral artery was obtained without difficulty. Over a 0.035 inch guidewire an 8 [place]  25 Pinnacle sheath was inserted. Through this, and also over a 0.035 inch guidewire a 5 French 125 cm Berenstein select inside of the balloon guide catheter combinationwas advanced to the aortic arch region and selectively positioned in the distal right common carotid artery. Guidewire, and the select catheter removed. Good aspiration obtained from the hub of the balloon guide  catheter in the right common carotid artery. Arteriogram was then performed centered extra cranially and intracranially. FINDINGS: The right common carotid arteriogram demonstrates the right external carotid artery and its major branches to be widely patent. The right internal carotid artery at the bulb demonstrates a moderate sized plaque associated with complete occlusion just distal to the bulb. No evidence of distal angiographic string sign, or reconstitution of right internal carotid artery in the cavernous segment was noted. PROCEDURE: Through the balloon guide catheter, a combination of an 021 Trevo ProVue microcatheter over a 0.014 inch standard Synchro micro guidewire with a J configuration, access through the occluded right internal carotid artery was obtained with the micro guidewire leading followed by the microcatheter. The combination was advanced to the horizontal petrous segment where the micro guidewire was removed. Poor aspiration was obtained from the microcatheter hub on account of the extensive clot in the entire right internal carotid artery intracranially and extra cranially. The microcatheter was then exchanged for a 014 inch Synchro standard 300 cm exchange micro guidewire with a J-tip configuration. A control arteriogram performed through the balloon guide demonstrated minimally improved caliber through the proximal right internal carotid artery. A 4 mm x 30 mm Viatrac 14 angioplasty balloon catheter which had been prepped with 50% contrast and 50% heparinized saline infusion and integrated with 75% contrast and 25% heparinized saline infusion was advanced over the exchange micro guidewire using the rapid exchange technique. The proximal and distal markers were then positioned in the described landing zones. A control inflation was then performed using micro inflation syringe device via micro tubing to 12 atmospheres. This was maintained for about 20 seconds and retrieved proximally. A  control arteriogram performed through the balloon guide demonstrated improved caliber in the bulb but there continued be a high-grade stenosis in the proximal right internal carotid artery. This prompted a second angioplasty in the manner as described above again to 12 atmospheres for approximately 20 seconds. The balloon was then deflated and retrieved and removed. A control arteriogram performed through the balloon guide demonstrated significantly improved caliber and flow through the angioplastied segment with improved flow to the more distal right internal carotid artery. However, there continued to be extensive clot noted within the internal carotid artery at the cranial skull base and the supraclinoid right ICA. An 021 Trevo ProVue catheter inside of a 071 134 cm aspiration catheter, combination was advanced over the exchange micro guidewire to the cavernous segment of the right internal carotid artery. Exchange micro guidewire was replaced with a regular 014 inch standard Synchro micro guidewire with a J configuration. This was then advanced using a torque device through the occluded right middle cerebral artery into one of the inferior division branches in the M2 M3 region followed by the microcatheter. The guidewire was removed. Good aspiration obtained from the hub of the microcatheter. A gentle control arteriogram performed through this demonstrated slow flow through the vessel itself. A 4 mm x 40 mm Solitaire X retrieval device was then advanced to the distal end of the microcatheter. The retriever was deployed in the usual manner. The Zoom aspiration catheter was advanced to the  mid right M1 segment. Continuous aspiration was then performed at the hub of the aspiration catheter for 2-1/2 minutes, with proximal flow arrest in the right internal carotid artery. The retrieval device, the microcatheter and the aspiration catheter were retrieved and removed. Copious amounts of clot were captured into the  [place], and also in the retrieval device. Following reversal of flow arrest, a control arteriogram performed through the balloon guide catheter demonstrated now improved flow through the proximal right internal carotid artery extra cranially and intracranially. There is now patency of the right anterior cerebral artery with a stump of the occluded right middle cerebral artery. A second pass was then made again using the above combination. The microcatheter was now accessed into the superior division of the right middle cerebral artery M2 M3 region over a 0.014 inch standard Synchro micro guidewire. Free aspiration of a blood was obtained at the hub of the microcatheter. A Tiger 21 retrieval device was then advanced to the distal end of the retrieval device. This was then deployed by retrieving the microcatheter. The proximal portion of the retrieval device was within the proximal aspect of the occluded right middle cerebral artery. Free aspiration was then started through the aspiration catheter with the Penumbra aspiration device which was positioned in the distal portion of the occluded clot. The retrieval device was then opened and closed until there was kinking at the proximal portion. The microcatheter was then advanced to the proximal marker on the retrieval device. With constant aspiration continued, the Tiger 21 retrieval device was then removed as constant aspiration was applied at the hub of the aspiration catheter, and also with proximal flow arrest and aspiration at the hub of the balloon guide with a 60 mL syringe. Multiple fragments of clot were obtained within the [place], and also entangled in the retrieval device. With reversal of the proximal flow arrest, a control arteriogram performed through the balloon guide in the right internal carotid artery now demonstrated improved opacification of the right middle cerebral artery to where there was now opacification of the anterior temporal  branch. Distal branch remains occluded with no change in anterior cerebral artery. A third pass was now made using an 027 150 cm Marksman microcatheter which was advanced again using the combination of the 071 Zoom aspiration catheter over a 0.014 inch standard Synchro micro guidewire. This combination was advanced into the now the inferior division with the microcatheter advanced into the M2 region of the inferior division. The micro guidewire was removed. Good aspiration obtained from the hub of the 027 microcatheter. A gentle control arteriogram performed through this now demonstrated free flow into the distal vasculature. The aspiration catheter was engaged into the occluded distal right middle cerebral artery at the origin of the inferior division. Aspiration was now at the hub of the aspiration catheter embedded in the right middle cerebral artery clot and also at the hub of the Marksman catheter for approximately 2 minutes, with proximal flow arrest in the right internal carotid artery and constant aspiration at its hub. The Marksman catheter was then gently retrieved while maintaining aspiration through the Zoom aspiration catheter. The Zoom aspiration catheter remained loft to aspiration. This was then retrieved and removed. Again clots were seen in the aspirate. Control arteriogram performed following reversal of flow arrest in the right internal carotid artery now demonstrated complete revascularization of the right MCA distribution, into the distal distribution achieving a TICI 2c revascularization. The right anterior cerebral artery remained widely patent. Measurements  were now performed of the right internal carotid artery proximally, and the right common carotid artery distally at the proposed landing zones for the revascularization stent. An 021 Trevo ProVue microcatheter was advanced to the petrous segment of the right internal carotid artery over a 0.014 inch standard Synchro micro guidewire, and  replaced with an 014 inch 300 cm Synchro exchange micro guidewire. Safe position of the tip of the exchange micro guidewire was verified. It was elected to proceed with placement of a 6/8 mm x 40 mm Xact stent across the diseased previously occluded right internal carotid artery proximally. This was retrogradely purged with heparinized saline infusion. Using the rapid exchange technique, the delivery system of the stent was then advanced and positioned covering distally, and also the proximal portion of the landing zones. Stent was deployed in the usual manner without any difficulty. The delivery system was removed. Good aspiration obtained from the hub of the balloon guide in the right common carotid artery. A control arteriogram performed through this demonstrated excellent positioning and apposition of the stent with now free flow noted into the intracranial right ICA and the right middle and right anterior cerebral arteries being widely patent. Prior to this patient was loaded with 81 mg aspirin, and 180 mg of Brilinta via an orogastric tube. CT scan of the brain was then performed which demonstrated contrast blush of the right basal ganglia, and also to the 3 focal areas of hyperdensity suspicious for micro hemorrhages. 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 Three quarter bolus dose of Cangrelor was then given intravenously in order to salvage the occluding stent in the right internal carotid artery. The balloon guide was then removed following removal of the exchange micro guidewire. The diagnostic catheter was then positioned in the left common carotid artery. This demonstrated the left external carotid artery and the left internal carotid arteries to be widely patent. Patency of the left internal carotid to the  cranial skull base was verified. Also the left middle and the left anterior cerebral arteries were seen to opacify into the capillary and venous phases. Also almost simultaneously cross-filling via the anterior communicating artery of the right anterior and the right middle cerebral arteries is noted via the anterior communicating artery. No gross filling defects were seen in the middle cerebral artery distribution. 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 The diagnostic catheter was removed. The 8 French Pinnacle sheath was then exchanged for a short 8 [place] sheath which in turn was then removed with the successful application of the 7 [place] ExoSeal closure device with hemostasis in the right groin. Distal pulses remained palpable in the feet bilaterally unchanged. A second CT scan of the brain demonstrated no significant change in the right basal ganglia with continued presence of at least 3 foci of hemorrhage. No mass effect or midline shift was seen. Patient was left intubated on account of patient's medical condition and inability to respond appropriately to protect the airway. Patient was then transferred to the neuro ICU for post recanalization management. IMPRESSION: Status post endovascular complete revascularization of the right middle cerebral artery and the right anterior cerebral artery with 1 pass with a Solitaire 4 mm x 40 mm X retrieval device and aspiration, and 1 pass with  the Tiger 21 retrieval device with proximal aspiration, and with 1 pass with contact aspiration achieving a TICI 2c revascularization of the right middle cerebral artery distribution. 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 Attempt to  rescue with 3/4 bolus dose of Cangrelor IV. PLAN: Follow-up in the clinic 4 to 6 weeks post discharge. Electronically Signed   By: Luanne Bras M.D.   On: 06/30/2020 13:20   IR CT Head [facility]  Result Date: 07/02/2020 INDICATION: New onset right gaze deviation, left hemiplegia, and left-sided neglect. EXAM: 1. EMERGENT LARGE VESSEL OCCLUSION THROMBOLYSIS anterior CIRCULATION) COMPARISON:  CT angiogram of the head and neck of June 29, 2020. MEDICATIONS: Ancef 2 g IV was administered within 1 hour of the procedure. ANESTHESIA/SEDATION: General anesthesia CONTRAST:  Isovue 300 approximately 165 mL FLUOROSCOPY TIME:  Fluoroscopy Time: 97 minutes 0 seconds (3622 mGy). COMPLICATIONS: None immediate. TECHNIQUE: Following a full explanation of the procedure along with the potential associated complications, an informed witnessed consent was obtained from the spouse. The risks of intracranial hemorrhage of 10%, worsening neurological deficit, ventilator dependency, death and inability to revascularize were all reviewed in detail with the patient's spouse. The patient was then put under general anesthesia by the Department of Anesthesiology at [facility]. The right groin was prepped and draped in the usual sterile fashion. Thereafter using modified Seldinger technique, transfemoral access into the right common femoral artery was obtained without difficulty. Over a 0.035 inch guidewire an 8 French 25 Pinnacle sheath was inserted. Through this, and also over a 0.035 inch guidewire a 5 French 125 cm Berenstein select inside of the balloon guide catheter combinationwas advanced to the aortic arch region and selectively positioned in the distal right common carotid artery. Guidewire, and the select catheter removed. Good aspiration obtained from the hub of the balloon guide catheter in the right common carotid artery. Arteriogram was then performed centered extra cranially and intracranially. FINDINGS: The right  common carotid arteriogram demonstrates the right external carotid artery and its major branches to be widely patent. The right internal carotid artery at the bulb demonstrates a moderate sized plaque associated with complete occlusion just distal to the bulb. No evidence of distal angiographic string sign, or reconstitution of right internal carotid artery in the cavernous segment was noted. PROCEDURE: Through the balloon guide catheter, a combination of an 021 Trevo ProVue microcatheter over a 0.014 inch standard Synchro micro guidewire with a J configuration, access through the occluded right internal carotid artery was obtained with the micro guidewire leading followed by the microcatheter. The combination was advanced to the horizontal petrous segment where the micro guidewire was removed. Poor aspiration was obtained from the microcatheter hub on account of the extensive clot in the entire right internal carotid artery intracranially and extra cranially. The microcatheter was then exchanged for a 014 inch Synchro standard 300 cm exchange micro guidewire with a J-tip configuration. A control arteriogram performed through the balloon guide demonstrated minimally improved caliber through the proximal right internal carotid artery. A 4 mm x 30 mm Viatrac 14 angioplasty balloon catheter which had been prepped with 50% contrast and 50% heparinized saline infusion and integrated with 75% contrast and 25% heparinized saline infusion was advanced over the exchange micro guidewire using the rapid exchange technique. The proximal and distal markers were then positioned in the described landing zones. A control inflation was then performed using micro  inflation syringe device via micro tubing to 12 atmospheres. This was maintained for about 20 seconds and retrieved proximally. A control arteriogram performed through the balloon guide demonstrated improved caliber in the bulb but there continued be a high-grade stenosis in  the proximal right internal carotid artery. This prompted a second angioplasty in the manner as described above again to 12 atmospheres for approximately 20 seconds. The balloon was then deflated and retrieved and removed. A control arteriogram performed through the balloon guide demonstrated significantly improved caliber and flow through the angioplastied segment with improved flow to the more distal right internal carotid artery. However, there continued to be extensive clot noted within the internal carotid artery at the cranial skull base and the supraclinoid right ICA. An 021 Trevo ProVue catheter inside of a 071 134 cm aspiration catheter, combination was advanced over the exchange micro guidewire to the cavernous segment of the right internal carotid artery. Exchange micro guidewire was replaced with a regular 014 inch standard Synchro micro guidewire with a J configuration. This was then advanced using a torque device through the occluded right middle cerebral artery into one of the inferior division branches in the M2 M3 region followed by the microcatheter. The guidewire was removed. Good aspiration obtained from the hub of the microcatheter. A gentle control arteriogram performed through this demonstrated slow flow through the vessel itself. A 4 mm x 40 mm Solitaire X retrieval device was then advanced to the distal end of the microcatheter. The retriever was deployed in the usual manner. The Zoom aspiration catheter was advanced to the mid right M1 segment. Continuous aspiration was then performed at the hub of the aspiration catheter for 2-1/2 minutes, with proximal flow arrest in the right internal carotid artery. The retrieval device, the microcatheter and the aspiration catheter were retrieved and removed. Copious amounts of clot were captured into the [place], and also in the retrieval device. Following reversal of flow arrest, a control arteriogram performed through the balloon guide catheter  demonstrated now improved flow through the proximal right internal carotid artery extra cranially and intracranially. There is now patency of the right anterior cerebral artery with a stump of the occluded right middle cerebral artery. A second pass was then made again using the above combination. The microcatheter was now accessed into the superior division of the right middle cerebral artery M2 M3 region over a 0.014 inch standard Synchro micro guidewire. Free aspiration of a blood was obtained at the hub of the microcatheter. A Tiger 21 retrieval device was then advanced to the distal end of the retrieval device. This was then deployed by retrieving the microcatheter. The proximal portion of the retrieval device was within the proximal aspect of the occluded right middle cerebral artery. Free aspiration was then started through the aspiration catheter with the Penumbra aspiration device which was positioned in the distal portion of the occluded clot. The retrieval device was then opened and closed until there was kinking at the proximal portion. The microcatheter was then advanced to the proximal marker on the retrieval device. With constant aspiration continued, the Tiger 21 retrieval device was then removed as constant aspiration was applied at the hub of the aspiration catheter, and also with proximal flow arrest and aspiration at the hub of the balloon guide with a 60 mL syringe. Multiple fragments of clot were obtained within the [place], and also entangled in the retrieval device. With reversal of the proximal flow arrest, a control arteriogram performed through the  balloon guide in the right internal carotid artery now demonstrated improved opacification of the right middle cerebral artery to where there was now opacification of the anterior temporal branch. Distal branch remains occluded with no change in anterior cerebral artery. A third pass was now made using an 027 150 cm Marksman microcatheter  which was advanced again using the combination of the 071 Zoom aspiration catheter over a 0.014 inch standard Synchro micro guidewire. This combination was advanced into the now the inferior division with the microcatheter advanced into the M2 region of the inferior division. The micro guidewire was removed. Good aspiration obtained from the hub of the 027 microcatheter. A gentle control arteriogram performed through this now demonstrated free flow into the distal vasculature. The aspiration catheter was engaged into the occluded distal right middle cerebral artery at the origin of the inferior division. Aspiration was now at the hub of the aspiration catheter embedded in the right middle cerebral artery clot and also at the hub of the Marksman catheter for approximately 2 minutes, with proximal flow arrest in the right internal carotid artery and constant aspiration at its hub. The Marksman catheter was then gently retrieved while maintaining aspiration through the Zoom aspiration catheter. The Zoom aspiration catheter remained loft to aspiration. This was then retrieved and removed. Again clots were seen in the aspirate. Control arteriogram performed following reversal of flow arrest in the right internal carotid artery now demonstrated complete revascularization of the right MCA distribution, into the distal distribution achieving a TICI 2c revascularization. The right anterior cerebral artery remained widely patent. Measurements were now performed of the right internal carotid artery proximally, and the right common carotid artery distally at the proposed landing zones for the revascularization stent. An 021 Trevo ProVue microcatheter was advanced to the petrous segment of the right internal carotid artery over a 0.014 inch standard Synchro micro guidewire, and replaced with an 014 inch 300 cm Synchro exchange micro guidewire. Safe position of the tip of the exchange micro guidewire was verified. It was elected  to proceed with placement of a 6/8 mm x 40 mm Xact stent across the diseased previously occluded right internal carotid artery proximally. This was retrogradely purged with heparinized saline infusion. Using the rapid exchange technique, the delivery system of the stent was then advanced and positioned covering distally, and also the proximal portion of the landing zones. Stent was deployed in the usual manner without any difficulty. The delivery system was removed. Good aspiration obtained from the hub of the balloon guide in the right common carotid artery. A control arteriogram performed through this demonstrated excellent positioning and apposition of the stent with now free flow noted into the intracranial right ICA and the right middle and right anterior cerebral arteries being widely patent. Prior to this patient was loaded with 81 mg aspirin, and 180 mg of Brilinta via an orogastric tube. CT scan of the brain was then performed which demonstrated contrast blush of the right basal ganglia, and also to the 3 focal areas of hyperdensity suspicious for micro hemorrhages. 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 Three quarter bolus dose of Cangrelor was then given intravenously in order to salvage the occluding stent in the right internal carotid artery. The balloon guide was then removed following removal of the exchange micro guidewire. The diagnostic catheter was then positioned in  the left common carotid artery. This demonstrated the left external carotid artery and the left internal carotid arteries to be widely patent. Patency of the left internal carotid to the cranial skull base was verified. Also the left middle and the left anterior cerebral arteries were seen to opacify into the capillary and venous phases. Also  almost simultaneously cross-filling via the anterior communicating artery of the right anterior and the right middle cerebral arteries is noted via the anterior communicating artery. No gross filling defects were seen in the middle cerebral artery distribution. 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 The diagnostic catheter was removed. The 8 French Pinnacle sheath was then exchanged for a short 8 [place] sheath which in turn was then removed with the successful application of the 7 [place] ExoSeal closure device with hemostasis in the right groin. Distal pulses remained palpable in the feet bilaterally unchanged. A second CT scan of the brain demonstrated no significant change in the right basal ganglia with continued presence of at least 3 foci of hemorrhage. No mass effect or midline shift was seen. Patient was left intubated on account of patient's medical condition and inability to respond appropriately to protect the airway. Patient was then transferred to the neuro ICU for post recanalization management. IMPRESSION: Status post endovascular complete revascularization of the right middle cerebral artery and the right anterior cerebral artery with 1 pass with a Solitaire 4 mm x 40 mm X retrieval device and aspiration, and 1 pass with the Tiger 21 retrieval device with proximal aspiration, and with 1 pass with contact aspiration achieving a TICI 2c revascularization of the right middle cerebral artery distribution. 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 Attempt to rescue with 3/4 bolus dose of Cangrelor IV. PLAN: Follow-up in the clinic 4 to 6 weeks post discharge. Electronically Signed   By: Luanne Bras M.D.   On:  06/30/2020 13:20   CT Code Stroke Cerebral Perfusion with contrast  Result Date: 06/29/2020 CLINICAL DATA:  Hyperdense vessel and stroke-like symptoms EXAM: CT ANGIOGRAPHY HEAD AND NECK CT PERFUSION BRAIN TECHNIQUE: Multidetector CT imaging of the head and neck was performed using the standard protocol during bolus administration of intravenous contrast. Multiplanar CT image reconstructions and MIPs were obtained to evaluate the vascular anatomy. Carotid stenosis measurements (when applicable) are obtained utilizing NASCET criteria, using the distal internal carotid diameter as the denominator. Multiphase CT imaging of the brain was performed following IV bolus contrast injection. Subsequent parametric perfusion maps were calculated using RAPID software. CONTRAST:  Dose is not yet known COMPARISON:  None. FINDINGS: CTA NECK FINDINGS Aortic arch: Mild atheromatous changes. Three vessel branching. No acute finding. Right carotid system: Atherosclerotic plaque about the bifurcation with occluded ICA bulb. No flow seen within the ICA in the neck. Left carotid system: Atheromatous plaque at the bifurcation and bulb. No stenosis or ulceration. Negative for beading. Vertebral arteries: Low-density plaque at the left subclavian origin. No flow limiting subclavian stenosis. Plaque at the left vertebral origin without stenosis, beading, or dissection. Skeleton: Focal advanced C5-6 disc degeneration with ridging causing biforaminal impingement. Ordinary facet osteoarthritis. Other neck: No acute or aggressive finding. Right-sided chronic sinusitis with pattern middle meatus obstruction. Upper chest: Granulomatous nodal calcifications. There is also a calcified granuloma appearance in the left upper lobe. Review of  the MIP images confirms the above findings CTA HEAD FINDINGS Anterior circulation: No flow seen in the right carotid or MCA branches. There is distal M2 M3 branch reconstitution. Calcified plaque along the left  carotid siphon. No MCA branch occlusion, aneurysm, or beading. Posterior circulation: The vertebral and basilar arteries are smooth and widely patent. High-grade atheromatous narrowings of the bilateral PCA, P3 segment on the right and upper first order branch on the left. Venous sinuses: Patent as permitted by contrast timing Anatomic variants: None significant Review of the MIP images confirms the above findings CT Brain Perfusion Findings: ASPECTS: 9 CBF (<30%) Volume: 13mL Perfusion (Tmax>6.0s) volume: 178mL Mismatch Volume: 139mL Infarction Location:Right basal ganglia and lesser extensive lateral frontal cortex infarct. Critical Value/emergent results were called by telephone at the time of interpretation on 06/29/2020 at 9:37 am to provider ERIC [facility] , who verbally acknowledged these results. IMPRESSION: 1. Emergent large vessel occlusion with no flow seen in the right internal carotid or proximal MCA. There is a 36 cc core infarct and 142 cc of penumbra by CT perfusion. 2. Atherosclerosis without flow limiting stenosis of the other major vessels. 3. Bilateral high-grade atheromatous PCA narrowings, more proximal on the right. 4. 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 Electronically Signed   By: Monte Fantasia M.D.   On: 06/29/2020 09:53   DG CHEST PORT 1 VIEW  Result Date: 07/06/2020 CLINICAL DATA:  Fever EXAM: PORTABLE CHEST 1 VIEW COMPARISON:  June 30, 2020 FINDINGS: Endotracheal tube and nasogastric tube no longer present. There is no edema or airspace opacity. Heart size is upper normal with pulmonary vascularity normal. No adenopathy. No bone lesions. IMPRESSION: No edema or airspace opacity.  Heart upper normal in size. Electronically Signed   By: Lowella Grip III M.D.   On: 07/06/2020 10:57   Portable Chest xray  Result Date: 06/30/2020 CLINICAL DATA:  Respiratory failure.  Stroke. EXAM: PORTABLE CHEST 1 VIEW COMPARISON:  06/29/2020 FINDINGS: 0538  hours. Endotracheal tube tip is 7.4 cm above the base of the carina. The lungs are clear without focal pneumonia, edema, pneumothorax or pleural effusion. The cardiopericardial silhouette is within normal limits for size. The visualized bony structures of the thorax show no acute abnormality. Telemetry leads overlie the chest. IMPRESSION: No acute cardiopulmonary findings. Electronically Signed   By: Misty Stanley M.D.   On: 06/30/2020 07:30   DG CHEST PORT 1 VIEW  Result Date: 06/29/2020 CLINICAL DATA:  Code stroke with coiling intubated EXAM: PORTABLE CHEST 1 VIEW COMPARISON:  None. FINDINGS: Endotracheal tube tip is about 4.2 cm superior to the carina. Esophageal tube tip is below the diaphragm but incompletely visualized. No focal opacity or pleural effusion. Normal cardiac size. No pneumothorax. IMPRESSION: Endotracheal tube tip about 4.2 cm superior to the carina. Lungs grossly clear Electronically Signed   By: Donavan Foil M.D.   On: 06/29/2020 16:00   DG Swallowing Func-Speech Pathology  Result Date: 07/01/2020 Objective Swallowing Evaluation: Type of Study: Bedside Swallow Evaluation  Patient Details Name: XYLAN SHEILS MRN: [id number] Date of Birth: 05-28-1951 Today's Date: 07/01/2020 Time: SLP Start Time (ACUTE ONLY): 1252 -SLP Stop Time (ACUTE ONLY): 1310 SLP Time Calculation (min) (ACUTE ONLY): 18 min Past Medical History: No past medical history on file. Past Surgical History: Past Surgical History: Procedure Laterality Date . RADIOLOGY WITH ANESTHESIA N/A 06/29/2020  Procedure: IR WITH ANESTHESIA;  Surgeon: Radiologist, Medication, MD;  Location: [facility];  Service: Radiology;  Laterality: N/A; HPI: Patient is a  69 y/o male who presents with left sided weakness, right gaze and slurred speech. NIH:17. Head CT- dense Right MCA infarct. CTA- Right ICA and MCA occlusions. s/p right MCA thrombectomy and right ICA stent placement 06/29/20 with post procedure hemmorhage. ETT 10/4-10/5. No PMH.  Subjective:  alert, cooperative Assessment / Plan / Recommendation CHL IP CLINICAL IMPRESSIONS 07/01/2020 Clinical Impression Pt has an oral more than pharyngeal dysphagia, with oral preparation and transit impacted by reduced strength and suspect sensation as well. He has intermittent trouble getting liquids via cup or straw, with reduced seal and sucking. There is anterior spillage with thin liquids; L buccal pocketing with more solid consistencies and slower posterior transit. Mild premature spillage occurs with thin liquids. He has relatively improved pharyngeal function with only mildly reduced base of tongue retraction that results in trace-mild vallecular residue with purees and mild residue with solids. Recommend starting Dys 1 diet and thin liquids. Will f/u for advancement of solids clinically.  SLP Visit Diagnosis Dysphagia, oropharyngeal phase (R13.12) Attention and concentration deficit following -- Frontal lobe and executive function deficit following -- Impact on safety and function Mild aspiration risk   CHL IP TREATMENT RECOMMENDATION 07/01/2020 Treatment Recommendations Therapy as outlined in treatment plan below   Prognosis 07/01/2020 Prognosis for Safe Diet Advancement Good Barriers to Reach Goals -- Barriers/Prognosis Comment -- CHL IP DIET RECOMMENDATION 07/01/2020 SLP Diet Recommendations Dysphagia 1 (Puree) solids;Thin liquid Liquid Administration via Straw;Cup Medication Administration Crushed with puree Compensations Minimize environmental distractions;Slow rate;Small sips/bites;Lingual sweep for clearance of pocketing Postural Changes Seated upright at 90 degrees   CHL IP OTHER RECOMMENDATIONS 07/01/2020 Recommended Consults -- Oral Care Recommendations Oral care before and after PO Other Recommendations Have oral suction available   CHL IP FOLLOW UP RECOMMENDATIONS 07/01/2020 Follow up Recommendations Inpatient Rehab   CHL IP FREQUENCY AND DURATION 07/01/2020 Speech Therapy Frequency (ACUTE ONLY) min 2x/week  Treatment Duration 2 weeks      CHL IP ORAL PHASE 07/01/2020 Oral Phase Impaired Oral - Pudding Teaspoon -- Oral - Pudding Cup -- Oral - Honey Teaspoon -- Oral - Honey Cup -- Oral - Nectar Teaspoon -- Oral - Nectar Cup -- Oral - Nectar Straw -- Oral - Thin Teaspoon -- Oral - Thin Cup Left anterior bolus loss;Weak lingual manipulation;Decreased bolus cohesion;Premature spillage Oral - Thin Straw Weak lingual manipulation;Decreased bolus cohesion;Premature spillage Oral - Puree Weak lingual manipulation;Decreased bolus cohesion;Premature spillage Oral - Mech Soft Weak lingual manipulation;Decreased bolus cohesion;Premature spillage;Left pocketing in lateral sulci Oral - Regular -- Oral - Multi-Consistency -- Oral - Pill -- Oral Phase - Comment --  CHL IP PHARYNGEAL PHASE 07/01/2020 Pharyngeal Phase Impaired Pharyngeal- Pudding Teaspoon -- Pharyngeal -- Pharyngeal- Pudding Cup -- Pharyngeal -- Pharyngeal- Honey Teaspoon -- Pharyngeal -- Pharyngeal- Honey Cup -- Pharyngeal -- Pharyngeal- Nectar Teaspoon -- Pharyngeal -- Pharyngeal- Nectar Cup -- Pharyngeal -- Pharyngeal- Nectar Straw -- Pharyngeal -- Pharyngeal- Thin Teaspoon -- Pharyngeal -- Pharyngeal- Thin Cup Delayed swallow initiation-pyriform sinuses;Reduced tongue base retraction Pharyngeal -- Pharyngeal- Thin Straw Delayed swallow initiation-pyriform sinuses;Reduced tongue base retraction Pharyngeal -- Pharyngeal- Puree Reduced tongue base retraction;Pharyngeal residue - valleculae Pharyngeal -- Pharyngeal- Mechanical Soft Reduced tongue base retraction;Pharyngeal residue - valleculae Pharyngeal -- Pharyngeal- Regular -- Pharyngeal -- Pharyngeal- Multi-consistency -- Pharyngeal -- Pharyngeal- Pill -- Pharyngeal -- Pharyngeal Comment --  CHL IP CERVICAL ESOPHAGEAL PHASE 07/01/2020 Cervical Esophageal Phase WFL Pudding Teaspoon -- Pudding Cup -- Honey Teaspoon -- Honey Cup -- Nectar Teaspoon -- Nectar Cup -- Nectar Straw -- Thin Teaspoon --  Thin Cup -- Thin Straw --  Puree -- Mechanical Soft -- Regular -- Multi-consistency -- Pill -- Cervical Esophageal Comment -- [name], M.A. [facility] Pager [phone] Office [phone] 07/01/2020, 2:21 PM              EEG adult  Result Date: 07/02/2020 Lora Havens, MD     07/02/2020  6:23 PM Patient Name: AMROM ORE MRN: [id number] Epilepsy Attending: Lora Havens Referring Physician/Provider: Dr Rosalin Hawking Date: 07/02/2020 Duration: 24.26 mins Patient history: 69 y.o. male with history of gout admitted for left-sided weakness, right gaze, left facial droop with fall. MRI showed acute infarct at the right basal ganglia with nonprogressive hemorrhage as well as less extensive patchy acute cortical infarct in the right MCA territory EEG to evaluate for seizure. Level of alertness: Awake, asleep AEDs during EEG study: None Technical aspects: This EEG study was done with scalp electrodes positioned according to the 10-20 International system of electrode placement. Electrical activity was acquired at a sampling rate of 500Hz  and reviewed with a high frequency filter of 70Hz  and a low frequency filter of 1Hz . EEG data were recorded continuously and digitally stored. Description: The posterior dominant rhythm consists of 8 Hz activity of moderate voltage (25-35 uV) seen predominantly in posterior head regions, asymmetric ( R<L) and reactive to eye opening and eye closing. Sleep was characterized by vertex waves, sleep spindles (12 to 14 Hz), maximal frontocentral region.  EEG showed continuous 3 to 5 Hz theta-delta slowing in right hemisphere.  Hyperventilation and photic stimulation were not performed.   ABNORMALITY -Continuous slow, right hemisphere - Background asymmetry, right<left IMPRESSION: This study is suggestive of cortical dysfunction in right hemisphere consistent with underlying stroke. No seizures or epileptiform discharges were seen throughout the recording. Lora Havens    ECHOCARDIOGRAM COMPLETE  Result Date: 06/30/2020    ECHOCARDIOGRAM REPORT   Patient Name:   CLEMMIE MARXEN Date of Exam: 06/30/2020 Medical Rec #:  [id number]     Height:       74.0 in Accession #:    [id number]    Weight:       187.6 lb Date of Birth:  1951/08/18     BSA:          2.115 m Patient Age:    41 years      BP:           162/65 mmHg Patient Gender: M             HR:           52 bpm. Exam Location:  Inpatient Procedure: 2D Echo, Cardiac Doppler, Color Doppler and Intracardiac            Opacification Agent Indications:    CVA  History:        Patient has no prior history of Echocardiogram examinations.                 Stroke and COPD, Signs/Symptoms:Resp. failure; Risk                 Factors:Hypertension.  Sonographer:    Dustin Flock Referring Phys: [phone] ERIC LINDZEN  Sonographer Comments: Technically difficult study due to poor echo windows and echo performed with patient supine and on artificial respirator. Image acquisition challenging due to COPD and Image acquisition challenging due to respiratory motion. IMPRESSIONS  1. Left ventricular ejection fraction, by estimation, is 50 to 55%. The left ventricle has low  normal function. The left ventricle has no regional wall motion abnormalities. Left ventricular diastolic parameters are consistent with Grade I diastolic dysfunction (impaired relaxation).  2. Right ventricular systolic function is normal. The right ventricular size is normal. There is mildly elevated pulmonary artery systolic pressure.  3. Left atrial size was mildly dilated.  4. The mitral valve is normal in structure. No evidence of mitral valve regurgitation. No evidence of mitral stenosis.  5. The aortic valve is normal in structure. Aortic valve regurgitation is not visualized. No aortic stenosis is present.  6. The inferior vena cava is dilated in size with <50% respiratory variability, suggesting right atrial pressure of 15 mmHg. FINDINGS  Left Ventricle: Left ventricular  ejection fraction, by estimation, is 50 to 55%. The left ventricle has low normal function. The left ventricle has no regional wall motion abnormalities. Definity contrast agent was given IV to delineate the left ventricular endocardial borders. The left ventricular internal cavity size was normal in size. There is no left ventricular hypertrophy. Left ventricular diastolic parameters are consistent with Grade I diastolic dysfunction (impaired relaxation). Indeterminate filling pressures. Right Ventricle: The right ventricular size is normal. No increase in right ventricular wall thickness. Right ventricular systolic function is normal. There is mildly elevated pulmonary artery systolic pressure. The tricuspid regurgitant velocity is 2.43  m/s, and with an assumed right atrial pressure of 15 mmHg, the estimated right ventricular systolic pressure is 67.3 mmHg. Left Atrium: Left atrial size was mildly dilated. Right Atrium: Right atrial size was normal in size. Pericardium: There is no evidence of pericardial effusion. Mitral Valve: The mitral valve is normal in structure. No evidence of mitral valve regurgitation. No evidence of mitral valve stenosis. Tricuspid Valve: The tricuspid valve is normal in structure. Tricuspid valve regurgitation is trivial. No evidence of tricuspid stenosis. Aortic Valve: The aortic valve is normal in structure. Aortic valve regurgitation is not visualized. No aortic stenosis is present. Pulmonic Valve: The pulmonic valve was normal in structure. Pulmonic valve regurgitation is not visualized. No evidence of pulmonic stenosis. Aorta: The aortic root is normal in size and structure. Venous: The inferior vena cava is dilated in size with less than 50% respiratory variability, suggesting right atrial pressure of 15 mmHg. IAS/Shunts: No atrial level shunt detected by color flow Doppler.  LEFT VENTRICLE PLAX 2D LVIDd:         5.30 cm  Diastology LVIDs:         3.80 cm  LV e' medial:    6.85  cm/s LV PW:         1.30 cm  LV E/e' medial:  10.2 LV IVS:        1.00 cm  LV e' lateral:   12.10 cm/s LVOT diam:     2.10 cm  LV E/e' lateral: 5.8 LV SV:         106 LV SV Index:   50 LVOT Area:     3.46 cm  RIGHT VENTRICLE RV Basal diam:  3.80 cm RV S prime:     7.07 cm/s TAPSE (M-mode): 3.3 cm LEFT ATRIUM           Index       RIGHT ATRIUM           Index LA diam:      4.30 cm 2.03 cm/m  RA Area:     21.00 cm LA Vol (A4C): 45.5 ml 21.51 ml/m RA Volume:   67.30 ml  31.82 ml/m  AORTIC  VALVE LVOT Vmax:   150.00 cm/s LVOT Vmean:  90.400 cm/s LVOT VTI:    0.305 m  AORTA Ao Root diam: 3.50 cm MITRAL VALVE               TRICUSPID VALVE MV Area (PHT): 2.80 cm    TR Peak grad:   23.6 mmHg MV Decel Time: 271 msec    TR Vmax:        243.00 cm/s MV E velocity: 69.80 cm/s MV A velocity: 68.60 cm/s  SHUNTS MV E/A ratio:  1.02        Systemic VTI:  0.30 m                            Systemic Diam: 2.10 cm Skeet Latch MD Electronically signed by Skeet Latch MD Signature Date/Time: 06/30/2020/11:25:18 AM    Final    IR PERCUTANEOUS ART THROMBECTOMY/INFUSION INTRACRANIAL INC DIAG ANGIO  Result Date: 07/02/2020 INDICATION: New onset right gaze deviation, left hemiplegia, and left-sided neglect. EXAM: 1. EMERGENT LARGE VESSEL OCCLUSION THROMBOLYSIS anterior CIRCULATION) COMPARISON:  CT angiogram of the head and neck of June 29, 2020. MEDICATIONS: Ancef 2 g IV was administered within 1 hour of the procedure. ANESTHESIA/SEDATION: General anesthesia CONTRAST:  Isovue 300 approximately 165 mL FLUOROSCOPY TIME:  Fluoroscopy Time: 97 minutes 0 seconds (3622 mGy). COMPLICATIONS: None immediate. TECHNIQUE: Following a full explanation of the procedure along with the potential associated complications, an informed witnessed consent was obtained from the spouse. The risks of intracranial hemorrhage of 10%, worsening neurological deficit, ventilator dependency, death and inability to revascularize were all reviewed in  detail with the patient's spouse. The patient was then put under general anesthesia by the Department of Anesthesiology at [facility]. The right groin was prepped and draped in the usual sterile fashion. Thereafter using modified Seldinger technique, transfemoral access into the right common femoral artery was obtained without difficulty. Over a 0.035 inch guidewire an 8 French 25 Pinnacle sheath was inserted. Through this, and also over a 0.035 inch guidewire a 5 French 125 cm Berenstein select inside of the balloon guide catheter combinationwas advanced to the aortic arch region and selectively positioned in the distal right common carotid artery. Guidewire, and the select catheter removed. Good aspiration obtained from the hub of the balloon guide catheter in the right common carotid artery. Arteriogram was then performed centered extra cranially and intracranially. FINDINGS: The right common carotid arteriogram demonstrates the right external carotid artery and its major branches to be widely patent. The right internal carotid artery at the bulb demonstrates a moderate sized plaque associated with complete occlusion just distal to the bulb. No evidence of distal angiographic string sign, or reconstitution of right internal carotid artery in the cavernous segment was noted. PROCEDURE: Through the balloon guide catheter, a combination of an 021 Trevo ProVue microcatheter over a 0.014 inch standard Synchro micro guidewire with a J configuration, access through the occluded right internal carotid artery was obtained with the micro guidewire leading followed by the microcatheter. The combination was advanced to the horizontal petrous segment where the micro guidewire was removed. Poor aspiration was obtained from the microcatheter hub on account of the extensive clot in the entire right internal carotid artery intracranially and extra cranially. The microcatheter was then exchanged for a 014 inch Synchro  standard 300 cm exchange micro guidewire with a J-tip configuration. A control arteriogram performed through the balloon guide demonstrated minimally improved caliber  through the proximal right internal carotid artery. A 4 mm x 30 mm Viatrac 14 angioplasty balloon catheter which had been prepped with 50% contrast and 50% heparinized saline infusion and integrated with 75% contrast and 25% heparinized saline infusion was advanced over the exchange micro guidewire using the rapid exchange technique. The proximal and distal markers were then positioned in the described landing zones. A control inflation was then performed using micro inflation syringe device via micro tubing to 12 atmospheres. This was maintained for about 20 seconds and retrieved proximally. A control arteriogram performed through the balloon guide demonstrated improved caliber in the bulb but there continued be a high-grade stenosis in the proximal right internal carotid artery. This prompted a second angioplasty in the manner as described above again to 12 atmospheres for approximately 20 seconds. The balloon was then deflated and retrieved and removed. A control arteriogram performed through the balloon guide demonstrated significantly improved caliber and flow through the angioplastied segment with improved flow to the more distal right internal carotid artery. However, there continued to be extensive clot noted within the internal carotid artery at the cranial skull base and the supraclinoid right ICA. An 021 Trevo ProVue catheter inside of a 071 134 cm aspiration catheter, combination was advanced over the exchange micro guidewire to the cavernous segment of the right internal carotid artery. Exchange micro guidewire was replaced with a regular 014 inch standard Synchro micro guidewire with a J configuration. This was then advanced using a torque device through the occluded right middle cerebral artery into one of the inferior division branches  in the M2 M3 region followed by the microcatheter. The guidewire was removed. Good aspiration obtained from the hub of the microcatheter. A gentle control arteriogram performed through this demonstrated slow flow through the vessel itself. A 4 mm x 40 mm Solitaire X retrieval device was then advanced to the distal end of the microcatheter. The retriever was deployed in the usual manner. The Zoom aspiration catheter was advanced to the mid right M1 segment. Continuous aspiration was then performed at the hub of the aspiration catheter for 2-1/2 minutes, with proximal flow arrest in the right internal carotid artery. The retrieval device, the microcatheter and the aspiration catheter were retrieved and removed. Copious amounts of clot were captured into the [place], and also in the retrieval device. Following reversal of flow arrest, a control arteriogram performed through the balloon guide catheter demonstrated now improved flow through the proximal right internal carotid artery extra cranially and intracranially. There is now patency of the right anterior cerebral artery with a stump of the occluded right middle cerebral artery. A second pass was then made again using the above combination. The microcatheter was now accessed into the superior division of the right middle cerebral artery M2 M3 region over a 0.014 inch standard Synchro micro guidewire. Free aspiration of a blood was obtained at the hub of the microcatheter. A Tiger 21 retrieval device was then advanced to the distal end of the retrieval device. This was then deployed by retrieving the microcatheter. The proximal portion of the retrieval device was within the proximal aspect of the occluded right middle cerebral artery. Free aspiration was then started through the aspiration catheter with the Penumbra aspiration device which was positioned in the distal portion of the occluded clot. The retrieval device was then opened and closed until there was  kinking at the proximal portion. The microcatheter was then advanced to the proximal marker on the retrieval device. With  constant aspiration continued, the Tiger 21 retrieval device was then removed as constant aspiration was applied at the hub of the aspiration catheter, and also with proximal flow arrest and aspiration at the hub of the balloon guide with a 60 mL syringe. Multiple fragments of clot were obtained within the [place], and also entangled in the retrieval device. With reversal of the proximal flow arrest, a control arteriogram performed through the balloon guide in the right internal carotid artery now demonstrated improved opacification of the right middle cerebral artery to where there was now opacification of the anterior temporal branch. Distal branch remains occluded with no change in anterior cerebral artery. A third pass was now made using an 027 150 cm Marksman microcatheter which was advanced again using the combination of the 071 Zoom aspiration catheter over a 0.014 inch standard Synchro micro guidewire. This combination was advanced into the now the inferior division with the microcatheter advanced into the M2 region of the inferior division. The micro guidewire was removed. Good aspiration obtained from the hub of the 027 microcatheter. A gentle control arteriogram performed through this now demonstrated free flow into the distal vasculature. The aspiration catheter was engaged into the occluded distal right middle cerebral artery at the origin of the inferior division. Aspiration was now at the hub of the aspiration catheter embedded in the right middle cerebral artery clot and also at the hub of the Marksman catheter for approximately 2 minutes, with proximal flow arrest in the right internal carotid artery and constant aspiration at its hub. The Marksman catheter was then gently retrieved while maintaining aspiration through the Zoom aspiration catheter. The Zoom aspiration  catheter remained loft to aspiration. This was then retrieved and removed. Again clots were seen in the aspirate. Control arteriogram performed following reversal of flow arrest in the right internal carotid artery now demonstrated complete revascularization of the right MCA distribution, into the distal distribution achieving a TICI 2c revascularization. The right anterior cerebral artery remained widely patent. Measurements were now performed of the right internal carotid artery proximally, and the right common carotid artery distally at the proposed landing zones for the revascularization stent. An 021 Trevo ProVue microcatheter was advanced to the petrous segment of the right internal carotid artery over a 0.014 inch standard Synchro micro guidewire, and replaced with an 014 inch 300 cm Synchro exchange micro guidewire. Safe position of the tip of the exchange micro guidewire was verified. It was elected to proceed with placement of a 6/8 mm x 40 mm Xact stent across the diseased previously occluded right internal carotid artery proximally. This was retrogradely purged with heparinized saline infusion. Using the rapid exchange technique, the delivery system of the stent was then advanced and positioned covering distally, and also the proximal portion of the landing zones. Stent was deployed in the usual manner without any difficulty. The delivery system was removed. Good aspiration obtained from the hub of the balloon guide in the right common carotid artery. A control arteriogram performed through this demonstrated excellent positioning and apposition of the stent with now free flow noted into the intracranial right ICA and the right middle and right anterior cerebral arteries being widely patent. Prior to this patient was loaded with 81 mg aspirin, and 180 mg of Brilinta via an orogastric tube. CT scan of the brain was then performed which demonstrated contrast blush of the right basal ganglia, and also to the  3 focal areas of hyperdensity suspicious for micro hemorrhages. 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 Three quarter bolus dose of Cangrelor was then given intravenously in order to salvage the occluding stent in the right internal carotid artery. The balloon guide was then removed following removal of the exchange micro guidewire. The diagnostic catheter was then positioned in the left common carotid artery. This demonstrated the left external carotid artery and the left internal carotid arteries to be widely patent. Patency of the left internal carotid to the cranial skull base was verified. Also the left middle and the left anterior cerebral arteries were seen to opacify into the capillary and venous phases. Also almost simultaneously cross-filling via the anterior communicating artery of the right anterior and the right middle cerebral arteries is noted via the anterior communicating artery. No gross filling defects were seen in the middle cerebral artery distribution. 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 The diagnostic catheter was removed. The 8 French Pinnacle sheath was then exchanged for a short 8 [place] sheath which in turn was then removed with the successful application of the 7 [place] ExoSeal closure device with hemostasis in the right groin. Distal pulses remained palpable in the feet bilaterally unchanged. A second CT scan of the brain demonstrated no significant change in the right basal ganglia with continued presence of at least 3 foci of hemorrhage. No mass effect or midline shift was seen. Patient was left intubated on account of patient's medical condition and inability to  respond appropriately to protect the airway. Patient was then transferred to the neuro ICU for post recanalization management. IMPRESSION: Status post endovascular complete revascularization of the right middle cerebral artery and the right anterior cerebral artery with 1 pass with a Solitaire 4 mm x 40 mm X retrieval device and aspiration, and 1 pass with the Tiger 21 retrieval device with proximal aspiration, and with 1 pass with contact aspiration achieving a TICI 2c revascularization of the right middle cerebral artery distribution. 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 Attempt to rescue with 3/4 bolus dose of Cangrelor IV. PLAN: Follow-up in the clinic 4 to 6 weeks post discharge. Electronically Signed   By: Luanne Bras M.D.   On: 06/30/2020 13:20   CT HEAD CODE STROKE WO CONTRAST  Result Date: 06/29/2020 CLINICAL DATA:  Code stroke.  Slurred speech and left facial droop EXAM: CT HEAD WITHOUT CONTRAST TECHNIQUE: Contiguous axial images were obtained from the base of the skull through the vertex without intravenous contrast. COMPARISON:  None. FINDINGS: Brain: Small remote appearing cortically based infarcts in the superior right frontal lobe. No hemorrhage, hydrocephalus, or masslike finding. Vascular: Hyperdense distal right ICA to MCA branches. Atherosclerotic calcification. Skull: Negative Sinuses/Orbits: Right maxillary, ethmoid, and frontal sinus opacification with sclerotic wall thickening at the maxillary sinus. Other: Critical Value/emergent results were called by telephone at the time of interpretation on 06/29/2020 at 9:23 am to provider Lindzen , who verbally acknowledged these results. ASPECTS [facility] Stroke Program Early CT Score) - Ganglionic level infarction (caudate, lentiform nuclei, internal capsule, insula, M1-M3  cortex): Posterior putamen appears blunted compared to the left. Equivocal for small insular cortex infarct. - Supraganglionic infarction (M4-M6 cortex): 3, when accounting for chronic  infarct Total score (0-10 with 10 being normal): 9, when accounting for chronic infarct IMPRESSION: 1. Hyperdense distal right ICA and proximal MCA. 2. Blunted appearance of the posterior right putamen. Chronic right high frontal cortex infarcts. ASPECTS is 9 when excluding the chronic changes. Electronically Signed   By: Monte Fantasia M.D.   On: 06/29/2020 09:25   VAS US CAROTID  Result Date: 07/02/2020 Carotid Arterial Duplex Study Indications:   CVA and Right stent. Other Factors: Rt ica stent- 06/29/20. Performing Technologist: [name] RVS  Examination Guidelines: A complete evaluation includes B-mode imaging, spectral Doppler, color Doppler, and power Doppler as needed of all accessible portions of each vessel. Bilateral testing is considered an integral part of a complete examination. Limited examinations for reoccurring indications may be performed as noted.  Right Carotid Findings: +----------+--------+--------+--------+------------------+--------+           PSV cm/sEDV cm/sStenosisPlaque DescriptionComments +----------+--------+--------+--------+------------------+--------+ CCA Prox  101                     heterogenous               +----------+--------+--------+--------+------------------+--------+ CCA Distal57                      heterogenous               +----------+--------+--------+--------+------------------+--------+ ECA       103     6                                          +----------+--------+--------+--------+------------------+--------+ +----------+--------+-------+--------+-------------------+           PSV cm/sEDV cmsDescribeArm Pressure (mmHG) +----------+--------+-------+--------+-------------------+ Subclavian160                                         +----------+--------+-------+--------+-------------------+ +---------+--------+--+--------+--+---------+ VertebralPSV cm/s63EDV cm/s12Antegrade +---------+--------+--+--------+--+---------+  Right Stent(s): +---------------+--+-+--------+++ Prox to Stent  274         +---------------+--+-+--------+++ Proximal Stent 49          +---------------+--+-+--------+++ Mid Stent         occluded +---------------+--+-+--------+++ Distal Stent      occluded +---------------+--+-+--------+++ Distal to Stent   occluded +---------------+--+-+--------+++   Left Carotid Findings: +----------+--------+--------+--------+------------------+--------+           PSV cm/sEDV cm/sStenosisPlaque DescriptionComments +----------+--------+--------+--------+------------------+--------+ CCA Prox  133     18              heterogenous               +----------+--------+--------+--------+------------------+--------+ CCA Distal95      17              heterogenous               +----------+--------+--------+--------+------------------+--------+ ICA Prox  97      22      1-39%   heterogenous               +----------+--------+--------+--------+------------------+--------+ ICA Distal108     36                                         +----------+--------+--------+--------+------------------+--------+ ECA       120  10                                         +----------+--------+--------+--------+------------------+--------+ +----------+--------+--------+--------+-------------------+           PSV cm/sEDV cm/sDescribeArm Pressure (mmHG) +----------+--------+--------+--------+-------------------+ [id number]                                         +----------+--------+--------+--------+-------------------+ +---------+--------+--+--------+--+---------+ VertebralPSV cm/s48EDV cm/s13Antegrade +---------+--------+--+--------+--+---------+   Summary:  Right Carotid: ICA stent appears occluded. Left Carotid: Velocities in the left ICA are consistent with a 1-39% stenosis. Vertebrals: Bilateral vertebral arteries demonstrate antegrade flow. *See table(s) above for measurements and observations.  Electronically signed by Antony Contras MD on 07/02/2020 at 8:34:52 AM.    Final    VAS [place] LOWER EXTREMITY VENOUS (DVT)  Result Date: 07/01/2020  Lower Venous DVTStudy Indications: Stroke.  Comparison Study: no prior Performing Technologist: [name] RVS  Examination Guidelines: A complete evaluation includes B-mode imaging, spectral Doppler, color Doppler, and power Doppler as needed of all accessible portions of each vessel. Bilateral testing is considered an integral part of a complete examination. Limited examinations for reoccurring indications may be performed as noted. The reflux portion of the exam is performed with the patient in reverse Trendelenburg.  +---------+---------------+---------+-----------+----------+--------------+ RIGHT    CompressibilityPhasicitySpontaneityPropertiesThrombus Aging +---------+---------------+---------+-----------+----------+--------------+ CFV      Full           Yes      Yes                                 +---------+---------------+---------+-----------+----------+--------------+ SFJ      Full                                                        +---------+---------------+---------+-----------+----------+--------------+ FV Prox  Full                                                        +---------+---------------+---------+-----------+----------+--------------+ FV Mid   Full                                                        +---------+---------------+---------+-----------+----------+--------------+ FV DistalFull                                                        +---------+---------------+---------+-----------+----------+--------------+ PFV      Full                                                         +---------+---------------+---------+-----------+----------+--------------+  POP      Full           Yes      Yes                                 +---------+---------------+---------+-----------+----------+--------------+ PTV      Full                                                        +---------+---------------+---------+-----------+----------+--------------+ PERO     Full                                                        +---------+---------------+---------+-----------+----------+--------------+   +---------+---------------+---------+-----------+----------+--------------+ LEFT     CompressibilityPhasicitySpontaneityPropertiesThrombus Aging +---------+---------------+---------+-----------+----------+--------------+ CFV      Full           Yes      Yes                                 +---------+---------------+---------+-----------+----------+--------------+ SFJ      Full                                                        +---------+---------------+---------+-----------+----------+--------------+ FV Prox  Full                                                        +---------+---------------+---------+-----------+----------+--------------+ FV Mid   Full                                                        +---------+---------------+---------+-----------+----------+--------------+ FV DistalFull                                                        +---------+---------------+---------+-----------+----------+--------------+ PFV      Full                                                        +---------+---------------+---------+-----------+----------+--------------+ POP      Full           Yes      Yes                                 +---------+---------------+---------+-----------+----------+--------------+  PTV      Full                                                         +---------+---------------+---------+-----------+----------+--------------+     Summary: BILATERAL: - No evidence of deep vein thrombosis seen in the lower extremities, bilaterally. - No evidence of superficial venous thrombosis in the lower extremities, bilaterally. -   *See table(s) above for measurements and observations. Electronically signed by Harold Barban MD on 07/01/2020 at 9:06:00 PM.    Final    US Abdomen Limited RUQ  Result Date: 07/08/2020 CLINICAL DATA:  Abnormal liver function tests EXAM: ULTRASOUND ABDOMEN LIMITED RIGHT UPPER QUADRANT COMPARISON:  None. FINDINGS: Gallbladder: No gallstones or wall thickening visualized. No sonographic Murphy sign noted by sonographer. Common bile duct: Diameter: 4 mm in proximal diameter Liver: No focal lesion identified. Within normal limits in parenchymal echogenicity. Portal vein is patent on color Doppler imaging with normal direction of blood flow towards the liver. Other: None. IMPRESSION: Normal examination Electronically Signed   By: Fidela Salisbury MD   On: 07/08/2020 02:48   IR ANGIO INTRA EXTRACRAN SEL COM CAROTID INNOMINATE UNI L MOD SED  Result Date: 07/02/2020 INDICATION: New onset right gaze deviation, left hemiplegia, and left-sided neglect. EXAM: 1. EMERGENT LARGE VESSEL OCCLUSION THROMBOLYSIS anterior CIRCULATION) COMPARISON:  CT angiogram of the head and neck of June 29, 2020. MEDICATIONS: Ancef 2 g IV was administered within 1 hour of the procedure. ANESTHESIA/SEDATION: General anesthesia CONTRAST:  Isovue 300 approximately 165 mL FLUOROSCOPY TIME:  Fluoroscopy Time: 97 minutes 0 seconds (3622 mGy). COMPLICATIONS: None immediate. TECHNIQUE: Following a full explanation of the procedure along with the potential associated complications, an informed witnessed consent was obtained from the spouse. The risks of intracranial hemorrhage of 10%, worsening neurological deficit, ventilator dependency, death and inability to revascularize were all  reviewed in detail with the patient's spouse. The patient was then put under general anesthesia by the Department of Anesthesiology at [facility] The right groin was prepped and draped in the usual sterile fashion. Thereafter using modified Seldinger technique, transfemoral access into the right common femoral artery was obtained without difficulty. Over a 0.035 inch guidewire an 8 French 25 Pinnacle sheath was inserted. Through this, and also over a 0.035 inch guidewire a 5 French 125 cm Berenstein select inside of the balloon guide catheter combinationwas advanced to the aortic arch region and selectively positioned in the distal right common carotid artery. Guidewire, and the select catheter removed. Good aspiration obtained from the hub of the balloon guide catheter in the right common carotid artery. Arteriogram was then performed centered extra cranially and intracranially. FINDINGS: The right common carotid arteriogram demonstrates the right external carotid artery and its major branches to be widely patent. The right internal carotid artery at the bulb demonstrates a moderate sized plaque associated with complete occlusion just distal to the bulb. No evidence of distal angiographic string sign, or reconstitution of right internal carotid artery in the cavernous segment was noted. PROCEDURE: Through the balloon guide catheter, a combination of an 021 Trevo ProVue microcatheter over a 0.014 inch standard Synchro micro guidewire with a J configuration, access through the occluded right internal carotid artery was obtained with the micro guidewire leading followed by the microcatheter. The combination was advanced to the  horizontal petrous segment where the micro guidewire was removed. Poor aspiration was obtained from the microcatheter hub on account of the extensive clot in the entire right internal carotid artery intracranially and extra cranially. The microcatheter was then exchanged for a 014 inch  Synchro standard 300 cm exchange micro guidewire with a J-tip configuration. A control arteriogram performed through the balloon guide demonstrated minimally improved caliber through the proximal right internal carotid artery. A 4 mm x 30 mm Viatrac 14 angioplasty balloon catheter which had been prepped with 50% contrast and 50% heparinized saline infusion and integrated with 75% contrast and 25% heparinized saline infusion was advanced over the exchange micro guidewire using the rapid exchange technique. The proximal and distal markers were then positioned in the described landing zones. A control inflation was then performed using micro inflation syringe device via micro tubing to 12 atmospheres. This was maintained for about 20 seconds and retrieved proximally. A control arteriogram performed through the balloon guide demonstrated improved caliber in the bulb but there continued be a high-grade stenosis in the proximal right internal carotid artery. This prompted a second angioplasty in the manner as described above again to 12 atmospheres for approximately 20 seconds. The balloon was then deflated and retrieved and removed. A control arteriogram performed through the balloon guide demonstrated significantly improved caliber and flow through the angioplastied segment with improved flow to the more distal right internal carotid artery. However, there continued to be extensive clot noted within the internal carotid artery at the cranial skull base and the supraclinoid right ICA. An 021 Trevo ProVue catheter inside of a 071 134 cm aspiration catheter, combination was advanced over the exchange micro guidewire to the cavernous segment of the right internal carotid artery. Exchange micro guidewire was replaced with a regular 014 inch standard Synchro micro guidewire with a J configuration. This was then advanced using a torque device through the occluded right middle cerebral artery into one of the inferior division  branches in the M2 M3 region followed by the microcatheter. The guidewire was removed. Good aspiration obtained from the hub of the microcatheter. A gentle control arteriogram performed through this demonstrated slow flow through the vessel itself. A 4 mm x 40 mm Solitaire X retrieval device was then advanced to the distal end of the microcatheter. The retriever was deployed in the usual manner. The Zoom aspiration catheter was advanced to the mid right M1 segment. Continuous aspiration was then performed at the hub of the aspiration catheter for 2-1/2 minutes, with proximal flow arrest in the right internal carotid artery. The retrieval device, the microcatheter and the aspiration catheter were retrieved and removed. Copious amounts of clot were captured into the [place], and also in the retrieval device. Following reversal of flow arrest, a control arteriogram performed through the balloon guide catheter demonstrated now improved flow through the proximal right internal carotid artery extra cranially and intracranially. There is now patency of the right anterior cerebral artery with a stump of the occluded right middle cerebral artery. A second pass was then made again using the above combination. The microcatheter was now accessed into the superior division of the right middle cerebral artery M2 M3 region over a 0.014 inch standard Synchro micro guidewire. Free aspiration of a blood was obtained at the hub of the microcatheter. A Tiger 21 retrieval device was then advanced to the distal end of the retrieval device. This was then deployed by retrieving the microcatheter. The proximal portion of the retrieval device was  within the proximal aspect of the occluded right middle cerebral artery. Free aspiration was then started through the aspiration catheter with the Penumbra aspiration device which was positioned in the distal portion of the occluded clot. The retrieval device was then opened and closed until  there was kinking at the proximal portion. The microcatheter was then advanced to the proximal marker on the retrieval device. With constant aspiration continued, the Tiger 21 retrieval device was then removed as constant aspiration was applied at the hub of the aspiration catheter, and also with proximal flow arrest and aspiration at the hub of the balloon guide with a 60 mL syringe. Multiple fragments of clot were obtained within the [place], and also entangled in the retrieval device. With reversal of the proximal flow arrest, a control arteriogram performed through the balloon guide in the right internal carotid artery now demonstrated improved opacification of the right middle cerebral artery to where there was now opacification of the anterior temporal branch. Distal branch remains occluded with no change in anterior cerebral artery. A third pass was now made using an 027 150 cm Marksman microcatheter which was advanced again using the combination of the 071 Zoom aspiration catheter over a 0.014 inch standard Synchro micro guidewire. This combination was advanced into the now the inferior division with the microcatheter advanced into the M2 region of the inferior division. The micro guidewire was removed. Good aspiration obtained from the hub of the 027 microcatheter. A gentle control arteriogram performed through this now demonstrated free flow into the distal vasculature. The aspiration catheter was engaged into the occluded distal right middle cerebral artery at the origin of the inferior division. Aspiration was now at the hub of the aspiration catheter embedded in the right middle cerebral artery clot and also at the hub of the Marksman catheter for approximately 2 minutes, with proximal flow arrest in the right internal carotid artery and constant aspiration at its hub. The Marksman catheter was then gently retrieved while maintaining aspiration through the Zoom aspiration catheter. The Zoom  aspiration catheter remained loft to aspiration. This was then retrieved and removed. Again clots were seen in the aspirate. Control arteriogram performed following reversal of flow arrest in the right internal carotid artery now demonstrated complete revascularization of the right MCA distribution, into the distal distribution achieving a TICI 2c revascularization. The right anterior cerebral artery remained widely patent. Measurements were now performed of the right internal carotid artery proximally, and the right common carotid artery distally at the proposed landing zones for the revascularization stent. An 021 Trevo ProVue microcatheter was advanced to the petrous segment of the right internal carotid artery over a 0.014 inch standard Synchro micro guidewire, and replaced with an 014 inch 300 cm Synchro exchange micro guidewire. Safe position of the tip of the exchange micro guidewire was verified. It was elected to proceed with placement of a 6/8 mm x 40 mm Xact stent across the diseased previously occluded right internal carotid artery proximally. This was retrogradely purged with heparinized saline infusion. Using the rapid exchange technique, the delivery system of the stent was then advanced and positioned covering distally, and also the proximal portion of the landing zones. Stent was deployed in the usual manner without any difficulty. The delivery system was removed. Good aspiration obtained from the hub of the balloon guide in the right common carotid artery. A control arteriogram performed through this demonstrated excellent positioning and apposition of the stent with now free flow noted into the intracranial  right ICA and the right middle and right anterior cerebral arteries being widely patent. Prior to this patient was loaded with 81 mg aspirin, and 180 mg of Brilinta via an orogastric tube. CT scan of the brain was then performed which demonstrated contrast blush of the right basal ganglia, and  also to the 3 focal areas of hyperdensity suspicious for micro hemorrhages. 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 Three quarter bolus dose of Cangrelor was then given intravenously in order to salvage the occluding stent in the right internal carotid artery. The balloon guide was then removed following removal of the exchange micro guidewire. The diagnostic catheter was then positioned in the left common carotid artery. This demonstrated the left external carotid artery and the left internal carotid arteries to be widely patent. Patency of the left internal carotid to the cranial skull base was verified. Also the left middle and the left anterior cerebral arteries were seen to opacify into the capillary and venous phases. Also almost simultaneously cross-filling via the anterior communicating artery of the right anterior and the right middle cerebral arteries is noted via the anterior communicating artery. No gross filling defects were seen in the middle cerebral artery distribution. 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 The diagnostic catheter was removed. The 8 French Pinnacle sheath was then exchanged for a short 8 [place] sheath which in turn was then removed with the successful application of the 7 [place] ExoSeal closure device with hemostasis in the right groin. Distal pulses remained palpable in the feet bilaterally unchanged. A second CT scan of the brain demonstrated no significant change in the right basal ganglia with continued presence of at least 3 foci of hemorrhage. No mass effect or midline shift was seen. Patient was left intubated on account of patient's medical condition and  inability to respond appropriately to protect the airway. Patient was then transferred to the neuro ICU for post recanalization management. IMPRESSION: Status post endovascular complete revascularization of the right middle cerebral artery and the right anterior cerebral artery with 1 pass with a Solitaire 4 mm x 40 mm X retrieval device and aspiration, and 1 pass with the Tiger 21 retrieval device with proximal aspiration, and with 1 pass with contact aspiration achieving a TICI 2c revascularization of the right middle cerebral artery distribution. 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 Attempt to rescue with 3/4 bolus dose of Cangrelor IV. PLAN: Follow-up in the clinic 4 to 6 weeks post discharge. Electronically Signed   By: Luanne Bras M.D.   On: 06/30/2020 13:20      Discharge Exam: Vitals:   07/09/20 0405 07/09/20 0700  BP: 126/61 124/69  Pulse: (!) 54 (!) 48  Resp: 15 20  Temp: 98.1 F (36.7 C) 97.7 F (36.5 C)  SpO2: 98% 98%   Vitals:   07/08/20 2004 07/08/20 2343 07/09/20 0405 07/09/20 0700  BP: 121/67 (!) 121/57 126/61 124/69  Pulse: (!) 59 (!) 49 (!) 54 (!) 48  Resp: 17 18 15 20   Temp: 98 F (36.7 C) 98.2 F (36.8 C) 98.1 F (36.7 C) 97.7 F (36.5 C)  TempSrc: Oral Oral Oral Oral  SpO2: 98% 98% 98% 98%  Weight:      Height:        General: Pt is alert, awake, not in acute distress Cardiovascular: RRR, S1/S2 +, no rubs, no gallops Respiratory: CTA bilaterally, no wheezing, no rhonchi Abdominal: Soft, NT, ND, bowel sounds + Extremities: no edema, no cyanosis Neuro: Alert and oriented.  Speech with some dysarthria.  Left facial droop.  3/5 power in left upper and lower extremities.  5/5 power in right upper and lower extremity.    The results of significant diagnostics from this hospitalization  (including imaging, microbiology, ancillary and laboratory) are listed below for reference.     Microbiology: Recent Results (from the past 240 hour(s))  Respiratory Panel by RT PCR (Flu A&B, Covid) -     Status: None   Collection Time: 06/29/20 10:01 AM  Result Value Ref Range Status   SARS Coronavirus 2 by RT PCR NEGATIVE NEGATIVE Final    Comment: (NOTE) SARS-CoV-2 target nucleic acids are NOT DETECTED.  The SARS-CoV-2 RNA is generally detectable in upper respiratoy specimens during the acute phase of infection. The lowest concentration of SARS-CoV-2 viral copies this assay can detect is 131 copies/mL. A negative result does not preclude SARS-Cov-2 infection and should not be used as the sole basis for treatment or other patient management decisions. A negative result may occur with  improper specimen collection/handling, submission of specimen other than nasopharyngeal swab, presence of viral mutation(s) within the areas targeted by this assay, and inadequate number of viral copies (<131 copies/mL). A negative result must be combined with clinical observations, patient history, and epidemiological information. The expected result is Negative.  Fact Sheet for Patients:  PinkCheek.be  Fact Sheet for Healthcare Providers:  GravelBags.it  This test is no t yet approved or cleared by the [place] FDA and  has been authorized for detection and/or diagnosis of SARS-CoV-2 by FDA under an Emergency Use Authorization (EUA). This EUA will remain  in effect (meaning this test can be used) for the duration of the COVID-19 declaration under Section 564(b)(1) of the Act, 21 U.S.C. section 360bbb-3(b)(1), unless the authorization is terminated or revoked sooner.     Influenza A by PCR NEGATIVE NEGATIVE Final   Influenza B by PCR NEGATIVE NEGATIVE Final    Comment: (NOTE) The Xpert Xpress SARS-CoV-2/FLU/RSV assay is intended  as an aid in  the diagnosis of influenza from Nasopharyngeal swab specimens and  should not be used as a sole basis for treatment. Nasal washings and  aspirates are unacceptable for Xpert Xpress SARS-CoV-2/FLU/RSV  testing.  Fact Sheet for Patients: PinkCheek.be  Fact Sheet for Healthcare Providers: GravelBags.it  This test is not yet approved or cleared by the [place] FDA and  has been authorized for detection and/or diagnosis of SARS-CoV-2 by  FDA under an Emergency Use Authorization (EUA). This EUA will remain  in effect (meaning this test can be used) for the duration of the  Covid-19 declaration under Section 564(b)(1) of the Act, 21  U.S.C. section 360bbb-3(b)(1), unless the authorization is  terminated or revoked. Performed at [facility], [place] [address], [place], [place] [address]   MRSA PCR Screening     Status: None   Collection Time: 06/29/20  5:28 PM   Specimen: Nasal Mucosa; Nasopharyngeal  Result Value Ref Range Status   MRSA by PCR NEGATIVE NEGATIVE Final    Comment:        The GeneXpert MRSA Assay (FDA approved for NASAL  specimens only), is one component of a comprehensive MRSA colonization surveillance program. It is not intended to diagnose MRSA infection nor to guide or monitor treatment for MRSA infections. Performed at [facility], [place] [address], [place], [place] [address]   Urine Culture     Status: Abnormal   Collection Time: 07/06/20  1:26 PM   Specimen: Urine, Random  Result Value Ref Range Status   Specimen Description URINE, RANDOM  Final   Special Requests   Final    NONE Performed at [facility], [place] [address] [place], [place] [address]    Culture >=100,000 COLONIES/mL ENTEROBACTER SPECIES (A)  Final   Report Status 07/08/2020 FINAL  Final   Organism ID, Bacteria ENTEROBACTER SPECIES (A)  Final      Susceptibility   Enterobacter species - MIC*     CEFAZOLIN >=64 RESISTANT Resistant     CEFTRIAXONE <=0.25 SENSITIVE Sensitive     CIPROFLOXACIN <=0.25 SENSITIVE Sensitive     GENTAMICIN <=1 SENSITIVE Sensitive     IMIPENEM 2 SENSITIVE Sensitive     NITROFURANTOIN 32 SENSITIVE Sensitive     TRIMETH/SULFA <=20 SENSITIVE Sensitive     PIP/TAZO <=4 SENSITIVE Sensitive     * >=100,000 COLONIES/mL ENTEROBACTER SPECIES     Labs: BNP (last 3 results) No results for input(s): BNP in the last 8760 hours. Basic Metabolic Panel: Recent Labs  Lab 07/05/20 0314 07/06/20 0417 07/07/20 0950 07/08/20 0147 07/08/20 2231 07/09/20 0626  NA 137 137 137 138  --  138  K 3.3* 3.5 3.6 3.7  --  3.3*  CL 106 106 108 107  --  107  CO2 21* 19* 22 23  --  24  GLUCOSE 93 95 120* 109*  --  104*  BUN 14 15 11 10   --  9  CREATININE 0.89 0.76 0.69 0.74  --  0.64  CALCIUM 8.0* 8.1* 8.0* 8.0*  --  8.0*  MG  --   --  2.1 2.0 2.0 1.9  PHOS  --   --  2.5 2.9 2.2* 2.7   Liver Function Tests: Recent Labs  Lab 07/07/20 0950 07/08/20 0147  AST 105* 97*  ALT 79* 81*  ALKPHOS 134* 144*  BILITOT 0.7 0.6  PROT 5.2* 5.6*  ALBUMIN 1.8* 1.9*   No results for input(s): LIPASE, AMYLASE in the last 168 hours. No results for input(s): AMMONIA in the last 168 hours. CBC: Recent Labs  Lab 07/05/20 0314 07/06/20 0417 07/07/20 0950 07/08/20 0147 07/09/20 0626  WBC 7.8 10.6* 8.0 7.8 7.3  NEUTROABS 6.0  --  6.3 5.6 5.1  HGB 9.8* 9.9* 9.4* 9.8* 10.2*  HCT 29.1* 29.3* 27.9* 29.1* 29.8*  MCV 97.0 98.0 96.2 96.4 95.8  PLT 365 406* 460* 472* 586*   Cardiac Enzymes: No results for input(s): CKTOTAL, CKMB, CKMBINDEX, TROPONINI in the last 168 hours. BNP: Invalid input(s): POCBNP CBG: No results for input(s): GLUCAP in the last 168 hours. D-Dimer No results for input(s): DDIMER in the last 72 hours. Hgb A1c No results for input(s): HGBA1C in the last 72 hours. Lipid Profile No results for input(s): CHOL, HDL, LDLCALC, TRIG, CHOLHDL, LDLDIRECT in the last 72  hours. Thyroid function studies No results for input(s): TSH, T4TOTAL, T3FREE, THYROIDAB in the last 72 hours.  Invalid input(s): FREET3 Anemia work up No results for input(s): VITAMINB12, FOLATE, FERRITIN, TIBC, IRON, RETICCTPCT in the last 72 hours. Urinalysis    Component Value Date/Time   COLORURINE YELLOW  07/06/2020 0923   APPEARANCEUR HAZY (A) 07/06/2020 0923   LABSPEC 1.023 07/06/2020 0923   PHURINE 5.0 07/06/2020 0923   GLUCOSEU NEGATIVE 07/06/2020 0923   HGBUR MODERATE (A) 07/06/2020 0923   BILIRUBINUR NEGATIVE 07/06/2020 0923   KETONESUR 20 (A) 07/06/2020 0923   PROTEINUR NEGATIVE 07/06/2020 0923   NITRITE POSITIVE (A) 07/06/2020 0923   LEUKOCYTESUR LARGE (A) 07/06/2020 0923   Sepsis Labs Invalid input(s): PROCALCITONIN,  WBC,  LACTICIDVEN Microbiology Recent Results (from the past 240 hour(s))  Respiratory Panel by RT PCR (Flu A&B, Covid) -     Status: None   Collection Time: 06/29/20 10:01 AM  Result Value Ref Range Status   SARS Coronavirus 2 by RT PCR NEGATIVE NEGATIVE Final    Comment: (NOTE) SARS-CoV-2 target nucleic acids are NOT DETECTED.  The SARS-CoV-2 RNA is generally detectable in upper respiratoy specimens during the acute phase of infection. The lowest concentration of SARS-CoV-2 viral copies this assay can detect is 131 copies/mL. A negative result does not preclude SARS-Cov-2 infection and should not be used as the sole basis for treatment or other patient management decisions. A negative result may occur with  improper specimen collection/handling, submission of specimen other than nasopharyngeal swab, presence of viral mutation(s) within the areas targeted by this assay, and inadequate number of viral copies (<131 copies/mL). A negative result must be combined with clinical observations, patient history, and epidemiological information. The expected result is Negative.  Fact Sheet for Patients:   PinkCheek.be  Fact Sheet for Healthcare Providers:  GravelBags.it  This test is no t yet approved or cleared by the [place] FDA and  has been authorized for detection and/or diagnosis of SARS-CoV-2 by FDA under an Emergency Use Authorization (EUA). This EUA will remain  in effect (meaning this test can be used) for the duration of the COVID-19 declaration under Section 564(b)(1) of the Act, 21 U.S.C. section 360bbb-3(b)(1), unless the authorization is terminated or revoked sooner.     Influenza A by PCR NEGATIVE NEGATIVE Final   Influenza B by PCR NEGATIVE NEGATIVE Final    Comment: (NOTE) The Xpert Xpress SARS-CoV-2/FLU/RSV assay is intended as an aid in  the diagnosis of influenza from Nasopharyngeal swab specimens and  should not be used as a sole basis for treatment. Nasal washings and  aspirates are unacceptable for Xpert Xpress SARS-CoV-2/FLU/RSV  testing.  Fact Sheet for Patients: PinkCheek.be  Fact Sheet for Healthcare Providers: GravelBags.it  This test is not yet approved or cleared by the [place] FDA and  has been authorized for detection and/or diagnosis of SARS-CoV-2 by  FDA under an Emergency Use Authorization (EUA). This EUA will remain  in effect (meaning this test can be used) for the duration of the  Covid-19 declaration under Section 564(b)(1) of the Act, 21  U.S.C. section 360bbb-3(b)(1), unless the authorization is  terminated or revoked. Performed at [facility], [place] [address] [place], [place] [address]   MRSA PCR Screening     Status: None   Collection Time: 06/29/20  5:28 PM   Specimen: Nasal Mucosa; Nasopharyngeal  Result Value Ref Range Status   MRSA by PCR NEGATIVE NEGATIVE Final    Comment:        The GeneXpert MRSA Assay (FDA approved for NASAL specimens only), is one component of a comprehensive MRSA  colonization surveillance program. It is not intended to diagnose MRSA infection nor to guide or monitor treatment for MRSA infections. Performed at [facility],  [address] [place], [place] [address]   Urine Culture     Status: Abnormal   Collection Time: 07/06/20  1:26 PM   Specimen: Urine, Random  Result Value Ref Range Status   Specimen Description URINE, RANDOM  Final   Special Requests   Final    NONE Performed at [facility], [place] [address] [place], [place] [address]    Culture >=100,000 COLONIES/mL ENTEROBACTER SPECIES (A)  Final   Report Status 07/08/2020 FINAL  Final   Organism ID, Bacteria ENTEROBACTER SPECIES (A)  Final      Susceptibility   Enterobacter species - MIC*    CEFAZOLIN >=64 RESISTANT Resistant     CEFTRIAXONE <=0.25 SENSITIVE Sensitive     CIPROFLOXACIN <=0.25 SENSITIVE Sensitive     GENTAMICIN <=1 SENSITIVE Sensitive     IMIPENEM 2 SENSITIVE Sensitive     NITROFURANTOIN 32 SENSITIVE Sensitive     TRIMETH/SULFA <=20 SENSITIVE Sensitive     PIP/TAZO <=4 SENSITIVE Sensitive     * >=100,000 COLONIES/mL ENTEROBACTER SPECIES     Time coordinating discharge: Over 30 minutes  SIGNED:   Darliss Cheney, MD  [facility] 07/09/2020, 9:20 AM  If 7PM-7AM, please contact night-coverage [email or web address]

## 2020-07-09 NOTE — Progress Notes (Signed)
Retta Diones, RN  Rehab Admission Coordinator  Physical Medicine and Rehabilitation  PMR Pre-admission     Signed  Date of Service:  07/07/2020 2:43 PM      Related encounter: ED to Hosp-Admission (Discharged) from 06/29/2020 in [facility]      Signed       Show:Clear all [x] Manual[x] Template[x] Copied  Added by: [x] Cristina Gong, RN[x] Retta Diones, RN  [] Hover for details PMR Admission Coordinator Pre-Admission Assessment  Patient: Tyler Richards is an 69 y.o., male MRN: [id number] DOB: 09/24/1951 Height: 6\' 2"  (188 cm) Weight: 85.1 kg                                                                                                                                                  Insurance Information HMO:     PPO:      PCP:      IPA:      80/20:      OTHER:  PRIMARY: Medicare a and b      Policy#: [id number]      Subscriber: pt Benefits:  Phone #: passport one source online     Name: 07/07/2020 Eff. Date: 03/27/2015     Deduct: $1484      Out of Pocket Max: none      Life Max: none  CIR: 100%      SNF: 20 full days Outpatient: 80%     Co-Pay: [place]: 1005      Co-Pay: none DME: 80%     Co-Pay: 20% Providers: pt choice  SECONDARY: none  Financial Counselor:       Phone#:   The Therapist, art Information Summary" for patients in Inpatient Rehabilitation Facilities with attached "Privacy Act [place] Records" was provided and verbally reviewed with: Patient and Family  Emergency Contact Information         Contact Information    Name Relation Home Work Mobile   [place], [place] Spouse   [phone]     Current Medical History  Patient Admitting Diagnosis: CVA  History of Present Illness: a 69 year old right-handed male with unremarkable past medical history no prescription medications. Per chart review lives with spouse independent and active prior to admission. 1 level home 2 steps to entry.  Presented 06/29/2020 with acute onset of left-sided weakness and slurred speech. Cranial CT scan showed hyperdense distal right ICA and proximal MCA. Blunted appearance of the posterior right putamen. Chronic right high frontal cortex infarct. Patient did not receive TPA. CT angiogram of head and neck emergent large vessel occlusion with no flow seen in the right internal carotid artery or proximal MCA. Patient underwent right MCA thrombectomy and right ICA stent placement 06/29/2020 per interventional radiology. Most recent MRI and imaging revealed acute infarct right basal ganglia with nonprogressive hemorrhage when correlated with a prior  CT of 06/29/2020. Lower extremity Dopplers no signs of DVT. Carotid Dopplers no ICA stenosis. Echocardiogram with ejection fraction of 50 to 70% grade 1 diastolic dysfunction. Admission chemistries unremarkable aside from glucose 104 urine drug screen negative. Patient did receive cardiology consult for bradycardia consistent with hyper vagotonia and currently maintained on ProAmatine as well as Florinef. EKG normal sinus rhythm 70s to 80s occasional sinus bradycardia. Patient was initially maintained on aspirin as well as Brilinta. Patient was extubated 06/30/2020.Urine culture greater 100,000 Enterobacter placed on Maxipimechanged to Bactrim. Gastroenterology services consulted due to some bright red blood with bowel movement currently holding off on any endoscopic evaluation monitor hemoglobin hematocrit with latest hemoglobin 9.8and no further episodes reported. His Brilinta has been placed on hold after rectal outlet bleeding and continues only on low-dose aspirin at the recommendations of neurology services. Currently on a dysphagia #1 thin liquid diet. Therapy evaluations completed and patient to be admitted for a comprehensive inpatient rehab program.  Complete NIHSS TOTAL: 10 Glasgow Coma Scale Score: 15  Past Medical History  History  reviewed. No pertinent past medical history.  Family History  family history is not on file.  Prior Rehab/Hospitalizations:  Has the patient had prior rehab or hospitalizations prior to admission? Yes  Has the patient had major surgery during 100 days prior to admission? Yes  Current Medications   Current Facility-Administered Medications:  .   stroke: mapping our early stages of recovery book, , Does not apply, Once, Biby, Sharon L, NP .  0.9 %  sodium chloride infusion, 250 mL, Intravenous, Continuous, Biby, Sharon L, NP, Last Rate: 75 mL/hr at 07/08/20 1419, 250 mL at 07/08/20 1419 .  acetaminophen (TYLENOL) tablet 650 mg, 650 mg, Oral, Q4H PRN, 650 mg at 07/07/20 2030 **OR** acetaminophen (TYLENOL) 160 MG/5ML solution 650 mg, 650 mg, Per Tube, Q4H PRN **OR** acetaminophen (TYLENOL) suppository 650 mg, 650 mg, Rectal, Q4H PRN, Burnetta Sabin L, NP, 650 mg at 07/01/20 0515 .  aspirin chewable tablet 81 mg, 81 mg, Oral, Daily, 81 mg at 07/08/20 1111 **OR** aspirin chewable tablet 81 mg, 81 mg, Per Tube, Daily, Biby, Sharon L, NP, 81 mg at 07/07/20 0843 .  atorvastatin (LIPITOR) tablet 40 mg, 40 mg, Oral, Daily, Biby, Sharon L, NP, 40 mg at 07/08/20 1111 .  chlorhexidine (PERIDEX) 0.12 % solution 15 mL, 15 mL, Mouth Rinse, BID, Biby, Sharon L, NP, 15 mL at 07/07/20 2122 .  Chlorhexidine Gluconate Cloth 2 % PADS 6 each, 6 each, Topical, Daily, Burnetta Sabin L, NP, 6 each at 07/08/20 1113 .  [COMPLETED] colchicine tablet 1.2 mg, 1.2 mg, Oral, Once, 1.2 mg at 07/06/20 1205 **FOLLOWED BY** colchicine tablet 0.6 mg, 0.6 mg, Oral, BID, Biby, Sharon L, NP, 0.6 mg at 07/08/20 1112 .  docusate (COLACE) 50 MG/5ML liquid 100 mg, 100 mg, Oral, BID, Biby, Sharon L, NP, 100 mg at 07/07/20 2122 .  feeding supplement (ENSURE ENLIVE) (ENSURE ENLIVE) liquid 237 mL, 237 mL, Oral, TID BM, Pahwani, Ravi, MD .  fludrocortisone (FLORINEF) tablet 0.1 mg, 0.1 mg, Oral, Daily, Biby, Sharon L, NP, 0.1 mg at 07/08/20  1111 .  influenza vaccine adjuvanted (FLUAD) injection 0.5 mL, 0.5 mL, Intramuscular, Tomorrow-1000, Chand, Sudham, MD .  MEDLINE mouth rinse, 15 mL, Mouth Rinse, q12n4p, Biby, Sharon L, NP, 15 mL at 07/08/20 1114 .  midodrine (PROAMATINE) tablet 5 mg, 5 mg, Oral, Q8H, Biby, Sharon L, NP, 5 mg at 07/08/20 1414 .  pantoprazole (PROTONIX) EC tablet 40 mg, 40  mg, Oral, Daily, Biby, Sharon L, NP, 40 mg at 07/08/20 1111 .  pneumococcal 23 valent vaccine (PNEUMOVAX-23) injection 0.5 mL, 0.5 mL, Intramuscular, Tomorrow-1000, Chand, Sudham, MD .  polyethylene glycol (MIRALAX / GLYCOLAX) packet 17 g, 17 g, Oral, Daily, Biby, Sharon L, NP, 17 g at 07/08/20 1110 .  senna-docusate (Senokot-S) tablet 1 tablet, 1 tablet, Oral, QHS PRN, Miachel Roux, Sharon L, NP .  sulfamethoxazole-trimethoprim (BACTRIM DS) 800-160 MG per tablet 1 tablet, 1 tablet, Oral, Q12H, Darliss Cheney, MD  Patients Current Diet:     Diet Order                  DIET - DYS 1 Room service appropriate? Yes with Assist; Fluid consistency: Thin  Diet effective now                  Precautions / Restrictions Precautions Precautions: Fall Precaution Comments: monitor HR and BP - watch for bradycardia, condom catheter Restrictions Weight Bearing Restrictions: No Other Position/Activity Restrictions: n/a   Has the patient had 2 or more falls or a fall with injury in the past year?No  Prior Activity Level Community (5-7x/wk): Independent and active  Prior Functional Level Prior Function Level of Independence: Independent Comments: Independent for ADLs, walking. Drives.  Self Care: Did the patient need help bathing, dressing, using the toilet or eating?  Independent  Indoor Mobility: Did the patient need assistance with walking from room to room (with or without device)? Independent  Stairs: Did the patient need assistance with internal or external stairs (with or without device)? Independent  Functional Cognition:  Did the patient need help planning regular tasks such as shopping or remembering to take medications? [place] / Equipment Home Assistive Devices/Equipment: Grab bars around toilet, Grab bars in shower, Wheelchair, Environmental consultant (specify type), Eyeglasses Home Equipment: Grab bars - tub/shower, Cane - single point, Environmental consultant - 2 wheels, Wheelchair - manual  Prior Device Use: Indicate devices/aids used by the patient prior to current illness, exacerbation or injury? None of the above  Current Functional Level Cognition  Arousal/Alertness: Awake/alert Overall Cognitive Status: Impaired/Different from baseline Current Attention Level: Sustained Orientation Level: Oriented X4 Following Commands: Follows one step commands consistently, Follows one step commands with increased time, Follows multi-step commands inconsistently Safety/Judgement: Decreased awareness of safety, Decreased awareness of deficits General Comments: Requires extra time and one step cues repeated to follow directions. Pt anxious with standing due to fear of falling. Attention: Sustained Sustained Attention: Appears intact (during brief intervals of time) Memory: Impaired Memory Impairment: Retrieval deficit, Decreased short term memory Decreased Short Term Memory: Verbal basic (recalled 2/4 words independently; 4/4 with cues) Awareness: Impaired Awareness Impairment: Emergent impairment Problem Solving: Impaired Problem Solving Impairment: Functional basic, Functional complex Executive Function: Self Monitoring, Self Correcting Self Monitoring: Impaired Self Monitoring Impairment: Functional basic Self Correcting: Impaired Self Correcting Impairment: Functional basic Safety/Judgment: Impaired Comments: L inattention    Extremity Assessment (includes Sensation/Coordination)  Upper Extremity Assessment: LUE deficits/detail LUE Deficits / Details: trace activation noted with elbow flexion and  extension today LUE Sensation: decreased light touch LUE Coordination: decreased fine motor, decreased gross motor  Lower Extremity Assessment: Defer to PT evaluation LLE Deficits / Details: Grossly ~2-/5 hip flexion, 2+/5 knee extension, 2+/5 PF, 0/5 DF. LLE Sensation: decreased light touch, decreased proprioception LLE Coordination: decreased fine motor, decreased gross motor    ADLs  Overall ADL's : Needs assistance/impaired Eating/Feeding: NPO Grooming: Brushing hair, Minimal assistance, Sitting Grooming Details (indicate cue  type and reason): requires assist for sitting balance without UE support  Upper Body Bathing: Moderate assistance, Sitting Lower Body Bathing: Maximal assistance, +2 for safety/equipment, +2 for physical assistance, Sitting/lateral leans, Sit to/from stand Upper Body Dressing : Moderate assistance, Sitting Lower Body Dressing: Total assistance, +2 for physical assistance, +2 for safety/equipment, Bed level Toileting- Clothing Manipulation and Hygiene: Total assistance, +2 for physical assistance, +2 for safety/equipment, Bed level Functional mobility during ADLs: Maximal assistance, +2 for physical assistance, +2 for safety/equipment General ADL Comments: pt seen for splint check approx 4hrs after splint re-donned post therapy session. pt with some slight indentation noted at lateral aspect of thumb which improved with time. pt without redness to medial aspect of thumb today. he denies pain/discomfort     Mobility  Overal bed mobility: Needs Assistance Bed Mobility: Supine to Sit, Sit to Supine Rolling: Mod assist, +2 for safety/equipment Sidelying to sit: +2 for physical assistance, +2 for safety/equipment, Mod assist Supine to sit: Mod assist, HOB elevated Sit to supine: Max assist General bed mobility comments: Cued pt to bring LEs to EOB and R hand to L bed rail to transition supine > sit EOB, modA to manage trunk and minA to manage LEs. Sit > supine with  maxA, cuing pt to descend onto L elbow and assistance to manage trunk and LEs.    Transfers  Overall transfer level: Needs assistance Equipment used: None Transfer via Lift Equipment: Stedy Transfers: Sit to/from Stand Sit to Stand: Max assist, From elevated surface Stand pivot transfers: Total assist General transfer comment: Performed 5 mini-STS from raised EOB without rest and B knee block and TCs and assistance under buttocks and pt cued to lean chest on therapist's shoulder, maxA. Improved buttocks clearance noted with each subsequent rep.     Ambulation / Gait / Stairs / Wheelchair Mobility  Ambulation/Gait General Gait Details: unable    Posture / Balance Dynamic Sitting Balance Sitting balance - Comments: Initially min guard for static sitting EOB with UE support, but as pt fatigued he required min-modA intermittently and cues to lean posteriorly and find midline. Balance Overall balance assessment: Needs assistance Sitting-balance support: Feet supported, Bilateral upper extremity supported Sitting balance-Leahy Scale: Poor Sitting balance - Comments: Initially min guard for static sitting EOB with UE support, but as pt fatigued he required min-modA intermittently and cues to lean posteriorly and find midline. Postural control: Left lateral lean Standing balance support: Single extremity supported Standing balance-Leahy Scale: Zero Standing balance comment: MaxA for momentary static partial standing on elevated EOB, with hands on therapist or bed, not coming to full stand, maxA and B knee block.    Special needs/care consideration     Previous Home Environment  Living Arrangements: Spouse/significant other  Lives With: Spouse Available Help at Discharge: Family, Available 24 hours/day Type of Home: Mobile home Home Layout: One level Home Access: Stairs to enter Entrance Stairs-Rails: Right Entrance Stairs-Number of Steps: 2 Bathroom Shower/Tub: Research officer, political party: Yes How Accessible: Accessible via walker [place]: No  Discharge Living Setting Plans for Discharge Living Setting: Patient's home, Mobile Home Type of Home at Discharge: Mobile home Discharge Home Layout: One level Discharge Home Access: Stairs to enter Entrance Stairs-Rails: Right Entrance Stairs-Number of Steps: 2 Discharge Bathroom Shower/Tub: Tub/shower unit Discharge Bathroom Toilet: Standard Discharge Bathroom Accessibility: Yes How Accessible: Accessible via walker Does the patient have any problems obtaining your medications?: No  Social/Family/Support Systems Patient Roles: Spouse Contact Information: wife,  [name] Anticipated Caregiver: wife Anticipated Ambulance person Information: see above Caregiver Availability: 24/7 Discharge Plan Discussed with Primary Caregiver: Yes Is Caregiver In Agreement with Plan?: Yes Does Caregiver/Family have Issues with Lodging/Transportation while Pt is in Rehab?: No  Goals Patient/Family Goal for Rehab: min assist with PT, OT and SLP Expected length of stay: [facility]OS 2 to 3 weeks Pt/Family Agrees to Admission and willing to participate: Yes Program Orientation Provided & Reviewed with Pt/Caregiver Including Roles  & Responsibilities: Yes  Decrease burden of Care through IP rehab admission: n/a  Possible need for SNF placement upon discharge:not anticipated  Patient Condition: This patient's medical and functional status has changed since the consult dated: 07/01/2020 in which the Rehabilitation Physician determined and documented that the patient's condition is appropriate for intensive rehabilitative care in an inpatient rehabilitation facility. See "History of Present Illness" (above) for medical update. Functional changes are: Mod to max assist for mobility and ADLs. Patient's medical and functional status update has been discussed with the Rehabilitation  physician and patient remains appropriate for inpatient rehabilitation. Will admit to inpatient rehab today.  Preadmission Screen Completed By: Danne Baxter RN MSN with updates by Julious Payer, Audelia Acton, RN, 07/08/2020 5:06 PM ______________________________________________________________________   Discussed status with Dr. Ranell Patrick on 07/09/20 at 1000 and received approval for admission today.  Admission Coordinator: Danne Baxter RN MSN with updates by Julious Payer, Audelia Acton, time 1246/Date 07/09/20           Cosigned by: Izora Ribas, MD at 07/09/2020 12:52 PM  Revision History                Note Details  Author Retta Diones, RN File Time 07/09/2020 12:46 PM  Author Type Rehab Admission Coordinator Status Signed  Last Editor Retta Diones, RN Service Physical Medicine and [place] # [phone] Admit Date 07/09/2020

## 2020-07-09 NOTE — Progress Notes (Signed)
Telemetry reviewed SB/SR largely 50's, nocturnal rates to 40's transiently/intermittently I only saw 2 pauses, both about 4 seconds, one about 2045 last night and 0440 this AM No AV block  I reviewed with Dr. Quentin Ore.  No new recommendations from an EP perspective.  We will follow from [place].  Tommye Standard, PA-C

## 2020-07-10 ENCOUNTER — Inpatient Hospital Stay (HOSPITAL_COMMUNITY): Payer: Medicare Other | Admitting: Physical Therapy

## 2020-07-10 ENCOUNTER — Inpatient Hospital Stay (HOSPITAL_COMMUNITY): Payer: Medicare Other | Admitting: Speech Pathology

## 2020-07-10 ENCOUNTER — Inpatient Hospital Stay (HOSPITAL_COMMUNITY): Payer: Medicare Other | Admitting: Occupational Therapy

## 2020-07-10 DIAGNOSIS — I63511 Cerebral infarction due to unspecified occlusion or stenosis of right middle cerebral artery: Secondary | ICD-10-CM

## 2020-07-10 LAB — CBC WITH DIFFERENTIAL/PLATELET
Abs Immature Granulocytes: 0.03 10*3/uL (ref 0.00–0.07)
Basophils Absolute: 0.1 10*3/uL (ref 0.0–0.1)
Basophils Relative: 1 %
Eosinophils Absolute: 0.1 10*3/uL (ref 0.0–0.5)
Eosinophils Relative: 1 %
HCT: 27.6 % — ABNORMAL LOW (ref 39.0–52.0)
Hemoglobin: 9.2 g/dL — ABNORMAL LOW (ref 13.0–17.0)
Immature Granulocytes: 0 %
Lymphocytes Relative: 20 %
Lymphs Abs: 1.5 10*3/uL (ref 0.7–4.0)
MCH: 32.1 pg (ref 26.0–34.0)
MCHC: 33.3 g/dL (ref 30.0–36.0)
MCV: 96.2 fL (ref 80.0–100.0)
Monocytes Absolute: 1 10*3/uL (ref 0.1–1.0)
Monocytes Relative: 13 %
Neutro Abs: 4.9 10*3/uL (ref 1.7–7.7)
Neutrophils Relative %: 65 %
Platelets: 539 10*3/uL — ABNORMAL HIGH (ref 150–400)
RBC: 2.87 MIL/uL — ABNORMAL LOW (ref 4.22–5.81)
RDW: 12.3 % (ref 11.5–15.5)
WBC: 7.5 10*3/uL (ref 4.0–10.5)
nRBC: 0 % (ref 0.0–0.2)

## 2020-07-10 LAB — COMPREHENSIVE METABOLIC PANEL
ALT: 49 U/L — ABNORMAL HIGH (ref 0–44)
AST: 45 U/L — ABNORMAL HIGH (ref 15–41)
Albumin: 1.7 g/dL — ABNORMAL LOW (ref 3.5–5.0)
Alkaline Phosphatase: 114 U/L (ref 38–126)
Anion gap: 7 (ref 5–15)
BUN: 14 mg/dL (ref 8–23)
CO2: 24 mmol/L (ref 22–32)
Calcium: 8.1 mg/dL — ABNORMAL LOW (ref 8.9–10.3)
Chloride: 106 mmol/L (ref 98–111)
Creatinine, Ser: 0.76 mg/dL (ref 0.61–1.24)
GFR, Estimated: >60 mL/min (ref >60)
Glucose, Bld: 125 mg/dL — ABNORMAL HIGH (ref 70–99)
Potassium: 4 mmol/L (ref 3.5–5.1)
Sodium: 137 mmol/L (ref 135–145)
Total Bilirubin: 0.6 mg/dL (ref 0.3–1.2)
Total Protein: 5.2 g/dL — ABNORMAL LOW (ref 6.5–8.1)

## 2020-07-10 NOTE — Evaluation (Signed)
Occupational Therapy Assessment and Plan  Patient Details  Name: Tyler Richards MRN: [id number] Date of Birth: August 31, 1951  OT Diagnosis: abnormal posture, cognitive deficits, disturbance of vision, hemiplegia affecting non-dominant side and muscular wasting and disuse atrophy Rehab Potential: Rehab Potential (ACUTE ONLY): Good ELOS: 26-28 days   Today's Date: 07/10/2020 OT Individual Time  0800-0902 (7 mins)     Hospital Problem: Principal Problem:   Right middle cerebral artery stroke [facility])   Past Medical History:  Past Medical History:  Diagnosis Date  . Coronary artery disease   . Hypertension    Past Surgical History:  Past Surgical History:  Procedure Laterality Date  . IR ANGIO INTRA EXTRACRAN SEL COM CAROTID INNOMINATE UNI L MOD SED  06/29/2020  . IR CT HEAD LTD  06/29/2020  . IR CT HEAD LTD  06/29/2020  . IR INTRAVSC STENT CERV CAROTID W/O EMB-PROT MOD SED INC ANGIO  06/29/2020  . IR PERCUTANEOUS ART THROMBECTOMY/INFUSION INTRACRANIAL INC DIAG ANGIO  06/29/2020  . RADIOLOGY WITH ANESTHESIA N/A 06/29/2020   Procedure: IR WITH ANESTHESIA;  Surgeon: Radiologist, Medication, MD;  Location: [place];  Service: Radiology;  Laterality: N/A;    Assessment & Plan Clinical Impression: Patient is a 69 y.o. year old male with recent admission to the hospital on .  Patient transferred to CIR on 07/09/2020 .    Patient currently requires total +2 with basic self-care skills secondary to muscle weakness and muscle paralysis, impaired timing and sequencing, abnormal tone, unbalanced muscle activation and decreased coordination, decreased midline orientation, decreased attention to left and left side neglect, decreased initiation and decreased sitting balance, decreased standing balance, decreased postural control and hemiplegia.  Prior to hospitalization, patient could complete ADLs with total +2.  Patient will benefit from skilled intervention to decrease level of assist with basic self-care  skills and increase independence with basic self-care skills prior to discharge home with care partner.  Anticipate patient will require moderate physical assestance and follow up home health.  OT - End of Session Activity Tolerance: Tolerates 10 - 20 min activity with multiple rests Endurance Deficit: Yes Endurance Deficit Description: Pt reports fatigue with mininal activity and closes eyes immediately with transition to supine OT Assessment Rehab Potential (ACUTE ONLY): Good OT Patient demonstrates impairments in the following area(s): Balance;Cognition;Edema;Endurance;Motor;Pain;Perception;Safety;Sensory;Vision OT Basic ADL's Functional Problem(s): Eating;Grooming;Bathing;Dressing;Toileting OT Transfers Functional Problem(s): Toilet;Tub/Shower OT Additional Impairment(s): Fuctional Use of Upper Extremity OT Plan OT Intensity: Minimum of 1-2 x/day, 45 to 90 minutes OT Frequency: 5 out of 7 days OT Duration/Estimated Length of Stay: 26-28 days OT Treatment/Interventions: Balance/vestibular training;Cognitive remediation/compensation;Community reintegration;DME/adaptive equipment instruction;Disease mangement/prevention;Discharge planning;Functional electrical stimulation;Functional mobility training;Neuromuscular re-education;Psychosocial support;Patient/family education;Pain management;Self Care/advanced ADL retraining;Therapeutic Activities;UE/LE Coordination activities;Visual/perceptual remediation/compensation;Therapeutic Exercise;Splinting/orthotics;UE/LE Strength taining/ROM;Wheelchair propulsion/positioning OT Self Feeding Anticipated Outcome(s): setup OT Basic Self-Care Anticipated Outcome(s): min to mod assist OT Toileting Anticipated Outcome(s): min assist OT Bathroom Transfers Anticipated Outcome(s): min assist OT Recommendation Recommendations for Other Services: Neuropsych consult;Therapeutic Recreation consult Therapeutic Recreation Interventions: Stress management Patient  destination: Home Follow Up Recommendations: Home health OT Equipment Recommended: To be determined   OT Evaluation Precautions/Restrictions  Precautions Precautions: Fall Precaution Comments: left hemiparesis with left hemi inattention  Restrictions Weight Bearing Restrictions: No  Pain Pain Assessment Pain Scale: 0-10 Pain Score: 0-No pain Home Living/Prior Functioning Home Living Family/patient expects to be discharged to:: Private residence Living Arrangements: Spouse/significant other Available Help at Discharge: Family, Available 24 hours/day Type of Home: Mobile home Home Access: Stairs to enter [employer] of Steps:  3 Entrance Stairs-Rails: Right Home Layout: One level Bathroom Shower/Tub: Multimedia programmer: Standard Bathroom Accessibility: Yes  Lives With: Spouse IADL History Homemaking Responsibilities: Yes Meal Prep Responsibility: Primary Laundry Responsibility: Primary Cleaning Responsibility: Primary Current License: Yes Occupation: Retired Prior Function Level of Independence: Independent with gait, Independent with transfers, Independent with homemaking with ambulation  Able to Take Stairs?: Yes Driving: Yes Vocation: Retired Comments: states he enjoys "piddling in the yard" with his doves Vision Baseline Vision/History: Wears glasses Wears Glasses: Reading only Patient Visual Report: No change from baseline Vision Assessment?: Yes Eye Alignment: Within Functional Limits Ocular Range of Motion: Within Functional Limits Alignment/Gaze Preference: Head turned (head turned to the right) Tracking/Visual Pursuits: Decreased smoothness of horizontal tracking;Decreased smoothness of vertical tracking Saccades: Additional head turns occurred during testing Convergence: Impaired (comment) (right eye did not converge) Visual Fields: Impaired-to be further tested in functional context Perception  Perception:  Impaired Inattention/Neglect: Does not attend to left side of body Praxis Praxis: Impaired Praxis Impairment Details: Initiation Cognition Overall Cognitive Status: Impaired/Different from baseline Arousal/Alertness: Awake/alert Orientation Level: Person;Place;Situation Person: Oriented Place: Oriented Situation: Oriented Year: 2021 Month: October Day of Week: Correct Memory: Impaired Memory Impairment: Retrieval deficit;Decreased short term memory Decreased Short Term Memory: Verbal basic;Verbal complex;Functional basic Immediate Memory Recall: Sock;Blue;Bed Memory Recall Sock: Without Cue Memory Recall Blue: Without Cue Memory Recall Bed: With Cue Attention: Focused Focused Attention: Appears intact Sustained Attention: Impaired Sustained Attention Impairment: Functional basic Selective Attention: Impaired Selective Attention Impairment: Functional basic Awareness: Impaired Awareness Impairment: Intellectual impairment;Emergent impairment Problem Solving Impairment: Functional basic Executive Function: Organizing Organizing: Impaired Organizing Impairment: Functional basic;Verbal basic Safety/Judgment: Impaired Sensation Sensation Light Touch: Impaired Detail Light Touch Impaired Details: Impaired LUE Hot/Cold: Not tested Proprioception: Impaired Detail Proprioception Impaired Details: Impaired LUE Stereognosis: Not tested Coordination Gross Motor Movements are Fluid and Coordinated: No Fine Motor Movements are Fluid and Coordinated: No Coordination and Movement Description: Pt exhibits Brunnstrum stage II in the left hand and stage III in the left arm with greater movement noted with gross testing at the elbow compared to the shoulder.  He needs max assist to integrate as an active assist for bathing tasks. Heel Shin Test: unable to perform on L side due to weakness and L knee pain Motor  Motor Motor: Hemiplegia;Abnormal postural alignment and control Motor -  Skilled Clinical Observations: Left hemiplegia present with pusher syndrome  Trunk/Postural Assessment  Cervical Assessment Cervical Assessment: Exceptions to [facility] (head rotation to the right) Thoracic Assessment Thoracic Assessment: Exceptions to [facility] (slight thoracic rounding) Lumbar Assessment Lumbar Assessment: Exceptions to [facility] (posterior pelvic tilt) Postural Control Postural Control: Deficits on evaluation Righting Reactions: LOB to the left  Balance Balance Balance Assessed: Yes Static Sitting Balance Static Sitting - Balance Support: Feet supported Static Sitting - Level of Assistance: 4: Min assist Dynamic Sitting Balance Dynamic Sitting - Balance Support: During functional activity Dynamic Sitting - Level of Assistance: 2: Max Insurance risk surveyor Standing - Balance Support: During functional activity Static Standing - Level of Assistance: 1: +1 Total assist Dynamic Standing Balance Dynamic Standing - Balance Support: During functional activity Dynamic Standing - Level of Assistance: 1: +2 Total assist Extremity/Trunk Assessment RUE Assessment RUE Assessment: Within Functional Limits Active Range of Motion (AROM) Comments: Grossly WFLS for selfcare tasks General Strength Comments: At least 3+/5 throughout but not formally assessed LUE Assessment LUE Assessment: Exceptions to [facility] Passive Range of Motion (PROM) Comments: WFLS General Strength Comments: Pt currently Brunnstrum stage II  in the hand with stage III in the arm.  He needs max assist to integrate into functional tasks such as bathing.  Slight increased edema also noted in the hand and digits.  Care Tool Care Tool Self Care Eating   Eating Assist Level: Supervision/Verbal cueing    Oral Care    Oral Care Assist Level: Set up assist    Bathing   Body parts bathed by patient: Right arm;Chest;Abdomen;Right upper leg;Left upper leg;Face Body parts bathed by helper: Front perineal  area;Buttocks;Left arm;Left lower leg;Right lower leg   Assist Level: 2 Helpers    Upper Body Dressing(including orthotics)   What is the patient wearing?: Pull over shirt   Assist Level: Maximal Assistance - Patient 25 - 49%    Lower Body Dressing (excluding footwear)   What is the patient wearing?: Pants;Incontinence brief Assist for lower body dressing: 2 Helpers    Putting on/Taking off footwear   What is the patient wearing?: Non-skid slipper socks Assist for footwear: Total Assistance - Patient < 25%       Care Tool Toileting Toileting activity   Assist for toileting: 2 Helpers     Care Tool Bed Mobility Roll left and right activity        Sit to lying activity        Lying to sitting edge of bed activity   Lying to sitting edge of bed assist level: Moderate Assistance - Patient 50 - 74%     Care Tool Transfers Sit to stand transfer   Sit to stand assist level: 2 Helpers    Chair/bed transfer   Chair/bed transfer assist level: Dependent - mechanical lift     Toilet transfer   Assist Level: 2 Helpers     Care Tool Cognition Expression of Ideas and Wants Expression of Ideas and Wants: Some difficulty - exhibits some difficulty with expressing needs and ideas (e.g, some words or finishing thoughts) or speech is not clear   Understanding Verbal and Non-Verbal Content Understanding Verbal and Non-Verbal Content: Usually understands - understands most conversations, but misses some part/intent of message. Requires cues at times to understand   Memory/Recall Ability *first 3 days only Memory/Recall Ability *first 3 days only: Current season;That he or she is in a hospital/hospital unit    Refer to Care Plan for [place] OT Short Term Goal 1 (Week 1): Pt will maintain static sitting balance EOB in perparation for selfcare tasks with supervision for at least three mins. OT Short Term Goal 2 (Week 1): Pt will complete LB bathing sit  to stand with max assist for two consecutive sessions. OT Short Term Goal 3 (Week 1): Pt will complete toilet transfer squat pivot with max assist . OT Short Term Goal 4 (Week 1): Pt will donn a pullover shirt with no more than mod assist for two consecutive sessions.  Recommendations for other services: Neuropsych and Therapeutic Recreation  Stress management    Skilled Therapeutic Intervention ADL  ADL Eating: Supervision/safety Where Assessed-Eating: Bed level Grooming: Supervision/safety Where Assessed-Grooming: Bed level Upper Body Bathing: Minimal assistance Where Assessed-Upper Body Bathing: Other (Comment) (3:1) Lower Body Bathing: Dependent Where Assessed-Lower Body Bathing: Edge of bed Upper Body Dressing: Maximal assistance Where Assessed-Upper Body Dressing: Edge of bed Lower Body Dressing: Dependent Where Assessed-Lower Body Dressing: Edge of bed Toileting: Dependent Where Assessed-Toileting: Bedside Commode Toilet Transfer: Dependent Toilet Transfer Method: Other (comment) Charlaine Dalton) Toilet Transfer Equipment: Radiographer, therapeutic: Not  assessed Social research officer, government: Not assessed Mobility  Bed Mobility Bed Mobility: Sit to Supine;Supine to Sit Supine to Sit: Moderate Assistance - Patient 50-74% Sit to Supine: Moderate Assistance - Patient 50-74% Transfers Sit to Stand: 2 Helpers Stand to Sit: Total Assistance - Patient < 25%  Pt completed supine to sit EOB with mod assist to begin session.  His spouse was in the room and reports that she will bring in him some clothing as well as some shoes tomorrow.  Upon sitting, he demonstrates right head turn with increased LOB to the left in sitting.  He reported the need to use the bathroom, so BSC brought out to transfer to .  He needed two attempts to complete sit to stand with total assist from therapist for transfer stand pivot.  His wife assisted with removing the brief while therapist was assisting him with  standing.  He needed total assist +2 (pt 20%) for standing in the [place] to complete toilet hygiene and clothing management.  Worked on most bathing sitting on the toilet while waiting for NT to assist.  He needed mod instructional cueing to sequence with therapist assisting to wash his back and RUE.  After transfer with the Stedy to the EOB, he was able to work on washing his lower legs and feet.  Max assist for removal of gripper socks and washing lower legs and feet.  He reported increased fatigue after completion of this and returned to supine with mod assist.  Discussed expectations of LOS with pt and spouse as well as his likely need to have at least min to mod assist at discharge.  She voiced understanding and will be providing 24 hour assist per her report.  Pt left in bed with safety alarm in place and call button and phone in reach.    Discharge Criteria: Patient will be discharged from OT if patient refuses treatment 3 consecutive times without medical reason, if treatment goals not met, if there is a change in medical status, if patient makes no progress towards goals or if patient is discharged from hospital.  The above assessment, treatment plan, treatment alternatives and goals were discussed and mutually agreed upon: by patient and by family  Ryah Cribb  OTR/L 07/10/2020, 4:57 PM

## 2020-07-10 NOTE — Progress Notes (Signed)
[place] PHYSICAL MEDICINE & REHABILITATION PROGRESS NOTE   Subjective/Complaints:  No issues overnite  ROS-   Objective:   No results found. Recent Labs    07/09/20 0626 07/10/20 0451  WBC 7.3 7.5  HGB 10.2* 9.2*  HCT 29.8* 27.6*  PLT 586* 539*   Recent Labs    07/09/20 0626 07/10/20 0451  NA 138 137  K 3.3* 4.0  CL 107 106  CO2 24 24  GLUCOSE 104* 125*  BUN 9 14  CREATININE 0.64 0.76  CALCIUM 8.0* 8.1*    Intake/Output Summary (Last 24 hours) at 07/10/2020 0636 Last data filed at 07/09/2020 1828 Gross per 24 hour  Intake 177 ml  Output --  Net 177 ml        Physical Exam: Vital Signs Blood pressure (!) 146/78, pulse 79, temperature 98.7 F (37.1 C), resp. rate 18, height 6' (1.829 m), weight 86.2 kg, SpO2 99 %.   General: No acute distress Mood and affect are appropriate Heart: Regular rate and rhythm no rubs murmurs or extra sounds Lungs: Clear to auscultation, breathing unlabored, no rales or wheezes Abdomen: Positive bowel sounds, soft nontender to palpation, nondistended Extremities: No clubbing, cyanosis, or edema Skin: No evidence of breakdown, no evidence of rash Neurologic: Cranial nerves II through XII intact, motor strength is 5/5 in bilateral deltoid, bicep, tricep, grip, hip flexor, knee extensors, ankle dorsiflexor and plantar flexor Sensory exam normal sensation to light touch and proprioception in bilateral upper and lower extremities Cerebellar exam normal finger to nose to finger as well as heel to shin in bilateral upper and lower extremities Musculoskeletal: Full range of motion in all 4 extremities. No joint swelling   Assessment/Plan: 1. Functional deficits secondary to Right MCA infarct which require 3+ hours per day of interdisciplinary therapy in a comprehensive inpatient rehab setting.  Physiatrist is providing close team supervision and 24 hour management of active medical problems listed below.  Physiatrist and rehab  team continue to assess barriers to discharge/monitor patient progress toward functional and medical goals  Care Tool:  Bathing              Bathing assist       Upper Body Dressing/Undressing Upper body dressing        Upper body assist      Lower Body Dressing/Undressing Lower body dressing            Lower body assist       Toileting Toileting    Toileting assist Assist for toileting: 2 Helpers     Transfers Chair/bed transfer  Transfers assist           Locomotion Ambulation   Ambulation assist              Walk 10 feet activity   Assist           Walk 50 feet activity   Assist           Walk 150 feet activity   Assist           Walk 10 feet on uneven surface  activity   Assist           Wheelchair     Assist               Wheelchair 50 feet with 2 turns activity    Assist            Wheelchair 150 feet activity     Assist  Blood pressure (!) 146/78, pulse 79, temperature 98.7 F (37.1 C), resp. rate 18, height 6' (1.829 m), weight 86.2 kg, SpO2 99 %.    Medical Problem List and Plan: 1.  Left side weakness and slurred speech secondary to right MCA infarction due to right ICA and MCA occlusion status post revascularization and stenting             -patient may shower             -ELOS/Goals: 2-3 weeks modI 2.  Antithrombotics: -DVT/anticoagulation: Venous Doppler studies negative.  SCDs             -antiplatelet therapy: Aspirin 81 mg daily, currently off of Brilinta given GIB and ICA has reoccluded 3. Pain Management: Tylenol as needed. Well controlled 4. Mood: Provide emotional support             -antipsychotic agents: N/A 5. Neuropsych: This patient is capable of making decisions on his own behalf. 6. Skin/Wound Care: Routine skin checks 7. Fluids/Electrolytes/Nutrition: Routine in and outs with follow-up chemistries. K+ 3.3 on 10/14- received 69meq Klor. -  corrected to 4.0 on 10/15 8.  Constipation.  MiraLAX daily and Colace twice daily. No BM on this regimen but would like to hold off on additional medications at this time.  9.  Hyperlipidemia.  Lipitor 10.  Gout.  Colchicine twice daily.  Monitor for any gout flareups 11.  Orthostasis/bradycardia.  ProAmatine 5 mg every 8 hours as well as Florinef 0.1 mg daily.  Monitor with increased mobility.  Follow-up per cardiology services Order orthostatic vitals Vitals:   07/09/20 1939 07/10/20 0441  BP: 122/62 (!) 146/78  Pulse: (!) 50 79  Resp: 18 18  Temp: 98.5 F (36.9 C) 98.7 F (37.1 C)  SpO2: 98% 99%   12.  Enterobacter UTI.  Completing course of Bactrim. 13.  Rectal bleeding.  Follow-up GI services.  Currently no plan for endoscopic studies and monitor hemoglobin hematocrit.  Awaiting plan for possible colonoscopy - Hgb down 1 gm, recheck in in am   LOS: 1 days A FACE TO [place] E Lakeesha Fontanilla 07/10/2020, 6:36 AM

## 2020-07-10 NOTE — Evaluation (Signed)
Speech Language Pathology Assessment and Plan  Patient Details  Name: Tyler Richards MRN: [id number] Date of Birth: Jun 21, 1951  SLP Diagnosis: Dysarthria;Cognitive Impairments;Dysphagia  Rehab Potential: Good ELOS: 3.5-4 weeks    Today's Date: 07/10/2020 SLP Individual Time: 1000-1057 SLP Individual Time Calculation (min): 73 min   Hospital Problem: Principal Problem:   Right middle cerebral artery stroke [facility])  Past Medical History:  Past Medical History:  Diagnosis Date   Coronary artery disease    Hypertension    Past Surgical History:  Past Surgical History:  Procedure Laterality Date   IR ANGIO INTRA EXTRACRAN SEL COM CAROTID INNOMINATE UNI L MOD SED  06/29/2020   IR CT HEAD LTD  06/29/2020   IR CT HEAD LTD  06/29/2020   IR INTRAVSC STENT CERV CAROTID W/O EMB-PROT MOD SED INC ANGIO  06/29/2020   IR PERCUTANEOUS ART THROMBECTOMY/INFUSION INTRACRANIAL INC DIAG ANGIO  06/29/2020   RADIOLOGY WITH ANESTHESIA N/A 06/29/2020   Procedure: IR WITH ANESTHESIA;  Surgeon: Radiologist, Medication, MD;  Location: [place];  Service: Radiology;  Laterality: N/A;    Assessment / Plan / Recommendation Clinical Impression   HPI: Tyler Richards is a 69 year old right-handed male with unremarkable past medical history no prescription medications.  Per chart review lives with spouse independent and active prior to admission.  1 level home 2 steps to entry.  Presented 06/29/2020 with acute onset of left-sided weakness and slurred speech.  Cranial CT scan showed hyperdense distal right ICA and proximal MCA.  Blunted appearance of the posterior right putamen.  Chronic right high frontal cortex infarct.  Patient did not receive TPA.  CT angiogram of head and neck emergent large vessel occlusion with no flow seen in the right internal carotid artery or proximal MCA.  Patient underwent right MCA thrombectomy and right ICA stent placement 06/29/2020 per interventional radiology. Most recent MRI and imaging  revealed acute infarct right basal ganglia with nonprogressive hemorrhage when correlated with a prior CT of 06/29/2020.  Lower extremity Dopplers no signs of DVT.  Carotid Dopplers no ICA stenosis.  Echocardiogram with ejection fraction of 50 to 50% grade 1 diastolic dysfunction.  Admission chemistries unremarkable aside from glucose 104 urine drug screen negative.  Patient did receive cardiology consult for bradycardia consistent with hyper vagotonia and currently maintained on ProAmatine as well as Florinef.  EKG normal sinus rhythm 70s to 80s occasional sinus bradycardia.  Patient was initially maintained on aspirin as well as Brilinta.  Patient was extubated 06/30/2020.  Urine culture greater 100,000 Enterobacter placed on Maxipime changed to Bactrim.  Gastroenterology services consulted due to some bright red blood with bowel movement currently holding off on any endoscopic evaluation monitor hemoglobin hematocrit with latest hemoglobin 9.8 and no further episodes reported.  His Brilinta has been placed on hold after rectal outlet bleeding and continues only on low-dose aspirin at the recommendations of neurology services.  Currently on a dysphagia #1 thin liquid diet.  Therapy evaluations completed and patient was admitted for a comprehensive rehab program 07/09/20 and SLP evaluations were completed 07/10/20 with results as follows:  Pt presents with moderate oral dysphagia characterized by weak lingual manipulation and left buccal pocketing of solids. However, he is becoming mor independent with use of finger sweep and liquid washes to clear minimal pocketing. No appreciable difference in efficiency or pocketing noted between Dys 1 (puree) and Dys 2 (minced/ground) throughout trials. No overt s/sx aspiration noted across solids or thin liquids. Moderate left facial asymmetry and lingual weakness  noted during oral mech. Recommend pt upgrade to Dys 2 (minched/ground) textures, continue thin liquids,  medications whole in puree, full supervision to ensure use of swallow strategies.  Pt also demonstrates mild dysarthria and higher level cognitive deficits. [name] speech was fluent and expressive/receptive language WFL, however intelligibility slightly reduced in conversation (85-90%) due to articulatory imprecision and pt reports feeling very discontent with quality of his speech at this time. He would benefit from education and brief interventions to increase his intelligibility. Complex problem solving, selective attention, emergent awareness, and left visual inattention noted during functional tasks and impacting his functional status. [name] wife was present and also noted perceived delayed processing at times; very mild processing deficits noted during evaluation and may have also been influenced by fatigue.   Recommend pt receive skilled ST to address dysphagia, dysarthria, and cognitive impairments as described above in order to ensure diet safety, efficiency, as well as maximize functional communication, safety, and independence prior to discharge home with his wife.    uid Bedside swallow and cognitive-linguistic evaluations were administered and results were reviewed with pt (please see above for details regarding results).   SLP Assessment  Patient will need skilled Speech Lanaguage Pathology Services during CIR admission    Recommendations  SLP Diet Recommendations: Dysphagia 2 (Fine chop);Thin Liquid Administration via: Cup;Straw Medication Administration: Whole meds with puree Supervision: Patient able to self feed;Full supervision/cueing for compensatory strategies Compensations: Minimize environmental distractions;Slow rate;Small sips/bites;Lingual sweep for clearance of pocketing Postural Changes and/or Swallow Maneuvers: Seated upright 90 degrees Oral Care Recommendations: Oral care BID Recommendations for Other Services: Neuropsych consult Patient destination: Home Follow up  Recommendations: 24 hour supervision/assistance (follow up ST TBD depending on progress) Equipment Recommended: None recommended by SLP    SLP Frequency 3 to 5 out of 7 days   SLP Duration  SLP Intensity  SLP Treatment/Interventions 3.5-4 weeks  Minumum of 1-2 x/day, 30 to 90 minutes  Cognitive remediation/compensation;Cueing hierarchy;Dysphagia/aspiration precaution training;Functional tasks;Patient/family education;Internal/external aids;Speech/Language facilitation    Pain Pain Assessment Pain Scale: 0-10 Pain Score: 0-No pain     SLP Evaluation Cognition Overall Cognitive Status: Impaired/Different from baseline Arousal/Alertness: Awake/alert Orientation Level: Oriented to person;Oriented to place;Other (comment);Oriented to situation (oriented to month and year, not specific date or day of week) Attention: Selective Sustained Attention: Appears intact Selective Attention: Impaired Selective Attention Impairment: Functional basic Memory: Appears intact Awareness: Impaired Awareness Impairment: Emergent impairment Problem Solving Impairment: Functional basic Executive Function: Writer: Impaired Organizing Impairment: Functional basic;Verbal basic Safety/Judgment: Impaired  Comprehension Auditory Comprehension Overall Auditory Comprehension: Appears within functional limits for tasks assessed (although mild delay in processing at times) EffectiveTechniques: Extra processing time Visual Recognition/Discrimination Discrimination: Not tested Reading Comprehension Reading Status: Not tested Expression Expression Primary Mode of Expression: Verbal Verbal Expression Overall Verbal Expression: Appears within functional limits for tasks assessed Non-Verbal Means of Communication: Not applicable Written Expression Written Expression: Not tested Oral Motor Oral Motor/Sensory Function Overall Oral Motor/Sensory Function: Moderate impairment Facial ROM:  Reduced left;Suspected CN VII (facial) dysfunction Facial Symmetry: Abnormal symmetry left;Suspected CN VII (facial) dysfunction Facial Strength: Reduced left;Suspected CN VII (facial) dysfunction Lingual ROM: Reduced left;Suspected CN XII (hypoglossal) dysfunction Lingual Symmetry: Abnormal symmetry left Lingual Strength: Reduced;Suspected CN XII (hypoglossal) dysfunction Velum: Within Functional Limits Motor Speech Overall Motor Speech: Impaired Respiration: Within functional limits Phonation: Normal Resonance: Within functional limits Articulation: Impaired Level of Impairment: Conversation Intelligibility: Intelligibility reduced Word: 75-100% accurate Phrase: 75-100% accurate Sentence: 75-100% accurate Conversation: 75-100% accurate Motor Planning: Witnin functional  limits Motor Speech Errors: Not applicable  Care Tool Care Tool Cognition Expression of Ideas and Wants Expression of Ideas and Wants: Some difficulty - exhibits some difficulty with expressing needs and ideas (e.g, some words or finishing thoughts) or speech is not clear   Understanding Verbal and Non-Verbal Content Understanding Verbal and Non-Verbal Content: Usually understands - understands most conversations, but misses some part/intent of message. Requires cues at times to understand   Memory/Recall Ability *first 3 days only Memory/Recall Ability *first 3 days only: Current season;That he or she is in a hospital/hospital unit     Intelligibility: Intelligibility reduced Word: 75-100% accurate Phrase: 75-100% accurate Sentence: 75-100% accurate Conversation: 75-100% accurate   Date of Onset: 06/29/20 Previous Swallow Assessment: BSE and MBS 07/01/20 Temperature Spikes Noted: No Respiratory Status: Room air History of Recent Intubation: Yes Length of Intubations (days): 1 days Date extubated: 07/01/20 Behavior/Cognition: Cooperative;Pleasant mood;Lethargic/Drowsy Oral Cavity - Dentition: Adequate  natural dentition Self-Feeding Abilities: Able to feed self Vision: Impaired for self-feeding (left inattention) Patient Positioning: Upright in bed Baseline Vocal Quality: Normal Volitional Cough: Strong Volitional Swallow: Able to elicit  Oral Care Assessment Does patient have any of the following "high(er) risk" factors?: None of the above Does patient have any of the following "at risk" factors?: Other - dysphagia Patient is AT RISK: Order set for Adult Oral Care Protocol initiated -  "At Risk Patients" option selected (see row information) Patient is LOW RISK: Follow universal precautions (see row information) Ice Chips Ice chips: Not tested Thin Liquid Thin Liquid: Within functional limits Presentation: Cup;Self Fed;Straw Nectar Thick Nectar Thick Liquid: Not tested Honey Thick Honey Thick Liquid: Not tested Puree Puree: Impaired Presentation: Self Fed;Spoon Oral Phase Impairments: Reduced labial seal;Reduced lingual movement/coordination Solid Solid: Impaired Presentation: Self Fed;Spoon Oral Phase Impairments: Reduced labial seal;Reduced lingual movement/coordination Oral Phase Functional Implications: Left lateral sulci pocketing BSE Assessment Risk for Aspiration Impact on safety and function: Mild aspiration risk  Short Term Goals: Week 1: SLP Short Term Goal 1 (Week 1): Pt will demonstrate efficient mastication and oral clearance and minimal overt s/sx aspiration on current (recently upgraded) Dys 2 (minced/ground) texture diet with thin liquids and no more than Supervision A cues for use of compensatory swallow strategies. SLP Short Term Goal 2 (Week 1): Pt will consume trials of Dys 3 (mech soft) solids X3 with efficient mastication and oral clearance and no more than Supervision A cues required for use of swallow straetgies prior to advancement. SLP Short Term Goal 3 (Week 1): Pt will demonstrate ability to problem solve mildly complex to complex tasks with Min A  verbal/visual cues. SLP Short Term Goal 4 (Week 1): Pt will selectively attend to tasks with Min A verbal cues for redirection. SLP Short Term Goal 5 (Week 1): Pt will detect functional errors wtih Min A verbal/visual cues. SLP Short Term Goal 6 (Week 1): Pt will use speech intelligibility strategies to achieve 95% intelligibility in conversation with Min A verbal cues.  Refer to Care Plan for Long Term Goals  Recommendations for other services: Neuropsych  Discharge Criteria: Patient will be discharged from SLP if patient refuses treatment 3 consecutive times without medical reason, if treatment goals not met, if there is a change in medical status, if patient makes no progress towards goals or if patient is discharged from hospital.  The above assessment, treatment plan, treatment alternatives and goals were discussed and mutually agreed upon: by patient and his wife  Little Ishikawa 07/10/2020, 12:35 PM

## 2020-07-10 NOTE — IPOC Note (Addendum)
Overall Plan of Care [facility]) Patient Details Name: Tyler Richards MRN: [id number] DOB: July 08, 1951  Admitting Diagnosis: Right middle cerebral artery stroke [facility])  Hospital Problems: Principal Problem:   Right middle cerebral artery stroke [facility])     Functional Problem List: Nursing Bladder, Bowel, Edema, Endurance, Medication Management, Motor, Safety  PT Balance, Motor, Safety, Behavior, Nutrition, Sensory, Edema, Pain, Skin Integrity, Endurance, Perception  OT Balance, Cognition, Edema, Endurance, Motor, Pain, Perception, Safety, Sensory, Vision  SLP Cognition, Linguistic, Nutrition, Safety  TR         Basic ADL's: OT Eating, Grooming, Bathing, Dressing, Toileting     Advanced  ADL's: OT       Transfers: PT Bed Mobility, Bed to Chair, Car, Furniture, Floor  OT Toilet, Tub/Shower     Locomotion: PT Ambulation, Stairs, Wheelchair Mobility     Additional Impairments: OT Fuctional Use of Upper Extremity  SLP Swallowing, Communication, Social Cognition expression Attention, Awareness, Problem Solving  TR      Anticipated Outcomes Item Anticipated Outcome  Self Feeding setup  Swallowing  Mod I   Basic self-care  min to mod assist  Toileting  min assist   Bathroom Transfers min assist  Bowel/Bladder  moderate I  Transfers  min assist  Locomotion  min assist short distances using LRAD  Communication  Mod I  Cognition  Supervision A  Pain  pain less 2  Safety/Judgment  moderate I   Therapy Plan: PT Intensity: Minimum of 1-2 x/day ,45 to 90 minutes PT Frequency: 5 out of 7 days PT Duration Estimated Length of Stay: 3.5-4 weeks OT Intensity: Minimum of 1-2 x/day, 45 to 90 minutes OT Frequency: 5 out of 7 days OT Duration/Estimated Length of Stay: 26-28 days SLP Intensity: Minumum of 1-2 x/day, 30 to 90 minutes SLP Frequency: 3 to 5 out of 7 days SLP Duration/Estimated Length of Stay: 3.5-4 weeks   Due to the current state of emergency, patients may not  be receiving their 3-hours of Medicare-mandated therapy.   Team Interventions: Nursing Interventions Bladder Management, Bowel Management, Disease Management/Prevention, Medication Management, Patient/Family Education, Dysphagia/Aspiration Precaution Training  PT interventions Ambulation/gait training, Training and development officer, Cognitive remediation/compensation, Community reintegration, Discharge planning, Disease management/prevention, DME/adaptive equipment instruction, Functional electrical stimulation, Functional mobility training, Neuromuscular re-education, Pain management, Patient/family education, Psychosocial support, Skin care/wound management, Splinting/orthotics, Stair training, Therapeutic Activities, Therapeutic Exercise, UE/LE Strength taining/ROM, UE/LE Coordination activities, Visual/perceptual remediation/compensation, Wheelchair propulsion/positioning  OT Interventions Training and development officer, Cognitive remediation/compensation, Academic librarian, Engineer, drilling, Disease mangement/prevention, Discharge planning, Functional electrical stimulation, Functional mobility training, Neuromuscular re-education, Psychosocial support, Patient/family education, Pain management, Self Care/advanced ADL retraining, Therapeutic Activities, UE/LE Coordination activities, Visual/perceptual remediation/compensation, Therapeutic Exercise, Splinting/orthotics, UE/LE Strength taining/ROM, Wheelchair propulsion/positioning  SLP Interventions Cognitive remediation/compensation, English as a second language teacher, Dysphagia/aspiration precaution training, Functional tasks, Patient/family education, Internal/external aids, Speech/Language facilitation  TR Interventions    SW/CM Interventions Discharge Planning, Psychosocial Support, Patient/Family Education   Barriers to Discharge MD  Medical stability  Nursing Other (comments)    PT Inaccessible home environment, Home environment  access/layout, Incontinence, Neurogenic Bowel & Bladder, Nutrition means    OT      SLP      SW Home environment access/layout 2 steps to enter   Team Discharge Planning: Destination: PT-Home ,OT- Home , SLP-Home Projected Follow-up: PT-Home health PT, 24 hour supervision/assistance, OT-  Home health OT, SLP-24 hour supervision/assistance (follow up ST TBD depending on progress) Projected Equipment Needs: PT-To be determined, OT- To be determined, SLP-None recommended by SLP Equipment Details:  PT- , OT-  Patient/family involved in discharge planning: PT- Patient, Family member/caregiver,  OT-Patient, Family member/caregiver, SLP-Patient, Family member/caregiver  MD ELOS: 18-21d Medical Rehab Prognosis:  Good Assessment:   69 year old right-handed male with unremarkable past medical history no prescription medications.  Per chart review lives with spouse independent and active prior to admission.  1 level home 2 steps to entry.  Presented 06/29/2020 with acute onset of left-sided weakness and slurred speech.  Cranial CT scan showed hyperdense distal right ICA and proximal MCA.  Blunted appearance of the posterior right putamen.  Chronic right high frontal cortex infarct.  Patient did not receive TPA.  CT angiogram of head and neck emergent large vessel occlusion with no flow seen in the right internal carotid artery or proximal MCA.  Patient underwent right MCA thrombectomy and right ICA stent placement 06/29/2020 per interventional radiology. Most recent MRI and imaging revealed acute infarct right basal ganglia with nonprogressive hemorrhage when correlated with a prior CT of 06/29/2020.  Lower extremity Dopplers no signs of DVT.  Carotid Dopplers no ICA stenosis.  Echocardiogram with ejection fraction of 50 to 62% grade 1 diastolic dysfunction.  Admission chemistries unremarkable aside from glucose 104 urine drug screen negative.  Patient did receive cardiology consult for bradycardia consistent  with hyper vagotonia and currently maintained on ProAmatine as well as Florinef.  EKG normal sinus rhythm 70s to 80s occasional sinus bradycardia.  Patient was initially maintained on aspirin as well as Brilinta.  Patient was extubated 06/30/2020.  Urine culture greater 100,000 Enterobacter placed on Maxipime changed to Bactrim.  Gastroenterology services consulted due to some bright red blood with bowel movement currently holding off on any endoscopic evaluation monitor hemoglobin hematocrit with latest hemoglobin 9.8 and no further episodes reported.  His Brilinta has been placed on hold after rectal outlet bleeding and continues only on low-dose aspirin at the recommendations of neurology services.  Currently on a dysphagia #1 thin liquid diet   See Team Conference Notes for weekly updates to the plan of care

## 2020-07-10 NOTE — Progress Notes (Signed)
   Patient Details  Name: Tyler Richards MRN: [id number] Date of Birth: 1951-05-05  Today's Date: 07/10/2020  Hospital Problems: Principal Problem:   Right middle cerebral artery stroke [facility])  Past Medical History:  Past Medical History:  Diagnosis Date  . Coronary artery disease   . Hypertension    Past Surgical History:  Past Surgical History:  Procedure Laterality Date  . IR ANGIO INTRA EXTRACRAN SEL COM CAROTID INNOMINATE UNI L MOD SED  06/29/2020  . IR CT HEAD LTD  06/29/2020  . IR CT HEAD LTD  06/29/2020  . IR INTRAVSC STENT CERV CAROTID W/O EMB-PROT MOD SED INC ANGIO  06/29/2020  . IR PERCUTANEOUS ART THROMBECTOMY/INFUSION INTRACRANIAL INC DIAG ANGIO  06/29/2020  . RADIOLOGY WITH ANESTHESIA N/A 06/29/2020   Procedure: IR WITH ANESTHESIA;  Surgeon: Radiologist, Medication, MD;  Location: [facility];  Service: Radiology;  Laterality: N/A;   Social History:  reports that he has never smoked. He has never used smokeless tobacco. He reports previous alcohol use. He reports that he does not use drugs.  Family / Support Systems Spouse/Significant Other: Horticulturist, commercial: spouse Ability/Limitations of Caregiver: none Caregiver Availability: 24/7  Social History Preferred language: English Religion:  Read: Yes Write: Yes Employment Status: Disabled   Abuse/Neglect Abuse/Neglect Assessment Can Be Completed: Yes Physical Abuse: Denies Verbal Abuse: Denies Sexual Abuse: Denies Exploitation of patient/patient's resources: Denies Self-Neglect: Denies  Emotional Status [name] affect, behavior and adjustment status: no Recent Psychosocial Issues: no Psychiatric History: no Substance Abuse History: no  Patient / Family Perceptions, Expectations & Goals Pt/Family understanding of illness & functional limitations: yes, spouse updated thus far Premorbid pt/family roles/activities: Independent/Walking/Driving Anticipated changes in roles/activities/participation: Spouse  able to assit Pt/family expectations/goals: Goal to d/c Min A  [employer]: None Premorbid Home Care/DME Agencies: Other (Comment) (Grab bars (tub and shower),Wheelchair, Walker, Radio producer, glasses) Transportation available at discharge: family able to transport  Discharge Planning Living Arrangements: Spouse/significant other Support Systems: Spouse/significant other Type of Residence: Private residence [facility]: 2 steps to enter (R side railings)) Insurance Resources: Chartered certified accountant Screen Referred: No Money Management: Patient, Spouse Does the patient have any problems obtaining your medications?: No Care Coordinator Barriers to Discharge: Home environment access/layout Care Coordinator Barriers to Discharge Comments: 2 steps to enter Care Coordinator Anticipated Follow Up Needs: [place] Additional Notes/Comments: Condom Cath, Expected length of stay: 2-3 weeks  Clinical Impression Sw entered room. Spouse at bedside, introduced self, explained role and process. Will continue to follow up   [name] 07/10/2020, 12:49 PM

## 2020-07-10 NOTE — Evaluation (Signed)
Physical Therapy Assessment and Plan  Patient Details  Name: Tyler Richards MRN: [id number] Date of Birth: Feb 19, 1951  PT Diagnosis: Abnormal posture, Abnormality of gait, Ataxia, Ataxic gait, Cognitive deficits, Coordination disorder, Difficulty walking, Hemiparesis non-dominant, Impaired cognition, Impaired sensation, Muscle weakness and Pain in joint Rehab Potential: Good ELOS: 3.5-4 weeks   Today's Date: 07/10/2020 PT Individual Time: 9509-3267 PT Individual Time Calculation (min): 60 min    Hospital Problem: Principal Problem:   Right middle cerebral artery stroke [facility])   Past Medical History:  Past Medical History:  Diagnosis Date  . Coronary artery disease   . Hypertension    Past Surgical History:  Past Surgical History:  Procedure Laterality Date  . IR ANGIO INTRA EXTRACRAN SEL COM CAROTID INNOMINATE UNI L MOD SED  06/29/2020  . IR CT HEAD LTD  06/29/2020  . IR CT HEAD LTD  06/29/2020  . IR INTRAVSC STENT CERV CAROTID W/O EMB-PROT MOD SED INC ANGIO  06/29/2020  . IR PERCUTANEOUS ART THROMBECTOMY/INFUSION INTRACRANIAL INC DIAG ANGIO  06/29/2020  . RADIOLOGY WITH ANESTHESIA N/A 06/29/2020   Procedure: IR WITH ANESTHESIA;  Surgeon: Radiologist, Medication, MD;  Location: [place];  Service: Radiology;  Laterality: N/A;    Assessment & Plan Clinical Impression: Patient is a 69 y.o. year old right-handed male with unremarkable past medical history no prescription medications.  Per chart review lives with spouse independent and active prior to admission.  1 level home 2 steps to entry.  Presented 06/29/2020 with acute onset of left-sided weakness and slurred speech.  Cranial CT scan showed hyperdense distal right ICA and proximal MCA.  Blunted appearance of the posterior right putamen.  Chronic right high frontal cortex infarct.  Patient did not receive TPA.  CT angiogram of head and neck emergent large vessel occlusion with no flow seen in the right internal carotid artery or proximal  MCA.  Patient underwent right MCA thrombectomy and right ICA stent placement 06/29/2020 per interventional radiology. Most recent MRI and imaging revealed acute infarct right basal ganglia with nonprogressive hemorrhage when correlated with a prior CT of 06/29/2020.  Lower extremity Dopplers no signs of DVT.  Carotid Dopplers no ICA stenosis.  Echocardiogram with ejection fraction of 50 to 12% grade 1 diastolic dysfunction.  Admission chemistries unremarkable aside from glucose 104 urine drug screen negative.  Patient did receive cardiology consult for bradycardia consistent with hyper vagotonia and currently maintained on ProAmatine as well as Florinef.  EKG normal sinus rhythm 70s to 80s occasional sinus bradycardia.  Patient was initially maintained on aspirin as well as Brilinta.  Patient was extubated 06/30/2020.  Urine culture greater 100,000 Enterobacter placed on Maxipime changed to Bactrim.  Gastroenterology services consulted due to some bright red blood with bowel movement currently holding off on any endoscopic evaluation monitor hemoglobin hematocrit with latest hemoglobin 9.8 and no further episodes reported.  His Brilinta has been placed on hold after rectal outlet bleeding and continues only on low-dose aspirin at the recommendations of neurology services.  Currently on a dysphagia #1 thin liquid diet.  Therapy evaluations completed and patient was admitted for a comprehensive rehab program. Patient transferred to CIR on 07/09/2020 .   Patient currently requires +2 max assist with mobility secondary to muscle weakness, muscle joint tightness and muscle paralysis, decreased cardiorespiratoy endurance, impaired timing and sequencing, abnormal tone, unbalanced muscle activation, motor apraxia, ataxia, decreased coordination and decreased motor planning, decreased visual perceptual skills and decreased visual motor skills, decreased attention to left, decreased initiation,  decreased attention, decreased  awareness, decreased problem solving, decreased safety awareness, decreased memory and delayed processing and decreased sitting balance, decreased standing balance, decreased postural control, hemiplegia and decreased balance strategies.  Prior to hospitalization, patient was independent  with mobility and lived with Spouse in a Mobile home home.  Home access is 3Stairs to enter.  Patient will benefit from skilled PT intervention to maximize safe functional mobility, minimize fall risk and decrease caregiver burden for planned discharge home with 24 hour assist.  Anticipate patient will benefit from follow up [facility] at discharge.  PT - End of Session Activity Tolerance: Tolerates 30+ min activity with multiple rests Endurance Deficit: Yes Endurance Deficit Description: requries frequent seated rest breaks due to fatigue PT Assessment Rehab Potential (ACUTE/IP ONLY): Good PT Barriers to Discharge: [place] home environment;Home environment access/layout;Incontinence;Neurogenic Bowel & Bladder;Nutrition means PT Patient demonstrates impairments in the following area(s): Balance;Motor;Safety;Behavior;Nutrition;Sensory;Edema;Pain;Skin Integrity;Endurance;Perception PT Transfers Functional Problem(s): Bed Mobility;Bed to Chair;Car;Furniture;Floor PT Locomotion Functional Problem(s): Ambulation;Stairs;Wheelchair Mobility PT Plan PT Intensity: Minimum of 1-2 x/day ,45 to 90 minutes PT Frequency: 5 out of 7 days PT Duration Estimated Length of Stay: 3.5-4 weeks PT Treatment/Interventions: Ambulation/gait training;Balance/vestibular training;Cognitive remediation/compensation;Community reintegration;Discharge planning;Disease management/prevention;DME/adaptive equipment instruction;Functional electrical stimulation;Functional mobility training;Neuromuscular re-education;Pain management;Patient/family education;Psychosocial support;Skin care/wound management;Splinting/orthotics;Stair training;Therapeutic  Activities;Therapeutic Exercise;UE/LE Strength taining/ROM;UE/LE Coordination activities;Visual/perceptual remediation/compensation;Wheelchair propulsion/positioning PT Transfers Anticipated Outcome(s): min assist PT Locomotion Anticipated Outcome(s): min assist short distances using LRAD PT Recommendation Recommendations for Other Services: Neuropsych consult Follow Up Recommendations: Home health PT;24 hour supervision/assistance Patient destination: Home Equipment Recommended: To be determined   PT Evaluation Precautions/Restrictions  Precautions Precautions: Fall;Other (comment) Precaution Comments: left hemiparesis with left hemi inattention; monitor HR Restrictions Weight Bearing Restrictions: No Pain  Pain Assessment Pain Scale: 0-10 Pain Score: 0-No pain  At beginning of session denied pain but upon performing L knee flexion/extension AROM and PROM pt reports pain with noticeable swelling and pt/wife reporting he has gout in his knee with MD aware. No reports of increased pain during [place] activities. Home Living/Prior Functioning Home Living Living Arrangements: Spouse/significant other Available Help at Discharge: Family;Available 24 hours/day Type of Home: Mobile home Home Access: Stairs to enter Entrance Stairs-Number of Steps: 3 Entrance Stairs-Rails: Right Home Layout: One level Bathroom Shower/Tub: Multimedia programmer: Standard Bathroom Accessibility: Yes  Lives With: Spouse Prior Function Level of Independence: Independent with gait;Independent with transfers;Independent with homemaking with ambulation  Able to Take Stairs?: Yes Driving: Yes Vocation: Retired Comments: states he enjoys "piddling in the yard" with his doves Social research officer, government Perception: Impaired Inattention/Neglect: Does not attend to left side of body;Does not attend to left visual field Body Part Identification: impaired R/L identification noted during ambulation with  cuing Spatial Orientation: impaired upright, midline orientation Praxis Praxis: Impaired Praxis Impairment Details: Initiation;Motor planning  Cognition  Overall Cognitive Status: Impaired/Different from baseline Arousal/Alertness: Awake/alert Orientation Level: Oriented to person;Oriented to place;Oriented to situation;Disoriented to time (states "I know it is Sunday" despite it being Friday but is aware of month and year) Attention: Focused Focused Attention: Appears intact Sustained Attention: Impaired Selective Attention: Impaired Memory: Impaired Awareness: Impaired Safety/Judgment: Impaired Comments: L inattention Sensation Sensation Light Touch: Impaired Detail Central sensation comments: unable to sense light touch on L LE Light Touch Impaired Details: Impaired LLE Hot/Cold: Not tested Proprioception: Impaired Detail Proprioception Impaired Details: Impaired LLE Stereognosis: Not tested Coordination Gross Motor Movements are Fluid and Coordinated: No Coordination and Movement Description: impaired gross motor coordination due to L hemiparesis Heel Shin Test: unable  to perform on L side due to weakness and L knee pain Motor  Motor Motor: Hemiplegia;Abnormal postural alignment and control Motor - Skilled Clinical Observations: L hemiparesis with pusher tendencies   Trunk/Postural Assessment  Cervical Assessment Cervical Assessment: Exceptions to [facility] (R cervical rotation) Thoracic Assessment Thoracic Assessment: Exceptions to [facility] (thoracic rounding) Lumbar Assessment Lumbar Assessment: Exceptions to [facility] (posterior pelvic tilt) Postural Control Postural Control: Deficits on evaluation Trunk Control: impaired midline orientation Righting Reactions: L LOB Protective Responses: delayed and insufficient  Balance Balance Balance Assessed: Yes Static Sitting Balance Static Sitting - Balance Support: Feet supported Static Sitting - Level of Assistance: 4: Min  assist Dynamic Sitting Balance Dynamic Sitting - Balance Support: During functional activity Dynamic Sitting - Level of Assistance: 2: Max assist Static Standing Balance Static Standing - Balance Support: During functional activity;Right upper extremity supported Static Standing - Level of Assistance: 1: +1 Total assist;2: Max assist Dynamic Standing Balance Dynamic Standing - Balance Support: During functional activity;Right upper extremity supported Dynamic Standing - Level of Assistance: 2: Max assist;1: +1 Total assist Extremity Assessment      RLE Assessment RLE Assessment: Within Functional Limits General Strength Comments: Grossly 4+/5 to 5/5 in hip, knee, and ankle LLE Assessment LLE Assessment: Exceptions to [facility] General Strength Comments: swelling noted in L knee limiting knee flexion/extension PROM - some difficulty following commands to perform assessment LLE Strength Left Hip Flexion: 2+/5 Left Knee Flexion: 2-/5 Left Knee Extension: 1/5 Left Ankle Dorsiflexion: 2-/5 Left Ankle Plantar Flexion: 2-/5  Care Tool Care Tool Bed Mobility Roll left and right activity   Roll left and right assist level: Maximal Assistance - Patient 25 - 49%    Sit to lying activity   Sit to lying assist level: Maximal Assistance - Patient 25 - 49%    Lying to sitting edge of bed activity   Lying to sitting edge of bed assist level: Maximal Assistance - Patient 25 - 49%     Care Tool Transfers Sit to stand transfer   Sit to stand assist level: Maximal Assistance - Patient 25 - 49%    Chair/bed transfer   Chair/bed transfer assist level: Maximal Assistance - Patient 25 - 49% (squat pivot)     Psychologist, counselling transfer activity did not occur: Safety/medical concerns        Care Tool Locomotion Ambulation Ambulation activity did not occur: Safety/medical concerns (required skilled intervention to ambulate 24ft at hallway rail)        Walk 10 feet  activity Walk 10 feet activity did not occur: Safety/medical concerns       Walk 50 feet with 2 turns activity Walk 50 feet with 2 turns activity did not occur: Safety/medical concerns      Walk 150 feet activity Walk 150 feet activity did not occur: Safety/medical concerns      Walk 10 feet on uneven surfaces activity Walk 10 feet on uneven surfaces activity did not occur: Safety/medical concerns      Stairs Stair activity did not occur: Safety/medical concerns        Walk up/down 1 step activity Walk up/down 1 step or curb (drop down) activity did not occur: Safety/medical concerns     Walk up/down 4 steps activity did not occuR: Safety/medical concerns  Walk up/down 4 steps activity      Walk up/down 12 steps activity Walk up/down 12 steps activity did not occur: Safety/medical concerns  Pick up small objects from floor Pick up small object from the floor (from standing position) activity did not occur: Safety/medical concerns      Wheelchair Will patient use wheelchair at discharge?:  (TBD)          Wheel 50 feet with 2 turns activity      Wheel 150 feet activity        Refer to Care Plan for Long Term Goals  SHORT TERM GOAL WEEK 1 PT Short Term Goal 1 (Week 1): Pt will consistently perform supine<>sit with mod assist PT Short Term Goal 2 (Week 1): Pt will perform sit<>stand with mod assist PT Short Term Goal 3 (Week 1): Pt will perform bed<>chair transfers with mod assist of 1 PT Short Term Goal 4 (Week 1): Pt will ambulate at least 49ft using LRAD with +2 max assist  Recommendations for other services: Neuropsych  Skilled Therapeutic Intervention  Evaluation completed (see details above) with pt/family education regarding purpose of PT evaluation, PT POC and goals, therapy schedule, weekly team meetings, and other CIR information including safety plan and fall risk safety. Individual treatment initiated with focus on activity tolerance, bed mobility,  transfers, gait training, and L attention. Pt/family report pt has been incontinent of BM. Rolling R/L with max assist for trunk/pelvis rotation and L hemibody management during total assist LB clothing management - no incontinence noted in brief. Therapist educated pt/family on neurogenic bowel/bladder with increased sense of urgency and increased frequency with plan to initiate a timed toileting protocol while on CIR. Supine>sitting R EOB, HOB flat but using bedrail, with max assist for trunk upright and L hemibody management - cuing for use of R UE to assist with trunk upright. Sit<>stand x2 to/from EOB using R UE support on bedrail with max assist for lifting into standing and therapist facilitating L hip/knee extension - pt has significantly delayed hip/knee extension staying in a squatted position prior to obtaining upright. L squat pivot transfer using R UE support on bedrail with max assist for lifting/pivoting hips and max cuing for head/hips relationship and sequencing of transfer. Therapist provided pt with 18x18 w/c and Roho air cushion to promote increased upright, OOB activity tolerance and increased pressure relief with education to pt/family on importance of pressure relief with additional education necessary. Gait training ~8ft x2 using R UE support on hallway rail with max assist of 1 and +2 w/c follow - pt demos ataxic type movements in B LEs with wider BOS and significantly decreased step lengths - pt able to advance L LE with mod assist for improved positioning. Pt becomes significantly fearful of falling while standing/ambulating stating he feels that his legs are becoming weak and requires seated rest break. At end of session pt agreeable to remain seated in w/c - left with needs in reach, seat belt alarm on, and lap tray in place for L UE support (pt/family education on L UE positioning while in sitting), and pt's wife present.  Mobility Bed Mobility Bed Mobility: Sit to Supine;Supine to  Sit Supine to Sit: Maximal Assistance - Patient - Patient 25-49% Sit to Supine: Moderate Assistance - Patient 50-74%;Maximal Assistance - Patient 25-49% Transfers Transfers: Sit to Stand;Stand to Sit;Squat Pivot Transfers Sit to Stand: Maximal Assistance - Patient 25-49% Stand to Sit: Maximal Assistance - Patient 25-49% Squat Pivot Transfers: Maximal Assistance - Patient 25-49% Transfer (Assistive device): None Locomotion  Gait Ambulation: Yes Gait Assistance: Maximal Assistance - Patient 25-49%;2 Helpers (+2 assist w/c follow) Gait Distance (Feet):  3 Feet Assistive device: Other (Comment) (R hallway rail) Gait Assistance Details: Tactile cues for initiation;Tactile cues for sequencing;Tactile cues for weight shifting;Tactile cues for posture;Tactile cues for placement;Tactile cues for weight beaing;Verbal cues for sequencing;Verbal cues for technique;Verbal cues for gait pattern;Visual cues/gestures for sequencing;Manual facilitation for weight shifting;Manual facilitation for placement Gait Gait: Yes Gait Pattern: Impaired Gait Pattern: Decreased step length - right;Decreased step length - left;Decreased stride length;Step-to pattern;Decreased hip/knee flexion - left;Ataxic;Left flexed knee in stance;Poor foot clearance - left;Wide base of support Gait velocity: significantly decreased Stairs / Additional Locomotion Stairs: No Wheelchair Mobility Wheelchair Mobility: No   Discharge Criteria: Patient will be discharged from PT if patient refuses treatment 3 consecutive times without medical reason, if treatment goals not met, if there is a change in medical status, if patient makes no progress towards goals or if patient is discharged from hospital.  The above assessment, treatment plan, treatment alternatives and goals were discussed and mutually agreed upon: by patient and by family  Tawana Scale , PT, DPT, CSRS  07/10/2020, 12:18 PM

## 2020-07-10 NOTE — Progress Notes (Signed)
[place] Individual Statement of Services  Patient Name:  Tyler Richards  Date:  07/10/2020  Welcome to the [facility].  Our goal is to provide you with an individualized program based on your diagnosis and situation, designed to meet your specific needs.  With this comprehensive rehabilitation program, you will be expected to participate in at least 3 hours of rehabilitation therapies Monday-Friday, with modified therapy programming on the weekends.  Your rehabilitation program will include the following services:  Physical Therapy (PT), Occupational Therapy (OT), Speech Therapy (ST), 24 hour per day rehabilitation nursing, Therapeutic Recreaction (TR), Neuropsychology, Care Coordinator, Rehabilitation Medicine, Nutrition Services, Pharmacy Services and Other  Weekly team conferences will be held on Wednesday to discuss your progress.  Your Inpatient Rehabilitation Care Coordinator will talk with you frequently to get your input and to update you on team discussions.  Team conferences with you and your family in attendance may also be held.  Expected length of stay: 2-3 Weeks  Overall anticipated outcome: Min A   Depending on your progress and recovery, your program may change. Your Inpatient Rehabilitation Care Coordinator will coordinate services and will keep you informed of any changes. Your Inpatient Rehabilitation Care Coordinator's name and contact numbers are listed  below.  The following services may also be recommended but are not provided by the [facility]:    [place] will be made to provide these services after discharge if needed.  Arrangements include referral to agencies that provide these services.  Your insurance has been verified to be:  Medicare Your primary doctor is:  NO PCP  Pertinent information will be shared with your  doctor and your insurance company.  Inpatient Rehabilitation Care Coordinator:  Erlene Quan, [place] or [phone]  Information discussed with and copy given to patient by: [name], 07/10/2020, 11:36 AM

## 2020-07-11 ENCOUNTER — Inpatient Hospital Stay (HOSPITAL_COMMUNITY): Payer: Medicare Other | Admitting: Physical Therapy

## 2020-07-11 ENCOUNTER — Inpatient Hospital Stay (HOSPITAL_COMMUNITY): Payer: Medicare Other | Admitting: Occupational Therapy

## 2020-07-11 ENCOUNTER — Inpatient Hospital Stay (HOSPITAL_COMMUNITY): Payer: Medicare Other | Admitting: Speech Pathology

## 2020-07-11 LAB — CBC
HCT: 29.2 % — ABNORMAL LOW (ref 39.0–52.0)
Hemoglobin: 9.8 g/dL — ABNORMAL LOW (ref 13.0–17.0)
MCH: 33 pg (ref 26.0–34.0)
MCHC: 33.6 g/dL (ref 30.0–36.0)
MCV: 98.3 fL (ref 80.0–100.0)
Platelets: 593 10*3/uL — ABNORMAL HIGH (ref 150–400)
RBC: 2.97 MIL/uL — ABNORMAL LOW (ref 4.22–5.81)
RDW: 12.3 % (ref 11.5–15.5)
WBC: 6.9 10*3/uL (ref 4.0–10.5)
nRBC: 0 % (ref 0.0–0.2)

## 2020-07-11 MED ORDER — BACLOFEN 10 MG PO TABS
10.0000 mg | ORAL_TABLET | Freq: Every day | ORAL | Status: DC
  Administered 2020-07-11 – 2020-07-14 (×4): 10 mg via ORAL
  Filled 2020-07-11 (×4): qty 1

## 2020-07-11 NOTE — Progress Notes (Signed)
Physical Therapy Session Note  Patient Details  Name: Tyler Richards MRN: [id number] Date of Birth: November 19, 1950  Today's Date: 07/11/2020 PT Individual Time: 2119-4174 and 0814-4818 PT Individual Time Calculation (min): 59 min and 27 min  Short Term Goals: Week 1:  PT Short Term Goal 1 (Week 1): Pt will consistently perform supine<>sit with mod assist PT Short Term Goal 2 (Week 1): Pt will perform sit<>stand with mod assist PT Short Term Goal 3 (Week 1): Pt will perform bed<>chair transfers with mod assist of 1 PT Short Term Goal 4 (Week 1): Pt will ambulate at least 38ft using LRAD with +2 max assist  Skilled Therapeutic Interventions/Progress Updates:    Session 1: Pt received supine in bed with his wife present and pt agreeable to therapy session. Pt/wife report pt has been incontinent of BM. Rolling R/L in bed using bedrails with min/mod assist and cuing for sequencing to increase pt independence. Therapist performed total assist LB clothing management and peri-care - incontinent of small BM and during peri-care pt started urinating thinking that therapist had provided him with the urinal made pt aware with cuing for L attention due to R gaze preference - discussed timed toileting again with RN and NT to assist with improved bowel/bladder control. Pt noted to have some redness on bottom Tyler Karvonen, RN aware. Supine>sitting R EOB, HOB elevated ~10degrees and using bedrail with min assist for trunk upright - cuing for sequencing and L hemibody attention. Sitting EOB donned pants, shirt, socks, and shoes with max assist for time management - pt able to lift LEs to assist with LB clothing management - cuing for L hemibody attention during this task. Pt reports need to use bathroom more. L squat pivot EOB>BSC with +2 mod assist for lifting/pivoting hips - continued max cuing for sequencing of head/hips relationship. Sit<>stand to/from [facility] using R UE support on bedrail with max assist for lifting into  standing - continues to demo delayed trunk/hip/extesion but improved from yesterday - total assist LB clothing management. Provided pillow for back comfort while on BSC and pt only able to have additional BM smear. Reports starting to feel "faint" while on BSC. L squat pivot transfer BSC>w/c with +2 max assist for lifting and pivoting hips with cuing for head/hips relationship. Elevated B LEs and assessed vitals: BP 149/123 (MAP 132), HR 88bpm then reassessed after a few minute sin sitting: BP 100/63 (MAP 75), HR 47bpm with pt reporting improvement in symptoms. Tyler Karvonen, RN made aware. Squat pivot w/c<>EOB with mod/max assist going towards R and mod assist to the L - cuing throughout for head/hips relationship and for R UE placement to decrease pushing tendencies. Sitting EOB focused on trunk control and L attention task via visually scanning to locate items on L then reach cross body to grasp with R hand - L LOB requiring max assist to maintain upright with facilitation at pelvis - cuing to reach to external target on R side to improve midline orientation. At end of session pt left seated in w/c with needs in reach and seat belt alarm on.  Session 2: Pt received supine in bed with his wife present and pt reporting he was just able to get comfortable but with encouragement agreeable to therapy session. Supine>sitting R EOB, HOB partially elevated and using bedrail, with min assist for trunk upright and cuing for L hemibody management. Block practice squat pivot transfer EOB<>w/c focusing on pt education on head/hips relationship with pt demoing recall/carryover - L pivot with  mod assist for lifting/pivoting hips but then requires heavier mod assist for R pivot due to increased difficulty with head/hips relationship. Sit<>stands x3 to/from EOB using R UE support on bedrail with mod/max assist for lifting/balance - pt has much more significant pusher tendencies this afternoon having strong L lean - had pt's wife stand  in front of him for visual feedback on midline orientation - progressed to standing reaching within BOS anteriorly and to R to promote R weight shift and upright posture - continues to require mod/max assist for balance during due to worsening L lean. Sit>supine with mod assist for B LE management into the bed. Therapeutically positioned L UE on pillows and pt left supine in bed with needs in reach, bed alarm on, and his wife present.  Therapy Documentation Precautions:  Precautions Precautions: Fall, Other (comment) Precaution Comments: left hemiparesis with left hemi inattention; monitor HR Restrictions Weight Bearing Restrictions: No  Pain: Session 1: Reports he had pain on his buttocks last night - notified RN and educated pt/family on rolling for pressure relief. During session reports low back pain - provided repositioning, rest, and distraction/emotional support for pain management - RN provided medication. Continues to have L knee pain associated with gout.  Session 2: Continues to have L knee pain associated with gout and continues to report low back pain - pt premedicated - provided rest breaks, repositioning, and distraction/emotional support for pain management.  Therapy/Group: Individual Therapy  Tawana Scale , PT, DPT, CSRS  07/11/2020, 7:58 AM

## 2020-07-11 NOTE — Progress Notes (Signed)
Speech Language Pathology Daily Session Note  Patient Details  Name: Tyler Richards MRN: [id number] Date of Birth: 1951-06-23  Today's Date: 07/11/2020 SLP Individual Time: 1030-1110 SLP Individual Time Calculation (min): 40 min  Short Term Goals: Week 1: SLP Short Term Goal 1 (Week 1): Pt will demonstrate efficient mastication and oral clearance and minimal overt s/sx aspiration on current (recently upgraded) Dys 2 (minced/ground) texture diet with thin liquids and no more than Supervision A cues for use of compensatory swallow strategies. SLP Short Term Goal 2 (Week 1): Pt will consume trials of Dys 3 (mech soft) solids X3 with efficient mastication and oral clearance and no more than Supervision A cues required for use of swallow straetgies prior to advancement. SLP Short Term Goal 3 (Week 1): Pt will demonstrate ability to problem solve mildly complex to complex tasks with Min A verbal/visual cues. SLP Short Term Goal 4 (Week 1): Pt will selectively attend to tasks with Min A verbal cues for redirection. SLP Short Term Goal 5 (Week 1): Pt will detect functional errors wtih Min A verbal/visual cues. SLP Short Term Goal 6 (Week 1): Pt will use speech intelligibility strategies to achieve 95% intelligibility in conversation with Min A verbal cues.  Skilled Therapeutic Interventions: Skilled treatment session focused on cognitive and speech goals. SLP facilitated session by providing education in regards to speech intelligibility strategies. Patient unable to recall strategies after a 2 minute delay, therefore, a visual aid was provided to maximize carryover. Patient utilized the strategies during a verbal reasoning task with overall Min A verbal cues. Patient also overall Mod I for verbal reasoning. Patient required Max verbal cues for recall some biographical information like family pets and for safety with transfer back to bed via the [facility] with +2 assist due to severe pushing to the left. When  transferred back to bed, patient was incontinent of bowel. Patient left in bed with NT present. Continue with current plan of care.      Pain No/Denies Pain   Therapy/Group: Individual Therapy  Yi Falletta 07/11/2020, 1:31 PM

## 2020-07-11 NOTE — Progress Notes (Signed)
[place] PHYSICAL MEDICINE & REHABILITATION PROGRESS NOTE   Subjective/Complaints:  No issues overnite, good results from laxative, feels like he might need O2 but sat 98% RA ROS- neg CP, SOB, N/V/D  Objective:   No results found. Recent Labs    07/10/20 0451 07/11/20 0449  WBC 7.5 6.9  HGB 9.2* 9.8*  HCT 27.6* 29.2*  PLT 539* 593*   Recent Labs    07/09/20 0626 07/10/20 0451  NA 138 137  K 3.3* 4.0  CL 107 106  CO2 24 24  GLUCOSE 104* 125*  BUN 9 14  CREATININE 0.64 0.76  CALCIUM 8.0* 8.1*    Intake/Output Summary (Last 24 hours) at 07/11/2020 0639 Last data filed at 07/10/2020 0900 Gross per 24 hour  Intake 240 ml  Output --  Net 240 ml        Physical Exam: Vital Signs Blood pressure (!) 104/53, pulse (!) 47, temperature 98.1 F (36.7 C), temperature source Oral, resp. rate 18, height 6' (1.829 m), weight 85.9 kg, SpO2 98 %.   General: No acute distress Mood and affect are appropriate Heart: Regular rate and rhythm no rubs murmurs or extra sounds Lungs: Clear to auscultation, breathing unlabored, no rales or wheezes Abdomen: Positive bowel sounds, soft nontender to palpation, nondistended Extremities: No clubbing, cyanosis, or edema Skin: No evidence of breakdown, no evidence of rash Neurologic: Cranial nerves II through XII intact, motor strength is 5/5 in right and trace left deltoid, bicep, tricep, grip, hip flexor, knee extensors, ankle dorsiflexor and plantar flexor Tone- increased knee flexor tone but neg triple flexor with L foot stim  Musculoskeletal: Full range of motion in all 4 extremities. No joint swelling   Assessment/Plan: 1. Functional deficits secondary to Right MCA infarct which require 3+ hours per day of interdisciplinary therapy in a comprehensive inpatient rehab setting.  Physiatrist is providing close team supervision and 24 hour management of active medical problems listed below.  Physiatrist and rehab team continue to  assess barriers to discharge/monitor patient progress toward functional and medical goals  Care Tool:  Bathing    Body parts bathed by patient: Right arm, Chest, Abdomen, Right upper leg, Left upper leg, Face   Body parts bathed by helper: Front perineal area, Buttocks, Left arm, Left lower leg, Right lower leg     Bathing assist Assist Level: 2 Helpers     Upper Body Dressing/Undressing Upper body dressing   What is the patient wearing?: Pull over shirt    Upper body assist Assist Level: Maximal Assistance - Patient 25 - 49%    Lower Body Dressing/Undressing Lower body dressing      What is the patient wearing?: Pants, Incontinence brief     Lower body assist Assist for lower body dressing: Maximal Assistance - Patient 25 - 49%     Toileting Toileting    Toileting assist Assist for toileting: Total Assistance - Patient < 25%     Transfers Chair/bed transfer  Transfers assist     Chair/bed transfer assist level: Maximal Assistance - Patient 25 - 49% (squat pivot)     Locomotion Ambulation   Ambulation assist   Ambulation activity did not occur: Safety/medical concerns (required skilled intervention to ambulate 68ft at hallway rail)          Walk 10 feet activity   Assist  Walk 10 feet activity did not occur: Safety/medical concerns        Walk 50 feet activity   Assist Walk 50 feet  with 2 turns activity did not occur: Safety/medical concerns         Walk 150 feet activity   Assist Walk 150 feet activity did not occur: Safety/medical concerns         Walk 10 feet on uneven surface  activity   Assist Walk 10 feet on uneven surfaces activity did not occur: Safety/medical concerns         Wheelchair     Assist Will patient use wheelchair at discharge?:  (TBD)             Wheelchair 50 feet with 2 turns activity    Assist            Wheelchair 150 feet activity     Assist          Blood pressure  (!) 104/53, pulse (!) 47, temperature 98.1 F (36.7 C), temperature source Oral, resp. rate 18, height 6' (1.829 m), weight 85.9 kg, SpO2 98 %.    Medical Problem List and Plan: 1.  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  Antithrombotics: -DVT/anticoagulation: Venous Doppler studies negative.  SCDs             -antiplatelet therapy: Aspirin 81 mg daily, currently off of Brilinta given GIB and ICA has reoccluded 3. Pain Management: Tylenol as needed. Well controlled 4. Mood: Provide emotional support             -antipsychotic agents: N/A 5. Neuropsych: This patient is capable of making decisions on his own behalf. 6. Skin/Wound Care: Routine skin checks 7. Fluids/Electrolytes/Nutrition: Routine in and outs with follow-up chemistries. K+ 3.3 on 10/14- received 34meq Klor. - corrected to 4.0 on 10/15 8.  Constipation.  MiraLAX daily and Colace twice daily. No BM on this regimen but would like to hold off on additional medications at this time.  9.  Hyperlipidemia.  Lipitor 10.  Gout.  Colchicine twice daily.  Monitor for any gout flareups 11.  Orthostasis/bradycardia.  ProAmatine 5 mg every 8 hours as well as Florinef 0.1 mg daily.  Monitor with increased mobility.  Follow-up per cardiology services Order orthostatic vitals Vitals:   07/10/20 1949 07/11/20 0331  BP: (!) 97/57 (!) 104/53  Pulse: 60 (!) 47  Resp: 18 18  Temp: 98 F (36.7 C) 98.1 F (36.7 C)  SpO2: 100% 98%  low /soft BPs cont to monitor, asympt brady  12.  Enterobacter UTI.  Completing course of Bactrim. 13.  Rectal bleeding.  Follow-up GI services.  Currently no plan for endoscopic studies and monitor hemoglobin hematocrit.  Awaiting plan for possible colonoscopy - Hgb down 1 gm, recheck improved at 9.8, monitor weekly  and if gross bleeding appears 14.  Spasticity Left hamstring which bothers pt at noc- order baclofen, avoid tizanidine due to BP issues LOS: 2 days A FACE TO FACE EVALUATION WAS PERFORMED  Charlett Blake 07/11/2020, 6:39 AM

## 2020-07-11 NOTE — Progress Notes (Signed)
Occupational Therapy Session Note  Patient Details  Name: Tyler Richards MRN: [id number] Date of Birth: 1951/04/28  Today's Date: 07/11/2020 OT Individual Time: 1330-1430 OT Individual Time Calculation (min): 60 min    Short Term Goals: Week 1:  OT Short Term Goal 1 (Week 1): Pt will maintain static sitting balance EOB in perparation for selfcare tasks with supervision for at least three mins. OT Short Term Goal 2 (Week 1): Pt will complete LB bathing sit to stand with max assist for two consecutive sessions. OT Short Term Goal 3 (Week 1): Pt will complete toilet transfer squat pivot with max assist . OT Short Term Goal 4 (Week 1): Pt will donn a pullover shirt with no more than mod assist for two consecutive sessions.  Skilled Therapeutic Interventions/Progress Updates:    Pt received in bed with spouse in the room.  She stated he had just had a BM in his brief.   The first part of the session focused on bed mobility skills (rolling using R arm to support L, bending knees, rolling to R using core vs pulling on rails, and bridging) to doff pants and brief, cleanse pt, apply new alleyvn patch to buttocks and don new pants. Pt initially very hesitant to bend L knee due to gout pain but after several repetitions pt actually tolerating movement better. He was able to get R leg in pants with max A and pull pants partially over hips using a bridge.    Rolled to R and move into sit by pushing up with R elbow with mod A.  Once sitting EOB, doffed and donned shirt with mod A all with close S for balance. He has some active movement in LUE and needed max cues to actively push and pull arm through shirt sleeve.   Completed 1 sit to stand with +2 A with  Max A overall, but pt not putting any weight on LLE and it was pulling up into flexion. Difficult to tell if tone was kicking in or if pt guarding his leg due to gout pain.  He did not want to try it again so moved back to supine.  Got pt all set up in  bed in chair position to work on [place] and then he stated he needed to urinate. Obtained the urinal for pt and demonstrated to wife how to set it up so she can help him with it at other times (her request).  Pt sat holding urinal for 5 minutes and was unable to void.   LUE a/arom exercises focusing on shoulder, biceps, triceps and grasp.  Demonstrated to wife how to do PROM to LUE and L ankle as he has limited dorsiflexion.  Pt is developing motor movement in his LUE but needs frequent cues to attend to L hand and visually focus on it during specific exercises.   Pt resting in bed with all needs met and alarm set.   Therapy Documentation Precautions:  Precautions Precautions: Fall, Other (comment) Precaution Comments: left hemiparesis with left hemi inattention; monitor HR Restrictions Weight Bearing Restrictions: No   Pain: Pain Assessment Pain Score: 5  Pain Location: Knee Pain Orientation: Left Pain Descriptors / Indicators: Aching Pain Onset: With Activity ADL: ADL Eating: Supervision/safety Where Assessed-Eating: Bed level Grooming: Supervision/safety Where Assessed-Grooming: Bed level Upper Body Bathing: Minimal assistance Where Assessed-Upper Body Bathing: Other (Comment) (3:1) Lower Body Bathing: Dependent Where Assessed-Lower Body Bathing: Edge of bed Upper Body Dressing: Maximal assistance Where Assessed-Upper Body Dressing: [employer]  of bed Lower Body Dressing: Dependent Where Assessed-Lower Body Dressing: Edge of bed Toileting: Dependent Where Assessed-Toileting: Bedside Commode Toilet Transfer: Dependent Toilet Transfer Method: Other (comment) Charlaine Dalton) Toilet Transfer Equipment: Radiographer, therapeutic: Not assessed Social research officer, government: Not assessed   Therapy/Group: Individual Therapy  [place] 07/11/2020, 3:06 PM

## 2020-07-12 LAB — CBC
HCT: 29.2 % — ABNORMAL LOW (ref 39.0–52.0)
Hemoglobin: 9.7 g/dL — ABNORMAL LOW (ref 13.0–17.0)
MCH: 32.1 pg (ref 26.0–34.0)
MCHC: 33.2 g/dL (ref 30.0–36.0)
MCV: 96.7 fL (ref 80.0–100.0)
Platelets: 623 10*3/uL — ABNORMAL HIGH (ref 150–400)
RBC: 3.02 MIL/uL — ABNORMAL LOW (ref 4.22–5.81)
RDW: 12.4 % (ref 11.5–15.5)
WBC: 7.2 10*3/uL (ref 4.0–10.5)
nRBC: 0 % (ref 0.0–0.2)

## 2020-07-12 MED ORDER — LOPERAMIDE HCL 2 MG PO CAPS
2.0000 mg | ORAL_CAPSULE | Freq: Four times a day (QID) | ORAL | Status: DC | PRN
  Administered 2020-07-12 – 2020-07-20 (×4): 2 mg via ORAL
  Filled 2020-07-12 (×4): qty 1

## 2020-07-12 NOTE — Progress Notes (Signed)
[place] PHYSICAL MEDICINE & REHABILITATION PROGRESS NOTE   Subjective/Complaints:  Per CNA had blood in stool yesterday  ROS- neg CP, SOB, N/V/D  Objective:   No results found. Recent Labs    07/11/20 0449 07/12/20 0432  WBC 6.9 7.2  HGB 9.8* 9.7*  HCT 29.2* 29.2*  PLT 593* 623*   Recent Labs    07/10/20 0451  NA 137  K 4.0  CL 106  CO2 24  GLUCOSE 125*  BUN 14  CREATININE 0.76  CALCIUM 8.1*    Intake/Output Summary (Last 24 hours) at 07/12/2020 6387 Last data filed at 07/11/2020 1322 Gross per 24 hour  Intake 240 ml  Output --  Net 240 ml        Physical Exam: Vital Signs Blood pressure 110/62, pulse (!) 54, temperature 99.4 F (37.4 C), temperature source Oral, resp. rate 18, height 6' (1.829 m), weight 81.1 kg, SpO2 96 %.   General: No acute distress Mood and affect are appropriate Heart: Regular rate and rhythm no rubs murmurs or extra sounds Lungs: Clear to auscultation, breathing unlabored, no rales or wheezes Abdomen: Positive bowel sounds, soft nontender to palpation, nondistended Extremities: No clubbing, cyanosis, or edema Skin: No evidence of breakdown, no evidence of rash Neurologic: Cranial nerves II through XII intact, motor strength is 5/5 in right and trace left deltoid, bicep, tricep, grip, hip flexor, knee extensors, ankle dorsiflexor and plantar flexor Tone- increased knee flexor tone but neg triple flexor with L foot stim  Musculoskeletal: Full range of motion in all 4 extremities. No joint swelling   Assessment/Plan: 1. Functional deficits secondary to Right MCA infarct which require 3+ hours per day of interdisciplinary therapy in a comprehensive inpatient rehab setting.  Physiatrist is providing close team supervision and 24 hour management of active medical problems listed below.  Physiatrist and rehab team continue to assess barriers to discharge/monitor patient progress toward functional and medical goals  Care  Tool:  Bathing    Body parts bathed by patient: Right arm, Chest, Abdomen, Right upper leg, Left upper leg, Face   Body parts bathed by helper: Front perineal area, Buttocks, Left arm, Left lower leg, Right lower leg     Bathing assist Assist Level: 2 Helpers     Upper Body Dressing/Undressing Upper body dressing   What is the patient wearing?: Pull over shirt    Upper body assist Assist Level: Moderate Assistance - Patient 50 - 74%    Lower Body Dressing/Undressing Lower body dressing      What is the patient wearing?: Incontinence brief     Lower body assist Assist for lower body dressing: Maximal Assistance - Patient 25 - 49%     Toileting Toileting    Toileting assist Assist for toileting: Dependent - Patient 0%     Transfers Chair/bed transfer  Transfers assist     Chair/bed transfer assist level: Maximal Assistance - Patient 25 - 49% (squat pivot)     Locomotion Ambulation   Ambulation assist   Ambulation activity did not occur: Safety/medical concerns (required skilled intervention to ambulate 65ft at hallway rail)          Walk 10 feet activity   Assist  Walk 10 feet activity did not occur: Safety/medical concerns        Walk 50 feet activity   Assist Walk 50 feet with 2 turns activity did not occur: Safety/medical concerns         Walk 150 feet activity  Assist Walk 150 feet activity did not occur: Safety/medical concerns         Walk 10 feet on uneven surface  activity   Assist Walk 10 feet on uneven surfaces activity did not occur: Safety/medical concerns         Wheelchair     Assist Will patient use wheelchair at discharge?:  (TBD)             Wheelchair 50 feet with 2 turns activity    Assist            Wheelchair 150 feet activity     Assist          Blood pressure 110/62, pulse (!) 54, temperature 99.4 F (37.4 C), temperature source Oral, resp. rate 18, height 6' (1.829 m),  weight 81.1 kg, SpO2 96 %.    Medical Problem List and Plan: 1.  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  Antithrombotics: -DVT/anticoagulation: Venous Doppler studies negative.  SCDs- no heparin due to GI bleed             -antiplatelet therapy: Aspirin 81 mg daily, currently off of Brilinta given GIB and ICA has reoccluded 3. Pain Management: Tylenol as needed. Well controlled 4. Mood: Provide emotional support             -antipsychotic agents: N/A 5. Neuropsych: This patient is capable of making decisions on his own behalf. 6. Skin/Wound Care: Routine skin checks 7. Fluids/Electrolytes/Nutrition: Routine in and outs with follow-up chemistries. K+ 3.3 on 10/14- received 63meq Klor. - corrected to 4.0 on 10/15 8.  Constipation.  MiraLAX daily and Colace twice daily. No BM on this regimen but would like to hold off on additional medications at this time.  9.  Hyperlipidemia.  Lipitor 10.  Gout.  Colchicine twice daily.  Monitor for any gout flareups 11.  Orthostasis/bradycardia.  ProAmatine 5 mg every 8 hours as well as Florinef 0.1 mg daily.  Monitor with increased mobility.  Follow-up per cardiology services Order orthostatic vitals Vitals:   07/11/20 2145 07/12/20 0312  BP:  110/62  Pulse:  (!) 54  Resp:  18  Temp:  99.4 F (37.4 C)  SpO2: 94% 96%  low /soft BPs cont to monitor, asympt brady  12.  Enterobacter UTI.  Completing course of Bactrim. 13.  Rectal bleeding.  Follow-up GI services.  Currently no plan for endoscopic studies and monitor hemoglobin hematocrit.  Awaiting plan for possible colonoscopy - Hgb down 1 gm, recheck improved at 9.8, stable at 9.7 today  14.  Spasticity Left hamstring which bothers pt at noc- order baclofen, avoid tizanidine due to BP issues- may need  Botox during inpt stay  LOS: 3 days A FACE TO FACE EVALUATION WAS PERFORMED  Charlett Blake 07/12/2020, 6:52 AM

## 2020-07-12 NOTE — Progress Notes (Addendum)
Wife at bedside concern re: patient having several bowel movements. Held laxatives this morning and per NT this is the 4th bm medium to large red brown in color. MD noted on his notes this morning re: GI bleed. MD notified of wife's concern;  new orders noted.

## 2020-07-12 NOTE — Plan of Care (Signed)
  Problem: RH BOWEL ELIMINATION Goal: RH STG MANAGE BOWEL WITH ASSISTANCE Description: STG Manage Bowel with Assistance. Outcome: Not Progressing; incontinence   Problem: RH BLADDER ELIMINATION Goal: RH STG MANAGE BLADDER WITH ASSISTANCE Description: STG Manage Bladder With Assistance Outcome: Not Progressing; incontinence

## 2020-07-13 ENCOUNTER — Inpatient Hospital Stay (HOSPITAL_COMMUNITY): Payer: Medicare Other | Admitting: Occupational Therapy

## 2020-07-13 ENCOUNTER — Inpatient Hospital Stay (HOSPITAL_COMMUNITY): Payer: Medicare Other

## 2020-07-13 ENCOUNTER — Inpatient Hospital Stay (HOSPITAL_COMMUNITY): Payer: Medicare Other | Admitting: Speech Pathology

## 2020-07-13 DIAGNOSIS — I951 Orthostatic hypotension: Secondary | ICD-10-CM

## 2020-07-13 DIAGNOSIS — I495 Sick sinus syndrome: Secondary | ICD-10-CM

## 2020-07-13 LAB — BASIC METABOLIC PANEL
Anion gap: 10 (ref 5–15)
BUN: 13 mg/dL (ref 8–23)
CO2: 23 mmol/L (ref 22–32)
Calcium: 9 mg/dL (ref 8.9–10.3)
Chloride: 104 mmol/L (ref 98–111)
Creatinine, Ser: 0.94 mg/dL (ref 0.61–1.24)
GFR, Estimated: >60 mL/min (ref >60)
Glucose, Bld: 154 mg/dL — ABNORMAL HIGH (ref 70–99)
Potassium: 3.7 mmol/L (ref 3.5–5.1)
Sodium: 137 mmol/L (ref 135–145)

## 2020-07-13 LAB — CBC
HCT: 29.5 % — ABNORMAL LOW (ref 39.0–52.0)
Hemoglobin: 9.9 g/dL — ABNORMAL LOW (ref 13.0–17.0)
MCH: 32.7 pg (ref 26.0–34.0)
MCHC: 33.6 g/dL (ref 30.0–36.0)
MCV: 97.4 fL (ref 80.0–100.0)
Platelets: 644 10*3/uL — ABNORMAL HIGH (ref 150–400)
RBC: 3.03 MIL/uL — ABNORMAL LOW (ref 4.22–5.81)
RDW: 12.4 % (ref 11.5–15.5)
WBC: 5.2 10*3/uL (ref 4.0–10.5)
nRBC: 0 % (ref 0.0–0.2)

## 2020-07-13 MED ORDER — FLUDROCORTISONE ACETATE 0.1 MG PO TABS
0.1000 mg | ORAL_TABLET | Freq: Two times a day (BID) | ORAL | Status: DC
  Administered 2020-07-13 – 2020-08-01 (×38): 0.1 mg via ORAL
  Filled 2020-07-13 (×39): qty 1

## 2020-07-13 NOTE — Progress Notes (Signed)
[place] PHYSICAL MEDICINE & REHABILITATION PROGRESS NOTE   Subjective/Complaints: Felt dizzy with therapy this morning. Wife says he got enema yesterday and has been having diarrhea since. He has been drinking fluids regularly but not 6-8 glasses per day.  Has some lower back pain and gout of left knee and elbow  ROS- neg CP, SOB, N/V/D  Objective:   No results found. Recent Labs    07/12/20 0432 07/13/20 0700  WBC 7.2 5.2  HGB 9.7* 9.9*  HCT 29.2* 29.5*  PLT 623* 644*   No results for input(s): NA, K, CL, CO2, GLUCOSE, BUN, CREATININE, CALCIUM in the last 72 hours.  Intake/Output Summary (Last 24 hours) at 07/13/2020 1115 Last data filed at 07/13/2020 0911 Gross per 24 hour  Intake 440 ml  Output --  Net 440 ml        Physical Exam: Vital Signs Blood pressure (!) 112/55, pulse (!) 42, temperature 98.4 F (36.9 C), resp. rate 17, height 6' (1.829 m), weight 78.4 kg, SpO2 98 %. General: Alert, No apparent distress HEENT: Head is normocephalic, atraumatic, PERRLA, EOMI, sclera anicteric, oral mucosa pink and moist, dentition intact, ext ear canals clear,  Neck: Supple without JVD or lymphadenopathy Heart: Reg rate and rhythm. No murmurs rubs or gallops Chest: CTA bilaterally without wheezes, rales, or rhonchi; no distress Abdomen: Soft, non-tender, non-distended, bowel sounds positive. Extremities: No clubbing, cyanosis, or edema. Pulses are 2+ Skin: Clean and intact without signs of breakdown Neurologic: Cranial nerves II through XII intact, motor strength is 5/5 in right and trace left deltoid, bicep, tricep, grip, hip flexor, knee extensors, ankle dorsiflexor and plantar flexor Tone- increased knee flexor tone but neg triple flexor with L foot stim  Musculoskeletal: Full range of motion in all 4 extremities. No joint swelling Psych: [name] affect is appropriate. Pt is cooperative     Assessment/Plan: 1. Functional deficits secondary to Right MCA infarct which  require 3+ hours per day of interdisciplinary therapy in a comprehensive inpatient rehab setting.  Physiatrist is providing close team supervision and 24 hour management of active medical problems listed below.  Physiatrist and rehab team continue to assess barriers to discharge/monitor patient progress toward functional and medical goals  Care Tool:  Bathing    Body parts bathed by patient: Right arm, Chest, Abdomen, Right upper leg, Left upper leg, Face   Body parts bathed by helper: Front perineal area, Buttocks, Left arm, Left lower leg, Right lower leg     Bathing assist Assist Level: 2 Helpers     Upper Body Dressing/Undressing Upper body dressing   What is the patient wearing?: Pull over shirt    Upper body assist Assist Level: Moderate Assistance - Patient 50 - 74%    Lower Body Dressing/Undressing Lower body dressing      What is the patient wearing?: Incontinence brief     Lower body assist Assist for lower body dressing: Maximal Assistance - Patient 25 - 49%     Toileting Toileting    Toileting assist Assist for toileting: Dependent - Patient 0%     Transfers Chair/bed transfer  Transfers assist     Chair/bed transfer assist level: Maximal Assistance - Patient 25 - 49% (squat pivot)     Locomotion Ambulation   Ambulation assist   Ambulation activity did not occur: Safety/medical concerns (required skilled intervention to ambulate 50ft at hallway rail)          Walk 10 feet activity   Assist  Walk 10  feet activity did not occur: Safety/medical concerns        Walk 50 feet activity   Assist Walk 50 feet with 2 turns activity did not occur: Safety/medical concerns         Walk 150 feet activity   Assist Walk 150 feet activity did not occur: Safety/medical concerns         Walk 10 feet on uneven surface  activity   Assist Walk 10 feet on uneven surfaces activity did not occur: Safety/medical concerns          Wheelchair     Assist Will patient use wheelchair at discharge?:  (TBD)             Wheelchair 50 feet with 2 turns activity    Assist            Wheelchair 150 feet activity     Assist          Blood pressure (!) 112/55, pulse (!) 42, temperature 98.4 F (36.9 C), resp. rate 17, height 6' (1.829 m), weight 78.4 kg, SpO2 98 %.    Medical Problem List and Plan: 1.  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  Antithrombotics: -DVT/anticoagulation: Venous Doppler studies negative.  SCDs- no heparin due to GI bleed             -antiplatelet therapy: Aspirin 81 mg daily, currently off of Brilinta given GIB and ICA has reoccluded 3. Pain Management: Tylenol as needed. Well controlled 4. Mood: Provide emotional support             -antipsychotic agents: N/A 5. Neuropsych: This patient is capable of making decisions on his own behalf. 6. Skin/Wound Care: Routine skin checks 7. Fluids/Electrolytes/Nutrition: Routine in and outs with follow-up chemistries. K+ 3.3 on 10/14- received 74meq K[place]. - corrected to 4.0 on 10/15 8.  Constipation.   10/18: diarrhea s/p enema. Recommended 6-8 cups water per day to stay well hydrated 9.  Hyperlipidemia.  Lipitor 10.  Gout.  Colchicine twice daily.  Monitor for any gout flareups 11.  Orthostasis/bradycardia.  ProAmatine 5 mg every 8 hours as well as Florinef 0.1 mg daily.  Monitor with increased mobility.  Follow-up per cardiology services, Order orthostatic vitals  10/18: consulted cardiology as brady to 42 with symptomatic orthostasis despite medication.  Vitals:   07/13/20 0446 07/13/20 0600  BP: 112/69 (!) 112/55  Pulse: (!) 54 (!) 42  Resp: 17   Temp: 98.4 F (36.9 C)   SpO2: 95% 98%  low /soft BPs cont to  monitor, asympt brady  12.  Enterobacter UTI.  Completing course of Bactrim. 13.  Rectal bleeding.  Follow-up GI services.  Currently no plan for endoscopic studies and monitor hemoglobin hematocrit.  Awaiting plan for possible colonoscopy - Hgb down 1 gm, recheck improved at 9.8, stable at 9.7 today  14.  Spasticity Left hamstring which bothers pt at noc- order baclofen, avoid tizanidine due to BP issues- may need Botox during inpt stay  LOS: 4 days A FACE TO FACE EVALUATION WAS PERFORMED  Connor Meacham P Steffanie Mingle 07/13/2020, 11:15 AM

## 2020-07-13 NOTE — Progress Notes (Signed)
Occupational Therapy Session Note  Patient Details  Name: Tyler Richards MRN: [id number] Date of Birth: 13-May-1951  Today's Date: 07/13/2020 OT Individual Time: 7824-2353 OT Individual Time Calculation (min): 57 min    Short Term Goals: Week 1:  OT Short Term Goal 1 (Week 1): Pt will maintain static sitting balance EOB in perparation for selfcare tasks with supervision for at least three mins. OT Short Term Goal 2 (Week 1): Pt will complete LB bathing sit to stand with max assist for two consecutive sessions. OT Short Term Goal 3 (Week 1): Pt will complete toilet transfer squat pivot with max assist . OT Short Term Goal 4 (Week 1): Pt will donn a pullover shirt with no more than mod assist for two consecutive sessions.  Skilled Therapeutic Interventions/Progress Updates:    Session 1: (6144-3154) Pt completed bathing and dressing supine to sit to stand during session.  [name] spouse present throughout session with pt completing supine to sit EOB with mod assist and mod instructional cueing for sequencing.  He was able to complete UB bathing with min assist overall and max hand over hand for integration of the LUE for washing the right arm.  Slight increased flexor and internal rotator tone noted in the LUE during functional use.  He was able to complete LB bathing with total assist +2 (pt 40%) in the [place].  He still reports increased left knee pain with flexing of the knee as well as with weightbearing tasks.  Increased pushing to the left noted in standing with max demonstrational cueing to shift weight to the right.  Pt with reports of increased dizziness in standing after approximately two mins.  He was sat back down on the bed and demonstrated a hypotensive episode where he was briefly unresponsive to verbal stimulation.  Upon returning to supine he was able to state his name and place.  Nursing made aware and BP and pulse taken in supine at 113/63 with pulse at 43 and then 108/72 with pulse at 41  BPM.  He was able to transition back to sitting for a couple of mins for donning pullover shirt with total assist and checking BP.  It was 88/56 with HR at 48.  He was returned to supine and TEDs and pants were donned with total assist.  He was left with HOB up slightly and resting secondary to decreased BP.  Abdominal binder being ordered as well.    Session 2: (0086-7619) Pt completed transfer from wheelchair to the bed with max assist squat pivot to the right.  Had him work on the EOB with mod assist for removing his shoes. Increased LOB/pushing to the left in sitting when attempting to reach down to unlace his sneakers.  He then transitioned to supine with min assist.  Therapist applied resting hand splint to the left hand with education to the wife on donning and wearing.  She will need practice on this.  Also provided handout for positioning of the LUE and LLE in bed.  Pt left with the call button and phone in reach with pt's spouse present as well.   Therapy Documentation Precautions:  Precautions Precautions: Fall, Other (comment) Precaution Comments: left hemiparesis with left hemi inattention; monitor HR and BP Restrictions Weight Bearing Restrictions: No  Pain: Pain Assessment Pain Scale: 0-10 Pain Score: 0-No pain ADL: See Care Tool Section for some details of mobility and selfcare  Therapy/Group: Individual Therapy  Siddhi Dornbush OTR/L 07/13/2020, 12:17 PM

## 2020-07-13 NOTE — Progress Notes (Signed)
Physical Therapy Session Note  Patient Details  Name: Tyler Richards MRN: [id number] Date of Birth: 1951-04-23  Today's Date: 07/13/2020 PT Individual Time: 3646-8032 PT Individual Time Calculation (min): 72 min   Short Term Goals: Week 1:  PT Short Term Goal 1 (Week 1): Pt will consistently perform supine<>sit with mod assist PT Short Term Goal 2 (Week 1): Pt will perform sit<>stand with mod assist PT Short Term Goal 3 (Week 1): Pt will perform bed<>chair transfers with mod assist of 1 PT Short Term Goal 4 (Week 1): Pt will ambulate at least 34ft using LRAD with +2 max assist  Skilled Therapeutic Interventions/Progress Updates:    Patient received sitting up in bed, eating lunch with wife present at bedside, agreeable to PT. He denies pain, but endorses fatigue. BP sitting in bed: 99/60, TED hose on. He denies dizziness. Patient able to come to sitting edge of bed with MinA and verbal cuing for sequencing. Slight L lateral lean when sitting, but no pushing evident. Patient frequently asking PT to continue "feeding" him, but patient able to demonstrate appropriate use of R UE to successfully feed himself. Episode of bladder incontinence sitting edge of bed requiring ModA to return to supine for pericare. Patient able to complete peri hygiene with supervision and verbal cuing, but MaxA needed for clothing management. Verbal cuing to engage L UE in functional tasks. He was able to transfer to wc via squat pivot with MaxA x1 leading to the R. Slight pushing noted with R leading. PT propelling patient in wc to therapy gym for time management and energy conservation. He was able to transfer to therapy mat via Squat pivot with MaxA x1 leading R. Seated reaching task completed reaching outside his BOS with verbal cuing to attend to L sided visual stimuli. BP after seated task: 102/63 with no reports of dizziness, but he did report increased fatigue. Patient able to complete STS from edge of mat with ModA x2  and mirror for visual feedback. Attempted same reaching task in standing. Patient found to be standing on R LE only and pushing toward L. PT on L side to prevent fall. Verbal and tactile cuing to have patient place L LE on ground. He denied increase in L knee pain as a reason for SLS. Mirror used for midline orientation assist, but patient continuing with strong pushing in standing. Patient reporting sudden onset increase in fatigue, BP: 107/69. After extended seated rest break, patient agreeable to standing again. [place] x2 HHA. Standing lateral weight shifts to encourage weight thru L LE with good success. Mirror for visual feedback on achieving midline. Patient returning to wc via squat pivot ModA x1 leading to the L. Patient agreeable to remaining up in wc after education on importance of remaining up for improved BP management. Seatbelt on, call light within reach, wife at bedside.   Therapy Documentation Precautions:  Precautions Precautions: Fall, Other (comment) Precaution Comments: left hemiparesis with left hemi inattention; monitor HR Restrictions Weight Bearing Restrictions: No    Therapy/Group: Individual Therapy  Karoline Caldwell, PT, DPT, CBIS 07/13/2020, 7:48 AM

## 2020-07-13 NOTE — Consult Note (Addendum)
ELECTROPHYSIOLOGY CONSULT NOTE    Patient ID: Tyler Richards MRN: [id number], DOB/AGE: 02-Aug-1951 69 y.o.  Admit date: 07/09/2020 Date of Consult: 07/13/2020  Primary Physician: Patient, No Pcp Per Primary Cardiologist: No primary care provider on file.  Electrophysiologist: Dr. Quentin Ore  Referring Provider: Dr. Letta Pate  Patient Profile: Tyler Richards is a 69 y.o. 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 Letta Pate.  HPI:  Tyler Richards is a 69 y.o. male with complicated medical history as above.   Last seen by EP team 07/10/2020. Tele at that time showed SB/NSR largely in the 50s with nocturnal rates in the 40s transiently. Noted to have 2 pauses of 4 seconds at 2045 and 0440, nocturnal. With no AV block noted.  Pt had been seen by Dr. Caryl Comes and Dr. Quentin Ore.   Started on midodrine and florinef during admission, and continued on admission to rehab.  Pt was also noted to have BRBPR, GI consulted and heparin and brilinta held. Pt only on ASA. No further BRPBR. Hgb 9.7 today.   Pt making gradual progress in rehab over weekend. This morning he reported feeling dizzy with therapy. Noted to have systolic BP in 660-630 range and HRs 40-50s.   He was standing with therapy and had near syncope. Orthostatics + as below.  Pts wife states he received an enema on admission and he had loose stools all weekend. He had loose stools "all day" yesterday and had to be changed multiple times. No BMET since 10/15.  108/72 and 113/63 supine HR 41 and 43 88/56  Sitting HR 48   Past Medical History:  Diagnosis Date    Coronary artery disease    Hypertension      Surgical History:  Past Surgical History:  Procedure Laterality Date   IR ANGIO INTRA EXTRACRAN SEL COM CAROTID INNOMINATE UNI L MOD SED  06/29/2020   IR CT HEAD LTD  06/29/2020   IR CT HEAD LTD  06/29/2020   IR INTRAVSC STENT CERV CAROTID W/O EMB-PROT MOD SED INC ANGIO  06/29/2020   IR PERCUTANEOUS ART THROMBECTOMY/INFUSION INTRACRANIAL INC DIAG ANGIO  06/29/2020   RADIOLOGY WITH ANESTHESIA N/A 06/29/2020   Procedure: IR WITH ANESTHESIA;  Surgeon: Radiologist, Medication, MD;  Location: [place];  Service: Radiology;  Laterality: N/A;     Medications Prior to Admission  Medication Sig Dispense Refill Last Dose   acetaminophen (TYLENOL) 325 MG tablet Take 650 mg by mouth every 6 (six) hours as needed for mild pain or headache.      aspirin EC 81 MG tablet Take 81 mg by mouth daily as needed for mild pain. Swallow whole.      atorvastatin (LIPITOR) 40 MG tablet Take 1 tablet (40 mg total) by mouth daily. 30 tablet 0    fludrocortisone (FLORINEF) 0.1 MG tablet Take 1 tablet (0.1 mg total) by mouth daily. 30 tablet 0    midodrine (PROAMATINE) 5 MG tablet Take 1 tablet (5 mg total) by mouth every 8 (eight) hours. 90 tablet 0    pantoprazole (PROTONIX) 40 MG tablet Take 1 tablet (40 mg total) by mouth daily. 30 tablet 0    [  EXPIRED] sulfamethoxazole-trimethoprim (BACTRIM DS) 800-160 MG tablet Take 1 tablet by mouth every 12 (twelve) hours for 3 days. 6 tablet 0     Inpatient Medications:   aspirin  81 mg Oral Daily   Or   aspirin  81 mg Per Tube Daily   atorvastatin  40 mg Oral Daily   baclofen  10 mg Oral QHS   colchicine  0.6 mg Oral BID   feeding supplement  237 mL Oral TID BM   fludrocortisone  0.1 mg Oral Daily   midodrine  5 mg Oral Q8H   pantoprazole  40 mg Oral Daily   polyethylene glycol  17 g Oral Daily   senna-docusate  1 tablet Oral BID    Allergies: No Known Allergies  Social History   Socioeconomic History   Marital status:  Married    Spouse name: Not on file   Number of children: Not on file   Years of education: Not on file   Highest education level: Not on file  Occupational History   Not on file  Tobacco Use   Smoking status: Never Smoker   Smokeless tobacco: Never Used  Substance and Sexual Activity   Alcohol use: Not Currently   Drug use: Never   Sexual activity: Not on file  Other Topics Concern   Not on file  Social History Narrative   Not on file   Social Determinants of Health   Financial Resource Strain:    Difficulty of Paying Living Expenses: Not on file  Food Insecurity:    Worried About Running Out of Food in the Last Year: Not on file   Ran Out of Food in the Last Year: Not on file  Transportation Needs:    Lack of Transportation (Medical): Not on file   Lack of Transportation (Non-Medical): Not on file  Physical Activity:    Days of Exercise per Week: Not on file   Minutes of Exercise per Session: Not on file  Stress:    Feeling of Stress : Not on file  Social Connections:    Frequency of Communication with Friends and Family: Not on file   Frequency of Social Gatherings with Friends and Family: Not on file   Attends Religious Services: Not on file   Active Member of Clubs or Organizations: Not on file   Attends Archivist Meetings: Not on file   Marital Status: Not on file  Intimate Partner Violence:    Fear of Current or Ex-Partner: Not on file   Emotionally Abused: Not on file   Physically Abused: Not on file   Sexually Abused: Not on file     History reviewed. No pertinent family history.   Review of Systems: All other systems reviewed and are otherwise negative except as noted above.  Physical Exam: Vitals:   07/12/20 1847 07/12/20 1911 07/13/20 0446 07/13/20 0600  BP: 116/70 (!) 106/53 112/69 (!) 112/55  Pulse: (!) 42 (!) 40 (!) 54 (!) 42  Resp: 16 16 17    Temp: 98.4 F (36.9 C) 98.4 F (36.9 C) 98.4 F (36.9 C)   TempSrc:      SpO2: 99%  97% 95% 98%  Weight:   78.4 kg   Height:        GEN- The patient is well appearing, alert and oriented x 3 today.   HEENT: normocephalic, atraumatic; sclera clear, conjunctiva pink; hearing intact; oropharynx clear; neck supple Lungs- Clear to ausculation bilaterally, normal work of breathing.  No wheezes,  rales, rhonchi Heart- Regular rate and rhythm, no murmurs, rubs or gallops GI- soft, non-tender, non-distended, bowel sounds present Extremities- no clubbing, cyanosis, or edema; DP/PT/radial pulses 2+ bilaterally MS- no significant deformity or atrophy Skin- warm and dry, no rash or lesion Psych- euthymic mood, full affect Neuro- strength and sensation are intact  Labs:   Lab Results  Component Value Date   WBC 5.2 07/13/2020   HGB 9.9 (L) 07/13/2020   HCT 29.5 (L) 07/13/2020   MCV 97.4 07/13/2020   PLT 644 (H) 07/13/2020    Recent Labs  Lab 07/10/20 0451  NA 137  K 4.0  CL 106  CO2 24  BUN 14  CREATININE 0.76  CALCIUM 8.1*  PROT 5.2*  BILITOT 0.6  ALKPHOS 114  ALT 49*  AST 45*  GLUCOSE 125*      Radiology/Studies: CT Code Stroke CTA Head W/WO contrast  Result Date: 06/29/2020 CLINICAL DATA:  Hyperdense vessel and stroke-like symptoms EXAM: CT ANGIOGRAPHY HEAD AND NECK CT PERFUSION BRAIN TECHNIQUE: Multidetector CT imaging of the head and neck was performed using the standard protocol during bolus administration of intravenous contrast. Multiplanar CT image reconstructions and MIPs were obtained to evaluate the vascular anatomy. Carotid stenosis measurements (when applicable) are obtained utilizing NASCET criteria, using the distal internal carotid diameter as the denominator. Multiphase CT imaging of the brain was performed following IV bolus contrast injection. Subsequent parametric perfusion maps were calculated using RAPID software. CONTRAST:  Dose is not yet known COMPARISON:  None. FINDINGS: CTA NECK FINDINGS Aortic arch: Mild atheromatous changes. Three vessel  branching. No acute finding. Right carotid system: Atherosclerotic plaque about the bifurcation with occluded ICA bulb. No flow seen within the ICA in the neck. Left carotid system: Atheromatous plaque at the bifurcation and bulb. No stenosis or ulceration. Negative for beading. Vertebral arteries: Low-density plaque at the left subclavian origin. No flow limiting subclavian stenosis. Plaque at the left vertebral origin without stenosis, beading, or dissection. Skeleton: Focal advanced C5-6 disc degeneration with ridging causing biforaminal impingement. Ordinary facet osteoarthritis. Other neck: No acute or aggressive finding. Right-sided chronic sinusitis with pattern middle meatus obstruction. Upper chest: Granulomatous nodal calcifications. There is also a calcified granuloma appearance in the left upper lobe. Review of the MIP images confirms the above findings CTA HEAD FINDINGS Anterior circulation: No flow seen in the right carotid or MCA branches. There is distal M2 M3 branch reconstitution. Calcified plaque along the left carotid siphon. No MCA branch occlusion, aneurysm, or beading. Posterior circulation: The vertebral and basilar arteries are smooth and widely patent. High-grade atheromatous narrowings of the bilateral PCA, P3 segment on the right and upper first order branch on the left. Venous sinuses: Patent as permitted by contrast timing Anatomic variants: None significant Review of the MIP images confirms the above findings CT Brain Perfusion Findings: ASPECTS: 9 CBF (<30%) Volume: 45mL Perfusion (Tmax>6.0s) volume: 178mL Mismatch Volume: 119mL Infarction Location:Right basal ganglia and lesser extensive lateral frontal cortex infarct. Critical Value/emergent results were called by telephone at the time of interpretation on 06/29/2020 at 9:37 am to provider ERIC [facility] , who verbally acknowledged these results. IMPRESSION: 1. Emergent large vessel occlusion with no flow seen in the right internal  carotid or proximal MCA. There is a 36 cc core infarct and 142 cc of penumbra by CT perfusion. 2. Atherosclerosis without flow limiting stenosis of the other major vessels. 3. Bilateral high-grade atheromatous PCA narrowings, more proximal on the right. 4. Incidental chronic right-sided sinusitis from middle  meatus obstruction. Electronically Signed   By: Monte Fantasia M.D.   On: 06/29/2020 09:53   CT HEAD WO CONTRAST  Result Date: 07/01/2020 CLINICAL DATA:  Follow-up intracranial hemorrhage EXAM: CT HEAD WITHOUT CONTRAST TECHNIQUE: Contiguous axial images were obtained from the base of the skull through the vertex without intravenous contrast. COMPARISON:  CT from 2 days ago FINDINGS: Brain: More contracted and homogeneous hematoma in the right basal ganglia measuring up to 2.8 x 2 cm. Cytotoxic edema from infarct has become apparent in the right basal ganglia and adjacent frontal insular cortex. No interval hemorrhage, hydrocephalus, or midline shift. Vascular: The right ICA is hyperdense below the skull base, unchanged. Skull: No acute or aggressive finding Sinuses/Orbits: Chronic right maxillary, ethmoid, and frontal sinusitis. Generalized mucosal thickening seen elsewhere, progressed. IMPRESSION: 1. Interval contraction of the right basal ganglia hematoma. 2. Expected evolution of the right frontoinsular infarcts. 3. 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 4. No new abnormality. Electronically Signed   By: Monte Fantasia M.D.   On: 07/01/2020 07:16   CT HEAD WO CONTRAST  Result Date: 06/29/2020 CLINICAL DATA:  Follow-up stroke and subsequent intervention. EXAM: CT HEAD WITHOUT CONTRAST TECHNIQUE: Contiguous axial images were obtained from the base of the skull through the vertex without intravenous contrast. COMPARISON:  Earlier same day FINDINGS: Brain: 3.9 x 3.0 x 3.0 cm hyperdense region in the right posterior basal ganglia most consistent with a parenchymal hematoma, given small  internal fluid levels. Mild mass effect and surrounding edema. No midline shift. Chronic ischemic changes in the right posterior frontal region as seen previously. Vascular: No other new vascular finding. Skull: Negative Sinuses/Orbits: Opacified right maxillary and ethmoid sinuses. Orbits negative. Other: None IMPRESSION: 1. 3.9 x 3.0 x 3.0 cm hyperdense region in the right posterior basal ganglia most consistent with parenchymal hematoma, given small internal fluid levels. Mild mass effect and surrounding edema. No midline shift. 2. Chronic ischemic changes in the right posterior frontal region as seen previously. 3. Opacified right maxillary and ethmoid sinuses. Electronically Signed   By: Nelson Chimes M.D.   On: 06/29/2020 18:48   CT Code Stroke CTA Neck W/WO contrast  Result Date: 06/29/2020 CLINICAL DATA:  Hyperdense vessel and stroke-like symptoms EXAM: CT ANGIOGRAPHY HEAD AND NECK CT PERFUSION BRAIN TECHNIQUE: Multidetector CT imaging of the head and neck was performed using the standard protocol during bolus administration of intravenous contrast. Multiplanar CT image reconstructions and MIPs were obtained to evaluate the vascular anatomy. Carotid stenosis measurements (when applicable) are obtained utilizing NASCET criteria, using the distal internal carotid diameter as the denominator. Multiphase CT imaging of the brain was performed following IV bolus contrast injection. Subsequent parametric perfusion maps were calculated using RAPID software. CONTRAST:  Dose is not yet known COMPARISON:  None. FINDINGS: CTA NECK FINDINGS Aortic arch: Mild atheromatous changes. Three vessel branching. No acute finding. Right carotid system: Atherosclerotic plaque about the bifurcation with occluded ICA bulb. No flow seen within the ICA in the neck. Left carotid system: Atheromatous plaque at the bifurcation and bulb. No stenosis or ulceration. Negative for beading. Vertebral arteries: Low-density plaque at the left  subclavian origin. No flow limiting subclavian stenosis. Plaque at the left vertebral origin without stenosis, beading, or dissection. Skeleton: Focal advanced C5-6 disc degeneration with ridging causing biforaminal impingement. Ordinary facet osteoarthritis. Other neck: No acute or aggressive finding. Right-sided chronic sinusitis with pattern middle meatus obstruction. Upper chest: Granulomatous nodal calcifications. There is also a calcified granuloma  appearance in the left upper lobe. Review of the MIP images confirms the above findings CTA HEAD FINDINGS Anterior circulation: No flow seen in the right carotid or MCA branches. There is distal M2 M3 branch reconstitution. Calcified plaque along the left carotid siphon. No MCA branch occlusion, aneurysm, or beading. Posterior circulation: The vertebral and basilar arteries are smooth and widely patent. High-grade atheromatous narrowings of the bilateral PCA, P3 segment on the right and upper first order branch on the left. Venous sinuses: Patent as permitted by contrast timing Anatomic variants: None significant Review of the MIP images confirms the above findings CT Brain Perfusion Findings: ASPECTS: 9 CBF (<30%) Volume: 39mL Perfusion (Tmax>6.0s) volume: 172mL Mismatch Volume: 135mL Infarction Location:Right basal ganglia and lesser extensive lateral frontal cortex infarct. Critical Value/emergent results were called by telephone at the time of interpretation on 06/29/2020 at 9:37 am to provider ERIC [facility] , who verbally acknowledged these results. IMPRESSION: 1. Emergent large vessel occlusion with no flow seen in the right internal carotid or proximal MCA. There is a 36 cc core infarct and 142 cc of penumbra by CT perfusion. 2. Atherosclerosis without flow limiting stenosis of the other major vessels. 3. Bilateral high-grade atheromatous PCA narrowings, more proximal on the right. 4. 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  Electronically Signed   By: Monte Fantasia M.D.   On: 06/29/2020 09:53   MR BRAIN WO CONTRAST  Result Date: 06/30/2020 CLINICAL DATA:  Stroke follow-up EXAM: MRI HEAD WITHOUT CONTRAST TECHNIQUE: Multiplanar, multiecho pulse sequences of the brain and surrounding structures were obtained without intravenous contrast. COMPARISON:  Head CT and CTA from yesterday FINDINGS: Brain: Acute infarction at the right stratum and patchy along the insula and lateral frontal cortex. The area of infarction appears similar to the area of pre treatment core infarct. Hematoma centered at the right putamen with dimensions better assessed by prior CT, grossly similar at to 3.5 cm anterior to posterior. Correlating to fluid fluid level, there is a posterior dense clot appearance and more heterogeneous anterior component. On gradient imaging petechial hemorrhage is seen along the right MCA watershed. No hydrocephalus or shift. Remote high right frontal cortex infarct anteriorly. Vascular: No flow voids seen in the left ICA beginning in the upper cervical spine. There is normalization by the supraclinoid segment. Skull and upper cervical spine: No focal marrow lesion Sinuses/Orbits: Right middle meatus obstruction with frontal, ethmoid, and especially maxillary sinusitis on the right. IMPRESSION: 1. Acute infarct at the right basal ganglia with nonprogressive hemorrhage when correlated with prior CT. Less extensive patchy acute cortical infarct in the right MCA territory. The areas of acute infarction correlate well with pretreatment CTP. 2. 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 Electronically Signed   By: Monte Fantasia M.D.   On: 06/30/2020 04:22   IR INTRAVSC STENT CERV CAROTID W/O EMB-PROT MOD SED  Result Date: 07/02/2020 INDICATION: New onset right gaze deviation, left hemiplegia, and left-sided neglect. EXAM: 1. EMERGENT LARGE VESSEL OCCLUSION THROMBOLYSIS anterior  CIRCULATION) COMPARISON:  CT angiogram of the head and neck of June 29, 2020. MEDICATIONS: Ancef 2 g IV was administered within 1 hour of the procedure. ANESTHESIA/SEDATION: General anesthesia CONTRAST:  Isovue 300 approximately 165 mL FLUOROSCOPY TIME:  Fluoroscopy Time: 97 minutes 0 seconds (3622 mGy). COMPLICATIONS: None immediate. TECHNIQUE: Following a full explanation of the procedure along with the potential associated complications, an informed witnessed consent was obtained from the spouse. The risks of  intracranial hemorrhage of 10%, worsening neurological deficit, ventilator dependency, death and inability to revascularize were all reviewed in detail with the patient's spouse. The patient was then put under general anesthesia by the Department of Anesthesiology at [facility]. The right groin was prepped and draped in the usual sterile fashion. Thereafter using modified Seldinger technique, transfemoral access into the right common femoral artery was obtained without difficulty. Over a 0.035 inch guidewire an 8 French 25 Pinnacle sheath was inserted. Through this, and also over a 0.035 inch guidewire a 5 French 125 cm Berenstein select inside of the balloon guide catheter combinationwas advanced to the aortic arch region and selectively positioned in the distal right common carotid artery. Guidewire, and the select catheter removed. Good aspiration obtained from the hub of the balloon guide catheter in the right common carotid artery. Arteriogram was then performed centered extra cranially and intracranially. FINDINGS: The right common carotid arteriogram demonstrates the right external carotid artery and its major branches to be widely patent. The right internal carotid artery at the bulb demonstrates a moderate sized plaque associated with complete occlusion just distal to the bulb. No evidence of distal angiographic string sign, or reconstitution of right internal carotid artery in the  cavernous segment was noted. PROCEDURE: Through the balloon guide catheter, a combination of an 021 Trevo ProVue microcatheter over a 0.014 inch standard Synchro micro guidewire with a J configuration, access through the occluded right internal carotid artery was obtained with the micro guidewire leading followed by the microcatheter. The combination was advanced to the horizontal petrous segment where the micro guidewire was removed. Poor aspiration was obtained from the microcatheter hub on account of the extensive clot in the entire right internal carotid artery intracranially and extra cranially. The microcatheter was then exchanged for a 014 inch Synchro standard 300 cm exchange micro guidewire with a J-tip configuration. A control arteriogram performed through the balloon guide demonstrated minimally improved caliber through the proximal right internal carotid artery. A 4 mm x 30 mm Viatrac 14 angioplasty balloon catheter which had been prepped with 50% contrast and 50% heparinized saline infusion and integrated with 75% contrast and 25% heparinized saline infusion was advanced over the exchange micro guidewire using the rapid exchange technique. The proximal and distal markers were then positioned in the described landing zones. A control inflation was then performed using micro inflation syringe device via micro tubing to 12 atmospheres. This was maintained for about 20 seconds and retrieved proximally. A control arteriogram performed through the balloon guide demonstrated improved caliber in the bulb but there continued be a high-grade stenosis in the proximal right internal carotid artery. This prompted a second angioplasty in the manner as described above again to 12 atmospheres for approximately 20 seconds. The balloon was then deflated and retrieved and removed. A control arteriogram performed through the balloon guide demonstrated significantly improved caliber and flow through the angioplastied  segment with improved flow to the more distal right internal carotid artery. However, there continued to be extensive clot noted within the internal carotid artery at the cranial skull base and the supraclinoid right ICA. An 021 Trevo ProVue catheter inside of a 071 134 cm aspiration catheter, combination was advanced over the exchange micro guidewire to the cavernous segment of the right internal carotid artery. Exchange micro guidewire was replaced with a regular 014 inch standard Synchro micro guidewire with a J configuration. This was then advanced using a torque device through the occluded right middle cerebral artery into one  of the inferior division branches in the M2 M3 region followed by the microcatheter. The guidewire was removed. Good aspiration obtained from the hub of the microcatheter. A gentle control arteriogram performed through this demonstrated slow flow through the vessel itself. A 4 mm x 40 mm Solitaire X retrieval device was then advanced to the distal end of the microcatheter. The retriever was deployed in the usual manner. The Zoom aspiration catheter was advanced to the mid right M1 segment. Continuous aspiration was then performed at the hub of the aspiration catheter for 2-1/2 minutes, with proximal flow arrest in the right internal carotid artery. The retrieval device, the microcatheter and the aspiration catheter were retrieved and removed. Copious amounts of clot were captured into the [place], and also in the retrieval device. Following reversal of flow arrest, a control arteriogram performed through the balloon guide catheter demonstrated now improved flow through the proximal right internal carotid artery extra cranially and intracranially. There is now patency of the right anterior cerebral artery with a stump of the occluded right middle cerebral artery. A second pass was then made again using the above combination. The microcatheter was now accessed into the superior division  of the right middle cerebral artery M2 M3 region over a 0.014 inch standard Synchro micro guidewire. Free aspiration of a blood was obtained at the hub of the microcatheter. A Tiger 21 retrieval device was then advanced to the distal end of the retrieval device. This was then deployed by retrieving the microcatheter. The proximal portion of the retrieval device was within the proximal aspect of the occluded right middle cerebral artery. Free aspiration was then started through the aspiration catheter with the Penumbra aspiration device which was positioned in the distal portion of the occluded clot. The retrieval device was then opened and closed until there was kinking at the proximal portion. The microcatheter was then advanced to the proximal marker on the retrieval device. With constant aspiration continued, the Tiger 21 retrieval device was then removed as constant aspiration was applied at the hub of the aspiration catheter, and also with proximal flow arrest and aspiration at the hub of the balloon guide with a 60 mL syringe. Multiple fragments of clot were obtained within the [place], and also entangled in the retrieval device. With reversal of the proximal flow arrest, a control arteriogram performed through the balloon guide in the right internal carotid artery now demonstrated improved opacification of the right middle cerebral artery to where there was now opacification of the anterior temporal branch. Distal branch remains occluded with no change in anterior cerebral artery. A third pass was now made using an 027 150 cm Marksman microcatheter which was advanced again using the combination of the 071 Zoom aspiration catheter over a 0.014 inch standard Synchro micro guidewire. This combination was advanced into the now the inferior division with the microcatheter advanced into the M2 region of the inferior division. The micro guidewire was removed. Good aspiration obtained from the hub of the 027  microcatheter. A gentle control arteriogram performed through this now demonstrated free flow into the distal vasculature. The aspiration catheter was engaged into the occluded distal right middle cerebral artery at the origin of the inferior division. Aspiration was now at the hub of the aspiration catheter embedded in the right middle cerebral artery clot and also at the hub of the Marksman catheter for approximately 2 minutes, with proximal flow arrest in the right internal carotid artery and constant aspiration at its hub.  The Marksman catheter was then gently retrieved while maintaining aspiration through the Zoom aspiration catheter. The Zoom aspiration catheter remained loft to aspiration. This was then retrieved and removed. Again clots were seen in the aspirate. Control arteriogram performed following reversal of flow arrest in the right internal carotid artery now demonstrated complete revascularization of the right MCA distribution, into the distal distribution achieving a TICI 2c revascularization. The right anterior cerebral artery remained widely patent. Measurements were now performed of the right internal carotid artery proximally, and the right common carotid artery distally at the proposed landing zones for the revascularization stent. An 021 Trevo ProVue microcatheter was advanced to the petrous segment of the right internal carotid artery over a 0.014 inch standard Synchro micro guidewire, and replaced with an 014 inch 300 cm Synchro exchange micro guidewire. Safe position of the tip of the exchange micro guidewire was verified. It was elected to proceed with placement of a 6/8 mm x 40 mm Xact stent across the diseased previously occluded right internal carotid artery proximally. This was retrogradely purged with heparinized saline infusion. Using the rapid exchange technique, the delivery system of the stent was then advanced and positioned covering distally, and also the proximal portion of the  landing zones. Stent was deployed in the usual manner without any difficulty. The delivery system was removed. Good aspiration obtained from the hub of the balloon guide in the right common carotid artery. A control arteriogram performed through this demonstrated excellent positioning and apposition of the stent with now free flow noted into the intracranial right ICA and the right middle and right anterior cerebral arteries being widely patent. Prior to this patient was loaded with 81 mg aspirin, and 180 mg of Brilinta via an orogastric tube. CT scan of the brain was then performed which demonstrated contrast blush of the right basal ganglia, and also to the 3 focal areas of hyperdensity suspicious for micro hemorrhages. 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 Three quarter bolus dose of Cangrelor was then given intravenously in order to salvage the occluding stent in the right internal carotid artery. The balloon guide was then removed following removal of the exchange micro guidewire. The diagnostic catheter was then positioned in the left common carotid artery. This demonstrated the left external carotid artery and the left internal carotid arteries to be widely patent. Patency of the left internal carotid to the cranial skull base was verified. Also the left middle and the left anterior cerebral arteries were seen to opacify into the capillary and venous phases. Also almost simultaneously cross-filling via the anterior communicating artery of the right anterior and the right middle cerebral arteries is noted via the anterior communicating artery. No gross filling defects were seen in the middle cerebral artery distribution. Another diagnostic arteriogram through the right common carotid artery demonstrated now complete  occlusion of the stented segment of the right internal carotid artery. The diagnostic catheter was removed. The 8 French Pinnacle sheath was then exchanged for a short 8 [place] sheath which in turn was then removed with the successful application of the 7 [place] ExoSeal closure device with hemostasis in the right groin. Distal pulses remained palpable in the feet bilaterally unchanged. A second CT scan of the brain demonstrated no significant change in the right basal ganglia with continued presence of at least 3 foci of hemorrhage.  No mass effect or midline shift was seen. Patient was left intubated on account of patient's medical condition and inability to respond appropriately to protect the airway. Patient was then transferred to the neuro ICU for post recanalization management. IMPRESSION: Status post endovascular complete revascularization of the right middle cerebral artery and the right anterior cerebral artery with 1 pass with a Solitaire 4 mm x 40 mm X retrieval device and aspiration, and 1 pass with the Tiger 21 retrieval device with proximal aspiration, and with 1 pass with contact aspiration achieving a TICI 2c revascularization of the right middle cerebral artery distribution. 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 Attempt to rescue with 3/4 bolus dose of Cangrelor IV. PLAN: Follow-up in the clinic 4 to 6 weeks post discharge. Electronically Signed   By: Luanne Bras M.D.   On: 06/30/2020 13:20   IR CT [facility]  Result Date: 07/02/2020 INDICATION: New onset right gaze deviation, left hemiplegia, and left-sided neglect. EXAM: 1. EMERGENT LARGE VESSEL OCCLUSION THROMBOLYSIS anterior CIRCULATION) COMPARISON:  CT angiogram of the head and neck of June 29, 2020. MEDICATIONS: Ancef 2 g IV was administered within 1 hour of the  procedure. ANESTHESIA/SEDATION: General anesthesia CONTRAST:  Isovue 300 approximately 165 mL FLUOROSCOPY TIME:  Fluoroscopy Time: 97 minutes 0 seconds (3622 mGy). COMPLICATIONS: None immediate. TECHNIQUE: Following a full explanation of the procedure along with the potential associated complications, an informed witnessed consent was obtained from the spouse. The risks of intracranial hemorrhage of 10%, worsening neurological deficit, ventilator dependency, death and inability to revascularize were all reviewed in detail with the patient's spouse. The patient was then put under general anesthesia by the Department of Anesthesiology at [facility]. The right groin was prepped and draped in the usual sterile fashion. Thereafter using modified Seldinger technique, transfemoral access into the right common femoral artery was obtained without difficulty. Over a 0.035 inch guidewire an 8 French 25 Pinnacle sheath was inserted. Through this, and also over a 0.035 inch guidewire a 5 French 125 cm Berenstein select inside of the balloon guide catheter combinationwas advanced to the aortic arch region and selectively positioned in the distal right common carotid artery. Guidewire, and the select catheter removed. Good aspiration obtained from the hub of the balloon guide catheter in the right common carotid artery. Arteriogram was then performed centered extra cranially and intracranially. FINDINGS: The right common carotid arteriogram demonstrates the right external carotid artery and its major branches to be widely patent. The right internal carotid artery at the bulb demonstrates a moderate sized plaque associated with complete occlusion just distal to the bulb. No evidence of distal angiographic string sign, or reconstitution of right internal carotid artery in the cavernous segment was noted. PROCEDURE: Through the balloon guide catheter, a combination of an 021 Trevo ProVue microcatheter over a 0.014 inch  standard Synchro micro guidewire with a J configuration, access through the occluded right internal carotid artery was obtained with the micro guidewire leading followed by the microcatheter. The combination was advanced to the horizontal petrous segment where the micro guidewire was removed. Poor aspiration was obtained from the microcatheter hub on account of the extensive clot in the entire right internal carotid artery intracranially and extra cranially. The microcatheter was then exchanged for a 014 inch Synchro standard 300 cm exchange micro guidewire with a J-tip configuration. A control arteriogram performed through the balloon guide  demonstrated minimally improved caliber through the proximal right internal carotid artery. A 4 mm x 30 mm Viatrac 14 angioplasty balloon catheter which had been prepped with 50% contrast and 50% heparinized saline infusion and integrated with 75% contrast and 25% heparinized saline infusion was advanced over the exchange micro guidewire using the rapid exchange technique. The proximal and distal markers were then positioned in the described landing zones. A control inflation was then performed using micro inflation syringe device via micro tubing to 12 atmospheres. This was maintained for about 20 seconds and retrieved proximally. A control arteriogram performed through the balloon guide demonstrated improved caliber in the bulb but there continued be a high-grade stenosis in the proximal right internal carotid artery. This prompted a second angioplasty in the manner as described above again to 12 atmospheres for approximately 20 seconds. The balloon was then deflated and retrieved and removed. A control arteriogram performed through the balloon guide demonstrated significantly improved caliber and flow through the angioplastied segment with improved flow to the more distal right internal carotid artery. However, there continued to be extensive clot noted within the internal  carotid artery at the cranial skull base and the supraclinoid right ICA. An 021 Trevo ProVue catheter inside of a 071 134 cm aspiration catheter, combination was advanced over the exchange micro guidewire to the cavernous segment of the right internal carotid artery. Exchange micro guidewire was replaced with a regular 014 inch standard Synchro micro guidewire with a J configuration. This was then advanced using a torque device through the occluded right middle cerebral artery into one of the inferior division branches in the M2 M3 region followed by the microcatheter. The guidewire was removed. Good aspiration obtained from the hub of the microcatheter. A gentle control arteriogram performed through this demonstrated slow flow through the vessel itself. A 4 mm x 40 mm Solitaire X retrieval device was then advanced to the distal end of the microcatheter. The retriever was deployed in the usual manner. The Zoom aspiration catheter was advanced to the mid right M1 segment. Continuous aspiration was then performed at the hub of the aspiration catheter for 2-1/2 minutes, with proximal flow arrest in the right internal carotid artery. The retrieval device, the microcatheter and the aspiration catheter were retrieved and removed. Copious amounts of clot were captured into the [place], and also in the retrieval device. Following reversal of flow arrest, a control arteriogram performed through the balloon guide catheter demonstrated now improved flow through the proximal right internal carotid artery extra cranially and intracranially. There is now patency of the right anterior cerebral artery with a stump of the occluded right middle cerebral artery. A second pass was then made again using the above combination. The microcatheter was now accessed into the superior division of the right middle cerebral artery M2 M3 region over a 0.014 inch standard Synchro micro guidewire. Free aspiration of a blood was obtained at the  hub of the microcatheter. A Tiger 21 retrieval device was then advanced to the distal end of the retrieval device. This was then deployed by retrieving the microcatheter. The proximal portion of the retrieval device was within the proximal aspect of the occluded right middle cerebral artery. Free aspiration was then started through the aspiration catheter with the Penumb[place] aspiration device which was positioned in the distal portion of the occluded clot. The retrieval device was then opened and closed until there was kinking at the proximal portion. The microcatheter was then advanced to the proximal marker on  the retrieval device. With constant aspiration continued, the Tiger 21 retrieval device was then removed as constant aspiration was applied at the hub of the aspiration catheter, and also with proximal flow arrest and aspiration at the hub of the balloon guide with a 60 mL syringe. Multiple fragments of clot were obtained within the [place], and also entangled in the retrieval device. With reversal of the proximal flow arrest, a control arteriogram performed through the balloon guide in the right internal carotid artery now demonstrated improved opacification of the right middle cerebral artery to where there was now opacification of the anterior temporal branch. Distal branch remains occluded with no change in anterior cerebral artery. A third pass was now made using an 027 150 cm Marksman microcatheter which was advanced again using the combination of the 071 Zoom aspiration catheter over a 0.014 inch standard Synchro micro guidewire. This combination was advanced into the now the inferior division with the microcatheter advanced into the M2 region of the inferior division. The micro guidewire was removed. Good aspiration obtained from the hub of the 027 microcatheter. A gentle control arteriogram performed through this now demonstrated free flow into the distal vasculature. The aspiration catheter was  engaged into the occluded distal right middle cerebral artery at the origin of the inferior division. Aspiration was now at the hub of the aspiration catheter embedded in the right middle cerebral artery clot and also at the hub of the Marksman catheter for approximately 2 minutes, with proximal flow arrest in the right internal carotid artery and constant aspiration at its hub. The Marksman catheter was then gently retrieved while maintaining aspiration through the Zoom aspiration catheter. The Zoom aspiration catheter remained loft to aspiration. This was then retrieved and removed. Again clots were seen in the aspirate. Control arteriogram performed following reversal of flow arrest in the right internal carotid artery now demonstrated complete revascularization of the right MCA distribution, into the distal distribution achieving a TICI 2c revascularization. The right anterior cerebral artery remained widely patent. Measurements were now performed of the right internal carotid artery proximally, and the right common carotid artery distally at the proposed landing zones for the revascularization stent. An 021 Trevo ProVue microcatheter was advanced to the petrous segment of the right internal carotid artery over a 0.014 inch standard Synchro micro guidewire, and replaced with an 014 inch 300 cm Synchro exchange micro guidewire. Safe position of the tip of the exchange micro guidewire was verified. It was elected to proceed with placement of a 6/8 mm x 40 mm Xact stent across the diseased previously occluded right internal carotid artery proximally. This was retrogradely purged with heparinized saline infusion. Using the rapid exchange technique, the delivery system of the stent was then advanced and positioned covering distally, and also the proximal portion of the landing zones. Stent was deployed in the usual manner without any difficulty. The delivery system was removed. Good aspiration obtained from the hub  of the balloon guide in the right common carotid artery. A control arteriogram performed through this demonstrated excellent positioning and apposition of the stent with now free flow noted into the intracranial right ICA and the right middle and right anterior cerebral arteries being widely patent. Prior to this patient was loaded with 81 mg aspirin, and 180 mg of Brilinta via an orogastric tube. CT scan of the brain was then performed which demonstrated contrast blush of the right basal ganglia, and also to the 3 focal areas of hyperdensity suspicious for micro  hemorrhages. 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 Three quarter bolus dose of Cangrelor was then given intravenously in order to salvage the occluding stent in the right internal carotid artery. The balloon guide was then removed following removal of the exchange micro guidewire. The diagnostic catheter was then positioned in the left common carotid artery. This demonstrated the left external carotid artery and the left internal carotid arteries to be widely patent. Patency of the left internal carotid to the cranial skull base was verified. Also the left middle and the left anterior cerebral arteries were seen to opacify into the capillary and venous phases. Also almost simultaneously cross-filling via the anterior communicating artery of the right anterior and the right middle cerebral arteries is noted via the anterior communicating artery. No gross filling defects were seen in the middle cerebral artery distribution. 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 The diagnostic catheter was removed. The 8 French Pinnacle sheath was then  exchanged for a short 8 [place] sheath which in turn was then removed with the successful application of the 7 [place] ExoSeal closure device with hemostasis in the right groin. Distal pulses remained palpable in the feet bilaterally unchanged. A second CT scan of the brain demonstrated no significant change in the right basal ganglia with continued presence of at least 3 foci of hemorrhage. No mass effect or midline shift was seen. Patient was left intubated on account of patient's medical condition and inability to respond appropriately to protect the airway. Patient was then transferred to the neuro ICU for post recanalization management. IMPRESSION: Status post endovascular complete revascularization of the right middle cerebral artery and the right anterior cerebral artery with 1 pass with a Solitaire 4 mm x 40 mm X retrieval device and aspiration, and 1 pass with the Tiger 21 retrieval device with proximal aspiration, and with 1 pass with contact aspiration achieving a TICI 2c revascularization of the right middle cerebral artery distribution. 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 Attempt to rescue with 3/4 bolus dose of Cangrelor IV. PLAN: Follow-up in the clinic 4 to 6 weeks post discharge. Electronically Signed   By: Luanne Bras M.D.   On: 06/30/2020 13:20   IR CT Head [facility]  Result Date: 07/02/2020 INDICATION: New onset right gaze deviation, left hemiplegia, and left-sided neglect. EXAM: 1. EMERGENT LARGE VESSEL OCCLUSION THROMBOLYSIS anterior CIRCULATION) COMPARISON:  CT angiogram of the head and neck of June 29, 2020. MEDICATIONS: Ancef 2 g IV was administered within 1 hour of the procedure. ANESTHESIA/SEDATION: General anesthesia CONTRAST:  Isovue 300 approximately 165 mL FLUOROSCOPY TIME:  Fluoroscopy Time: 97 minutes 0  seconds (3622 mGy). COMPLICATIONS: None immediate. TECHNIQUE: Following a full explanation of the procedure along with the potential associated complications, an informed witnessed consent was obtained from the spouse. The risks of intracranial hemorrhage of 10%, worsening neurological deficit, ventilator dependency, death and inability to revascularize were all reviewed in detail with the patient's spouse. The patient was then put under general anesthesia by the Department of Anesthesiology at [facility]. The right groin was prepped and draped in the usual sterile fashion. Thereafter using modified Seldinger technique, transfemoral  access into the right common femoral artery was obtained without difficulty. Over a 0.035 inch guidewire an 8 French 25 Pinnacle sheath was inserted. Through this, and also over a 0.035 inch guidewire a 5 French 125 cm Berenstein select inside of the balloon guide catheter combinationwas advanced to the aortic arch region and selectively positioned in the distal right common carotid artery. Guidewire, and the select catheter removed. Good aspiration obtained from the hub of the balloon guide catheter in the right common carotid artery. Arteriogram was then performed centered extra cranially and intracranially. FINDINGS: The right common carotid arteriogram demonstrates the right external carotid artery and its major branches to be widely patent. The right internal carotid artery at the bulb demonstrates a moderate sized plaque associated with complete occlusion just distal to the bulb. No evidence of distal angiographic string sign, or reconstitution of right internal carotid artery in the cavernous segment was noted. PROCEDURE: Through the balloon guide catheter, a combination of an 021 Trevo ProVue microcatheter over a 0.014 inch standard Synchro micro guidewire with a J configuration, access through the occluded right internal carotid artery was obtained with the micro  guidewire leading followed by the microcatheter. The combination was advanced to the horizontal petrous segment where the micro guidewire was removed. Poor aspiration was obtained from the microcatheter hub on account of the extensive clot in the entire right internal carotid artery intracranially and extra cranially. The microcatheter was then exchanged for a 014 inch Synchro standard 300 cm exchange micro guidewire with a J-tip configuration. A control arteriogram performed through the balloon guide demonstrated minimally improved caliber through the proximal right internal carotid artery. A 4 mm x 30 mm Viatrac 14 angioplasty balloon catheter which had been prepped with 50% contrast and 50% heparinized saline infusion and integrated with 75% contrast and 25% heparinized saline infusion was advanced over the exchange micro guidewire using the rapid exchange technique. The proximal and distal markers were then positioned in the described landing zones. A control inflation was then performed using micro inflation syringe device via micro tubing to 12 atmospheres. This was maintained for about 20 seconds and retrieved proximally. A control arteriogram performed through the balloon guide demonstrated improved caliber in the bulb but there continued be a high-grade stenosis in the proximal right internal carotid artery. This prompted a second angioplasty in the manner as described above again to 12 atmospheres for approximately 20 seconds. The balloon was then deflated and retrieved and removed. A control arteriogram performed through the balloon guide demonstrated significantly improved caliber and flow through the angioplastied segment with improved flow to the more distal right internal carotid artery. However, there continued to be extensive clot noted within the internal carotid artery at the cranial skull base and the supraclinoid right ICA. An 021 Trevo ProVue catheter inside of a 071 134 cm aspiration catheter,  combination was advanced over the exchange micro guidewire to the cavernous segment of the right internal carotid artery. Exchange micro guidewire was replaced with a regular 014 inch standard Synchro micro guidewire with a J configuration. This was then advanced using a torque device through the occluded right middle cerebral artery into one of the inferior division branches in the M2 M3 region followed by the microcatheter. The guidewire was removed. Good aspiration obtained from the hub of the microcatheter. A gentle control arteriogram performed through this demonstrated slow flow through the vessel itself. A 4 mm x 40 mm Solitaire X retrieval device was then advanced to the distal end  of the microcatheter. The retriever was deployed in the usual manner. The Zoom aspiration catheter was advanced to the mid right M1 segment. Continuous aspiration was then performed at the hub of the aspiration catheter for 2-1/2 minutes, with proximal flow arrest in the right internal carotid artery. The retrieval device, the microcatheter and the aspiration catheter were retrieved and removed. Copious amounts of clot were captured into the [place], and also in the retrieval device. Following reversal of flow arrest, a control arteriogram performed through the balloon guide catheter demonstrated now improved flow through the proximal right internal carotid artery extra cranially and intracranially. There is now patency of the right anterior cerebral artery with a stump of the occluded right middle cerebral artery. A second pass was then made again using the above combination. The microcatheter was now accessed into the superior division of the right middle cerebral artery M2 M3 region over a 0.014 inch standard Synchro micro guidewire. Free aspiration of a blood was obtained at the hub of the microcatheter. A Tiger 21 retrieval device was then advanced to the distal end of the retrieval device. This was then deployed by  retrieving the microcatheter. The proximal portion of the retrieval device was within the proximal aspect of the occluded right middle cerebral artery. Free aspiration was then started through the aspiration catheter with the Penumbra aspiration device which was positioned in the distal portion of the occluded clot. The retrieval device was then opened and closed until there was kinking at the proximal portion. The microcatheter was then advanced to the proximal marker on the retrieval device. With constant aspiration continued, the Tiger 21 retrieval device was then removed as constant aspiration was applied at the hub of the aspiration catheter, and also with proximal flow arrest and aspiration at the hub of the balloon guide with a 60 mL syringe. Multiple fragments of clot were obtained within the [place], and also entangled in the retrieval device. With reversal of the proximal flow arrest, a control arteriogram performed through the balloon guide in the right internal carotid artery now demonstrated improved opacification of the right middle cerebral artery to where there was now opacification of the anterior temporal branch. Distal branch remains occluded with no change in anterior cerebral artery. A third pass was now made using an 027 150 cm Marksman microcatheter which was advanced again using the combination of the 071 Zoom aspiration catheter over a 0.014 inch standard Synchro micro guidewire. This combination was advanced into the now the inferior division with the microcatheter advanced into the M2 region of the inferior division. The micro guidewire was removed. Good aspiration obtained from the hub of the 027 microcatheter. A gentle control arteriogram performed through this now demonstrated free flow into the distal vasculature. The aspiration catheter was engaged into the occluded distal right middle cerebral artery at the origin of the inferior division. Aspiration was now at the hub of the  aspiration catheter embedded in the right middle cerebral artery clot and also at the hub of the Marksman catheter for approximately 2 minutes, with proximal flow arrest in the right internal carotid artery and constant aspiration at its hub. The Marksman catheter was then gently retrieved while maintaining aspiration through the Zoom aspiration catheter. The Zoom aspiration catheter remained loft to aspiration. This was then retrieved and removed. Again clots were seen in the aspirate. Control arteriogram performed following reversal of flow arrest in the right internal carotid artery now demonstrated complete revascularization of the right MCA  distribution, into the distal distribution achieving a TICI 2c revascularization. The right anterior cerebral artery remained widely patent. Measurements were now performed of the right internal carotid artery proximally, and the right common carotid artery distally at the proposed landing zones for the revascularization stent. An 021 Trevo ProVue microcatheter was advanced to the petrous segment of the right internal carotid artery over a 0.014 inch standard Synchro micro guidewire, and replaced with an 014 inch 300 cm Synchro exchange micro guidewire. Safe position of the tip of the exchange micro guidewire was verified. It was elected to proceed with placement of a 6/8 mm x 40 mm Xact stent across the diseased previously occluded right internal carotid artery proximally. This was retrogradely purged with heparinized saline infusion. Using the rapid exchange technique, the delivery system of the stent was then advanced and positioned covering distally, and also the proximal portion of the landing zones. Stent was deployed in the usual manner without any difficulty. The delivery system was removed. Good aspiration obtained from the hub of the balloon guide in the right common carotid artery. A control arteriogram performed through this demonstrated excellent positioning and  apposition of the stent with now free flow noted into the intracranial right ICA and the right middle and right anterior cerebral arteries being widely patent. Prior to this patient was loaded with 81 mg aspirin, and 180 mg of Brilinta via an orogastric tube. CT scan of the brain was then performed which demonstrated contrast blush of the right basal ganglia, and also to the 3 focal areas of hyperdensity suspicious for micro hemorrhages. 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 Three quarter bolus dose of Cangrelor was then given intravenously in order to salvage the occluding stent in the right internal carotid artery. The balloon guide was then removed following removal of the exchange micro guidewire. The diagnostic catheter was then positioned in the left common carotid artery. This demonstrated the left external carotid artery and the left internal carotid arteries to be widely patent. Patency of the left internal carotid to the cranial skull base was verified. Also the left middle and the left anterior cerebral arteries were seen to opacify into the capillary and venous phases. Also almost simultaneously cross-filling via the anterior communicating artery of the right anterior and the right middle cerebral arteries is noted via the anterior communicating artery. No gross filling defects were seen in the middle cerebral artery distribution. 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 The diagnostic catheter was removed. The 8 French Pinnacle sheath was then exchanged for a short 8 [place] sheath which in turn was then removed with the successful application of the 7 [place] ExoSeal closure device with  hemostasis in the right groin. Distal pulses remained palpable in the feet bilaterally unchanged. A second CT scan of the brain demonstrated no significant change in the right basal ganglia with continued presence of at least 3 foci of hemorrhage. No mass effect or midline shift was seen. Patient was left intubated on account of patient's medical condition and inability to respond appropriately to protect the airway. Patient was then transferred to the neuro ICU for post recanalization management. IMPRESSION: Status post endovascular complete revascularization of the right middle cerebral artery and the right anterior cerebral artery with  1 pass with a Solitaire 4 mm x 40 mm X retrieval device and aspiration, and 1 pass with the Tiger 21 retrieval device with proximal aspiration, and with 1 pass with contact aspiration achieving a TICI 2c revascularization of the right middle cerebral artery distribution. 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 Attempt to rescue with 3/4 bolus dose of Cangrelor IV. PLAN: Follow-up in the clinic 4 to 6 weeks post discharge. Electronically Signed   By: Luanne Bras M.D.   On: 06/30/2020 13:20   CT Code Stroke Cerebral Perfusion with contrast  Result Date: 06/29/2020 CLINICAL DATA:  Hyperdense vessel and stroke-like symptoms EXAM: CT ANGIOGRAPHY HEAD AND NECK CT PERFUSION BRAIN TECHNIQUE: Multidetector CT imaging of the head and neck was performed using the standard protocol during bolus administration of intravenous contrast. Multiplanar CT image reconstructions and MIPs were obtained to evaluate the vascular anatomy. Carotid stenosis measurements (when applicable) are obtained utilizing NASCET criteria, using the distal internal carotid diameter as the denominator. Multiphase CT imaging of the brain was  performed following IV bolus contrast injection. Subsequent parametric perfusion maps were calculated using RAPID software. CONTRAST:  Dose is not yet known COMPARISON:  None. FINDINGS: CTA NECK FINDINGS Aortic arch: Mild atheromatous changes. Three vessel branching. No acute finding. Right carotid system: Atherosclerotic plaque about the bifurcation with occluded ICA bulb. No flow seen within the ICA in the neck. Left carotid system: Atheromatous plaque at the bifurcation and bulb. No stenosis or ulceration. Negative for beading. Vertebral arteries: Low-density plaque at the left subclavian origin. No flow limiting subclavian stenosis. Plaque at the left vertebral origin without stenosis, beading, or dissection. Skeleton: Focal advanced C5-6 disc degeneration with ridging causing biforaminal impingement. Ordinary facet osteoarthritis. Other neck: No acute or aggressive finding. Right-sided chronic sinusitis with pattern middle meatus obstruction. Upper chest: Granulomatous nodal calcifications. There is also a calcified granuloma appearance in the left upper lobe. Review of the MIP images confirms the above findings CTA HEAD FINDINGS Anterior circulation: No flow seen in the right carotid or MCA branches. There is distal M2 M3 branch reconstitution. Calcified plaque along the left carotid siphon. No MCA branch occlusion, aneurysm, or beading. Posterior circulation: The vertebral and basilar arteries are smooth and widely patent. High-grade atheromatous narrowings of the bilateral PCA, P3 segment on the right and upper first order branch on the left. Venous sinuses: Patent as permitted by contrast timing Anatomic variants: None significant Review of the MIP images confirms the above findings CT Brain Perfusion Findings: ASPECTS: 9 CBF (<30%) Volume: 23mL Perfusion (Tmax>6.0s) volume: 176mL Mismatch Volume: 138mL Infarction Location:Right basal ganglia and lesser extensive lateral frontal cortex infarct. Critical  Value/emergent results were called by telephone at the time of interpretation on 06/29/2020 at 9:37 am to provider ERIC [facility] , who verbally acknowledged these results. IMPRESSION: 1. Emergent large vessel occlusion with no flow seen in the right internal carotid or proximal MCA. There is a 36 cc core infarct and 142 cc of penumbra by CT perfusion. 2. Atherosclerosis without flow limiting stenosis of the other major vessels. 3. Bilateral high-grade atheromatous PCA narrowings, more proximal on the right. 4. 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 Electronically Signed   By: Monte Fantasia M.D.   On: 06/29/2020 09:53   DG CHEST PORT 1 VIEW  Result Date: 07/06/2020 CLINICAL DATA:  Fever EXAM: PORTABLE CHEST 1 VIEW COMPARISON:  June 30, 2020 FINDINGS: Endotracheal tube and nasogastric tube no longer present. There is no edema or airspace opacity. Heart size is upper normal with pulmonary vascularity normal. No adenopathy. No bone lesions. IMPRESSION: No edema or airspace opacity.  Heart upper normal in size. Electronically Signed   By: Lowella Grip III M.D.   On: 07/06/2020 10:57   Portable Chest xray  Result Date: 06/30/2020 CLINICAL DATA:  Respiratory failure.  Stroke. EXAM: PORTABLE CHEST 1 VIEW COMPARISON:  06/29/2020 FINDINGS: 0538 hours. Endotracheal tube tip is 7.4 cm above the base of the carina. The lungs are clear without focal pneumonia, edema, pneumothorax or pleural effusion. The cardiopericardial silhouette is within normal limits for size. The visualized bony structures of the thorax show no acute abnormality. Telemetry leads overlie the chest. IMPRESSION: No acute cardiopulmonary findings. Electronically Signed   By: Misty Stanley M.D.   On: 06/30/2020 07:30   DG CHEST PORT 1 VIEW  Result Date: 06/29/2020 CLINICAL DATA:  Code stroke with coiling intubated EXAM: PORTABLE CHEST 1 VIEW COMPARISON:  None. FINDINGS: Endotracheal tube tip is about 4.2 cm  superior to the carina. Esophageal tube tip is below the diaphragm but incompletely visualized. No focal opacity or pleural effusion. Normal cardiac size. No pneumothorax. IMPRESSION: Endotracheal tube tip about 4.2 cm superior to the carina. Lungs grossly clear Electronically Signed   By: Donavan Foil M.D.   On: 06/29/2020 16:00   DG Swallowing Func-Speech Pathology  Result Date: 07/01/2020 Objective Swallowing Evaluation: Type of Study: Bedside Swallow Evaluation  Patient Details Name: Tyler Richards MRN: [id number] Date of Birth: [birth year] Today's Date: 07/01/2020 Time: SLP Start Time (ACUTE ONLY): 1252 -SLP Stop Time (ACUTE ONLY): 1310 SLP Time Calculation (min) (ACUTE ONLY): 18 min Past Medical History: No past medical history on file. Past Surgical History: Past Surgical History: Procedure Laterality Date  RADIOLOGY WITH ANESTHESIA N/A 06/29/2020  Procedure: IR WITH ANESTHESIA;  Surgeon: Radiologist, Medication, MD;  Location: [place];  Service: Radiology;  Laterality: N/A; HPI: Patient is a 69 y/o male who presents with left sided weakness, right gaze and slurred speech. NIH:17. Head CT- dense Right MCA infarct. CTA- Right ICA and MCA occlusions. s/p right MCA thrombectomy and right ICA stent placement 06/29/20 with post procedure hemmorhage. ETT 10/4-10/5. No PMH.  Subjective: alert, cooperative Assessment / Plan / Recommendation CHL IP CLINICAL IMPRESSIONS 07/01/2020 Clinical Impression Pt has an oral more than pharyngeal dysphagia, with oral preparation and transit impacted by reduced strength and suspect sensation as well. He has intermittent trouble getting liquids via cup or straw, with reduced seal and sucking. There is anterior spillage with thin liquids; L buccal pocketing with more solid consistencies and slower posterior transit. Mild premature spillage occurs with thin liquids. He has relatively improved pharyngeal function with only mildly reduced base of tongue retraction that results in trace-mild  vallecular residue with purees and mild residue with solids. Recommend starting Dys 1 diet and thin liquids. Will f/u for advancement of solids clinically.  SLP Visit Diagnosis Dysphagia, oropharyngeal phase (R13.12) Attention and concentration deficit following -- Frontal lobe and executive function deficit following -- Impact on safety and function Mild aspiration risk   CHL IP TREATMENT RECOMMENDATION 07/01/2020 Treatment Recommendations Therapy as outlined in treatment plan below   Prognosis 07/01/2020 Prognosis for Safe Diet Advancement Good Barriers to Reach Goals -- Barriers/Prognosis Comment -- CHL IP DIET RECOMMENDATION 07/01/2020 SLP Diet Recommendations Dysphagia 1 (Puree) solids;Thin liquid Liquid Administration via Straw;Cup Medication Administration Crushed with puree  Compensations Minimize environmental distractions;Slow rate;Small sips/bites;Lingual sweep for clearance of pocketing Postural Changes Seated upright at 90 degrees   CHL IP OTHER RECOMMENDATIONS 07/01/2020 Recommended Consults -- Oral Care Recommendations Oral care before and after PO Other Recommendations Have oral suction available   CHL IP FOLLOW UP RECOMMENDATIONS 07/01/2020 Follow up Recommendations Inpatient Rehab   CHL IP FREQUENCY AND DURATION 07/01/2020 Speech Therapy Frequency (ACUTE ONLY) min 2x/week Treatment Duration 2 weeks      CHL IP ORAL PHASE 07/01/2020 Oral Phase Impaired Oral - Pudding Teaspoon -- Oral - Pudding Cup -- Oral - Honey Teaspoon -- Oral - Honey Cup -- Oral - Nectar Teaspoon -- Oral - Nectar Cup -- Oral - Nectar Straw -- Oral - Thin Teaspoon -- Oral - Thin Cup Left anterior bolus loss;Weak lingual manipulation;Decreased bolus cohesion;Premature spillage Oral - Thin Straw Weak lingual manipulation;Decreased bolus cohesion;Premature spillage Oral - Puree Weak lingual manipulation;Decreased bolus cohesion;Premature spillage Oral - Mech Soft Weak lingual manipulation;Decreased bolus cohesion;Premature spillage;Left  pocketing in lateral sulci Oral - Regular -- Oral - Multi-Consistency -- Oral - Pill -- Oral Phase - Comment --  CHL IP PHARYNGEAL PHASE 07/01/2020 Pharyngeal Phase Impaired Pharyngeal- Pudding Teaspoon -- Pharyngeal -- Pharyngeal- Pudding Cup -- Pharyngeal -- Pharyngeal- Honey Teaspoon -- Pharyngeal -- Pharyngeal- Honey Cup -- Pharyngeal -- Pharyngeal- Nectar Teaspoon -- Pharyngeal -- Pharyngeal- Nectar Cup -- Pharyngeal -- Pharyngeal- Nectar Straw -- Pharyngeal -- Pharyngeal- Thin Teaspoon -- Pharyngeal -- Pharyngeal- Thin Cup Delayed swallow initiation-pyriform sinuses;Reduced tongue base retraction Pharyngeal -- Pharyngeal- Thin Straw Delayed swallow initiation-pyriform sinuses;Reduced tongue base retraction Pharyngeal -- Pharyngeal- Puree Reduced tongue base retraction;Pharyngeal residue - valleculae Pharyngeal -- Pharyngeal- Mechanical Soft Reduced tongue base retraction;Pharyngeal residue - valleculae Pharyngeal -- Pharyngeal- Regular -- Pharyngeal -- Pharyngeal- Multi-consistency -- Pharyngeal -- Pharyngeal- Pill -- Pharyngeal -- Pharyngeal Comment --  CHL IP CERVICAL ESOPHAGEAL PHASE 07/01/2020 Cervical Esophageal Phase WFL Pudding Teaspoon -- Pudding Cup -- Honey Teaspoon -- Honey Cup -- Nectar Teaspoon -- Nectar Cup -- Nectar Straw -- Thin Teaspoon -- Thin Cup -- Thin Straw -- Puree -- Mechanical Soft -- Regular -- Multi-consistency -- Pill -- Cervical Esophageal Comment -- [name], M.A. [place] [facility] Pager [phone] Office [phone] 07/01/2020, 2:21 PM              EEG adult  Result Date: 07/02/2020 Lora Havens, MD     07/02/2020  6:23 PM Patient Name: Tyler Richards MRN: [id number] Epilepsy Attending: Lora Havens Referring Physician/Provider: Dr Rosalin Hawking Date: 07/02/2020 Duration: 24.26 mins Patient history: 69 y.o. male with history of gout admitted for left-sided weakness, right gaze, left facial droop with fall. MRI showed acute infarct at the right basal  ganglia with nonprogressive hemorrhage as well as less extensive patchy acute cortical infarct in the right MCA territory EEG to evaluate for seizure. Level of alertness: Awake, asleep AEDs during EEG study: None Technical aspects: This EEG study was done with scalp electrodes positioned according to the 10-20 International system of electrode placement. Electrical activity was acquired at a sampling rate of 500Hz  and reviewed with a high frequency filter of 70Hz  and a low frequency filter of 1Hz . EEG data were recorded continuously and digitally stored. Description: The posterior dominant rhythm consists of 8 Hz activity of moderate voltage (25-35 uV) seen predominantly in posterior head regions, asymmetric ( R<L) and reactive to eye opening and eye closing. Sleep was characterized by vertex waves, sleep spindles (12 to 14 Hz), maximal frontocentral region.  EEG showed continuous 3 to 5 Hz theta-delta slowing in right hemisphere.  Hyperventilation and photic stimulation were not performed.   ABNORMALITY -Continuous slow, right hemisphere - Background asymmetry, right<left IMPRESSION: This study is suggestive of cortical dysfunction in right hemisphere consistent with underlying stroke. No seizures or epileptiform discharges were seen throughout the recording. Lora Havens   ECHOCARDIOGRAM COMPLETE  Result Date: 06/30/2020    ECHOCARDIOGRAM REPORT   Patient Name:   Tyler Richards Date of Exam: 06/30/2020 Medical Rec #:  [id number]     Height:       74.0 in Accession #:    [id number]    Weight:       187.6 lb Date of Birth:  12-17-1950     BSA:          2.115 m Patient Age:    69 years      BP:           162/65 mmHg Patient Gender: M             HR:           52 bpm. Exam Location:  Inpatient Procedure: 2D Echo, Cardiac Doppler, Color Doppler and Intracardiac            Opacification Agent Indications:    CVA  History:        Patient has no prior history of Echocardiogram examinations.                 Stroke and  COPD, Signs/Symptoms:Resp. failure; Risk                 Factors:Hypertension.  Sonographer:    Dustin Flock Referring Phys: [phone] ERIC LINDZEN  Sonographer Comments: Technically difficult study due to poor echo windows and echo performed with patient supine and on artificial respirator. Image acquisition challenging due to COPD and Image acquisition challenging due to respiratory motion. IMPRESSIONS  1. Left ventricular ejection fraction, by estimation, is 50 to 55%. The left ventricle has low normal function. The left ventricle has no regional wall motion abnormalities. Left ventricular diastolic parameters are consistent with Grade I diastolic dysfunction (impaired relaxation).  2. Right ventricular systolic function is normal. The right ventricular size is normal. There is mildly elevated pulmonary artery systolic pressure.  3. Left atrial size was mildly dilated.  4. The mitral valve is normal in structure. No evidence of mitral valve regurgitation. No evidence of mitral stenosis.  5. The aortic valve is normal in structure. Aortic valve regurgitation is not visualized. No aortic stenosis is present.  6. The inferior vena cava is dilated in size with <50% respiratory variability, suggesting right atrial pressure of 15 mmHg. FINDINGS  Left Ventricle: Left ventricular ejection fraction, by estimation, is 50 to 55%. The left ventricle has low normal function. The left ventricle has no regional wall motion abnormalities. Definity contrast agent was given IV to delineate the left ventricular endocardial borders. The left ventricular internal cavity size was normal in size. There is no left ventricular hypertrophy. Left ventricular diastolic parameters are consistent with Grade I diastolic dysfunction (impaired relaxation). Indeterminate filling pressures. Right Ventricle: The right ventricular size is normal. No increase in right ventricular wall thickness. Right ventricular systolic function is normal. There is  mildly elevated pulmonary artery systolic pressure. The tricuspid regurgitant velocity is 2.43  m/s, and with an assumed right atrial pressure of 15 mmHg, the estimated right ventricular systolic pressure is 41.7 mmHg. Left Atrium: Left atrial size  was mildly dilated. Right Atrium: Right atrial size was normal in size. Pericardium: There is no evidence of pericardial effusion. Mitral Valve: The mitral valve is normal in structure. No evidence of mitral valve regurgitation. No evidence of mitral valve stenosis. Tricuspid Valve: The tricuspid valve is normal in structure. Tricuspid valve regurgitation is trivial. No evidence of tricuspid stenosis. Aortic Valve: The aortic valve is normal in structure. Aortic valve regurgitation is not visualized. No aortic stenosis is present. Pulmonic Valve: The pulmonic valve was normal in structure. Pulmonic valve regurgitation is not visualized. No evidence of pulmonic stenosis. Aorta: The aortic root is normal in size and structure. Venous: The inferior vena cava is dilated in size with less than 50% respiratory variability, suggesting right atrial pressure of 15 mmHg. IAS/Shunts: No atrial level shunt detected by color flow Doppler.  LEFT VENTRICLE PLAX 2D LVIDd:         5.30 cm  Diastology LVIDs:         3.80 cm  LV e' medial:    6.85 cm/s LV PW:         1.30 cm  LV E/e' medial:  10.2 LV IVS:        1.00 cm  LV e' lateral:   12.10 cm/s LVOT diam:     2.10 cm  LV E/e' lateral: 5.8 LV SV:         106 LV SV Index:   50 LVOT Area:     3.46 cm  RIGHT VENTRICLE RV Basal diam:  3.80 cm RV S prime:     7.07 cm/s TAPSE (M-mode): 3.3 cm LEFT ATRIUM           Index       RIGHT ATRIUM           Index LA diam:      4.30 cm 2.03 cm/m  RA Area:     21.00 cm LA Vol (A4C): 45.5 ml 21.51 ml/m RA Volume:   67.30 ml  31.82 ml/m  AORTIC VALVE LVOT Vmax:   150.00 cm/s LVOT Vmean:  90.400 cm/s LVOT VTI:    0.305 m  AORTA Ao Root diam: 3.50 cm MITRAL VALVE               TRICUSPID VALVE MV Area  (PHT): 2.80 cm    TR Peak grad:   23.6 mmHg MV Decel Time: 271 msec    TR Vmax:        243.00 cm/s MV E velocity: 69.80 cm/s MV A velocity: 68.60 cm/s  SHUNTS MV E/A ratio:  1.02        Systemic VTI:  0.30 m                            Systemic Diam: 2.10 cm Skeet Latch MD Electronically signed by Skeet Latch MD Signature Date/Time: 06/30/2020/11:25:18 AM    Final    IR PERCUTANEOUS ART THROMBECTOMY/INFUSION INTRACRANIAL INC DIAG ANGIO  Result Date: 07/02/2020 INDICATION: New onset right gaze deviation, left hemiplegia, and left-sided neglect. EXAM: 1. EMERGENT LARGE VESSEL OCCLUSION THROMBOLYSIS anterior CIRCULATION) COMPARISON:  CT angiogram of the head and neck of June 29, 2020. MEDICATIONS: Ancef 2 g IV was administered within 1 hour of the procedure. ANESTHESIA/SEDATION: General anesthesia CONTRAST:  Isovue 300 approximately 165 mL FLUOROSCOPY TIME:  Fluoroscopy Time: 97 minutes 0 seconds (3622 mGy). COMPLICATIONS: None immediate. TECHNIQUE: Following a full explanation of the procedure along with the potential  associated complications, an informed witnessed consent was obtained from the spouse. The risks of intracranial hemorrhage of 10%, worsening neurological deficit, ventilator dependency, death and inability to revascularize were all reviewed in detail with the patient's spouse. The patient was then put under general anesthesia by the Department of Anesthesiology at [facility]. The right groin was prepped and draped in the usual sterile fashion. Thereafter using modified Seldinger technique, transfemoral access into the right common femoral artery was obtained without difficulty. Over a 0.035 inch guidewire an 8 French 25 Pinnacle sheath was inserted. Through this, and also over a 0.035 inch guidewire a 5 French 125 cm Berenstein select inside of the balloon guide catheter combinationwas advanced to the aortic arch region and selectively positioned in the distal right common carotid  artery. Guidewire, and the select catheter removed. Good aspiration obtained from the hub of the balloon guide catheter in the right common carotid artery. Arteriogram was then performed centered extra cranially and intracranially. FINDINGS: The right common carotid arteriogram demonstrates the right external carotid artery and its major branches to be widely patent. The right internal carotid artery at the bulb demonstrates a moderate sized plaque associated with complete occlusion just distal to the bulb. No evidence of distal angiographic string sign, or reconstitution of right internal carotid artery in the cavernous segment was noted. PROCEDURE: Through the balloon guide catheter, a combination of an 021 Trevo ProVue microcatheter over a 0.014 inch standard Synchro micro guidewire with a J configuration, access through the occluded right internal carotid artery was obtained with the micro guidewire leading followed by the microcatheter. The combination was advanced to the horizontal petrous segment where the micro guidewire was removed. Poor aspiration was obtained from the microcatheter hub on account of the extensive clot in the entire right internal carotid artery intracranially and extra cranially. The microcatheter was then exchanged for a 014 inch Synchro standard 300 cm exchange micro guidewire with a J-tip configuration. A control arteriogram performed through the balloon guide demonstrated minimally improved caliber through the proximal right internal carotid artery. A 4 mm x 30 mm Viatrac 14 angioplasty balloon catheter which had been prepped with 50% contrast and 50% heparinized saline infusion and integrated with 75% contrast and 25% heparinized saline infusion was advanced over the exchange micro guidewire using the rapid exchange technique. The proximal and distal markers were then positioned in the described landing zones. A control inflation was then performed using micro inflation syringe  device via micro tubing to 12 atmospheres. This was maintained for about 20 seconds and retrieved proximally. A control arteriogram performed through the balloon guide demonstrated improved caliber in the bulb but there continued be a high-grade stenosis in the proximal right internal carotid artery. This prompted a second angioplasty in the manner as described above again to 12 atmospheres for approximately 20 seconds. The balloon was then deflated and retrieved and removed. A control arteriogram performed through the balloon guide demonstrated significantly improved caliber and flow through the angioplastied segment with improved flow to the more distal right internal carotid artery. However, there continued to be extensive clot noted within the internal carotid artery at the cranial skull base and the supraclinoid right ICA. An 021 Trevo ProVue catheter inside of a 071 134 cm aspiration catheter, combination was advanced over the exchange micro guidewire to the cavernous segment of the right internal carotid artery. Exchange micro guidewire was replaced with a regular 014 inch standard Synchro micro guidewire with a J configuration. This was then  advanced using a torque device through the occluded right middle cerebral artery into one of the inferior division branches in the M2 M3 region followed by the microcatheter. The guidewire was removed. Good aspiration obtained from the hub of the microcatheter. A gentle control arteriogram performed through this demonstrated slow flow through the vessel itself. A 4 mm x 40 mm Solitaire X retrieval device was then advanced to the distal end of the microcatheter. The retriever was deployed in the usual manner. The Zoom aspiration catheter was advanced to the mid right M1 segment. Continuous aspiration was then performed at the hub of the aspiration catheter for 2-1/2 minutes, with proximal flow arrest in the right internal carotid artery. The retrieval device, the  microcatheter and the aspiration catheter were retrieved and removed. Copious amounts of clot were captured into the [place], and also in the retrieval device. Following reversal of flow arrest, a control arteriogram performed through the balloon guide catheter demonstrated now improved flow through the proximal right internal carotid artery extra cranially and intracranially. There is now patency of the right anterior cerebral artery with a stump of the occluded right middle cerebral artery. A second pass was then made again using the above combination. The microcatheter was now accessed into the superior division of the right middle cerebral artery M2 M3 region over a 0.014 inch standard Synchro micro guidewire. Free aspiration of a blood was obtained at the hub of the microcatheter. A Tiger 21 retrieval device was then advanced to the distal end of the retrieval device. This was then deployed by retrieving the microcatheter. The proximal portion of the retrieval device was within the proximal aspect of the occluded right middle cerebral artery. Free aspiration was then started through the aspiration catheter with the Penumbra aspiration device which was positioned in the distal portion of the occluded clot. The retrieval device was then opened and closed until there was kinking at the proximal portion. The microcatheter was then advanced to the proximal marker on the retrieval device. With constant aspiration continued, the Tiger 21 retrieval device was then removed as constant aspiration was applied at the hub of the aspiration catheter, and also with proximal flow arrest and aspiration at the hub of the balloon guide with a 60 mL syringe. Multiple fragments of clot were obtained within the [place], and also entangled in the retrieval device. With reversal of the proximal flow arrest, a control arteriogram performed through the balloon guide in the right internal carotid artery now demonstrated improved  opacification of the right middle cerebral artery to where there was now opacification of the anterior temporal branch. Distal branch remains occluded with no change in anterior cerebral artery. A third pass was now made using an 027 150 cm Marksman microcatheter which was advanced again using the combination of the 071 Zoom aspiration catheter over a 0.014 inch standard Synchro micro guidewire. This combination was advanced into the now the inferior division with the microcatheter advanced into the M2 region of the inferior division. The micro guidewire was removed. Good aspiration obtained from the hub of the 027 microcatheter. A gentle control arteriogram performed through this now demonstrated free flow into the distal vasculature. The aspiration catheter was engaged into the occluded distal right middle cerebral artery at the origin of the inferior division. Aspiration was now at the hub of the aspiration catheter embedded in the right middle cerebral artery clot and also at the hub of the Marksman catheter for approximately 2 minutes, with proximal  flow arrest in the right internal carotid artery and constant aspiration at its hub. The Marksman catheter was then gently retrieved while maintaining aspiration through the Zoom aspiration catheter. The Zoom aspiration catheter remained loft to aspiration. This was then retrieved and removed. Again clots were seen in the aspirate. Control arteriogram performed following reversal of flow arrest in the right internal carotid artery now demonstrated complete revascularization of the right MCA distribution, into the distal distribution achieving a TICI 2c revascularization. The right anterior cerebral artery remained widely patent. Measurements were now performed of the right internal carotid artery proximally, and the right common carotid artery distally at the proposed landing zones for the revascularization stent. An 021 Trevo ProVue microcatheter was advanced to  the petrous segment of the right internal carotid artery over a 0.014 inch standard Synchro micro guidewire, and replaced with an 014 inch 300 cm Synchro exchange micro guidewire. Safe position of the tip of the exchange micro guidewire was verified. It was elected to proceed with placement of a 6/8 mm x 40 mm Xact stent across the diseased previously occluded right internal carotid artery proximally. This was retrogradely purged with heparinized saline infusion. Using the rapid exchange technique, the delivery system of the stent was then advanced and positioned covering distally, and also the proximal portion of the landing zones. Stent was deployed in the usual manner without any difficulty. The delivery system was removed. Good aspiration obtained from the hub of the balloon guide in the right common carotid artery. A control arteriogram performed through this demonstrated excellent positioning and apposition of the stent with now free flow noted into the intracranial right ICA and the right middle and right anterior cerebral arteries being widely patent. Prior to this patient was loaded with 81 mg aspirin, and 180 mg of Brilinta via an orogastric tube. CT scan of the brain was then performed which demonstrated contrast blush of the right basal ganglia, and also to the 3 focal areas of hyperdensity suspicious for micro hemorrhages. 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 Three quarter bolus dose of Cangrelor was then given intravenously in order to salvage the occluding stent in the right internal carotid artery. The balloon guide was then removed following removal of the exchange micro guidewire. The diagnostic catheter was then positioned in the left common carotid artery. This demonstrated the left external carotid  artery and the left internal carotid arteries to be widely patent. Patency of the left internal carotid to the cranial skull base was verified. Also the left middle and the left anterior cerebral arteries were seen to opacify into the capillary and venous phases. Also almost simultaneously cross-filling via the anterior communicating artery of the right anterior and the right middle cerebral arteries is noted via the anterior communicating artery. No gross filling defects were seen in the middle cerebral artery distribution. 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 The diagnostic catheter was removed. The 8 French Pinnacle sheath was then exchanged for a short 8 [place] sheath which in turn was then removed with the successful application of the 7 [place] ExoSeal closure device with hemostasis in the right groin. Distal pulses remained palpable in the feet bilaterally unchanged. A second CT scan of the brain demonstrated no significant change in  the right basal ganglia with continued presence of at least 3 foci of hemorrhage. No mass effect or midline shift was seen. Patient was left intubated on account of patient's medical condition and inability to respond appropriately to protect the airway. Patient was then transferred to the neuro ICU for post recanalization management. IMPRESSION: Status post endovascular complete revascularization of the right middle cerebral artery and the right anterior cerebral artery with 1 pass with a Solitaire 4 mm x 40 mm X retrieval device and aspiration, and 1 pass with the Tiger 21 retrieval device with proximal aspiration, and with 1 pass with contact aspiration achieving a TICI 2c revascularization of the right middle cerebral artery distribution. 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 Attempt to rescue with 3/4 bolus dose of Cangrelor IV. PLAN: Follow-up in the clinic 4 to 6 weeks post discharge. Electronically Signed   By: Luanne Bras M.D.   On: 06/30/2020 13:20   CT HEAD CODE STROKE WO CONTRAST  Result Date: 06/29/2020 CLINICAL DATA:  Code stroke.  Slurred speech and left facial droop EXAM: CT HEAD WITHOUT CONTRAST TECHNIQUE: Contiguous axial images were obtained from the base of the skull through the vertex without intravenous contrast. COMPARISON:  None. FINDINGS: Brain: Small remote appearing cortically based infarcts in the superior right frontal lobe. No hemorrhage, hydrocephalus, or masslike finding. Vascular: Hyperdense distal right ICA to MCA branches. Atherosclerotic calcification. Skull: Negative Sinuses/Orbits: Right maxillary, ethmoid, and frontal sinus opacification with sclerotic wall thickening at the maxillary sinus. Other: Critical Value/emergent results were called by telephone at the time of interpretation on 06/29/2020 at 9:23 am to provider Lindzen , who verbally acknowledged these results. ASPECTS [facility] Stroke Program Early CT Score) - Ganglionic level infarction (caudate, lentiform nuclei, internal capsule, insula, M1-M3 cortex): Posterior putamen appears blunted compared to the left. Equivocal for small insular cortex infarct. - Supraganglionic infarction (M4-M6 cortex): 3, when accounting for chronic infarct Total score (0-10 with 10 being normal): 9, when accounting for chronic infarct IMPRESSION: 1. Hyperdense distal right ICA and proximal MCA. 2. Blunted appearance of the posterior right putamen. Chronic right high frontal cortex infarcts. ASPECTS is 9 when excluding the chronic changes. Electronically Signed   By: Monte Fantasia M.D.   On: 06/29/2020 09:25   VAS US CAROTID  Result Date: 07/02/2020 Carotid Arterial Duplex Study Indications:   CVA  and Right stent. Other Factors: Rt ica stent- 06/29/20. Performing Technologist: [name] RVS  Examination Guidelines: A complete evaluation includes B-mode imaging, spectral Doppler, color Doppler, and power Doppler as needed of all accessible portions of each vessel. Bilateral testing is considered an integral part of a complete examination. Limited examinations for reoccurring indications may be performed as noted.  Right Carotid Findings: +----------+--------+--------+--------+------------------+--------+           PSV cm/sEDV cm/sStenosisPlaque DescriptionComments +----------+--------+--------+--------+------------------+--------+ CCA Prox  101                     heterogenous               +----------+--------+--------+--------+------------------+--------+ CCA Distal57                      heterogenous               +----------+--------+--------+--------+------------------+--------+ ECA       103     6                                          +----------+--------+--------+--------+------------------+--------+ +----------+--------+-------+--------+-------------------+  PSV cm/sEDV cmsDescribeArm Pressure (mmHG) +----------+--------+-------+--------+-------------------+ Subclavian160                                        +----------+--------+-------+--------+-------------------+ +---------+--------+--+--------+--+---------+ VertebralPSV cm/s63EDV cm/s12Antegrade +---------+--------+--+--------+--+---------+  Right Stent(s): +---------------+--+-+--------+++ Prox to Stent  274         +---------------+--+-+--------+++ Proximal Stent 49          +---------------+--+-+--------+++ Mid Stent         occluded +---------------+--+-+--------+++ Distal Stent      occluded +---------------+--+-+--------+++ Distal to Stent   occluded +---------------+--+-+--------+++   Left Carotid Findings:  +----------+--------+--------+--------+------------------+--------+           PSV cm/sEDV cm/sStenosisPlaque DescriptionComments +----------+--------+--------+--------+------------------+--------+ CCA Prox  133     18              heterogenous               +----------+--------+--------+--------+------------------+--------+ CCA Distal95      17              heterogenous               +----------+--------+--------+--------+------------------+--------+ ICA Prox  97      22      1-39%   heterogenous               +----------+--------+--------+--------+------------------+--------+ ICA Distal108     36                                         +----------+--------+--------+--------+------------------+--------+ ECA       120     10                                         +----------+--------+--------+--------+------------------+--------+ +----------+--------+--------+--------+-------------------+           PSV cm/sEDV cm/sDescribeArm Pressure (mmHG) +----------+--------+--------+--------+-------------------+ [id number]                                         +----------+--------+--------+--------+-------------------+ +---------+--------+--+--------+--+---------+ VertebralPSV cm/s48EDV cm/s13Antegrade +---------+--------+--+--------+--+---------+   Summary: Right Carotid: ICA stent appears occluded. Left Carotid: Velocities in the left ICA are consistent with a 1-39% stenosis. Vertebrals: Bilateral vertebral arteries demonstrate antegrade flow. *See table(s) above for measurements and observations.  Electronically signed by Antony Contras MD on 07/02/2020 at 8:34:52 AM.    Final    VAS [place] LOWER EXTREMITY VENOUS (DVT)  Result Date: 07/01/2020  Lower Venous DVTStudy Indications: Stroke.  Comparison Study: no prior Performing Technologist: [name] RVS  Examination Guidelines: A complete evaluation includes B-mode imaging, spectral Doppler, color Doppler, and  power Doppler as needed of all accessible portions of each vessel. Bilateral testing is considered an integral part of a complete examination. Limited examinations for reoccurring indications may be performed as noted. The reflux portion of the exam is performed with the patient in reverse Trendelenburg.  +---------+---------------+---------+-----------+----------+--------------+ RIGHT    CompressibilityPhasicitySpontaneityPropertiesThrombus Aging +---------+---------------+---------+-----------+----------+--------------+ CFV      Full           Yes      Yes                                 +---------+---------------+---------+-----------+----------+--------------+  SFJ      Full                                                        +---------+---------------+---------+-----------+----------+--------------+ FV Prox  Full                                                        +---------+---------------+---------+-----------+----------+--------------+ FV Mid   Full                                                        +---------+---------------+---------+-----------+----------+--------------+ FV DistalFull                                                        +---------+---------------+---------+-----------+----------+--------------+ PFV      Full                                                        +---------+---------------+---------+-----------+----------+--------------+ POP      Full           Yes      Yes                                 +---------+---------------+---------+-----------+----------+--------------+ PTV      Full                                                        +---------+---------------+---------+-----------+----------+--------------+ PERO     Full                                                        +---------+---------------+---------+-----------+----------+--------------+    +---------+---------------+---------+-----------+----------+--------------+ LEFT     CompressibilityPhasicitySpontaneityPropertiesThrombus Aging +---------+---------------+---------+-----------+----------+--------------+ CFV      Full           Yes      Yes                                 +---------+---------------+---------+-----------+----------+--------------+ SFJ      Full                                                        +---------+---------------+---------+-----------+----------+--------------+  FV Prox  Full                                                        +---------+---------------+---------+-----------+----------+--------------+ FV Mid   Full                                                        +---------+---------------+---------+-----------+----------+--------------+ FV DistalFull                                                        +---------+---------------+---------+-----------+----------+--------------+ PFV      Full                                                        +---------+---------------+---------+-----------+----------+--------------+ POP      Full           Yes      Yes                                 +---------+---------------+---------+-----------+----------+--------------+ PTV      Full                                                        +---------+---------------+---------+-----------+----------+--------------+     Summary: BILATERAL: - No evidence of deep vein thrombosis seen in the lower extremities, bilaterally. - No evidence of superficial venous thrombosis in the lower extremities, bilaterally. -   *See table(s) above for measurements and observations. Electronically signed by Harold Barban MD on 07/01/2020 at 9:06:00 PM.    Final    US Abdomen Limited RUQ  Result Date: 07/08/2020 CLINICAL DATA:  Abnormal liver function tests EXAM: ULTRASOUND ABDOMEN LIMITED RIGHT UPPER QUADRANT COMPARISON:  None.  FINDINGS: Gallbladder: No gallstones or wall thickening visualized. No sonographic Murphy sign noted by sonographer. Common bile duct: Diameter: 4 mm in proximal diameter Liver: No focal lesion identified. Within normal limits in parenchymal echogenicity. Portal vein is patent on color Doppler imaging with normal direction of blood flow towards the liver. Other: None. IMPRESSION: Normal examination Electronically Signed   By: Fidela Salisbury MD   On: 07/08/2020 02:48   IR ANGIO INTRA EXTRACRAN SEL COM CAROTID INNOMINATE UNI L MOD SED  Result Date: 07/02/2020 INDICATION: New onset right gaze deviation, left hemiplegia, and left-sided neglect. EXAM: 1. EMERGENT LARGE VESSEL OCCLUSION THROMBOLYSIS anterior CIRCULATION) COMPARISON:  CT angiogram of the head and neck of June 29, 2020. MEDICATIONS: Ancef 2 g IV was administered within 1 hour of the procedure. ANESTHESIA/SEDATION: General anesthesia CONTRAST:  Isovue 300 approximately 165 mL FLUOROSCOPY TIME:  Fluoroscopy Time: 97 minutes 0  seconds (3622 mGy). COMPLICATIONS: None immediate. TECHNIQUE: Following a full explanation of the procedure along with the potential associated complications, an informed witnessed consent was obtained from the spouse. The risks of intracranial hemorrhage of 10%, worsening neurological deficit, ventilator dependency, death and inability to revascularize were all reviewed in detail with the patient's spouse. The patient was then put under general anesthesia by the Department of Anesthesiology at [facility]. The right groin was prepped and draped in the usual sterile fashion. Thereafter using modified Seldinger technique, transfemoral access into the right common femoral artery was obtained without difficulty. Over a 0.035 inch guidewire an 8 French 25 Pinnacle sheath was inserted. Through this, and also over a 0.035 inch guidewire a 5 French 125 cm Berenstein select inside of the balloon guide catheter combinationwas  advanced to the aortic arch region and selectively positioned in the distal right common carotid artery. Guidewire, and the select catheter removed. Good aspiration obtained from the hub of the balloon guide catheter in the right common carotid artery. Arteriogram was then performed centered extra cranially and intracranially. FINDINGS: The right common carotid arteriogram demonstrates the right external carotid artery and its major branches to be widely patent. The right internal carotid artery at the bulb demonstrates a moderate sized plaque associated with complete occlusion just distal to the bulb. No evidence of distal angiographic string sign, or reconstitution of right internal carotid artery in the cavernous segment was noted. PROCEDURE: Through the balloon guide catheter, a combination of an 021 Trevo ProVue microcatheter over a 0.014 inch standard Synchro micro guidewire with a J configuration, access through the occluded right internal carotid artery was obtained with the micro guidewire leading followed by the microcatheter. The combination was advanced to the horizontal petrous segment where the micro guidewire was removed. Poor aspiration was obtained from the microcatheter hub on account of the extensive clot in the entire right internal carotid artery intracranially and extra cranially. The microcatheter was then exchanged for a 014 inch Synchro standard 300 cm exchange micro guidewire with a J-tip configuration. A control arteriogram performed through the balloon guide demonstrated minimally improved caliber through the proximal right internal carotid artery. A 4 mm x 30 mm Viatrac 14 angioplasty balloon catheter which had been prepped with 50% contrast and 50% heparinized saline infusion and integrated with 75% contrast and 25% heparinized saline infusion was advanced over the exchange micro guidewire using the rapid exchange technique. The proximal and distal markers were then positioned in the  described landing zones. A control inflation was then performed using micro inflation syringe device via micro tubing to 12 atmospheres. This was maintained for about 20 seconds and retrieved proximally. A control arteriogram performed through the balloon guide demonstrated improved caliber in the bulb but there continued be a high-grade stenosis in the proximal right internal carotid artery. This prompted a second angioplasty in the manner as described above again to 12 atmospheres for approximately 20 seconds. The balloon was then deflated and retrieved and removed. A control arteriogram performed through the balloon guide demonstrated significantly improved caliber and flow through the angioplastied segment with improved flow to the more distal right internal carotid artery. However, there continued to be extensive clot noted within the internal carotid artery at the cranial skull base and the supraclinoid right ICA. An 021 Trevo ProVue catheter inside of a 071 134 cm aspiration catheter, combination was advanced over the exchange micro guidewire to the cavernous segment of the right internal carotid artery. Exchange micro guidewire  was replaced with a regular 014 inch standard Synchro micro guidewire with a J configuration. This was then advanced using a torque device through the occluded right middle cerebral artery into one of the inferior division branches in the M2 M3 region followed by the microcatheter. The guidewire was removed. Good aspiration obtained from the hub of the microcatheter. A gentle control arteriogram performed through this demonstrated slow flow through the vessel itself. A 4 mm x 40 mm Solitaire X retrieval device was then advanced to the distal end of the microcatheter. The retriever was deployed in the usual manner. The Zoom aspiration catheter was advanced to the mid right M1 segment. Continuous aspiration was then performed at the hub of the aspiration catheter for 2-1/2 minutes, with  proximal flow arrest in the right internal carotid artery. The retrieval device, the microcatheter and the aspiration catheter were retrieved and removed. Copious amounts of clot were captured into the [place], and also in the retrieval device. Following reversal of flow arrest, a control arteriogram performed through the balloon guide catheter demonstrated now improved flow through the proximal right internal carotid artery extra cranially and intracranially. There is now patency of the right anterior cerebral artery with a stump of the occluded right middle cerebral artery. A second pass was then made again using the above combination. The microcatheter was now accessed into the superior division of the right middle cerebral artery M2 M3 region over a 0.014 inch standard Synchro micro guidewire. Free aspiration of a blood was obtained at the hub of the microcatheter. A Tiger 21 retrieval device was then advanced to the distal end of the retrieval device. This was then deployed by retrieving the microcatheter. The proximal portion of the retrieval device was within the proximal aspect of the occluded right middle cerebral artery. Free aspiration was then started through the aspiration catheter with the Penumbra aspiration device which was positioned in the distal portion of the occluded clot. The retrieval device was then opened and closed until there was kinking at the proximal portion. The microcatheter was then advanced to the proximal marker on the retrieval device. With constant aspiration continued, the Tiger 21 retrieval device was then removed as constant aspiration was applied at the hub of the aspiration catheter, and also with proximal flow arrest and aspiration at the hub of the balloon guide with a 60 mL syringe. Multiple fragments of clot were obtained within the [place], and also entangled in the retrieval device. With reversal of the proximal flow arrest, a control arteriogram performed  through the balloon guide in the right internal carotid artery now demonstrated improved opacification of the right middle cerebral artery to where there was now opacification of the anterior temporal branch. Distal branch remains occluded with no change in anterior cerebral artery. A third pass was now made using an 027 150 cm Marksman microcatheter which was advanced again using the combination of the 071 Zoom aspiration catheter over a 0.014 inch standard Synchro micro guidewire. This combination was advanced into the now the inferior division with the microcatheter advanced into the M2 region of the inferior division. The micro guidewire was removed. Good aspiration obtained from the hub of the 027 microcatheter. A gentle control arteriogram performed through this now demonstrated free flow into the distal vasculature. The aspiration catheter was engaged into the occluded distal right middle cerebral artery at the origin of the inferior division. Aspiration was now at the hub of the aspiration catheter embedded in the right middle  cerebral artery clot and also at the hub of the Marksman catheter for approximately 2 minutes, with proximal flow arrest in the right internal carotid artery and constant aspiration at its hub. The Marksman catheter was then gently retrieved while maintaining aspiration through the Zoom aspiration catheter. The Zoom aspiration catheter remained loft to aspiration. This was then retrieved and removed. Again clots were seen in the aspirate. Control arteriogram performed following reversal of flow arrest in the right internal carotid artery now demonstrated complete revascularization of the right MCA distribution, into the distal distribution achieving a TICI 2c revascularization. The right anterior cerebral artery remained widely patent. Measurements were now performed of the right internal carotid artery proximally, and the right common carotid artery distally at the proposed landing  zones for the revascularization stent. An 021 Trevo ProVue microcatheter was advanced to the petrous segment of the right internal carotid artery over a 0.014 inch standard Synchro micro guidewire, and replaced with an 014 inch 300 cm Synchro exchange micro guidewire. Safe position of the tip of the exchange micro guidewire was verified. It was elected to proceed with placement of a 6/8 mm x 40 mm Xact stent across the diseased previously occluded right internal carotid artery proximally. This was retrogradely purged with heparinized saline infusion. Using the rapid exchange technique, the delivery system of the stent was then advanced and positioned covering distally, and also the proximal portion of the landing zones. Stent was deployed in the usual manner without any difficulty. The delivery system was removed. Good aspiration obtained from the hub of the balloon guide in the right common carotid artery. A control arteriogram performed through this demonstrated excellent positioning and apposition of the stent with now free flow noted into the intracranial right ICA and the right middle and right anterior cerebral arteries being widely patent. Prior to this patient was loaded with 81 mg aspirin, and 180 mg of Brilinta via an orogastric tube. CT scan of the brain was then performed which demonstrated contrast blush of the right basal ganglia, and also to the 3 focal areas of hyperdensity suspicious for micro hemorrhages. 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 Three quarter bolus dose of Cangrelor was then given intravenously in order to salvage the occluding stent in the right internal carotid artery. The balloon guide was then removed following removal of the exchange micro guidewire. The diagnostic catheter was then  positioned in the left common carotid artery. This demonstrated the left external carotid artery and the left internal carotid arteries to be widely patent. Patency of the left internal carotid to the cranial skull base was verified. Also the left middle and the left anterior cerebral arteries were seen to opacify into the capillary and venous phases. Also almost simultaneously cross-filling via the anterior communicating artery of the right anterior and the right middle cerebral arteries is noted via the anterior communicating artery. No gross filling defects were seen in the middle cerebral artery distribution. 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 The diagnostic catheter was removed. The 8 French Pinnacle sheath was then exchanged for a short 8 [place] sheath which in turn was then removed with the successful application of the 7 [place] ExoSeal closure device with hemostasis in the right groin. Distal pulses remained  palpable in the feet bilaterally unchanged. A second CT scan of the brain demonstrated no significant change in the right basal ganglia with continued presence of at least 3 foci of hemorrhage. No mass effect or midline shift was seen. Patient was left intubated on account of patient's medical condition and inability to respond appropriately to protect the airway. Patient was then transferred to the neuro ICU for post recanalization management. IMPRESSION: Status post endovascular complete revascularization of the right middle cerebral artery and the right anterior cerebral artery with 1 pass with a Solitaire 4 mm x 40 mm X retrieval device and aspiration, and 1 pass with the Tiger 21 retrieval device with proximal aspiration, and with 1 pass with contact aspiration achieving a TICI 2c revascularization of the right middle cerebral artery distribution. 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 Attempt to rescue with 3/4 bolus dose of Cangrelor IV. PLAN: Follow-up in the clinic 4 to 6 weeks post discharge. Electronically Signed   By: Luanne Bras M.D.   On: 06/30/2020 13:20    EKG: this am showed sinus bradycardia at 44 bpm (personally reviewed)  TELEMETRY: N/A (personally reviewed)  Assessment/Plan: 1.  Orthostasis Likely 2/2 to fluid loss in setting of loose stools throughout weekend.  Continue midodrine 5 mg TID Increase florinef 0.1 mg to BID  2. Bradycardia HRs have been 40-50s since admission to rehab He has been normotensive with same HRs. Not clearly correlated with symptoms this am, but clearly a component of SND at this juncture.   3. BRBPR No further over weekend, but was having loose stools post enema. Hgb stable at 9.7 this am.   4. Post stroke/right ICA stenting With sinus bradycardia and pauses 2/2 hypervagatonia while in ICU.  HRs stable 40-60s but continues to have orthostasis.   Dr. Lovena Le has seen the patient. Difficult scenario with orthostasis affecting therapy. Not clearly related to his bradycardia, but sinus node dysfunction present off of AV nodal blockades. Pacing would interfere with rehab of his left arm, and if paced on right side, he would be limited bilaterally. Further disposition per Dr. Lovena Le.   For questions or updates, please contact [place] Please consult [email or web address] for contact info under Cardiology/STEMI.  Jacalyn Lefevre, PA-C  07/13/2020 12:29 PM  EP Attending  Patient seen and examined. Agree with the findings as noted above. He has recurrent syncope and known orthostasis. He was noted to have pauses with carotid sinus manipulation. He is not on any AV or sinus nodal blocking drugs and also has baseline  sinus node dysfunction. His carotid/cerebral vascular issues have been documented above. He has a dense left hemiparesis. His ecg shows sinus bradycardia.  A/P Sinus node dysfunction - this is complicating things. I agree with prior notes from Dr. Quentin Ore. We will try and hold off on pacing, at least as long as we can. We normally place PPM's on the left which would impede his rehab and if placed on the right side, a PPM would make it hard for him to do anything for many weeks due to the restriction on the side of his new implant.  Orthostasis - we have ordered to increase his dose of florinef. I would like his baseline bp to be on the high side. Anemia - keep H/H replete.   Carleene Overlie Delton Stelle,MD

## 2020-07-13 NOTE — Progress Notes (Addendum)
Patient reports chest pain and SOB.  Vitals taken, see flowsheet.  Marlowe Shores PA notified, see new orders.  IS given to patient and patient instructed on how to use.

## 2020-07-13 NOTE — Progress Notes (Signed)
Speech Language Pathology Daily Session Note  Patient Details  Name: Tyler Richards MRN: [id number] Date of Birth: 1951-07-14  Today's Date: 07/13/2020 SLP Individual Time: 0930-1011 SLP Individual Time Calculation (min): 41 min  Short Term Goals: Week 1: SLP Short Term Goal 1 (Week 1): Pt will demonstrate efficient mastication and oral clearance and minimal overt s/sx aspiration on current (recently upgraded) Dys 2 (minced/ground) texture diet with thin liquids and no more than Supervision A cues for use of compensatory swallow strategies. SLP Short Term Goal 2 (Week 1): Pt will consume trials of Dys 3 (mech soft) solids X3 with efficient mastication and oral clearance and no more than Supervision A cues required for use of swallow straetgies prior to advancement. SLP Short Term Goal 3 (Week 1): Pt will demonstrate ability to problem solve mildly complex to complex tasks with Min A verbal/visual cues. SLP Short Term Goal 4 (Week 1): Pt will selectively attend to tasks with Min A verbal cues for redirection. SLP Short Term Goal 5 (Week 1): Pt will detect functional errors wtih Min A verbal/visual cues. SLP Short Term Goal 6 (Week 1): Pt will use speech intelligibility strategies to achieve 95% intelligibility in conversation with Min A verbal cues.  Skilled Therapeutic Interventions: Pt was seen for skilled ST targeting cognitive goals. [name] wife was at bedside throughout session. Pt required Min A verbal cues for visual scanning and error awareness during a basic medication management tasking using cash and coins. Only Supervision A verbal cues provided for problem solving. Pt reported he was unable to read medication labels, however her interpreted info from the labels when read by therapist to organize a basic QID pill chair with Min A verbal cues for problem solving and error awareness. Pt continues to require increased cueing for working and short term memory within tasks, and to recall daily  information (~Mod A). Pt left sitting in bed with alarm set and needs within reach. Continue per current plan of care.          Pain Pain Assessment Pain Scale: 0-10 Pain Score: 0-No pain  Therapy/Group: Individual Therapy  Arbutus Leas 07/13/2020, 12:10 PM

## 2020-07-13 NOTE — Progress Notes (Signed)
Patient with persistent bradycardia bouts of orthostasis remains on Florinef as well as ProAmatine.  Consulted with cardiology services await plan of care

## 2020-07-14 ENCOUNTER — Inpatient Hospital Stay (HOSPITAL_COMMUNITY): Payer: Medicare Other | Admitting: Occupational Therapy

## 2020-07-14 ENCOUNTER — Inpatient Hospital Stay (HOSPITAL_COMMUNITY): Payer: Medicare Other

## 2020-07-14 ENCOUNTER — Inpatient Hospital Stay (HOSPITAL_COMMUNITY): Payer: Medicare Other | Admitting: Physical Therapy

## 2020-07-14 ENCOUNTER — Inpatient Hospital Stay (HOSPITAL_COMMUNITY): Payer: Medicare Other | Admitting: Speech Pathology

## 2020-07-14 LAB — OCCULT BLOOD X 1 CARD TO LAB, STOOL: Fecal Occult Bld: POSITIVE — AB

## 2020-07-14 MED ORDER — ONDANSETRON HCL 4 MG PO TABS
4.0000 mg | ORAL_TABLET | Freq: Three times a day (TID) | ORAL | Status: DC | PRN
  Administered 2020-07-14 – 2020-07-18 (×4): 4 mg via ORAL
  Filled 2020-07-14 (×5): qty 1

## 2020-07-14 MED ORDER — DICLOFENAC SODIUM 1 % EX GEL
2.0000 g | Freq: Three times a day (TID) | CUTANEOUS | Status: DC | PRN
  Administered 2020-07-14 – 2020-07-26 (×9): 2 g via TOPICAL
  Filled 2020-07-14 (×2): qty 100

## 2020-07-14 NOTE — Progress Notes (Signed)
Speech Language Pathology Daily Session Note  Patient Details  Name: Tyler Richards MRN: [id number] Date of Birth: 02-06-1951  Today's Date: 07/14/2020 SLP Individual Time: 1443-1540 SLP Individual Time Calculation (min): 41 min  Short Term Goals: Week 1: SLP Short Term Goal 1 (Week 1): Pt will demonstrate efficient mastication and oral clearance and minimal overt s/sx aspiration on current (recently upgraded) Dys 2 (minced/ground) texture diet with thin liquids and no more than Supervision A cues for use of compensatory swallow strategies. SLP Short Term Goal 2 (Week 1): Pt will consume trials of Dys 3 (mech soft) solids X3 with efficient mastication and oral clearance and no more than Supervision A cues required for use of swallow straetgies prior to advancement. SLP Short Term Goal 3 (Week 1): Pt will demonstrate ability to problem solve mildly complex to complex tasks with Min A verbal/visual cues. SLP Short Term Goal 4 (Week 1): Pt will selectively attend to tasks with Min A verbal cues for redirection. SLP Short Term Goal 5 (Week 1): Pt will detect functional errors wtih Min A verbal/visual cues. SLP Short Term Goal 6 (Week 1): Pt will use speech intelligibility strategies to achieve 95% intelligibility in conversation with Min A verbal cues.  Skilled Therapeutic Interventions: Pt was seen for skilled ST targeting dysphagia and cognitive goals. SLP facilitated session with Supervision A level verbal cues for clearance of very trace amount of left lingual residue of Dys 2 (minced/ground) solid snack. He is using liquid washes to do this most effectively. Min anterior loss of puree portion of his snack also noted. No overt s/sx aspiration noted across solid of liquid intake. Recommend continue current diet and pt may be reduced to intermittent supervision. Cognitive interventions focused on use of memory strategies. Pt used verbal repetition and visualization strategies to recall details from  picture scenes and verbal appointment reminders with fluctuating Min A for use of the strategies but increased Mod A for accuracy in recall of details. Pt left sitting in chair with alarm set and needs within reach. Continue per current plan of care.          Pain Pain Assessment Pain Score: 4   Therapy/Group: Individual Therapy  Arbutus Leas 07/14/2020, 12:16 PM

## 2020-07-14 NOTE — Progress Notes (Addendum)
Occupational Therapy Session Note  Patient Details  Name: Tyler Richards MRN: [id number] Date of Birth: 04-11-1951  Today's Date: 07/14/2020 OT Individual Time: 1941-7408 OT Individual Time Calculation (min): 53 min    Short Term Goals: Week 1:  OT Short Term Goal 1 (Week 1): Pt will maintain static sitting balance EOB in perparation for selfcare tasks with supervision for at least three mins. OT Short Term Goal 2 (Week 1): Pt will complete LB bathing sit to stand with max assist for two consecutive sessions. OT Short Term Goal 3 (Week 1): Pt will complete toilet transfer squat pivot with max assist . OT Short Term Goal 4 (Week 1): Pt will donn a pullover shirt with no more than mod assist for two consecutive sessions.  Skilled Therapeutic Interventions/Progress Updates:    Pt in bed to start session reporting increased nausea and back pain.  When asked if he had told the nurse about the nausea he stated "no".  Nursing called and made aware.  Pt reported needing to be changed as he had urinated in his brief.  Therapist re-emphasized the need to have him call nursing immediately to when he feels the need to urinate and not to rely on the brief as a substitute.  He voiced understanding.  BP checked in supine at 123/65 with HR at 44 BPM.  He worked on rolling side to side in the bed with min assist and mod instructional cueing for rolling to the right as well as supervision to the left.  Noted bowel incontinence  during session with significant dark colored stool in brief,  He needed total assist for peri cleaning as well as for donning new brief and pulling pants over hips.  He transitioned to sitting with min assist on the right side of the bed and maintained static sitting balance with supervision during completion of oral hygiene.  BP taken in sitting at 111/59 with HR at 50 BPM.  He utilized the LUE with mod assist for stabilizing the toothpaste while he opened it.  Mod instructional cueing needed  for scanning the left side of the table to locate his toothpaste and cup for rinsing.  He returned to supine with pt reporting that he had another abrupt BM while sitting EOB that came on without warning.  Total assist for cleaning up and donning a new brief, with pt using the urinal as well.  He was left with the LUE supporting on pillows secondary to increased tone in the pectoral and with call button and phone in reach.  Bed alarm in place as well.   Therapy Documentation Precautions:  Precautions Precautions: Fall, Other (comment) Precaution Comments: left hemiparesis with left hemi inattention; monitor HR and BP Restrictions Weight Bearing Restrictions: No  Pain: Pain Assessment Pain Scale: Faces Pain Score: 0-No pain ADL: See Care Tool Section for some details of mobility and selfcare  Therapy/Group: Individual Therapy  Bernon Arviso OTR/L 07/14/2020, 4:17 PM

## 2020-07-14 NOTE — Progress Notes (Signed)
Physical Therapy Session Note  Patient Details  Name: Tyler Richards MRN: [id number] Date of Birth: 1950/12/22  Today's Date: 07/14/2020 PT Individual Time: 1011-1050 PT Individual Time Calculation (min): 39 min   Short Term Goals: Week 1:  PT Short Term Goal 1 (Week 1): Pt will consistently perform supine<>sit with mod assist PT Short Term Goal 2 (Week 1): Pt will perform sit<>stand with mod assist PT Short Term Goal 3 (Week 1): Pt will perform bed<>chair transfers with mod assist of 1 PT Short Term Goal 4 (Week 1): Pt will ambulate at least 5ft using LRAD with +2 max assist  Skilled Therapeutic Interventions/Progress Updates:   Pt received sitting in w/c reporting 10/10 low back pain and stating "I didn't sleep at all last night..I'm exhausted" and repeatedly reporting he is fatigued during session. Pt agreeable to therapy session but requesting to stay in room and lie supine for portion of session in order to help manage low back pain - RN notified for medication administration. L squat pivot to EOB with heavy mod assist for lifting/pivoting hips - cuing for head/hips relationship. Sit>supine with CGA for trunk descent and increased time for LE management onto bed. While in supine, therapist performed L UE PROM, focusing on tone management, via finger extension, wrist flexion/extension, elbow extension, shoulder external rotation, and shoulder abduction <90degrees - pt demos increased tone in biceps, pectoralis, and shoulder adductors - during this engaged pt in sustained L attention via conversation. Supine>sitting R EOB, HOB flat and using bedrail, with CGA for trunk upright. Sitting EOB working on L UE tone management via [place] and trunk control via reaching R UE cross body to touch external targets - pt demos improved ability to return to midline after L lateral trunk lean onto forearm. Provided patient with w/c back for improved trunk alignment and for pain management to tolerate increased  upright, OOB activity. R squat pivot EOB>w/c with heavier mod assist for lifting/pivoting hips as pt has increased difficulty transferring this direction due to R pushing. Pt left seated in w/c with needs in reach, seat belt alarm on, and L UE supported on pillow.  Therapy Documentation Precautions:  Precautions Precautions: Fall, Other (comment) Precaution Comments: left hemiparesis with left hemi inattention; monitor HR and BP Restrictions Weight Bearing Restrictions: No  Pain: Reports 10/10 low back pain - RN administered medication - therapist provided repositioning, alternative w/c back support, and discussed getting pt a Kpad with the RN.   Therapy/Group: Individual Therapy  Tawana Scale , PT, DPT, CSRS  07/14/2020, 8:01 AM

## 2020-07-14 NOTE — Progress Notes (Addendum)
[place] PHYSICAL MEDICINE & REHABILITATION PROGRESS NOTE   Subjective/Complaints: Pt denies dizziness in therapy yesterday , no pains except Left leg pretibial area No fall or truma Appreciate cardiology note florinef increase Hgb stable  ROS- neg CP, SOB, N/V/D  Objective:   No results found. Recent Labs    07/12/20 0432 07/13/20 0700  WBC 7.2 5.2  HGB 9.7* 9.9*  HCT 29.2* 29.5*  PLT 623* 644*   Recent Labs    07/13/20 1427  NA 137  K 3.7  CL 104  CO2 23  GLUCOSE 154*  BUN 13  CREATININE 0.94  CALCIUM 9.0    Intake/Output Summary (Last 24 hours) at 07/14/2020 0723 Last data filed at 07/14/2020 0500 Gross per 24 hour  Intake 460 ml  Output --  Net 460 ml        Physical Exam: Vital Signs Blood pressure (!) 104/59, pulse 68, temperature 97.9 F (36.6 C), temperature source Tympanic, resp. rate 16, height 6' (1.829 m), weight 78.1 kg, SpO2 97 %.  General: No acute distress Mood and affect are appropriate Heart: Regular rate and rhythm no rubs murmurs or extra sounds Lungs: Clear to auscultation, breathing unlabored, no rales or wheezes Abdomen: Positive bowel sounds, soft nontender to palpation, nondistended Extremities: No clubbing, cyanosis, or edema Skin: No evidence of breakdown, no evidence of rash  Neurologic: Cranial nerves II through XII intact, motor strength is 5/5 in right and trace left deltoid, bicep, tricep, grip, hip flexor, knee extensors, ankle dorsiflexor and plantar flexor Tone- increased knee flexor tone, MAS 3 but neg triple flexor with L foot stim Left pectoralis tone MAS 3 Musculoskeletal: Full range of motion in all 4 extremities. No joint swelling Psych: [name] affect is appropriate. Pt is cooperative     Assessment/Plan: 1. Functional deficits secondary to Right MCA infarct which require 3+ hours per day of interdisciplinary therapy in a comprehensive inpatient rehab setting.  Physiatrist is providing close team  supervision and 24 hour management of active medical problems listed below.  Physiatrist and rehab team continue to assess barriers to discharge/monitor patient progress toward functional and medical goals  Care Tool:  Bathing    Body parts bathed by patient: Left arm, Chest, Abdomen, Right upper leg, Left upper leg, Right lower leg, Face   Body parts bathed by helper: Right arm, Front perineal area, Buttocks     Bathing assist Assist Level: Maximal Assistance - Patient 24 - 49%     Upper Body Dressing/Undressing Upper body dressing   What is the patient wearing?: Pull over shirt    Upper body assist Assist Level: Total Assistance - Patient < 25%    Lower Body Dressing/Undressing Lower body dressing      What is the patient wearing?: Incontinence brief, Pants     Lower body assist Assist for lower body dressing: Total Assistance - Patient < 25%     Toileting Toileting    Toileting assist Assist for toileting: Dependent - Patient 0%     Transfers Chair/bed transfer  Transfers assist     Chair/bed transfer assist level: Moderate Assistance - Patient 50 - 74%     Locomotion Ambulation   Ambulation assist   Ambulation activity did not occur: Safety/medical concerns (required skilled intervention to ambulate 34ft at hallway rail)          Walk 10 feet activity   Assist  Walk 10 feet activity did not occur: Safety/medical concerns        Walk  50 feet activity   Assist Walk 50 feet with 2 turns activity did not occur: Safety/medical concerns         Walk 150 feet activity   Assist Walk 150 feet activity did not occur: Safety/medical concerns         Walk 10 feet on uneven surface  activity   Assist Walk 10 feet on uneven surfaces activity did not occur: Safety/medical concerns         Wheelchair     Assist Will patient use wheelchair at discharge?: No (Per PT long term goals)             Wheelchair 50 feet with 2  turns activity    Assist            Wheelchair 150 feet activity     Assist          Blood pressure (!) 104/59, pulse 68, temperature 97.9 F (36.6 C), temperature source Tympanic, resp. rate 16, height 6' (1.829 m), weight 78.1 kg, SpO2 97 %.    Medical Problem List and Plan: 1.  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  Antithrombotics: -DVT/anticoagulation: Venous Doppler studies negative.  SCDs- no heparin due to GI bleed             -antiplatelet therapy: Aspirin 81 mg daily, currently off of Brilinta given GIB and ICA has reoccluded 3. Pain Management: Tylenol as needed. Well controlled 4. Mood: Provide emotional support             -antipsychotic agents: N/A 5. Neuropsych: This patient is capable of making decisions on his own behalf. 6. Skin/Wound Care: Routine skin checks 7. Fluids/Electrolytes/Nutrition: Routine in and outs with follow-up chemistries. K+ 3.3 on 10/14- received 77meq Klor. - corrected to 4.0 on 10/15 8.  Constipation.   10/18: diarrhea s/p enema. Recommended 6-8 cups water per day to stay well hydrated 9.  Hyperlipidemia.  Lipitor 10.  Gout.  Colchicine twice daily.  Monitor for any gout flareups 11.  Orthostasis/bradycardia.  ProAmatine 5 mg every 8 hours as well as Florinef 0.1 mg daily.  Monitor with increased mobility.  Follow-up per cardiology services, Order orthostatic vitals  10/18: consulted cardiology as brady to 42 with symptomatic orthostasis despite medication.  Vitals:   07/13/20 1927 07/14/20 0448  BP: 131/60 (!) 104/59  Pulse: (!) 50 68  Resp: 17 16  Temp: 98.2 F (36.8 C) 97.9 F (36.6 C)  SpO2: 100% 97%  HR / BP in low to normal range, Florinef increased to BID by cardiology  12.  Enterobacter  UTI.  Completing course of Bactrim. 13.  Rectal bleeding.  Follow-up GI services.  Currently no plan for endoscopic studies and monitor hemoglobin hematocrit.  Awaiting plan for possible colonoscopy - Hgb stable despite visible blood in stool  stable at 9.9 on 10/18 14.  Spasticity Left hamstring and Left pectoralis which bothers pt at noc- order baclofen, avoid tizanidine due to BP issues- may need Botox during inpt stay 15.  Loose stools resolved after changing senna s to prn, cont on colchicine (may be contributing agent)   LOS: 5 days A FACE  TO FACE EVALUATION WAS PERFORMED  Charlett Blake 07/14/2020, 7:23 AM

## 2020-07-14 NOTE — Progress Notes (Signed)
Electrophysiology Rounding Note  Patient Name: Tyler Richards Date of Encounter: 07/14/2020  Primary Cardiologist: No primary care provider on file. Electrophysiologist: Dr. Quentin Ore   Subjective   No acute events overnight. No new complaints.   Inpatient Medications    Scheduled Meds: . aspirin  81 mg Oral Daily   Or  . aspirin  81 mg Per Tube Daily  . atorvastatin  40 mg Oral Daily  . baclofen  10 mg Oral QHS  . colchicine  0.6 mg Oral BID  . feeding supplement  237 mL Oral TID BM  . fludrocortisone  0.1 mg Oral BID  . midodrine  5 mg Oral Q8H  . pantoprazole  40 mg Oral Daily  . polyethylene glycol  17 g Oral Daily   Continuous Infusions:  PRN Meds: acetaminophen **OR** acetaminophen (TYLENOL) oral liquid 160 mg/5 mL **OR** acetaminophen, bisacodyl, loperamide, senna-docusate   Vital Signs    Vitals:   07/13/20 1426 07/13/20 1927 07/14/20 0448 07/14/20 0453  BP: 98/62 131/60 (!) 104/59   Pulse: (!) 53 (!) 50 68   Resp: 14 17 16    Temp: (!) 97.2 F (36.2 C) 98.2 F (36.8 C) 97.9 F (36.6 C)   TempSrc:   Tympanic   SpO2: 98% 100% 97%   Weight:    78.1 kg  Height:        Intake/Output Summary (Last 24 hours) at 07/14/2020 0804 Last data filed at 07/14/2020 0500 Gross per 24 hour  Intake 460 ml  Output --  Net 460 ml   Filed Weights   07/12/20 0500 07/13/20 0446 07/14/20 0453  Weight: 81.1 kg 78.4 kg 78.1 kg    Physical Exam    GEN- The patient is well appearing, alert and oriented x 3 today.   Head- normocephalic, atraumatic Eyes-  Sclera clear, conjunctiva pink Ears- hearing intact Oropharynx- clear Neck- supple Lungs- Clear to ausculation bilaterally, normal work of breathing Heart- Regular rate and rhythm, no murmurs, rubs or gallops GI- soft, NT, ND, + BS Extremities- no clubbing or cyanosis. No edema Skin- no rash or lesion Psych- euthymic mood, full affect Neuro- strength and sensation are intact  Labs    CBC Recent Labs     07/12/20 0432 07/13/20 0700  WBC 7.2 5.2  HGB 9.7* 9.9*  HCT 29.2* 29.5*  MCV 96.7 97.4  PLT 623* 923*   Basic Metabolic Panel Recent Labs    07/13/20 1427  NA 137  K 3.7  CL 104  CO2 23  GLUCOSE 154*  BUN 13  CREATININE 0.94  CALCIUM 9.0   Liver Function Tests No results for input(s): AST, ALT, ALKPHOS, BILITOT, PROT, ALBUMIN in the last 72 hours. No results for input(s): LIPASE, AMYLASE in the last 72 hours. Cardiac Enzymes No results for input(s): CKTOTAL, CKMB, CKMBINDEX, TROPONINI in the last 72 hours.   Telemetry    N/A (personally reviewed)  Radiology    No results found.  Patient Profile     WHITAKER HOLDERMAN is a 69 y.o. 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 Letta Pate.  Assessment & Plan    1.  Orthostasis Likely  2/2 to fluid loss in setting of loose stools throughout weekend. ?secondary to enema vs colchicine Continue midodrine 5 mg TID Continue florinef 0.1 mg to BID  2. Bradycardia HRs have been 40-50s since admission to rehab and are not clearly correlated with symptoms.  Continue watchful waiting.   3. BRBPR No further over weekend, but was having loose stools post enema. Hgb has been stable.   4. Post stroke/right ICA stenting With sinus bradycardia and pauses 2/2 hypervagatonia while in ICU.  HRs stable 40-60s but continues to have orthostasis as above.   Dr. Quentin Ore has seen and discussed attending.   For questions or updates, please contact [place] Please consult [email or web address] for contact info under Cardiology/STEMI.  Signed, Shirley Friar, PA-C  07/14/2020, 8:04 AM

## 2020-07-14 NOTE — Progress Notes (Signed)
Physical Therapy Session Note  Patient Details  Name: Tyler Richards MRN: [id number] Date of Birth: [birth year]  Today's Date: 07/14/2020 PT Individual Time: 3845-3646 PT Individual Time Calculation (min): 45 min   Short Term Goals: Week 1:  PT Short Term Goal 1 (Week 1): Pt will consistently perform supine<>sit with mod assist PT Short Term Goal 2 (Week 1): Pt will perform sit<>stand with mod assist PT Short Term Goal 3 (Week 1): Pt will perform bed<>chair transfers with mod assist of 1 PT Short Term Goal 4 (Week 1): Pt will ambulate at least 53ft using LRAD with +2 max assist  Skilled Therapeutic Interventions/Progress Updates:    Patient received supine in bed agreeable to PT. He denies pain, but endorses fatigue and "feeling weak." BP supine: 103/58. Patient able to don pants supine in bed with ModA and verbal cuing to attend to L side to pull up pants. He was able to bridge without assist to complete pulling pants up/. TED hose donned. He was able to come to sitting edge of bed with CGA. No L lateral lean of pushing noted in sitting. ModA to transfer to wc via squat pivot. PT propelling patient in wc to therapy gym for time management. Sitting BP: 101/57. He was able to complete 4 STS with ModA and verbal cuing to put weight thru L LE. Patient found to be pushing with R LE requiring manual assist to maintain weight bearing through L LE. BP after standing: 94/64 with no reports of orthostatic hypotension, only "feeling weak." Patient does require extended rest breaks between minimal therapeutic activity due to fatigue. Patient educated on importance of optimal participation in therapy to ensure safe dc home. He verbalized understanding. Patient able to engage in wc mobility using R LE, B LE. Verbal and tactile cues needed to engage L LE. Patient with minimal awareness of obstacles on L side requiring up to [place] from PT to avoid them. Patient returning to room in wc, seatbelt alarm on, call light  within reach.   Therapy Documentation Precautions:  Precautions Precautions: Fall, Other (comment) Precaution Comments: left hemiparesis with left hemi inattention; monitor HR and BP Restrictions Weight Bearing Restrictions: No    Therapy/Group: Individual Therapy  Karoline Caldwell, PT, DPT, CBIS 07/14/2020, 7:34 AM

## 2020-07-15 ENCOUNTER — Inpatient Hospital Stay (HOSPITAL_COMMUNITY): Payer: Medicare Other | Admitting: Physical Therapy

## 2020-07-15 ENCOUNTER — Inpatient Hospital Stay (HOSPITAL_COMMUNITY): Payer: Medicare Other

## 2020-07-15 ENCOUNTER — Inpatient Hospital Stay (HOSPITAL_COMMUNITY): Payer: Medicare Other | Admitting: Speech Pathology

## 2020-07-15 ENCOUNTER — Inpatient Hospital Stay (HOSPITAL_COMMUNITY): Payer: Medicare Other | Admitting: *Deleted

## 2020-07-15 ENCOUNTER — Inpatient Hospital Stay (HOSPITAL_COMMUNITY): Payer: Medicare Other | Admitting: Occupational Therapy

## 2020-07-15 LAB — CBC
HCT: 31.2 % — ABNORMAL LOW (ref 39.0–52.0)
Hemoglobin: 10.2 g/dL — ABNORMAL LOW (ref 13.0–17.0)
MCH: 31.9 pg (ref 26.0–34.0)
MCHC: 32.7 g/dL (ref 30.0–36.0)
MCV: 97.5 fL (ref 80.0–100.0)
Platelets: 606 10*3/uL — ABNORMAL HIGH (ref 150–400)
RBC: 3.2 MIL/uL — ABNORMAL LOW (ref 4.22–5.81)
RDW: 12.6 % (ref 11.5–15.5)
WBC: 5.5 10*3/uL (ref 4.0–10.5)
nRBC: 0 % (ref 0.0–0.2)

## 2020-07-15 LAB — HEMOGLOBIN AND HEMATOCRIT, BLOOD
HCT: 32.3 % — ABNORMAL LOW (ref 39.0–52.0)
Hemoglobin: 10.4 g/dL — ABNORMAL LOW (ref 13.0–17.0)

## 2020-07-15 MED ORDER — CLONAZEPAM 0.25 MG PO TBDP
0.2500 mg | ORAL_TABLET | Freq: Every day | ORAL | Status: DC
  Administered 2020-07-15 – 2020-08-02 (×19): 0.25 mg via ORAL
  Filled 2020-07-15 (×19): qty 1

## 2020-07-15 NOTE — Patient Care Conference (Signed)
Inpatient RehabilitationTeam Conference and Plan of Care Update Date: 07/15/2020   Time: 10:53 AM    Patient Name: Tyler Richards      Medical Record Number: [id number]  Date of Birth: Jun 03, 1951 Sex: Male         Room/Bed: 4M02C/4M02C-01 Payor Info: Payor: MEDICARE / Plan: MEDICARE PART A AND B / Product Type: *No Product type* /    Admit Date/Time:  07/09/2020  3:40 PM  Primary Diagnosis:  Right middle cerebral artery stroke [facility])  Hospital Problems: Principal Problem:   Right middle cerebral artery stroke [facility])    Expected Discharge Date:    Team Members Present: Physician leading conference: Dr. Alysia Penna Nurse Present: Other (comment) Demetrios Loll, RN) PT Present: Stacy Gardner, PT OT Present: Clyda Greener, OT SLP Present: Jettie Booze, CF-SLP PPS Coordinator present : Gunnar Fusi, Novella Olive, PT     Current Status/Progress Goal Weekly Team Focus  Bowel/Bladder   incontinent of b/b; LBM: 10/19  gain continences of b/b  assist with toileting needs   Swallow/Nutrition/ Hydration   Dys 2 textures, thin liquid, Supervision A use of strategies for oral clearance left pocketing, trace anterior loss solids, intermittent supervision now  Mod I  tolerance current diet and independnce with use strategies, trials Dys 3 as appropriate   ADL's   Min assist for UB bathing with mod assist for UB dressing.  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  He demonstrates left neglect with left field cut as well.  Limited sessions secondary to incontinence, fatigue, low BP.  Increased tone in the left arm at the elbow, digits, pectoral.  Currently Brunnstrum stage III in the arm and hand.  min assist  selfcare retraining, transfer training, balance retraining, neuromuscular re-education, DME education   Mobility   CGA-MinA bed mobility, ModA/MaxA squat pivot, MaxA wc mobility, ModA/MaxA  STS with pushing L in standing, unable to progress to gait d/t L flexor tone, L knee pain, L neglect/inattention  MinA grossly  L attention, transfers, sitting balance, stnading balance, gait progressions, wc mobility, family ed   Communication   90-95% intelligible sentence level/conversation, Supervision A  Mod I  carryover speech intelligibility strategies in functional conversation, recall of strategies   Safety/Cognition/ Behavioral Observations  Min A  Supervision-Mod I  complex problem solving, error awareness, selective attention, recall   Pain   c/o back pain prn tylenol  pain level <4/10  assess pain level QS and prn   Skin   skin intact  maintain skin integrity  assess skin QS and prn     Discharge Planning:  Discharging home with spouse. 2 steps to enter, r sided railings   Team Discussion: Laxative associated diarrhea, medications modified. Note post GI bleed, stools are dark/bloody. Note inconctinence of bowel and urgency with bladder management; toileting protocol initiated. Hypotension, dizziness, light headed ness addressed by MD with medications and added abd binder. Check orhtostatic BP. Spasticity in left hamstring ; MD adjusting medications and possible botox injection for leg and left pectoral tone. 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   Patient on target to meet rehab goals: no, patient is self limiting for progression at times with speech  *See Care Plan and progress notes for long and short-term goals.   Revisions to Treatment Plan:  Change to intermittent supervision for meals to monitor swallowing.  Teaching  Needs: Toileting, medications, transfers, assistance needed for care, etc.  Current Barriers to Discharge: Decreased caregiver support, Incontinence and multi medical issues and poor endurance  Possible Resolutions to Barriers: Family education     Medical Summary Current Status: Rectal bleeding continues,  hemoglobins have remained stable, soft blood pressures but no orthostatic changes, diarrhea, incontinence  Barriers to Discharge: Incontinence   Possible Resolutions to Barriers/Weekly Focus: Medication adjustment of laxatives, continue colchicine for now but may need to evaluate this as well as source of diarrhea, monitor hemoglobin and hematocrit   Continued Need for Acute Rehabilitation Level of Care: The patient requires daily medical management by a physician with specialized training in physical medicine and rehabilitation for the following reasons: Direction of a multidisciplinary physical rehabilitation program to maximize functional independence : Yes Medical management of patient stability for increased activity during participation in an intensive rehabilitation regime.: Yes Analysis of laboratory values and/or radiology reports with any subsequent need for medication adjustment and/or medical intervention. : Yes   I attest that I was present, lead the team conference, and concur with the assessment and plan of the team.   Dorien Chihuahua B 07/15/2020, 1:58 PM

## 2020-07-15 NOTE — Progress Notes (Signed)
Physical Therapy Session Note  Patient Details  Name: Tyler Richards MRN: [id number] Date of Birth: 1951-05-24  Today's Date: 07/15/2020 PT Individual Time: 0916-1027 PT Individual Time Calculation (min): 71 min   Short Term Goals: Week 1:  PT Short Term Goal 1 (Week 1): Pt will consistently perform supine<>sit with mod assist PT Short Term Goal 2 (Week 1): Pt will perform sit<>stand with mod assist PT Short Term Goal 3 (Week 1): Pt will perform bed<>chair transfers with mod assist of 1 PT Short Term Goal 4 (Week 1): Pt will ambulate at least 54ft using LRAD with +2 max assist  Skilled Therapeutic Interventions/Progress Updates:    Patient received supine in bed, agreeable to PT. He reports 7/10 back pain, premedicated. PT providing extended rest breaks and heating pack at end of therapy session for pain management. He was able to come to sitting edge of bed with CGA and verbal cuing. Patient demonstrating significant self-limiting behaviors requiring consistent cuing from PT for improved engagement/participation. ModA squat pivot to wc leading L. PT propelling patient in wc to therapy gym for time management and energy conservation. Patient completing STS with [place] x2. Mirror for visual feedback on posture. Strong L pushing noted in stance. Limited ability to fully extend L knee due to gout flare up and L hamstring tone. Verbal cuing and manual facilitation to shift weight over R LE. He was able to ambulate x8 ft, x64ft, x86ft with MaxA x2 HHA and mirror for visual feedback. Patient not putting full weight through L LE in stance resulting in him hopping on R LE and further increasing his pushing to the L. Patient minimally responsive to verbal cuing to correct posture as he perseverates on feeling nauseous, having a headache or backache. Difficult to redirect patient to task. BP after standing ~2 mins: 111/78 with TED hose and abd binder on. BP after ambulating 59ft: 109/73. Patient returning to bed  via squat pivot ModA. ModA for sit > supine likely related to self-limiting behaviors. Hot pack placed on low back, positioned on R side. Bed alarm on, call light within reach.   Therapy Documentation Precautions:  Precautions Precautions: Fall, Other (comment) Precaution Comments: left hemiparesis with left hemi inattention; monitor HR and BP Restrictions Weight Bearing Restrictions: No    Therapy/Group: Individual Therapy  Karoline Caldwell, PT, DPT, CBIS 07/15/2020, 7:47 AM

## 2020-07-15 NOTE — Progress Notes (Signed)
Orthopedic Tech Progress Note Patient Details:  MEILECH VIRTS 10-Feb-1951 [id number] RN called requesting an abdominal binder patient was working with therapy so I gave it to them Ortho Devices Type of Ortho Device: Abdominal binder Ortho Device/Splint Location: STOMACH Ortho Device/Splint Interventions: Other (comment)   Post Interventions Patient Tolerated: Well Instructions Provided: Care of device   Janit Pagan 07/15/2020, 9:05 AM

## 2020-07-15 NOTE — Progress Notes (Signed)
Patient did not sleep very well over night. Vitals WDL for patient. Very anxious throughout the night.

## 2020-07-15 NOTE — Progress Notes (Signed)
Occupational Therapy Session Note  Patient Details  Name: Tyler Richards MRN: [id number] Date of Birth: [birth year]  Today's Date: 07/15/2020 OT Individual Time: 6789-3810 OT Individual Time Calculation (min): 47 min    Short Term Goals: Week 1:  OT Short Term Goal 1 (Week 1): Pt will maintain static sitting balance EOB in perparation for selfcare tasks with supervision for at least three mins. OT Short Term Goal 2 (Week 1): Pt will complete LB bathing sit to stand with max assist for two consecutive sessions. OT Short Term Goal 3 (Week 1): Pt will complete toilet transfer squat pivot with max assist . OT Short Term Goal 4 (Week 1): Pt will donn a pullover shirt with no more than mod assist for two consecutive sessions.  Skilled Therapeutic Interventions/Progress Updates:    Pt working on eating to start session in bed , agreeable to continue working on this in sitting on the side of the bed.  Therapist assisted with donning TEDs with BP taken in supine at 91/49 with HR at 47.  He needed min assist for transfer to the EOB and was able to maintain sitting balance with supervision while working on self feeding with the RUE.  Items were placed on the left of the bedside table and he was able to locate them to self feed without verbal cueing.  BP taken in sitting at 97/51 with HR at 51 as well.  Worked on donning pants sit to stand with total +2  (pt 60%) for standing.  Increased light headedness in standing with BP at 81/41 with HR at 126.  He did exhibit slight BM in the brief, so changed this while standing with total +2.  Pt tolerated standing for approximately three mins.  He sat back down with BP taken at 84/64 and HR at 98.  Returned to supine to rest with mod assist for lifting the LEs into the bed.  He was left in the bed with the call button and phone in reach and safety alarm in place.  Pt positioned on his side for pressure relief.    Therapy Documentation Precautions:   Precautions Precautions: Fall, Other (comment) Precaution Comments: left hemiparesis with left hemi inattention; monitor HR and BP, TEDs and abdominal binder when up Restrictions Weight Bearing Restrictions: No  Pain: Pain Assessment Pain Scale: 0-10 Pain Score: 5  Faces Pain Scale: Hurts a little bit Pain Type: Acute pain Pain Location: Back Pain Orientation: Lower Pain Descriptors / Indicators: Dull;Aching Pain Onset: Gradual Patients Stated Pain Goal: 2 Pain Intervention(s): Medication (See eMAR) Multiple Pain Sites: No ADL: See Care Tool for some details of mobility and selfcare  Therapy/Group: Individual Therapy  Sinahi Knights OTR/L 07/15/2020, 10:37 AM

## 2020-07-15 NOTE — Progress Notes (Signed)
Physical Therapy Session Note  Patient Details  Name: Tyler Richards MRN: [id number] Date of Birth: 07-15-1951  Today's Date: 07/15/2020 PT Individual Time: 1300-1330 PT Individual Time Calculation (min): 30 min   Short Term Goals: Week 1:  PT Short Term Goal 1 (Week 1): Pt will consistently perform supine<>sit with mod assist PT Short Term Goal 2 (Week 1): Pt will perform sit<>stand with mod assist PT Short Term Goal 3 (Week 1): Pt will perform bed<>chair transfers with mod assist of 1 PT Short Term Goal 4 (Week 1): Pt will ambulate at least 15ft using LRAD with +2 max assist  Skilled Therapeutic Interventions/Progress Updates:    Patient received supine in bed eating lunch, wife at bedside. He denies pain at this time- hot pack on his lowback. Patient capable of feeding himself physically and cognitively, however, wife and patient insist that she feeds him. Patient demonstrating impulsive behavior, sitting up on edge of bed before PT ready and before donning abd binder. Binder donned in sitting. ModA squat pivot to wc. PT propelling patient in wc to therapy gym for time management and energy conservation. He was able to complete STS with MinA x2 HHA and mirror for visual feedback. Verbal cuing to shift weight to R. Improved ability to maintain this appropriate weightshift and maintain midline. He was able to ambulate 69ft with ModA-MaxA (for turns) x2 with HHA. Verbal cues needed to look at mirror for visual feedback on posture. Patient with episode of bowel incontinence while ambulating. ModA squat pivot transferring back to bed. TotalA pericare supine in bed. Patient improving ability to roll with supervision using bed rails. Bed alarm on, call light within reach, wife at bedside.   Therapy Documentation Precautions:  Precautions Precautions: Fall, Other (comment) Precaution Comments: left hemiparesis with left hemi inattention; monitor HR and BP Restrictions Weight Bearing Restrictions:  No    Therapy/Group: Individual Therapy  Karoline Caldwell, PT, DPT, CBIS 07/15/2020, 7:44 AM

## 2020-07-15 NOTE — Progress Notes (Signed)
[facility] PHYSICAL MEDICINE & REHABILITATION PROGRESS NOTE   Subjective/Complaints: Up a lot last noc flet like he was anxious , also Left thigh spasms (hamstring)  Tailbone hurts denies fall ROS- neg CP, SOB, N/V/D  Objective:   No results found. Recent Labs    07/13/20 0700 07/15/20 0606  WBC 5.2 5.5  HGB 9.9* 10.2*  HCT 29.5* 31.2*  PLT 644* 606*   Recent Labs    07/13/20 1427  NA 137  K 3.7  CL 104  CO2 23  GLUCOSE 154*  BUN 13  CREATININE 0.94  CALCIUM 9.0    Intake/Output Summary (Last 24 hours) at 07/15/2020 0746 Last data filed at 07/14/2020 1833 Gross per 24 hour  Intake 195 ml  Output 125 ml  Net 70 ml        Physical Exam: Vital Signs Blood pressure (!) 104/54, pulse (!) 41, temperature 98.2 F (36.8 C), resp. rate 16, height 6' (1.829 m), weight 78.4 kg, SpO2 98 %.  General: No acute distress Mood and affect are appropriate Heart: Regular rate and rhythm no rubs murmurs or extra sounds Lungs: Clear to auscultation, breathing unlabored, no rales or wheezes Abdomen: Positive bowel sounds, soft nontender to palpation, nondistended Extremities: No clubbing, cyanosis, or edema Skin: No evidence of breakdown, no evidence of rash   Neurologic: Cranial nerves II through XII intact, motor strength is 5/5 in right and trace left deltoid, bicep, tricep, grip, hip flexor, knee extensors, ankle dorsiflexor and plantar flexor Tone- increased knee flexor tone, MAS 3 but neg triple flexor with L foot stim Left pectoralis tone MAS 3 Musculoskeletal: Full range of motion in all 4 extremities. No joint swelling Psych: [name] affect is appropriate. Pt is cooperative     Assessment/Plan: 1. Functional deficits secondary to Right MCA infarct which require 3+ hours per day of interdisciplinary therapy in a comprehensive inpatient rehab setting.  Physiatrist is providing close team supervision and 24 hour management of active medical problems listed  below.  Physiatrist and rehab team continue to assess barriers to discharge/monitor patient progress toward functional and medical goals  Care Tool:  Bathing    Body parts bathed by patient: Left arm, Chest, Abdomen, Right upper leg, Left upper leg, Right lower leg, Face   Body parts bathed by helper: Right arm, Front perineal area, Buttocks     Bathing assist Assist Level: Maximal Assistance - Patient 24 - 49%     Upper Body Dressing/Undressing Upper body dressing   What is the patient wearing?: Pull over shirt    Upper body assist Assist Level: Total Assistance - Patient < 25%    Lower Body Dressing/Undressing Lower body dressing      What is the patient wearing?: Incontinence brief, Pants     Lower body assist Assist for lower body dressing: Total Assistance - Patient < 25%     Toileting Toileting    Toileting assist Assist for toileting: Total Assistance - Patient < 25%     Transfers Chair/bed transfer  Transfers assist     Chair/bed transfer assist level: Moderate Assistance - Patient 50 - 74%     Locomotion Ambulation   Ambulation assist   Ambulation activity did not occur: Safety/medical concerns (required skilled intervention to ambulate 54ft at hallway rail)          Walk 10 feet activity   Assist  Walk 10 feet activity did not occur: Safety/medical concerns        Walk 50 feet activity  Assist Walk 50 feet with 2 turns activity did not occur: Safety/medical concerns         Walk 150 feet activity   Assist Walk 150 feet activity did not occur: Safety/medical concerns         Walk 10 feet on uneven surface  activity   Assist Walk 10 feet on uneven surfaces activity did not occur: Safety/medical concerns         Wheelchair     Assist Will patient use wheelchair at discharge?: Yes Type of Wheelchair: Manual    Wheelchair assist level: Maximal Assistance - Patient 25 - 49% Max wheelchair distance: 50     Wheelchair 50 feet with 2 turns activity    Assist        Assist Level: Maximal Assistance - Patient 25 - 49%   Wheelchair 150 feet activity     Assist          Blood pressure (!) 104/54, pulse (!) 41, temperature 98.2 F (36.8 C), resp. rate 16, height 6' (1.829 m), weight 78.4 kg, SpO2 98 %.    Medical Problem List and Plan: 1.  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 Formulized individual treatment plan based on medical history, underlying problem and comorbidities.   Continue CIR PT, OT, SLP 2.  Antithrombotics: -DVT/anticoagulation: Venous Doppler studies negative.  SCDs- no heparin due to GI bleed             -antiplatelet therapy: Aspirin 81 mg daily, currently off of Brilinta given GIB and ICA has reoccluded 3. Pain Management: Tylenol as needed. Well controlled 4. Mood: Provide emotional support             -antipsychotic agents: N/A 5. Neuropsych: This patient is capable of making decisions on his own behalf. 6. Skin/Wound Care: Routine skin checks 7. Fluids/Electrolytes/Nutrition: Routine in and outs with follow-up chemistries. K+ 3.3 on 10/14- received 60meq Klor. - corrected to 4.0 on 10/15 8.  Constipation.   10/18: diarrhea s/p enema. Recommended 6-8 cups water per day to stay well hydrated 9.  Hyperlipidemia.  Lipitor 10.  Gout.  Colchicine twice daily.  Monitor for any gout flareups 11.  Orthostasis/bradycardia.  ProAmatine 5 mg every 8 hours as well as Florinef 0.1 mg daily.  Monitor with increased mobility.  Follow-up per cardiology services, Order orthostatic vitals  10/18: consulted cardiology as brady to 42 with symptomatic  orthostasis despite medication.  Vitals:   07/14/20 1957 07/15/20 0302  BP: (!) 104/56 (!) 104/54  Pulse: (!) 46 (!) 41  Resp: 16 16  Temp: 99.8 F (37.7 C) 98.2 F (36.8 C)  SpO2: 96% 98%  HR / BP in low to normal range, Florinef increased to BID by cardiology , discussed with Dr Chalmers Cater  12.  Enterobacter UTI.  Completing course of Bactrim. 13.  Rectal bleeding.  Follow-up GI services.  Currently no plan for endoscopic studies and monitor hemoglobin hematocrit.  Awaiting plan for possible colonoscopy - Hgb stable despite visible blood in stool  stable at 9.9 on 10/18. 10.2 on 10/20 14.  Spasticity Left hamstring and Left  pectoralis which bothers pt at noc- order baclofen, avoid tizanidine due to BP issues- may need Botox during inpt stay Leg spasms and anxiety will d/c baclofen trial klonopin at noc monitor for increased confusion  15.  Loose stools resolved after changing senna s to prn, cont on colchicine (may be contributing agent)   LOS: 6 days A FACE TO FACE EVALUATION WAS PERFORMED  Charlett Blake 07/15/2020, 7:46 AM

## 2020-07-15 NOTE — Progress Notes (Signed)
Speech Language Pathology Daily Session Note  Patient Details  Name: Tyler Richards MRN: [id number] Date of Birth: 1951-01-30  Today's Date: 07/15/2020 SLP Individual Time: 1779-3903 SLP Individual Time Calculation (min): 55 min  Short Term Goals: Week 1: SLP Short Term Goal 1 (Week 1): Pt will demonstrate efficient mastication and oral clearance and minimal overt s/sx aspiration on current (recently upgraded) Dys 2 (minced/ground) texture diet with thin liquids and no more than Supervision A cues for use of compensatory swallow strategies. SLP Short Term Goal 2 (Week 1): Pt will consume trials of Dys 3 (mech soft) solids X3 with efficient mastication and oral clearance and no more than Supervision A cues required for use of swallow straetgies prior to advancement. SLP Short Term Goal 3 (Week 1): Pt will demonstrate ability to problem solve mildly complex to complex tasks with Min A verbal/visual cues. SLP Short Term Goal 4 (Week 1): Pt will selectively attend to tasks with Min A verbal cues for redirection. SLP Short Term Goal 5 (Week 1): Pt will detect functional errors wtih Min A verbal/visual cues. SLP Short Term Goal 6 (Week 1): Pt will use speech intelligibility strategies to achieve 95% intelligibility in conversation with Min A verbal cues.  Skilled Therapeutic Interventions: Pt was seen for skilled ST targeting cognitive goals. Wife present for session. SLP facilitated session with a complex medication management task. Pt with relatively good recall of current medication functions (~60%). He used list of current meds to organize a TID pill box with overall Max A verbal and visual cues required for error awareness, Mod A for functional problem solving and organization. Pt and SLP identified 2 strategies that seemed to increase his accuracy and functional problem solving including "closing the doors" of the pill organizer after putting in a pill, and completing whole rows as opposed to columns  on pills that are to be administered 2X or 3X daily. He also required Min-Mod A verbal cues for visual scanning to turn head left within task and throughout other functional tasks during session (ex: locating cup of water slightly left on tray). Would recommend repeating medication management task to assess carryover of skills/strategies and to promote greater independence with this task (as pt reports he used to manage his wife's medications prior to admission). Pt left laying in bed with alarm set and needs within reach, wife still present. Continue per current plan of care.        Pain Pain Assessment Pain Scale: 0-10 Pain Score: 0-No pain Therapy/Group: Individual Therapy  Arbutus Leas 07/15/2020, 2:59 PM

## 2020-07-15 NOTE — Progress Notes (Signed)
   07/15/20 1224  Assess: MEWS Score  Temp 98.5 F (36.9 C)  BP (!) 99/51  Pulse Rate (!) 48  Resp 16  Assess: MEWS Score  MEWS Temp 0  MEWS Systolic 1  MEWS Pulse 1  MEWS RR 0  MEWS LOC 0  MEWS Score 2  MEWS Score Color Yellow  Assess: if the MEWS score is Yellow or Red  Were vital signs taken at a resting state? Yes  Focused Assessment No change from prior assessment  Early Detection of Sepsis Score *See Row Information* Low  MEWS guidelines implemented *See Row Information* Yes  Treat  Pain Scale 0-10  Pain Score 5  Faces Pain Scale 2  Pain Type Acute pain  Pain Location Knee  Pain Orientation Left  Pain Descriptors / Indicators Aching;Dull  Pain Frequency Intermittent  Pain Onset Gradual  Patients Stated Pain Goal 2  Pain Intervention(s) Medication (See eMAR)  Multiple Pain Sites No  Take Vital Signs  Increase Vital Sign Frequency  Yellow: Q 2hr X 2 then Q 4hr X 2, if remains yellow, continue Q 4hrs  Escalate  MEWS: Escalate Yellow: discuss with charge nurse/RN and consider discussing with provider and RRT  Notify: Charge Nurse/RN  Name of Charge Nurse/RN Notified Hillary, RN  Date Charge Nurse/RN Notified 07/15/20  Time Charge Nurse/RN Notified 1324

## 2020-07-15 NOTE — Progress Notes (Signed)
Patient had small amount of dark red blood in brief.  Sallye Lat notified.  Orders to repeat H&H and to be called with results by night shift.  Zella Ball stated, "I will look at patients chart and contact night shift nurse with any further orders."

## 2020-07-15 NOTE — Progress Notes (Signed)
Recreational Therapy Session Note  Patient Details  Name: Tyler Richards MRN: [id number] Date of Birth: 1951-07-09 Today's Date: 07/15/2020  Pain: no c/o Skilled Therapeutic Interventions/Progress Updates: Scheduled for TR eval, pt in bed asleep, did not awaken upon entry.  Notes indicate that pt did not sleep well last night.  Will attempt eval at a later date. [place] 07/15/2020, 3:58 PM

## 2020-07-15 NOTE — Progress Notes (Signed)
Received a call from [facility] at [phone] reporting, Mr. Guay passed a blood clot. Labs was reviewed. Dr Letta Pate note was reviewed. Read Dr. Havery Moros consult note from 07/05/2020, recommending a colonoscopy once he is cleared for anesthesia. H&H ordered and the above will be discussed with Dr Letta Pate. We will continue to monitor. Cardiology was consulted on 07/13/2020 for the evaluation of orthostatic hypotension and bradycardia, Dr Lovena Le note was reviewed. We will continue to monitor, blood work pending at this time.

## 2020-07-15 NOTE — Progress Notes (Signed)
Patient ID: Tyler Richards, male   DOB: 1950-12-21, 69 y.o.   MRN: [id number] Team Conference Report to Patient/Family  Team Conference discussion was reviewed with the patient and caregiver, including goals, any changes in plan of care and target discharge date.  Patient and caregiver express understanding and are in agreement.  The patient has a target discharge date of  .  [name] 07/15/2020, 1:46 PM

## 2020-07-16 ENCOUNTER — Inpatient Hospital Stay (HOSPITAL_COMMUNITY): Payer: Medicare Other | Admitting: Occupational Therapy

## 2020-07-16 ENCOUNTER — Inpatient Hospital Stay (HOSPITAL_COMMUNITY): Payer: Medicare Other | Admitting: Speech Pathology

## 2020-07-16 ENCOUNTER — Inpatient Hospital Stay (HOSPITAL_COMMUNITY): Payer: Medicare Other | Admitting: Physical Therapy

## 2020-07-16 NOTE — Progress Notes (Signed)
Physical Therapy Session Note  Patient Details  Name: Tyler Richards MRN: [id number] Date of Birth: 08-09-1951  Today's Date: 07/16/2020 PT Individual Time: 3016-0109 PT Individual Time Calculation (min): 78 min   Short Term Goals: Week 1:  PT Short Term Goal 1 (Week 1): Pt will consistently perform supine<>sit with mod assist PT Short Term Goal 2 (Week 1): Pt will perform sit<>stand with mod assist PT Short Term Goal 3 (Week 1): Pt will perform bed<>chair transfers with mod assist of 1 PT Short Term Goal 4 (Week 1): Pt will ambulate at least 17ft using LRAD with +2 max assist  Skilled Therapeutic Interventions/Progress Updates:    Pt received sitting in recliner with his wife present and pt agreeable to therapy session. Vitals assessed during session as noted below - pt wearing B LE thigh high TEDs and abdominal binder for BP management. Pt noted to be soiled in urine through to his clothes and on the floor - therapist reiterated to nursing staff about timed toileting and educated pt's wife on importance of calling for nursing staff to perform peri-care if incontinence occurs in order to maintain pt's skin integrity. Also, educated pt/wife on importance of having nursing staff assist with pressure relief as pt continues to report sacral pain from prolonged sitting. Sit>stand recliner>stedy with heavy mod assist for lifting and balance due to strong pushing towards L. In stedy, standing at sink mirror for visual feedback performed LB clothing management and peri-care total assist with pt able to maintain stance in midline with mod cuing and external target used to promote R weight shift. Sitting in w/c therapist donned clean LB clothing and shoes max assist for time management - pt able to lift LEs to assist with this task. Sit<>stand to/from w/c using R UE support on bedrail again with heavy mod assist of 1 due to strong L lean from pusher tendencies while therapist pulled pants up over hips total  assist. Transported to/from gym in w/c for time management and energy conservation. Gait training ~29ft, ~66ft, ~48ft with +2 mod assist via 3 Musketeer support (3rd person for w/c follow) - demos ability to advance L LE during swing without assist but excessive hip adduction noted then demos L posterior lean into a squat type posture while stepping R LE forward therefore therapist manually facilitating increased L hip/knee extension and pelvic advancement over the LE during stance - pt demoing improving gait mechanics each trial. Transported back to room and pt requesting to get in bed. R squat pivot w/c>EOB with mod assist for lifting/pivoting hips - pt demoing improving head/hips relationship and trunk/pelvis disassociation along with decreased pushing during transfers this direction. Therapeutically positioned in R sidelying with pillows for pressure relief - promoting increased L attention as pt continues to demo R gaze preference. Left with needs in reach, bed alarm on, and pt's wife present.   Vitals assessed during session:  Sitting in recliner: BP 109/62 (MAP 77), HR 53bpm After 1st ambulation: BP 122/71 (MAP 87), HR 61bpm After final ambulation: BP 137/63 (MAP 85), HR 78bpm   Therapy Documentation Precautions:  Precautions Precautions: Fall, Other (comment) Precaution Comments: left hemiparesis with left hemi inattention; monitor HR and BP, TEDs and abdominal binder when up Restrictions Weight Bearing Restrictions: No  Pain:   Reports sacral pain from prolonged sitting otherwise no reports of pain during session.   Therapy/Group: Individual Therapy  Tawana Scale , PT, DPT, CSRS  07/16/2020, 1:22 PM

## 2020-07-16 NOTE — Progress Notes (Signed)
Speech Language Pathology Daily Session Note  Patient Details  Name: Tyler Richards MRN: [id number] Date of Birth: 26-Jul-1951  Today's Date: 07/16/2020 SLP Individual Time: 1000-1100 SLP Individual Time Calculation (min): 60 min  Short Term Goals: Week 1: SLP Short Term Goal 1 (Week 1): Pt will demonstrate efficient mastication and oral clearance and minimal overt s/sx aspiration on current (recently upgraded) Dys 2 (minced/ground) texture diet with thin liquids and no more than Supervision A cues for use of compensatory swallow strategies. SLP Short Term Goal 2 (Week 1): Pt will consume trials of Dys 3 (mech soft) solids X3 with efficient mastication and oral clearance and no more than Supervision A cues required for use of swallow straetgies prior to advancement. SLP Short Term Goal 3 (Week 1): Pt will demonstrate ability to problem solve mildly complex to complex tasks with Min A verbal/visual cues. SLP Short Term Goal 4 (Week 1): Pt will selectively attend to tasks with Min A verbal cues for redirection. SLP Short Term Goal 5 (Week 1): Pt will detect functional errors wtih Min A verbal/visual cues. SLP Short Term Goal 6 (Week 1): Pt will use speech intelligibility strategies to achieve 95% intelligibility in conversation with Min A verbal cues.  Skilled Therapeutic Interventions:   Patient seen with wife present for skilled ST session focusing on complex level problem solving with medication management (pill box) task. Patient required modA cues to find errors after placing medications in appropriate containers in pill box, as well as visual cues secondary to patient not demonstrating appropriate tracking (missing day of week in pill box, jumping to next line when reading, etc). He was able to correct errors when SLP directed him to locate them but SLP was not able to decrease cue frequency during this task. Patient did state he felt he performed better today as compared to yesterday. Patient  continues to benefit from skilled SLP intervention to maximize cognitive-linguistic and swallow function prior to discharge.  Pain Pain Assessment Pain Scale: 0-10 Pain Score: 0-No pain  Therapy/Group: Individual Therapy   Sonia Baller, MA, CCC-SLP Speech Therapy

## 2020-07-16 NOTE — Progress Notes (Signed)
Patient ID: Tyler Richards, male   DOB: [birth year], 70 y.o.   MRN: [id number]  Family education scheduled November 4,5,6 (9-12)  [place], [place]

## 2020-07-16 NOTE — Progress Notes (Signed)
Occupational Therapy Weekly Progress Note  Patient Details  Name: Tyler Richards MRN: [id number] Date of Birth: 08/11/1951  Beginning of progress report period: July 09, 2020 End of progress report period: July 16, 2020  Today's Date: 07/16/2020 OT Individual Time: 0801-0900 OT Individual Time Calculation (min): 59 min    Patient has met 3 of 4 short term goals.  Tyler Richards is making steady progress with OT however, he continues to be limited at times secondary to increased fatigue as well as hypotension.  He is able to complete supine to sit with min to mod assist in preparation for selfcare tasks EOB.  He needs max instructional cueing to sequence through bathing, with min assist for UB and max assist for LB sit to stand.  UB dressing and LB dressing are still max assist as well.  He completes sit to stand during LB selfcare with mod assist secondary to increased pushing to the left side and for facilitation of the left knee extensors.  He has had a history of gout in the left knee as well as some flexor tone, making it difficult to fully extend the knee.  He completes toilet transfer with max assist to the [facility] with total +2 (pt 40%) for completion of toileting tasks.  Left neglect is still present as well as left visual field deficit.  Tyler Richards continues with left hemiparesis and is currently Brunnstrum stage III in the arm and stage II in the hand.  Increased tone is noted in the finger flexors with moderate tone in the elbow flexors and shoulder adductors.  He is tolerating resting hand splint for night time wear.  Feel he is on target to meet min assist level goals.  Will benefit from continued CIR level OT to continue progress and to continue building up his endurance for greater activity tolerance.  Planned discharge on 11/10 at this time with 24 hr assist from his spouse.  Patient continues to demonstrate the following deficits: muscle weakness and muscle paralysis, impaired timing and  sequencing, abnormal tone, unbalanced muscle activation and decreased coordination, field cut, decreased attention to left and left side neglect, decreased attention, decreased problem solving, decreased safety awareness, decreased memory and delayed processing and decreased sitting balance, decreased standing balance, decreased postural control, hemiplegia and decreased balance strategies and therefore will continue to benefit from skilled OT intervention to enhance overall performance with BADL and Reduce care partner burden.  Patient progressing toward long term goals..  Continue plan of care.  OT Short Term Goals Week 2:  OT Short Term Goal 1 (Week 2): Pt will donn a pullover shirt with no more than mod assist for two consecutive sessions. OT Short Term Goal 2 (Week 2): Pt will complete LB bathing wiht mod assist sit to stand for two consecutive session. OT Short Term Goal 3 (Week 2): Pt will complete toilet transfers with mod asisst squat pivot or stand pivot. OT Short Term Goal 4 (Week 2): Pt will complete toileting tasks sit to stand with mod assist.  Skilled Therapeutic Interventions/Progress Updates:    Pt completed bathing and dressing sit to stand from the EOB.  He was able to transfer to sitting with min assist from supine and HOB elevated as pt was working on breakfast.  He was able to maintain static sitting balance EOB with supervision and increased midline orientation.  Noted increased flexor tone in the left biceps, so while sitting therapist complete gently stretching of the elbow flexors to place the  LUE in weightbearing on the side of the bed.  He was able to then work on bathing with overall max instructional cueing for sequencing as well as min assist for washing his UB and max assist for washing LB sit to stand.  Increased pushing to the left and LOB noted when trying to cross his LEs or when reaching down to wash the right foot.  Mod assist for dynamic sitting balance during these  tasks.  Next, he needed max assist with overall max instructional cueing for hemidressing of the UB and LB.  BP was taken in sitting at 128/84 as well as 102/59.  HR remained in the 40-50 range.  He finished session with transfer to the recliner at max assist for stand pivot transfer.  He was left sitting with the safety belt in place and call button and phone in reach.  His spouse was present in the room as well.   Therapy Documentation Precautions:  Precautions Precautions: Fall, Other (comment) Precaution Comments: left hemiparesis with left hemi inattention; monitor HR and BP, TEDs and abdominal binder when up Restrictions Weight Bearing Restrictions: No  Pain:  See Pain Flowsheet ADL: See Care Tool Section for some details of mobility and selfcare  Therapy/Group: Individual Therapy  Taje Tondreau OTR/L 07/16/2020, 12:18 PM

## 2020-07-16 NOTE — Progress Notes (Addendum)
[facility] PHYSICAL MEDICINE & REHABILITATION PROGRESS NOTE   Subjective/Complaints: Pt still passing blood clots (as expected) but Hgb remains stable, no abd pain , no hematemesis.  Spoke with pt and wife about rationale for delaying endoscopy until pt is further out from CVA Also discussed spasticity, sleep better on low dose Klonopin ROS- neg CP, SOB, N/V/D  Objective:   No results found. Recent Labs    07/15/20 0606 07/15/20 2036  WBC 5.5  --   HGB 10.2* 10.4*  HCT 31.2* 32.3*  PLT 606*  --    Recent Labs    07/13/20 1427  NA 137  K 3.7  CL 104  CO2 23  GLUCOSE 154*  BUN 13  CREATININE 0.94  CALCIUM 9.0    Intake/Output Summary (Last 24 hours) at 07/16/2020 0740 Last data filed at 07/15/2020 1845 Gross per 24 hour  Intake 380 ml  Output --  Net 380 ml        Physical Exam: Vital Signs Blood pressure 104/60, pulse (!) 42, temperature 98.2 F (36.8 C), temperature source Oral, resp. rate 16, height 6' (1.829 m), weight 77.6 kg, SpO2 97 %.  General: No acute distress Mood and affect are appropriate Heart: Regular rate and rhythm no rubs murmurs or extra sounds Lungs: Clear to auscultation, breathing unlabored, no rales or wheezes Abdomen: Positive bowel sounds, soft nontender to palpation, nondistended Extremities: No clubbing, cyanosis, or edema Skin: No evidence of breakdown, no evidence of rash   Neurologic: Cranial nerves II through XII intact, motor strength is 5/5 in right and trace left deltoid, bicep, tricep, grip, hip flexor, knee extensors, ankle dorsiflexor and plantar flexor Tone- increased knee flexor tone, MAS 3 but neg triple flexor with L foot stim Left pectoralis tone MAS 3 Musculoskeletal: Full range of motion in all 4 extremities. No joint swelling Psych: [name] affect is appropriate. Pt is cooperative   Assessment/Plan: 1. Functional deficits secondary to Right MCA infarct which require 3+ hours per day of interdisciplinary therapy  in a comprehensive inpatient rehab setting.  Physiatrist is providing close team supervision and 24 hour management of active medical problems listed below.  Physiatrist and rehab team continue to assess barriers to discharge/monitor patient progress toward functional and medical goals  Care Tool:  Bathing    Body parts bathed by patient: Left arm, Chest, Abdomen, Right upper leg, Left upper leg, Right lower leg, Face   Body parts bathed by helper: Right arm, Front perineal area, Buttocks     Bathing assist Assist Level: Maximal Assistance - Patient 24 - 49%     Upper Body Dressing/Undressing Upper body dressing   What is the patient wearing?: Pull over shirt    Upper body assist Assist Level: Total Assistance - Patient < 25%    Lower Body Dressing/Undressing Lower body dressing      What is the patient wearing?: Incontinence brief, Pants     Lower body assist Assist for lower body dressing: 2 Helpers (sit to stand)     Chartered loss adjuster assist Assist for toileting: Total Assistance - Patient < 25%     Transfers Chair/bed transfer  Transfers assist     Chair/bed transfer assist level: Maximal Assistance - Patient 25 - 49%     Locomotion Ambulation   Ambulation assist   Ambulation activity did not occur: Safety/medical concerns (required skilled intervention to ambulate 47ft at hallway rail)  Assist level: 2 helpers Assistive device: Hand held assist Max distance:  30   Walk 10 feet activity   Assist  Walk 10 feet activity did not occur: Safety/medical concerns  Assist level: 2 helpers Assistive device: Hand held assist   Walk 50 feet activity   Assist Walk 50 feet with 2 turns activity did not occur: Safety/medical concerns         Walk 150 feet activity   Assist Walk 150 feet activity did not occur: Safety/medical concerns         Walk 10 feet on uneven surface  activity   Assist Walk 10 feet on uneven surfaces  activity did not occur: Safety/medical concerns         Wheelchair     Assist Will patient use wheelchair at discharge?: Yes Type of Wheelchair: Manual    Wheelchair assist level: Maximal Assistance - Patient 25 - 49% Max wheelchair distance: 50    Wheelchair 50 feet with 2 turns activity    Assist        Assist Level: Maximal Assistance - Patient 25 - 49%   Wheelchair 150 feet activity     Assist          Blood pressure 104/60, pulse (!) 42, temperature 98.2 F (36.8 C), temperature source Oral, resp. rate 16, height 6' (1.829 m), weight 77.6 kg, SpO2 97 %.    Medical Problem List and Plan: 1.  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  Antithrombotics: -DVT/anticoagulation: Venous Doppler studies negative.  SCDs- no heparin due to GI bleed             -antiplatelet therapy: Aspirin 81 mg daily, currently off of Brilinta given GIB and ICA has reoccluded 3. Pain Management: Tylenol as needed. Well controlled 4. Mood: Provide emotional support             -antipsychotic agents: N/A 5. Neuropsych: This patient is capable of making decisions on his own behalf. 6. Skin/Wound Care: Routine skin checks 7. Fluids/Electrolytes/Nutrition: Routine in and outs with follow-up chemistries. K+ 3.3 on 10/14- received 32meq Klor. - corrected to 4.0 on 10/15 8.  Constipation.   10/18: diarrhea s/p enema. Recommended 6-8 cups water per day to stay well hydrated 9.  Hyperlipidemia.  Lipitor 10.  Gout.  Colchicine twice daily.Hold due to diarrhea, most recently had flare up in foot and RIght elbow   Monitor for any gout flareups 11.  Orthostasis/bradycardia.  ProAmatine 5 mg every 8 hours as well as Florinef 0.1 mg daily.  Monitor with increased mobility.  Follow-up per  cardiology services, Order orthostatic vitals  10/18: consulted cardiology as brady to 42 with symptomatic orthostasis despite medication.  Vitals:   07/16/20 0019 07/16/20 0301  BP: (!) 110/54 104/60  Pulse: (!) 40 (!) 42  Resp: 16 16  Temp: 98.3 F (36.8 C) 98.2 F (36.8 C)  SpO2: 96% 97%  HR / BP in low to normal range, Florinef increased to BID by cardiology , discussed with Dr Chalmers Cater  12.  Enterobacter UTI.  Completing course of Bactrim. 13.  Rectal bleeding.  Follow-up GI services.  Currently no plan for endoscopic studies and monitor hemoglobin hematocrit.  Awaiting plan for possible colonoscopy - Hgb stable despite  visible blood in stool  stable at 9.9 on 10/18. 10.2 on 10/20 14.  Spasticity Left hamstring and Left pectoralis which bothers pt at noc- order baclofen, avoid tizanidine due to BP issues- may need Botox during inpt stay Leg spasms and anxiety will d/c baclofen trial klonopin at noc monitor for increased confusion  15.  Loose stoolssome improvement after changing senna s to prn, will stop colchicine (pt took prn not daily at home  LOS: 7 days A FACE TO FACE EVALUATION WAS PERFORMED  Charlett Blake 07/16/2020, 7:40 AM

## 2020-07-17 ENCOUNTER — Inpatient Hospital Stay (HOSPITAL_COMMUNITY): Payer: Medicare Other

## 2020-07-17 ENCOUNTER — Inpatient Hospital Stay (HOSPITAL_COMMUNITY): Payer: Medicare Other | Admitting: Speech Pathology

## 2020-07-17 ENCOUNTER — Inpatient Hospital Stay (HOSPITAL_COMMUNITY): Payer: Medicare Other | Admitting: Physical Therapy

## 2020-07-17 ENCOUNTER — Inpatient Hospital Stay (HOSPITAL_COMMUNITY): Payer: Medicare Other | Admitting: Occupational Therapy

## 2020-07-17 DIAGNOSIS — G8114 Spastic hemiplegia affecting left nondominant side: Secondary | ICD-10-CM

## 2020-07-17 NOTE — Progress Notes (Signed)
Physical Therapy Session Note  Patient Details  Name: Tyler Richards MRN: [id number] Date of Birth: [birth year]  Today's Date: 07/17/2020 PT Individual Time: 1030-1100 PT Individual Time Calculation (min): 30 min   Short Term Goals: Week 1:  PT Short Term Goal 1 (Week 1): Pt will consistently perform supine<>sit with mod assist PT Short Term Goal 2 (Week 1): Pt will perform sit<>stand with mod assist PT Short Term Goal 3 (Week 1): Pt will perform bed<>chair transfers with mod assist of 1 PT Short Term Goal 4 (Week 1): Pt will ambulate at least 77ft using LRAD with +2 max assist  Skilled Therapeutic Interventions/Progress Updates:    Patient received supine in bed, agreeable to PT. Abdominal binder donned while supine, verbal cues needed for rolling. CGA for supine > sitting edge of bed. MinA squat pivot to wc leading L. PT propelling patient in wc to therapy gym for time management. He was able to complete sit to stand with ModA x2 and mirror for visual feedback. Vertical line drawn on mirror to facilitate midline orientation. Patient able to stand for ~4 mins prior to needing seated rest break. Increased L knee flexion in stance. Verbal cuing to shift weight onto R LE and maintain R shoulders/hips against target to facilitate upright posture. Patient able to hold this position for ~15-20s before resuming pushing to the R. Patient ambulating 55ft with MaxA x2. He remains with difficulty maintaining midline orientation while advancing LE. Patient returning to room in wc, seatbelt alarm on, call light within reach. Patient requesting to brush teeth, NT alerted to patients position at sink. Wife at bedside.   Therapy Documentation Precautions:  Precautions Precautions: Fall, Other (comment) Precaution Comments: left hemiparesis with left hemi inattention; monitor HR and BP, TEDs and abdominal binder when up Restrictions Weight Bearing Restrictions: No    Therapy/Group: Individual  Therapy  Karoline Caldwell, PT, DPT, CBIS 07/17/2020, 7:39 AM

## 2020-07-17 NOTE — Procedures (Addendum)
Botox Injection for spasticity using needle EMG guidance Lot #[id number], exp 04/2022 Dilution: 40 Units/ml Indication: Severe spasticity which interferes with ADL,mobility and/or  hygiene and is unresponsive to medication management and other conservative care Informed consent was obtained after describing risks and benefits of the procedure with the patient. This includes bleeding, bruising, infection, excessive weakness, or medication side effects. A REMS form is on file and signed. Needle: 25g 1.5in for LE, 25 g 1" for U Number of units per muscle Pectoralis120 Biceps40  Hamstrings200 All injections were done after obtaining appropriate EMG activity and after negative drawback for blood. The patient tolerated the procedure well. Post procedure instructions were given. A followup appointment was made.

## 2020-07-17 NOTE — Progress Notes (Signed)
Occupational Therapy Session Note  Patient Details  Name: Tyler Richards MRN: [id number] Date of Birth: 11/01/1950  Today's Date: 07/17/2020 OT Individual Time: 3338-3291 OT Individual Time Calculation (min): 58 min    Short Term Goals: Week 2:  OT Short Term Goal 1 (Week 2): Pt will donn a pullover shirt with no more than mod assist for two consecutive sessions. OT Short Term Goal 2 (Week 2): Pt will complete LB bathing wiht mod assist sit to stand for two consecutive session. OT Short Term Goal 3 (Week 2): Pt will complete toilet transfers with mod asisst squat pivot or stand pivot. OT Short Term Goal 4 (Week 2): Pt will complete toileting tasks sit to stand with mod assist.  Skilled Therapeutic Interventions/Progress Updates:    Pt in bed to start session with therapist donning TEDs prior to transfer to the EOB.  He was able to complete supine to sit EOB with min assist and maintain sitting balance with supervision.  Worked on Probation officer and LB clothing from the EOB.  Mod demonstrational cueing with mod assist for hemidressing techniques.  He was then able to donn his pants with mod assist and increased time with mod assist for standing to pull them up over his hips.  Min assist for donning shoes with total assist for tying them.  He was able to then transfer squat pivot to the wheelchair with mod assist to the left.  Took pt to the therapy gym for work on standing balance and for dynamic balance activities.  He was able to stand with mod assist with mirror for feedback.  Had him ambulated with total assist +2 (pt 40%) for distances of 15 and approximately 30'.  Returned to the bed at the end of the session with the call button and phone in reach in preparation for Botox injections.  Pt required max assist to transfer stand pivot back to the bed with min assist to transition to supine from sitting.    Therapy Documentation Precautions:  Precautions Precautions: Fall, Other  (comment) Precaution Comments: left hemiparesis with left hemi inattention; monitor HR and BP, TEDs and abdominal binder when up Restrictions Weight Bearing Restrictions: No  Pain: Pain Assessment Pain Scale: Faces Pain Score: 0-No pain ADL: See Care Tool Section for some details of mobility and selfcare  Therapy/Group: Individual Therapy  Jamey Demchak OTR/L 07/17/2020, 12:15 PM

## 2020-07-17 NOTE — Progress Notes (Signed)
Physical Therapy Session Note  Patient Details  Name: Tyler Richards MRN: [id number] Date of Birth: [birth year]  Today's Date: 07/17/2020 PT Individual Time: 0174-9449 PT Individual Time Calculation (min): 38 min   Short Term Goals: Week 2:  PT Short Term Goal 1 (Week 2): Patient will transfer bed<> wc with MinA consistently PT Short Term Goal 2 (Week 2): Patient will ambulate >77ft with ModA (wc follow if needed) and LRAD PT Short Term Goal 3 (Week 2): Patient will maintain static standing balance with midline oriented for >1 min with MinA  Skilled Therapeutic Interventions/Progress Updates:  Pt received supine in bed with NT present completing peri-care. Pt agreeable to therapy session and pt's wife exiting. Supine>sitting R EOB, HOB flat but using bedrail, with min assist for trunk upright and pt relying significantly in R UE support. Sitting EOB using R UE support as needed and supervision for sitting balance while therapist donned shoes max assist for time management. R squat pivot EOB>w/c with mod assist for lifting/pivoting hips and pt demoing increased pusher tendencies with more impaired motor planning and more impaired trunk/pelvis disassociation requiring increased assist to pivot hips towards chair.  Transported to/from gym in w/c for time management and energy conservation. 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 Repeated sit<>stands to/from EOM having pt keep arms across his chest (to prevent pushing with R UE) and using mirror feedback with external target of 2nd person on his R during the transition with cues to maintain shoulder and hips in contact with that person to promote R weight shift and decrease pushing - therapist providing mod assist on L side and promoting increased hip/knee extension to  come to stand - x12 reps. Pt continues to demo L knee slightly flexed in stance and when therapist facilitates terminal knee extension pt reports increased pain - pt continues to have swelling of that joint due to gout. Attempted L LE NMR targeting stance phase via R LE foot taps on/off 6" step (no UE support) but after 1 rep pt reported increased L knee pain and unable to tolerate further reps. Transitioned to L LE NMR focusing on swing phase along with promotion of R weight shifting to decrease pusher tendencies via repeated L foot taps on/off 6" step without UE support - heavy mod assist for balance due to L lean after each rep - pt able to place L foot on/off step without assist but requiring additional attempts  - between each foot tap therapist had to provide max cuing for pt awareness of strong L lean with continued mirror feedback and external target to weight shift and place shoulder/hip in line with 2nd person on his R. Gait training ~5ft with +2 mod assist via 3 Musketeer support (3rd person for w/c follow) - pt able to advance L LE during swing but demos increased hip adduction requiring facilitation for wider BOS - pt demos improving forward pelvic advancement over L LE in stance though continues to require mod facilitation to improve this along with facilitation for increased hip/knee extensor activation. Pt transported back to room and pt reporting he was incontinent of urine during therapy session therefore assisted with transfer back to bed for NT to assist with peri-care. R squat pivot as described above. Sit>supine with min assist for LE management into bed - left in the care of NT.  Therapy Documentation Precautions:  Precautions Precautions: Fall, Other (comment) Precaution Comments: left hemiparesis with left hemi inattention; monitor HR and BP, TEDs and abdominal binder when up Restrictions Weight Bearing Restrictions: No  Pain: Reports L knee pain with terminal knee extension in  weightbearing - limited this during session for pain management.    Therapy/Group: Individual Therapy  Tawana Scale , PT, DPT, CSRS  07/17/2020, 12:44 PM

## 2020-07-17 NOTE — Progress Notes (Signed)
Physical Therapy Weekly Progress Note  Patient Details  Name: Tyler Richards MRN: [id number] Date of Birth: 1950-12-10  Beginning of progress report period: July 10, 2020 End of progress report period: July 17, 2020   Patient has met 4 of 4 short term goals.  Patient is making slow, but steady progress toward his LTGs. 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 He is able to complete bed mobility with supervision/CGA, transfers with MinA for squat pivot. He demonstrates poor midline orientation in standing, which carries over to greater amount of assist needed for gait.   Patient continues to demonstrate the following deficits muscle weakness, decreased cardiorespiratoy endurance, impaired timing and sequencing, abnormal tone, unbalanced muscle activation, motor apraxia, decreased coordination and decreased motor planning, decreased visual motor skills, decreased midline orientation, decreased attention to left, left side neglect and decreased motor planning, decreased initiation, decreased attention, decreased awareness, decreased problem solving, decreased safety awareness, decreased memory and delayed processing and decreased sitting balance, decreased standing balance, decreased postural control, hemiplegia and decreased balance strategies and therefore will continue to benefit from skilled PT intervention to increase functional independence with mobility.  Patient progressing toward long term goals..  Continue plan of care.  PT Short Term Goals Week 1:  PT Short Term Goal 1 (Week 1): Pt will consistently perform supine<>sit with mod assist PT Short Term Goal 1 - Progress (Week 1): Met PT Short Term Goal 2 (Week 1): Pt will perform sit<>stand with mod assist PT Short Term Goal 2 - Progress (Week 1): Met PT Short Term Goal 3 (Week 1): Pt will perform bed<>chair transfers with mod  assist of 1 PT Short Term Goal 3 - Progress (Week 1): Met PT Short Term Goal 4 (Week 1): Pt will ambulate at least 74ft using LRAD with +2 max assist PT Short Term Goal 4 - Progress (Week 1): Met Week 2:  PT Short Term Goal 1 (Week 2): Patient will transfer bed<> wc with MinA consistently PT Short Term Goal 2 (Week 2): Patient will ambulate >41ft with ModA (wc follow if needed) and LRAD PT Short Term Goal 3 (Week 2): Patient will maintain static standing balance with midline oriented for >1 min with MinA   Therapy Documentation Precautions:  Precautions Precautions: Fall, Other (comment) Precaution Comments: left hemiparesis with left hemi inattention; monitor HR and BP, TEDs and abdominal binder when up Restrictions Weight Bearing Restrictions: No   Therapy/Group: Individual Therapy  Debbora Dus 07/17/2020, 12:34 PM

## 2020-07-17 NOTE — Progress Notes (Signed)
[place] PHYSICAL MEDICINE & REHABILITATION PROGRESS NOTE   Subjective/Complaints: Pt /wife agreeable to botox injection  ROS- neg CP, SOB, N/V/D  Objective:   No results found. Recent Labs    07/15/20 0606 07/15/20 2036  WBC 5.5  --   HGB 10.2* 10.4*  HCT 31.2* 32.3*  PLT 606*  --    No results for input(s): NA, K, CL, CO2, GLUCOSE, BUN, CREATININE, CALCIUM in the last 72 hours.  Intake/Output Summary (Last 24 hours) at 07/17/2020 0729 Last data filed at 07/16/2020 1822 Gross per 24 hour  Intake 520 ml  Output --  Net 520 ml        Physical Exam: Vital Signs Blood pressure 106/67, pulse (!) 43, temperature 98.1 F (36.7 C), temperature source Oral, resp. rate 18, height 6' (1.829 m), weight 78.8 kg, SpO2 99 %.   General: No acute distress Mood and affect are appropriate Heart: Regular rate and rhythm no rubs murmurs or extra sounds Lungs: Clear to auscultation, breathing unlabored, no rales or wheezes Abdomen: Positive bowel sounds, soft nontender to palpation, nondistended Extremities: No clubbing, cyanosis, or edema Skin: No evidence of breakdown, no evidence of rash  Musculoskeletal: Full range of motion in all 4 extremities. No joint swelling    Neurologic: Cranial nerves II through XII intact, motor strength is 5/5 in right and trace left deltoid, bicep, tricep, grip, hip flexor, knee extensors, ankle dorsiflexor and plantar flexor Tone- increased knee flexor tone, MAS 3 but neg triple flexor with L foot stim Left pectoralis tone MAS 3 Musculoskeletal: Full range of motion in all 4 extremities. No joint swelling Psych: [name] affect is appropriate. Pt is cooperative   Assessment/Plan: 1. Functional deficits secondary to Right MCA infarct which require 3+ hours per day of interdisciplinary therapy in a comprehensive inpatient rehab setting.  Physiatrist is providing close team supervision and 24 hour management of active medical problems listed  below.  Physiatrist and rehab team continue to assess barriers to discharge/monitor patient progress toward functional and medical goals  Care Tool:  Bathing    Body parts bathed by patient: Left arm, Chest, Abdomen, Right upper leg, Left upper leg, Right lower leg, Face   Body parts bathed by helper: Left lower leg, Right arm, Front perineal area, Buttocks     Bathing assist Assist Level: Moderate Assistance - Patient 50 - 74%     Upper Body Dressing/Undressing Upper body dressing   What is the patient wearing?: Pull over shirt    Upper body assist Assist Level: Maximal Assistance - Patient 25 - 49%    Lower Body Dressing/Undressing Lower body dressing      What is the patient wearing?: Incontinence brief, Pants     Lower body assist Assist for lower body dressing: Maximal Assistance - Patient 25 - 49%     Toileting Toileting    Toileting assist Assist for toileting: Total Assistance - Patient < 25%     Transfers Chair/bed transfer  Transfers assist     Chair/bed transfer assist level: Moderate Assistance - Patient 50 - 74% (squat pivot)     Locomotion Ambulation   Ambulation assist   Ambulation activity did not occur: Safety/medical concerns (required skilled intervention to ambulate 43ft at hallway rail)  Assist level: 2 helpers Assistive device: Other (comment) (3 Musketeer) Max distance: 67ft   Walk 10 feet activity   Assist  Walk 10 feet activity did not occur: Safety/medical concerns  Assist level: 2 helpers Assistive device: Other (comment) (  3 Musketeer)   Walk 50 feet activity   Assist Walk 50 feet with 2 turns activity did not occur: Safety/medical concerns  Assist level: 2 helpers Assistive device: Other (comment) (3 Musketeer)    Walk 150 feet activity   Assist Walk 150 feet activity did not occur: Safety/medical concerns         Walk 10 feet on uneven surface  activity   Assist Walk 10 feet on uneven surfaces  activity did not occur: Safety/medical concerns         Wheelchair     Assist Will patient use wheelchair at discharge?: Yes Type of Wheelchair: Manual    Wheelchair assist level: Maximal Assistance - Patient 25 - 49% Max wheelchair distance: 50    Wheelchair 50 feet with 2 turns activity    Assist        Assist Level: Maximal Assistance - Patient 25 - 49%   Wheelchair 150 feet activity     Assist          Blood pressure 106/67, pulse (!) 43, temperature 98.1 F (36.7 C), temperature source Oral, resp. rate 18, height 6' (1.829 m), weight 78.8 kg, SpO2 99 %.    Medical Problem List and Plan: 1.  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  Antithrombotics: -DVT/anticoagulation: Venous Doppler studies negative.  SCDs- no heparin due to GI bleed             -antiplatelet therapy: Aspirin 81 mg daily, currently off of Brilinta given GIB and ICA has reoccluded 3. Pain Management: Tylenol as needed. Well controlled 4. Mood: Provide emotional support             -antipsychotic agents: N/A 5. Neuropsych: This patient is capable of making decisions on his own behalf. 6. Skin/Wound Care: Routine skin checks 7. Fluids/Electrolytes/Nutrition: Routine in and outs with follow-up chemistries. K+ 3.3 on 10/14- received 70meq Klor. - corrected to 4.0 on 10/15 8.  Constipation.   10/18: diarrhea s/p enema. Recommended 6-8 cups water per day to stay well hydrated 9.  Hyperlipidemia.  Lipitor 10.  Gout.  Colchicine twice daily.Hold due to diarrhea, most recently had flare up in foot and RIght elbow   Monitor for any gout flareups 11.  Orthostasis/bradycardia.  ProAmatine 5 mg every 8 hours as well as Florinef 0.1 mg daily.  Monitor with increased mobility.  Follow-up per  cardiology services, Order orthostatic vitals  10/18: consulted cardiology as brady to 42 with symptomatic orthostasis despite medication.  Vitals:   07/16/20 1949 07/17/20 0412  BP: (!) 101/54 106/67  Pulse: (!) 48 (!) 43  Resp: 19 18  Temp: 97.8 F (36.6 C) 98.1 F (36.7 C)  SpO2: 98% 99%  HR / BP in low to normal range, Florinef increased to BID by cardiology , discussed with Dr Chalmers Cater  12.  Enterobacter UTI.  Completing course of Bactrim. 13.  Rectal bleeding.  Follow-up GI services.  Currently no plan for endoscopic studies and monitor hemoglobin hematocrit.  Awaiting plan for possible colonoscopy - Hgb stable despite visible blood in stool  stable at 9.9 on 10/18. 10.2 on 10/20 14.  Spasticity Left hamstring and Left pectoralis which bothers  pt at noc- order baclofen, avoid tizanidine due to BP issues- may need Botox during inpt stay Leg spasms and anxiety will d/c baclofen trial klonopin at noc monitor for increased confusion  Botox today  15.  Loose stoolssome improvement after changing senna s to prn, will stop colchicine (pt took prn not daily at home - last BM 10/20 LOS: 8 days A FACE TO Altha E Zohaib Heeney 07/17/2020, 7:29 AM

## 2020-07-17 NOTE — Progress Notes (Signed)
Speech Language Pathology Weekly Progress and Session Note  Patient Details  Name: Tyler Richards MRN: [id number] Date of Birth: 1950-12-02  Beginning of progress report period: July 10, 2020 End of progress report period: July 17, 2020  Today's Date: 07/17/2020 SLP Individual Time: 1300-1400 SLP Individual Time Calculation (min): 60 min  Short Term Goals: Week 1: SLP Short Term Goal 1 (Week 1): Pt will demonstrate efficient mastication and oral clearance and minimal overt s/sx aspiration on current (recently upgraded) Dys 2 (minced/ground) texture diet with thin liquids and no more than Supervision A cues for use of compensatory swallow strategies. SLP Short Term Goal 1 - Progress (Week 1): Met SLP Short Term Goal 2 (Week 1): Pt will consume trials of Dys 3 (mech soft) solids X3 with efficient mastication and oral clearance and no more than Supervision A cues required for use of swallow straetgies prior to advancement. SLP Short Term Goal 2 - Progress (Week 1): Partly met SLP Short Term Goal 3 (Week 1): Pt will demonstrate ability to problem solve mildly complex to complex tasks with Min A verbal/visual cues. SLP Short Term Goal 3 - Progress (Week 1): Not met SLP Short Term Goal 4 (Week 1): Pt will selectively attend to tasks with Min A verbal cues for redirection. SLP Short Term Goal 4 - Progress (Week 1): Met SLP Short Term Goal 5 (Week 1): Pt will detect functional errors wtih Min A verbal/visual cues. SLP Short Term Goal 5 - Progress (Week 1): Not met SLP Short Term Goal 6 (Week 1): Pt will use speech intelligibility strategies to achieve 95% intelligibility in conversation with Min A verbal cues. SLP Short Term Goal 6 - Progress (Week 1): Met    New Short Term Goals: Week 2: SLP Short Term Goal 1 (Week 2): Patient will tolerate trial trays and snacks of Dys 3 solids textures with supervision A for oral clearance. SLP Short Term Goal 2 (Week 2): Patient will correct errors in  mildly complex problem solving and reasoning tasks with minA for attention. SLP Short Term Goal 3 (Week 2): Patient will demonstrate effective recall and use of strategies for improving overall speech production with supervisionA SLP Short Term Goal 4 (Week 2): Patient will be able to perform complex level problem solving task (medication management, money management) with min-modA for accuracy. SLP Short Term Goal 5 (Week 2): Patient will demonstrate anticipatory awareness related to his deficits during ADL's and discussion regarding guided discharge needs planning with modA  Weekly Progress Updates:  Patient met 3/6 STG's and is making progress towards goals he did not meet. He is performing mildly complex problem solving tasks with modA; tolerating Dys 2, thin liquids diet with supervision A. He requires cues for awareness to errors and for visual scanning/tracking during problem solving tasks. Speech intelligibility is above 90% at conversational level but patient continues with decreased articulatory accuracy related to left sided bilabial weakness and decreased ROM.    Intensity: Minumum of 1-2 x/day, 30 to 90 minutes Frequency: 3 to 5 out of 7 days Duration/Length of Stay: 11/10 Treatment/Interventions: Cognitive remediation/compensation;Cueing hierarchy;Dysphagia/aspiration precaution training;Functional tasks;Patient/family education;Internal/external aids;Speech/Language facilitation   Daily Session  Skilled Therapeutic Interventions: Patient participated in oral reading task at paragraph level and was able to perform without needing cues for visual left to right scanning. He answered multiple choice comprehension questions after reading with 100% accuracy and without cues. He completed mild complex level planning/scheduling task with modA for checking for errors but minimal A for  correcting errors. SLP used mirror to show patient his oral motor weakness to help increase his awareness.  Patient continues to benefit from skilled SLP intervention to maximize cognitive-linguistic, speech and swallow function prior to discharge.    General    Pain Pain Assessment Pain Scale: 0-10 Pain Score: 0-No pain  Therapy/Group: Individual Therapy  Sonia Baller, MA, CCC-SLP Speech Therapy

## 2020-07-18 ENCOUNTER — Inpatient Hospital Stay (HOSPITAL_COMMUNITY): Payer: Medicare Other | Admitting: Physical Therapy

## 2020-07-18 DIAGNOSIS — K625 Hemorrhage of anus and rectum: Secondary | ICD-10-CM

## 2020-07-18 DIAGNOSIS — R197 Diarrhea, unspecified: Secondary | ICD-10-CM

## 2020-07-18 DIAGNOSIS — I69391 Dysphagia following cerebral infarction: Secondary | ICD-10-CM

## 2020-07-18 DIAGNOSIS — R001 Bradycardia, unspecified: Secondary | ICD-10-CM

## 2020-07-18 MED ORDER — CALCIUM POLYCARBOPHIL 625 MG PO TABS
625.0000 mg | ORAL_TABLET | Freq: Every day | ORAL | Status: DC
  Administered 2020-07-18 – 2020-07-19 (×2): 625 mg via ORAL
  Filled 2020-07-18 (×2): qty 1

## 2020-07-18 NOTE — Progress Notes (Signed)
Pt remains incontinent of bowels/bladder. Having multiple bowel movements. Most recent BM had some blood present which MD is aware according to past notes and RN from day shift.  Bertram Millard LPN

## 2020-07-18 NOTE — Progress Notes (Signed)
Physical Therapy Session Note  Patient Details  Name: Tyler Richards MRN: [id number] Date of Birth: 01/22/1951  Today's Date: 07/18/2020 PT Individual Time: 1000-1100 PT Individual Time Calculation (min): 60 min   Short Term Goals: Week 1:  PT Short Term Goal 1 (Week 1): Pt will consistently perform supine<>sit with mod assist PT Short Term Goal 1 - Progress (Week 1): Met PT Short Term Goal 2 (Week 1): Pt will perform sit<>stand with mod assist PT Short Term Goal 2 - Progress (Week 1): Met PT Short Term Goal 3 (Week 1): Pt will perform bed<>chair transfers with mod assist of 1 PT Short Term Goal 3 - Progress (Week 1): Met PT Short Term Goal 4 (Week 1): Pt will ambulate at least 15ft using LRAD with +2 max assist PT Short Term Goal 4 - Progress (Week 1): Met  Skilled Therapeutic Interventions/Progress Updates:  Pt was seen bedside in the am. Pt rolled R/L with side rail and c/g to assist with hygiene. Pt dependent for hygiene. Donned Abd binder and TEDS hose.  Pt transferred supine to edge of bed with side rail and min A with verbal cues. Pt transferred from edge of bed to w/c with mod A and verbal cues. Treatment in gym focused on pre gait and weight shifting activities in parallel bars. Pt able to stand in parallel bars with min to mod A and verbal cues. Pt ambulated 8 feet with mod to max A and verbal cues. Attempted a PLS on L LE with + results. Following treatment pt returned to room and left sitting up in w/c with chair alarm on and wife at bedside.   Therapy Documentation Precautions:  Precautions Precautions: Fall, Other (comment) Precaution Comments: left hemiparesis with left hemi inattention; monitor HR and BP, TEDs and abdominal binder when up Restrictions Weight Bearing Restrictions: No General:   Pain: Pt c/o 6/10 low back pain.   Therapy/Group: Individual Therapy  Mitchell, James G 07/18/2020, 11:59 AM  

## 2020-07-18 NOTE — Progress Notes (Signed)
Educated patient and wife about timed toileting for urination. Attempted to toilet but unsuccessful at maintaining continence today. Was incontinent of bowel frequently, very small volumes, black. Passed on to night shift.

## 2020-07-18 NOTE — Progress Notes (Signed)
[place] PHYSICAL MEDICINE & REHABILITATION PROGRESS NOTE   Subjective/Complaints: Patient seen laying in bed this morning.  Wife at bedside.  He states he slept fairly overnight.  ROS: Denies CP, SOB, N/V  Objective:   No results found. Recent Labs    07/15/20 2036  HGB 10.4*  HCT 32.3*   No results for input(s): NA, K, CL, CO2, GLUCOSE, BUN, CREATININE, CALCIUM in the last 72 hours.  Intake/Output Summary (Last 24 hours) at 07/18/2020 1510 Last data filed at 07/18/2020 1300 Gross per 24 hour  Intake 720 ml  Output 1 ml  Net 719 ml        Physical Exam: Vital Signs Blood pressure 109/71, pulse (!) 45, temperature (!) 97.5 F (36.4 C), temperature source Oral, resp. rate 16, height 6' (1.829 m), weight 74.8 kg, SpO2 100 %. Constitutional: No distress . Vital signs reviewed. HENT: Normocephalic.  Atraumatic. Eyes: EOMI. No discharge. Cardiovascular: No JVD.  RRR. Respiratory: Normal effort.  No stridor.  Bilateral clear to auscultation. GI: Non-distended.  BS +. Skin: Warm and dry.  Intact. Psych: Normal mood.  Normal behavior. Musc: No edema in extremities.  No tenderness in extremities. Neuro: Alert Left facial weakness Motor: RUE/RLE: 5/5 proximal distal LUE 2-/5 proximal distal LLE: 3 -/5 proximal distal  Assessment/Plan: 1. Functional deficits secondary to Right MCA infarct which require 3+ hours per day of interdisciplinary therapy in a comprehensive inpatient rehab setting.  Physiatrist is providing close team supervision and 24 hour management of active medical problems listed below.  Physiatrist and rehab team continue to assess barriers to discharge/monitor patient progress toward functional and medical goals  Care Tool:  Bathing    Body parts bathed by patient: Left arm, Chest, Abdomen, Right upper leg, Left upper leg, Right lower leg, Face   Body parts bathed by helper: Left lower leg, Right arm, Front perineal area, Buttocks     Bathing  assist Assist Level: Moderate Assistance - Patient 50 - 74%     Upper Body Dressing/Undressing Upper body dressing   What is the patient wearing?: Pull over shirt    Upper body assist Assist Level: Moderate Assistance - Patient 50 - 74%    Lower Body Dressing/Undressing Lower body dressing      What is the patient wearing?: Incontinence brief, Pants     Lower body assist Assist for lower body dressing: Moderate Assistance - Patient 50 - 74%     Toileting Toileting    Toileting assist Assist for toileting: Total Assistance - Patient < 25%     Transfers Chair/bed transfer  Transfers assist     Chair/bed transfer assist level: Moderate Assistance - Patient 50 - 74%     Locomotion Ambulation   Ambulation assist   Ambulation activity did not occur: Safety/medical concerns (required skilled intervention to ambulate 60ft at hallway rail)  Assist level: Maximal Assistance - Patient 25 - 49% Assistive device: Parallel bars Max distance: 8   Walk 10 feet activity   Assist  Walk 10 feet activity did not occur: Safety/medical concerns  Assist level: 2 helpers Assistive device: Other (comment) (3 Musketeer)   Walk 50 feet activity   Assist Walk 50 feet with 2 turns activity did not occur: Safety/medical concerns  Assist level: 2 helpers Assistive device: Other (comment) (3 Musketeer)    Walk 150 feet activity   Assist Walk 150 feet activity did not occur: Safety/medical concerns         Walk 10 feet on uneven  surface  activity   Assist Walk 10 feet on uneven surfaces activity did not occur: Safety/medical concerns         Wheelchair     Assist Will patient use wheelchair at discharge?: Yes Type of Wheelchair: Manual    Wheelchair assist level: Maximal Assistance - Patient 25 - 49% Max wheelchair distance: 50    Wheelchair 50 feet with 2 turns activity    Assist        Assist Level: Maximal Assistance - Patient 25 - 49%    Wheelchair 150 feet activity     Assist          Blood pressure 109/71, pulse (!) 45, temperature (!) 97.5 F (36.4 C), temperature source Oral, resp. rate 16, height 6' (1.829 m), weight 74.8 kg, SpO2 100 %.    Medical Problem List and Plan: 1.  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  Continue CIR 2.  Antithrombotics: -DVT/anticoagulation: Venous Doppler studies negative.  SCDs.  No heparin given hemorrhage.             -antiplatelet therapy: Aspirin 81 mg daily, currently off of Brilinta given GIB and ICA has reoccluded 3. Pain Management: Tylenol as needed.   Controlled on 10/23 4. Mood: Provide emotional support             -antipsychotic agents: N/A 5. Neuropsych: This patient is?  Fully capable of making decisions on his own behalf. 6. Skin/Wound Care: Routine skin checks 7. Fluids/Electrolytes/Nutrition: Routine in and outs 8.  Constipation, no diarrhea.   Fiber added on 10/23 9.  Hyperlipidemia.  Lipitor 10.  Gout.  Colchicine twice daily. Hold due to diarrhea, most recently had flare up in foot and RIght elbow   Monitor for any gout flareups  Controlled on 10/23 11.  Orthostasis/bradycardia.  ProAmatine 5 mg every 8 hours as well as Florinef 0.1 mg daily.  Monitor with increased mobility.  Follow-up per cardiology services, Order orthostatic vitals  Vitals:   07/18/20 0253 07/18/20 1346  BP: (!) 105/59 109/71  Pulse: (!) 47 (!) 45  Resp: 15 16  Temp: 98.3 F (36.8 C) (!) 97.5 F (36.4 C)  SpO2: 99% 100%   Florinef increased to BID by cardiology   Orthostatics negative on 10/23, remains bradycardic 12.  Enterobacter UTI.  Completing course of Bactrim. 13.  Rectal bleeding.  Follow-up GI services.  Currently no plan for endoscopic studies and monitor hemoglobin hematocrit.  Awaiting plan for possible  colonoscopy   Hemoglobin 10.4 on 10/20, continue to monitor 14.  Spasticity Left hamstring and Left pectoralis which bothers pt at noc  Baclofen changed to Klonopin  Botox injected on 10/22 16.  Post stroke dysphagia  D2 thins, advance diet as tolerated  LOS: 9 days A FACE TO FACE EVALUATION WAS PERFORMED  Tanayah Squitieri Lorie Phenix 07/18/2020, 3:10 PM

## 2020-07-19 DIAGNOSIS — G8110 Spastic hemiplegia affecting unspecified side: Secondary | ICD-10-CM

## 2020-07-19 MED ORDER — CALCIUM POLYCARBOPHIL 625 MG PO TABS
1250.0000 mg | ORAL_TABLET | Freq: Every day | ORAL | Status: DC
  Filled 2020-07-19: qty 2

## 2020-07-19 NOTE — Progress Notes (Signed)
[place] PHYSICAL MEDICINE & REHABILITATION PROGRESS NOTE   Subjective/Complaints: Patient seen sitting up in bed this AM.  Wife at bedside.  He states he slept fairly overnight.  He states that he felt his nares were dry overnight, but denies any complaints this morning.  ROS: Denies CP, SOB, N/V  Objective:   No results found. No results for input(s): WBC, HGB, HCT, PLT in the last 72 hours. No results for input(s): NA, K, CL, CO2, GLUCOSE, BUN, CREATININE, CALCIUM in the last 72 hours.  Intake/Output Summary (Last 24 hours) at 07/19/2020 1330 Last data filed at 07/19/2020 0900 Gross per 24 hour  Intake 600 ml  Output --  Net 600 ml        Physical Exam: Vital Signs Blood pressure 108/60, pulse (!) 45, temperature 98.1 F (36.7 C), temperature source Oral, resp. rate 18, height 6' (1.829 m), weight 82.1 kg, SpO2 100 %. Constitutional: No distress . Vital signs reviewed. HENT: Normocephalic.  Atraumatic. Eyes: EOMI. No discharge. Cardiovascular: No JVD.  RRR. Respiratory: Normal effort.  No stridor.  Bilateral clear to auscultation. GI: Non-distended.  BS +. Skin: Warm and dry.  Intact. Psych: Normal mood.  Normal behavior. Musc: No edema in extremities.  No tenderness in extremities. Neuro: Alert Left facial weakness Motor: RUE/RLE: 5/5 proximal distal LUE 1+/5 proximal distal LLE: 3/5 proximal distal Increased tone noted in left upper extremity  Assessment/Plan: 1. Functional deficits secondary to Right MCA infarct which require 3+ hours per day of interdisciplinary therapy in a comprehensive inpatient rehab setting.  Physiatrist is providing close team supervision and 24 hour management of active medical problems listed below.  Physiatrist and rehab team continue to assess barriers to discharge/monitor patient progress toward functional and medical goals  Care Tool:  Bathing    Body parts bathed by patient: Left arm, Chest, Abdomen, Right upper leg, Left  upper leg, Right lower leg, Face   Body parts bathed by helper: Left lower leg, Right arm, Front perineal area, Buttocks     Bathing assist Assist Level: Moderate Assistance - Patient 50 - 74%     Upper Body Dressing/Undressing Upper body dressing   What is the patient wearing?: Pull over shirt    Upper body assist Assist Level: Moderate Assistance - Patient 50 - 74%    Lower Body Dressing/Undressing Lower body dressing      What is the patient wearing?: Incontinence brief, Pants     Lower body assist Assist for lower body dressing: Moderate Assistance - Patient 50 - 74%     Toileting Toileting    Toileting assist Assist for toileting: Total Assistance - Patient < 25%     Transfers Chair/bed transfer  Transfers assist     Chair/bed transfer assist level: Moderate Assistance - Patient 50 - 74%     Locomotion Ambulation   Ambulation assist   Ambulation activity did not occur: Safety/medical concerns (required skilled intervention to ambulate 51ft at hallway rail)  Assist level: Maximal Assistance - Patient 25 - 49% Assistive device: Parallel bars Max distance: 8   Walk 10 feet activity   Assist  Walk 10 feet activity did not occur: Safety/medical concerns  Assist level: 2 helpers Assistive device: Other (comment) (3 Musketeer)   Walk 50 feet activity   Assist Walk 50 feet with 2 turns activity did not occur: Safety/medical concerns  Assist level: 2 helpers Assistive device: Other (comment) (3 Musketeer)    Walk 150 feet activity   Assist Walk 150  feet activity did not occur: Safety/medical concerns         Walk 10 feet on uneven surface  activity   Assist Walk 10 feet on uneven surfaces activity did not occur: Safety/medical concerns         Wheelchair     Assist Will patient use wheelchair at discharge?: Yes Type of Wheelchair: Manual    Wheelchair assist level: Maximal Assistance - Patient 25 - 49% Max wheelchair  distance: 50    Wheelchair 50 feet with 2 turns activity    Assist        Assist Level: Maximal Assistance - Patient 25 - 49%   Wheelchair 150 feet activity     Assist          Medical Problem List and Plan: 1.  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  Continue CIR 2.  Antithrombotics: -DVT/anticoagulation: Venous Doppler studies negative.  SCDs.  No heparin given hemorrhage.             -antiplatelet therapy: Aspirin 81 mg daily, currently off of Brilinta given GIB and ICA has reoccluded 3. Pain Management: Tylenol as needed.   Controlled on 10/23 4. Mood: Provide emotional support             -antipsychotic agents: N/A 5. Neuropsych: This patient is?  Fully capable of making decisions on his own behalf. 6. Skin/Wound Care: Routine skin checks 7. Fluids/Electrolytes/Nutrition: Routine in and outs 8.  Constipation, now with diarrhea.   Fiber added on 10/23, increased on 10/24 9.  Hyperlipidemia.  Lipitor 10.  Gout.  Colchicine twice daily. Hold due to diarrhea, most recently had flare up in foot and RIght elbow   Monitor for any gout flareups  Controlled on 10/24 11.  Orthostasis/bradycardia.  ProAmatine 5 mg every 8 hours as well as Florinef 0.1 mg daily.  Monitor with increased mobility.  Follow-up per cardiology services, Order orthostatic vitals  Vitals:   07/18/20 1921 07/19/20 0259  BP: (!) 106/55 108/60  Pulse: (!) 48 (!) 45  Resp: 16 18  Temp: 98.3 F (36.8 C) 98.1 F (36.7 C)  SpO2: 97% 100%   Florinef increased to BID by cardiology   Orthostatics borderline negative on 10/24, remains bradycardic on 10/24 12.  Enterobacter UTI.  Completing course of Bactrim. 13.  Rectal bleeding.  Follow-up GI services.  Currently no plan for endoscopic studies and monitor hemoglobin hematocrit.  Awaiting plan  for possible colonoscopy   Hemoglobin 10.4 on 10/20, continue to monitor 14.  Spasticity Left hamstring and Left pectoralis which bothers pt at noc  Baclofen changed to Klonopin  Botox injected on 10/22, monitor for improvement in tone, however this may take weeks 16.  Post stroke dysphagia  D2 thins, advance diet as tolerated  LOS: 10 days A FACE TO FACE EVALUATION WAS PERFORMED  Ezio Wieck Lorie Phenix 07/19/2020, 1:30 PM

## 2020-07-19 NOTE — Progress Notes (Signed)
Pt is having trouble sleeping and complains of nasal congestion at times. I will pass along to day shift a suggestion for nasal spray. No other complaints at this time.  Bertram Millard LPN

## 2020-07-20 ENCOUNTER — Inpatient Hospital Stay (HOSPITAL_COMMUNITY): Payer: Medicare Other | Admitting: Occupational Therapy

## 2020-07-20 ENCOUNTER — Inpatient Hospital Stay (HOSPITAL_COMMUNITY): Payer: Medicare Other | Admitting: Speech Pathology

## 2020-07-20 ENCOUNTER — Inpatient Hospital Stay (HOSPITAL_COMMUNITY): Payer: Medicare Other | Admitting: Physical Therapy

## 2020-07-20 MED ORDER — LOPERAMIDE HCL 2 MG PO CAPS
4.0000 mg | ORAL_CAPSULE | Freq: Four times a day (QID) | ORAL | Status: DC | PRN
  Administered 2020-07-20 – 2020-07-28 (×15): 4 mg via ORAL
  Filled 2020-07-20 (×17): qty 2

## 2020-07-20 MED ORDER — SALINE SPRAY 0.65 % NA SOLN
1.0000 | NASAL | Status: DC | PRN
  Administered 2020-07-24: 1 via NASAL
  Filled 2020-07-20 (×2): qty 44

## 2020-07-20 MED ORDER — SACCHAROMYCES BOULARDII 250 MG PO CAPS
250.0000 mg | ORAL_CAPSULE | Freq: Two times a day (BID) | ORAL | Status: DC
  Administered 2020-07-20 – 2020-08-12 (×47): 250 mg via ORAL
  Filled 2020-07-20 (×47): qty 1

## 2020-07-20 NOTE — Progress Notes (Signed)
Occupational Therapy Session Note  Patient Details  Name: Tyler Richards MRN: [id number] Date of Birth: 07-Jul-1951  Today's Date: 07/20/2020 OT Individual Time: [id number] and [id number] OT Individual Time Calculation (min): 44 min and 74 min  Skilled Therapeutic Interventions/Progress Updates:    Pt greeted in bed, wife [name] at bedside. Per pt, he had just gotten cleaned up post incontinent BM, reported having persistent diarrhea this weekend and felt weak this morning. Agreeable to sit up to eat breakfast to work on his sitting balance while EOB. Once OT donned abdominal binder and Ted stockings, pt completed supine<sit with Mod A. Close supervision-CGA for sitting balance, note that pt would intermittently use his Rt hand as a unilateral support for balance but overall did well with maintaining neutral midline alignment. OT facilitated using the Lt hand to stabilize his bowl of grits, noted increased flexor patterning in digits so worked on keeping palm open at this time, OT also intermittently provided gentle stretching of digits in full extension. Vcs for locating meal items on the Lt side of his tray. Spouse initially just told pt where the meal items were but over time began cuing him like therapist to prompt him to independently scan. Pt did well with verbalizing aloud his swallowing precautions with questioning cues and exhibit carryover while eating. He then reported a spell of dizziness afterwards and requested to return to bed. BP: 111/72. Pt reported symptoms absolved after a few minutes of resting. He was incontinent of bowels. He rolled Rt>Lt with CGA for hygiene and brief change, which OT completed with Total A. Noted dark red stool, notified RN to come and see stool. Placed pt in sidelying position as he reports having discomfort near rectum due to frequent BMs. At end of session pt remained in bed with all needs within reach, bed alarm set, and spouse still at beside.   2nd Session 1:1  tx (74 min) Pt greeted in bed, already wearing compression garments, and premedicated for gout pain. Spouse Carolyn at bedside. Started session with caregiver education. Demonstrated and explained to [name] how to assist pt with ROM for the Lt UE using paper handout. She then exhibited carryover of education via hands on learning, requiring max fading to mod cuing given repetition. She would benefit from additional practice with OT. Reviewed therapeutic benefits of ROM including edema mgt, contracture prevention, and preservation of functional ranges. Afterwards OT retrieved a new seating system to increase pts OOB tolerance. He reports being unable to tolerate sitting up at this time due to pressure on tailbone. Supine<sit completed with Min A with HOB elevated. Vcs for hands in lap for upright neutral posture while OT set up transfer. Mod A for squat pivot<w/c going towards affected Lt side. Tilted pt back in the w/c and also taught [name] how to assist pt with changing tilt of chair, recommending changing tilt every 30 minutes for pressure relief. Pt reported increased comfort in this w/c and was agreeable to remain sitting up for at least 30 minutes. Educated both pt and [name] to call for nursing assistance to transfer back to bed. Left pt with all needs within reach, Lt UE elevated, and safety belt fastened. Pt denied dizziness during and at end of tx.     Therapy Documentation Precautions:  Precautions Precautions: Fall, Other (comment) Precaution Comments: left hemiparesis with left hemi inattention; monitor HR and BP, TEDs and abdominal binder when up Restrictions Weight Bearing Restrictions: No Pain: pt reports having gout pain, RN made aware  of his request for pain medicine during session, pt not yet due Pain Assessment Pain Scale: Faces Pain Score: 0-No pain Faces Pain Scale: No hurt ADL: ADL Eating: Supervision/safety Where Assessed-Eating: Bed level Grooming:  Supervision/safety Where Assessed-Grooming: Bed level Upper Body Bathing: Minimal assistance Where Assessed-Upper Body Bathing: Other (Comment) (3:1) Lower Body Bathing: Dependent Where Assessed-Lower Body Bathing: Edge of bed Upper Body Dressing: Maximal assistance Where Assessed-Upper Body Dressing: Edge of bed Lower Body Dressing: Dependent Where Assessed-Lower Body Dressing: Edge of bed Toileting: Dependent Where Assessed-Toileting: Bedside Commode Toilet Transfer: Dependent Toilet Transfer Method: Other (comment) Charlaine Dalton) Toilet Transfer Equipment: Radiographer, therapeutic: Not assessed Social research officer, government: Not assessed      Therapy/Group: Individual Therapy  Nicholos Aloisi A Krystn Dermody 07/20/2020, 12:15 PM

## 2020-07-20 NOTE — Progress Notes (Signed)
[place] PHYSICAL MEDICINE & REHABILITATION PROGRESS NOTE   Subjective/Complaints:  Frequent semi formed stools, discussed with pt , wife and nursing,  No abd pain   ROS: Denies CP, SOB, N/V  Objective:   No results found. No results for input(s): WBC, HGB, HCT, PLT in the last 72 hours. No results for input(s): NA, K, CL, CO2, GLUCOSE, BUN, CREATININE, CALCIUM in the last 72 hours.  Intake/Output Summary (Last 24 hours) at 07/20/2020 0753 Last data filed at 07/20/2020 0545 Gross per 24 hour  Intake 720 ml  Output --  Net 720 ml        Physical Exam: Vital Signs Blood pressure 100/62, pulse (!) 58, temperature 97.9 F (36.6 C), temperature source Oral, resp. rate 18, height 6' (1.829 m), weight 78.6 kg, SpO2 98 %.  General: No acute distress Mood and affect are appropriate Heart: Regular rate and rhythm no rubs murmurs or extra sounds Lungs: Clear to auscultation, breathing unlabored, no rales or wheezes Abdomen: Positive bowel sounds, soft nontender to palpation, nondistended Extremities: No clubbing, cyanosis, or edema Skin: No evidence of breakdown, no evidence of rash Neurologic: Cranial nerves II through XII intact, motor strength is 5/5 in bilateral deltoid, bicep, tricep, grip, hip flexor, knee extensors, ankle dorsiflexor and plantar flexor  Motor: RUE/RLE: 5/5 proximal distal LUE 1+/5 proximal distal LLE: 3/5 proximal distal Increased tone noted in left upper extremity  Assessment/Plan: 1. Functional deficits secondary to Right MCA infarct which require 3+ hours per day of interdisciplinary therapy in a comprehensive inpatient rehab setting.  Physiatrist is providing close team supervision and 24 hour management of active medical problems listed below.  Physiatrist and rehab team continue to assess barriers to discharge/monitor patient progress toward functional and medical goals  Care Tool:  Bathing    Body parts bathed by patient: Left arm, Chest,  Abdomen, Right upper leg, Left upper leg, Right lower leg, Face   Body parts bathed by helper: Left lower leg, Right arm, Front perineal area, Buttocks     Bathing assist Assist Level: Moderate Assistance - Patient 50 - 74%     Upper Body Dressing/Undressing Upper body dressing   What is the patient wearing?: Pull over shirt    Upper body assist Assist Level: Moderate Assistance - Patient 50 - 74%    Lower Body Dressing/Undressing Lower body dressing      What is the patient wearing?: Incontinence brief, Pants     Lower body assist Assist for lower body dressing: Moderate Assistance - Patient 50 - 74%     Toileting Toileting    Toileting assist Assist for toileting: Total Assistance - Patient < 25%     Transfers Chair/bed transfer  Transfers assist     Chair/bed transfer assist level: Moderate Assistance - Patient 50 - 74%     Locomotion Ambulation   Ambulation assist   Ambulation activity did not occur: Safety/medical concerns (required skilled intervention to ambulate 75ft at hallway rail)  Assist level: Maximal Assistance - Patient 25 - 49% Assistive device: Parallel bars Max distance: 8   Walk 10 feet activity   Assist  Walk 10 feet activity did not occur: Safety/medical concerns  Assist level: 2 helpers Assistive device: Other (comment) (3 Musketeer)   Walk 50 feet activity   Assist Walk 50 feet with 2 turns activity did not occur: Safety/medical concerns  Assist level: 2 helpers Assistive device: Other (comment) (3 Musketeer)    Walk 150 feet activity   Assist Walk 150  feet activity did not occur: Safety/medical concerns         Walk 10 feet on uneven surface  activity   Assist Walk 10 feet on uneven surfaces activity did not occur: Safety/medical concerns         Wheelchair     Assist Will patient use wheelchair at discharge?: Yes Type of Wheelchair: Manual    Wheelchair assist level: Maximal Assistance - Patient  25 - 49% Max wheelchair distance: 50    Wheelchair 50 feet with 2 turns activity    Assist        Assist Level: Maximal Assistance - Patient 25 - 49%   Wheelchair 150 feet activity     Assist          Medical Problem List and Plan: 1.  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  Continue CIR  PT, OT, SLP  2.  Antithrombotics: -DVT/anticoagulation: Venous Doppler studies negative.  SCDs.  No heparin given hemorrhage.             -antiplatelet therapy: Aspirin 81 mg daily, currently off of Brilinta given GIB and ICA has reoccluded 3. Pain Management: Tylenol as needed.   Controlled on 10/23 4. Mood: Provide emotional support             -antipsychotic agents: N/A 5. Neuropsych: This patient is?  Fully capable of making decisions on his own behalf. 6. Skin/Wound Care: Routine skin checks 7. Fluids/Electrolytes/Nutrition: Routine in and outs 8.  Constipation, now with diarrhea.   DC fiber, add probiotic , increase immodium  9.  Hyperlipidemia.  Lipitor 10.  Gout.  Colchicine twice daily. Hold due to diarrhea, most recently had flare up in foot and RIght elbow   Monitor for any gout flareups  Controlled on 10/24 11.  Orthostasis/bradycardia.  ProAmatine 5 mg every 8 hours as well as Florinef 0.1 mg daily.  Monitor with increased mobility.  Follow-up per cardiology services, Order orthostatic vitals  Vitals:   07/19/20 1934 07/20/20 0411  BP: 113/76 100/62  Pulse: (!) 46 (!) 58  Resp: 16 18  Temp: 97.8 F (36.6 C) 97.9 F (36.6 C)  SpO2: 98% 98%   Florinef increased to BID by cardiology   Orthostatics borderline negative on 10/24, remains bradycardic on 10/24 12.  Enterobacter UTI.  Completing course of Bactrim. 13.  Rectal bleeding.  Follow-up GI services.  Currently no plan for endoscopic studies and monitor  hemoglobin hematocrit.  Awaiting plan for possible colonoscopy   Hemoglobin 10.4 on 10/20, continue to monitor repeat in am  14.  Spasticity Left hamstring and Left pectoralis which bothers pt at noc  Baclofen changed to Klonopin 0.25mg  qhs  Botox injected on 10/22, monitor for improvement in tone, however this may take weeks 16.  Post stroke dysphagia  D2 thins, advance diet as tolerated  LOS: 11 days A FACE TO FACE EVALUATION WAS PERFORMED  Charlett Blake 07/20/2020, 7:53 AM

## 2020-07-20 NOTE — Progress Notes (Signed)
Physical Therapy Session Note  Patient Details  Name: Tyler Richards MRN: [id number] Date of Birth: [birth year]  Today's Date: 07/20/2020 PT Individual Time: 1000-1045 PT Individual Time Calculation (min): 45 min   Short Term Goals: Week 2:  PT Short Term Goal 1 (Week 2): Patient will transfer bed<> wc with MinA consistently PT Short Term Goal 2 (Week 2): Patient will ambulate >22ft with ModA (wc follow if needed) and LRAD PT Short Term Goal 3 (Week 2): Patient will maintain static standing balance with midline oriented for >1 min with MinA  Skilled Therapeutic Interventions/Progress Updates:    Patient received sitting up in wc, agreeable to PT. Wife at bedside. He denies pain, but endorses fatigue and relates this to multiple episodes of loose stool over the weekend. PT propelling patient in wc to therapy gym for time management. ModA to don blue scrub top to have tape down the front of it in vertical line to look in mirror to assess for midline orientation. MiNA x2 sit to stand with moderate pushing with R LE. He was able to ambulate 49ft with MinA/ModA x2 HHA. Occasional pushing noted with no protective balance strategies present. He was able to self correct, looking in the mirror, to regain midline orientation at times. Patient able to voice that he felt as though he was going to have another bowel movement- requesting to return to the room. By the time patient returned to room, he stated that he had already gone to the bathroom in his brief. ModA x1 for stand pivot transfer to bed, supervision for bed mobility using bed rails. TotalA for peri-hygiene and lower body dressing. He did have episode of loose stool, with significant amounts of blood in it. RN and MD aware. Patient requesting to remain in bed after session. PT discussed with wife and patient importance of using call light to alert nursing staff to his need to use restroom. Patient and wife verbalized understanding and patient able to  demonstrate which button to press to alert nursing. Bed alarm on, call light within reach.   Therapy Documentation Precautions:  Precautions Precautions: Fall, Other (comment) Precaution Comments: left hemiparesis with left hemi inattention; monitor HR and BP, TEDs and abdominal binder when up Restrictions Weight Bearing Restrictions: No    Therapy/Group: Individual Therapy  Karoline Caldwell, PT, DPT, CBIS 07/20/2020, 7:38 AM

## 2020-07-20 NOTE — Progress Notes (Signed)
Speech Language Pathology Daily Session Note  Patient Details  Name: Tyler Richards MRN: [id number] Date of Birth: [birth year]  Today's Date: 07/20/2020 SLP Individual Time: 1100-1126 SLP Individual Time Calculation (min): 26 min  Short Term Goals: Week 2: SLP Short Term Goal 1 (Week 2): Patient will tolerate trial trays and snacks of Dys 3 solids textures with supervision A for oral clearance. SLP Short Term Goal 2 (Week 2): Patient will correct errors in mildly complex problem solving and reasoning tasks with minA for attention. SLP Short Term Goal 3 (Week 2): Patient will demonstrate effective recall and use of strategies for improving overall speech production with supervisionA SLP Short Term Goal 4 (Week 2): Patient will be able to perform complex level problem solving task (medication management, money management) with min-modA for accuracy. SLP Short Term Goal 5 (Week 2): Patient will demonstrate anticipatory awareness related to his deficits during ADL's and discussion regarding guided discharge needs planning with modA  Skilled Therapeutic Interventions: Pt was seen for skilled ST targeting dysphagia and speech goals. SLP facilitated session with upgraded trial of Dys 3 (mechanical soft) texture snack and thin H2O. One delayed cough noted during intake. His mastication was efficient and used strategies for oral clearance of solids with only Supervision A verbal cues. Although he and his wife described reports of experienced what sounded like a globus sensation during some meals, he did not experience that during intake today. SLP provided education regarding strategies to reduce that sensation, such as smaller meals and bites, frequent sips of liquid, adding moisture to meats with gravy/other confinements. Pt did report that this sensation primarily occurs with meats, which also supports SLP's theory that this is related to a globus sensation. Recommend continue current diet; pt is making good  progress toward advancement with additional trials of D3 with SLP. Pt also reports he feels his speech clarity has returned almost to baseline, and throughout session he was Mod I for use of strategies to increase intelligibility. He was 1005 intelligible throughout session. Pt left sitting in bed with alarm set and wife still present. Continue per current plan of care.          Pain Pain Assessment Pain Scale: Faces Faces Pain Scale: No hurt  Therapy/Group: Individual Therapy  Arbutus Leas 07/20/2020, 7:25 AM

## 2020-07-21 ENCOUNTER — Inpatient Hospital Stay (HOSPITAL_COMMUNITY): Payer: Medicare Other | Admitting: Occupational Therapy

## 2020-07-21 ENCOUNTER — Inpatient Hospital Stay (HOSPITAL_COMMUNITY): Payer: Medicare Other | Admitting: Speech Pathology

## 2020-07-21 ENCOUNTER — Inpatient Hospital Stay (HOSPITAL_COMMUNITY): Payer: Medicare Other | Admitting: Physical Therapy

## 2020-07-21 ENCOUNTER — Inpatient Hospital Stay (HOSPITAL_COMMUNITY): Payer: Medicare Other

## 2020-07-21 LAB — CBC
HCT: 35.1 % — ABNORMAL LOW (ref 39.0–52.0)
Hemoglobin: 11.4 g/dL — ABNORMAL LOW (ref 13.0–17.0)
MCH: 32.8 pg (ref 26.0–34.0)
MCHC: 32.5 g/dL (ref 30.0–36.0)
MCV: 100.9 fL — ABNORMAL HIGH (ref 80.0–100.0)
Platelets: 479 10*3/uL — ABNORMAL HIGH (ref 150–400)
RBC: 3.48 MIL/uL — ABNORMAL LOW (ref 4.22–5.81)
RDW: 14.4 % (ref 11.5–15.5)
WBC: 6.8 10*3/uL (ref 4.0–10.5)
nRBC: 0 % (ref 0.0–0.2)

## 2020-07-21 NOTE — Progress Notes (Signed)
Speech Language Pathology Daily Session Note  Patient Details  Name: Tyler Richards MRN: [id number] Date of Birth: 23-Apr-1951  Today's Date: 07/21/2020 SLP Individual Time: 7672-0947 SLP Individual Time Calculation (min): 40 min  Short Term Goals: Week 2: SLP Short Term Goal 1 (Week 2): Patient will tolerate trial trays and snacks of Dys 3 solids textures with supervision A for oral clearance. SLP Short Term Goal 2 (Week 2): Patient will correct errors in mildly complex problem solving and reasoning tasks with minA for attention. SLP Short Term Goal 3 (Week 2): Patient will demonstrate effective recall and use of strategies for improving overall speech production with supervisionA SLP Short Term Goal 4 (Week 2): Patient will be able to perform complex level problem solving task (medication management, money management) with min-modA for accuracy. SLP Short Term Goal 5 (Week 2): Patient will demonstrate anticipatory awareness related to his deficits during ADL's and discussion regarding guided discharge needs planning with modA  Skilled Therapeutic Interventions: Pt was seen for skilled ST targeting dysphagia and cognitive goals. Pt and his wife continued to report perception of Dys 2 meat and veggies getting "hung" and being "difficult to swallow" during meals. Therefore, SLP encouraged pt to consume more of his lunch tray with SLP present to assist. Pt used swallow strategies with Supervision A verbal cues for clearance of trace lingual residue. He denied any sensation of food getting stuck and endorsed that he has some anxiety surrounding food/drinks and getting choked. SLP discussed this with pt and ways to feel more comfortable and minimize aspiration risk - small bites, add condiments/moisture, upright positioning and sitting in chair whenever possible (as opposed to bed), etc. Pt in agreement. He also consumed upgraded trials of Dys 3 solids with milk. Mastication and oral clearance was  efficient again with Supervision A verbal cues for use of strategies. He exhibited 1 immediate cough after large sip of milk via straw. Recommend continue current diet and potentially a trial tray of Dys 3 meal at next available opportunity to assess readiness for advancement.  SLP further facilitated session with a calendar based visual logic task targeting error awareness and visual scanning. Overall Mod A required for left visual inattention/scanning, Supervision A for problem solving and awareness of errors within task. He selectively attended with overall Supervision A verbal cues for redirection. Pt left sitting in chair with alarm set and needs within reach, wife still present - education is ongoing. Continue per current plan of care.          Pain Pain Assessment Pain Scale: 0-10 Pain Score: 0-No pain  Therapy/Group: Individual Therapy  Arbutus Leas 07/21/2020, 3:01 PM

## 2020-07-21 NOTE — Progress Notes (Signed)
Occupational Therapy Session Note  Patient Details  Name: AVYN COATE MRN: [id number] Date of Birth: [birth year]  Today's Date: 07/21/2020 OT Individual Time: 6712-4580 OT Individual Time Calculation (min): 57 min    Short Term Goals: Week 2:  OT Short Term Goal 1 (Week 2): Pt will donn a pullover shirt with no more than mod assist for two consecutive sessions. OT Short Term Goal 2 (Week 2): Pt will complete LB bathing wiht mod assist sit to stand for two consecutive session. OT Short Term Goal 3 (Week 2): Pt will complete toilet transfers with mod asisst squat pivot or stand pivot. OT Short Term Goal 4 (Week 2): Pt will complete toileting tasks sit to stand with mod assist.   Skilled Therapeutic Interventions/Progress Updates:    Pt greeted at time of session sitting up in [place] chair, wife present, agreeable to OT session, no c/o pain. Pt wanting to wash up at sink level, set up at sink and pt doffed shirt with Min A/CGA, doffed pants in standing with assist for time management. Sit to stands at sink/counter level consistently with Mod A for power up, and in standing requiring Mod A for dynamic standing balance and weaning to CGA at times for static standing with RUE support. UB bathe Min A in seated position. Mod A for LB bathing with pt able to use RUE to wash buttocks and periarea in standing with Min/Mod A for standing balance, L lean present and cues to correct with mirror feedback. No LLE buckling throughout. Assist to bathe past knee level for BLEs. UB dress to don pullover shirt Mod d/t extended cues and problem solving to remember to use hemidressing technique, LB dress Mod as well to thread but pt able to don over hips in standing with assist for standing balance d/t L lean. Dynamic standing at sink level to reach various items on L side, cues for fully turning head to visually scan. Brought to gym via wheelchair for time management and performed [employer] in sitting, reaching with RUE  to cross midline and attend to both sides, L side taking 2x longer reaction speed. Pt transported back to room, set up in TIS with alarm on, call bell in reach, wife present. No c/o dizziness throughout.  Therapy Documentation Precautions:  Precautions Precautions: Fall, Other (comment) Precaution Comments: left hemiparesis with left hemi inattention; monitor HR and BP, TEDs and abdominal binder when up Restrictions Weight Bearing Restrictions: No    Therapy/Group: Individual Therapy  Viona Gilmore 07/21/2020, 3:55 PM

## 2020-07-21 NOTE — Progress Notes (Signed)
[place] PHYSICAL MEDICINE & REHABILITATION PROGRESS NOTE   Subjective/Complaints:  Only 2 BM yesterdy received 2 doses of immodium which ws increased to 4mg  yesterday   ROS: Denies CP, SOB, N/V  Objective:   No results found. No results for input(s): WBC, HGB, HCT, PLT in the last 72 hours. No results for input(s): NA, K, CL, CO2, GLUCOSE, BUN, CREATININE, CALCIUM in the last 72 hours.  Intake/Output Summary (Last 24 hours) at 07/21/2020 0744 Last data filed at 07/20/2020 1835 Gross per 24 hour  Intake 354 ml  Output --  Net 354 ml        Physical Exam: Vital Signs Blood pressure 100/60, pulse (!) 51, temperature 98.5 F (36.9 C), temperature source Oral, resp. rate 20, height 6' (1.829 m), weight 78 kg, SpO2 98 %.   General: No acute distress Mood and affect are appropriate Heart: Regular rate and rhythm no rubs murmurs or extra sounds Lungs: Clear to auscultation, breathing unlabored, no rales or wheezes Abdomen: Positive bowel sounds, soft nontender to palpation, nondistended Extremities: No clubbing, cyanosis, or edema Skin: No evidence of breakdown, no evidence of rash    Motor: RUE/RLE: 5/5 proximal distal LUE 1+/5 proximal distal LLE: 3/5 proximal distal Increased tone noted in left upper extremity  Assessment/Plan: 1. Functional deficits secondary to Right MCA infarct which require 3+ hours per day of interdisciplinary therapy in a comprehensive inpatient rehab setting.  Physiatrist is providing close team supervision and 24 hour management of active medical problems listed below.  Physiatrist and rehab team continue to assess barriers to discharge/monitor patient progress toward functional and medical goals  Care Tool:  Bathing    Body parts bathed by patient: Left arm, Chest, Abdomen, Right upper leg, Left upper leg, Right lower leg, Face   Body parts bathed by helper: Left lower leg, Right arm, Front perineal area, Buttocks     Bathing  assist Assist Level: Moderate Assistance - Patient 50 - 74%     Upper Body Dressing/Undressing Upper body dressing   What is the patient wearing?: Pull over shirt    Upper body assist Assist Level: Moderate Assistance - Patient 50 - 74%    Lower Body Dressing/Undressing Lower body dressing      What is the patient wearing?: Incontinence brief, Pants     Lower body assist Assist for lower body dressing: Moderate Assistance - Patient 50 - 74%     Toileting Toileting    Toileting assist Assist for toileting: Total Assistance - Patient < 25%     Transfers Chair/bed transfer  Transfers assist     Chair/bed transfer assist level: Moderate Assistance - Patient 50 - 74%     Locomotion Ambulation   Ambulation assist   Ambulation activity did not occur: Safety/medical concerns (required skilled intervention to ambulate 12ft at hallway rail)  Assist level: 2 helpers Assistive device: Hand held assist Max distance: 45   Walk 10 feet activity   Assist  Walk 10 feet activity did not occur: Safety/medical concerns  Assist level: 2 helpers Assistive device: Hand held assist   Walk 50 feet activity   Assist Walk 50 feet with 2 turns activity did not occur: Safety/medical concerns  Assist level: 2 helpers Assistive device: Other (comment) (3 Musketeer)    Walk 150 feet activity   Assist Walk 150 feet activity did not occur: Safety/medical concerns         Walk 10 feet on uneven surface  activity   Assist Walk 10  feet on uneven surfaces activity did not occur: Safety/medical concerns         Wheelchair     Assist Will patient use wheelchair at discharge?: Yes Type of Wheelchair: Manual    Wheelchair assist level: Maximal Assistance - Patient 25 - 49% Max wheelchair distance: 50    Wheelchair 50 feet with 2 turns activity    Assist        Assist Level: Maximal Assistance - Patient 25 - 49%   Wheelchair 150 feet activity      Assist          Medical Problem List and Plan: 1.  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  Continue CIR team conf in am  PT, OT, SLP  2.  Antithrombotics: -DVT/anticoagulation: Venous Doppler studies negative.  SCDs.  No heparin given hemorrhage.             -antiplatelet therapy: Aspirin 81 mg daily, currently off of Brilinta given GIB and ICA has reoccluded 3. Pain Management: Tylenol as needed.   Controlled on 10/23 4. Mood: Provide emotional support             -antipsychotic agents: N/A 5. Neuropsych: This patient is?  Fully capable of making decisions on his own behalf. 6. Skin/Wound Care: Routine skin checks 7. Fluids/Electrolytes/Nutrition: Routine in and outs 8.  Constipation, now with diarrhea.   DC fiber, add probiotic , increase immodium - improved  9.  Hyperlipidemia.  Lipitor 10.  Gout.  Colchicine twice daily. Hold due to diarrhea, most recently had flare up in foot and RIght elbow   Monitor for any gout flareups  Controlled on 10/24 11.  Orthostasis/bradycardia.  ProAmatine 5 mg every 8 hours as well as Florinef 0.1 mg daily.  Monitor with increased mobility.  Follow-up per cardiology services, Order orthostatic vitals  Vitals:   07/20/20 2012 07/21/20 0523  BP: (!) 116/48 100/60  Pulse: (!) 47 (!) 51  Resp: 16 20  Temp: 97.7 F (36.5 C) 98.5 F (36.9 C)  SpO2: 99% 98%   Overall improved but still with asympt sinus brady  12.  Enterobacter UTI.  Completing course of Bactrim. 13.  Rectal bleeding.  Follow-up GI services.  Currently no plan for endoscopic studies and monitor hemoglobin hematocrit.  Awaiting plan for possible colonoscopy   Hemoglobin 10.4 on 10/20, continue to monitor repeat in am  14.  Spasticity Left hamstring and Left pectoralis which bothers pt at noc  Baclofen changed to  Klonopin 0.25mg  qhs  Botox injected on 10/22,expect improvement later this week 16.  Post stroke dysphagia  D2 thins, advance diet as tolerated  LOS: 12 days A FACE TO FACE EVALUATION WAS PERFORMED  Charlett Blake 07/21/2020, 7:44 AM

## 2020-07-21 NOTE — Progress Notes (Signed)
Physical Therapy Session Note  Patient Details  Name: Tyler Richards MRN: [id number] Date of Birth: 1951/02/25  Today's Date: 07/21/2020 PT Individual Time: 1108-1204 PT Individual Time Calculation (min): 56 min   Short Term Goals: Week 2:  PT Short Term Goal 1 (Week 2): Patient will transfer bed<> wc with MinA consistently PT Short Term Goal 2 (Week 2): Patient will ambulate >16ft with ModA (wc follow if needed) and LRAD PT Short Term Goal 3 (Week 2): Patient will maintain static standing balance with midline oriented for >1 min with MinA  Skilled Therapeutic Interventions/Progress Updates:   Pt received supine in bed with his wife present - pt awakening from a nap and agreeable to therapy session. Donned B LE TED hose and shoes max assist for time management (did not don abdominal binder today with no reports of lightheadedness). Supine>sitting R EOB, HOB flat but relying a significant amount on R UE bedrail support, with close supervision. R stand pivot EOB>w/c with mod assist of 1 for balance due to L lean. Transported to/from gym in w/c for time management and energy conservation. Sit>stands w/c>HHA with mod assist due to L pusher - had pt cross arms over chest to decrease pushing with R UE and then had 2nd person stand on pt's R side as external target to promote R weight shift with cue for pt to come to standing "shoulder to shoulder and hip to hip" with that person with this decreasing pt's pushing. Gait training ~180ft, ~12ft, ~275ft, and ~143ft all with seated breaks between and +2 mod assist via 3 Musketeer support - pt able to advance L LE during swing requiring min assist for placement due to excessive hip adduction and internal rotation - able to maintain L stance control without any assist at knee though does maintain slight flexed position - pt demoing significant improvement of advancing/weightshifting over L LE during stance in order to step forward with R LE - therapist providing  min/mod cuing/facilitation for weight shifting. During 2nd walk pt was incontinent of BM. Standing at sink with B UE support and mirror feedback for midline orientation with min assist for balance while therapist performed total assist LB clothing management and peri-care. At end of session pt left seated tilted back in TIS w/c with needs in reach, seat belt alarm on, and L UE therapeutically positioned on pillows.  Therapy Documentation Precautions:  Precautions Precautions: Fall, Other (comment) Precaution Comments: left hemiparesis with left hemi inattention; monitor HR and BP, TEDs and abdominal binder when up Restrictions Weight Bearing Restrictions: No  Pain: No reports of pain throughout session.   Therapy/Group: Individual Therapy  Tawana Scale , PT, DPT, CSRS  07/21/2020, 8:06 AM

## 2020-07-21 NOTE — Progress Notes (Signed)
Patient ID: Tyler Richards, male   DOB: Jun 09, 1951, 69 y.o.   MRN: [id number] Met with the patient and his wife to review role of the nurse CM and discuss secondary stroke risks. Reviewed HTN, HLD  Albumin level and APT. Handouts on DASH diet, cooking with less salt, protein foods and ASA and your heart. Wife reports they have ID cause for GI bleed and patient reports he is feeling better. No other questions noted at present re medications or secondary prevention tips. Continue to follow along for questions or additional educational needs. Margarito Liner

## 2020-07-21 NOTE — Progress Notes (Signed)
Physical Therapy Session Note  Patient Details  Name: Tyler Richards MRN: [id number] Date of Birth: July 30, 1951  Today's Date: 07/21/2020 PT Individual Time: 7026-3785 PT Individual Time Calculation (min): 44 min   Short Term Goals: Week 2:  PT Short Term Goal 1 (Week 2): Patient will transfer bed<> wc with MinA consistently PT Short Term Goal 2 (Week 2): Patient will ambulate >62ft with ModA (wc follow if needed) and LRAD PT Short Term Goal 3 (Week 2): Patient will maintain static standing balance with midline oriented for >1 min with MinA  Skilled Therapeutic Interventions/Progress Updates:    Patient received supine in bed, agreeable to PT. He denies pain and reports that he has been sleeping better. Patient requiring ModA to don pants supine in bed. He was visibly frustrated with given task stating that his wife would help him do this at home. PT educating patient on importance of completing all tasks that he can with greatest independence for carryover to home use. Upon standing up, patient with episode of bowel incontinence requiring that patient return to bed for pericare. MaxA for peri-hygiene and lower body dressing. Patient able to transfer to wc via stand pivot with MinA and verbal cues for sequencing. PT propelling patient to therapy gym in wc for time management and energy conservation. Patient completing dynamic sitting task reaching outside BOS matching cards. Patient requiring Min verbal cuing to attend to L side of visual field to find matching cards. Patient returning to room in wc, seatbelt alarm on, call light within reach, wife at bedside.   Therapy Documentation Precautions:  Precautions Precautions: Fall, Other (comment) Precaution Comments: left hemiparesis with left hemi inattention; monitor HR and BP, TEDs and abdominal binder when up Restrictions Weight Bearing Restrictions: No    Therapy/Group: Individual Therapy  Karoline Caldwell, PT, DPT,  CBIS 07/21/2020, 7:36 AM

## 2020-07-22 ENCOUNTER — Inpatient Hospital Stay (HOSPITAL_COMMUNITY): Payer: Medicare Other

## 2020-07-22 ENCOUNTER — Inpatient Hospital Stay (HOSPITAL_COMMUNITY): Payer: Medicare Other | Admitting: Occupational Therapy

## 2020-07-22 LAB — URINALYSIS, ROUTINE W REFLEX MICROSCOPIC
Bilirubin Urine: NEGATIVE
Glucose, UA: NEGATIVE mg/dL
Hgb urine dipstick: NEGATIVE
Ketones, ur: NEGATIVE mg/dL
Leukocytes,Ua: NEGATIVE
Nitrite: NEGATIVE
Protein, ur: NEGATIVE mg/dL
Specific Gravity, Urine: 1.004 — ABNORMAL LOW (ref 1.005–1.030)
pH: 7 (ref 5.0–8.0)

## 2020-07-22 NOTE — Progress Notes (Signed)
Tyler Richards PHYSICAL MEDICINE & REHABILITATION PROGRESS NOTE   Subjective/Complaints: No issues overnte no BMs last noc , received immodium this am  Discussed labwork   ROS: Denies CP, SOB, N/V  Objective:   No results found. Recent Labs    07/21/20 0953  WBC 6.8  HGB 11.4*  HCT 35.1*  PLT 479*   No results for input(s): NA, K, CL, CO2, GLUCOSE, BUN, CREATININE, CALCIUM in the last 72 hours.  Intake/Output Summary (Last 24 hours) at 07/22/2020 0806 Last data filed at 07/21/2020 1822 Gross per 24 hour  Intake 310 ml  Output --  Net 310 ml        Physical Exam: Vital Signs Blood pressure (!) 99/49, pulse 63, temperature 98.3 F (36.8 C), temperature source Oral, resp. rate 20, height 6' (1.829 m), weight 76.7 kg, SpO2 97 %.  General: No acute distress Mood and affect are appropriate Heart: Regular rate and rhythm no rubs murmurs or extra sounds Lungs: Clear to auscultation, breathing unlabored, no rales or wheezes Abdomen: Positive bowel sounds, soft nontender to palpation, nondistended Extremities: No clubbing, cyanosis, or edema Skin: No evidence of breakdown, no evidence of rash Motor: RUE/RLE: 5/5 proximal distal LUE 1+/5 proximal distal LLE: 3/5 proximal distal Increased tone noted in left upper extremity  Assessment/Plan: 1. Functional deficits secondary to Right MCA infarct which require 3+ hours per day of interdisciplinary therapy in a comprehensive inpatient rehab setting.  Physiatrist is providing close team supervision and 24 hour management of active medical problems listed below.  Physiatrist and rehab team continue to assess barriers to discharge/monitor patient progress toward functional and medical goals  Care Tool:  Bathing    Body parts bathed by patient: Left arm, Chest, Abdomen, Right upper leg, Left upper leg, Right lower leg, Face   Body parts bathed by helper: Left lower leg, Right arm, Right lower leg     Bathing assist Assist  Level: Moderate Assistance - Patient 50 - 74% (Min for UB, Mod for LB)     Upper Body Dressing/Undressing Upper body dressing   What is the patient wearing?: Pull over shirt    Upper body assist Assist Level: Moderate Assistance - Patient 50 - 74%    Lower Body Dressing/Undressing Lower body dressing      What is the patient wearing?: Incontinence brief, Pants     Lower body assist Assist for lower body dressing: Moderate Assistance - Patient 50 - 74%     Toileting Toileting    Toileting assist Assist for toileting: Total Assistance - Patient < 25%     Transfers Chair/bed transfer  Transfers assist     Chair/bed transfer assist level: Moderate Assistance - Patient 50 - 74% (stand pivot)     Locomotion Ambulation   Ambulation assist   Ambulation activity did not occur: Safety/medical concerns (required skilled intervention to ambulate 5f at hallway rail)  Assist level: 2 helpers Assistive device: Other (comment) (3 Musketeer) Max distance: 2070f  Walk 10 feet activity   Assist  Walk 10 feet activity did not occur: Safety/medical concerns  Assist level: 2 helpers Assistive device: Other (comment) (3 Musketeer)   Walk 50 feet activity   Assist Walk 50 feet with 2 turns activity did not occur: Safety/medical concerns  Assist level: 2 helpers Assistive device: Other (comment) (3 Musketeer)    Walk 150 feet activity   Assist Walk 150 feet activity did not occur: Safety/medical concerns  Assist level: 2 helpers Assistive device: Other (comment) (  3 Musketeer)    Walk 10 feet on uneven surface  activity   Assist Walk 10 feet on uneven surfaces activity did not occur: Safety/medical concerns         Wheelchair     Assist Will patient use wheelchair at discharge?: Yes Type of Wheelchair: Manual    Wheelchair assist level: Maximal Assistance - Patient 25 - 49% Max wheelchair distance: 50    Wheelchair 50 feet with 2 turns  activity    Assist        Assist Level: Maximal Assistance - Patient 25 - 49%   Wheelchair 150 feet activity     Assist          Medical Problem List and Plan: 1.  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  Continue CIR Team conference today please see physician documentation under team conference tab, met with team  to discuss problems,progress, and goals. Formulized individual treatment plan based on medical history, underlying problem and comorbidities.  PT, OT, SLP  2.  Antithrombotics: -DVT/anticoagulation: Venous Doppler studies negative.  SCDs.  No heparin given hemorrhage.             -antiplatelet therapy: Aspirin 81 mg daily, currently off of Brilinta given GIB and ICA has reoccluded 3. Pain Management: Tylenol as needed.   Controlled on 10/23 4. Mood: Provide emotional support             -antipsychotic agents: N/A 5. Neuropsych: This patient is?  Fully capable of making decisions on his own behalf. 6. Skin/Wound Care: Routine skin checks 7. Fluids/Electrolytes/Nutrition: Routine in and outs 8.  Constipation, now with diarrhea.   DC fiber, add probiotic , increase immodium - improved  9.  Hyperlipidemia.  Lipitor 10.  Gout.  Colchicine twice daily. Hold due to diarrhea, most recently had flare up in foot and RIght elbow   Monitor for any gout flareups  Controlled on 10/24 11.  Orthostasis/bradycardia.  ProAmatine 5 mg every 8 hours as well as Florinef 0.1 mg daily.  Monitor with increased mobility.  Follow-up per cardiology services, Order orthostatic vitals  Vitals:   07/21/20 1914 07/22/20 0304  BP: 104/70 (!) 99/49  Pulse: 63 63  Resp: 16 20  Temp: 98.7 F (37.1 C) 98.3 F (36.8 C)  SpO2: 99% 97%   Overall improved but still with asympt sinus brady HR now in 60s 12.  Enterobacter UTI.   Completing course of Bactrim. 13.  Rectal bleeding.  Follow-up GI services.  Currently no plan for endoscopic studies and monitor hemoglobin hematocrit.  Awaiting plan for possible colonoscopy   Hemoglobin 10.4 on 10/20, continue to monitor repeat in am  14.  Spasticity Left hamstring and Left pectoralis which bothers pt at noc  Baclofen changed to Klonopin 0.78m qhs  Botox injected on 10/22,expect improvement later this week 16.  Post stroke dysphagia  D2 thins, advance diet as tolerated  LOS: 13 days A FACE TO FACE EVALUATION WAS PERFORMED  ACharlett Blake10/27/2021, 8:06 AM

## 2020-07-22 NOTE — Progress Notes (Signed)
Physical Therapy Session Note  Patient Details  Name: Tyler Richards MRN: [id number] Date of Birth: 05-23-1951  Today's Date: 07/22/2020 PT Individual Time: 0800-0912 PT Individual Time Calculation (min): 72 min   Short Term Goals: Week 2:  PT Short Term Goal 1 (Week 2): Patient will transfer bed<> wc with MinA consistently PT Short Term Goal 2 (Week 2): Patient will ambulate >24ft with ModA (wc follow if needed) and LRAD PT Short Term Goal 3 (Week 2): Patient will maintain static standing balance with midline oriented for >1 min with MinA  Skilled Therapeutic Interventions/Progress Updates:    Patient received supine in bed, agreeable to PT. He denies pain. PT donning TED hose with patient supine in bed. He was able to come to sitting edge of bed with supervision. ModA to done pants in sitting. MaxA for shoes. Patient able to stand with ModA to pull pants up remainder of the way. [name] stand pivot to chair, R leading. PT propelling patient in TIS wc to therapy gym. He was able to maintain static standing balance with mirror for visual feedback with light CGA. He was able to correct lateral leaning without verbal cuing to regain midline. Patient then standing to complete card matching task with CGA. No verbal cues needed to attend to L side of board, which is an improvement. L knee maintains slight flexion in stance, despite verbal and tactile cuing for full extension. This may be related to gout or increased hamstring tone. Patient then ambulating the following distances on treadmill with LiteGait with minimal bodyweight support: 2'57" 0.67mph 116ft, 1'52" 0.14mph for 61ft, 1'30"o.73mph for 44ft. Assist needed to facilitate lateral weight shifts onto stance leg. Intermittent ModA needed for full extension of L knee in stance and L toe clearance through swing phase. Patient demonstrating poor endurance requiring extended seated rest breaks between bouts. Patient returning to room in wc, seatbelt alarm on,  call light within reach, wife at bedside.   Therapy Documentation Precautions:  Precautions Precautions: Fall, Other (comment) Precaution Comments: left hemiparesis with left hemi inattention; monitor HR and BP, TEDs and abdominal binder when up Restrictions Weight Bearing Restrictions: No    Therapy/Group: Individual Therapy  Karoline Caldwell, PT, DPT, CBIS 07/22/2020, 7:40 AM

## 2020-07-22 NOTE — Progress Notes (Signed)
Speech Language Pathology Daily Session Note  Patient Details  Name: Tyler Richards MRN: [id number] Date of Birth: [birth year]  Today's Date: 07/23/2020 SLP Individual Time: 1007-1219 SLP Individual Time Calculation (min): 42 min  Short Term Goals: Week 2: SLP Short Term Goal 1 (Week 2): Patient will tolerate trial trays and snacks of Dys 3 solids textures with supervision A for oral clearance. SLP Short Term Goal 2 (Week 2): Patient will correct errors in mildly complex problem solving and reasoning tasks with minA for attention. SLP Short Term Goal 3 (Week 2): Patient will demonstrate effective recall and use of strategies for improving overall speech production with supervisionA SLP Short Term Goal 4 (Week 2): Patient will be able to perform complex level problem solving task (medication management, money management) with min-modA for accuracy. SLP Short Term Goal 5 (Week 2): Patient will demonstrate anticipatory awareness related to his deficits during ADL's and discussion regarding guided discharge needs planning with modA  Skilled Therapeutic Interventions: Pt was seen for skilled ST targeting cognitive goals. SLP facilitated session with repeating a complex medication management task in attempt to increase pt's independence and accuracy. Pt continued to required Mod A verbal and visual cues for error awareness, Min A for problem solving and visual scanning from left to right. Overall Mod A also required for recall of previously discussed strategies to increase accuracy, decreased to Min A for recall within the task. He selectively attended to task with Min A verbal cues for redirection. Pt expressed interest in continuing to work toward greater accuracy with this task, as it is something functional that he was independent with prior to admission. Discussed that we will continue to target, however he would likely still require Supervision with this task at discharge regardless - pt in agreement.  Pt left laying in bed with alarm set and needs within reach.        Pain Pain Assessment Pain Scale: 0-10 Pain Score: 0-No pain  Therapy/Group: Individual Therapy  Arbutus Leas 07/23/2020, 10:57 AM

## 2020-07-22 NOTE — Progress Notes (Signed)
Occupational Therapy Session Note  Patient Details  Name: Tyler Richards MRN: [id number] Date of Birth: 12-09-1950  Today's Date: 07/22/2020 OT Individual Time: 1405-1503 OT Individual Time Calculation (min): 58 min    Short Term Goals: Week 2:  OT Short Term Goal 1 (Week 2): Pt will donn a pullover shirt with no more than mod assist for two consecutive sessions. OT Short Term Goal 2 (Week 2): Pt will complete LB bathing wiht mod assist sit to stand for two consecutive session. OT Short Term Goal 3 (Week 2): Pt will complete toilet transfers with mod asisst squat pivot or stand pivot. OT Short Term Goal 4 (Week 2): Pt will complete toileting tasks sit to stand with mod assist.  Skilled Therapeutic Interventions/Progress Updates:    Pt in bed to start session with soiled brief.  Max assist needed for cleaning up bloody stool and for donning new brief, rolling side to side.  Once this was completed, he transferred to sitting EOB with min assist and then worked on donning his shoes with overall mod assist.  Min instructional cueing for correct technique and for donning the correct shoe on the right foot to start.  He then transferred scoot pivot to the wheelchair and was taken down to the therapy gym.  He transferred to the therapy mat with mod assist stand pivot and worked on Research scientist (life sciences).  Therapist completed stretching of the elbow flexors as well as the shoulder flexors and wrist/ digit extensors.  Then had him working on positioning the UEs out beside of him in sitting and working on lateral weightshifts side to side as well as scapular adduction.  He then progressed to working on active movements of shoulder external rotation with use of a therapy ball as well as use of the tilted stool for shoulder flexion.  Increased flexor tone in the elbow as well as shoulder adductors.  Finished session with return to the room and pt requesting to stay up in the wheelchair.  Call button and  phone in reach with safety belt in place.  [name] spouse in the room as well.      Therapy Documentation Precautions:  Precautions Precautions: Fall, Other (comment) Precaution Comments: left hemiparesis with left hemi inattention; monitor HR and BP, TEDs and abdominal binder when up Restrictions Weight Bearing Restrictions: No  Pain: Pain Assessment Pain Scale: Faces Pain Score: 0-No pain ADL: See Care Tool Section for some details of mobility and selfcare  Therapy/Group: Individual Therapy  Yousuf Ager OTR/L 07/22/2020, 4:13 PM

## 2020-07-22 NOTE — Progress Notes (Signed)
Patient ID: Tyler Richards, male   DOB: 1951/01/02, 69 y.o.   MRN: [id number] Team Conference Report to Patient/Family  Team Conference discussion was reviewed with the patient and caregiver, including goals, any changes in plan of care and target discharge date.  Patient and caregiver express understanding and are in agreement.  The patient has a target discharge date of 08/05/20.  [name] 07/22/2020, 1:02 PM

## 2020-07-22 NOTE — Patient Care Conference (Signed)
Inpatient RehabilitationTeam Conference and Plan of Care Update Date: 07/22/2020   Time: 10:47 AM    Patient Name: Tyler Richards      Medical Record Number: [id number]  Date of Birth: 1951/04/14 Sex: Male         Room/Bed: 4M02C/4M02C-01 Payor Info: Payor: MEDICARE / Plan: MEDICARE PART A AND B / Product Type: *No Product type* /    Admit Date/Time:  07/09/2020  3:40 PM  Primary Diagnosis:  Right middle cerebral artery stroke [facility])  Hospital Problems: Principal Problem:   Right middle cerebral artery stroke [facility]) Active Problems:   Dysphagia, post-stroke   Rectal bleeding   Diarrhea   Spastic hemiparesis [facility])    Expected Discharge Date: Expected Discharge Date: 08/05/20  Team Members Present: Physician leading conference: Dr. Alysia Penna Care Coodinator Present: Dorien Chihuahua, RN, BSN, CRRN;Christina Hamer, [place] Nurse Present: Rayne Du, LPN PT Present: Estevan Ryder, PT OT Present: Clyda Greener, OT SLP Present: Jettie Booze, CF-SLP PPS Coordinator present : Ileana Ladd, PT     Current Status/Progress Goal Weekly Team Focus  Bowel/Bladder   Pt incontinent of B/B. LBM 07/21/2020. Four stools in 24 hours.  Less frequent stools  Assess B/B every shift and PRN   Swallow/Nutrition/ Hydration   Dys 2 textures, working on Dys 3 trials, Supervision A strategies oral clearance, intermittent supervision  Mod I  carryover swallow strategies, upgrading to dys 3 textures, compensating for reports of globus sensation   ADL's   Min UB bathing, Mod UB dress, Mod LB bathing and dressing with sit to stands at sink level with pt able to use RUE to wash buttocks/periarea and stand to help pull pants over hips with Mod A for standing balance/L lean. Continues to have L neglect but able to correct with cues. Continues to have incontinence and limited functional use in LUE. Recently got Botox in L bicep?  min assist  sit to stands, standing balance/tolerance, transfer training,  balance retraining, NMR, L attention, ADL retraining   Mobility   CGA/supervision bed mobility, mod assist sit<>stands due to pushing L during transition, mod assist squat pivot transfers, gait up to 261ft with +2 mod assist via 3 [place] support, continues with L inattention  min assist overall at ambulatory level  L attention, transfer training, dynamic sitting balance, dynamic standing balance, gait training, pt/family education, activity tolerance, L LE NMR   Communication   95-100% intelligible, Mod I  Mod I  carryover speech strategies, goal met   Safety/Cognition/ Behavioral Observations  Supervision-Min basic, Min-Mod more complex and recall  Supervision-Mod I  complex problem solving, error awareness, selective attention, recall   Pain   Rating pain <5. Pain managed well with PRN Tylenol  pain rating consistently <3  Assess pain every shift and PRN   Skin   MASD to buttocks improving  No new skin breakdown  Assess skin every shift and PRN     Discharge Planning:  Discharging home with spouse. Has grab bars, wheelchair, rolling walker and cane. Mobile home, 2 steps to enter, R side railings   Team Discussion: Patient with issues with bowels; medications adjusted and MD added probiotic. Discontinue ensure and increase imodium administration. Progress impaired by bowel incontinence, poor endurance, perseveration and tone. Timed toileting reinforced and UA to check due to urinary frequency and urgency.  Patient on target to meet rehab goals: no, possible downgrading of the goals before discharge  *See Care Plan and progress notes for long and short-term goals.  Revisions to Treatment Plan:  Hand over hand for tasks, Work on squat pivot transfers SLP addressing awareness, problem solving and wife reports that patient notes food gets stuck. Light gait training Teaching Needs: Transfers, toileting, medications, etc.   Current Barriers to Discharge: Decreased caregiver support  and home access (2 step entry with right handrail)  Possible Resolutions to Barriers:  Family education     Medical Summary Current Status: Hgb stable , occ hematochezia, BPs soft, with bradycardia,  diarrhea improving  Barriers to Discharge: Medical stability   Possible Resolutions to Barriers/Weekly Focus: mdication adjustment of bowel meds, monitor for gout flare, may need to resume colchicine   Continued Need for Acute Rehabilitation Level of Care: The patient requires daily medical management by a physician with specialized training in physical medicine and rehabilitation for the following reasons: Direction of a multidisciplinary physical rehabilitation program to maximize functional independence : Yes Medical management of patient stability for increased activity during participation in an intensive rehabilitation regime.: Yes Analysis of laboratory values and/or radiology reports with any subsequent need for medication adjustment and/or medical intervention. : Yes   I attest that I was present, lead the team conference, and concur with the assessment and plan of the team.   Dorien Chihuahua B 07/22/2020, 4:01 PM

## 2020-07-22 NOTE — Progress Notes (Signed)
Occupational Therapy Session Note  Patient Details  Name: Tyler Richards MRN: [id number] Date of Birth: 02-17-1951  Today's Date: 07/22/2020 OT Individual Time: 0950-1100 OT Individual Time Calculation (min): 70 min    Short Term Goals: Week 1:  OT Short Term Goal 1 (Week 1): Pt will maintain static sitting balance EOB in perparation for selfcare tasks with supervision for at least three mins. OT Short Term Goal 1 - Progress (Week 1): Met OT Short Term Goal 2 (Week 1): Pt will complete LB bathing sit to stand with max assist for two consecutive sessions. OT Short Term Goal 2 - Progress (Week 1): Met OT Short Term Goal 3 (Week 1): Pt will complete toilet transfer squat pivot with max assist . OT Short Term Goal 3 - Progress (Week 1): Met OT Short Term Goal 4 (Week 1): Pt will donn a pullover shirt with no more than mod assist for two consecutive sessions. OT Short Term Goal 4 - Progress (Week 1): Not met  Skilled Therapeutic Interventions/Progress Updates:     Pt received in TIS with 6 out of 10 pain in Buttocks. Repositioning provided for pain relief  ADL: Pt completes UB dressing with VC for hemi sequencing and S to doff shirt with VC for L attention and MIN A to thread LUE into shirt to don new one.   Neuromuscular Reeducation Pt completes seated/sidleying and supine NMR for scap/shoulder mobilization below 90* shoulder flexion for all movements. Edu re reducing risk of shoulder sublux/impingement as well stretching in prep for functional activities. Pt completes seated reaching activities with OT WB LUE into OT thigh and pt reaching in PNF diagonals and posteriorly with VC for head turn R to improve pec/neck strengthening.  Pt WB into partial stance for reaching dynamically reaching behind and to L anteriorly for deep WB into hemi side as well as ant pect stretch on L in seated.   Therapeutic exercise OT provides PROM to levator scap, upper/lower trap, pec and, bi/triceps,& ext  rotators in prep for functional activity, increased muscle lengthening, reducing spasticity and decreasing risk of contracture with flexor synergy pattern.   Pt left at end of session in TIS with exit alarm on, call light in reach and all needs met   Therapy Documentation Precautions:  Precautions Precautions: Fall, Other (comment) Precaution Comments: left hemiparesis with left hemi inattention; monitor HR and BP, TEDs and abdominal binder when up Restrictions Weight Bearing Restrictions: No General:   Vital Signs: Therapy Vitals Temp: 98.3 F (36.8 C) Temp Source: Oral Pulse Rate: 63 Resp: 20 BP: (!) 99/49 Patient Position (if appropriate): Lying Oxygen Therapy SpO2: 97 % O2 Device: Room Air Pain:   ADL: ADL Eating: Supervision/safety Where Assessed-Eating: Bed level Grooming: Supervision/safety Where Assessed-Grooming: Bed level Upper Body Bathing: Minimal assistance Where Assessed-Upper Body Bathing: Other (Comment) (3:1) Lower Body Bathing: Dependent Where Assessed-Lower Body Bathing: Edge of bed Upper Body Dressing: Maximal assistance Where Assessed-Upper Body Dressing: Edge of bed Lower Body Dressing: Dependent Where Assessed-Lower Body Dressing: Edge of bed Toileting: Dependent Where Assessed-Toileting: Bedside Commode Toilet Transfer: Dependent Toilet Transfer Method: Other (comment) Customer service manager) Toilet Transfer Equipment: Radiographer, therapeutic: Not assessed Social research officer, government: Not assessed Vision   Perception    Praxis   Exercises:   Other Treatments:     Therapy/Group: Individual Therapy  Tonny Branch 07/22/2020, 6:53 AM

## 2020-07-23 ENCOUNTER — Inpatient Hospital Stay (HOSPITAL_COMMUNITY): Payer: Medicare Other | Admitting: Physical Therapy

## 2020-07-23 ENCOUNTER — Inpatient Hospital Stay (HOSPITAL_COMMUNITY): Payer: Medicare Other | Admitting: Speech Pathology

## 2020-07-23 ENCOUNTER — Inpatient Hospital Stay (HOSPITAL_COMMUNITY): Payer: Medicare Other | Admitting: Occupational Therapy

## 2020-07-23 ENCOUNTER — Inpatient Hospital Stay (HOSPITAL_COMMUNITY): Payer: Medicare Other

## 2020-07-23 NOTE — Progress Notes (Signed)
Physical Therapy Session Note  Patient Details  Name: Tyler Richards MRN: [id number] Date of Birth: 07-05-1951  Today's Date: 07/23/2020 PT Individual Time: 1100-1157 PT Individual Time Calculation (min): 57 min   Short Term Goals: Week 2:  PT Short Term Goal 1 (Week 2): Patient will transfer bed<> wc with MinA consistently PT Short Term Goal 2 (Week 2): Patient will ambulate >48ft with ModA (wc follow if needed) and LRAD PT Short Term Goal 3 (Week 2): Patient will maintain static standing balance with midline oriented for >1 min with MinA  Skilled Therapeutic Interventions/Progress Updates:    Patient received supine in bed, agreeable to PT. He reports 8/10 pain in L knee/shin, but relates it to feeling cold last night. RN alerted so patient could receive rx at beginning of session. PT offering multiple seated rest breaks to assist with pain management throughout session. PT donning TED hose with patient supine, MaxA to don pants supine in bed, ModA to don shirt sitting edge of bed. He required supervision to come to sitting edge of bed. Patient transferring to wc via stand pivot with ModA. PT propelling patient in wc to therapy gym for time management and energy conservation. Patient ambulating 72ft with RW, modified hand grip and consistent verbal and tactile cues for weight shift R. After seated rest break, patient ambulating additional 72ft with RW, modA and manual facilitation of weight shift R. Patient completing standing reaching task with mirror for visual feedback, reaching R outside BOS to encourage full weight shift R. CGA/MinA provided for postural stability. Patient then completing same task in modified single leg stance wit L UE on 4" box to encourage full weight shift R nad engagement of R hip stabilizers. Patient returning to room in TIS wc, seatbelt alarm on, call light within reach, wife at bedside.   Therapy Documentation Precautions:  Precautions Precautions: Fall, Other  (comment) Precaution Comments: left hemiparesis with left hemi inattention; monitor HR and BP, TEDs and abdominal binder when up Restrictions Weight Bearing Restrictions: No    Therapy/Group: Individual Therapy  Karoline Caldwell, PT, DPT, CBIS 07/23/2020, 12:21 PM

## 2020-07-23 NOTE — Progress Notes (Signed)
[facility] PHYSICAL MEDICINE & REHABILITATION PROGRESS NOTE   Subjective/Complaints:  Nursing notes indicate liquid stools, pt does not recall any.  Noabd pain or cramping   ROS: Denies CP, SOB, N/V  Objective:   No results found. Recent Labs    07/21/20 0953  WBC 6.8  HGB 11.4*  HCT 35.1*  PLT 479*   No results for input(s): NA, K, CL, CO2, GLUCOSE, BUN, CREATININE, CALCIUM in the last 72 hours.  Intake/Output Summary (Last 24 hours) at 07/23/2020 0735 Last data filed at 07/22/2020 1321 Gross per 24 hour  Intake 540 ml  Output --  Net 540 ml        Physical Exam: Vital Signs Blood pressure 97/60, pulse (!) 53, temperature 100 F (37.8 C), temperature source Oral, resp. rate 17, height 6' (1.829 m), weight 77 kg, SpO2 100 %.  General: No acute distress Mood and affect are appropriate Heart: Regular rate and rhythm no rubs murmurs or extra sounds Lungs: Clear to auscultation, breathing unlabored, no rales or wheezes Abdomen: Positive bowel sounds, soft nontender to palpation, nondistended Extremities: No clubbing, cyanosis, or edema Skin: No evidence of breakdown, no evidence of rash   Motor: RUE/RLE: 5/5 proximal distal LUE 1+/5 proximal distal LLE: 3/5 proximal distal Increased tone noted in left upper extremity  Assessment/Plan: 1. Functional deficits secondary to Right MCA infarct which require 3+ hours per day of interdisciplinary therapy in a comprehensive inpatient rehab setting.  Physiatrist is providing close team supervision and 24 hour management of active medical problems listed below.  Physiatrist and rehab team continue to assess barriers to discharge/monitor patient progress toward functional and medical goals  Care Tool:  Bathing    Body parts bathed by patient: Left arm, Chest, Abdomen, Right upper leg, Left upper leg, Right lower leg, Face   Body parts bathed by helper: Left lower leg, Right arm, Right lower leg     Bathing assist  Assist Level: Moderate Assistance - Patient 50 - 74% (Min for UB, Mod for LB)     Upper Body Dressing/Undressing Upper body dressing   What is the patient wearing?: Pull over shirt    Upper body assist Assist Level: Moderate Assistance - Patient 50 - 74%    Lower Body Dressing/Undressing Lower body dressing      What is the patient wearing?: Incontinence brief, Pants     Lower body assist Assist for lower body dressing: Maximal Assistance - Patient 25 - 49%     Toileting Toileting    Toileting assist Assist for toileting: Total Assistance - Patient < 25%     Transfers Chair/bed transfer  Transfers assist     Chair/bed transfer assist level: Moderate Assistance - Patient 50 - 74% (stand pivot)     Locomotion Ambulation   Ambulation assist   Ambulation activity did not occur: Safety/medical concerns (required skilled intervention to ambulate 75ft at hallway rail)  Assist level: 2 helpers Assistive device: Other (comment) (3 Musketeer) Max distance: 255ft   Walk 10 feet activity   Assist  Walk 10 feet activity did not occur: Safety/medical concerns  Assist level: 2 helpers Assistive device: Other (comment) (3 Musketeer)   Walk 50 feet activity   Assist Walk 50 feet with 2 turns activity did not occur: Safety/medical concerns  Assist level: 2 helpers Assistive device: Other (comment) (3 Musketeer)    Walk 150 feet activity   Assist Walk 150 feet activity did not occur: Safety/medical concerns  Assist level: 2 helpers Assistive  device: Other (comment) (3 Musketeer)    Walk 10 feet on uneven surface  activity   Assist Walk 10 feet on uneven surfaces activity did not occur: Safety/medical concerns         Wheelchair     Assist Will patient use wheelchair at discharge?: Yes Type of Wheelchair: Manual    Wheelchair assist level: Maximal Assistance - Patient 25 - 49% Max wheelchair distance: 50    Wheelchair 50 feet with 2 turns  activity    Assist        Assist Level: Maximal Assistance - Patient 25 - 49%   Wheelchair 150 feet activity     Assist          Medical Problem List and Plan: 1.  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  Continue CIR PT, OT, SLP  PT, OT, SLP  2.  Antithrombotics: -DVT/anticoagulation: Venous Doppler studies negative.  SCDs.  No heparin given hemorrhage.             -antiplatelet therapy: Aspirin 81 mg daily, currently off of Brilinta given GIB and ICA has reoccluded 3. Pain Management: Tylenol as needed.   Controlled on 10/23 4. Mood: Provide emotional support             -antipsychotic agents: N/A 5. Neuropsych: This patient is?  Fully capable of making decisions on his own behalf. 6. Skin/Wound Care: Routine skin checks 7. Fluids/Electrolytes/Nutrition: Routine in and outs 8.  Constipation, now with diarrhea.   DC fiber, add probiotic , increase immodium - improved  9.  Hyperlipidemia.  Lipitor 10.  Gout.  Colchicine twice daily. Hold due to diarrhea, most recently had flare up in foot and RIght elbow   Monitor for any gout flareups  Controlled  11.  Orthostasis/bradycardia.  ProAmatine 5 mg every 8 hours as well as Florinef 0.1 mg daily.  Monitor with increased mobility.  Follow-up per cardiology services, Order orthostatic vitals  Vitals:   07/22/20 1916 07/23/20 0329  BP:  97/60  Pulse:  (!) 53  Resp: 17 17  Temp: 97.8 F (36.6 C) 100 F (37.8 C)  SpO2:  100%   Overall improved but still with asympt sinus brady HR now in 50-60s 12.  Enterobacter UTI.  Completing course of Bactrim. 13.  Rectal bleeding.  Follow-up GI services.  Currently no plan for endoscopic studies and monitor hemoglobin hematocrit.  Awaiting plan for possible colonoscopy   Hemoglobin 10.4 on 10/20, continue to monitor repeat in  am  14.  Spasticity Left hamstring and Left pectoralis which bothers pt at noc  Baclofen changed to Klonopin 0.25mg  qhs  Botox injected on 10/22,expect improvement later this week 16.  Post stroke dysphagia  D2 thins, advance diet as tolerated  LOS: 14 days A FACE TO FACE EVALUATION WAS PERFORMED  Charlett Blake 07/23/2020, 7:35 AM

## 2020-07-23 NOTE — Progress Notes (Signed)
Physical Therapy Session Note  Patient Details  Name: Tyler Richards MRN: [id number] Date of Birth: 1950-11-09  Today's Date: 07/23/2020 PT Individual Time: 2706-2376 PT Individual Time Calculation (min): 47 min   Short Term Goals: Week 2:  PT Short Term Goal 1 (Week 2): Patient will transfer bed<> wc with MinA consistently PT Short Term Goal 2 (Week 2): Patient will ambulate >2ft with ModA (wc follow if needed) and LRAD PT Short Term Goal 3 (Week 2): Patient will maintain static standing balance with midline oriented for >1 min with MinA  Skilled Therapeutic Interventions/Progress Updates:    Pt received supine in bed with his wife present and pt agreeable to therapy session despite reporting fatigue. Therapist discussed recommendation for custom wheelchair consultation with patient/wife and they are in agreement. Supine>sitting R EOB, HOB elevated, with CGA for safety but pt able to manage L hemibody without assist and bring trunk upright using R UE support on bedrail as needed. Sitting EOB donned shoes max assist for time management. R squat pivot EOB>w/c with heavy min assist for lifting/pivoting hips - pt continues to require cuing for head/hips relationship, especially going towards the R/uninvolved side as he tends to lean his trunk towards the direction he is scooting.  Transported to/from gym in w/c for time management and energy conservation. Sit>stands w/c>RW in preparation for gait training with min/mod assist for lifting and balance due to L lateral lean - cuing for R anterior trunk weight shift with improvement noted.  Gait training ~59ft, ~9ft using RW with mod assist of 1 and +2 w/c follow  - pt unable to keep L hand on RW due to significant flexor tone, provided RW orthotic for 2nd walk to maintain hand on AD but requires max assist for AD management due to tone of L UE - able to advance L LE during swing without assist (though delayed initiation of advancement worsens with  fatigue) demoing adequate step length and step width but once progressing to L stance phase lacks L hip abductor activation causing strong L lean and R hip drop, provided increased cuing/facilitation for this during 2nd walk with significant improvement noted.   Block practice squat pivot transfers to/from EOM focusing on motor planning and increased hip clearance - min assist for pivoting hips both directions - max cuing for head/hips relationship but with that cuing pt demos significantly improved motor planning requiring decreased assist to complete transfer.  Dynamic standing balance task, no UE support, via R/L weight shifting, R lateral reaching, and trunk rotation all with light min assist for balance. Transported back to room in w/c and pt requesting to return to bed. L squat pivot w/c>EOB with min assist. Once sitting EOB doffed shoes max assist then pt attempted to scoot hips towards HOB with feet slipping on floor causing L lateral trunk LOB onto bed - able to recover with min assist. Sit>supine with supervision using bedrail and pt left supine in bed with needs in reach, bed alarm on, and pt's wife present.  Therapy Documentation Precautions:  Precautions Precautions: Fall, Other (comment) Precaution Comments: left hemiparesis with left hemi inattention; monitor HR and BP, TEDs and abdominal binder when up Restrictions Weight Bearing Restrictions: No  Pain:   Denies pain stating "just tired"    Therapy/Group: Individual Therapy  Tawana Scale , PT, DPT, CSRS  07/23/2020, 4:27 PM

## 2020-07-23 NOTE — Progress Notes (Signed)
Occupational Therapy Weekly Progress Note  Patient Details  Name: Tyler Richards MRN: [id number] Date of Birth: [birth year]  Beginning of progress report period: July 17, 2020 End of progress report period: July 23, 2020  Today's Date: 07/23/2020 OT Individual Time: 7062-3762 OT Individual Time Calculation (min): 49 min    Patient has met 4 of 4 short term goals.  Tyler Richards is making steady progress with OT at this time.  He currently completes all bathing sit to stand with min assist.  UB dressing is at a min to mod assist level as well as LB dressing being at mod assist sit to stand.  He is completing stand pivot transfers to the tub bench and the 3:1 at mod assist level with mod facilitation being needed for managing clothing sit to stand.  LUE function continues to be limited as he exhibits increased tone in the shoulder horizontal adductors, the elbow flexors, and the digit flexors.  He is able to activate elbow extension as well as shoulder flexion, but continues to need max assist for integration of the UE into bathing or grooming tasks.  Brunnstrum stage II level in noted in the hand with only trace digit flexion present.  He continues to tolerate his resting hand splint at night.  Tyler Richards continues to demonstrate left visual field deficit with left neglect, but he is able to compensate with head turns to the left when given mod instructional cueing to pick up his washcloth, or reach for an item on the left side.  Overall, endurance continues to improve as well as his functional progress.  Recommend continued CIR level OT with expected discharge on 11/10 with 24 hour supervision/assist from his spouse.    Patient continues to demonstrate the following deficits: muscle weakness and muscle paralysis, impaired timing and sequencing, abnormal tone, unbalanced muscle activation and decreased coordination, decreased attention to left and left side neglect, decreased awareness, decreased problem  solving, decreased safety awareness and decreased memory and decreased sitting balance, decreased standing balance, decreased postural control, hemiplegia and decreased balance strategies and therefore will continue to benefit from skilled OT intervention to enhance overall performance with BADL.  Patient progressing toward long term goals..  Continue plan of care.  OT Short Term Goals Week 3:  OT Short Term Goal 1 (Week 3): Tyler Richards will donn a pullover shirt with min guard assist following hemi techniques. OT Short Term Goal 2 (Week 3): Tyler Richards will complete toilet transfer with min assist stand pivot. OT Short Term Goal 3 (Week 3): Tyler Richards will use the LUE at a gross assist level with min facilitation for holding items to be opened with selfcare tasks. OT Short Term Goal 4 (Week 3): Tyler Richards will complete LB dressing at min assist level sit to stand except for donning TEDs.  Skilled Therapeutic Interventions/Progress Updates:    Tyler Richards worked on bathing and dressing during session.  He was able to transfer from supine to sit EOB with supervision to begin session.  Charlaine Dalton was used for transfer to the walk-in shower secondary to time limitations.  He was able to complete sit to stand on the [place] with min assist.  Max assist was used to help place the LUE on the horizontal bar and maintain throughout standing and for balance when being moved to and from the shower.  He was able to remove UB clothing with supervision, but needed mod assist to remove his LB clothing.  He completed showering sit to stand with overall min assist.  One LOB to the left noted when attempting to reach down and wash his feet, but he was able to regain with min guard assist.  Min assist for sit to stand as well when washing peri area.  Max hand over hand assist was given when attempting to wash the RUE with the LUE.  He transferred out to the EOB for dressing.  Mod demonstrational cueing for orientation of his shirt with mod assist to donn.  He donned his  brief and pants with mod assist and then therapist assisted with TEDs before returning to supine to rest.  Tyler Richards was happy to get a shower and stated this multiple times and that he felt better after it.  Call button and phone in reach with bed alarm in place and spouse in room as well.  Therapy Documentation Precautions:  Precautions Precautions: Fall, Other (comment) Precaution Comments: left hemiparesis with left hemi inattention; monitor HR and BP, TEDs and abdominal binder when up Restrictions Weight Bearing Restrictions: No  Pain: Pain Assessment Pain Score: 0-No pain ADL: See Care Tool Section for some details of mobility and selfcare  Therapy/Group: Individual Therapy  Dahmir Epperly OTR/L 07/23/2020, 4:41 PM

## 2020-07-24 ENCOUNTER — Inpatient Hospital Stay (HOSPITAL_COMMUNITY): Payer: Medicare Other | Admitting: Speech Pathology

## 2020-07-24 ENCOUNTER — Inpatient Hospital Stay (HOSPITAL_COMMUNITY): Payer: Medicare Other

## 2020-07-24 ENCOUNTER — Inpatient Hospital Stay (HOSPITAL_COMMUNITY): Payer: Medicare Other | Admitting: Occupational Therapy

## 2020-07-24 NOTE — Progress Notes (Signed)
Physical Therapy Weekly Progress Note  Patient Details  Name: Tyler Richards MRN: [id number] Date of Birth: Feb 21, 1951  Beginning of progress report period: July 17, 2020 End of progress report period: July 24, 2020   Patient has met 2 of 3 short term goals.  Patient making slow, but steady progress toward his goals. Progress limited by bowel and bladder incontinence, as well as fluctuating levels of fatigue. He requires grossly MinA/ModA for functional mobility at this time. Wife has been present throughout therapy sessions for education regarding how to assist patient and with what aspects of mobility, however, she has not completed hands on training at this time.   Patient continues to demonstrate the following deficits muscle weakness, decreased cardiorespiratoy endurance, decreased visual perceptual skills and decreased visual motor skills, decreased midline orientation, decreased attention to left, left side neglect and decreased motor planning, decreased initiation, decreased attention, decreased awareness, decreased problem solving, decreased safety awareness and delayed processing and decreased sitting balance, decreased standing balance, decreased postural control, hemiplegia and decreased balance strategies and therefore will continue to benefit from skilled PT intervention to increase functional independence with mobility.  Patient progressing toward long term goals..  Continue plan of care.  PT Short Term Goals Week 2:  PT Short Term Goal 1 (Week 2): Patient will transfer bed<> wc with MinA consistently PT Short Term Goal 1 - Progress (Week 2): Progressing toward goal PT Short Term Goal 2 (Week 2): Patient will ambulate >91ft with ModA (wc follow if needed) and LRAD PT Short Term Goal 2 - Progress (Week 2): Met PT Short Term Goal 3 (Week 2): Patient will maintain static standing balance with midline oriented for >1 min with MinA PT Short Term Goal 3 - Progress (Week 2):  Met Week 3:  PT Short Term Goal 1 (Week 3): Patient will transfer bed <> wc with MinA consistently PT Short Term Goal 2 (Week 3): Patient will ambulate >42ft with MinA and LRAD, wc follow if needed PT Short Term Goal 3 (Week 3): Patient will maintain static standing balance with CGA >2 mins and LRAD    Therapy Documentation Precautions:  Precautions Precautions: Fall, Other (comment) Precaution Comments: left hemiparesis with left hemi inattention; monitor HR and BP, TEDs and abdominal binder when up Restrictions Weight Bearing Restrictions: No    Debbora Dus 07/24/2020, 12:12 PM

## 2020-07-24 NOTE — Progress Notes (Signed)
Physical Therapy Session Note  Patient Details  Name: Tyler Richards MRN: [id number] Date of Birth: 23-Sep-1951  Today's Date: 07/24/2020 PT Individual Time: 8032-1224 PT Individual Time Calculation (min): 72 min   Short Term Goals: Week 2:  PT Short Term Goal 1 (Week 2): Patient will transfer bed<> wc with MinA consistently PT Short Term Goal 2 (Week 2): Patient will ambulate >38ft with ModA (wc follow if needed) and LRAD PT Short Term Goal 3 (Week 2): Patient will maintain static standing balance with midline oriented for >1 min with MinA  Skilled Therapeutic Interventions/Progress Updates:    Patient received supine in bed, wife at bedside, agreeable to PT. He denies pain, but states "this morning I thought I was a goner." He reports a "bad morning" with bowel incontinence and low BP. Abdominal binder and TED hose presently on. He was able to come to sitting edge of bed with supervision and verbal cuing. MinA stand pivot to wc. Wife showing PT picture of wc they have access to at home. It's a standard wc with standard leg rests. Wife reports that it "feels light," but that she's never folded it or lifted it into back of car. Patient may benefit from custom wc eval to allow for lighter weight, folding chair for easy and safe transport that meets patients positioning needs. PT propelling patient in wc to therapy gym for time management and energy conservation. He was able to maintain static standing balance with CGA and mirror for visual feedback. With horizontal head turns, patient resumes L lateral lean, but this is correctable with verbal cuing. Patient completing dynamic standing task on solid ground with R UE involvement and CGA matching playing cards. Patient able to remain standing >3 mins with minimal lateral sway noted. Patient able to achieve static standing balance on foam with mirror for visual feedback and ModA/MaxA initially. Patient eventually progressing to CGA on foam. Dynamic reaching  task standing on foam requiring MinA for postural control with mirror and verbal cues for midline orientation. Patient able to ambulate 78ft x2 with MinA x2 HHA. Verbal cues needed for wider BOS and reciprocal stepping. Patient not achieving trailing limb on L LE at this time resulting in step-to pattern. No evidence of L lateral lean or pushing noted during ambulation. Patient able to maneuver wc with B LE and supervision with verbal cues x50 feet before fatiguing. Transfer back to bed via stand pivot with MinA. MinA for sit > supine. Bed alarm on, call light within reach, wife at bedside.   Therapy Documentation Precautions:  Precautions Precautions: Fall, Other (comment) Precaution Comments: left hemiparesis with left hemi inattention; monitor HR and BP, TEDs and abdominal binder when up Restrictions Weight Bearing Restrictions: No    Therapy/Group: Individual Therapy  Debbora Dus 07/24/2020, 7:50 AM

## 2020-07-24 NOTE — Progress Notes (Signed)
[place] PHYSICAL MEDICINE & REHABILITATION PROGRESS NOTE   Subjective/Complaints:  No issues overnite , semiformed stool this am, black but no bright red blood  ROS: Denies CP, SOB, N/V  Objective:   No results found. Recent Labs    07/21/20 0953  WBC 6.8  HGB 11.4*  HCT 35.1*  PLT 479*   No results for input(s): NA, K, CL, CO2, GLUCOSE, BUN, CREATININE, CALCIUM in the last 72 hours.  Intake/Output Summary (Last 24 hours) at 07/24/2020 0741 Last data filed at 07/23/2020 1720 Gross per 24 hour  Intake 660 ml  Output --  Net 660 ml        Physical Exam: Vital Signs Blood pressure (!) 98/55, pulse (!) 56, temperature 98.4 F (36.9 C), resp. rate 17, height 6' (1.829 m), weight 77.2 kg, SpO2 100 %.  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 Functional deficits secondary to Right MCA infarct which require 3+ hours per day of interdisciplinary therapy in a comprehensive inpatient rehab setting.  Physiatrist is providing close team supervision and 24 hour management of active medical problems listed below.  Physiatrist and rehab team continue to assess barriers to discharge/monitor patient progress toward functional and medical goals  Care Tool:  Bathing    Body parts bathed by patient: Left arm, Chest, Abdomen, Right upper leg, Left upper leg, Right lower leg, Face, Front perineal area, Left lower leg   Body parts bathed by helper: Buttocks, Right arm     Bathing assist Assist Level: Minimal  Assistance - Patient > 75%     Upper Body Dressing/Undressing Upper body dressing   What is the patient wearing?: Pull over shirt    Upper body assist Assist Level: Minimal Assistance - Patient > 75%    Lower Body Dressing/Undressing Lower body dressing      What is the patient wearing?: Incontinence brief, Pants     Lower body assist Assist for lower body dressing: Moderate Assistance - Patient 50 - 74%     Toileting Toileting    Toileting assist Assist for toileting: Total Assistance - Patient < 25%     Transfers Chair/bed transfer  Transfers assist     Chair/bed transfer assist level: Minimal Assistance - Patient > 75% (squat pivot)     Locomotion Ambulation   Ambulation assist   Ambulation activity did not occur: Safety/medical concerns (required skilled intervention to ambulate 19ft at hallway rail)  Assist level: 2 helpers (mod A and +2 w/c follow) Assistive device: Walker-rolling Max distance: 29ft   Walk 10 feet activity   Assist  Walk 10 feet activity did not occur: Safety/medical concerns  Assist level: 2 helpers (mod A and +2 w/c follow) Assistive device: Walker-rolling   Walk 50 feet activity   Assist Walk 50 feet with 2 turns activity did not occur: Safety/medical concerns  Assist level: 2 helpers Assistive device: Other (comment) (3 [place]ket[place])    Walk 150 feet activity   Assist Walk 150 feet activity did not occur: Safety/medical concerns  Assist level: 2 helpers Assistive device: Other (comment) (3 Musketeer)  Walk 10 feet on uneven surface  activity   Assist Walk 10 feet on uneven surfaces activity did not occur: Safety/medical concerns         Wheelchair     Assist Will patient use wheelchair at discharge?: Yes Type of Wheelchair: Manual    Wheelchair assist level: Maximal Assistance - Patient 25 - 49% Max wheelchair distance: 50    Wheelchair 50 feet with 2 turns activity    Assist         Assist Level: Maximal Assistance - Patient 25 - 49%   Wheelchair 150 feet activity     Assist          Medical Problem List and Plan: 1.  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  Continue CIR PT, OT, SLP  PT, OT, SLP  2.  Antithrombotics: -DVT/anticoagulation: Venous Doppler studies negative.  SCDs.  No heparin given hemorrhage.             -antiplatelet therapy: Aspirin 81 mg daily, currently off of Brilinta given GIB and ICA has reoccluded 3. Pain Management: Tylenol as needed.   Controlled on 10/23 4. Mood: Provide emotional support             -antipsychotic agents: N/A 5. Neuropsych: This patient is?  Fully capable of making decisions on his own behalf. 6. Skin/Wound Care: Routine skin checks 7. Fluids/Electrolytes/Nutrition: Routine in and outs 8.  Constipation, now with diarrhea.   DC fiber, add probiotic , increase immodium - improved  9.  Hyperlipidemia.  Lipitor 10.  Gout.  Colchicine twice daily. Hold due to diarrhea, most recently had flare up in foot and RIght elbow   Monitor for any gout flareups  Controlled , gouty tophi R>L 1st MTP 11.  Orthostasis/bradycardia.  ProAmatine 5 mg every 8 hours as well as Florinef 0.1 mg daily.  Monitor with increased mobility.  Follow-up per cardiology services, Order orthostatic vitals  Vitals:   07/23/20 1951 07/24/20 0328  BP:  (!) 98/55  Pulse:  (!) 56  Resp: 18 17  Temp: 98.5 F (36.9 C) 98.4 F (36.9 C)  SpO2: 100% 100%   Overall improved but still with asympt sinus brady HR now in 50-60s 12.  Enterobacter UTI.  Completing course of Bactrim. 13.  Rectal bleeding.  Follow-up GI services.  Currently no plan for endoscopic studies and monitor hemoglobin hematocrit.  Awaiting plan for possible colonoscopy   Hemoglobin 10.4 on 10/20, continue to monitor  repeat in am  14.  Spasticity Left hamstring and Left pectoralis which bothers pt at noc  Baclofen changed to Klonopin 0.25mg  qhs  Botox injected on 10/22,expect improvement later this week 16.  Post stroke dysphagia  D2 thins, advance diet as tolerated  LOS: 15 days A FACE TO FACE EVALUATION WAS PERFORMED  Charlett Blake 07/24/2020, 7:41 AM

## 2020-07-24 NOTE — Progress Notes (Signed)
Speech Language Pathology Weekly Progress and Session Note  Patient Details  Name: Tyler Richards MRN: [id number] Date of Birth: [birth year]  Beginning of progress report period: 07/17/2020 End of progress report period:  07/24/2020  Today's Date: 07/24/2020 SLP Individual Time: 1010-1100 SLP Individual Time Calculation (min): 50 min  Short Term Goals: Week 2: SLP Short Term Goal 1 (Week 2): Patient will tolerate trial trays and snacks of Dys 3 solids textures with supervision A for oral clearance. SLP Short Term Goal 1 - Progress (Week 2): Met SLP Short Term Goal 2 (Week 2): Patient will correct errors in mildly complex problem solving and reasoning tasks with minA for attention. SLP Short Term Goal 2 - Progress (Week 2): Met SLP Short Term Goal 3 (Week 2): Patient will demonstrate effective recall and use of strategies for improving overall speech production with supervisionA SLP Short Term Goal 3 - Progress (Week 2): Progressing toward goal SLP Short Term Goal 4 (Week 2): Patient will be able to perform complex level problem solving task (medication management, money management) with min-modA for accuracy. SLP Short Term Goal 4 - Progress (Week 2): Met SLP Short Term Goal 5 (Week 2): Patient will demonstrate anticipatory awareness related to his deficits during ADL's and discussion regarding guided discharge needs planning with modA SLP Short Term Goal 5 - Progress (Week 2): Met    New Short Term Goals: Week 3: SLP Short Term Goal 1 (Week 3): Patient will tolerate trial trays and snacks of Dys 3 solids textures with mod I for oral clearance. SLP Short Term Goal 2 (Week 3): Patient will correct errors in mildly complex problem solving and reasoning tasks with supervision for attention. SLP Short Term Goal 3 (Week 3): Patient will demonstrate effective recall and use of strategies for improving overall speech production with supervisionA SLP Short Term Goal 4 (Week 3): Patient will be  able to perform complex level problem solving task (medication management, money management) with min assist-supervision for accuracy. SLP Short Term Goal 5 (Week 3): Patient will demonstrate anticipatory awareness related to his deficits during ADL's and discussion regarding guided discharge needs planning with min A  Weekly Progress Updates:   Pt has made functional gains this reporting period and has met 4 out of 5 short term goals.  Pt is currently min assist for tasks due to mild cognitive deficits.  Pt has demonstrated improved anticipatory awareness and functional problem solving.  Pt is consuming a dys 2, thin liquids diet with supervision cues for use of swallowing precautions.  Pt and family education is ongoing.  Pt would continue to benefit from skilled ST while inpatient in order to maximize functional independence and reduce burden of care prior to discharge.  Anticipate that pt will need 24/7 supervision at discharge but ST follow up recommendations to be determined pending further progress made while inpatient.      Intensity: Minumum of 1-2 x/day, 30 to 90 minutes Frequency: 3 to 5 out of 7 days Duration/Length of Stay: 11/10 Treatment/Interventions: Cognitive remediation/compensation;Cueing hierarchy;Dysphagia/aspiration precaution training;Functional tasks;Patient/family education;Internal/external aids;Speech/Language facilitation   Daily Session  Skilled Therapeutic Interventions: Pt was seen for skilled ST targeting goals for speech and swallowing.  SLP facilitated the session with therapeutic trials of dys 3 textures to continue working towards diet progression.  Pt consumed advanced textures with supervision cues for use of swallowing precautions and no complaints of globus sensation or overt s/s of aspiration with solids or liquids.  Pt was able to clear advanced  solids from the oral cavity with increased time for mastication.  During functional conversations with the SLP, pt  utilized compensatory strategies to achieve intelligibility at the the conversational level with supervision verbal cues; however, when SLP introduced increased cognitive burden of a novel task while conversing with pt, he needed up to min assist to maintain intelligibility at the conversational level.  SLP discussed the impacts of different situational or environmental stressors on pt's speech and offered strategies for how to compensate in the moment.  Pt was left in bed with bed alarm set and call bell within reach.  Goals updated on this date to reflect current progress and plan of care.    General    Pain Pain Assessment Pain Scale: 0-10 Pain Score: 0-No pain  Therapy/Group: Individual Therapy  Seleny Allbright, Selinda Orion 07/24/2020, 2:39 PM

## 2020-07-24 NOTE — Progress Notes (Signed)
Occupational Therapy Session Note  Patient Details  Name: Tyler Richards MRN: [id number] Date of Birth: [birth year]  Today's Date: 07/24/2020 OT Individual Time: 0800-0900 OT Individual Time Calculation (min): 60 min    Short Term Goals: Week 3:  OT Short Term Goal 1 (Week 3): Pt will donn a pullover shirt with min guard assist following hemi techniques. OT Short Term Goal 2 (Week 3): Pt will complete toilet transfer with min assist stand pivot. OT Short Term Goal 3 (Week 3): Pt will use the LUE at a gross assist level with min facilitation for holding items to be opened with selfcare tasks. OT Short Term Goal 4 (Week 3): Pt will complete LB dressing at min assist level sit to stand except for donning TEDs.  Skilled Therapeutic Interventions/Progress Updates:    Pt in bed to start session, reporting using the bed pan earlier.  He was noted to still have some bowel incontinence in his brief.  Had him transfer to the EOB with supervision to work on cleaning up peri area and donning new brief.  When standing to complete peri washing he demonstrated bowel incontinence again as well as light headedness.  He was taken back down into sitting and transferred to the 3:1 over the toilet to try and complete BM with use of the [place].  Increased light headedness persisted with standing and once pt was cleaned up with total assist after unsuccessful attempt on the 3:1, he was transferred back to the bed.  BP in supine with HOB up 15 degrees and no TEDs was 100/71.  In sitting with TEDs on it decreased to 84/56.  Abdominal binder was added in sitting with it increasing up to 96/58.  He transferred to the wheelchair with mod assist and it was taken again with slight tilt of wheelchair at 98/71.  Finished rest of session in the wheelchair with completion of [employer] for scanning to the left.  He completed 3 trials of 1 min using the lower half of the board.  He averaged 3, 2, and 3.5 seconds with slower times noted  on the left side of the board secondary to his visual field cut.  Returned to room at end of session with pt left sitting up with his spouse.  Call button and phone in reach with safety belt in place.    Therapy Documentation Precautions:  Precautions Precautions: Fall, Other (comment) Precaution Comments: left hemiparesis with left hemi inattention; monitor HR and BP, TEDs and abdominal binder when up Restrictions Weight Bearing Restrictions: No Pain: Pain Assessment Pain Scale: Faces Pain Score: 0-No pain ADL: See Care Tool Section for some details of mobility and selfcare  Therapy/Group: Individual Therapy  Arlan Birks OTR/L 07/24/2020, 9:35 AM

## 2020-07-25 ENCOUNTER — Inpatient Hospital Stay (HOSPITAL_COMMUNITY): Payer: Medicare Other | Admitting: Speech Pathology

## 2020-07-25 ENCOUNTER — Inpatient Hospital Stay (HOSPITAL_COMMUNITY): Payer: Medicare Other | Admitting: Physical Therapy

## 2020-07-25 DIAGNOSIS — I950 Idiopathic hypotension: Secondary | ICD-10-CM

## 2020-07-25 NOTE — Plan of Care (Signed)
  Problem: RH BOWEL ELIMINATION Goal: RH STG MANAGE BOWEL WITH ASSISTANCE Description: STG Manage Bowel with Assistance. Outcome: Not Progressing; per report diarrhea; wife requested imodium for pt this morning   Problem: RH BLADDER ELIMINATION Goal: RH STG MANAGE BLADDER WITH ASSISTANCE Description: STG Manage Bladder With Assistance Outcome: Not Progressing; incontinence

## 2020-07-25 NOTE — Progress Notes (Signed)
   07/25/20 0526  Assess: MEWS Score  Temp (!) 97.4 F (36.3 C)  BP 91/60  Pulse Rate (!) 43  Resp 20  Level of Consciousness Alert  SpO2 98 %  O2 Device Room Air  Patient Activity (if Appropriate) In bed  Assess: MEWS Score  MEWS Temp 0  MEWS Systolic 1  MEWS Pulse 1  MEWS RR 0  MEWS LOC 0  MEWS Score 2  MEWS Score Color Yellow  Assess: if the MEWS score is Yellow or Red  Were vital signs taken at a resting state? Yes  Focused Assessment No change from prior assessment  Early Detection of Sepsis Score *See Row Information* Low  MEWS guidelines implemented *See Row Information* Yes  Treat  MEWS Interventions Escalated (See documentation below)  Take Vital Signs  Increase Vital Sign Frequency  Yellow: Q 2hr X 2 then Q 4hr X 2, if remains yellow, continue Q 4hrs  Escalate  MEWS: Escalate Yellow: discuss with charge nurse/RN and consider discussing with provider and RRT  Notify: Charge Nurse/RN  Name of Charge Nurse/RN Notified Jamie, RN  Date Charge Nurse/RN Notified 07/25/20  Time Charge Nurse/RN Notified 0550  Notify: Provider  Provider Name/Title Dr. Alger Simons  Date Provider Notified 07/25/20  Time Provider Notified 0530  Notification Type Call  Notification Reason Change in status;Other (Comment) (MEWS 2)  Response No new orders  Date of Provider Response 07/25/20  Time of Provider Response 0530  Document  Progress note created (see row info) Yes

## 2020-07-25 NOTE — Progress Notes (Signed)
Physical Therapy Session Note  Patient Details  Name: Tyler Richards MRN: [id number] Date of Birth: [birth year]  Today's Date: 07/25/2020 PT Individual Time: 1013-1106 PT Individual Time Calculation (min): 53 min   Short Term Goals: Week 3:  PT Short Term Goal 1 (Week 3): Patient will transfer bed <> wc with MinA consistently PT Short Term Goal 2 (Week 3): Patient will ambulate >80ft with MinA and LRAD, wc follow if needed PT Short Term Goal 3 (Week 3): Patient will maintain static standing balance with CGA >2 mins and LRAD  Skilled Therapeutic Interventions/Progress Updates:    Pt received supine in bed with his wife present and pt reporting he had a "terrible" night due to his L LE pain that could not be alleviated causing him to "not get any sleep." Despite, this pt agreeable to therapy session. Pt already wearing B LE thigh high TED hose. Supine>sitting R EOB, HOB partially elevated and using R UE support on bedrail, with close supervision for safety. Sitting EOB donned abdominal binder, pants, and shoes max assist for time management. Assessed vitals in sitting: BP 98/60 (MAP 73), HR 63bpm with no reports of dizziness. Sit>stand EOB>no UE support with mod assist for balance due to increased L lean/pushing today with max assist to pull pants up over hips. R squat pivot transfer EOB>w/c with light mod assist for pivoting hips - question cuing for proper head/hips relationship with pt able to recall but required cuing for increased anterior trunk lean.  Transported to/from gym in w/c for time management and energy conservation. Block practice squat pivot transfers w/c<>EOM with focus on pt ability to recall sequencing/technique with min cuing in preparation for D/C home - repeated cuing for head/hips relationship - required min assist with transfers to the L and mod assist towards the R due to pusher tendencies and more difficulty with trunk disassociation. Reassessed vitals: BP 99/65 (MAP 76), HR  63bpm. Dynamic standing balance task focusing on R lateral weight shift with increased L hip/knee extension while reaching R UE to grasp horseshoe then toss towards external target - pt with poor awareness of LEs in staggered stance position (L foot forward) causing strong L lean/LOB requiring mod assist for balance. Pt reports onset of "dizziness" and requests to lie down - assisted pt into supine with min assist. Reassessed vitals in supine: BP 107/65 (MAP 79), HR 65bpm. Once symptoms dissipated returned to sitting with CGA and reassessed vitals in sitting: BP 95/62 (MAP 73), HR 79bpm with pt continuing to deny symptoms. Returned to dynamic standing balance task above with pt again reporting significant onset of dizziness and assessed vitals in sitting: BP 87/64 (MAP 72), HR 68bpm. R squat pivot with mod assist to TIS w/c and tilted back for BP management. Transported back to room - made nursing aware of drop in BP. L squat pivot to EOB with mod assist at this time. Doffed abdominal binder and assisted with sit>supine min assist. Pt reports symptoms have dissipated and starting to feel better. Pt left supine in bed with BLE TEDs on, needs in reach, bed alarm on, and pt's wife present and aware of pt's decreased BP with standing.   Therapy Documentation Precautions:  Precautions Precautions: Fall, Other (comment) Precaution Comments: left hemiparesis with left hemi inattention; monitor HR and BP, TEDs and abdominal binder when up Restrictions Weight Bearing Restrictions: No  Pain:   Reports severe L LE pain last night and continues to report some pain during session though somewhat improved with  recent medication administration.  Therapy/Group: Individual Therapy  Tawana Scale , PT, DPT, CSRS  07/25/2020, 8:05 AM

## 2020-07-25 NOTE — Progress Notes (Signed)
[facility] PHYSICAL MEDICINE & REHABILITATION PROGRESS NOTE   Subjective/Complaints:  Contacted for a MEWS of 2 this morning by nurse. No other concerns. When I entered the room pt's only complaint was left foot/gout pain  ROS: Patient denies fever, rash, sore throat, blurred vision, nausea, vomiting, diarrhea, cough, shortness of breath or chest pain,  headache, or mood change.   Objective:   No results found. No results for input(s): WBC, HGB, HCT, PLT in the last 72 hours. No results for input(s): NA, K, CL, CO2, GLUCOSE, BUN, CREATININE, CALCIUM in the last 72 hours.  Intake/Output Summary (Last 24 hours) at 07/25/2020 0857 Last data filed at 07/25/2020 0700 Gross per 24 hour  Intake 560 ml  Output 400 ml  Net 160 ml        Physical Exam: Vital Signs Blood pressure (!) 92/52, pulse (!) 58, temperature 98.9 F (37.2 C), temperature source Oral, resp. rate 18, height 6' (1.829 m), weight 79.8 kg, SpO2 95 %.  Constitutional: No distress . Vital signs reviewed. HEENT: EOMI, oral membranes moist Neck: supple Cardiovascular: RRR without murmur. No JVD    Respiratory/Chest: CTA Bilaterally without wheezes or rales. 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 Functional deficits secondary to Right MCA infarct which require 3+ hours per day of interdisciplinary therapy in a comprehensive inpatient rehab setting.  Physiatrist is providing close team supervision and 24 hour management of active medical problems listed below.  Physiatrist and rehab team continue to assess barriers to discharge/monitor patient progress toward functional and medical goals  Care  Tool:  Bathing    Body parts bathed by patient: Left arm, Chest, Abdomen, Right upper leg, Left upper leg, Right lower leg, Face, Front perineal area, Left lower leg   Body parts bathed by helper: Buttocks, Right arm     Bathing assist Assist Level: Minimal Assistance - Patient > 75%     Upper Body Dressing/Undressing Upper body dressing   What is the patient wearing?: Pull over shirt    Upper body assist Assist Level: Minimal Assistance - Patient > 75%    Lower Body Dressing/Undressing Lower body dressing      What is the patient wearing?: Incontinence brief, Pants     Lower body assist Assist for lower body dressing: Total Assistance - Patient < 25%     Toileting Toileting    Toileting assist Assist for toileting: Total Assistance - Patient < 25%     Transfers Chair/bed transfer  Transfers assist     Chair/bed transfer assist level: Minimal Assistance - Patient > 75%     Locomotion Ambulation   Ambulation assist   Ambulation activity did not occur: Safety/medical concerns (required skilled intervention to ambulate 62ft at hallway rail)  Assist level: 2 helpers Assistive device: Hand held assist Max distance: 25   Walk 10 feet activity   Assist  Walk 10 feet activity did not occur: Safety/medical concerns  Assist level: 2 helpers Assistive device: Hand held assist   Walk 50 feet activity   Assist Walk 50 feet with 2 turns activity did not occur: Safety/medical concerns  Assist level: 2 helpers Assistive device: Other (comment) (3 Musketeer)    Walk 150 feet  activity   Assist Walk 150 feet activity did not occur: Safety/medical concerns  Assist level: 2 helpers Assistive device: Other (comment) (3 Musketeer)    Walk 10 feet on uneven surface  activity   Assist Walk 10 feet on uneven surfaces activity did not occur: Safety/medical concerns         Wheelchair     Assist Will patient use wheelchair at discharge?: Yes Type  of Wheelchair: Manual    Wheelchair assist level: Supervision/Verbal cueing Max wheelchair distance: 50    Wheelchair 50 feet with 2 turns activity    Assist        Assist Level: Supervision/Verbal cueing   Wheelchair 150 feet activity     Assist      Assist Level: Moderate Assistance - Patient 50 - 74%   Medical Problem List and Plan: 1.  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  Continue CIR PT, OT, SLP   2.  Antithrombotics: -DVT/anticoagulation: Venous Doppler studies negative.  SCDs.  No heparin given hemorrhage.             -antiplatelet therapy: Aspirin 81 mg daily, currently off of Brilinta given GIB and ICA has reoccluded 3. Pain Management: Tylenol as needed.   Controlled on 10/23 4. Mood: Provide emotional support             -antipsychotic agents: N/A 5. Neuropsych: This patient is?  Fully capable of making decisions on his own behalf. 6. Skin/Wound Care: Routine skin checks 7. Fluids/Electrolytes/Nutrition: Routine in and outs 8.  Constipation, now with diarrhea.   DC fiber, add probiotic , increase immodium - improved   10/30 stools formed 9.  Hyperlipidemia.  Lipitor 10.  Gout.  Colchicine twice daily. Held due to diarrhea, most recently had flare up in foot and RIght elbow   Monitor for any gout flareups  Reasonably controlled , gouty tophi R>L 1st MTP 11.  Orthostasis/bradycardia.  ProAmatine 5 mg every 8 hours as well as Florinef 0.1 mg daily.  Monitor with increased mobility.  Follow-up per cardiology services, Order orthostatic vitals  Vitals:   07/25/20 0526 07/25/20 0740  BP: 91/60 (!) 92/52  Pulse: (!) 43 (!) 58  Resp: 20 18  Temp: (!) 97.4 F (36.3 C) 98.9 F (37.2 C)  SpO2: 98% 95%   Overall improved but still with asympt sinus brady HR now in 50-60s  10/30--HR in 40's this  morning. Asymptomatic   -continue with above plan   -encourage appropriate fluids 12.  Enterobacter UTI.  Completing course of Bactrim. 13.  Rectal bleeding.  Follow-up GI services.  Currently no plan for endoscopic studies and monitor hemoglobin hematocrit.  Awaiting plan for possible colonoscopy   Hemoglobin 10.4 on 10/20, continue to monitor repeat in am  14.  Spasticity Left hamstring and Left pectoralis which bothers pt at noc  Baclofen changed to Klonopin 0.25mg  qhs    Botox injected on 10/22, should see improvement soon  16.  Post stroke dysphagia  D2 thins, advance diet as tolerated  LOS: 16 days A FACE TO FACE EVALUATION WAS PERFORMED  Meredith Staggers 07/25/2020, 8:57 AM

## 2020-07-26 MED ORDER — DICLOFENAC SODIUM 1 % EX GEL
2.0000 g | Freq: Three times a day (TID) | CUTANEOUS | Status: DC
  Administered 2020-07-26 – 2020-08-12 (×40): 2 g via TOPICAL
  Filled 2020-07-26 (×2): qty 100

## 2020-07-26 NOTE — Progress Notes (Signed)
[place] PHYSICAL MEDICINE & REHABILITATION PROGRESS NOTE   Subjective/Complaints: Pt lying awake in bed. Left leg sore but he told me it was manageable. RN reported that he was complaining of pain, sometimes perseverating on it  ROS: Patient denies fever, rash, sore throat, blurred vision, nausea, vomiting, diarrhea, cough, shortness of breath or chest pain,  headache, or mood change. .   Objective:   No results found. No results for input(s): WBC, HGB, HCT, PLT in the last 72 hours. No results for input(s): NA, K, CL, CO2, GLUCOSE, BUN, CREATININE, CALCIUM in the last 72 hours.  Intake/Output Summary (Last 24 hours) at 07/26/2020 0816 Last data filed at 07/26/2020 0721 Gross per 24 hour  Intake 410 ml  Output --  Net 410 ml        Physical Exam: Vital Signs Blood pressure (!) 100/58, pulse (!) 51, temperature 97.7 F (36.5 C), temperature source Oral, resp. rate 15, height 6' (1.829 m), weight 77.7 kg, SpO2 97 %.  Constitutional: No distress . Vital signs reviewed. HEENT: EOMI, oral membranes moist Neck: supple Cardiovascular: RRR without murmur. No JVD    Respiratory/Chest: CTA Bilaterally without wheezes or rales. 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 Functional deficits secondary to Right MCA infarct which require 3+ hours per day of interdisciplinary therapy in a comprehensive inpatient rehab setting.  Physiatrist is providing close team supervision and 24 hour management of active medical problems listed below.  Physiatrist and rehab team continue to assess barriers to discharge/monitor patient progress toward  functional and medical goals  Care Tool:  Bathing    Body parts bathed by patient: Left arm, Chest, Abdomen, Right upper leg, Left upper leg, Right lower leg, Face, Front perineal area, Left lower leg   Body parts bathed by helper: Buttocks, Right arm     Bathing assist Assist Level: Minimal Assistance - Patient > 75%     Upper Body Dressing/Undressing Upper body dressing   What is the patient wearing?: Pull over shirt    Upper body assist Assist Level: Minimal Assistance - Patient > 75%    Lower Body Dressing/Undressing Lower body dressing      What is the patient wearing?: Incontinence brief, Pants     Lower body assist Assist for lower body dressing: Total Assistance - Patient < 25%     Toileting Toileting    Toileting assist Assist for toileting: Total Assistance - Patient < 25%     Transfers Chair/bed transfer  Transfers assist     Chair/bed transfer assist level: Moderate Assistance - Patient 50 - 74%     Locomotion Ambulation   Ambulation assist   Ambulation activity did not occur: Safety/medical concerns (required skilled intervention to ambulate 29ft at hallway rail)  Assist level: 2 helpers Assistive device: Hand held assist Max distance: 25   Walk 10 feet activity   Assist  Walk 10 feet activity did not occur: Safety/medical concerns  Assist level: 2 helpers Assistive device: Hand held assist   Walk 50 feet activity   Assist Walk 50 feet with 2 turns activity did not occur: Safety/medical concerns  Assist level: 2 helpers Assistive device: Other (comment) (3 Musketeer)  Walk 150 feet activity   Assist Walk 150 feet activity did not occur: Safety/medical concerns  Assist level: 2 helpers Assistive device: Other (comment) (3 Musketeer)    Walk 10 feet on uneven surface  activity   Assist Walk 10 feet on uneven surfaces activity did not occur: Safety/medical concerns         Wheelchair     Assist Will patient  use wheelchair at discharge?: Yes Type of Wheelchair: Manual    Wheelchair assist level: Supervision/Verbal cueing Max wheelchair distance: 50    Wheelchair 50 feet with 2 turns activity    Assist        Assist Level: Supervision/Verbal cueing   Wheelchair 150 feet activity     Assist      Assist Level: Moderate Assistance - Patient 50 - 74%   Medical Problem List and Plan: 1.  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  Continue CIR PT, OT, SLP   2.  Antithrombotics: -DVT/anticoagulation: Venous Doppler studies negative.  SCDs.  No heparin given hemorrhage.             -antiplatelet therapy: Aspirin 81 mg daily, currently off of Brilinta given GIB and ICA has reoccluded 3. Pain Management: Tylenol as needed.   10/31 appears generally controlled despite conflicting reports   -asked nurse to distract him when possible   -utilize ice/heat as well   -rx gout as below 4. Mood: Provide emotional support             -antipsychotic agents: N/A 5. Neuropsych: This patient is?  Fully capable of making decisions on his own behalf. 6. Skin/Wound Care: Routine skin checks 7. Fluids/Electrolytes/Nutrition: Routine in and outs 8.  Constipation, now with diarrhea.   DC fiber, add probiotic , increase immodium - improved   10/30 stools formed 9.  Hyperlipidemia.  Lipitor 10.  Gout.  Colchicine twice daily. Held due to diarrhea, most recently had flare up in foot and RIght elbow   Monitor for any gout flareups  Reasonably controlled , gouty tophi R>L 1st MTP 11.  Orthostasis/bradycardia.  ProAmatine 5 mg every 8 hours as well as Florinef 0.1 mg daily.  Monitor with increased mobility.  Follow-up per cardiology services, Order orthostatic vitals  Vitals:   07/25/20 1913 07/26/20 0326  BP: 113/68 (!) 100/58  Pulse: (!) 45  (!) 51  Resp: 18 15  Temp: 97.6 F (36.4 C) 97.7 F (36.5 C)  SpO2: 100% 97%   Overall improved but still with asympt sinus brady HR now in 50-60s  10/31--HR in 40's to 50's. Asymptomatic   -continue with above plan   -encourage appropriate fluids 12.  Enterobacter UTI.  Completing course of Bactrim. 13.  Rectal bleeding.  Follow-up GI services.  Currently no plan for endoscopic studies and monitor hemoglobin hematocrit.  Awaiting plan for possible colonoscopy   Hemoglobin 10.4 on 10/20, continue to monitor repeat in am  14.  Spasticity Left hamstring and Left pectoralis which bothers pt at noc  Baclofen changed to Klonopin 0.25mg  qhs    Botox injected on 10/22, with some early improvement?  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

## 2020-07-26 NOTE — Progress Notes (Signed)
Patient claims voltaren gel helps with pain on his back/left leg . MD notified frequency changed per order.

## 2020-07-26 NOTE — Plan of Care (Signed)
  Problem: RH BOWEL ELIMINATION Goal: RH STG MANAGE BOWEL WITH ASSISTANCE Description: STG Manage Bowel with Assistance. Outcome: Not Progressing; on and off diarrhea    Problem: RH BLADDER ELIMINATION Goal: RH STG MANAGE BLADDER WITH ASSISTANCE Description: STG Manage Bladder With Assistance Outcome: Not Progressing; incontinence

## 2020-07-27 ENCOUNTER — Inpatient Hospital Stay (HOSPITAL_COMMUNITY): Payer: Medicare Other | Admitting: Speech Pathology

## 2020-07-27 ENCOUNTER — Inpatient Hospital Stay (HOSPITAL_COMMUNITY): Payer: Medicare Other

## 2020-07-27 ENCOUNTER — Inpatient Hospital Stay (HOSPITAL_COMMUNITY): Payer: Medicare Other | Admitting: Occupational Therapy

## 2020-07-27 LAB — CBC
HCT: 27.7 % — ABNORMAL LOW (ref 39.0–52.0)
Hemoglobin: 9 g/dL — ABNORMAL LOW (ref 13.0–17.0)
MCH: 32.7 pg (ref 26.0–34.0)
MCHC: 32.5 g/dL (ref 30.0–36.0)
MCV: 100.7 fL — ABNORMAL HIGH (ref 80.0–100.0)
Platelets: 256 10*3/uL (ref 150–400)
RBC: 2.75 MIL/uL — ABNORMAL LOW (ref 4.22–5.81)
RDW: 14.9 % (ref 11.5–15.5)
WBC: 6.4 10*3/uL (ref 4.0–10.5)
nRBC: 0 % (ref 0.0–0.2)

## 2020-07-27 LAB — BASIC METABOLIC PANEL
Anion gap: 6 (ref 5–15)
BUN: 18 mg/dL (ref 8–23)
CO2: 27 mmol/L (ref 22–32)
Calcium: 8.7 mg/dL — ABNORMAL LOW (ref 8.9–10.3)
Chloride: 104 mmol/L (ref 98–111)
Creatinine, Ser: 0.78 mg/dL (ref 0.61–1.24)
GFR, Estimated: >60 mL/min (ref >60)
Glucose, Bld: 124 mg/dL — ABNORMAL HIGH (ref 70–99)
Potassium: 3.5 mmol/L (ref 3.5–5.1)
Sodium: 137 mmol/L (ref 135–145)

## 2020-07-27 MED ORDER — SODIUM CHLORIDE 0.45 % IV SOLN: INTRAVENOUS | Status: DC

## 2020-07-27 NOTE — Progress Notes (Signed)
Speech Language Pathology Daily Session Note  Patient Details  Name: Tyler Richards MRN: [id number] Date of Birth: 02-Aug-1951  Today's Date: 07/27/2020 SLP Individual Time: 1300-1356 SLP Individual Time Calculation (min): 56 min  Short Term Goals: Week 3: SLP Short Term Goal 1 (Week 3): Patient will tolerate trial trays and snacks of Dys 3 solids textures with mod I for oral clearance. SLP Short Term Goal 2 (Week 3): Patient will correct errors in mildly complex problem solving and reasoning tasks with supervision for attention. SLP Short Term Goal 3 (Week 3): Patient will demonstrate effective recall and use of strategies for improving overall speech production with supervisionA SLP Short Term Goal 4 (Week 3): Patient will be able to perform complex level problem solving task (medication management, money management) with min assist-supervision for accuracy. SLP Short Term Goal 5 (Week 3): Patient will demonstrate anticipatory awareness related to his deficits during ADL's and discussion regarding guided discharge needs planning with min A  Skilled Therapeutic Interventions: Pt was seen for skilled ST targeting dysphagia and cognitive goals. Upon arrival, pt consuming puree lunch with wife assisting. Pt had been downgraded to Dys 1 textures (puree) due to pt preference. [name] oral transit and clearance of puree and thins is efficient and no overt s/sx aspiration observed across thin or puree. Discussed that pt's swallow function of Dys 2 and 3 textures has also been efficient with SLP present, but that if preference is to stay on puree textures that is acceptable. Recommend continue current diet and will continue to check with to check in with pt and his wife regarding if/when they wish to explore advancement again. SLP further facilitated session with Min A verbal and visual cues for visual scanning left during functional self feeding and medication management tasks. Pt continues to required Mod A  verbal cues for error awareness and Min A for problem solving when organizing a TID pill box according to his list of medications. He also required Mod A to recall that this task had been targeted X2 in [place]. Wife was present throughout session and SLP made aware that this is example of a complex task pt would require close assistance/supervision with at discharge. She was in agreement. Pt left sitting up in bed with alarm set and needs within reach. Continue per current plan of care.          Pain Pain Assessment Pain Scale: Faces Faces Pain Scale: No hurt  Therapy/Group: Individual Therapy  Arbutus Leas 07/27/2020, 3:03 PM

## 2020-07-27 NOTE — Progress Notes (Signed)
Occupational Therapy Session Note  Patient Details  Name: Tyler Richards MRN: [id number] Date of Birth: 1951-02-20  Today's Date: 07/27/2020 OT Individual Time: 7893-8101 OT Individual Time Calculation (min): 55 min    Short Term Goals: Week 3:  OT Short Term Goal 1 (Week 3): Pt will donn a pullover shirt with min guard assist following hemi techniques. OT Short Term Goal 2 (Week 3): Pt will complete toilet transfer with min assist stand pivot. OT Short Term Goal 3 (Week 3): Pt will use the LUE at a gross assist level with min facilitation for holding items to be opened with selfcare tasks. OT Short Term Goal 4 (Week 3): Pt will complete LB dressing at min assist level sit to stand except for donning TEDs.  Skilled Therapeutic Interventions/Progress Updates:    Pt in bed to start session, agreeable to therapy and completion of shower.  He was able to transfer to the wheelchair with mod assist and was then taken to the shower with completion of stand pivot transfer to the tub bench at the same level.  He worked on bathing with min assist overall for UB and for washing LB without standing.  After completion of 75% of task, pt reported feeling lightheaded.  Before being able to rinse off, he passed out in the shower, requiring total assist to pivot back to the tilt in space wheelchair and therapist to tilt him back.  After approximately 45 seconds he came back too and BP was taken in reclined position at 112/80.  He transferred back to the bed with mod assist and returned to supine with min facilitation.  BP taken in supine at 114/80.  Had him transition to sitting to check the BP and it decreased 87/55.  Pt returned back to supine to complete dressing secondary to BP being low.  He needed overall max assist to complete with supervision for rolling side to side in the bed.  Finished session with pt in the bed and call button and phone in reach.  Safety alarm in place.    Therapy  Documentation Precautions:  Precautions Precautions: Fall, Other (comment) Precaution Comments: left hemiparesis with left hemi inattention; monitor HR and BP, TEDs and abdominal binder when up Restrictions Weight Bearing Restrictions: No  Vital Signs: Therapy Vitals Pulse Rate: 62 Resp: 19 BP: 112/80 Patient Position (if appropriate): Lying Oxygen Therapy SpO2: 98 % O2 Device: Room Air Pain: Pain Assessment Pain Scale: Faces Pain Score: 0-No pain Pain Type: Acute pain Pain Location: Leg Pain Orientation: Left ADL: See Care Tool Section for some details of mobility and selfcare  Therapy/Group: Individual Therapy  Fuller Makin OTR/L 07/27/2020, 10:28 AM

## 2020-07-27 NOTE — Plan of Care (Signed)
°  Problem: RH Memory Goal: LTG Patient will use memory compensatory aids to (SLP) Description: LTG:  Patient will use memory compensatory aids to recall biographical/new, daily complex information with cues (SLP) Flowsheets (Taken 07/27/2020 1511) LTG: Patient will use memory compensatory aids to (SLP): Minimal Assistance - Patient > 75% Note: Initiated due to pt's decreased recall/carryover of events and strategies in ST and other functional information

## 2020-07-27 NOTE — Progress Notes (Addendum)
[place] PHYSICAL MEDICINE & REHABILITATION PROGRESS NOTE   Subjective/Complaints:   Pt had diarrhea over the weekend , some shin pain as well  Hypotensive episode with OT while in shower and sitting up after bending over, poor oral intake  ROS: Patient denies NVD, CP, SOB  Objective:   No results found. No results for input(s): WBC, HGB, HCT, PLT in the last 72 hours. No results for input(s): NA, K, CL, CO2, GLUCOSE, BUN, CREATININE, CALCIUM in the last 72 hours.  Intake/Output Summary (Last 24 hours) at 07/27/2020 0746 Last data filed at 07/26/2020 1725 Gross per 24 hour  Intake 45 ml  Output --  Net 45 ml        Physical Exam: Vital Signs Blood pressure 98/69, pulse (!) 59, temperature 97.9 F (36.6 C), resp. rate 16, height 6' (1.829 m), weight 76 kg, SpO2 98 %.   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 Functional deficits secondary to Right MCA infarct which require 3+ hours per day of interdisciplinary therapy in a comprehensive inpatient rehab setting.  Physiatrist is providing close team supervision and 24 hour management of active medical problems listed below.  Physiatrist and rehab team continue to assess barriers to discharge/monitor patient progress toward functional and medical goals  Care Tool:  Bathing    Body parts bathed by patient: Left arm, Chest,  Abdomen, Right upper leg, Left upper leg, Right lower leg, Face, Front perineal area, Left lower leg   Body parts bathed by helper: Buttocks, Right arm     Bathing assist Assist Level: Minimal Assistance - Patient > 75%     Upper Body Dressing/Undressing Upper body dressing   What is the patient wearing?: Pull over shirt    Upper body assist Assist Level: Minimal Assistance - Patient > 75%    Lower Body Dressing/Undressing Lower body dressing      What is the patient wearing?: Incontinence brief, Pants     Lower body assist Assist for lower body dressing: Total Assistance - Patient < 25%     Toileting Toileting    Toileting assist Assist for toileting: Total Assistance - Patient < 25%     Transfers Chair/bed transfer  Transfers assist     Chair/bed transfer assist level: Moderate Assistance - Patient 50 - 74%     Locomotion Ambulation   Ambulation assist   Ambulation activity did not occur: Safety/medical concerns (required skilled intervention to ambulate 73ft at hallway rail)  Assist level: 2 helpers Assistive device: Hand held assist Max distance: 25   Walk 10 feet activity   Assist  Walk 10 feet activity did not occur: Safety/medical concerns  Assist level: 2 helpers Assistive device: Hand held assist   Walk 50 feet activity   Assist Walk 50 feet with 2 turns activity did not occur: Safety/medical concerns  Assist level: 2 helpers Assistive device: Other (comment) (3 Musketeer)    Walk 150 feet activity   Assist Walk  150 feet activity did not occur: Safety/medical concerns  Assist level: 2 helpers Assistive device: Other (comment) (3 Musketeer)    Walk 10 feet on uneven surface  activity   Assist Walk 10 feet on uneven surfaces activity did not occur: Safety/medical concerns         Wheelchair     Assist Will patient use wheelchair at discharge?: Yes Type of Wheelchair: Manual    Wheelchair assist level:  Supervision/Verbal cueing Max wheelchair distance: 50    Wheelchair 50 feet with 2 turns activity    Assist        Assist Level: Supervision/Verbal cueing   Wheelchair 150 feet activity     Assist      Assist Level: Moderate Assistance - Patient 50 - 74%   Medical Problem List and Plan: 1.  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  Continue CIR PT, OT, SLP   2.  Antithrombotics: -DVT/anticoagulation: Venous Doppler studies negative.  SCDs.  No heparin given hemorrhage.             -antiplatelet therapy: Aspirin 81 mg daily, currently off of Brilinta given GIB and ICA has reoccluded 3. Pain Management: Tylenol as needed.   10/31 appears generally controlled despite conflicting reports   -asked nurse to distract him when possible   -utilize ice/heat as well   -rx gout as below 4. Mood: Provide emotional support             -antipsychotic agents: N/A 5. Neuropsych: This patient is?  Fully capable of making decisions on his own behalf. 6. Skin/Wound Care: Routine skin checks 7. Fluids/Electrolytes/Nutrition: Routine in and outs 8.  Constipation, now with diarrhea.   DC fiber, add probiotic , increase immodium - improved   10/30 stools formed 9.  Hyperlipidemia.  Lipitor 10.  Gout.  Colchicine twice daily. Held due to diarrhea, most recently had flare up in foot and RIght elbow   Monitor for any gout flareups  Reasonably controlled , gouty tophi R>L 1st MTP- no evidence of flare up  11.  Orthostasis/bradycardia.  ProAmatine 5 mg every 8 hours as well as Florinef 0.1 mg daily.  Monitor with increased mobility.  Follow-up per cardiology services, Order orthostatic vitals  Vitals:   07/26/20 1931 07/27/20 0312  BP: (!) 104/59 98/69  Pulse: (!) 56 (!) 59  Resp: 16 16  Temp: 98.9 F (37.2 C) 97.9 F (36.6 C)   SpO2: 100% 98%   Overall improved but still with asympt sinus brady HR now in 50-60s  Recurrent orthostasis , check BMET poor oral intake, had diarrhea over weekend but not this am  12.  Enterobacter UTI.  Completing course of Bactrim. 13.  Rectal bleeding.  Follow-up GI services.  Currently no plan for endoscopic studies and monitor hemoglobin hematocrit.  Awaiting plan for possible colonoscopy   Hemoglobin 10.4 on 10/20, continue to monitor repeat in am  14.  Spasticity Left hamstring and Left pectoralis which bothers pt at noc  Baclofen changed to Klonopin 0.25mg  qhs    Botox injected on 10/22, with some early improvement?  16.  Post stroke dysphagia  D2 thins, advance diet as tolerated  LOS: 18 days A FACE TO FACE EVALUATION WAS PERFORMED  Charlett Blake 07/27/2020, 7:46 AM

## 2020-07-27 NOTE — Progress Notes (Signed)
Physical Therapy Session Note  Patient Details  Name: Tyler Richards MRN: [id number] Date of Birth: 01-31-1951  Today's Date: 07/27/2020 PT Individual Time: 1000-1018 PT Individual Time Calculation (min): 18 min  and Today's Date: 07/27/2020 PT Missed Time: 42 Minutes Missed Time Reason: Patient fatigue;Patient unwilling to participate;Patient ill (Comment) (low BP, not agreeable to bed-level)  Short Term Goals: Week 3:  PT Short Term Goal 1 (Week 3): Patient will transfer bed <> wc with MinA consistently PT Short Term Goal 2 (Week 3): Patient will ambulate >66ft with MinA and LRAD, wc follow if needed PT Short Term Goal 3 (Week 3): Patient will maintain static standing balance with CGA >2 mins and LRAD  Skilled Therapeutic Interventions/Progress Updates:    Patient received supine in bed, wife at bedside. He denies pain, but reports feeling "very crummy" from orthostatic event in shower this AM. BP supine in bed: 94/54 with pulse of 49 BPM. Patient appearing very pale and diaphoretic in bed. He requests to not leave bed and to rest and not participate in PT. PT educating patient and wife re: importance of fluid intake, especially with episodes of diarrhea with blood in stool. Patient verbalizing understanding. PT educated patient and wife on not remaining 100% supine throughout full day and to sit up if he/BP can tolerate it for meals/fluid intake to allow for BP to normalize to upright positioning. Patient and wife verbalizing understanding. Patient remaining in bed, bed alarm on, call light within reach, wife at bedside. RN aware of patients BP while supine.   Therapy Documentation Precautions:  Precautions Precautions: Fall, Other (comment) Precaution Comments: left hemiparesis with left hemi inattention; monitor HR and BP, TEDs and abdominal binder when up Restrictions Weight Bearing Restrictions: No    Therapy/Group: Individual Therapy  Karoline Caldwell, PT, DPT,  CBIS 07/27/2020, 7:30 AM

## 2020-07-28 ENCOUNTER — Inpatient Hospital Stay (HOSPITAL_COMMUNITY): Payer: Medicare Other | Admitting: Physical Therapy

## 2020-07-28 ENCOUNTER — Inpatient Hospital Stay (HOSPITAL_COMMUNITY): Payer: Medicare Other | Admitting: Occupational Therapy

## 2020-07-28 ENCOUNTER — Inpatient Hospital Stay (HOSPITAL_COMMUNITY): Payer: Medicare Other | Admitting: Speech Pathology

## 2020-07-28 LAB — HEMOGLOBIN AND HEMATOCRIT, BLOOD
HCT: 27.4 % — ABNORMAL LOW (ref 39.0–52.0)
Hemoglobin: 8.9 g/dL — ABNORMAL LOW (ref 13.0–17.0)

## 2020-07-28 MED ORDER — LOPERAMIDE HCL 2 MG PO CAPS: 4.0000 mg | ORAL_CAPSULE | Freq: Three times a day (TID) | ORAL | Status: DC

## 2020-07-28 MED ORDER — LOPERAMIDE HCL 2 MG PO CAPS
4.0000 mg | ORAL_CAPSULE | Freq: Two times a day (BID) | ORAL | Status: DC
  Administered 2020-07-28 – 2020-08-02 (×10): 4 mg via ORAL
  Filled 2020-07-28 (×10): qty 2

## 2020-07-28 NOTE — Progress Notes (Signed)
Patient used call bell for assistance. He was found with feces on his fingers and smeared on top of blanket and sheets although brief was still intact. Apologized stating "I don't know what got into me." States he must have had a dream and thought he was at his sister's house, asking where am I. He was easily reoriented, cleaned and linen changed. He was then able to answer questions appropriately and recognized he had an IV running and which side was his affected vs non-affected side. However, he began ringing call bell repeatedly asking questions such as "what unit of the hospital am I in?.Marland KitchenMarland KitchenWhat type of injury do I have?" He then removed hospital gown (without disturbing IV) then called to state "I'm cold". He then asked "could someone come talk to me?" Will kick blankets off then ask for them to be put back on. Despite staff attempting to meet all of his needs, this behavior still continues.

## 2020-07-28 NOTE — Progress Notes (Signed)
Speech Language Pathology Daily Session Note  Patient Details  Name: Tyler Richards MRN: [id number] Date of Birth: 1951-01-25  Today's Date: 07/28/2020 SLP Individual Time: 5883-2549 SLP Individual Time Calculation (min): 55 min  Short Term Goals: Week 3: SLP Short Term Goal 1 (Week 3): Patient will tolerate trial trays and snacks of Dys 3 solids textures with mod I for oral clearance. SLP Short Term Goal 2 (Week 3): Patient will correct errors in mildly complex problem solving and reasoning tasks with supervision for attention. SLP Short Term Goal 3 (Week 3): Patient will demonstrate effective recall and use of strategies for improving overall speech production with supervisionA SLP Short Term Goal 4 (Week 3): Patient will be able to perform complex level problem solving task (medication management, money management) with min assist-supervision for accuracy. SLP Short Term Goal 5 (Week 3): Patient will demonstrate anticipatory awareness related to his deficits during ADL's and discussion regarding guided discharge needs planning with min A  Skilled Therapeutic Interventions: Pt was seen for skilled ST targeting cognitive goals. Pt acknowledged that he had a "bad night" and felt confused, however no acute increase/change in confusion or cognitive function noted during session (in comparison to previous visits). SLP facilitated session with overall Min A verbal and visual cues for organization, problem solving, error awareness, and visual scanning during a semi-complex calendar/scheduling task. Cues also required X2 for attention to detail/identifying and abbreviating key information to include on calendar. Pt with relatively good insight into his performance on this task and level of assistance required for him to complete it successfully. He reports he uses a calendar at home and agrees this will be an importance compensatory memory strategy to aid in functioning at home after d/c. He also continues  to express that his preference is to remain on Dys 1 (puree) textures as opposed to work toward solid advancement at this time - will continue to monitor. Pt left sitting in wheelchair with alarm set and needs within reach, wife present at bedside. Continue per current plan of care.          Pain Pain Assessment Pain Scale: 0-10 Pain Score: 0-No pain  Therapy/Group: Individual Therapy  Arbutus Leas 07/28/2020, 3:07 PM

## 2020-07-28 NOTE — Progress Notes (Signed)
[place] PHYSICAL MEDICINE & REHABILITATION PROGRESS NOTE   Subjective/Complaints:  IVF overnite, BPs stronger Pt now able to handle urinal per LPN Discussed scheduling Immodium, some nurses not administering prn ROS: Patient denies NVD, CP, SOB  Objective:   No results found. Recent Labs    07/27/20 0956  WBC 6.4  HGB 9.0*  HCT 27.7*  PLT 256   Recent Labs    07/27/20 0956  NA 137  K 3.5  CL 104  CO2 27  GLUCOSE 124*  BUN 18  CREATININE 0.78  CALCIUM 8.7*    Intake/Output Summary (Last 24 hours) at 07/28/2020 0758 Last data filed at 07/28/2020 0600 Gross per 24 hour  Intake 1536.05 ml  Output 850 ml  Net 686.05 ml        Physical Exam: Vital Signs Blood pressure 124/62, pulse (!) 45, temperature 97.6 F (36.4 C), resp. rate 20, height 6' (1.829 m), weight 77.8 kg, SpO2 100 %.  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 Functional deficits secondary to Right MCA infarct which require 3+ hours per day of interdisciplinary therapy in a comprehensive inpatient rehab setting.  Physiatrist is providing close team supervision and 24 hour management of active medical problems listed below.  Physiatrist and rehab team continue to assess barriers to discharge/monitor patient progress toward functional and medical goals  Care Tool:  Bathing    Body parts bathed by  patient: Left arm, Chest, Abdomen, Right upper leg, Left upper leg, Right lower leg, Face, Front perineal area   Body parts bathed by helper: Right arm, Left lower leg Body parts n/a: Buttocks   Bathing assist Assist Level: Minimal Assistance - Patient > 75%     Upper Body Dressing/Undressing Upper body dressing   What is the patient wearing?: Pull over shirt    Upper body assist Assist Level: Maximal Assistance - Patient 25 - 49%    Lower Body Dressing/Undressing Lower body dressing      What is the patient wearing?: Incontinence brief, Pants     Lower body assist Assist for lower body dressing: Total Assistance - Patient < 25%     Toileting Toileting    Toileting assist Assist for toileting: Total Assistance - Patient < 25%     Transfers Chair/bed transfer  Transfers assist     Chair/bed transfer assist level: Moderate Assistance - Patient 50 - 74%     Locomotion Ambulation   Ambulation assist   Ambulation activity did not occur: Safety/medical concerns (required skilled intervention to ambulate 68ft at hallway rail)  Assist level: 2 helpers Assistive device: Hand held assist Max distance: 25   Walk 10 feet activity   Assist  Walk 10 feet activity did not occur: Safety/medical concerns  Assist level: 2 helpers Assistive device: Hand held assist   Walk 50 feet activity   Assist Walk 50 feet with 2 turns activity did not occur: Safety/medical concerns  Assist level: 2 helpers  Assistive device: Other (comment) (3 Musketeer)    Walk 150 feet activity   Assist Walk 150 feet activity did not occur: Safety/medical concerns  Assist level: 2 helpers Assistive device: Other (comment) (3 Musketeer)    Walk 10 feet on uneven surface  activity   Assist Walk 10 feet on uneven surfaces activity did not occur: Safety/medical concerns         Wheelchair     Assist Will patient use wheelchair at discharge?: Yes Type of Wheelchair: Manual     Wheelchair assist level: Supervision/Verbal cueing Max wheelchair distance: 50    Wheelchair 50 feet with 2 turns activity    Assist        Assist Level: Supervision/Verbal cueing   Wheelchair 150 feet activity     Assist      Assist Level: Moderate Assistance - Patient 50 - 74%   Medical Problem List and Plan: 1.  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  Continue CIR PT, OT, SLP- team conf in am    2.  Antithrombotics: -DVT/anticoagulation: Venous Doppler studies negative.  SCDs.  No heparin given hemorrhage.             -antiplatelet therapy: Aspirin 81 mg daily, currently off of Brilinta given GIB and ICA has reoccluded 3. Pain Management: Tylenol as needed.   10/31 appears generally controlled despite conflicting reports   -asked nurse to distract him when possible   -utilize ice/heat as well   -rx gout as below 4. Mood: Provide emotional support             -antipsychotic agents: N/A 5. Neuropsych: This patient is?  Fully capable of making decisions on his own behalf. 6. Skin/Wound Care: Routine skin checks 7. Fluids/Electrolytes/Nutrition: Routine in and outs 8.  Constipation, now with diarrhea.   DC fiber, add probiotic , increase immodium - improved   10/30 stools formed 9.  Hyperlipidemia.  Lipitor 10.  Gout.  Colchicine twice daily. Held due to diarrhea, most recently had flare up in foot and RIght elbow   Monitor for any gout flareups  Reasonably controlled , gouty tophi R>L 1st MTP- no evidence of flare up  11.  Orthostasis/bradycardia.  ProAmatine 5 mg every 8 hours as well as Florinef 0.1 mg daily.  Monitor with increased mobility.  Follow-up per cardiology services, Order orthostatic vitals  Vitals:   07/28/20 0415 07/28/20 0557  BP: 120/73 124/62  Pulse: (!) 58 (!) 45  Resp: 18 20   Temp: (!) 97.5 F (36.4 C) 97.6 F (36.4 C)  SpO2: 97% 100%   Overall improved but still with asympt sinus brady HR now in 50-60s  Recurrent orthostasis , check BMET poor oral intake, had diarrhea over weekend but not this am  12.  Enterobacter UTI.  Completing course of Bactrim. 13.  Rectal bleeding.  Follow-up GI services.  Currently no plan for endoscopic studies and monitor hemoglobin hematocrit.  Awaiting plan for possible colonoscopy   Hemoglobin 10.4 on 10/20, continue to monitor repeat in am  14.  Spasticity Left hamstring and Left pectoralis which bothers pt at noc  Baclofen changed to Klonopin 0.25mg  qhs    Botox injected on 10/22, with some early improvement?  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

## 2020-07-28 NOTE — Progress Notes (Addendum)
Occupational Therapy Session Note  Patient Details  Name: Tyler Richards: [id number] Date of Birth: [birth year]  Today's Date: 07/28/2020 OT Individual Time: 2993-7169 OT Individual Time Calculation (min): 41 min    Short Term Goals: Week 3:  OT Short Term Goal 1 (Week 3): Pt will donn a pullover shirt with min guard assist following hemi techniques. OT Short Term Goal 2 (Week 3): Pt will complete toilet transfer with min assist stand pivot. OT Short Term Goal 3 (Week 3): Pt will use the LUE at a gross assist level with min facilitation for holding items to be opened with selfcare tasks. OT Short Term Goal 4 (Week 3): Pt will complete LB dressing at min assist level sit to stand except for donning TEDs.  Skilled Therapeutic Interventions/Progress Updates:    Session 1: [phone]) Pt in bed to start session on bed pan.  Min assist for rolling to get off of the bedpan with max assist to complete cleaning buttocks and donning new brief.  BP taken in supine at 101/63.  He transitioned to sitting with supervision and BP was taken again with thigh high TEDs in place at 107/59.  He worked on dressing from the [place].  Min assist with mod instructional cueing for donning a pullover shirt.  Mod assist for donning his pants sit to stand as he placed the LLE in the right leg to start and needed mod instructional cueing to recognize this and correct it.  He donned his shoes with mod assist as well and total assist for tying them.  He completed stand pivot transfer to the wheelchair to finish session with mod assist using the RW.  Decreased ability to turn completely square with the chair and pull the LLE back before sitting.  BP taken in the wheelchair at 118/73 with binder in place as well.  Call button and phone in reach and safety belt in place.  Nursing made aware of BPs and that pt is sitting up in the wheelchair.    Session 2: [phone])  Pt worked on toilet transfers X 2 during session, ambulating  into and out of the bathroom with mod assist hand held.  He was able to complete clothing management with mod assist as well as hygiene sit to stand.  Took him down to the therapy gym for work on the LUE on the mat.  Completed stretching to the internal rotators of the shoulder as well as the elbow flexors.  Had pt work on scapular adduction with LUE in weightbearing to assist with stretching of the shoulder.  Had him focus on movements of shoulder flexion and elbow extension to try and push the bedside table forward as well using the LUE and mod assist.  He continues to demonstrate increased tone in the elbow flexors, making it difficulty to activate elbow extension.  Finished session with return to the room and transfer back to the bed with mod assist stand pivot.  Encouraged pt and spouse to continue working on PROM to the LUE later this evening, following the handouts given.    Therapy Documentation Precautions:  Precautions Precautions: Fall, Other (comment) Precaution Comments: left hemiparesis with left hemi inattention; monitor HR and BP, TEDs and abdominal binder when up Restrictions Weight Bearing Restrictions: No  Pain: Pain Assessment Pain Scale: Faces Pain Score: 0-No pain ADL: See Care Tool Section for some details of mobility and selfcare  Therapy/Group: Individual Therapy  Yehia Mcbain OTR/L 07/28/2020, 12:15 PM

## 2020-07-28 NOTE — Progress Notes (Signed)
Physical Therapy Session Note  Patient Details  Name: Tyler Richards MRN: [id number] Date of Birth: 02/07/1951  Today's Date: 07/28/2020 PT Individual Time: 1105-1207 PT Individual Time Calculation (min): 62 min   Short Term Goals: Week 3:  PT Short Term Goal 1 (Week 3): Patient will transfer bed <> wc with MinA consistently PT Short Term Goal 2 (Week 3): Patient will ambulate >62ft with MinA and LRAD, wc follow if needed PT Short Term Goal 3 (Week 3): Patient will maintain static standing balance with CGA >2 mins and LRAD  Skilled Therapeutic Interventions/Progress Updates:    Patient received sitting up in wc, agreeable to PT. He denies pain at this time. Custom manual wc eval completed with Corene Cornea, [employer] present. Discussed getting custom K5 manual wc for patient to allow for safe and efficient propulsion of wc as independently as possible. As well as J3 back to provide minimal assist for postural control in sitting, but appropriate height to allow for maximal UE use for wc propulsion. ROHO cushion discussed to provide patient with appropriate pressure relief as he will likely be a full time wc user. Patient able to alert PT that he had to use the restroom. MinA stand pivot to bed to use urinal. Patient unable to urinate at this time. He was able to ambulate ~60ft using HHA ModA x1 and intermittent manual facilitation of weight shifts from 2nd assist. Patient reporting feeling "faint" after ambulating this distance requiring seated rest break. BP: 94/78. After seated rest break, he was able to ambulate additional ~64ft back to his room with HHA ModA x1 and verbal cues for appropriate step length B, weight shifts and manual facilitation to minimize L posterior trunk rotation. ROHO cushion re-inflated properly to allow for max comfort and pressure relief. Patient remaining up in wc, seatbelt alarm on, call light within reach, wife at bedside. Wife provided with business card of [place], [employer].   Therapy  Documentation Precautions:  Precautions Precautions: Fall, Other (comment) Precaution Comments: left hemiparesis with left hemi inattention; monitor HR and BP, TEDs and abdominal binder when up Restrictions Weight Bearing Restrictions: No    Therapy/Group: Individual Therapy  Karoline Caldwell, PT, DPT, CBIS 07/28/2020, 12:31 PM

## 2020-07-29 ENCOUNTER — Inpatient Hospital Stay (HOSPITAL_COMMUNITY): Payer: Medicare Other

## 2020-07-29 ENCOUNTER — Inpatient Hospital Stay (HOSPITAL_COMMUNITY): Payer: Medicare Other | Admitting: *Deleted

## 2020-07-29 ENCOUNTER — Inpatient Hospital Stay (HOSPITAL_COMMUNITY): Payer: Medicare Other | Admitting: Speech Pathology

## 2020-07-29 ENCOUNTER — Inpatient Hospital Stay (HOSPITAL_COMMUNITY): Payer: Medicare Other | Admitting: Occupational Therapy

## 2020-07-29 MED ORDER — MIDODRINE HCL 5 MG PO TABS
10.0000 mg | ORAL_TABLET | Freq: Two times a day (BID) | ORAL | Status: DC
  Administered 2020-07-29 – 2020-07-30 (×3): 10 mg via ORAL
  Filled 2020-07-29 (×3): qty 2

## 2020-07-29 NOTE — Progress Notes (Signed)
Patient ID: Tyler Richards, male   DOB: Jul 01, 1951, 69 y.o.   MRN: [id number] Team Conference Report to Patient/Family  Team Conference discussion was reviewed with the patient and caregiver, including goals, any changes in plan of care and target discharge date.  Patient and caregiver express understanding and are in agreement.  The patient has a target discharge date of 08/12/20.  [name] 07/29/2020, 1:22 PM

## 2020-07-29 NOTE — Progress Notes (Signed)
Physical Therapy Session Note  Patient Details  Name: Tyler Richards MRN: [id number] Date of Birth: 1951/03/30  Today's Date: 07/29/2020 PT Individual Time: 1130-1200 PT Individual Time Calculation (min): 30 min   Short Term Goals: Week 3:  PT Short Term Goal 1 (Week 3): Patient will transfer bed <> wc with MinA consistently PT Short Term Goal 2 (Week 3): Patient will ambulate >54ft with MinA and LRAD, wc follow if needed PT Short Term Goal 3 (Week 3): Patient will maintain static standing balance with CGA >2 mins and LRAD  Skilled Therapeutic Interventions/Progress Updates:    Patient received sitting up in wc, agreeable to PT. He denies pain. Wife at bedside. PT discussed rationale behind extending dc with patient and wife- both verbalized understanding. PT educating patient on pros/cons of K4 vs K5 wc and associated copays with each. PT directed patient and wife to contact [facility] with further financial questions. Patient and wife stating that they will "think about it" at this time and alert PT to their decision when they've made one. Patient able to ambulate ~29ft using RW and modified hand grip on L UE with ModA. Increasing flexor tone in L UE deeming modified hand grip not feasible to use at this time. Patient ambulating additional 70ft using HHA and ModA for postural stability. With increased gait speed, patient progressing to MinA for postural stability, likely related to the decreased stance time needed on L LE, which increases L lateral lean. BP remained stable, but low throughout therapy session with Abd binder and TED hose on. Patient returning to bed via stand pivot with MinA. Bed alarm on, call light within reach, wife at bedside.   Therapy Documentation Precautions:  Precautions Precautions: Fall, Other (comment) Precaution Comments: left hemiparesis with left hemi inattention; monitor HR and BP, TEDs and abdominal binder when up Restrictions Weight Bearing Restrictions:  No    Therapy/Group: Individual Therapy  Karoline Caldwell, PT, DPT, CBIS 07/29/2020, 7:47 AM

## 2020-07-29 NOTE — Progress Notes (Signed)
Speech Language Pathology Daily Session Note  Patient Details  Name: Tyler Richards MRN: [id number] Date of Birth: 12-06-1950  Today's Date: 07/29/2020 SLP Individual Time: 2761-4709 SLP Individual Time Calculation (min): 55 min  Short Term Goals: Week 3: SLP Short Term Goal 1 (Week 3): Patient will tolerate trial trays and snacks of Dys 3 solids textures with mod I for oral clearance. SLP Short Term Goal 2 (Week 3): Patient will correct errors in mildly complex problem solving and reasoning tasks with supervision for attention. SLP Short Term Goal 3 (Week 3): Patient will demonstrate effective recall and use of strategies for improving overall speech production with supervisionA SLP Short Term Goal 4 (Week 3): Patient will be able to perform complex level problem solving task (medication management, money management) with min assist-supervision for accuracy. SLP Short Term Goal 5 (Week 3): Patient will demonstrate anticipatory awareness related to his deficits during ADL's and discussion regarding guided discharge needs planning with min A  Skilled Therapeutic Interventions: Pt was seen for skilled ST targeting cognitive goals. SLP facilitated session with a task targeting use of compensatory memory strategy to increase short term recall. Pt used associations to recall categories and items he listed within a category with Min faded to Supervision A verbal cues. Associations seems be a relatively meaningful and successful compensatory memory strategy for this pt. SLP further facilitated session with Min A verbal cues for visual scanning and problem solving to identify 3 differences between photographs. He sustained attention to tasks with Supervision verbal cues for redirection today. Pt left sitting in wheelchair with alarm set and needs within reach. Continue per current plan of care.        Pain Pain Assessment Pain Scale: 0-10 Pain Score: 0-No pain  Therapy/Group: Individual  Therapy  Arbutus Leas 07/29/2020, 3:01 PM

## 2020-07-29 NOTE — Patient Care Conference (Signed)
Inpatient RehabilitationTeam Conference and Plan of Care Update Date: 07/29/2020   Time: 10:55 AM  Patient Name: Tyler Richards      Medical Record Number: [id number]  Date of Birth: 1951-09-16 Sex: Male         Room/Bed: 4M02C/4M02C-01 Payor Info: Payor: MEDICARE / Plan: MEDICARE PART A AND B / Product Type: *No Product type* /    Admit Date/Time:  07/09/2020  3:40 PM  Primary Diagnosis:  Right middle cerebral artery stroke [facility])  Hospital Problems: Principal Problem:   Right middle cerebral artery stroke [facility]) Active Problems:   Dysphagia, post-stroke   Rectal bleeding   Diarrhea   Spastic hemiparesis [facility])    Expected Discharge Date: Expected Discharge Date: 08/12/20  Team Members Present: Physician leading conference: Dr. Alysia Penna Care Coodinator Present: Dorien Chihuahua, RN, BSN, CRRN;Christina [place], [place] Nurse Present: Rayne Du, LPN PT Present: Estevan Ryder, PT OT Present: Clyda Greener, OT SLP Present: Jettie Booze, CF-SLP PPS Coordinator present : Ileana Ladd, Burna Mortimer, SLP     Current Status/Progress Goal Weekly Team Focus  Bowel/Bladder   Continent of bladder with few periods of incontince. LBM 11/2 continues to be loose and tarry.  timed toileting while awake  Assess bowel and bladder qshift and PRN   Swallow/Nutrition/ Hydration   Downgraded to Dys 1 per pt preference/MD recc. Mod I strategies  Mod I  carryover swallow strategies, work toward solid advancement again as desired   ADL's   min assist for UB bathing with mod assist for LB bathing.  UB dressing is at min with LB dressing at mod assist.  Still with increased tone in the Left arm with max hand over hand assist needed for integration as a stabilizer.  Limited by hypotension as well as loose bloddy stools.  min assist  functional transfers, selfcare retraining, transfer training, balance retraining, DME education, therapeutic exercise   Mobility   CGA/ SPV bed mobility,  MinA/ModA STS, MinA squat pivot or ModA stand pivot, gait up to 279ft using +2 modA with HHA, L inattention/R gaze preference maintained  min assist overall at ambulatory level  L attention, transfer training, gait training, wc mobility (patient receiving custom wc), family education, activity tolerance, L LE NMR   Communication   90-95% intelligible, Mod I-Supervision  Mod I  carryover speech strategies during functional tasks   Safety/Cognition/ Behavioral Observations  Supervision-Min basic, Min-Mod more complex and recall  Supervision-Mod I  semi-complex to complex problem solving, error awareness, recall   Pain   no c/o pain PRN tylenol ordered.  pain less than 3  assess pain qshift and PRN   Skin   MASD to bottom  No new skin breakdown, keep patient dry  assess skin qshift and PRN     Discharge Planning:  Discharging home with spouse. Has grab bars, wheelchair, rolling walker and cane. Mobile home, 2 steps to enter, R side railings   Team Discussion: Bowel medications modified per MD. Hgb is stable. IVF for orthostasis and poor po intake. Progress impaired by pushing left, right gaze preference, anxiety, fear of falling and cognitive issues/poor carry over along with medical issues. Patient on target to meet rehab goals: yes, min assist goals set  *See Care Plan and progress notes for long and short-term goals.   Revisions to Treatment Plan:  SLP addressing cognition, carry over and speech intelligibility with dysphagia diet  Recommend extension as more time needed once medical issues stabilized Teaching Needs: Transfers, toileting, medications, etc.  Current Barriers to Discharge: Decreased caregiver support and Insurance for SNF coverage  Possible Resolutions to Barriers: Family education with spouse; has DME       Medical Summary Current Status: Hemoglobin a bit lower but now stable.  Blood pressure soft IV fluids at night diarrhea overall improved on Imodium  scheduled.  Barriers to Discharge: Medical stability   Possible Resolutions to Barriers/Weekly Focus: Continue to monitor blood pressure as well as hemoglobin monitor for gout flareup off colchicine   Continued Need for Acute Rehabilitation Level of Care: The patient requires daily medical management by a physician with specialized training in physical medicine and rehabilitation for the following reasons: Direction of a multidisciplinary physical rehabilitation program to maximize functional independence : Yes Medical management of patient stability for increased activity during participation in an intensive rehabilitation regime.: Yes Analysis of laboratory values and/or radiology reports with any subsequent need for medication adjustment and/or medical intervention. : Yes   I attest that I was present, lead the team conference, and concur with the assessment and plan of the team.   Dorien Chihuahua B 07/29/2020, 1:41 PM

## 2020-07-29 NOTE — Progress Notes (Signed)
Physical Therapy Session Note  Patient Details  Name: Tyler Richards MRN: [id number] Date of Birth: 09-27-1950  Today's Date: 07/29/2020 PT Individual Time: 1303-1350 PT Individual Time Calculation (min): 47 min   Short Term Goals: Week 3:  PT Short Term Goal 1 (Week 3): Patient will transfer bed <> wc with MinA consistently PT Short Term Goal 2 (Week 3): Patient will ambulate >59ft with MinA and LRAD, wc follow if needed PT Short Term Goal 3 (Week 3): Patient will maintain static standing balance with CGA >2 mins and LRAD  Skilled Therapeutic Interventions/Progress Updates: Pt presented in bed agreeable to therapy. Pt denies pain at start of session. Pt indicated possible need for urinary void. BP checked in supine 98/68 (78) HR 46.  Abdominal binder donned and performed bed mobility supine to sit with CGA and use of bed features. BP checked 101/62 (75) HR 70. Pt then performed STS with modA (HHA) and performed ambulated with HHA and modA with +2 present for safety. Pt required assist for LLE placement due to strong adduction. Pt required minA with max cues for toilet transfer due to pt's increased impulsiveness and attempting to sit before completely turned and maxA for clothing management (-urinary void). Pt then ambulated back to TIS in same manner as prior. Pt then transported to rehab gym and then performed stand pivot transfer with minA. Participated in standing balance activities with HHA, reaching for cards and placing on board placing with emphasis on L reach with RUE. Pt was able to maintain fair static balance and minimal pressure placed in PTA hand with LLE when reaching/placing cards. Pt did require x1 seated rest due to pt feeling "swimming" however BP unable to be obtained in timely manner due to no Dynamap present in rehab gym. After 3 min rest pt stating feeling better therefore agreeable to continue with standing activity. Once x 3 rounds completed BP checked 93/68 (76) HR 76. Pt then  performed ambulatory transfer back to TIS and transported back to room. Pt left in TIS due to next therapy session starting in 10 min. Pt left with call bell with in reach and wife present with current needs met.      Therapy Documentation Precautions:  Precautions Precautions: Fall, Other (comment) Precaution Comments: left hemiparesis with left hemi inattention; monitor HR and BP, TEDs and abdominal binder when up Restrictions Weight Bearing Restrictions: No General:   Vital Signs: Therapy Vitals Temp: 97.9 F (36.6 C) Temp Source: Oral Pulse Rate: 63 Resp: 18 BP: 99/65 Patient Position (if appropriate): Sitting Oxygen Therapy SpO2: 99 % O2 Device: Room Air Pain: Pain Assessment Pain Scale: 0-10 Pain Score: 0-No pain    Therapy/Group: Individual Therapy  Curley Fayette  Phelix Fudala, PTA  07/29/2020, 3:57 PM

## 2020-07-29 NOTE — Progress Notes (Signed)
Speech Language Pathology Daily Session Note  Patient Details  Name: Tyler Richards MRN: [id number] Date of Birth: 07-10-1951  Today's Date: 07/30/2020 SLP Individual Time: 1000-1057 SLP Individual Time Calculation (min): 57 min  Short Term Goals: Week 3: SLP Short Term Goal 1 (Week 3): Patient will tolerate trial trays and snacks of Dys 3 solids textures with mod I for oral clearance. SLP Short Term Goal 2 (Week 3): Patient will correct errors in mildly complex problem solving and reasoning tasks with supervision for attention. SLP Short Term Goal 3 (Week 3): Patient will demonstrate effective recall and use of strategies for improving overall speech production with supervisionA SLP Short Term Goal 4 (Week 3): Patient will be able to perform complex level problem solving task (medication management, money management) with min assist-supervision for accuracy. SLP Short Term Goal 5 (Week 3): Patient will demonstrate anticipatory awareness related to his deficits during ADL's and discussion regarding guided discharge needs planning with min A  Skilled Therapeutic Interventions: Pt was seen for skilled ST targeting cognitive goals. [name] wife present for session. Pt completed a semi-complex deductive reasoning organization task (Jan's closet) with overall Mod A verbal and visual cueing required for error awareness and problem solving, although he used organizational strategies with Min faded to Supervision. SLP further facilitated session with Mod faded to [place] A verbal and visual cues for visual scanning to left, as well as problem solving and error awareness during a semi-complex PEG board task. Pt expressed urinary urgency during session and was ultimately incontinent of urine. NT and SLP transferred pt from chair to bed to clean and change brief; pt required SLP's Min A verbal cues for sequencing and attention to task when transferring and during bed mobility. Otherwise overall Supervision A verbal cues  provided for redirection throughout session. Pt left laying in bed with alarm set and needs within reach, wife still present. Continue per current plan of care.          Pain Pain Assessment Pain Scale: Faces Faces Pain Scale: No hurt  Therapy/Group: Individual Therapy  Arbutus Leas 07/30/2020, 12:03 PM

## 2020-07-29 NOTE — Progress Notes (Signed)
Occupational Therapy Session Note  Patient Details  Name: Tyler Richards MRN: [id number] Date of Birth: [birth year]  Today's Date: 07/29/2020 OT Individual Time: 7564-3329 OT Individual Time Calculation (min): 70 min    Short Term Goals: Week 3:  OT Short Term Goal 1 (Week 3): Pt will donn a pullover shirt with min guard assist following hemi techniques. OT Short Term Goal 2 (Week 3): Pt will complete toilet transfer with min assist stand pivot. OT Short Term Goal 3 (Week 3): Pt will use the LUE at a gross assist level with min facilitation for holding items to be opened with selfcare tasks. OT Short Term Goal 4 (Week 3): Pt will complete LB dressing at min assist level sit to stand except for donning TEDs.  Skilled Therapeutic Interventions/Progress Updates:    Pt in bed pleasant and ready to participate.  BP taken in supine at 94/61.  With transition to sitting with supervision his BP was taken again at 103/55.  Had pt work on bathing sit to stand with overall min assist for UB and mod assist for LB when standing to wash peri area and buttocks.  Pt with increased dizziness with standing while washing.  Once he sat back down, his BP was 87/54 and then 102/69.  Pt with increased fatigue reported after this episode, so returned to supine and TEDs donned.  He sat back up to work on dressing with BP at 118/77 with TEDs and binder on.  Min assist with mod demonstrational cueing for hemidressing to donn a pullover.  He needed mod assist for donning LB clothing sit to stand.  Next, he transferred to the wheelchair with mod assist.  Finished session with work on [employer] elbow extension, shoulder horizontal abduction, and shoulder flexion.  Educated spouse on how to assist with this as well.  He completed 10 repetitions for 1 set of each exercise.  Call button and phone in reach with safety belt in place.  [name] spouse also in the room.     Therapy Documentation Precautions:  Precautions Precautions: Fall,  Other (comment) Precaution Comments: left hemiparesis with left hemi inattention; monitor HR and BP, TEDs and abdominal binder when up Restrictions Weight Bearing Restrictions: No Pain: Pain Assessment Pain Scale: Faces Pain Score: 0-No pain Pain Type: Acute pain Pain Location: Sacrum Pain Intervention(s): Medication (See eMAR) ADL: See Care Tool Section for some details of mobility and selfcare  Therapy/Group: Individual Therapy  Nika Yazzie OTR/L 07/29/2020, 10:41 AM

## 2020-07-29 NOTE — Progress Notes (Signed)
[place] PHYSICAL MEDICINE & REHABILITATION PROGRESS NOTE   Subjective/Complaints:  Reviewed lab work, hemoglobin stable despite IV fluids. ROS: Patient denies NVD, CP, SOB  Objective:   No results found. Recent Labs    07/27/20 0956 07/28/20 0720  WBC 6.4  --   HGB 9.0* 8.9*  HCT 27.7* 27.4*  PLT 256  --    Recent Labs    07/27/20 0956  NA 137  K 3.5  CL 104  CO2 27  GLUCOSE 124*  BUN 18  CREATININE 0.78  CALCIUM 8.7*    Intake/Output Summary (Last 24 hours) at 07/29/2020 0929 Last data filed at 07/29/2020 0700 Gross per 24 hour  Intake 600 ml  Output --  Net 600 ml        Physical Exam: Vital Signs Blood pressure (!) 118/52, pulse 60, temperature 97.9 F (36.6 C), temperature source Oral, resp. rate 16, height 6' (1.829 m), weight 77.8 kg, SpO2 99 %.   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 Functional deficits secondary to Right MCA infarct which require 3+ hours per day of interdisciplinary therapy in a comprehensive inpatient rehab setting.  Physiatrist is providing close team supervision and 24 hour management of active medical problems listed below.  Physiatrist and rehab team continue to assess barriers to discharge/monitor patient progress toward functional and medical goals  Care Tool:  Bathing    Body parts bathed by patient: Left arm,  Chest, Abdomen, Right upper leg, Left upper leg, Right lower leg, Face, Front perineal area   Body parts bathed by helper: Right arm, Left lower leg Body parts n/a: Buttocks   Bathing assist Assist Level: Minimal Assistance - Patient > 75%     Upper Body Dressing/Undressing Upper body dressing   What is the patient wearing?: Pull over shirt    Upper body assist Assist Level: Minimal Assistance - Patient > 75%    Lower Body Dressing/Undressing Lower body dressing      What is the patient wearing?: Incontinence brief, Pants     Lower body assist Assist for lower body dressing: Moderate Assistance - Patient 50 - 74%     Toileting Toileting    Toileting assist Assist for toileting: Moderate Assistance - Patient 50 - 74%     Transfers Chair/bed transfer  Transfers assist     Chair/bed transfer assist level: Moderate Assistance - Patient 50 - 74%     Locomotion Ambulation   Ambulation assist   Ambulation activity did not occur: Safety/medical concerns (required skilled intervention to ambulate 78ft at hallway rail)  Assist level: 2 helpers Assistive device: Hand held assist Max distance: 40   Walk 10 feet activity   Assist  Walk 10 feet activity did not occur: Safety/medical concerns  Assist level: 2 helpers Assistive device: Hand held assist   Walk 50 feet activity   Assist Walk 50 feet with 2 turns activity did not occur: Safety/medical concerns  Assist level:  2 helpers Assistive device: Other (comment) (3 Musketeer)    Walk 150 feet activity   Assist Walk 150 feet activity did not occur: Safety/medical concerns  Assist level: 2 helpers Assistive device: Other (comment) (3 Musketeer)    Walk 10 feet on uneven surface  activity   Assist Walk 10 feet on uneven surfaces activity did not occur: Safety/medical concerns         Wheelchair     Assist Will patient use wheelchair at discharge?: Yes Type of Wheelchair: Manual     Wheelchair assist level: Supervision/Verbal cueing Max wheelchair distance: 50    Wheelchair 50 feet with 2 turns activity    Assist        Assist Level: Supervision/Verbal cueing   Wheelchair 150 feet activity     Assist      Assist Level: Moderate Assistance - Patient 50 - 74%   Medical Problem List and Plan: 1.  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  Continue CIR PT, OT, SLP Team conference today please see physician documentation under team conference tab, met with team  to discuss problems,progress, and goals. Formulized individual treatment plan based on medical history, underlying problem and comorbidities.    2.  Antithrombotics: -DVT/anticoagulation: Venous Doppler studies negative.  SCDs.  No heparin given hemorrhage.             -antiplatelet therapy: Aspirin 81 mg daily, currently off of Brilinta given GIB and ICA has reoccluded 3. Pain Management: Tylenol as needed.   No current pain complaints. 4. Mood: Provide emotional support             -antipsychotic agents: N/A 5. Neuropsych: This patient is?  Fully capable of making decisions on his own behalf. 6. Skin/Wound Care: Routine skin checks 7. Fluids/Electrolytes/Nutrition: Routine in and outs 8.  Constipation, now with diarrhea.   DC fiber, add probiotic , increase immodium - improved   10/30 stools formed 9.  Hyperlipidemia.  Lipitor 10.  Gout.  Colchicine twice daily. Held due to diarrhea, most recently had flare up in foot and RIght elbow   Monitor for any gout flareups  Reasonably controlled , gouty tophi R>L 1st MTP- no evidence of flare up  11.  Orthostasis/bradycardia.  ProAmatine 5 mg every 8 hours as well as Florinef 0.1 mg daily.  Monitor with increased mobility.  Follow-up per cardiology services, Order orthostatic  vitals  Vitals:   07/28/20 1944 07/29/20 0629  BP: 115/68 (!) 118/52  Pulse: (!) 53 60  Resp: 15 16  Temp: 98.1 F (36.7 C) 97.9 F (36.6 C)  SpO2: 100% 99%   Overall improved but still with asympt sinus brady HR now in 50-60s  Recurrent orthostasis , check BMET poor oral intake, had diarrhea over weekend but not this am  12.  Enterobacter UTI.  Completing course of Bactrim. 13.  Rectal bleeding.  Follow-up GI services.  Currently no plan for endoscopic studies and monitor hemoglobin hematocrit.  Awaiting plan for possible colonoscopy   Hemoglobin 10.4 on 10/20, continue to monitor repeat in am  14.  Spasticity Left hamstring and Left pectoralis which bothers pt at noc  Baclofen changed to Klonopin 0.$RemoveBefor'25mg'[id number]$  qhs    Botox left pectoralis has reduced tone from MAS 3 to MAS 2, Botox the hamstring has reduced tone MAS 1.  Elbow flexor tone still at MAS 2/3.  Will likely need to repeat as outpatient 16.  Post stroke dysphagia  D2 thins, advance diet as tolerated  LOS: 20 days A FACE TO FACE EVALUATION WAS PERFORMED  Charlett Blake 07/29/2020, 9:29 AM

## 2020-07-30 ENCOUNTER — Ambulatory Visit (HOSPITAL_COMMUNITY): Payer: Medicare Other

## 2020-07-30 ENCOUNTER — Encounter (HOSPITAL_COMMUNITY): Payer: Medicare Other | Admitting: Speech Pathology

## 2020-07-30 ENCOUNTER — Encounter (HOSPITAL_COMMUNITY): Payer: Medicare Other | Admitting: Occupational Therapy

## 2020-07-30 MED ORDER — GABAPENTIN 300 MG PO CAPS
300.0000 mg | ORAL_CAPSULE | Freq: Three times a day (TID) | ORAL | Status: DC
  Administered 2020-07-30 – 2020-08-12 (×39): 300 mg via ORAL
  Filled 2020-07-30 (×39): qty 1

## 2020-07-30 NOTE — Progress Notes (Signed)
Occupational Therapy Weekly Progress Note  Patient Details  Name: Tyler Richards MRN: [id number] Date of Birth: 03/14/1951  Beginning of progress report period: July 24, 2020 End of progress report period: July 30, 2020  Today's Date: 07/30/2020 OT Individual Time: 0907-1000 OT Individual Time Calculation (min): 53 min    Patient has met 0 of 4 short term goals.  Tyler Richards is making steady progress with OT but is still limited by medical issues related to hypotension.  He is limited most of the time in sitting as well as standing secondary to increased dizziness, requiring him to lay back down in the bed or be tilted back in the wheelchair.   He is able to complete UB bathing and dressing at min assist level.  LB bathing and dressing are at mod assist sit to stand as well as stand pivot transfers.  Tyler Richards continues to exhibit RUE hemiparesis and needs max assist for integration into bathing tasks.  Increased tone is noted in the left elbow flexors, the chest, and the digit flexors.  Overall feel he will continue to benefit from CIR level therapy, however the medical issues of increased hypotension continue to limit his ability to actively participate.  Because of this, his LOS was extended to 11/17 in order to maximize his potential and reach min assist level.    Patient continues to demonstrate the following deficits: muscle weakness and muscle paralysis, impaired timing and sequencing, unbalanced muscle activation and decreased coordination, field cut, decreased attention to left and left side neglect, decreased awareness and decreased memory and decreased sitting balance, decreased standing balance, decreased postural control, hemiplegia and decreased balance strategies and therefore will continue to benefit from skilled OT intervention to enhance overall performance with BADL and Reduce care partner burden.  Patient progressing toward long term goals..  Continue plan of care.  OT Short  Term Goals Week 4:  OT Short Term Goal 1 (Week 4): Pt will donn a pullover shirt with min guard assist following hemi techniques. OT Short Term Goal 2 (Week 4): Pt will complete toilet transfer with min assist stand pivot. OT Short Term Goal 3 (Week 4): Pt will use the LUE at a gross assist level with min facilitation for holding items to be opened with selfcare tasks. OT Short Term Goal 4 (Week 4): Pt will complete LB dressing at min assist level sit to stand except for donning TEDs.  Skilled Therapeutic Interventions/Progress Updates:    Pt in bed to start session, requesting to go to the bathroom.  Thigh high TEDs and gripper socks donned in supine prior to sitting.  He completed supine to sit with supervision.  Therapist also donned his abdominal binder as well and then had pt ambulate to the bathroom with mod assist.  He was able to complete clothing management in sit to stand with mod assist and mod demonstrational cueing.  Once complete he was transferred stand pivot to the tilt in space wheelchair with mod assist secondary to report of increased dizziness.  BP taken at 100/60 with HR at 78.  He worked on [employer] dressing from the chair with increased dizziness and swimmy headedness reported when reaching down to his lower legs.  BP taken again multiple times at 99/58 and 92/56.  He needed mod assist to donn his pants with mod assist to donn his shoes as well.  Finished session with AAROM and stretching to the internal rotators and elbow flexors of the LUE.  Completed AAROM shoulder  flexion and abduction as well as elbow extension with max assist.  Sets of 5-6 repetitions completed for each joint.    Therapy Documentation Precautions:  Precautions Precautions: Fall, Other (comment) Precaution Comments: left hemiparesis with left hemi inattention; monitor HR and BP, TEDs and abdominal binder when up Restrictions Weight Bearing Restrictions: No  Pain: Pain Assessment Pain Scale: Faces Pain Score:  0-No pain Faces Pain Scale: No hurt Pain Type: Acute pain Pain Location: Leg Pain Orientation: Left Pain Descriptors / Indicators: Aching;Throbbing Pain Frequency: Constant Pain Onset: On-going Patients Stated Pain Goal: 1 Pain Intervention(s): Medication (See eMAR) Multiple Pain Sites: No ADL: See Care Tool Section for some details of mobility and selfcare  Therapy/Group: Individual Therapy  Phinley Schall OTR/L 07/30/2020, 12:21 PM

## 2020-07-30 NOTE — Progress Notes (Signed)
[place] PHYSICAL MEDICINE & REHABILITATION PROGRESS NOTE   Subjective/Complaints: Complains of LLE pain. Feels aching. He slept well last night but had not slept well last two prior nights. Does not feel like gout.  Continues to have orthostasis with ambulation.  . ROS: Patient denies NVD, CP, SOB  Objective:   No results found. Recent Labs    07/27/20 0956 07/28/20 0720  WBC 6.4  --   HGB 9.0* 8.9*  HCT 27.7* 27.4*  PLT 256  --    Recent Labs    07/27/20 0956  NA 137  K 3.5  CL 104  CO2 27  GLUCOSE 124*  BUN 18  CREATININE 0.78  CALCIUM 8.7*    Intake/Output Summary (Last 24 hours) at 07/30/2020 0835 Last data filed at 07/30/2020 0800 Gross per 24 hour  Intake 780 ml  Output 575 ml  Net 205 ml        Physical Exam: Vital Signs Blood pressure 107/69, pulse (!) 52, temperature 98.1 F (36.7 C), temperature source Oral, resp. rate 16, height 6' (1.829 m), weight 75.8 kg, SpO2 100 %. General: Alert and oriented x 3, No apparent distress HEENT: Head is normocephalic, atraumatic, PERRLA, EOMI, sclera anicteric, oral mucosa pink and moist, dentition intact, ext ear canals clear,  Neck: Supple without JVD or lymphadenopathy Heart: Bradycardia. No murmurs rubs or gallops Chest: CTA bilaterally without wheezes, rales, or rhonchi; no distress Abdomen: Soft, non-tender, non-distended, bowel sounds positive. Extremities: No clubbing, cyanosis, or edema. 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 Functional deficits secondary to Right MCA infarct which require 3+ hours per day of interdisciplinary therapy in a comprehensive inpatient rehab setting.  Physiatrist is  providing close team supervision and 24 hour management of active medical problems listed below.  Physiatrist and rehab team continue to assess barriers to discharge/monitor patient progress toward functional and medical goals  Care Tool:  Bathing    Body parts bathed by patient: Left arm, Chest, Abdomen, Right upper leg, Left upper leg, Right lower leg, Face, Front perineal area, Left lower leg   Body parts bathed by helper: Buttocks, Right arm Body parts n/a: Buttocks   Bathing assist Assist Level: Minimal Assistance - Patient > 75%     Upper Body Dressing/Undressing Upper body dressing   What is the patient wearing?: Pull over shirt    Upper body assist Assist Level: Minimal Assistance - Patient > 75%    Lower Body Dressing/Undressing Lower body dressing      What is the patient wearing?: Incontinence brief, Pants     Lower body assist Assist for lower body dressing: Moderate Assistance - Patient 50 - 74%     Toileting Toileting    Toileting assist Assist for toileting: Moderate Assistance - Patient 50 - 74%     Transfers Chair/bed transfer  Transfers assist     Chair/bed transfer assist level: Minimal Assistance - Patient > 75%     Locomotion Ambulation   Ambulation assist   Ambulation activity did not occur: Safety/medical concerns (required skilled intervention to ambulate 34ft at hallway rail)  Assist level: Moderate Assistance - Patient 50 - 74% Assistive device: Hand held assist Max distance: 50   Walk  10 feet activity   Assist  Walk 10 feet activity did not occur: Safety/medical concerns  Assist level: Moderate Assistance - Patient - 50 - 74% Assistive device: Hand held assist   Walk 50 feet activity   Assist Walk 50 feet with 2 turns activity did not occur: Safety/medical concerns  Assist level: 2 helpers Assistive device: Other (comment) (3 Musketeer)    Walk 150 feet activity   Assist Walk 150 feet activity did not occur:  Safety/medical concerns  Assist level: 2 helpers Assistive device: Other (comment) (3 Musketeer)    Walk 10 feet on uneven surface  activity   Assist Walk 10 feet on uneven surfaces activity did not occur: Safety/medical concerns         Wheelchair     Assist Will patient use wheelchair at discharge?: Yes Type of Wheelchair: Manual    Wheelchair assist level: Supervision/Verbal cueing Max wheelchair distance: 50    Wheelchair 50 feet with 2 turns activity    Assist        Assist Level: Supervision/Verbal cueing   Wheelchair 150 feet activity     Assist      Assist Level: Moderate Assistance - Patient 50 - 74%   Medical Problem List and Plan: 1.  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  Continue CIR PT, OT, SLP  2.  Antithrombotics: -DVT/anticoagulation: Venous Doppler studies negative.  SCDs.  No heparin given hemorrhage.             -antiplatelet therapy: Aspirin 81 mg daily, currently off of Brilinta given GIB and ICA has reoccluded 3. Pain Management: Tylenol as needed.   Complaints of left leg aching, worst at night, started after his stroke. Does not feel like gout. Discussed trialing Gabapentin 300mg  TID and patient and wife are agreeable. Will also help him to sleep better at night. Monitor for sedation during the day.  4. Mood: Provide emotional support             -antipsychotic agents: N/A 5. Neuropsych: This patient is?  Fully capable of making decisions on his own behalf. 6. Skin/Wound Care: Routine skin checks 7. Fluids/Electrolytes/Nutrition: Routine in and outs 8.  Constipation, now with diarrhea.   DC fiber, add probiotic , increase immodium -improved 11/4 9.  Hyperlipidemia.  Lipitor 10.  Gout.  Colchicine twice daily. Held due to diarrhea, most recently had flare up in foot  and RIght elbow   Monitor for any gout flareups  Reasonably controlled , gouty tophi R>L 1st MTP- no evidence of flare up  11.  Orthostasis/bradycardia.  ProAmatine 5 mg every 8 hours as well as Florinef 0.1 mg daily.  Monitor with increased mobility.  Follow-up per cardiology services, Order orthostatic vitals  Vitals:   07/29/20 1926 07/30/20 0518  BP: 98/70 107/69  Pulse: (!) 51 (!) 52  Resp: 15 16  Temp: 97.9 F (36.6 C) 98.1 F (36.7 C)  SpO2: 100% 100%   Overall improved but still with asympt sinus brady HR now in 50-60s  Recurrent orthostasis. Continue midodrine and fludricortisone.  12.  Enterobacter UTI.  Completing course of Bactrim. 13.  Rectal bleeding.  Follow-up GI services.  Currently no plan for endoscopic studies and monitor hemoglobin hematocrit.  Awaiting plan for possible colonoscopy   Hemoglobin 10.4 on 10/20, continue to monitor repeat in am  14.  Spasticity Left  hamstring and Left pectoralis which bothers pt at noc  Baclofen changed to Klonopin 0.25mg  qhs    Botox left pectoralis has reduced tone from MAS 3 to MAS 2, Botox the hamstring has reduced tone MAS 1.  Elbow flexor tone still at MAS 2/3.  Will likely need to repeat as outpatient 16.  Post stroke dysphagia  D2 thins, advance diet as tolerated  LOS: 21 days A FACE TO FACE EVALUATION WAS PERFORMED  Tyler Richards 07/30/2020, 8:35 AM

## 2020-07-30 NOTE — Plan of Care (Signed)
°  Problem: Consults Goal: RH STROKE PATIENT EDUCATION Description: See Patient Education module for education specifics  Outcome: Progressing Goal: Nutrition Consult-if indicated Outcome: Progressing   Problem: RH BOWEL ELIMINATION Goal: RH STG MANAGE BOWEL WITH ASSISTANCE Description: STG Manage Bowel with Assistance. Outcome: Progressing Goal: RH STG MANAGE BOWEL W/MEDICATION W/ASSISTANCE Description: STG Manage Bowel with Medication with Assistance. Outcome: Progressing   Problem: RH BLADDER ELIMINATION Goal: RH STG MANAGE BLADDER WITH ASSISTANCE Description: STG Manage Bladder With Assistance Outcome: Progressing   Problem: RH SKIN INTEGRITY Goal: RH STG SKIN FREE OF INFECTION/BREAKDOWN Outcome: Progressing   Problem: RH SAFETY Goal: RH STG ADHERE TO SAFETY PRECAUTIONS W/ASSISTANCE/DEVICE Description: STG Adhere to Safety Precautions With Assistance/Device. Outcome: Progressing   Problem: RH PAIN MANAGEMENT Goal: RH STG PAIN MANAGED AT OR BELOW [name] PAIN GOAL Outcome: Progressing   Problem: RH Vision Goal: RH LTG Vision (Specify) Outcome: Progressing

## 2020-07-30 NOTE — Progress Notes (Signed)
Patient ID: Tyler Richards, male   DOB: [birth year], 69 y.o.   MRN: [id number]  Patient has been accepted by [facility] for [facility] follow up. Orders have been sent over.  [place], Hector

## 2020-07-30 NOTE — Progress Notes (Signed)
Patient ID: Tyler Richards, male   DOB: Nov 19, 1950, 69 y.o.   MRN: [id number]   Patient set up with PCP at [facility] at [facility] Appointment scheduled with Wilfred Lacy, AGNP-C on Thursday, Decemember 30th at [place], [place]

## 2020-07-30 NOTE — Progress Notes (Signed)
Physical Therapy Session Note  Patient Details  Name: Tyler Richards MRN: [id number] Date of Birth: [birth year]  Today's Date: 07/30/2020 PT Individual Time: 1100-1158 PT Individual Time Calculation (min): 58 min   Short Term Goals: Week 3:  PT Short Term Goal 1 (Week 3): Patient will transfer bed <> wc with MinA consistently PT Short Term Goal 2 (Week 3): Patient will ambulate >52ft with MinA and LRAD, wc follow if needed PT Short Term Goal 3 (Week 3): Patient will maintain static standing balance with CGA >2 mins and LRAD  Skilled Therapeutic Interventions/Progress Updates:    Patient received supine in bed, agreeable to PT. He denies pain, but reports "feeling weak." He does state that he feels as though he's been "confused all day" not knowing which therapy he's in. Patient oriented to schedule and current therapy. BP supine: 104/73 (HR 54). Sitting edge of bed: 95/73 (HR 60). [place] stand pivot to wc leading R (slight pushing/resistance noted with R UE). After transfer: 97/69 (HR 59). Patient completing blocked practice squat pivot transfers wc <> mat. Patient able to verbalize necessary steps prior to transfer: lock brakes, remove armrest. MinA when transferring L, ModA when transferring R due to probably pushing with R UE against arm rest. Patient ultimately progressing to MinA when transferring R or L with intermittent CGA when transferring L. Patient able to complete sit <> stand from mat with ModA and remain standing with CGA to complete dynamic reaching task within his BOS. No LOB or development of leaning noted. Patient completing sit <> stand from mat with ModA-MinA in a blocked practice format to minimize pushing through standing transition. Patient progressing to ultimately MinA for sit <> stand. MinA transfer back to wc via squat pivot. Patient remaining up in wc, seatbelt alarm on, call light within reach, wife at bedside.   Therapy Documentation Precautions:  Precautions Precautions:  Fall, Other (comment) Precaution Comments: left hemiparesis with left hemi inattention; monitor HR and BP, TEDs and abdominal binder when up Restrictions Weight Bearing Restrictions: No    Therapy/Group: Individual Therapy  Karoline Caldwell, PT, DPT, CBIS 07/30/2020, 7:42 AM

## 2020-07-30 NOTE — Progress Notes (Signed)
Speech Language Pathology Weekly Progress and Session Note  Patient Details  Name: Tyler Richards MRN: [id number] Date of Birth: 21-Oct-1950  Beginning of progress report period: July 24, 2020 End of progress report period: July 31, 2020  Today's Date: 07/31/2020 SLP Individual Time: 1000-1055 SLP Individual Time Calculation (min): 55 min  Short Term Goals: Week 3: SLP Short Term Goal 1 (Week 3): Patient will tolerate trial trays and snacks of Dys 3 solids textures with mod I for oral clearance. SLP Short Term Goal 1 - Progress (Week 3): Met SLP Short Term Goal 2 (Week 3): Patient will correct errors in mildly complex problem solving and reasoning tasks with supervision for attention. SLP Short Term Goal 2 - Progress (Week 3): Progressing toward goal SLP Short Term Goal 3 (Week 3): Patient will demonstrate effective recall and use of strategies for improving overall speech production with supervisionA SLP Short Term Goal 3 - Progress (Week 3): Met SLP Short Term Goal 4 (Week 3): Patient will be able to perform complex level problem solving task (medication management, money management) with min assist-supervision for accuracy. SLP Short Term Goal 4 - Progress (Week 3): Progressing toward goal SLP Short Term Goal 5 (Week 3): Patient will demonstrate anticipatory awareness related to his deficits during ADL's and discussion regarding guided discharge needs planning with min A SLP Short Term Goal 5 - Progress (Week 3): Met    New Short Term Goals: Week 4: SLP Short Term Goal 1 (Week 4): Pt will tolerate current diet, demonstrating minimal overt s/sx aspiration and ues of compensatory swallow strategies Mod I. SLP Short Term Goal 2 (Week 4): Patient will be able to perform mildly complex to complex functional tasks with Supervision A for problem solving. SLP Short Term Goal 3 (Week 4): Patient will demonstrate anticipatory awareness related to his deficits during ADL's and discussion  regarding guided discharge needs planning with Supervision A SLP Short Term Goal 4 (Week 4): Pt will detect functional errors with Min A verbal and visual cues.  Weekly Progress Updates: Pt has made functional gains and met 3 out of 5 short term goals. Pt is currently mostly Min assist for basic to mildly complex familiar tasks due to cognitive impairments impacting his left visual scanning, short term recall, problem solving, and error awareness. Pt requires increased Mod A for emergent awareness during more complex tasks, and occasionally for complex problem solving. Pt is using compensatory strategies for mild dysarthria Mod I-Supervision depending on fatigue and whether or not there are other cognitive demands being place on pt while attempting to use speech strategies. Pt is consuming a downgraded Dys 1 (puree) diet with thin liquids with Supervision level cues for use of compensatory strateiges. Pt had been participating in Dys 3 (mechanical soft) texture trials with SLP with swallow function WFL, progressing toward advancement, however pt's preference was to downgrade to Dys 1 (puree) diet due to he and his wife's perception that it would increase his intake.  Pt and family education is ongoing. Pt would continue to benefit from skilled ST while inpatient in order to maximize functional independence and reduce burden of care prior to discharge. Anticipate that pt will need 24/7 supervision at discharge in addition to [place] follow up at next level of care.       Intensity: Minumum of 1-2 x/day, 30 to 90 minutes Frequency: 3 to 5 out of 7 days Duration/Length of Stay: 11/17 Treatment/Interventions: Cognitive remediation/compensation;Cueing hierarchy;Dysphagia/aspiration precaution training;Functional tasks;Patient/family education;Internal/external aids;Speech/Language facilitation  Daily Session  Skilled Therapeutic Interventions: Pt was seen for skilled ST targeting cognitive goals and checking  in regarding current diet textures. Pt continues to express interest in staying on puree textures and his intake has increased, therefore recommend continue current diet. He remains in good spirits with positive affect and motivated to work in therapies.  SLP facilitated session with a semi-complex 4-step action card sequencing task, during which pt required overall Min A verbal and visual cues for error awareness and problem solving, in addition to visual scanning cues to attend to cards on left side of table. Pt also completed a verbal semi-complex time based word problem task. Pt demonstrated verbal problem solving and anticipatory awareness skills with Supervision A verbal cues for accuracy and reasoning. Pt left sitting in chair with alarm set and needs within reach, wife present at bedside. Continue per current plan of care.       Pain Pain Assessment Pain Scale: Faces Pain Score: 0-No pain Faces Pain Scale: No hurt  Therapy/Group: Individual Therapy  Arbutus Leas 07/31/2020, 12:05 PM

## 2020-07-30 NOTE — Progress Notes (Signed)
Patient currently presents with order for [facility] at 30 degrees. Patient educated to orders and orthostatic hypertension. Patient states that the position is just not comfortable and wife is agreeable with patient. 30 degree was unlocked form hil-rom and patient further educated. Will continue to monitor orthostatic blood pressure. Sanda Linger, LPN

## 2020-07-31 ENCOUNTER — Encounter (HOSPITAL_COMMUNITY): Payer: Medicare Other | Admitting: Speech Pathology

## 2020-07-31 ENCOUNTER — Ambulatory Visit (HOSPITAL_COMMUNITY): Payer: Medicare Other

## 2020-07-31 ENCOUNTER — Encounter (HOSPITAL_COMMUNITY): Payer: Medicare Other | Admitting: Occupational Therapy

## 2020-07-31 MED ORDER — MIDODRINE HCL 5 MG PO TABS
10.0000 mg | ORAL_TABLET | Freq: Two times a day (BID) | ORAL | Status: DC
  Administered 2020-07-31 – 2020-08-12 (×24): 10 mg via ORAL
  Filled 2020-07-31 (×24): qty 2

## 2020-07-31 NOTE — Plan of Care (Signed)
°  Problem: Consults Goal: RH STROKE PATIENT EDUCATION Description: See Patient Education module for education specifics  Outcome: Progressing Goal: Nutrition Consult-if indicated Outcome: Progressing   Problem: RH BOWEL ELIMINATION Goal: RH STG MANAGE BOWEL WITH ASSISTANCE Description: STG Manage Bowel with Assistance. Outcome: Progressing Goal: RH STG MANAGE BOWEL W/MEDICATION W/ASSISTANCE Description: STG Manage Bowel with Medication with Assistance. Outcome: Progressing   Problem: RH BLADDER ELIMINATION Goal: RH STG MANAGE BLADDER WITH ASSISTANCE Description: STG Manage Bladder With Assistance Outcome: Progressing   Problem: RH SKIN INTEGRITY Goal: RH STG SKIN FREE OF INFECTION/BREAKDOWN Outcome: Progressing   Problem: RH SAFETY Goal: RH STG ADHERE TO SAFETY PRECAUTIONS W/ASSISTANCE/DEVICE Description: STG Adhere to Safety Precautions With Assistance/Device. Outcome: Progressing   Problem: RH PAIN MANAGEMENT Goal: RH STG PAIN MANAGED AT OR BELOW [name] PAIN GOAL Outcome: Progressing   Problem: RH Vision Goal: RH LTG Vision (Specify) Outcome: Progressing

## 2020-07-31 NOTE — Progress Notes (Signed)
Physical Therapy Session Note  Patient Details  Name: Tyler Richards MRN: [id number] Date of Birth: [birth year]  Today's Date: 07/31/2020 PT Individual Time: 1100-1201 PT Individual Time Calculation (min): 61 min   Short Term Goals: Week 3:  PT Short Term Goal 1 (Week 3): Patient will transfer bed <> wc with MinA consistently PT Short Term Goal 2 (Week 3): Patient will ambulate >49ft with MinA and LRAD, wc follow if needed PT Short Term Goal 3 (Week 3): Patient will maintain static standing balance with CGA >2 mins and LRAD  Skilled Therapeutic Interventions/Progress Updates:    Patient received sitting up in wc, agreeable to PT. Wife at bedside. He denies pain, and states that he's feeling "much more human today." PT propelling patient in wc to therapy gym for time management. Patient and wife states that they will likely be choosing to go home in standard wc vs. K4/K5. Discussed with patient and wife transition to standard chair here in rehab to ensure safe use and positioning prior to dc home. He was able to propel standard wc using B LE 7ft with verbal cues to attend to L LE for increased efficiency. Due to patients L inattention, often L LE will remain planted on ground and patient attempts to roll wc forward using R LE. Patient unable to problem solve solution to this without external cuing. Patient ambulating 63ft with HHA and MinA/ModA. With turns, patient requires ModA and Max verbal cues for appropriate L foot placement, weight shift and attention to task. L lateral lean significantly worse with any 90* turns. Patient ambulating additional 26ft with HHA and MinA/ModA. BP after ambulating: 81/57, after resting 1 min: 91/61, after additional min: 90/63. Patient did have TED hose and abd binder on. He was able to complete 5 sit <> stand with mirror for visual feedback to maintain midline posture and MinA provided. Without mirror for visual feedback, patient resumes L lateral lean and demonstrates  poor proprioceptive input, unable to shift back to midline without external cuing. Patient unsafe to dc with standard wc at this time due to unstable BP and risk for fall out of chair due to syncope. Patient and family wishing to discontinue K4/K5 wc eval at this time and are choosing to dc home with standard wc. Patient and family educated on benefits of custom wc to ensure optimal safety and function. Patient returning to room in standard wc, per patient and family wishes to use that since they will be at home, seatbelt alarm on, call light within reach, wife at bedside.   Therapy Documentation Precautions:  Precautions Precautions: Fall, Other (comment) Precaution Comments: left hemiparesis with left hemi inattention; monitor HR and BP, TEDs and abdominal binder when up Restrictions Weight Bearing Restrictions: No    Therapy/Group: Individual Therapy  Debbora Dus 07/31/2020, 7:42 AM

## 2020-07-31 NOTE — Progress Notes (Signed)
Physical Therapy Weekly Progress Note  Patient Details  Name: Tyler Richards MRN: [id number] Date of Birth: April 11, 1951  Beginning of progress report period: July 24, 2020 End of progress report period: July 31, 2020   Patient has met 1 of 3 short term goals.  Patient is making slow progress toward his goals. He is limited by low blood pressure, episodes of incontinence and self-limiting behaviors at times. He does require encouragement to consistently participate in task despite fatigue. He continues to demonstrate moderate/strong pushing in standing with R LE/UE resulting in L lateral lean. He has progressed to single-person assist for gait, but is limited in distance by fatigue. He does require grossly MinA/ModA at this time. His ELOS was extended by 1 week to allow for greater progress to ensure safety at home with wife to assist with all mobility.   Patient continues to demonstrate the following deficits muscle weakness, decreased cardiorespiratoy endurance, impaired timing and sequencing, abnormal tone, unbalanced muscle activation, decreased coordination and decreased motor planning, decreased visual perceptual skills and decreased visual motor skills, decreased midline orientation, decreased attention to left and decreased motor planning, decreased initiation, decreased attention, decreased awareness, decreased problem solving, decreased safety awareness, decreased memory and delayed processing and decreased sitting balance, decreased standing balance, decreased postural control, hemiplegia and decreased balance strategies and therefore will continue to benefit from skilled PT intervention to increase functional independence with mobility.  Patient progressing toward long term goals..  Continue plan of care.  PT Short Term Goals Week 3:  PT Short Term Goal 1 (Week 3): Patient will transfer bed <> wc with MinA consistently PT Short Term Goal 1 - Progress (Week 3): Progressing toward  goal PT Short Term Goal 2 (Week 3): Patient will ambulate >62ft with MinA and LRAD, wc follow if needed PT Short Term Goal 2 - Progress (Week 3): Progressing toward goal PT Short Term Goal 3 (Week 3): Patient will maintain static standing balance with CGA >2 mins and LRAD PT Short Term Goal 3 - Progress (Week 3): Met Week 4:  PT Short Term Goal 1 (Week 4): Patient will transfer bed <> wc with MinA consistently PT Short Term Goal 2 (Week 4): Patient will ambulate >66ft with MinA and LRAD, wc follow if needed PT Short Term Goal 3 (Week 4): Patient will propel wc >55ft with supervision    Therapy Documentation Precautions:  Precautions Precautions: Fall, Other (comment) Precaution Comments: left hemiparesis with left hemi inattention; monitor HR and BP, TEDs and abdominal binder when up Restrictions Weight Bearing Restrictions: No    Debbora Dus 07/31/2020, 7:56 AM

## 2020-07-31 NOTE — Progress Notes (Addendum)
[place] PHYSICAL MEDICINE & REHABILITATION PROGRESS NOTE   Subjective/Complaints:   Slept well last noc, discussed BP meds ( proamatine and florinef), diarrhea under better control on BID immodium . ROS: Patient denies NVD, CP, SOB  Objective:   No results found. No results for input(s): WBC, HGB, HCT, PLT in the last 72 hours. No results for input(s): NA, K, CL, CO2, GLUCOSE, BUN, CREATININE, CALCIUM in the last 72 hours.  Intake/Output Summary (Last 24 hours) at 07/31/2020 0737 Last data filed at 07/31/2020 0530 Gross per 24 hour  Intake 900 ml  Output 125 ml  Net 775 ml        Physical Exam: Vital Signs Blood pressure 108/63, pulse 66, temperature 97.9 F (36.6 C), temperature source Oral, resp. rate 16, height 6' (1.829 m), weight 75.2 kg, SpO2 99 %.  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 Functional deficits secondary to Right MCA infarct which require 3+ hours per day of interdisciplinary therapy in a comprehensive inpatient rehab setting.  Physiatrist is providing close team supervision and 24 hour management of active medical problems listed below.  Physiatrist and rehab team continue to assess barriers to discharge/monitor patient progress toward functional and medical goals  Care Tool:  Bathing    Body parts bathed by patient: Left arm, Chest, Abdomen, Right  upper leg, Left upper leg, Right lower leg, Face, Front perineal area, Left lower leg   Body parts bathed by helper: Buttocks, Right arm Body parts n/a: Buttocks   Bathing assist Assist Level: Minimal Assistance - Patient > 75%     Upper Body Dressing/Undressing Upper body dressing   What is the patient wearing?: Pull over shirt    Upper body assist Assist Level: Minimal Assistance - Patient > 75%    Lower Body Dressing/Undressing Lower body dressing      What is the patient wearing?: Incontinence brief, Pants     Lower body assist Assist for lower body dressing: Moderate Assistance - Patient 50 - 74%     Toileting Toileting    Toileting assist Assist for toileting: Moderate Assistance - Patient 50 - 74%     Transfers Chair/bed transfer  Transfers assist     Chair/bed transfer assist level: Moderate Assistance - Patient 50 - 74%     Locomotion Ambulation   Ambulation assist   Ambulation activity did not occur: Safety/medical concerns (required skilled intervention to ambulate 40ft at hallway rail)  Assist level: Moderate Assistance - Patient 50 - 74% Assistive device: Hand held assist Max distance: 50   Walk 10 feet activity   Assist  Walk 10 feet activity did not occur: Safety/medical concerns  Assist level: Moderate Assistance - Patient - 50 - 74% Assistive device: Hand held assist   Walk 50 feet activity   Assist Walk 50 feet with 2 turns activity did not occur: Safety/medical concerns  Assist level: 2 helpers Assistive device: Other (comment) (3 Musketeer)  Walk 150 feet activity   Assist Walk 150 feet activity did not occur: Safety/medical concerns  Assist level: 2 helpers Assistive device: Other (comment) (3 Musketeer)    Walk 10 feet on uneven surface  activity   Assist Walk 10 feet on uneven surfaces activity did not occur: Safety/medical concerns         Wheelchair     Assist Will patient use wheelchair at  discharge?: Yes Type of Wheelchair: Manual    Wheelchair assist level: Supervision/Verbal cueing Max wheelchair distance: 50    Wheelchair 50 feet with 2 turns activity    Assist        Assist Level: Supervision/Verbal cueing   Wheelchair 150 feet activity     Assist      Assist Level: Moderate Assistance - Patient 50 - 74%   Medical Problem List and Plan: 1.  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  Continue CIR PT, OT, SLP  2.  Antithrombotics: -DVT/anticoagulation: Venous Doppler studies negative.  SCDs.  No heparin given hemorrhage.             -antiplatelet therapy: Aspirin 81 mg daily, currently off of Brilinta given GIB and ICA has reoccluded 3. Pain Management: Tylenol as needed.   Complaints of left leg aching, worst at night, started after his stroke. Does not feel like gout. Discussed trialing Gabapentin 300mg  TID and patient and wife are agreeable. Will also help him to sleep better at night. Monitor for sedation during the day.  4. Mood: Provide emotional support             -antipsychotic agents: N/A 5. Neuropsych: This patient is?  Fully capable of making decisions on his own behalf. 6. Skin/Wound Care: Routine skin checks 7. Fluids/Electrolytes/Nutrition: Routine in and outs 8.  Constipation, now with diarrhea.   DC fiber, add probiotic , increase immodium -improved 11/4 9.  Hyperlipidemia.  Lipitor 10.  Gout.  Colchicine twice daily. Held due to diarrhea, most recently had flare up in foot and RIght elbow   Monitor for any gout flareups  Reasonably controlled , gouty tophi R>L 1st MTP- no evidence of flare up  11.  Orthostasis/bradycardia.  ProAmatine 5 mg every 8 hours as well as Florinef 0.1 mg daily.  Monitor with increased mobility.  Follow-up per cardiology services, Order orthostatic  vitals  Vitals:   07/30/20 1955 07/31/20 0531  BP: 106/62 108/63  Pulse: (!) 54 66  Resp: 17 16  Temp: 99.5 F (37.5 C) 97.9 F (36.6 C)  SpO2: 100% 99%   Overall improved but still with asympt sinus brady HR now in 50-60s  Recurrent orthostasis. Continue midodrine and fludricortisone, changed midorine to 10mg  annd dose 7a and 2p. If cont to be issue will need to increase florinef  12.  Enterobacter UTI.  Completing course of Bactrim. 13.  Rectal bleeding.  Follow-up GI services.  Currently no plan for endoscopic studies and monitor hemoglobin hematocrit.   colonoscopy  As OP  Hemoglobin 8.9 on 11/2  14.  Spasticity Left hamstring and Left pectoralis which bothers pt at noc  Baclofen changed to Klonopin 0.25mg  qhs    Botox left pectoralis has reduced tone from MAS 3 to MAS 2, Botox the hamstring has reduced tone MAS 1.  Elbow flexor tone still at MAS 2/3.  Will likely need to repeat as outpatient 16.  Post stroke dysphagia  D2 thins, advance diet as tolerated  LOS: 22 days A FACE TO FACE EVALUATION WAS PERFORMED  Charlett Blake 07/31/2020, 7:37 AM

## 2020-07-31 NOTE — Progress Notes (Signed)
Occupational Therapy Session Note  Patient Details  Name: Tyler Richards MRN: [id number] Date of Birth: January 09, 1951  Today's Date: 07/31/2020 OT Individual Time: 0910-1007 OT Individual Time Calculation (min): 57 min    Short Term Goals: Week 4:  OT Short Term Goal 1 (Week 4): Pt will donn a pullover shirt with min guard assist following hemi techniques. OT Short Term Goal 2 (Week 4): Pt will complete toilet transfer with min assist stand pivot. OT Short Term Goal 3 (Week 4): Pt will use the LUE at a gross assist level with min facilitation for holding items to be opened with selfcare tasks. OT Short Term Goal 4 (Week 4): Pt will complete LB dressing at min assist level sit to stand except for donning TEDs.  Skilled Therapeutic Interventions/Progress Updates:    Pt in bed to start session with BP at 95/55.  TEDs donned and he transferred to sitting with max assist.  Therapist assisted with donning the abdominal binder and BP was taken again at 110/62.  Worked on donning pants and shoes EOB.  Mod assist for donning pull up pants with max assist for donning shoes.  He then completed mod assist pivot transfer to the wheelchair.  Took him down to the therapy gym and transferred to the therapy mat with mod assist.  He then transitioned to supine with focus on left shoulder stretches to the pectoral and elbow flexors.  He was able to achieve shoulder PROM flexion and abduction to approximately 150 degrees.  Positioned arm in approximately 90 degrees of shoulder flexion and had pt work on elbow extension with mod assist.  He then transitioned to sitting and therapist applied NMES to the digit extensors of the left hand for 10 mins.  Intensity at level 22 with on time/off time 10 seconds.  Pulse width was at 300 and PPS at 35.  Pt was instructed to assist with functional reaching to target when stimulation was active.  He needed mod assist for shoulder flexion and elbow extension.  No adverse reactions noted  after stimulation.  Finished session with transfer to the wheelchair at [facility] assist and return to the room.  He was left up in the tilt in space wheelchair with the call button and phone in reach.  SLP in the room for next session.    Therapy Documentation Precautions:  Precautions Precautions: Fall, Other (comment) Precaution Comments: left hemiparesis with left hemi inattention; monitor HR and BP, TEDs and abdominal binder when up Restrictions Weight Bearing Restrictions: No   Pain: Pain Assessment Pain Scale: Faces Pain Score: 0-No pain Faces Pain Scale: No hurt ADL: See Care Tool Section for some details of mobility and selfcare  Therapy/Group: Individual Therapy  Shantoria Ellwood OTR/L 07/31/2020, 12:43 PM

## 2020-08-01 ENCOUNTER — Inpatient Hospital Stay (HOSPITAL_COMMUNITY): Payer: Medicare Other | Admitting: Occupational Therapy

## 2020-08-01 ENCOUNTER — Encounter (HOSPITAL_COMMUNITY): Payer: Medicare Other

## 2020-08-01 ENCOUNTER — Encounter (HOSPITAL_COMMUNITY): Payer: Medicare Other | Admitting: Occupational Therapy

## 2020-08-01 ENCOUNTER — Ambulatory Visit (HOSPITAL_COMMUNITY): Payer: Medicare Other | Admitting: Physical Therapy

## 2020-08-01 MED ORDER — FLUDROCORTISONE ACETATE 0.1 MG PO TABS: 0.1500 mg | ORAL_TABLET | Freq: Two times a day (BID) | ORAL | Status: DC

## 2020-08-01 MED ORDER — FLUDROCORTISONE ACETATE 0.1 MG PO TABS
0.1500 mg | ORAL_TABLET | Freq: Two times a day (BID) | ORAL | Status: DC
  Administered 2020-08-01 – 2020-08-12 (×22): 0.15 mg via ORAL
  Filled 2020-08-01 (×23): qty 1.5

## 2020-08-01 NOTE — Progress Notes (Signed)
[place] PHYSICAL MEDICINE & REHABILITATION PROGRESS NOTE   Subjective/Complaints: No complaints this morning. Denies pain, insomnia.  Felling better- this morning was having a hard time having a BM but was able to have a good one. Still wants Imodium as it has helped greatly with diarrhea.  ROS: Patient denies NVD, CP, SOB  Objective:   No results found. No results for input(s): WBC, HGB, HCT, PLT in the last 72 hours. No results for input(s): NA, K, CL, CO2, GLUCOSE, BUN, CREATININE, CALCIUM in the last 72 hours.  Intake/Output Summary (Last 24 hours) at 08/01/2020 1419 Last data filed at 08/01/2020 1300 Gross per 24 hour  Intake 720 ml  Output 500 ml  Net 220 ml        Physical Exam: Vital Signs Blood pressure 110/60, pulse 72, temperature 98.8 F (37.1 C), resp. rate 16, height 6' (1.829 m), weight 75.6 kg, SpO2 98 %. General: Alert and oriented x 3, No apparent distress HEENT: Head is normocephalic, atraumatic, PERRLA, EOMI, sclera anicteric, oral mucosa pink and moist, dentition intact, ext ear canals clear,  Neck: Supple without JVD or lymphadenopathy Heart: Reg rate and rhythm. No murmurs rubs or gallops Chest: CTA bilaterally without wheezes, rales, or rhonchi; no distress Abdomen: Soft, non-tender, non-distended, bowel sounds positive.  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 Functional deficits secondary to Right MCA infarct which require 3+ hours per day of interdisciplinary therapy in a comprehensive inpatient rehab setting.  Physiatrist is providing close team supervision and 24 hour management of active medical problems listed below.  Physiatrist and rehab team continue to assess barriers to discharge/monitor patient  progress toward functional and medical goals  Care Tool:  Bathing    Body parts bathed by patient: Left arm, Chest, Abdomen, Right upper leg, Left upper leg, Right lower leg, Face, Front perineal area, Left lower leg   Body parts bathed by helper: Buttocks, Right arm Body parts n/a: Buttocks   Bathing assist Assist Level: Minimal Assistance - Patient > 75%     Upper Body Dressing/Undressing Upper body dressing   What is the patient wearing?: Pull over shirt    Upper body assist Assist Level: Minimal Assistance - Patient > 75%    Lower Body Dressing/Undressing Lower body dressing      What is the patient wearing?: Pants     Lower body assist Assist for lower body dressing: Moderate Assistance - Patient 50 - 74%     Toileting Toileting    Toileting assist Assist for toileting: Moderate Assistance - Patient 50 - 74%     Transfers Chair/bed transfer  Transfers assist     Chair/bed transfer assist level: Moderate Assistance - Patient 50 - 74%     Locomotion Ambulation   Ambulation assist   Ambulation activity did not occur: Safety/medical concerns (required skilled intervention to ambulate 65ft at hallway rail)  Assist level: Moderate Assistance - Patient 50 - 74% Assistive device: Hand held assist Max distance: 26   Walk 10 feet activity   Assist  Walk 10 feet activity did not occur: Safety/medical concerns  Assist level: Minimal Assistance - Patient > 75% Assistive device: Hand held assist   Walk 50 feet activity   Assist Walk 50 feet with 2 turns activity did  not occur: Safety/medical concerns  Assist level: 2 helpers Assistive device: Other (comment) (3 Musketeer)    Walk 150 feet activity   Assist Walk 150 feet activity did not occur: Safety/medical concerns  Assist level: 2 helpers Assistive device: Other (comment) (3 Musketeer)    Walk 10 feet on uneven surface  activity   Assist Walk 10 feet on uneven surfaces activity did not  occur: Safety/medical concerns         Wheelchair     Assist Will patient use wheelchair at discharge?: Yes Type of Wheelchair: Manual    Wheelchair assist level: Supervision/Verbal cueing Max wheelchair distance: 50    Wheelchair 50 feet with 2 turns activity    Assist        Assist Level: Supervision/Verbal cueing   Wheelchair 150 feet activity     Assist      Assist Level: Moderate Assistance - Patient 50 - 74%   Medical Problem List and Plan: 1.  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  Continue CIR PT, OT, SLP  2.  Antithrombotics: -DVT/anticoagulation: Venous Doppler studies negative.  SCDs.  No heparin given hemorrhage.             -antiplatelet therapy: Aspirin 81 mg daily, currently off of Brilinta given GIB and ICA has reoccluded 3. Pain Management: Tylenol as needed.   Complaints of left leg aching, worst at night, started after his stroke. Does not feel like gout. Discussed trialing Gabapentin 300mg  TID and patient and wife are agreeable. Will also help him to sleep better at night. Monitor for sedation during the day.   11/6: Gabapentin has helped him sleep better at night.  4. Mood: Provide emotional support             -antipsychotic agents: N/A 5. Neuropsych: This patient is?  Fully capable of making decisions on his own behalf. 6. Skin/Wound Care: Routine skin checks 7. Fluids/Electrolytes/Nutrition: Routine in and outs 8.  Constipation, now with diarrhea.   DC fiber, add probiotic , increase immodium -improved 11/4  11/6: patient was constipated this morning but able to have BM. Discussed that this could be due to imodium. Wife prefers to continue at this time as has been greatly helping with diarrhea.  9.  Hyperlipidemia.  Lipitor 10.  Gout.  Colchicine twice daily. Held due to  diarrhea, most recently had flare up in foot and RIght elbow   Monitor for any gout flareups  Reasonably controlled , gouty tophi R>L 1st MTP- no evidence of flare up  11.  Orthostasis/bradycardia.  ProAmatine 5 mg every 8 hours as well as Florinef 0.1 mg daily.  Monitor with increased mobility.  Follow-up per cardiology services, Order orthostatic vitals  Vitals:   07/31/20 1953 08/01/20 0303  BP: (!) 95/59 110/60  Pulse: 62 72  Resp: 16 16  Temp: 98.3 F (36.8 C) 98.8 F (37.1 C)  SpO2: 98% 98%   Overall improved but still with asympt sinus brady HR now in 50-60s  Recurrent orthostasis. Continue midodrine and fludricortisone, changed midorine to 10mg  annd dose 7a and 2p. If cont to be issue will need to increase florinef   11/6: improved, but BP still soft and with dizziness with therapy. Increase florinef to 0.15BID at 0800 and 12pm.  12.  Enterobacter UTI.  Completing course of Bactrim. 13.  Rectal bleeding.  Follow-up  GI services.  Currently no plan for endoscopic studies and monitor hemoglobin hematocrit.   colonoscopy  As OP  Hemoglobin 8.9 on 11/2  14.  Spasticity Left hamstring and Left pectoralis which bothers pt at noc  Baclofen changed to Klonopin 0.25mg  qhs    Botox left pectoralis has reduced tone from MAS 3 to MAS 2, Botox the hamstring has reduced tone MAS 1.  Elbow flexor tone still at MAS 2/3.  Will likely need to repeat as outpatient 16.  Post stroke dysphagia  D2 thins, advance diet as tolerated  LOS: 23 days A FACE TO FACE EVALUATION WAS PERFORMED  Martha Clan P Makaelah Cranfield 08/01/2020, 2:19 PM

## 2020-08-01 NOTE — Progress Notes (Signed)
Physical Therapy Session Note  Patient Details  Name: Tyler Richards MRN: [id number] Date of Birth: 09/08/1951  Today's Date: 08/01/2020 PT Individual Time: 0907-1002 PT Individual Time Calculation (min): 55 min   Short Term Goals: Week 4:  PT Short Term Goal 1 (Week 4): Patient will transfer bed <> wc with MinA consistently PT Short Term Goal 2 (Week 4): Patient will ambulate >54ft with MinA and LRAD, wc follow if needed PT Short Term Goal 3 (Week 4): Patient will propel wc >29ft with supervision  Skilled Therapeutic Interventions/Progress Updates:    Pt received supine in bed with with wife sitting by bedside and pt immediately stating "I feel terrible.Marland KitchenMarland KitchenI feel awful" and "I haven't had a day this bad since I've been here." Pt states "I feel like I'm ready to blow up" stating he hasn't been able to have a BM or void bladder since this AM. Therapist notified Christena Flake, RN and Gracie, NT of pt's symptoms. NT present to perform bladder scan (<173mL) with pt then stating he feels that it is more his bowels than his bladder. Encouraged pt to get OOB onto [facility] for improved ability to void. Donned pants, B LE thigh high TEDS, and abdominal binder while in supine with pt rolling R/L using bedrails with min assist. Assessed supine vitals: BP 99/60 (MAP 72), HR 61bpm  Supine>sitting R EOB, HOB elevated and using bedrail, with min assist for trunk upright and rotating pelvis. Sitting vitals: BP 91/64 (MAP 73), HR 77bpm, no symptoms. Christena Flake, RN present to assess pt and aware of therapist plan to assist pt to [facility]. L squat pivot EOB>drop arm BSC with mod assist for lifting/pivoting hips and cuing for head/hips relationship. Sit<>stands to/from [facility] using R UE support on bedrail with min assist for balance and with cuing pt able to maintain midline while therapist performed total assist LB clothing management. Continent of BM - pt noted to have formed stool and was very excited to no longer have diarrhea - pt immediately  states "Lord, I feel so much better" now that he has voided. Therapist extensively educated pt/wife that when he feels urge to have BM he needs to ask nursing staff for assist to [facility] to improve pelvic alignment to allow easier passing of BM (as opposed to using bedpan) especially now that he is having formed stools. Assessed vitals while on BSC: BP 115/56 (MAP 70), HR 89bpm. R squat pivot BSC>EOB>w/c with mod assist for lifting/pivoting hips and pt demoing some increased pushing with R UE. Reassessed vitals: BP 105/62 (MAP 76), HR 80bpm  Transported to/from gym in w/c for time management and energy conservation. Gait training 62ft with mod assist of 1 on pt's L side for balance as pt does not fully weight shift R during stance preventing him from being able to advance L LE during swing - required cuing and manual facilitation for improvement - demos wide BOS throughout with decreased B LE step length. Assessed vitals after gait: BP 96/58 (MAP 68), HR 105bpm L squat pivot EOM>w/c with min assist for lifting/pivoting hips, continued cuing for head/hips relationship. Transported back to room and pt hand-off to [place], [place].  Therapy Documentation Precautions:  Precautions Precautions: Fall, Other (comment) Precaution Comments: left hemiparesis with left hemi inattention; monitor HR and BP, TEDs and abdominal binder when up Restrictions Weight Bearing Restrictions: No  Pain:   Reports abdominal pain - assisted to [facility] with continent void of BM and pt reports pain relief. Otherwise, no complaints of pain.  Therapy/Group: Individual Therapy  Tawana Scale , PT, DPT, CSRS  08/01/2020, 7:59 AM

## 2020-08-01 NOTE — Progress Notes (Signed)
Occupational Therapy Session Note  Patient Details  Name: Tyler Richards MRN: [id number] Date of Birth: [birth year]  Today's Date: 08/01/2020 OT Individual Time: 1000-1100 OT Individual Time Calculation (min): 60 min    Short Term Goals: Week 3:  OT Short Term Goal 1 (Week 3): Pt will donn a pullover shirt with min guard assist following hemi techniques. OT Short Term Goal 1 - Progress (Week 3): Not met OT Short Term Goal 2 (Week 3): Pt will complete toilet transfer with min assist stand pivot. OT Short Term Goal 2 - Progress (Week 3): Not met OT Short Term Goal 3 (Week 3): Pt will use the LUE at a gross assist level with min facilitation for holding items to be opened with selfcare tasks. OT Short Term Goal 3 - Progress (Week 3): Not met OT Short Term Goal 4 (Week 3): Pt will complete LB dressing at min assist level sit to stand except for donning TEDs. OT Short Term Goal 4 - Progress (Week 3): Not met  Skilled Therapeutic Interventions/Progress Updates:    1:1 Focus on NMR including squat pivot transfers, sit to stands, functional use of left UE.  Transfers: practiced from mat <> w/c with proper hand and feet placement with mod cues and then min A transfer with extra time and cues. At EOB engaged in sit to stands with mirror for visual feedback and control of left knee and sustaining mm activity in standing. Transitioned to standing with right LE propped up on 1 inch block for more forced use of left LE. With repetitions pt c/o fatigue. BP taken and was 88/57. Switched to focus on left UE.   Pt transitioned from sitting to sidelying (on right side ) with min A. In sidelying and in supine focus on PNF - D1/D2 in left UE with min to mod A against gravity. Pt with more proximal movement then distal. Also focus on shoulder abduction for a stretch on pecs and bicep. Also performed stretching of hamstrings in supine. Also performed controlled left hip adduction with bridging with controlled  movement.  Left sitting up in the room with wife present.     Therapy Documentation Precautions:  Precautions Precautions: Fall, Other (comment) Precaution Comments: left hemiparesis with left hemi inattention; monitor HR and BP, TEDs and abdominal binder when up Restrictions Weight Bearing Restrictions: No General:   Vital Signs:   Pain: Pain Assessment Pain Score: 0-No pain ADL: ADL Eating: Supervision/safety Where Assessed-Eating: Bed level Grooming: Supervision/safety Where Assessed-Grooming: Bed level Upper Body Bathing: Minimal assistance Where Assessed-Upper Body Bathing: Other (Comment) (3:1) Lower Body Bathing: Dependent Where Assessed-Lower Body Bathing: Edge of bed Upper Body Dressing: Maximal assistance Where Assessed-Upper Body Dressing: Edge of bed Lower Body Dressing: Dependent Where Assessed-Lower Body Dressing: Edge of bed Toileting: Dependent Where Assessed-Toileting: Bedside Commode Toilet Transfer: Dependent Toilet Transfer Method: Other (comment) Customer service manager) Toilet Transfer Equipment: Radiographer, therapeutic: Not assessed Social research officer, government: Not assessed Vision   Perception    Praxis   Exercises:   Other Treatments:     Therapy/Group: Individual Therapy  Willeen Cass [facility] 08/01/2020, 12:39 PM

## 2020-08-01 NOTE — Progress Notes (Signed)
Speech Language Pathology Daily Session Note  Patient Details  Name: Tyler Richards MRN: [id number] Date of Birth: Oct 04, 1950  Today's Date: 08/01/2020 SLP Individual Time: 1105-1200 SLP Individual Time Calculation (min): 55 min  Short Term Goals: Week 4: SLP Short Term Goal 1 (Week 4): Pt will tolerate current diet, demonstrating minimal overt s/sx aspiration and ues of compensatory swallow strategies Mod I. SLP Short Term Goal 2 (Week 4): Patient will be able to perform mildly complex to complex functional tasks with Supervision A for problem solving. SLP Short Term Goal 3 (Week 4): Patient will demonstrate anticipatory awareness related to his deficits during ADL's and discussion regarding guided discharge needs planning with Supervision A SLP Short Term Goal 4 (Week 4): Pt will detect functional errors with Min A verbal and visual cues.  Skilled Therapeutic Interventions:Skilled ST services focused on cognitive skills. Pt demonstrated recall of general PT strategies pertaining to ambulation, however required min A verbal cues to sequence specific steps. SLP facilitated mildly complex problem solving and error awareness skills in familiar PEG design task. Pt demonstrated ability to problem solving placement of PEG with min A fade to supervision A verbal cues, but required mod A verbal cues to visualize pattern (big picture) which appeared to lead to majority of the errors. Pt required mod A fade to min A verbal cues to recognize and correct errors. Education was provided to think tasks as big picture/general idea (ex: transferring from sitting to standing) then list specific steps in order. All questions answered to satisfaction. Pt was left in room with wife, call bell within reach and chair alarm set. SLP recommends to continue skilled services.     Pain Pain Assessment Pain Scale: 0-10 Pain Score: 0-No pain  Therapy/Group: Individual Therapy  Palin Tristan  [facility] 08/01/2020, 12:08 PM

## 2020-08-02 MED ORDER — LOPERAMIDE HCL 2 MG PO CAPS: 4.0000 mg | ORAL_CAPSULE | ORAL | Status: DC | PRN

## 2020-08-02 NOTE — Progress Notes (Signed)
[place] PHYSICAL MEDICINE & REHABILITATION PROGRESS NOTE   Subjective/Complaints: C/o constipation: changed imodium to prn BP soft this morning despite increase in Florinef. Did not have therapy today.  Bradycardic  ROS: Patient denies NVD, CP, SOB  Objective:   No results found. No results for input(s): WBC, HGB, HCT, PLT in the last 72 hours. No results for input(s): NA, K, CL, CO2, GLUCOSE, BUN, CREATININE, CALCIUM in the last 72 hours.  Intake/Output Summary (Last 24 hours) at 08/02/2020 1452 Last data filed at 08/02/2020 1330 Gross per 24 hour  Intake 360 ml  Output 1100 ml  Net -740 ml        Physical Exam: Vital Signs Blood pressure (!) 96/56, pulse (!) 54, temperature 98.2 F (36.8 C), resp. rate 18, height 6' (1.829 m), weight 76.2 kg, SpO2 90 %.  General: Alert and oriented x 3, No apparent distress HEENT: Head is normocephalic, atraumatic, PERRLA, EOMI, sclera anicteric, oral mucosa pink and moist, dentition intact, ext ear canals clear,  Neck: Supple without JVD or lymphadenopathy Heart: Reg rate and rhythm. No murmurs rubs or gallops Chest: CTA bilaterally without wheezes, rales, or rhonchi; no distress Abdomen: Soft, non-tender, non-distended, bowel sounds positive. Extremities: No clubbing, cyanosis, or edema. 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 Functional deficits secondary to Right MCA infarct which require 3+ hours per day of interdisciplinary therapy in a comprehensive inpatient rehab setting.  Physiatrist is providing close team supervision and 24 hour management of active medical problems listed below.  Physiatrist and rehab team continue to  assess barriers to discharge/monitor patient progress toward functional and medical goals  Care Tool:  Bathing    Body parts bathed by patient: Left arm, Chest, Abdomen, Right upper leg, Left upper leg, Right lower leg, Face, Front perineal area, Left lower leg   Body parts bathed by helper: Buttocks, Right arm Body parts n/a: Buttocks   Bathing assist Assist Level: Minimal Assistance - Patient > 75%     Upper Body Dressing/Undressing Upper body dressing   What is the patient wearing?: Pull over shirt    Upper body assist Assist Level: Minimal Assistance - Patient > 75%    Lower Body Dressing/Undressing Lower body dressing      What is the patient wearing?: Pants     Lower body assist Assist for lower body dressing: Moderate Assistance - Patient 50 - 74%     Toileting Toileting    Toileting assist Assist for toileting: Moderate Assistance - Patient 50 - 74%     Transfers Chair/bed transfer  Transfers assist     Chair/bed transfer assist level: Moderate Assistance - Patient 50 - 74%     Locomotion Ambulation   Ambulation assist   Ambulation activity did not occur: Safety/medical concerns (required skilled intervention to ambulate 19ft at hallway rail)  Assist level: Moderate Assistance - Patient 50 - 74% Assistive device: Hand held assist Max distance: 45ft   Walk 10 feet activity   Assist  Walk 10 feet activity did not occur: Safety/medical concerns  Assist level: Moderate Assistance - Patient - 50 - 74% Assistive device: Hand held assist   Walk 50 feet activity   Assist Walk 50 feet  with 2 turns activity did not occur: Safety/medical concerns  Assist level: Moderate Assistance - Patient - 50 - 74% Assistive device: Hand held assist    Walk 150 feet activity   Assist Walk 150 feet activity did not occur: Safety/medical concerns  Assist level: 2 helpers Assistive device: Other (comment) (3 Musketeer)    Walk 10 feet on uneven surface   activity   Assist Walk 10 feet on uneven surfaces activity did not occur: Safety/medical concerns         Wheelchair     Assist Will patient use wheelchair at discharge?: Yes Type of Wheelchair: Manual    Wheelchair assist level: Supervision/Verbal cueing Max wheelchair distance: 50    Wheelchair 50 feet with 2 turns activity    Assist        Assist Level: Supervision/Verbal cueing   Wheelchair 150 feet activity     Assist      Assist Level: Moderate Assistance - Patient 50 - 74%   Medical Problem List and Plan: 1.  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  Continue CIR PT, OT, SLP  2.  Antithrombotics: -DVT/anticoagulation: Venous Doppler studies negative.  SCDs.  No heparin given hemorrhage.             -antiplatelet therapy: Aspirin 81 mg daily, currently off of Brilinta given GIB and ICA has reoccluded 3. Pain Management: Tylenol as needed.   Complaints of left leg aching, worst at night, started after his stroke. Does not feel like gout. Discussed trialing Gabapentin 300mg  TID and patient and wife are agreeable. Will also help him to sleep better at night. Monitor for sedation during the day.   11/6: Gabapentin has helped him sleep better at night.  11/7: well controlled  4. Mood: Provide emotional support             -antipsychotic agents: N/A 5. Neuropsych: This patient is?  Fully capable of making decisions on his own behalf. 6. Skin/Wound Care: Routine skin checks 7. Fluids/Electrolytes/Nutrition: Routine in and outs 8.  Constipation, now with diarrhea.   DC fiber, add probiotic , increase immodium -improved 11/4  11/6: patient was constipated this morning but able to have BM. Discussed that this could be due to imodium. Wife prefers to continue at this time as has been greatly helping  with diarrhea.   11/7: constipation feels uncomfortably for patient: change imodium to PRN 9.  Hyperlipidemia.  Lipitor 10.  Gout.  Colchicine twice daily. Held due to diarrhea, most recently had flare up in foot and RIght elbow   Monitor for any gout flareups  Reasonably controlled , gouty tophi R>L 1st MTP-  No evidence of flare-up 11.  Orthostasis/bradycardia.  ProAmatine 5 mg every 8 hours as well as Florinef 0.1 mg daily.  Monitor with increased mobility.  Follow-up per cardiology services, Order orthostatic vitals  Vitals:   08/02/20 0331 08/02/20 1351  BP: 120/69 (!) 96/56  Pulse: 62 (!) 54  Resp: 17 18  Temp: 99 F (37.2 C) 98.2 F (36.8 C)  SpO2: 99% 90%   Overall improved but still with asympt sinus brady HR now in 50-60s  Recurrent orthostasis. Continue midodrine and fludricortisone, changed midorine to 10mg  annd dose 7a and 2p. If cont to be issue will need to increase florinef   11/6: improved, but BP still soft and with dizziness with  therapy. Increase florinef to 0.15BID at 0800 and 12pm.  12.  Enterobacter UTI.  Completing course of Bactrim. 13.  Rectal bleeding.  Follow-up GI services.  Currently no plan for endoscopic studies and monitor hemoglobin hematocrit.   colonoscopy  As OP  Hemoglobin 8.9 on 11/2  14.  Spasticity Left hamstring and Left pectoralis which bothers pt at noc  Baclofen changed to Klonopin 0.25mg  qhs    Botox left pectoralis has reduced tone from MAS 3 to MAS 2, Botox the hamstring has reduced tone MAS 1.  Elbow flexor tone still at MAS 2/3.  Will likely need to repeat as outpatient 16.  Post stroke dysphagia  D2 thins, advance diet as tolerated  LOS: 24 days A FACE TO FACE EVALUATION WAS PERFORMED  Martha Clan P Thelbert Gartin 08/02/2020, 2:52 PM

## 2020-08-02 NOTE — Plan of Care (Signed)
  Problem: Consults Goal: RH STROKE PATIENT EDUCATION Description: See Patient Education module for education specifics  Outcome: Progressing Goal: Nutrition Consult-if indicated Outcome: Progressing   Problem: RH BOWEL ELIMINATION Goal: RH STG MANAGE BOWEL WITH ASSISTANCE Description: STG Manage Bowel with Assistance. Outcome: Progressing Goal: RH STG MANAGE BOWEL W/MEDICATION W/ASSISTANCE Description: STG Manage Bowel with Medication with Assistance. Outcome: Progressing   Problem: RH BLADDER ELIMINATION Goal: RH STG MANAGE BLADDER WITH ASSISTANCE Description: STG Manage Bladder With Assistance Outcome: Progressing   Problem: RH SKIN INTEGRITY Goal: RH STG SKIN FREE OF INFECTION/BREAKDOWN Outcome: Progressing   Problem: RH SAFETY Goal: RH STG ADHERE TO SAFETY PRECAUTIONS W/ASSISTANCE/DEVICE Description: STG Adhere to Safety Precautions With Assistance/Device. Outcome: Progressing   Problem: RH PAIN MANAGEMENT Goal: RH STG PAIN MANAGED AT OR BELOW [name] PAIN GOAL Outcome: Progressing   Problem: RH Vision Goal: RH LTG Vision (Specify) Outcome: Progressing

## 2020-08-03 ENCOUNTER — Inpatient Hospital Stay (HOSPITAL_COMMUNITY): Payer: Medicare Other | Admitting: Speech Pathology

## 2020-08-03 ENCOUNTER — Inpatient Hospital Stay (HOSPITAL_COMMUNITY): Payer: Medicare Other | Admitting: Occupational Therapy

## 2020-08-03 ENCOUNTER — Inpatient Hospital Stay (HOSPITAL_COMMUNITY): Payer: Medicare Other

## 2020-08-03 MED ORDER — CLONAZEPAM 0.25 MG PO TBDP
0.2500 mg | ORAL_TABLET | Freq: Two times a day (BID) | ORAL | Status: DC | PRN
  Administered 2020-08-03 – 2020-08-09 (×5): 0.25 mg via ORAL
  Filled 2020-08-03 (×6): qty 1

## 2020-08-03 NOTE — Progress Notes (Signed)
Speech Language Pathology Daily Session Note  Patient Details  Name: Tyler Richards MRN: [id number] Date of Birth: Feb 23, 1951  Today's Date: 08/03/2020 SLP Individual Time: 7915-0569 SLP Individual Time Calculation (min): 55 min  Short Term Goals: Week 4: SLP Short Term Goal 1 (Week 4): Pt will tolerate current diet, demonstrating minimal overt s/sx aspiration and ues of compensatory swallow strategies Mod I. SLP Short Term Goal 2 (Week 4): Patient will be able to perform mildly complex to complex functional tasks with Supervision A for problem solving. SLP Short Term Goal 3 (Week 4): Patient will demonstrate anticipatory awareness related to his deficits during ADL's and discussion regarding guided discharge needs planning with Supervision A SLP Short Term Goal 4 (Week 4): Pt will detect functional errors with Min A verbal and visual cues.  Skilled Therapeutic Interventions: Pt was seen for skilled ST targeting cognitive goals. [name] wife at bedside for session. SLP facilitated session with a familiar complex medication management task in attempts to increase pt's accuracy and independence with strategies previous discussed with this task. Pt able to recall these strategies with Supervision A questions cues. He required Min A for error awareness when organizing a TID pill box according to his current list of medications, but decreased Supervision A for complex problem solving. Min A verbal cues were required for redirection in order to selectively attend in a mildly distracting environment. Pt has demonstrated good improvement and carryover of cognitive-linguistic problem solving and error awareness strategies with this session. He does still require Min A for visual scanning left to right when reading his list of meds - blue dot placed next to far left of page, which assisted with accuracy in reading and scanning. Pt left laying in bed with alarm set and needs within reach. Continue per current plan of  care.          Pain Pain Assessment Pain Scale: 0-10 Pain Score: 0-No pain  Therapy/Group: Individual Therapy  Arbutus Leas 08/03/2020, 11:30 AM

## 2020-08-03 NOTE — Progress Notes (Signed)
Occupational Therapy Session Note  Patient Details  Name: Tyler Richards MRN: [id number] Date of Birth: 01-13-1951  Today's Date: 08/03/2020 OT Individual Time: 1017-1100 and 1445-1543 OT Individual Time Calculation (min): 43 min and 58 min  Skilled Therapeutic Interventions/Progress Updates:    Pt greeted in bed with no c/o pain. Very happy to have eliminated after his suppository yesterday, feeling much better because of this. Started with family education with wife regarding how to don Ted hose. Taught her plastic bag method with verbal cuing and demonstration. She was able to return demonstration by donning the 2nd Ted stocking herself. Vcs for elevating bed height at home for improving body mechanics. She states they are getting a hospital bed for home. Supine<sit completed with Min A for fully scooting the Lt hip forward. Worked on dynamic sitting balance and ADL retraining while sitting EOB (after OT donned his abdominal binder- no c/o dizziness when sitting up). Pt able to utilize figure 4 position bilaterally to thread LEs into pants and don gripper socks given increased time and cues using just the Rt hand. Note that he was able to correctly orient his pants without cues. CGA for sitting balance and vcs for improving midline due to Lt lean. Mod A for sit<stand using RW with Lt hand orthosis. OT had to readjust his hand in orthosis when standing due to hypertonicity. Mod A for balance overall with cuing to improve neutral midline, once again pt leaning towards the Lt when pulling pants over hips on the Rt side. Pt returned to sitting position and still felt good. Mod A for squat pivot<w/c going towards the Rt side. After he brushed his hair while sitting at the sink, transitioned to Lt UE NMR/tone reduction using the UE ranger. Mod facilitation for full elbow extension and vcs for decreasing compensatory strategies of trunk. Pt completed flexion/extension of shoulder exercises and IR/ER circles. At  close of session pt remained sitting up in his w/c wearing the safety belt. Call bell in hand and Lt UE supported on pillow.   2nd Session 1:1 tx (58 min) Pt greeted in the w/c with no c/o pain. Per wife Tyler Richards, pt has remained sitting up in the w/c since his OT session this morning! We celebrated!! Pt was very motivated to go outdoors during tx. No c/o dizziness, still felt good sitting up (wearing abdominal binder and thigh high Teds). Escorted him outdoors via w/c and pt reported that he was "as happy as a lark," affect visibly brightened in the sunlight. Taught pt self ROM for the shoulder, elbow, wrist, and digits x10 reps each exercise. CGA for dynamic balance when leaning forward. Continued education regarding joint protection and stretching in end ranges as long as it is not painful. When he was returned to the room, Mod A for stand pivot<bed using the bedrail. Pt transitioned to supine with Mod A and bridged while OT stabilized the Lt LE and doffed pants. Pt remained comfortably in bed with all needs within reach and bed alarm set, hemiplegic side protected.  Therapy Documentation Precautions:  Precautions Precautions: Fall, Other (comment) Precaution Comments: left hemiparesis with left hemi inattention; monitor HR and BP, TEDs and abdominal binder when up Restrictions Weight Bearing Restrictions: No Pain: Pain Assessment Pain Scale: 0-10 Pain Score: 0-No pain ADL: ADL Eating: Supervision/safety Where Assessed-Eating: Bed level Grooming: Supervision/safety Where Assessed-Grooming: Bed level Upper Body Bathing: Minimal assistance Where Assessed-Upper Body Bathing: Other (Comment) (3:1) Lower Body Bathing: Dependent Where Assessed-Lower Body Bathing: Edge of  bed Upper Body Dressing: Maximal assistance Where Assessed-Upper Body Dressing: Edge of bed Lower Body Dressing: Dependent Where Assessed-Lower Body Dressing: Edge of bed Toileting: Dependent Where Assessed-Toileting:  Bedside Commode Toilet Transfer: Dependent Toilet Transfer Method: Other (comment) Tyler Richards) Toilet Transfer Equipment: Radiographer, therapeutic: Not assessed Social research officer, government: Not assessed      Therapy/Group: Individual Therapy  Tyler Richards A Tyler Richards 08/03/2020, 12:30 PM

## 2020-08-03 NOTE — Progress Notes (Signed)
Physical Therapy Session Note  Patient Details  Name: Tyler Richards MRN: [id number] Date of Birth: 1951-05-23  Today's Date: 08/03/2020 PT Individual Time: 1324-4010 PT Individual Time Calculation (min): 42 min   Short Term Goals: Week 4:  PT Short Term Goal 1 (Week 4): Patient will transfer bed <> wc with MinA consistently PT Short Term Goal 2 (Week 4): Patient will ambulate >39ft with MinA and LRAD, wc follow if needed PT Short Term Goal 3 (Week 4): Patient will propel wc >53ft with supervision  Skilled Therapeutic Interventions/Progress Updates:    Patient received sitting up in wc, agreeable to PT. He denies pain and appears to be in much better spirits today. PT propelling patient in wc to therapy gym for energy conservation. He was able to ambulate 64ft x2 with seated rest breaks between bouts, RW + modified handgrip with CGA/MinA. Patient requiring MinA for turns and verbal cues to remain within RW frame. Patient with minimal L lateral lean when ambulating, which is a significant improvement from previous therapy sessions. Blocked practice sit <> stand with mirror initially for visual feedback progressing to no mirror, relying on proprioceptive input for midline. Patient with stagger stance, R LE in front of L LE to promote increased weight bearing through L LE for transition to standing. CGA/MinA needed when mirror present for feedback. MinA needed when no mirror present. Patient transferring to wc via squat pivot and CGA. He was able to recall "checklist" from last week to ensure safe transfer (lock brakes, remove arm rest, reach with R UE). Patient propelling wc using B LE back to room (~130ft) with supervision and verbal cues to engage L LE. Patient remaining up in wc, seatbelt alarm on, call light within reach, wife at bedside.   Therapy Documentation Precautions:  Precautions Precautions: Fall, Other (comment) Precaution Comments: left hemiparesis with left hemi inattention; monitor  HR and BP, TEDs and abdominal binder when up Restrictions Weight Bearing Restrictions: No    Therapy/Group: Individual Therapy  Karoline Caldwell, PT, DPT, CBIS  08/03/2020, 7:38 AM

## 2020-08-03 NOTE — Progress Notes (Signed)
PHYSICAL MEDICINE & REHABILITATION PROGRESS NOTE   Subjective/Complaints: Bowels too hard yesterdya, good result with supp, good intake of meals per wife, no need for IVF for several days, no dizziness when up to toilet this am   ROS: Patient denies NVD, CP, SOB  Objective:   No results found. No results for input(s): WBC, HGB, HCT, PLT in the last 72 hours. No results for input(s): NA, K, CL, CO2, GLUCOSE, BUN, CREATININE, CALCIUM in the last 72 hours.  Intake/Output Summary (Last 24 hours) at 08/03/2020 0745 Last data filed at 08/03/2020 0520 Gross per 24 hour  Intake 600 ml  Output 250 ml  Net 350 ml        Physical Exam: Vital Signs Blood pressure 116/73, pulse (!) 57, temperature 98.4 F (36.9 C), temperature source Oral, resp. rate 20, height 6' (1.829 m), weight 76.2 kg, SpO2 97 %.   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 Functional deficits secondary to Right MCA infarct which require 3+ hours per day of interdisciplinary therapy in a comprehensive inpatient rehab setting.  Physiatrist is providing close team supervision and 24 hour management of active medical problems listed below.  Physiatrist and rehab team continue to assess barriers to discharge/monitor patient progress toward functional and medical goals  Care Tool:  Bathing    Body parts bathed by patient: Left arm, Chest, Abdomen, Right upper leg,  Left upper leg, Right lower leg, Face, Front perineal area, Left lower leg   Body parts bathed by helper: Buttocks, Right arm Body parts n/a: Buttocks   Bathing assist Assist Level: Minimal Assistance - Patient > 75%     Upper Body Dressing/Undressing Upper body dressing   What is the patient wearing?: Pull over shirt    Upper body assist Assist Level: Minimal Assistance - Patient > 75%    Lower Body Dressing/Undressing Lower body dressing      What is the patient wearing?: Pants     Lower body assist Assist for lower body dressing: Moderate Assistance - Patient 50 - 74%     Toileting Toileting    Toileting assist Assist for toileting: Moderate Assistance - Patient 50 - 74%     Transfers Chair/bed transfer  Transfers assist     Chair/bed transfer assist level: Moderate Assistance - Patient 50 - 74%     Locomotion Ambulation   Ambulation assist   Ambulation activity did not occur: Safety/medical concerns (required skilled intervention to ambulate 53ft at hallway rail)  Assist level: Moderate Assistance - Patient 50 - 74% Assistive device: Hand held assist Max distance: 20ft   Walk 10 feet activity   Assist  Walk 10 feet activity did not occur: Safety/medical concerns  Assist level: Moderate Assistance - Patient - 50 - 74% Assistive device: Hand held assist   Walk 50 feet activity   Assist Walk 50 feet with 2 turns activity did not occur: Safety/medical concerns  Assist level: Moderate Assistance - Patient - 50 - 74% Assistive device: Hand  held assist    Walk 150 feet activity   Assist Walk 150 feet activity did not occur: Safety/medical concerns  Assist level: 2 helpers Assistive device: Other (comment) (3 Musketeer)    Walk 10 feet on uneven surface  activity   Assist Walk 10 feet on uneven surfaces activity did not occur: Safety/medical concerns         Wheelchair     Assist Will patient use wheelchair at discharge?:  Yes Type of Wheelchair: Manual    Wheelchair assist level: Supervision/Verbal cueing Max wheelchair distance: 50    Wheelchair 50 feet with 2 turns activity    Assist        Assist Level: Supervision/Verbal cueing   Wheelchair 150 feet activity     Assist      Assist Level: Moderate Assistance - Patient 50 - 74%   Medical Problem List and Plan: 1.  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  Continue CIR PT, OT, SLP  2.  Antithrombotics: -DVT/anticoagulation: Venous Doppler studies negative.  SCDs.  No heparin given hemorrhage.             -antiplatelet therapy: Aspirin 81 mg daily, currently off of Brilinta given GIB and ICA has reoccluded 3. Pain Management: Tylenol as needed.   Complaints of left leg aching, worst at night, started after his stroke. Does not feel like gout. Discussed trialing Gabapentin 300mg  TID and patient and wife are agreeable. Will also help him to sleep better at night. Monitor for sedation during the day.   11/6: Gabapentin has helped him sleep better at night.  11/7: well controlled  4. Mood: Provide emotional support             -antipsychotic agents: N/A 5. Neuropsych: This patient is?  Fully capable of making decisions on his own behalf. 6. Skin/Wound Care: Routine skin checks 7. Fluids/Electrolytes/Nutrition: Routine in and outs d/c IV 8.  Constipation, now with diarrhea.   DC fiber, add probiotic , increase immodium -improved 11/4  11/6: patient was constipated this morning but able to have BM. Discussed that this could be due to imodium. Wife prefers to continue at this time as has been greatly helping with diarrhea.   11/7: constipation feels uncomfortably for patient: change imodium to PRN- monitor  9.  Hyperlipidemia.  Lipitor 10.  Gout.  Colchicine twice daily. Held due to  diarrhea, most recently had flare up in foot and RIght elbow   Monitor for any gout flareups  Reasonably controlled , gouty tophi R>L 1st MTP-  No evidence of flare-up 11.  Orthostasis/bradycardia.  ProAmatine 5 mg every 8 hours as well as Florinef 0.1 mg daily.  Monitor with increased mobility.  Follow-up per cardiology services, Order orthostatic vitals  Vitals:   08/02/20 1931 08/03/20 0504  BP: (!) 112/58 116/73  Pulse: (!) 56 (!) 57  Resp: 18 20  Temp: 98.2 F (36.8 C) 98.4 F (36.9 C)  SpO2: 98% 97%   18mmHg drop lying to sit this am without dizziness   Recurrent orthostasis. Continue midodrine and fludricortisone, changed midorine to 10mg   11/6: improved, but BP still soft and with dizziness with therapy. Increase florinef to 0.15BID at 0800 and 12pm.  12.  Enterobacter UTI.  Completing course of Bactrim. 13.  Rectal bleeding.  Follow-up GI services.  Currently no plan for endoscopic studies and monitor  hemoglobin hematocrit.   colonoscopy  As OP  Hemoglobin 8.9 on 11/2  14.  Spasticity Left hamstring and Left pectoralis which bothers pt at noc  Baclofen changed to Klonopin 0.25mg  qhs may be prn   Botox left pectoralis has reduced tone from MAS 3 to MAS 2, Botox the hamstring has reduced tone MAS 1.  Elbow flexor tone still at MAS 2/3.  Will likely need to repeat as outpatient 16.  Post stroke dysphagia  D2 thins, advance diet as tolerated- pt prefers D1  LOS: 25 days A FACE TO FACE EVALUATION WAS PERFORMED  Charlett Blake 08/03/2020, 7:45 AM

## 2020-08-03 NOTE — Progress Notes (Signed)
Patient ID: Tyler Richards, male   DOB: 05-Mar-1951, 69 y.o.   MRN: [id number]   Wheelchair ordered through [facility].  [place], [place]

## 2020-08-04 ENCOUNTER — Inpatient Hospital Stay (HOSPITAL_COMMUNITY): Payer: Medicare Other | Admitting: Speech Pathology

## 2020-08-04 ENCOUNTER — Inpatient Hospital Stay (HOSPITAL_COMMUNITY): Payer: Medicare Other

## 2020-08-04 ENCOUNTER — Inpatient Hospital Stay (HOSPITAL_COMMUNITY): Payer: Medicare Other | Admitting: Occupational Therapy

## 2020-08-04 MED ORDER — FLEET ENEMA 7-19 GM/118ML RE ENEM
1.0000 | ENEMA | Freq: Every day | RECTAL | Status: DC | PRN
  Administered 2020-08-04 – 2020-08-07 (×2): 1 via RECTAL
  Filled 2020-08-04 (×2): qty 1

## 2020-08-04 MED ORDER — POLYETHYLENE GLYCOL 3350 17 G PO PACK
17.0000 g | PACK | Freq: Every day | ORAL | Status: DC | PRN
  Administered 2020-08-06 – 2020-08-07 (×2): 17 g via ORAL
  Filled 2020-08-04 (×2): qty 1

## 2020-08-04 MED ORDER — DIBUCAINE 1 % EX OINT
TOPICAL_OINTMENT | CUTANEOUS | Status: DC | PRN
  Filled 2020-08-04 (×2): qty 28.35

## 2020-08-04 NOTE — Progress Notes (Signed)
[facility] PHYSICAL MEDICINE & REHABILITATION PROGRESS NOTE   Subjective/Complaints:  \Nightmares last noc, worrying about his wife not being with him, RN giving prn klonopin this am , enjoyed going outsid efor therapy yesterday   ROS: Patient denies NVD, CP, SOB  Objective:   No results found. No results for input(s): WBC, HGB, HCT, PLT in the last 72 hours. No results for input(s): NA, K, CL, CO2, GLUCOSE, BUN, CREATININE, CALCIUM in the last 72 hours.  Intake/Output Summary (Last 24 hours) at 08/04/2020 0716 Last data filed at 08/03/2020 2030 Gross per 24 hour  Intake 240 ml  Output 200 ml  Net 40 ml        Physical Exam: Vital Signs Blood pressure 111/75, pulse (!) 47, temperature 98.5 F (36.9 C), temperature source Oral, resp. rate 18, height 6' (1.829 m), weight 78.2 kg, SpO2 98 %.   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 Functional deficits secondary to Right MCA infarct which require 3+ hours per day of interdisciplinary therapy in a comprehensive inpatient rehab setting.  Physiatrist is providing close team supervision and 24 hour management of active medical problems listed below.  Physiatrist and rehab team continue to assess barriers to discharge/monitor patient progress toward functional and medical goals  Care Tool:  Bathing     Body parts bathed by patient: Left arm, Chest, Abdomen, Right upper leg, Left upper leg, Right lower leg, Face, Front perineal area, Left lower leg   Body parts bathed by helper: Buttocks, Right arm Body parts n/a: Buttocks   Bathing assist Assist Level: Minimal Assistance - Patient > 75%     Upper Body Dressing/Undressing Upper body dressing   What is the patient wearing?: Pull over shirt    Upper body assist Assist Level: Minimal Assistance - Patient > 75%    Lower Body Dressing/Undressing Lower body dressing      What is the patient wearing?: Pants     Lower body assist Assist for lower body dressing: Moderate Assistance - Patient 50 - 74% (taking into consideration standing balance assistance)     Toileting Toileting    Toileting assist Assist for toileting: Moderate Assistance - Patient 50 - 74%     Transfers Chair/bed transfer  Transfers assist     Chair/bed transfer assist level: Minimal Assistance - Patient > 75%     Locomotion Ambulation   Ambulation assist   Ambulation activity did not occur: Safety/medical concerns (required skilled intervention to ambulate 25ft at hallway rail)  Assist level: Minimal Assistance - Patient > 75% Assistive device: Walker-rolling Max distance: 25   Walk 10 feet activity   Assist  Walk 10 feet activity did not occur: Safety/medical concerns  Assist level: Minimal Assistance - Patient > 75% Assistive device: Walker-rolling   Walk 50 feet activity   Assist Walk 50 feet with 2 turns activity did not occur: Safety/medical concerns  Assist level: Moderate Assistance -  Patient - 50 - 74% Assistive device: Hand held assist    Walk 150 feet activity   Assist Walk 150 feet activity did not occur: Safety/medical concerns  Assist level: 2 helpers Assistive device: Other (comment) (3 Musketeer)    Walk 10 feet on uneven surface  activity   Assist Walk 10 feet on uneven surfaces activity did not occur:  Safety/medical concerns         Wheelchair     Assist Will patient use wheelchair at discharge?: Yes Type of Wheelchair: Manual    Wheelchair assist level: Supervision/Verbal cueing Max wheelchair distance: 100    Wheelchair 50 feet with 2 turns activity    Assist        Assist Level: Supervision/Verbal cueing   Wheelchair 150 feet activity     Assist      Assist Level: Moderate Assistance - Patient 50 - 74%   Medical Problem List and Plan: 1.  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  Continue CIR PT, OT, SLP  2.  Antithrombotics: -DVT/anticoagulation: Venous Doppler studies negative.  SCDs.  No heparin given hemorrhage.             -antiplatelet therapy: Aspirin 81 mg daily, currently off of Brilinta given GIB and ICA has reoccluded 3. Pain Management: Tylenol as needed.   Complaints of left leg aching, worst at night, started after his stroke. Does not feel like gout. Discussed trialing Gabapentin 300mg  TID and patient and wife are agreeable.   11/8: well controlled  4. Mood: Provide emotional support             -antipsychotic agents: N/A 5. Neuropsych: This patient is?  Fully capable of making decisions on his own behalf. 6. Skin/Wound Care: Routine skin checks 7. Fluids/Electrolytes/Nutrition: Routine in and outs d/c IV 8.  Constipation, now with diarrhea.   Overall improved immodium changed to prn   9.  Hyperlipidemia.  Lipitor 10.  Gout.  Colchicine twice daily. Held due to diarrhea, most recently had flare up in foot and RIght elbow   Monitor for any gout flareups  Reasonably controlled , gouty tophi R>L 1st MTP-  No evidence of flare-up 11.  Orthostasis/bradycardia.  ProAmatine 5 mg every 8 hours as well as Florinef 0.1 mg daily.  Monitor with increased mobility.  Follow-up per cardiology  services, Order orthostatic vitals  Vitals:   08/03/20 1928 08/04/20 0526  BP: 120/89 111/75  Pulse: (!) 47 (!) 47  Resp: 18 18  Temp: 98.5 F (36.9 C) 98.5 F (36.9 C)  SpO2: 99% 98%   Lying to sit ortho BP ok this am   Recurrent orthostasis. Continue midodrine and fludricortisone, changed midorine to 10mg   11/6: improved, but BP still soft and with dizziness with therapy. Increase florinef to 0.15BID at 0800 and 12pm.  12.  Enterobacter UTI.  Completing course of Bactrim. 13.  Rectal bleeding.  Follow-up GI services.  Currently no plan for endoscopic studies and monitor hemoglobin hematocrit.   colonoscopy  As OP  Hemoglobin 8.9 on 11/2  14.  Spasticity Left hamstring and Left pectoralis which bothers pt at noc  Baclofen changed to Klonopin 0.25mg  qhs may be prn   Botox left pectoralis has reduced tone from MAS 3 to MAS 2, Botox the hamstring has reduced tone MAS 1.  Elbow flexor tone still at MAS 2/3.  Will likely need to repeat as outpatient 16.  Post stroke dysphagia  D2 thins, advance diet as tolerated- pt prefers D1  LOS: 26 days A FACE TO FACE EVALUATION WAS PERFORMED  Charlett Blake 08/04/2020, 7:16 AM

## 2020-08-04 NOTE — Progress Notes (Signed)
Speech Language Pathology Daily Session Note  Patient Details  Name: ARDA KEADLE MRN: [id number] Date of Birth: [birth year]  Today's Date: 08/04/2020 SLP Individual Time: 3810-1751 SLP Individual Time Calculation (min): 43 min  Short Term Goals: Week 4: SLP Short Term Goal 1 (Week 4): Pt will tolerate current diet, demonstrating minimal overt s/sx aspiration and ues of compensatory swallow strategies Mod I. SLP Short Term Goal 2 (Week 4): Patient will be able to perform mildly complex to complex functional tasks with Supervision A for problem solving. SLP Short Term Goal 3 (Week 4): Patient will demonstrate anticipatory awareness related to his deficits during ADL's and discussion regarding guided discharge needs planning with Supervision A SLP Short Term Goal 4 (Week 4): Pt will detect functional errors with Min A verbal and visual cues.  Skilled Therapeutic Interventions: Pt was seen for skilled ST targeting cognitive goals. His wife was present for session. Upon arrival pt with reports of abdominal pain likely due to constipation, although initially agreeable to participate. He completed a complex deductive reasoning puzzle with overall Min A verbal cues for problem solving strategies and organization. He also started to participate in a time based word problem task, however due to increasing pain during session refused to continue and ask SLP to contact RN regarding request for suppository and/or other interventions for constipation. SLP made RN aware of pt's reports and requests for suppository. Pt missed last 15 mins skilled ST session. He was left laying in bed with alarm set and needs within reach, wife still present at bedside. Continue per current plan of care.          Pain Pain Assessment Pain Scale: Faces Faces Pain Scale: Hurts little more Pain Type: Acute pain Pain Location: Abdomen Pain Descriptors / Indicators: Aching;Discomfort Pain Onset: Progressive Pain  Intervention(s): RN made aware Multiple Pain Sites: No  Therapy/Group: Individual Therapy  Arbutus Leas 08/04/2020, 3:01 PM

## 2020-08-04 NOTE — Progress Notes (Signed)
Physical Therapy Session Note  Patient Details  Name: Tyler Richards MRN: [id number] Date of Birth: Dec 16, 1950  Today's Date: 08/04/2020 PT Individual Time: 1300-1330 PT Individual Time Calculation (min): 30 min   Short Term Goals: Week 4:  PT Short Term Goal 1 (Week 4): Patient will transfer bed <> wc with MinA consistently PT Short Term Goal 2 (Week 4): Patient will ambulate >31ft with MinA and LRAD, wc follow if needed PT Short Term Goal 3 (Week 4): Patient will propel wc >73ft with supervision  Skilled Therapeutic Interventions/Progress Updates:    Patient in supine with RN in the room to perform perineal hygiene and brief change.  Patient's wife reports not a great day due to pt feeling constipated.  Patient supine to sit min A for balance pt using rail and HOB elevated.  Transfer sit to stand to [facility] with min to mod A and to bathroom via [place] total A.  Patient needing assist to doff and don brief.  Transfer to w/c squat pivot mod A.  Assisted to bed in w/c and transfer to bed mod A with cues.  Sit to supine min A and cues for positioning.  Discussed pt drinking a lot of milk which can contribute to constipation; RN made aware pt may need dietitian consult.  Patient left in supine with wife present and bed alarm activated.   Therapy Documentation Precautions:  Precautions Precautions: Fall, Other (comment) Precaution Comments: left hemiparesis with left hemi inattention; monitor HR and BP, TEDs and abdominal binder when up Restrictions Weight Bearing Restrictions: No Pain: Pain Assessment Pain Scale: 0-10 Pain Score: 0-No pain Faces Pain Scale: Hurts little more Pain Type: Acute pain Pain Location: Abdomen Pain Descriptors / Indicators: Aching;Discomfort Pain Onset: Progressive Pain Intervention(s): RN made aware Multiple Pain Sites: No    Therapy/Group: Individual Therapy  [name]  Magda Kiel, PT 08/04/2020, 4:27 PM

## 2020-08-04 NOTE — Progress Notes (Signed)
Physical Therapy Session Note  Patient Details  Name: Tyler Richards MRN: [id number] Date of Birth: 04/16/1951  Today's Date: 08/04/2020 PT Individual Time: 1000-1100 PT Individual Time Calculation (min): 60 min   Short Term Goals: Week 4:  PT Short Term Goal 1 (Week 4): Patient will transfer bed <> wc with MinA consistently PT Short Term Goal 2 (Week 4): Patient will ambulate >49ft with MinA and LRAD, wc follow if needed PT Short Term Goal 3 (Week 4): Patient will propel wc >46ft with supervision  Skilled Therapeutic Interventions/Progress Updates:    Patient received sitting up in wc, agreeable to PT. He denies pain, but states that he's feeling more anxious today and didn't sleep well last night. BP in sitting with TED hose on and abdominal binder: 96/56. Patient able to complete sit<> stand with MinA and verbal cues to maintain midline orientation. Patient demonstrating increased L lateral lean in standing, despite verbal and tactile cuing. He was able to ambulate ~97ft with ModA before stating that he felt as though he had to have a bowel movement and requested to sit down. Upon sitting down, patient stated that he no longer felt as though he had to have a bowel movement and was agreeable to trial ambulation again. Patient ambulating additional 40ft with HHA and ModA, verbal cues needed for weight shift R to allow for full L LE swing. Patient often losing his balance back to the L after stepping with L due to inadequate weight shift R. After turning corner, another therapist and patient were there and this patient was unable to problem solve stepping around other patient to avoid collision. Patient becoming increasingly flustered/anxious, requesting to sit. After a seated rest break, patient agreeable to trialing gait again, this time with R anterior shell AFO to assist with facilitating L knee extension in stance and improve L foot clearance through swing phase. PT unable to assess success of AFO  use as patient requested to sit down again stating he had to use the restroom. Patient agreeable to try sitting on toilet. [place] stand pivot transfer to toilet. PT providing encouragement to allow patient to use RUE to doff pants in standing. Patient demonstrating increased self-limiting behaviors at times. He was unable to have successful bowel movement after sitting on toilet for extended time. RN aware. PT educated patient and wife on importance of good nutrition and adequate hydration to assist with having complete bowel movements. Patient returned to bed, per wife's request. ModA provided to doff pants, per wifes request. Bed alarm on, call light within reach, wife at bedside.   Therapy Documentation Precautions:  Precautions Precautions: Fall, Other (comment) Precaution Comments: left hemiparesis with left hemi inattention; monitor HR and BP, TEDs and abdominal binder when up Restrictions Weight Bearing Restrictions: No    Therapy/Group: Individual Therapy  Karoline Caldwell, PT, DPT, CBIS  08/04/2020, 7:33 AM

## 2020-08-04 NOTE — Progress Notes (Signed)
Occupational Therapy Session Note  Patient Details  Name: Tyler Richards MRN: [id number] Date of Birth: February 27, 1951  Today's Date: 08/04/2020 OT Individual Time: 2426-8341 OT Individual Time Calculation (min): 45 min    Short Term Goals: Week 4:  OT Short Term Goal 1 (Week 4): Pt will donn a pullover shirt with min guard assist following hemi techniques. OT Short Term Goal 2 (Week 4): Pt will complete toilet transfer with min assist stand pivot. OT Short Term Goal 3 (Week 4): Pt will use the LUE at a gross assist level with min facilitation for holding items to be opened with selfcare tasks. OT Short Term Goal 4 (Week 4): Pt will complete LB dressing at min assist level sit to stand except for donning TEDs.  Skilled Therapeutic Interventions/Progress Updates:    Pt completed bathing and dressing from the EOB during session.  BP in supine at 106/58 and in sitting at 107/58 without TEDs or abdominal binder.  He was able to transfer to the EOB with min guard assist and maintained dynamic sitting balance throughout bathing and dressing with min assist and occasional LOB to the left when reaching down to wash his feet and with some lower dressing.  He needed max hand over hand for washing the RUE using the LUE.  He also needed min assist for completion of all bathing sit to stand, with min assist for donning a pullover shirt and mod assist for donning brief and pants.  No dizziness reported in standing during washing of buttocks or with pulling garments over her his hips.  He needed total assist for donning TEDS and binder as well as mod assist for donning his shoes and tying them.  Finished session with transfer stand pivot to the wheelchair at mod assist level.  Call button and phone in reach with safety belt in place and spouse present for support.    Therapy Documentation Precautions:  Precautions Precautions: Fall, Other (comment) Precaution Comments: left hemiparesis with left hemi inattention;  monitor HR and BP, TEDs and abdominal binder when up Restrictions Weight Bearing Restrictions: No Pain: Pain Assessment Pain Scale: Faces Pain Score: 0-No pain ADL: See Care Tool Section for some details of mobility and selfcare  Therapy/Group: Individual Therapy  Purity Irmen OTR/L 08/04/2020, 12:13 PM

## 2020-08-05 ENCOUNTER — Inpatient Hospital Stay (HOSPITAL_COMMUNITY): Payer: Medicare Other

## 2020-08-05 ENCOUNTER — Inpatient Hospital Stay (HOSPITAL_COMMUNITY): Payer: Medicare Other | Admitting: Speech Pathology

## 2020-08-05 ENCOUNTER — Inpatient Hospital Stay (HOSPITAL_COMMUNITY): Payer: Medicare Other | Admitting: Occupational Therapy

## 2020-08-05 MED ORDER — DOCUSATE SODIUM 100 MG PO CAPS
100.0000 mg | ORAL_CAPSULE | Freq: Two times a day (BID) | ORAL | Status: DC
  Administered 2020-08-05 – 2020-08-09 (×10): 100 mg via ORAL
  Filled 2020-08-05 (×12): qty 1

## 2020-08-05 NOTE — Progress Notes (Signed)
Speech Language Pathology Daily Session Note  Patient Details  Name: Tyler Richards MRN: [id number] Date of Birth: 16-Aug-1951  Today's Date: 08/05/2020 SLP Individual Time: 1000-1030 SLP Individual Time Calculation (min): 30 min  Short Term Goals: Week 4: SLP Short Term Goal 1 (Week 4): Pt will tolerate current diet, demonstrating minimal overt s/sx aspiration and ues of compensatory swallow strategies Mod I. SLP Short Term Goal 2 (Week 4): Patient will be able to perform mildly complex to complex functional tasks with Supervision A for problem solving. SLP Short Term Goal 3 (Week 4): Patient will demonstrate anticipatory awareness related to his deficits during ADL's and discussion regarding guided discharge needs planning with Supervision A SLP Short Term Goal 4 (Week 4): Pt will detect functional errors with Min A verbal and visual cues.  Skilled Therapeutic Interventions:   Patient seen for skilled ST session focusing on cognitive skills with task of locating and writing down food items and their calories using a calorie counting booklet. Patient demonstrated good visual scanning on left and write and did request larger print which also helped. He required minimal frequency and intensity of cues to find headings of food types (breads, meats, etc) and a straight edge to help with visual scanning. 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  He did not require cues to return to task. Patient continues to benefit from skilled SLP intervention to maximize cognitive-linguistic, speech and swallow function prior to discharge.  Pain Pain Assessment Pain Scale: 0-10 Pain Score: 0-No pain  Therapy/Group: Individual Therapy   Sonia Baller, MA, CCC-SLP Speech Therapy

## 2020-08-05 NOTE — Progress Notes (Signed)
Physical Therapy Session Note  Patient Details  Name: Tyler Richards MRN: [id number] Date of Birth: 04-27-1951  Today's Date: 08/05/2020 PT Individual Time: 0223-3612 PT Individual Time Calculation (min): 57 min   Short Term Goals: Week 4:  PT Short Term Goal 1 (Week 4): Patient will transfer bed <> wc with MinA consistently PT Short Term Goal 2 (Week 4): Patient will ambulate >41ft with MinA and LRAD, wc follow if needed PT Short Term Goal 3 (Week 4): Patient will propel wc >23ft with supervision  Skilled Therapeutic Interventions/Progress Updates:    Patient received supine in bed, wife at bedside, agreeable to PT. He denies pain. Patient reports feeling "much better" since receiving enema last night. PT donning TED hose with patient supine, ModA to don pants, while supine and encouragement from PT for max participation/effort from patient. Supervision to come to sit edge of bed, ModA to don clean tshirt. PT donned abdominal binder with patient seated edge of bed. MinA squat pivot to wc. Blocked practice squat pivot transfers to/from therapy mat. Patient able to direct care regarding wc brakes, armrest and foot rests for proper/safe set up of transfer. He requires MinA when transferring to mat leading R due to R UE pushing; CGA when transferring L back into wc. Blocked practice sit <> stand transition without mirror for visual feedback. Patient requiring CGA/MinA to come to standing from standard height surface. Minimal evidence of L lateral lean during transition, which is an improvement. Patient completing this task x8. Patient then attempting sit <> stand in stagger stance with L foot posterior to R for greater emphasis on use of L LE. Patient requiring closer to [facility] to complete this sit <> stand due to decreased L LE strength and unbalanced muscle activation. Patient complete this task x8. CGA squat pivot back to wc leading L. Patient returned to room in wc, seatbelt alarm on, call light within  reach, wife at bedside.   Therapy Documentation Precautions:  Precautions Precautions: Fall, Other (comment) Precaution Comments: left hemiparesis with left hemi inattention; monitor HR and BP, TEDs and abdominal binder when up Restrictions Weight Bearing Restrictions: No    Therapy/Group: Individual Therapy  Debbora Dus 08/05/2020, 7:36 AM

## 2020-08-05 NOTE — Patient Care Conference (Signed)
Inpatient RehabilitationTeam Conference and Plan of Care Update Date: 08/05/2020   Time: 10:35 AM    Patient Name: Tyler Richards      Medical Record Number: [id number]  Date of Birth: 12-11-1950 Sex: Male         Room/Bed: 4M02C/4M02C-01 Payor Info: Payor: MEDICARE / Plan: MEDICARE PART A AND B / Product Type: *No Product type* /    Admit Date/Time:  07/09/2020  3:40 PM  Primary Diagnosis:  Right middle cerebral artery stroke [facility])  Hospital Problems: Principal Problem:   Right middle cerebral artery stroke [facility]) Active Problems:   Dysphagia, post-stroke   Rectal bleeding   Diarrhea   Spastic hemiparesis [facility])    Expected Discharge Date: Expected Discharge Date: 08/12/20  Team Members Present: Physician leading conference: Dr. Alysia Penna Care Coodinator Present: Dorien Chihuahua, RN, BSN, CRRN;Christina Sampson Goon, BSW Nurse Present: Suella Grove, RN PT Present: Estevan Ryder, PT OT Present: Clyda Greener, OT SLP Present: Jettie Booze, CF-SLP PPS Coordinator present : Ileana Ladd, Burna Mortimer, SLP     Current Status/Progress Goal Weekly Team Focus  Bowel/Bladder   Continent/Incontinent; Last BM 11/9  Become continent x2 and have regular BMs every day or every other day  Assess every shift and as needed   Swallow/Nutrition/ Hydration   Pt still prefers to remain on Dys 1 (puree) textures, Mod I strategies  Mod I  carryover swallow strategies, continue to assess whether pt would like to work toward solid advancement   ADL's             Mobility   SPV bed mobility, MinA/ModA STS, MinA transfers, ModA x1 gait using HHA, increasing anxiety resulting in poor tolerance to therapy  min assist overall at ambulatory level  gait tx, transfer tx, wc mobility, family declined custom wc d/t finances, family education**   Communication   95% intelligible, Mod I-Supervision depending on fatigue and other cognitive demands presented  Mod I  carryover speech strategies  during functional and more cognitively demanding tasks   Safety/Cognition/ Behavioral Observations  Supervision-Min basic, Min-Mod A more complex and for recall  Supervision-Mod I  semi-compelx to complex problem solving, error awareness, recall, education   Pain   Pain 4/10 to left leg, prn given per MAR  Pain <5/10  Assess every shift and as needed   Skin   Bilateral arm bruising, left arm skin tear, redness to bottom  Prevent further breakdown; utilize foam dressings and change prn  Assess every shift and as needed     Discharge Planning:  Discharging home with spouse (24/7). Has: Grab bars (tub and shower), WC, RW and Cane. Mobile Home (2 steps to enter (R side railings)   Team Discussion: Right MCA CVA with hemiparesis and left neglect. Bowel issues improved after medication modification, although patient perseverates on bowel. Improved cognitive issues but prefers pureed diet. Orthostasis  Stable however continues to note dizziness upon standing and during bedside bathe/dressing. Progress impaired by co-dependency on wife and anxiety. Note left upper extremity weakness and decreased tone in fingers and arm. Left lateral lean and hand over hand assistance.  Patient on target to meet rehab goals: yes, supervision - mod assist goals. Currently min assist for transfers, mod assist for gait.  *See Care Plan and progress notes for long and short-term goals.   Revisions to Treatment Plan:  OT up graded goals Teaching Needs: Transfers, toileting, medications, etc.   Current Barriers to Discharge: Decreased caregiver support and Behavior  Possible Resolutions to  Barriers: Hands on family education with the wife     Medical Summary Current Status: diarrhea improved now with  constipation , hgb stable  Barriers to Discharge: Medical stability   Possible Resolutions to Barriers/Weekly Focus: cont bowel med adjustment  to avoid constipation and incont due to loose stools   Continued  Need for Acute Rehabilitation Level of Care: The patient requires daily medical management by a physician with specialized training in physical medicine and rehabilitation for the following reasons: Direction of a multidisciplinary physical rehabilitation program to maximize functional independence : Yes Medical management of patient stability for increased activity during participation in an intensive rehabilitation regime.: Yes Analysis of laboratory values and/or radiology reports with any subsequent need for medication adjustment and/or medical intervention. : Yes   I attest that I was present, lead the team conference, and concur with the assessment and plan of the team.   Dorien Chihuahua B 08/05/2020, 3:09 PM

## 2020-08-05 NOTE — Progress Notes (Signed)
Physical Therapy Session Note  Patient Details  Name: Tyler Richards MRN: [id number] Date of Birth: 06-10-1951  Today's Date: 08/05/2020 PT Individual Time: 1300-1327 PT Individual Time Calculation (min): 27 min   Short Term Goals: Week 4:  PT Short Term Goal 1 (Week 4): Patient will transfer bed <> wc with MinA consistently PT Short Term Goal 2 (Week 4): Patient will ambulate >68ft with MinA and LRAD, wc follow if needed PT Short Term Goal 3 (Week 4): Patient will propel wc >22ft with supervision  Skilled Therapeutic Interventions/Progress Updates:    Patient received sitting up in wc, agreeable to PT. He denies pain. Lunch tray arrived late, patient and wife stating he will eat after therapy session. PT propelling patient in wc to therapy gym for time management and energy conservation. He was able to ambulate 21ft x2 with HHA and MinA. Verbal cues and manual facilitation for improved R weight shift and prevention of L lateral lean. With improved gait speed, patient able to demonstrate appropriate B weight shift and minimal excessive L lateral lean. Improving step-through gait pattern as well. Though he does still demonstrate decreased L knee flexion through swing phase, can be attributed to history of gout, but excessive L knee flexion in stance. No evidence of L knee buckling. Patient propelling himself in wc using B LE to his room with supervision. Verbal cues needed to attend to environment on L and to avoid obstacles. Patient remaining up in wc to eat lunch, seatbelt alarm on, call light within reach, wife at bedside.   Therapy Documentation Precautions:  Precautions Precautions: Fall, Other (comment) Precaution Comments: left hemiparesis with left hemi inattention; monitor HR and BP, TEDs and abdominal binder when up Restrictions Weight Bearing Restrictions: No    Therapy/Group: Individual Therapy  Karoline Caldwell, PT, DPT, CBIS  08/05/2020, 1:31 PM

## 2020-08-05 NOTE — Progress Notes (Signed)
[place] PHYSICAL MEDICINE & REHABILITATION PROGRESS NOTE   Subjective/Complaints:  No issues overnite, Pt with abd pain relieved by BM.  KUB showing stool  ROS: Patient denies NVD, CP, SOB  Objective:   DG Abd 1 View  Result Date: 08/04/2020 CLINICAL DATA:  Constipation EXAM: ABDOMEN - 1 VIEW COMPARISON:  None. FINDINGS: Two supine frontal views of the abdomen and pelvis demonstrate marked retained stool throughout the colon compatible with given history of constipation. No bowel obstruction or ileus. No masses or abnormal calcifications. Vertical lucency overlying the right hemiabdomen likely reflects a skin fold. IMPRESSION: 1. Moderate fecal retention consistent with constipation. No evidence of obstruction. Electronically Signed   By: Randa Ngo M.D.   On: 08/04/2020 19:32   No results for input(s): WBC, HGB, HCT, PLT in the last 72 hours. No results for input(s): NA, K, CL, CO2, GLUCOSE, BUN, CREATININE, CALCIUM in the last 72 hours.  Intake/Output Summary (Last 24 hours) at 08/05/2020 0736 Last data filed at 08/05/2020 0640 Gross per 24 hour  Intake 360 ml  Output --  Net 360 ml        Physical Exam: Vital Signs Blood pressure 110/73, pulse 67, temperature 98.3 F (36.8 C), temperature source Oral, resp. rate 18, height 6' (1.829 m), weight 73.3 kg, SpO2 100 %.   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 Functional deficits secondary to Right MCA infarct which require 3+ hours per day of interdisciplinary therapy in a comprehensive inpatient rehab setting.  Physiatrist is providing close team supervision and 24 hour management of active medical problems listed below.  Physiatrist and rehab team continue to assess barriers to discharge/monitor patient progress toward functional and medical goals  Care Tool:  Bathing    Body parts bathed by patient: Left arm, Chest, Abdomen, Right upper leg, Left upper leg, Right lower leg, Face, Front perineal area, Left lower leg, Buttocks   Body parts bathed by helper: Right arm Body parts n/a: Buttocks   Bathing assist Assist Level: Minimal Assistance - Patient > 75%     Upper Body Dressing/Undressing Upper body dressing   What is the patient wearing?: Pull over shirt    Upper body assist Assist Level: Minimal Assistance - Patient > 75%    Lower Body Dressing/Undressing Lower body dressing      What is the patient wearing?: Incontinence brief, Pants     Lower body assist Assist for lower body dressing: Moderate Assistance - Patient 50 - 74%     Toileting Toileting    Toileting assist Assist for toileting: Moderate Assistance - Patient 50 - 74%     Transfers Chair/bed transfer  Transfers assist     Chair/bed transfer assist level: Moderate Assistance - Patient 50 - 74%     Locomotion Ambulation   Ambulation assist   Ambulation activity did not occur: Safety/medical concerns (required skilled intervention to ambulate 60ft at hallway  rail)  Assist level: Moderate Assistance - Patient 50 - 74% Assistive device: Walker-rolling Max distance: 8   Walk 10 feet activity   Assist  Walk 10 feet activity did not occur: Safety/medical concerns  Assist level: Moderate Assistance - Patient - 50 - 74% Assistive device: Hand held assist   Walk 50 feet  activity   Assist Walk 50 feet with 2 turns activity did not occur: Safety/medical concerns  Assist level: Moderate Assistance - Patient - 50 - 74% Assistive device: Hand held assist    Walk 150 feet activity   Assist Walk 150 feet activity did not occur: Safety/medical concerns  Assist level: 2 helpers Assistive device: Other (comment) (3 Musketeer)    Walk 10 feet on uneven surface  activity   Assist Walk 10 feet on uneven surfaces activity did not occur: Safety/medical concerns         Wheelchair     Assist Will patient use wheelchair at discharge?: Yes Type of Wheelchair: Manual    Wheelchair assist level: Supervision/Verbal cueing Max wheelchair distance: 100    Wheelchair 50 feet with 2 turns activity    Assist        Assist Level: Supervision/Verbal cueing   Wheelchair 150 feet activity     Assist      Assist Level: Moderate Assistance - Patient 50 - 74%   Medical Problem List and Plan: 1.  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  Continue CIR PT, OT, SLP  2.  Antithrombotics: -DVT/anticoagulation: Venous Doppler studies negative.  SCDs.  No heparin given hemorrhage.             -antiplatelet therapy: Aspirin 81 mg daily, currently off of Brilinta given GIB and ICA has reoccluded 3. Pain Management: Tylenol as needed.   Complaints of left leg aching, worst at night, started after his stroke. Does not feel like gout. Discussed trialing Gabapentin 300mg  TID and patient and wife are agreeable.   11/8: well controlled  4. Mood: Provide emotional support             -antipsychotic agents: N/A 5. Neuropsych: This patient is?  Fully capable of making decisions on his own behalf. 6. Skin/Wound Care: Routine skin checks 7. Fluids/Electrolytes/Nutrition: Routine in and outs d/c IV 8.   Constipation, resume colace 9.  Hyperlipidemia.  Lipitor 10.  Gout.  Colchicine twice daily. Held due to diarrhea, most recently had flare up in foot and RIght elbow   Monitor for any gout flareups  Reasonably controlled , gouty tophi R>L 1st MTP-  No evidence of flare-up 11.  Orthostasis/bradycardia.  ProAmatine 5 mg every 8 hours as well as Florinef 0.1 mg daily.  Monitor with increased mobility.  Follow-up per cardiology services, Order orthostatic vitals  Vitals:   08/04/20 2100 08/05/20 0500  BP: 133/72 110/73  Pulse: (!) 58 67  Resp: 18 18  Temp: 97.6 F (36.4 C) 98.3 F (36.8 C)  SpO2: 98% 100%   Lying to sit ortho BP ok this am   Recurrent orthostasis. Continue midodrine and fludricortisone, changed midorine to 10mg    Increased florinef to 0.15BID at 0800 and 12pm.  No significant orthostatic drop this am  12.  Enterobacter UTI.  resolved 13.  Rectal bleeding.  Follow-up GI services.  Currently no plan for endoscopic studies and monitor hemoglobin hematocrit.   colonoscopy  As  OP  Hemoglobin stable 14.  Spasticity Left hamstring and Left pectoralis which bothers pt at noc  Baclofen changed to Klonopin 0.25mg  qhs may be prn   Botox left pectoralis has reduced tone from MAS 3 to MAS 2, Botox the hamstring has reduced tone MAS 1.  Elbow flexor tone still at MAS 2/3.  Will likely need to repeat as outpatient 16.  Post stroke dysphagia  D2 thins, advance diet as tolerated- pt prefers D1  LOS: 27 days A FACE TO FACE EVALUATION WAS PERFORMED  Charlett Blake 08/05/2020, 7:36 AM

## 2020-08-05 NOTE — Progress Notes (Signed)
Speech Language Pathology Daily Session Note  Patient Details  Name: Tyler Richards MRN: [id number] Date of Birth: 1951-03-22  Today's Date: 08/06/2020 SLP Individual Time: 8938-1017 SLP Individual Time Calculation (min): 57 min  Short Term Goals: Week 4: SLP Short Term Goal 1 (Week 4): Pt will tolerate current diet, demonstrating minimal overt s/sx aspiration and ues of compensatory swallow strategies Mod I. SLP Short Term Goal 2 (Week 4): Patient will be able to perform mildly complex to complex functional tasks with Supervision A for problem solving. SLP Short Term Goal 3 (Week 4): Patient will demonstrate anticipatory awareness related to his deficits during ADL's and discussion regarding guided discharge needs planning with Supervision A SLP Short Term Goal 4 (Week 4): Pt will detect functional errors with Min A verbal and visual cues.  Skilled Therapeutic Interventions: Pt was seen for skilled ST targeting cognitive goals. SLP facilitated session with Min A verbal and visual cues for problem solving, error awareness, and Supervision A for use of external aid to assist with recall within a novel semi-complex card task. Pt also with good recall of PT/OT strategies Mod I, able to verbally problem solve and sequence steps of transfers, as well as describe key safety precautions he and his wife are currently undergoing family training for to prepare for discharge with Supervision A question cues. Pt left sitting in wheelchair with alarm set and needs within reach. Continue per current plan of care.          Pain Pain Assessment Pain Scale: 0-10 Pain Score: 0-No pain  Therapy/Group: Individual Therapy  Arbutus Leas 08/06/2020, 3:08 PM

## 2020-08-05 NOTE — Progress Notes (Signed)
Occupational Therapy Session Note  Patient Details  Name: Tyler Richards MRN: [id number] Date of Birth: [birth year]  Today's Date: 08/05/2020 OT Individual Time: 5093-2671 OT Individual Time Calculation (min): 88 min    Short Term Goals: Week 4:  OT Short Term Goal 1 (Week 4): Pt will donn a pullover shirt with min guard assist following hemi techniques. OT Short Term Goal 2 (Week 4): Pt will complete toilet transfer with min assist stand pivot. OT Short Term Goal 3 (Week 4): Pt will use the LUE at a gross assist level with min facilitation for holding items to be opened with selfcare tasks. OT Short Term Goal 4 (Week 4): Pt will complete LB dressing at min assist level sit to stand except for donning TEDs.  Skilled Therapeutic Interventions/Progress Updates:    Pt completed stand pivot transfer to the therapy mat with mod assist to begin session.  He then transition to sidelying with min assist.  Therapist began with scapular mobilizations of adduction and depression.  He then transitioned to supine for stretching of the shoulder internal rotators and adductors.  Placed the LUE in shoulder abduction with elbow extended and worked on activation of horizontal abduction, with therapist brining the UE back to midline passively and then providing mod facilitation to bring it into abduction.  Also completed shoulder flexion AAROM with mod assist for 1 set of 10 repetitions.  Next, had him work on transition to sitting on the right side with activation of the abdominals and reaching across with the LUE and mod facilitation.  Once in sitting, positioned LUE in slight external rotation on a yoga block while working on lateral weight shifts to the right while reaching with the RUE.. Emphasis on pushing off of the block with the LUE.  Had him work on activation of the left elbow extensors as well to assist with slight lean to the left and transition back to sitting.  Min facilitation needed to complete  transition back to sitting.  Next, had him work on sustained activation of the LUE for pushing a tilted stool forward to target and maintaining with min assist to keep contact with the tilted stool.  Completed work on the LUE by placing the hand on a slanted board and having him work on pushing the hand forward up the board in order to push bean bags off of the end of it.  He needed min facilitation to keep the hand on the board secondary to slight flexor tone as well as for maintaining the hand at midline.  Finished session with mod assist transfer back to the wheelchair and mod assist transfer to the bed. He was able to remove his shoes and his pants with min assist and transfer back to supine at the same level.  Pt left in bed with call button and phone and spouse in room as well.      Therapy Documentation Precautions:  Precautions Precautions: Fall, Other (comment) Precaution Comments: left hemiparesis with left hemi inattention; monitor HR and BP, TEDs and abdominal binder when up Restrictions Weight Bearing Restrictions: No   Pain: Pain Assessment Pain Scale: Faces Pain Score: 0-No pain ADL: See Care Tool Section for some details of mobility and selfcare  Therapy/Group: Individual Therapy  Latonyia Lopata OTR/L 08/05/2020, 3:50 PM

## 2020-08-05 NOTE — Progress Notes (Signed)
Patient ID: Tyler Richards, male   DOB: [birth year], 69 y.o.   MRN: [id number] Team Conference Report to Patient/Family  Team Conference discussion was reviewed with the patient and caregiver, including goals, any changes in plan of care and target discharge date.  Patient and caregiver express understanding and are in agreement.  The patient has a target discharge date of 08/12/20.  [name] 08/05/2020, 12:50 PM

## 2020-08-06 ENCOUNTER — Inpatient Hospital Stay (HOSPITAL_COMMUNITY): Payer: Medicare Other

## 2020-08-06 ENCOUNTER — Inpatient Hospital Stay (HOSPITAL_COMMUNITY): Payer: Medicare Other | Admitting: Occupational Therapy

## 2020-08-06 ENCOUNTER — Inpatient Hospital Stay (HOSPITAL_COMMUNITY): Payer: Medicare Other | Admitting: Speech Pathology

## 2020-08-06 MED ORDER — WITCH HAZEL-GLYCERIN EX PADS
MEDICATED_PAD | CUTANEOUS | Status: DC | PRN
  Filled 2020-08-06: qty 100

## 2020-08-06 MED ORDER — GERHARDT'S BUTT CREAM
TOPICAL_CREAM | Freq: Two times a day (BID) | CUTANEOUS | Status: DC
  Administered 2020-08-08: 1 via TOPICAL
  Filled 2020-08-06: qty 1

## 2020-08-06 MED ORDER — SENNA 8.6 MG PO TABS
2.0000 | ORAL_TABLET | Freq: Every day | ORAL | Status: DC
  Administered 2020-08-06 – 2020-08-11 (×6): 17.2 mg via ORAL
  Filled 2020-08-06 (×6): qty 2

## 2020-08-06 MED ORDER — SENNOSIDES-DOCUSATE SODIUM 8.6-50 MG PO TABS: 1.0000 | ORAL_TABLET | Freq: Two times a day (BID) | ORAL | Status: DC

## 2020-08-06 NOTE — Progress Notes (Signed)
Speech Language Pathology Weekly Progress and Session Note  Patient Details  Name: Tyler Richards MRN: [id number] Date of Birth: 07-Jul-1951  Beginning of progress report period: July 31, 2020 End of progress report period: August 07, 2020  Today's Date: 08/07/2020 SLP Individual Time: 8127-5170 SLP Individual Time Calculation (min): 38 min  Short Term Goals: Week 4: SLP Short Term Goal 1 (Week 4): Pt will tolerate current diet, demonstrating minimal overt s/sx aspiration and ues of compensatory swallow strategies Mod I. SLP Short Term Goal 1 - Progress (Week 4): Met SLP Short Term Goal 2 (Week 4): Patient will be able to perform mildly complex to complex functional tasks with Supervision A for problem solving. SLP Short Term Goal 2 - Progress (Week 4): Progressing toward goal SLP Short Term Goal 3 (Week 4): Patient will demonstrate anticipatory awareness related to his deficits during ADL's and discussion regarding guided discharge needs planning with Supervision A SLP Short Term Goal 3 - Progress (Week 4): Met SLP Short Term Goal 4 (Week 4): Pt will detect functional errors with Min A verbal and visual cues. SLP Short Term Goal 4 - Progress (Week 4): Met    New Short Term Goals: Week 5: SLP Short Term Goal 1 (Week 5): STG=LTG due to remaining length of stay  Weekly Progress Updates: Pt has made functional gains and met 3 out of 4 short term goals. Pt is currently Supervision-Min assist mildly complex tasks due to cognitive impairments impacting his functional problem solving, emergent awareness, short term memory, and left visual inattention. Pt is consuming a puree (dysphagia 1) diet with thin liquids; although from an oropharyngeal standpoint, pt has demonstrated efficient mastication and oral clearance of Dys 2 and Dys 3 textures during trials with SLP, pt prefers the texture of purees and has expressed he would like to stay on this texture diet. ST continues to check in with pt to  offer upgrades as desired. Pt has demonstrated improved recall and carryover of cognitive-linguistic strategies and new/daily information this week, in addition to improved problem solving and visual scanning. Pt and family education is ongoing. Pt would continue to benefit from skilled ST while inpatient in order to maximize functional independence and reduce burden of care prior to discharge. Anticipate that pt will need 24/7 supervision at discharge in addition to Stevens follow up at next level of care.       Intensity: Minumum of 1-2 x/day, 30 to 90 minutes Frequency: 3 to 5 out of 7 days Duration/Length of Stay: 11/17 Treatment/Interventions: Cognitive remediation/compensation;Cueing hierarchy;Dysphagia/aspiration precaution training;Functional tasks;Patient/family education;Internal/external aids;Speech/Language facilitation   Daily Session  Skilled Therapeutic Interventions: Pt was seen for skilled ST targeting cognitive skills and beginning of education with pt's wife. SLP facilitated session with skilled education regarding current diet as well as his potential for advancement up to Dysphagia 3 textures as tolerated. Discussed methods to achieve best pureed consistency of foods, given that that is pt's preference at this time. Also discussed importance of positioning for intake and preference for pt to be out of bed for meals. Pt and wife in agreement and all questions answered to their satisfaction. Pt and wife also communicated continued issues with constipation and SLP provided a overall Min A for verbal problem solving and a visual aid to assist with pt's ability to recall and track H2O intake to assist with digestion and potentially help with constipation. Pt instructed pt to use sheet with pictures of cups, which he can cross off throughout the day  as he drinks cups of water. This should also heighten his awareness of his true efforts to use natural methods such as increasing fluid intake to  assist with digestion and bowels. Pt with increasing pain s/p constipation during session and ultimately he missed 22 mins skills ST due to RN needs to give pt enema. Pt left laying in bed with alarm set and needs within reach. Continue per current plan of care.     Pain Pain Assessment Pain Scale: 0-10 Pain Score: 0-No pain  Therapy/Group: Individual Therapy  Arbutus Leas 08/07/2020, 2:42 PM

## 2020-08-06 NOTE — Plan of Care (Signed)
°  Problem: RH Car Transfers Goal: LTG Patient will perform car transfers with assist (PT) Description: LTG: Patient will perform car transfers with assistance (PT). Flowsheets (Taken 08/06/2020 1556) LTG: Pt will perform car transfers with assist:: (downgraded based on patient progress) Moderate Assistance - Patient 50 - 74% Note: downgraded based on patient progress   Problem: RH Ambulation Goal: LTG Patient will ambulate in controlled environment (PT) Description: LTG: Patient will ambulate in a controlled environment, # of feet with assistance (PT). Flowsheets (Taken 08/06/2020 1556) LTG: Pt will ambulate in controlled environ  assist needed:: Minimal Assistance - Patient > 75% LTG: Ambulation distance in controlled environment: (downgraded based on patient progress) 50 Note: downgraded based on patient progress Goal: LTG Patient will ambulate in home environment (PT) Description: LTG: Patient will ambulate in home environment, # of feet with assistance (PT). Flowsheets (Taken 08/06/2020 1556) LTG: Ambulation distance in home environment: (downgraded based on patient progress) 25 Note: downgraded based on patient progress

## 2020-08-06 NOTE — Progress Notes (Signed)
Pt stated rectal pain and discomfort rated 10/10. Assessed rectum for impaction. Pt required disimpaction for moderate amount of stool and administered dulcolax suppository. Awaiting results at this time. PRN tylenol given for comfort. Skin around anus looked irritated and pink. Wound benefit from butt cream. Applied moisture barrier. Notified Dan, [place]. New orders obtained.   Gerald Stabs, RN

## 2020-08-06 NOTE — Progress Notes (Signed)
Physical Therapy Weekly Progress Note  Patient Details  Name: Tyler Richards MRN: [id number] Date of Birth: [birth year]  Beginning of progress report period: July 31, 2020 End of progress report period: August 06, 2020    Patient has met 2 of 3 short term goals.  Patient continues to make slow progress toward his goals. He remains limited by anxiety, bowel incontinence and other self limiting behaviors. 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 Patient has progressed with directing his care as far as setting up successful transfers and sit <> stand transitions. His wife has begun participating in family training in preparation for dc next week and demonstrates competency with squat pivot transfers. She will benefit from further family ed prior to dc.   Patient continues to demonstrate the following deficits muscle weakness and muscle joint tightness, decreased cardiorespiratoy endurance, impaired timing and sequencing, abnormal tone, unbalanced muscle activation, motor apraxia, decreased coordination and decreased motor planning, decreased visual perceptual skills, decreased midline orientation, decreased attention to left and decreased motor planning, decreased initiation, decreased attention, decreased awareness, decreased problem solving, decreased safety awareness, decreased memory and delayed processing and decreased sitting balance, decreased standing balance, decreased postural control, hemiplegia and decreased balance strategies and therefore will continue to benefit from skilled PT intervention to increase functional independence with mobility.  Patient progressing toward long term goals..  Continue plan of care.  PT Short Term Goals Week 4:  PT Short Term Goal 1 (Week 4): Patient will transfer bed <> wc with MinA consistently PT Short Term Goal 1 - Progress (Week 4): Progressing toward goal PT  Short Term Goal 2 (Week 4): Patient will ambulate >3f with MinA and LRAD, wc follow if needed PT Short Term Goal 2 - Progress (Week 4): Met PT Short Term Goal 3 (Week 4): Patient will propel wc >574fwith supervision PT Short Term Goal 3 - Progress (Week 4): Met Week 5:  PT Short Term Goal 1 (Week 5): STG= LTG due to ELOS  Skilled Therapeutic Interventions/Progress Updates:  Ambulation/gait training;Balance/vestibular training;Cognitive remediation/compensation;Community reintegration;Discharge planning;Disease management/prevention;DME/adaptive equipment instruction;Functional electrical stimulation;Functional mobility training;Neuromuscular re-education;Pain management;Patient/family education;Psychosocial support;Skin care/wound management;Splinting/orthotics;Stair training;Therapeutic Activities;Therapeutic Exercise;UE/LE Strength taining/ROM;UE/LE Coordination activities;Visual/perceptual remediation/compensation;Wheelchair propulsion/positioning   Therapy Documentation Precautions:  Precautions Precautions: Fall, Other (comment) Precaution Comments: left hemiparesis with left hemi inattention; monitor HR and BP, TEDs and abdominal binder when up Restrictions Weight Bearing Restrictions: No   JeDebbora Dus1/07/2020, 12:59 PM

## 2020-08-06 NOTE — Progress Notes (Signed)
   08/06/20 2014  Assess: MEWS Score  Temp 98.8 F (37.1 C)  BP (!) 84/49  Pulse Rate (!) 41  Resp 18  Level of Consciousness Alert  SpO2 98 %  O2 Device Room Air  Assess: MEWS Score  MEWS Temp 0  MEWS Systolic 1  MEWS Pulse 1  MEWS RR 0  MEWS LOC 0  MEWS Score 2  MEWS Score Color Yellow  Assess: if the MEWS score is Yellow or Red  Were vital signs taken at a resting state? Yes  Focused Assessment No change from prior assessment  Early Detection of Sepsis Score *See Row Information* Low  MEWS guidelines implemented *See Row Information* No, vital signs rechecked  Notify: Charge Nurse/RN  Name of Charge Nurse/RN Notified Jessica, RN  Date Charge Nurse/RN Notified 08/06/20  Time Charge Nurse/RN Notified 2014  Document  Patient Outcome Other (Comment);Stabilized after interventions  Progress note created (see row info) Yes    Vitals rechecked by RN. SBP >29mm Hg difference. Discussed with charge RN Janett Billow. Initial BP likely error. Mews score now 0/green.

## 2020-08-06 NOTE — Progress Notes (Signed)
Occupational Therapy Weekly Progress Note  Patient Details  Name: Tyler Richards MRN: [id number] Date of Birth: 1951/09/13  Beginning of progress report period: July 31, 2020 End of progress report period: August 06, 2020  Today's Date: 08/06/2020 OT Individual Time: 7948-0165 OT Individual Time Calculation (min): 59 min    Patient has met 1 of 4 short term goals.  Pt is making steady progress with OT.  At this time, he continues to complete UB selfcare with min assist and LB bathing at min assist sit to stand.  He needs mod assist for LB dressing sit to stand and mod assist for stand pivot transfers to the toilet or walk-in shower.  Squat pivot transfers are min assist as well.  LUE functional use is still limited at a Brunnstrum stage III level in the arm and hand.  Increased tone is still present in the internal rotators, finger flexors, and elbow flexors, but at a much less intense rate than last week.  He continues to need mod assist to use as a stabilizer when holding items to be opened or max assist to use it for washing the RUE.  He continues to need mod instructional cueing for hemi dressing techniques with decreased carryover from session to session.  Left inattention is still present slightly, but he is able to compensate and locate items left of midline with no more than min instructional cueing.  Slight pushing to the left is also present, especially with transitional movements such as sit to stand.  Dynamic sitting balance is still min assist as well as when he is reaching down toward his LLE or crossing one LE over the other, he sometimes losses his balance to the left.   Feel he is on target for planned discharge on 11/17.  Will begin family education with spouse next session in preparation for this.    Patient continues to demonstrate the following deficits: muscle weakness and muscle paralysis, impaired timing and sequencing, abnormal tone, unbalanced muscle activation and  decreased coordination, decreased midline orientation and decreased attention to left, decreased awareness, decreased problem solving and decreased memory and decreased sitting balance, decreased standing balance, hemiplegia and decreased balance strategies and therefore will continue to benefit from skilled OT intervention to enhance overall performance with BADL and Reduce care partner burden.  Patient progressing toward long term goals..  Continue plan of care.  OT Short Term Goals Week 5:  OT Short Term Goal 1 (Week 5): Continue working on established LTGs set at min assist overall.  Skilled Therapeutic Interventions/Progress Updates:    Pt worked on shower and dressing during session.  He was able to transfer to sitting from supine with min assist and then ambulated to the shower at mod assist level with no device.  Increased lean/pushing to the left with erratic step length on the left as well.  Once in the bathroom he doffed his pants and brief with mod assist.  He was able to remove his gripper socks with supervision and therapist assisted with TEDs.  He completed all bathing with overall min assist sit to stand.  He needed mod instructional cueing for sequencing and integration of the LUE as a stabilizer for holding the soap when removing the top and for pouring it on the washcloth.  He was able to complete washing of the RUE with use of the left and max hand over hand assist.  Min assist for standing to wash his buttocks, with therapist helping to rinse off secondary  to being in close contact.  He transferred to the wheelchair with min assist squat pivot and then was positioned at the sink for dressing tasks.  Mod demonstrational cueing for orientation and sequencing of donning brief and pants, as pt has trouble with placing his LEs through the waistband opening and through the appropriate leg.  He needed min assist for sit to stand when pulling items over his hips.  He was able to complete donning  pullover with min assist, but needed cueing for orientation and technique as stated before.  Therapist provided total assist for TEDs and then he was able to donn his shoes on his feet with min assist.  Max assist was needed for tying them.  Finished session with pt in the wheelchair and with the call button and phone in reach.  Safety belt in place as well.    Therapy Documentation Precautions:  Precautions Precautions: Fall, Other (comment) Precaution Comments: left hemiparesis with left hemi inattention; monitor HR and BP, TEDs and abdominal binder when up Restrictions Weight Bearing Restrictions: No  Pain: Pain Assessment Pain Scale: Faces Pain Score: 0-No pain ADL: See Care Tool Section for some details of mobility and selfcare  Therapy/Group: Individual Therapy  MCGUIRE,JAMES OTR/L 08/06/2020, 12:38 PM  

## 2020-08-06 NOTE — Progress Notes (Signed)
[place] PHYSICAL MEDICINE & REHABILITATION PROGRESS NOTE   Subjective/Complaints: Complaining of butt pain- tried to have a BM and couldn't, was very painful, feels constipated. Butt cream and witch hazel pads ordered Orthostasis has greatly improved.   ROS: Patient denies NVD, CP, SOB  Objective:   DG Abd 1 View  Result Date: 08/04/2020 CLINICAL DATA:  Constipation EXAM: ABDOMEN - 1 VIEW COMPARISON:  None. FINDINGS: Two supine frontal views of the abdomen and pelvis demonstrate marked retained stool throughout the colon compatible with given history of constipation. No bowel obstruction or ileus. No masses or abnormal calcifications. Vertical lucency overlying the right hemiabdomen likely reflects a skin fold. IMPRESSION: 1. Moderate fecal retention consistent with constipation. No evidence of obstruction. Electronically Signed   By: Randa Ngo M.D.   On: 08/04/2020 19:32   No results for input(s): WBC, HGB, HCT, PLT in the last 72 hours. No results for input(s): NA, K, CL, CO2, GLUCOSE, BUN, CREATININE, CALCIUM in the last 72 hours.  Intake/Output Summary (Last 24 hours) at 08/06/2020 1012 Last data filed at 08/06/2020 0900 Gross per 24 hour  Intake 440 ml  Output 450 ml  Net -10 ml        Physical Exam: Vital Signs Blood pressure 116/64, pulse (!) 50, temperature 97.8 F (36.6 C), temperature source Oral, resp. rate 18, height 6' (1.829 m), weight 74.5 kg, SpO2 98 %.  General: Alert, No apparent distress HEENT: Head is normocephalic, atraumatic, PERRLA, EOMI, sclera anicteric, oral mucosa pink and moist, dentition intact, ext ear canals clear,  Neck: Supple without JVD or lymphadenopathy Heart: Bradycardic. No murmurs rubs or gallops Chest: CTA bilaterally without wheezes, rales, or rhonchi; no distress Abdomen: Soft, non-tender, non-distended, bowel sounds positive. Extremities: No clubbing, cyanosis, or edema. 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 Functional deficits secondary to Right MCA infarct which require 3+ hours per day of interdisciplinary therapy in a comprehensive inpatient rehab setting.  Physiatrist is providing close team supervision and 24 hour management of active medical problems listed below.  Physiatrist and rehab team continue to assess barriers to discharge/monitor patient progress toward functional and medical goals  Care Tool:  Bathing    Body parts bathed by patient: Left arm, Chest, Abdomen, Right upper leg, Left upper leg, Right lower leg, Face, Front perineal area, Left lower leg, Buttocks   Body parts bathed by helper: Right arm Body parts n/a: Buttocks   Bathing assist Assist Level: Minimal Assistance - Patient > 75%     Upper Body Dressing/Undressing Upper body dressing   What is the patient wearing?: Pull over shirt    Upper body assist Assist Level: Minimal Assistance - Patient > 75%    Lower Body Dressing/Undressing Lower body dressing      What is the patient wearing?: Incontinence brief, Pants     Lower body assist Assist for lower body dressing: Moderate Assistance - Patient 50 - 74%     Toileting Toileting    Toileting assist Assist for toileting: Moderate Assistance - Patient 50 - 74%     Transfers Chair/bed transfer  Transfers assist     Chair/bed transfer assist level: Moderate Assistance - Patient 50 - 74% (stand pivot)  Locomotion Ambulation   Ambulation assist   Ambulation activity did not occur: Safety/medical concerns (required skilled intervention to ambulate 27ft at hallway rail)  Assist level: Minimal Assistance - Patient > 75% Assistive device: Hand held assist Max  distance: 25   Walk 10 feet activity   Assist  Walk 10 feet activity did not occur: Safety/medical concerns  Assist level: Minimal Assistance - Patient > 75% Assistive device: Hand held assist   Walk 50 feet activity   Assist Walk 50 feet with 2 turns activity did not occur: Safety/medical concerns  Assist level: Moderate Assistance - Patient - 50 - 74% Assistive device: Hand held assist    Walk 150 feet activity   Assist Walk 150 feet activity did not occur: Safety/medical concerns  Assist level: 2 helpers Assistive device: Other (comment) (3 Musketeer)    Walk 10 feet on uneven surface  activity   Assist Walk 10 feet on uneven surfaces activity did not occur: Safety/medical concerns         Wheelchair     Assist Will patient use wheelchair at discharge?: Yes Type of Wheelchair: Manual    Wheelchair assist level: Supervision/Verbal cueing Max wheelchair distance: 100    Wheelchair 50 feet with 2 turns activity    Assist        Assist Level: Supervision/Verbal cueing   Wheelchair 150 feet activity     Assist      Assist Level: Moderate Assistance - Patient 50 - 74%   Medical Problem List and Plan: 1.  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  Continue CIR PT, OT, SLP  2.  Antithrombotics: -DVT/anticoagulation: Venous Doppler studies negative.  SCDs.  No heparin given hemorrhage.             -antiplatelet therapy: Aspirin 81 mg daily, currently off of Brilinta given GIB and ICA has reoccluded 3. Pain Management: Tylenol as needed.   Complaints of left leg aching, worst at night, started after his stroke. Does not feel like gout. Discussed trialing Gabapentin 300mg  TID and patient and wife are agreeable.   11/8: well controlled   11/11: Worst pain this morning is near rectum-  erythematous, butt cream and with hazel pads ordered.  4. Mood: Provide emotional support             -antipsychotic agents: N/A 5. Neuropsych: This patient is?  Fully capable of making decisions on his own behalf. 6. Skin/Wound Care: Routine skin checks 7. Fluids/Electrolytes/Nutrition: Routine in and outs d/c IV 8.  Constipation, resume colace, start senna HS as well. KUB shows constipation. 9.  Hyperlipidemia.  Lipitor 10.  Gout.  Colchicine twice daily. Held due to diarrhea, most recently had flare up in foot and RIght elbow   Monitor for any gout flareups  Reasonably controlled , gouty tophi R>L 1st MTP-  No evidence of flare-up 11.  Orthostasis/bradycardia.  ProAmatine 5 mg every 8 hours as well as Florinef 0.1 mg daily.  Monitor with increased mobility.  Follow-up per cardiology services, Order orthostatic vitals  Vitals:   08/05/20 1916 08/06/20 0433  BP: 98/67 116/64  Pulse: (!) 53 (!) 50  Resp:  18  Temp: 97.8 F (36.6 C) 97.8 F (36.6 C)  SpO2: 99% 98%   Lying to sit ortho BP ok this am   Recurrent orthostasis. Continue midodrine and fludricortisone, changed midorine to  10mg    Increased florinef to 0.15BID at 0800 and 12pm.  11/11: much improved 12.  Enterobacter UTI.  resolved 13.  Rectal bleeding.  Follow-up GI services.  Currently no plan for endoscopic studies and monitor hemoglobin hematocrit.   colonoscopy  As OP  Hemoglobin stable 14.  Spasticity Left hamstring and Left pectoralis which bothers pt at noc  Baclofen changed to Klonopin 0.25mg  qhs may be prn   Botox left pectoralis has reduced tone from MAS 3 to MAS 2, Botox the hamstring has reduced tone MAS 1.  Elbow flexor tone still at MAS 2/3.  Will likely need to repeat as outpatient 16.  Post stroke dysphagia  D2 thins, advance diet as tolerated- pt prefers D1  LOS: 28 days A FACE TO FACE EVALUATION WAS PERFORMED  Tyler Richards P Tyler Richards 08/06/2020, 10:12 AM

## 2020-08-06 NOTE — Progress Notes (Signed)
Physical Therapy Session Note  Patient Details  Name: Tyler Richards MRN: [id number] Date of Birth: 1951/04/29  Today's Date: 08/06/2020 PT Individual Time: 0900-1000 PT Individual Time Calculation (min): 60 min   Short Term Goals: Week 4:  PT Short Term Goal 1 (Week 4): Patient will transfer bed <> wc with MinA consistently PT Short Term Goal 2 (Week 4): Patient will ambulate >18ft with MinA and LRAD, wc follow if needed PT Short Term Goal 3 (Week 4): Patient will propel wc >82ft with supervision  Skilled Therapeutic Interventions/Progress Updates:    Patient received in bed, RN present completing bowel care. Wife at bedside. Patient agreeable to PT after completing bowel care, but reports increased anxiety. Patient requiring increased assistance today to complete bed mobility, MinA, to come to sit edge of bed. Patient relates this to his anxiety. [place] sit <> stand at bedside to pants. Patient becoming very frustrated and agitated that this PT was encouraging him to complete this task himself to the best of his ability. Patient with uncontrolled descent back onto bed out of frustration. PT explaining to patient that that's not safe regardless of if he's frustrated or not. ModA squat pivot to wc after PT completed donning patients pants. Wife agreeable to initiating family education on transfers to/from therapy mat. PT educated patient and wife on proper set up for safe transfer, hand placement, use of gait belt, and methods to facilitate safe transfer. PT demonstrated squat pivot transfer and then wife was able to demonstrate safe squat pivot transfer leading R as well. Wife demonstrating proper body mechanics for this transfer. Though she did initially attempt to complete transfer by pulling on patients arms, PT was able to redirect wife to use gait belt appropriately. Patient and wife then completing squat pivot leading L. Discussed with wife if patient starts pushing/blocking transfer with R UE, to  have him put his arm in his lap for safe transfer. Patient returning to room, transferring to toilet via stand pivot with [place]. He was able to have a small bowel movement. Transferred back to wc via stand pivot with ModA and to bed via squat pivot with ModA. Patient remaining in bed, bed alarm on, call light within reach, wife at bedside. Wife agreeable to further education prior to dc.   Therapy Documentation Precautions:  Precautions Precautions: Fall, Other (comment) Precaution Comments: left hemiparesis with left hemi inattention; monitor HR and BP, TEDs and abdominal binder when up Restrictions Weight Bearing Restrictions: No    Therapy/Group: Individual Therapy  Karoline Caldwell, PT, DPT, CBIS  08/06/2020, 7:31 AM

## 2020-08-07 ENCOUNTER — Inpatient Hospital Stay (HOSPITAL_COMMUNITY): Payer: Medicare Other

## 2020-08-07 ENCOUNTER — Inpatient Hospital Stay (HOSPITAL_COMMUNITY): Payer: Medicare Other | Admitting: Speech Pathology

## 2020-08-07 ENCOUNTER — Inpatient Hospital Stay (HOSPITAL_COMMUNITY): Payer: Medicare Other | Admitting: Occupational Therapy

## 2020-08-07 DIAGNOSIS — L8990 Pressure ulcer of unspecified site, unspecified stage: Secondary | ICD-10-CM | POA: Insufficient documentation

## 2020-08-07 NOTE — Plan of Care (Signed)
  Problem: RH Problem Solving Goal: LTG Patient will demonstrate problem solving for (SLP) Description: LTG:  Patient will demonstrate problem solving for basic/complex daily situations with cues  (SLP) Flowsheets (Taken 08/07/2020 1449) LTG: Patient will demonstrate problem solving for (SLP): Basic daily situations Note: Complexity of problem solving goal downgraded due to less than expected progress with complex tasks   Problem: RH Awareness Goal: LTG: Patient will demonstrate awareness during functional activites type of (SLP) Description: LTG: Patient will demonstrate awareness during functional activites type of (SLP) Flowsheets (Taken 08/07/2020 1449) LTG: Patient will demonstrate awareness during cognitive/linguistic activities with assistance of (SLP): Minimal Assistance - Patient > 75% Note: Downgraded due to slower than anticipated progress

## 2020-08-07 NOTE — Progress Notes (Signed)
Occupational Therapy Session Note  Patient Details  Name: Tyler Richards MRN: [id number] Date of Birth: 1951/01/28  Today's Date: 08/07/2020 OT Individual Time: 0800-0900 OT Individual Time Calculation (min): 60 min    Short Term Goals: Week 5:  OT Short Term Goal 1 (Week 5): Continue working on established LTGs set at min assist overall.  Skilled Therapeutic Interventions/Progress Updates:    Pt in bed to start session with spouse present.  He was able to transfer to the EOB with supervision to work on LB dressing.  Began education with spouse on sit to stand for pulling garments over his hips.  He was able to thread his pants and then needed mod assist for pulling them over his hips with spouse assisting.  He was able to donn his shoes with mod assist as well but needed total assist for tying them.  After completion of task, he stated he needed to use the bathroom and functional mobility was completed with mod hand held assist.  Decreased consistency with clearing of the LLE with pt taking too big of a step at times as well as dragging his toe.  He was able to complete toilet hygiene and clothing management with mod assist sit to stand.  Had wife assist with sit to stand as well for clothing management.  Once he ambulated back out to the wheelchair and sat down, he complained of pain in his rectum and the need to go back to the bathroom.  This continued to occur throughout the rest of the session with pt ambulating to and from the toilet and having very minimal BMs.  At conclusion he was asked to transfer back to the bed and completed transfer with min assist.  He was left in bed with spouse present.  Discussed situation with nursing as well.  Will continue with family education hands on with spouse next few OT sessions.    Therapy Documentation Precautions:  Precautions Precautions: Fall, Other (comment) Precaution Comments: left hemiparesis with left hemi inattention; monitor HR and BP, TEDs  and abdominal binder when up Restrictions Weight Bearing Restrictions: No  Pain: Pain Assessment Pain Scale: Faces Pain Score: 0-No pain Faces Pain Scale: Hurts little more Pain Type: Acute pain Pain Location: Buttocks Pain Descriptors / Indicators: Discomfort Pain Onset: On-going Pain Intervention(s): Repositioned ADL: See Care Tool Section for some details of mobility and selfcare  Therapy/Group: Individual Therapy  Declyn Offield OTR/L 08/07/2020, 12:11 PM

## 2020-08-07 NOTE — Progress Notes (Signed)
[place] PHYSICAL MEDICINE & REHABILITATION PROGRESS NOTE   Subjective/Complaints: Rectal pain/soreness has improved.  Orthostasis has improved. Jeneen Rinks OT asks about plan for abdominal binder outpatient Constipation has improved- having BM now,  ROS: Patient denies NVD, CP, SOB  Objective:   No results found. No results for input(s): WBC, HGB, HCT, PLT in the last 72 hours. No results for input(s): NA, K, CL, CO2, GLUCOSE, BUN, CREATININE, CALCIUM in the last 72 hours.  Intake/Output Summary (Last 24 hours) at 08/07/2020 1043 Last data filed at 08/07/2020 0900 Gross per 24 hour  Intake 240 ml  Output 200 ml  Net 40 ml     Pressure Injury 08/06/20 Coccyx Mid Stage 1 -  Intact skin with non-blanchable redness of a localized area usually over a bony prominence. (Active)  08/06/20 0845  Location: Coccyx  Location Orientation: Mid  Staging: Stage 1 -  Intact skin with non-blanchable redness of a localized area usually over a bony prominence.  Wound Description (Comments):   Present on Admission:     Physical Exam: Vital Signs Blood pressure 110/64, pulse (!) 49, temperature 98.6 F (37 C), temperature source Oral, resp. rate 18, height 6' (1.829 m), weight 74.5 kg, SpO2 97 %.   General: Alert and oriented x 3, No apparent distress HEENT: Head is normocephalic, atraumatic, PERRLA, EOMI, sclera anicteric, oral mucosa pink and moist, dentition intact, ext ear canals clear,  Neck: Supple without JVD or lymphadenopathy Heart: Reg rate and rhythm. No murmurs rubs or gallops Chest: CTA bilaterally without wheezes, rales, or rhonchi; no distress Abdomen: Soft, non-tender, non-distended, bowel sounds positive. Extremities: No clubbing, cyanosis, or edema. 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 Functional deficits secondary to Right MCA infarct which require 3+ hours per day of interdisciplinary therapy in a comprehensive inpatient rehab setting.  Physiatrist is providing close team supervision and 24 hour management of active medical problems listed below.  Physiatrist and rehab team continue to assess barriers to discharge/monitor patient progress toward functional and medical goals  Care Tool:  Bathing    Body parts bathed by patient: Left arm, Chest, Abdomen, Right upper leg, Left upper leg, Right lower leg, Face, Front perineal area, Left lower leg, Buttocks, Right arm   Body parts bathed by helper: Right arm Body parts n/a: Buttocks   Bathing assist Assist Level: Minimal Assistance - Patient > 75%     Upper Body Dressing/Undressing Upper body dressing   What is the patient wearing?: Pull over shirt    Upper body assist Assist Level: Minimal Assistance - Patient > 75%    Lower Body Dressing/Undressing Lower body dressing      What is the patient wearing?: Incontinence brief, Pants     Lower body assist Assist for lower body dressing: Moderate Assistance - Patient 50 - 74%     Toileting Toileting    Toileting assist Assist for toileting: Moderate Assistance - Patient 50 - 74%     Transfers Chair/bed transfer  Transfers assist     Chair/bed transfer assist level: Moderate Assistance - Patient 50 - 74%     Locomotion Ambulation   Ambulation assist   Ambulation activity did not occur: Safety/medical concerns (required skilled intervention  to ambulate 64ft at hallway rail)  Assist level: Moderate Assistance - Patient 50 - 74% Assistive device: Hand held assist Max distance: 10'   Walk 10 feet activity   Assist  Walk 10 feet activity did not occur: Safety/medical concerns  Assist level: Minimal Assistance - Patient > 75% Assistive device:  Hand held assist   Walk 50 feet activity   Assist Walk 50 feet with 2 turns activity did not occur: Safety/medical concerns  Assist level: Moderate Assistance - Patient - 50 - 74% Assistive device: Hand held assist    Walk 150 feet activity   Assist Walk 150 feet activity did not occur: Safety/medical concerns  Assist level: 2 helpers Assistive device: Other (comment) (3 [place]ket[place])    Walk 10 feet on uneven surface  activity   Assist Walk 10 feet on uneven surfaces activity did not occur: Safety/medical concerns         Wheelchair     Assist Will patient use wheelchair at discharge?: Yes Type of Wheelchair: Manual    Wheelchair assist level: Supervision/Verbal cueing Max wheelchair distance: 100    Wheelchair 50 feet with 2 turns activity    Assist        Assist Level: Supervision/Verbal cueing   Wheelchair 150 feet activity     Assist      Assist Level: Moderate Assistance - Patient 50 - 74%   Medical Problem List and Plan: 1.  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  Continue CIR PT, OT, SLP  2.  Antithrombotics: -DVT/anticoagulation: Venous Doppler studies negative.  SCDs.  No heparin given hemorrhage.             -antiplatelet therapy: Aspirin 81 mg daily, currently off of Brilinta given GIB and ICA has reoccluded 3. Pain Management: Tylenol as needed.   Complaints of left leg aching, worst at night, started after his stroke. Does not feel like gout. Discussed trialing Gabapentin 300mg  TID and patient and wife are agreeable.   11/8: well controlled   11/11: Worst pain this morning is near rectum- erythematous, butt cream and with hazel pads ordered.   11/12: Pain has improved with above interventions 4. Mood: Provide emotional support             -antipsychotic agents: N/A 5.  Neuropsych: This patient is?  Fully capable of making decisions on his own behalf. 6. Skin/Wound Care: Routine skin checks 7. Fluids/Electrolytes/Nutrition: Routine in and outs d/c IV 8.  Constipation, resume colace, start senna HS as well. KUB shows constipation.  11/12: Able to have BM with above regimen. 9.  Hyperlipidemia.  Lipitor 10.  Gout.  Colchicine twice daily. Held due to diarrhea, most recently had flare up in foot and RIght elbow   Monitor for any gout flareups  Reasonably controlled , gouty tophi R>L 1st MTP-  No evidence of flare-up 11.  Orthostasis/bradycardia.  ProAmatine 5 mg every 8 hours as well as Florinef 0.1 mg daily.  Monitor with increased mobility.  Follow-up per cardiology services, Order orthostatic vitals  Vitals:   08/06/20 2049 08/07/20 0453  BP: 107/62 110/64  Pulse: 62 (!) 49  Resp: 18 18  Temp: 98.2 F (36.8 C) 98.6 F (37 C)  SpO2: 97% 97%   Lying to sit ortho BP ok this am   Recurrent orthostasis. Continue midodrine and fludricortisone, changed midorine to 10mg   Increased florinef to 0.15BID at 0800 and 12pm.  11/11-12: much improved, advised with continue to require abdominal binder at home while weaning off florinef and midodrine 12.  Enterobacter UTI.  resolved 13.  Rectal bleeding.  Follow-up GI services.  Currently no plan for endoscopic studies and monitor hemoglobin hematocrit.   colonoscopy  As OP  Hemoglobin stable 14.  Spasticity Left hamstring and Left pectoralis which bothers pt at noc  Baclofen changed to Klonopin 0.25mg  qhs may be prn   Botox left pectoralis has reduced tone from MAS 3 to MAS 2, Botox the hamstring has reduced tone MAS 1.  Elbow flexor tone still at MAS 2/3.  Will likely need to repeat as outpatient 16.  Post stroke dysphagia  D2 thins, advance diet as tolerated- pt prefers D1  LOS: 29 days A FACE TO FACE EVALUATION WAS PERFORMED  Martha Clan P Tessy Pawelski 08/07/2020, 10:43 AM

## 2020-08-07 NOTE — Progress Notes (Signed)
Physical Therapy Session Note  Patient Details  Name: Tyler Richards MRN: [id number] Date of Birth: July 08, 1951  Today's Date: 08/07/2020 PT Individual Time: 0930-1041 PT Individual Time Calculation (min): 71 min   Short Term Goals: Week 5:  PT Short Term Goal 1 (Week 5): STG= LTG due to ELOS  Skilled Therapeutic Interventions/Progress Updates:    Patient received supine in bed complaining of pain in his rectum, describing it as though he's "being ripped in half." Patient received pain rx this morning. PT educating patient on proper movement to assist with bowel movement, deep breathing techniques and options to take his mind away from his current pain/discomfort. Patient requesting to use bathroom before therapy session. He was able to ambulate ~81ft into bathroom with ModA x1 HHA, but 2nd assist needed to doff pants in standing as patient became too anxious about his bowels to complete this task safely himself. Discussed safety with patient and importance of remaining calm when he feels anxious to prevent him from falling/ losing his balance due to increasing anxiety. Patient and wife verbalized understanding. He was unable to have a bowel movement in the bathroom. He ambulated back to his bed with HHA ModAx1. Patient requesting to return supine due to rectal pain. PT then discussed with RN plan for pain management and bowel care. RN stating that patient will receive suppository after therapy session. Patient and wife verbalized understanding of plan and were then agreeable to participate in therapy/family education. He transferred to wc via Stand pivot with ModA. PT propelled patient in wc to therapy gym for time management and energy conservation. Wife able to demonstrate squat pivot transfers leading B x4 each way with safe body mechanics and verbal cues from PT to ensure safety. Wife then instructed on safe sit <> stand transfers with patient- standing on L side, anticipating L lateral lean and how  to best support patient, but allow him to complete as much on his own as he can. Wife initially standing very close to patient throwing both off balance with this transition. Sit <> stand completed very quickly as well. PT instructing wife to give patient more space to allow him to redistribute his weight in standing as well as slow down the transfer. Patient agreeable to trialing gait tx with wife. He ambulated x5 with with PT, HHA and ModA. Wife then guarding patient on L with PT on R for safety. Halfway through 45ft bout of gait, patient stopped and began complaining of pain in his rectum. Due to this distraction, patient began losing his balance requiring increased assist from both PT and wife to safely return patient to mat table. Discussed with patient and wife about concentrating on single task at a time and addressing pain/toileting once he's in a safe location. Patient and wife verbalized understanding. Patient returned to bed via squat pivot with MinA. Bed alarm on, call light within reach, wife at bedside.   Therapy Documentation Precautions:  Precautions Precautions: Fall, Other (comment) Precaution Comments: left hemiparesis with left hemi inattention; monitor HR and BP, TEDs and abdominal binder when up Restrictions Weight Bearing Restrictions: No    Therapy/Group: Individual Therapy  Karoline Caldwell, PT, DPT, CBIS  08/07/2020, 7:38 AM

## 2020-08-08 ENCOUNTER — Inpatient Hospital Stay (HOSPITAL_COMMUNITY): Payer: Medicare Other | Admitting: Physical Therapy

## 2020-08-08 ENCOUNTER — Inpatient Hospital Stay (HOSPITAL_COMMUNITY): Payer: Medicare Other | Admitting: Occupational Therapy

## 2020-08-08 ENCOUNTER — Inpatient Hospital Stay (HOSPITAL_COMMUNITY): Payer: Medicare Other

## 2020-08-08 NOTE — Progress Notes (Signed)
Physical Therapy Session Note  Patient Details  Name: Tyler Richards MRN: [id number] Date of Birth: 1951/07/22  Today's Date: 08/08/2020 PT Individual Time: 2376-2831 and 5176-1607 PT Individual Time Calculation (min): 63 min and 44 min   Short Term Goals: Week 5:  PT Short Term Goal 1 (Week 5): STG= LTG due to ELOS  Skilled Therapeutic Interventions/Progress Updates:    Session 1: Pt received supine in bed with his wife present and pt/family agreeable to therapy session with focus on continued hands-on training and education. Therapist allowed pt's wife to direct pt's care and set-up for all mobility tasks with pt's wife providing hands-on physical assistance at all times unless otherwise stated. Supine on bed donned B LE thigh high TED hose max assist. Supine>sitting R EOB, HOB partially elevated and relying heavily on bedrail with supervision. [name] wife reports MD has recommended pt wearing B LE thigh high TED hose and abdominal binder with all OOB mobility - wife donned without assist. Sitting EOB with pt's wife providing proper guarding on pt's L side - donned shirt, pants, and shoes set-up assist (assist to tie shoes) - pt noted to have most difficulty managing shirt as he was unable to feel/determine when his L UE was inside the shirt. Sit<>stand to/from EOB with pt's wife providing strong min assist for balance while pulling up pants max assist. Assessed vitals sitting EOB: BP 99/65 (MAP 75), HR 55bpm, no symptoms reported. Initiated R stand pivot EOB>w/c but once in standing pt/wife looked unsteady therefore cued them to have pt sit back down on EOB. They transitioned to squat pivot technique demonstrating good safety awareness and reassessment of situation without cuing. R squat pivot EOB>w/c with min assist for lifting/pivoting hips - pt able to utilize R UE on w/c arm rest to assist with pulling hips towards w/c. Therapist educated on w/c part management. L squat pivot w/c>EOB with therapist  providing max cuing and demonstration on proper positioning for assistance as pt/wife having increased difficulty transferring this direction - pt performed with wife providing min assist. Therapist educated pt/family on importance of utilizing bed features to allow transfers downhill to a flat surface to increase pt safety and decrease caregiver burden. Transferred back to w/c via R squat pivot again with pt's wife providing min assist. Therapist reinforced key components of education/training at end of session. Pt left seated in w/c with needs in reach and his wife present.  Session 2: Pt received supine in bed with his wife present and pt/family agreeable to continue hands-on training/education. [name] wife provided all hands-on assistance unless otherwise stated. Pt already wearing B LE thigh high TED hose. Supine>sitting R EOB with HOB partially elevated and relying heavily on bedrail with supervision. While sitting EOB, initiated donning shoes but then pt had sudden onset of severe rectal pain continuing to report it feels like it is "ripping me apart" causing him to return to supine due to the severity of the pain. Pt/wife become emotional regarding the struggles of battling diarrhea vs hard stool/constipation - therapist providing emotional support and continues to reinforce importance of sitting upright on BSC to allow improved pelvic alignment to promote passing of bowels. Pt reports feeling that he could have BM and agreeable to transfer to [facility]. Sitting EOB therapist donned abdominal binder and gait belt for time management. L squat pivot EOB>BSC with pt's wife providing mod assist and therapist providing min assist and max cuing for head/hips relationship and proper positioning of wife to decrease caregiver burden  and improve pt independence and safety. Therapist recommended having pt stand holding onto bedrail with R UE for increased stability while pt's wife performed total assist LB clothing  management. Sitting on BSC pt able to void 1 small droplet of BM - pt stood with heavy min assist from his wife while using R UE support on bedrail - therapist completed total assist peri-care because pt's wife was unable to fully reach around pt to complete - pt noted to have hard stool near surface of rectum but unable to pass at this time despite increased time sitting on BSC. Therapist spoke with NT to schedule another trial of timed toileting in 1 hour to allow pt another attempt to void. R squat pivot BSC>EOB with pt's wife providing min assist and therapist again providing max cuing for proper head/hips relationship and positioning of pt's wife to provide assist. Sit>supine with min assist for B LE management onto bed. Pt left supine in bed with needs in reach and bed alarm on.    Therapy Documentation Precautions:  Precautions Precautions: Fall, Other (comment) Precaution Comments: left hemiparesis with left hemi inattention; monitor HR and BP, TEDs and abdominal binder when up Restrictions Weight Bearing Restrictions: No  Pain: Session 1: At beginning of session pt reports he is feeling better today compared to yesterday; however, as the session progressed pt started to have sudden onset of severe rectal pain, which pt affiliates with gas - provided rest breaks, repositioning, distraction, and emotional support for pain management.  Session 2: Has repeated, sudden onset of severe rectal pain with pt reporting it feels like it is "ripping me apart" causing him to lie back down due to severity of the pain - therapist provided assist for repositioning for pain management and assisted pt with attempted toileting - reinforced education on importance of getting to [facility] to void as opposed to using bedpan for improved pelvic alignment.  Therapy/Group: Individual Therapy  Tawana Scale , PT, DPT, CSRS  08/08/2020, 7:49 AM

## 2020-08-08 NOTE — Progress Notes (Signed)
Speech Language Pathology Daily Session Note  Patient Details  Name: Tyler Richards MRN: [id number] Date of Birth: 1951/01/25  Today's Date: 08/08/2020 SLP Individual Time: 6073-7106 SLP Individual Time Calculation (min): 43 min  Short Term Goals: Week 5: SLP Short Term Goal 1 (Week 5): STG=LTG due to remaining length of stay  Skilled Therapeutic Interventions: Skilled therapeutic intervention focused on awareness of errors with cognitive tasks and speech intelligibility. Pt reported all of his friends are able to understand him on the phone but stated  he still feels his speech is slurred. Reviewed intelligiblity strategies to use in conversation. Pt was 90% intelligible this session. Calendar problem solving task completed with 70% accuracy and min A with verbal cues to reason the relevant information that should be included when entering upcoming events and appointments. Pt stated he is pleased with diet and declined upgraded to minced/moist consistency. Cont with therapy per plan of care.      Pain Pain Assessment Pain Scale: Faces Pain Score: 5  Faces Pain Scale: Hurts even more Pain Type: Acute pain Pain Location: Rectum Pain Orientation: Left Pain Descriptors / Indicators: Aching Pain Frequency: Intermittent Pain Onset: On-going Patients Stated Pain Goal: 2 Pain Intervention(s): Medication (See eMAR)  Therapy/Group: Individual Therapy  Tyler Richards Tyler Richards 08/08/2020, 1:03 PM

## 2020-08-08 NOTE — Progress Notes (Signed)
[facility] PHYSICAL MEDICINE & REHABILITATION PROGRESS NOTE   Subjective/Complaints:   Pt reports had a good BM last night after enema- doesn't ever want an enema again- passing solid stool now.   "100% better".    ROS:  Pt denies SOB, abd pain, CP, N/V/C/D, and vision changes   Objective:   No results found. No results for input(s): WBC, HGB, HCT, PLT in the last 72 hours. No results for input(s): NA, K, CL, CO2, GLUCOSE, BUN, CREATININE, CALCIUM in the last 72 hours.  Intake/Output Summary (Last 24 hours) at 08/08/2020 1003 Last data filed at 08/07/2020 2107 Gross per 24 hour  Intake 360 ml  Output --  Net 360 ml     Pressure Injury 08/06/20 Coccyx Mid Stage 1 -  Intact skin with non-blanchable redness of a localized area usually over a bony prominence. (Active)  08/06/20 0845  Location: Coccyx  Location Orientation: Mid  Staging: Stage 1 -  Intact skin with non-blanchable redness of a localized area usually over a bony prominence.  Wound Description (Comments):   Present on Admission:     Physical Exam: Vital Signs Blood pressure (!) 105/56, pulse (!) 55, temperature 98.5 F (36.9 C), resp. rate 16, height 6' (1.829 m), weight 76.4 kg, SpO2 98 %.   General: Alert and oriented x 3, No apparent distress- sitting up in bed; wife in room, NAD HEENT: conjugate gaze Neck: Supple without JVD or lymphadenopathy Heart: bradycardic regular rhythm Chest: CTA B/L- no W/R/R- good air movement Abdomen: more soft, less TTP- ND, hypoactive BS Extremities: No clubbing, cyanosis, or edema. 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 Functional deficits secondary to Right MCA infarct which require  3+ hours per day of interdisciplinary therapy in a comprehensive inpatient rehab setting.  Physiatrist is providing close team supervision and 24 hour management of active medical problems listed below.  Physiatrist and rehab team continue to assess barriers to discharge/monitor patient progress toward functional and medical goals  Care Tool:  Bathing    Body parts bathed by patient: Left arm, Chest, Abdomen, Right upper leg, Left upper leg, Right lower leg, Face, Front perineal area, Left lower leg, Buttocks, Right arm   Body parts bathed by helper: Right arm Body parts n/a: Buttocks   Bathing assist Assist Level: Minimal Assistance - Patient > 75%     Upper Body Dressing/Undressing Upper body dressing   What is the patient wearing?: Pull over shirt    Upper body assist Assist Level: Minimal Assistance - Patient > 75%    Lower Body Dressing/Undressing Lower body dressing      What is the patient wearing?: Pants     Lower body assist Assist for lower body dressing: Minimal Assistance - Patient > 75%     Toileting Toileting    Toileting assist Assist for toileting: Minimal Assistance - Patient > 75%     Transfers Chair/bed transfer  Transfers assist     Chair/bed transfer assist level: Minimal Assistance - Patient > 75%     Locomotion Ambulation   Ambulation assist   Ambulation activity did not occur: Safety/medical concerns (required skilled intervention to ambulate 78ft at hallway rail)  Assist level: Moderate Assistance - Patient  50 - 74% Assistive device: Hand held assist Max distance: 5   Walk 10 feet activity   Assist  Walk 10 feet activity did not occur: Safety/medical concerns  Assist level: Minimal Assistance - Patient > 75% Assistive device: Hand held assist   Walk 50 feet activity   Assist Walk 50 feet with 2 turns activity did not occur: Safety/medical concerns  Assist level: Moderate Assistance - Patient - 50 - 74% Assistive  device: Hand held assist    Walk 150 feet activity   Assist Walk 150 feet activity did not occur: Safety/medical concerns  Assist level: 2 helpers Assistive device: Other (comment) (3 Musketeer)    Walk 10 feet on uneven surface  activity   Assist Walk 10 feet on uneven surfaces activity did not occur: Safety/medical concerns         Wheelchair     Assist Will patient use wheelchair at discharge?: Yes Type of Wheelchair: Manual    Wheelchair assist level: Supervision/Verbal cueing Max wheelchair distance: 100    Wheelchair 50 feet with 2 turns activity    Assist        Assist Level: Supervision/Verbal cueing   Wheelchair 150 feet activity     Assist      Assist Level: Moderate Assistance - Patient 50 - 74%   Medical Problem List and Plan: 1.  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  Continue CIR PT, OT, SLP  2.  Antithrombotics: -DVT/anticoagulation: Venous Doppler studies negative.  SCDs.  No heparin given hemorrhage.             -antiplatelet therapy: Aspirin 81 mg daily, currently off of Brilinta given GIB and ICA has reoccluded 3. Pain Management: Tylenol as needed.   Complaints of left leg aching, worst at night, started after his stroke. Does not feel like gout. Discussed trialing Gabapentin 300mg  TID and patient and wife are agreeable.   11/8: well controlled   11/11: Worst pain this morning is near rectum- erythematous, butt cream and with hazel pads ordered.   11/12: Pain has improved with above interventions 4. Mood: Provide emotional support             -antipsychotic agents: N/A 5. Neuropsych: This patient is?  Fully capable of making decisions on his own behalf. 6. Skin/Wound Care: Routine skin checks 7. Fluids/Electrolytes/Nutrition: Routine in and outs d/c IV 8.   Constipation, resume colace, start senna HS as well. KUB shows constipation.  11/12: Able to have BM with above regimen.  11/13- doing better- had BM with enema- asking if we increase bowel meds, to go slow- very scared of incontinence- appropriate 9.  Hyperlipidemia.  Lipitor 10.  Gout.  Colchicine twice daily. Held due to diarrhea, most recently had flare up in foot and RIght elbow   Monitor for any gout flareups  Reasonably controlled , gouty tophi R>L 1st MTP-  No evidence of flare-up 11.  Orthostasis/bradycardia.  ProAmatine 5 mg every 8 hours as well as Florinef 0.1 mg daily.  Monitor with increased mobility.  Follow-up per cardiology services, Order orthostatic vitals  Vitals:   08/07/20 1923 08/08/20 0334  BP: 119/71 (!) 105/56  Pulse: (!) 46 (!) 55  Resp: 17 16  Temp: 97.9 F (36.6 C) 98.5 F (36.9 C)  SpO2: 99% 98%   Lying to sit ortho BP ok this am  Recurrent orthostasis. Continue midodrine and fludricortisone, changed midorine to 10mg    Increased florinef to 0.15BID at 0800 and 12pm.  11/11-12: much improved, advised with continue to require abdominal binder at home while weaning off florinef and midodrine  11/13- BP soft to OK in last 24 hours- pulse 40s-50s- con't Florinef/midodrine 12.  Enterobacter UTI.  resolved 13.  Rectal bleeding.  Follow-up GI services.  Currently no plan for endoscopic studies and monitor hemoglobin hematocrit.   colonoscopy  As OP  Hemoglobin stable 14.  Spasticity Left hamstring and Left pectoralis which bothers pt at noc  Baclofen changed to Klonopin 0.25mg  qhs may be prn   Botox left pectoralis has reduced tone from MAS 3 to MAS 2, Botox the hamstring has reduced tone MAS 1.  Elbow flexor tone still at MAS 2/3.  Will likely need to repeat as outpatient 16.  Post stroke dysphagia  D2 thins, advance diet as tolerated- pt prefers D1  LOS: 30 days A FACE TO FACE EVALUATION WAS PERFORMED  Tyler Richards 08/08/2020, 10:03 AM

## 2020-08-08 NOTE — Progress Notes (Addendum)
Occupational Therapy Session Note  Patient Details  Name: Tyler Richards MRN: [id number] Date of Birth: March 17, 1951  Today's Date: 08/08/2020 OT Individual Time: 1345-1430 OT Individual Time Calculation (min): 45 min    Short Term Goals: Week 5:  OT Short Term Goal 1 (Week 5): Continue working on established LTGs set at min assist overall.  Skilled Therapeutic Interventions/Progress Updates:    Pt supine in bed, c/o intermittent sharp cramping in low abdomen due to constipation.  Nursing made aware.  Pts wife present throughout session to participate in functional transfer training.  Pt completed supine to sit with supervision.  Pts wife assisted pt during squat pivot transfer EOB<>w/c x 2 trials with OT only needing to provide occasional VCs for body mechanics.  Pt needing to take intermittent supine rest breaks due to abdominal pain.  Pt then transported via w/c to bathroom and  OT educated pt and wife on w/c placement and body mechanics for squat pivot transfer using drop arm commode.  Pt and wife completed squat pivot w/c<>commode with close supervision needed from OT.  Pt then completed sit<>stand with wife assistance for clothing management, however pt needing use of grab bar on right to safely maintain standing balance.  Wife reports they do not have grab bar or counter top on right of toilet at home, therefore OT educated pt and wife on partial stance body mechanics using only armrest of commode to maintain balance.  Pt and wife return demonstrated to pull pants over hips needing supervision by OT.  Discussed completing toilet transfer to [facility] at bedside and using bedrail for balance since pt does have hospital bed at home and pt and wife receptive to this technique for training in future sessions.  Pt completed squat pivot w/c to EOB with wife assisting safely and sit to supine with supervision.  Call bell in reach, bed alarm on.    Therapy Documentation Precautions:   Precautions Precautions: Fall, Other (comment) Precaution Comments: left hemiparesis with left hemi inattention; monitor HR and BP, TEDs and abdominal binder when up Restrictions Weight Bearing Restrictions: No   Therapy/Group: Individual Therapy  Ezekiel Slocumb 08/08/2020, 4:46 PM

## 2020-08-09 ENCOUNTER — Inpatient Hospital Stay (HOSPITAL_COMMUNITY): Payer: Medicare Other

## 2020-08-09 MED ORDER — POLYETHYLENE GLYCOL 3350 17 G PO PACK
17.0000 g | PACK | Freq: Every day | ORAL | Status: DC
  Administered 2020-08-10 – 2020-08-12 (×3): 17 g via ORAL
  Filled 2020-08-09 (×3): qty 1

## 2020-08-09 MED ORDER — BELLADONNA ALKALOIDS-OPIUM 16.2-60 MG RE SUPP
1.0000 | Freq: Three times a day (TID) | RECTAL | Status: DC | PRN
  Administered 2020-08-09 – 2020-08-10 (×2): 1 via RECTAL
  Filled 2020-08-09 (×3): qty 1

## 2020-08-09 NOTE — Progress Notes (Signed)
[facility] PHYSICAL MEDICINE & REHABILITATION PROGRESS NOTE   Subjective/Complaints:   Pt reports now feels horrific pain in rectum- was told by therapy, has hard stools- hard small balls of stool.  Pt reports is passing stools, but also having regular BMs now, however rectal pain is horrific- like passing the "rock"- refuses another enema or clean out, but we discussed B&O suppository- he's willing to try.     ROS:   Pt denies SOB, abd pain, CP, N/V/C/D, and vision changes   Objective:   No results found. No results for input(s): WBC, HGB, HCT, PLT in the last 72 hours. No results for input(s): NA, K, CL, CO2, GLUCOSE, BUN, CREATININE, CALCIUM in the last 72 hours.  Intake/Output Summary (Last 24 hours) at 08/09/2020 1029 Last data filed at 08/08/2020 2100 Gross per 24 hour  Intake 420 ml  Output 125 ml  Net 295 ml     Pressure Injury 08/06/20 Coccyx Mid Stage 1 -  Intact skin with non-blanchable redness of a localized area usually over a bony prominence. (Active)  08/06/20 0845  Location: Coccyx  Location Orientation: Mid  Staging: Stage 1 -  Intact skin with non-blanchable redness of a localized area usually over a bony prominence.  Wound Description (Comments):   Present on Admission:     Physical Exam: Vital Signs Blood pressure 133/65, pulse (!) 53, temperature 97.7 F (36.5 C), resp. rate 19, height 6' (1.829 m), weight 76 kg, SpO2 98 %.   General: Alert and Ox3- wife at bedside- had episode where appeared to have rectal spasms/tear vs spasms; RN in room, in a lot of pain, obviously, but NAD HEENT: conjugate gaze Neck: Supple without JVD or lymphadenopathy Heart: bradycardic- regular rhythm Chest: CTA B/L- no W/R/R- good air movement Abdomen: Soft, NT, ND, (+)BS - hypoactive Extremities: No clubbing, cyanosis, or edema. Pulses are 2+  Skin: MASD MSK- no lesions or tenderness over left tibia, left knee , ankle and MTP without effusion  Gouty tophi  bilateral 1st MTP  Motor: RUE/RLE: 5/5 proximal distal LUE 1+/5 proximal distal except triceps now 2- LLE: 3+/5 proximal distal Increased tone noted in left upper extremity  Assessment/Plan: 1. Functional deficits secondary to Right MCA infarct which require 3+ hours per day of interdisciplinary therapy in a comprehensive inpatient rehab setting.  Physiatrist is providing close team supervision and 24 hour management of active medical problems listed below.  Physiatrist and rehab team continue to assess barriers to discharge/monitor patient progress toward functional and medical goals  Care Tool:  Bathing    Body parts bathed by patient: Left arm, Chest, Abdomen, Right upper leg, Left upper leg, Right lower leg, Face, Front perineal area, Left lower leg, Buttocks, Right arm   Body parts bathed by helper: Right arm Body parts n/a: Buttocks   Bathing assist Assist Level: Minimal Assistance - Patient > 75%     Upper Body Dressing/Undressing Upper body dressing   What is the patient wearing?: Pull over shirt    Upper body assist Assist Level: Minimal Assistance - Patient > 75%    Lower Body Dressing/Undressing Lower body dressing      What is the patient wearing?: Pants     Lower body assist Assist for lower body dressing: Minimal Assistance - Patient > 75%     Toileting Toileting    Toileting assist Assist for toileting: Minimal Assistance - Patient > 75%     Transfers Chair/bed transfer  Transfers assist     Chair/bed  transfer assist level: Minimal Assistance - Patient > 75%     Locomotion Ambulation   Ambulation assist   Ambulation activity did not occur: Safety/medical concerns (required skilled intervention to ambulate 36ft at hallway rail)  Assist level: Moderate Assistance - Patient 50 - 74% Assistive device: Hand held assist Max distance: 5   Walk 10 feet activity   Assist  Walk 10 feet activity did not occur: Safety/medical  concerns  Assist level: Minimal Assistance - Patient > 75% Assistive device: Hand held assist   Walk 50 feet activity   Assist Walk 50 feet with 2 turns activity did not occur: Safety/medical concerns  Assist level: Moderate Assistance - Patient - 50 - 74% Assistive device: Hand held assist    Walk 150 feet activity   Assist Walk 150 feet activity did not occur: Safety/medical concerns  Assist level: 2 helpers Assistive device: Other (comment) (3 Musketeer)    Walk 10 feet on uneven surface  activity   Assist Walk 10 feet on uneven surfaces activity did not occur: Safety/medical concerns         Wheelchair     Assist Will patient use wheelchair at discharge?: Yes Type of Wheelchair: Manual    Wheelchair assist level: Supervision/Verbal cueing Max wheelchair distance: 100    Wheelchair 50 feet with 2 turns activity    Assist        Assist Level: Supervision/Verbal cueing   Wheelchair 150 feet activity     Assist      Assist Level: Moderate Assistance - Patient 50 - 74%   Medical Problem List and Plan: 1.  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  Continue CIR PT, OT, SLP  2.  Antithrombotics: -DVT/anticoagulation: Venous Doppler studies negative.  SCDs.  No heparin given hemorrhage.             -antiplatelet therapy: Aspirin 81 mg daily, currently off of Brilinta given GIB and ICA has reoccluded 3. Pain Management: Tylenol as needed.   Complaints of left leg aching, worst at night, started after his stroke. Does not feel like gout. Discussed trialing Gabapentin 300mg  TID and patient and wife are agreeable.   11/8: well controlled   11/11: Worst pain this morning is near rectum- erythematous, butt cream and with hazel pads ordered.   11/12: Pain has improved with above  interventions  11.14- will try B&O suppositories for rectal pain- not sure cause 4. Mood: Provide emotional support             -antipsychotic agents: N/A 5. Neuropsych: This patient is?  Fully capable of making decisions on his own behalf. 6. Skin/Wound Care: Routine skin checks 7. Fluids/Electrolytes/Nutrition: Routine in and outs d/c IV 8.  Constipation, resume colace, start senna HS as well. KUB shows constipation.  11/12: Able to have BM with above regimen.  11/13- doing better- had BM with enema- asking if we increase bowel meds, to go slow- very scared of incontinence- appropriate  11/14- added miralax scheduled- also KUB stat to make sure no blockage.  9.  Hyperlipidemia.  Lipitor 10.  Gout.  Colchicine twice daily. Held due to diarrhea, most recently had flare up in foot and RIght elbow   Monitor for any gout flareups  Reasonably controlled , gouty tophi R>L 1st MTP-  No evidence of flare-up 11.  Orthostasis/bradycardia.  ProAmatine 5 mg every  8 hours as well as Florinef 0.1 mg daily.  Monitor with increased mobility.  Follow-up per cardiology services, Order orthostatic vitals  Vitals:   08/08/20 1936 08/09/20 0616  BP: 118/67 133/65  Pulse: 72 (!) 53  Resp: 17 19  Temp: 97.8 F (36.6 C) 97.7 F (36.5 C)  SpO2: 97% 98%   Lying to sit ortho BP ok this am   Recurrent orthostasis. Continue midodrine and fludricortisone, changed midorine to 10mg    Increased florinef to 0.15BID at 0800 and 12pm.  11/11-12: much improved, advised with continue to require abdominal binder at home while weaning off florinef and midodrine  11/14- BP soft to OK- con't regimen 12.  Enterobacter UTI.  resolved 13.  Rectal bleeding.  Follow-up GI services.  Currently no plan for endoscopic studies and monitor hemoglobin hematocrit.   colonoscopy  As OP  Hemoglobin stable 14.  Spasticity Left hamstring and Left pectoralis which bothers pt at noc  Baclofen changed to Klonopin 0.25mg  qhs may be  prn   Botox left pectoralis has reduced tone from MAS 3 to MAS 2, Botox the hamstring has reduced tone MAS 1.  Elbow flexor tone still at MAS 2/3.  Will likely need to repeat as outpatient 16.  Post stroke dysphagia  D2 thins, advance diet as tolerated- pt prefers D1  LOS: 31 days A FACE TO FACE EVALUATION WAS PERFORMED  Beonca Gibb 08/09/2020, 10:29 AM

## 2020-08-09 NOTE — Progress Notes (Signed)
Patient did not sleep all night. Called for assistance for various reasons every 20-30 minutes. All attempts to meet patient needs and make comfortable was not successful.

## 2020-08-09 NOTE — Plan of Care (Signed)
  Problem: RH BLADDER ELIMINATION Goal: RH STG MANAGE BLADDER WITH ASSISTANCE Description: STG Manage Bladder With min Assistance Outcome: Not Progressing; incontinence   Problem: RH BOWEL ELIMINATION Goal: RH STG MANAGE BOWEL WITH ASSISTANCE Description: STG Manage Bowel with min Assistance. Outcome: Progressing; Patient had BM 11/14; noted smearing and c/o rectal pain; B &O supp given per order

## 2020-08-10 ENCOUNTER — Inpatient Hospital Stay (HOSPITAL_COMMUNITY): Payer: Medicare Other | Admitting: Speech Pathology

## 2020-08-10 ENCOUNTER — Inpatient Hospital Stay (HOSPITAL_COMMUNITY): Payer: Medicare Other

## 2020-08-10 ENCOUNTER — Inpatient Hospital Stay (HOSPITAL_COMMUNITY): Payer: Medicare Other | Admitting: Occupational Therapy

## 2020-08-10 MED ORDER — DOCUSATE SODIUM 100 MG PO CAPS
200.0000 mg | ORAL_CAPSULE | Freq: Two times a day (BID) | ORAL | Status: DC
  Administered 2020-08-10 – 2020-08-12 (×5): 200 mg via ORAL
  Filled 2020-08-10 (×5): qty 2

## 2020-08-10 NOTE — Progress Notes (Signed)
Speech Language Pathology Discharge Summary  Patient Details  Name: GEDEON BRANDOW MRN: [id number] Date of Birth: 12/24/1950  Today's Date: 08/11/2020 SLP Individual Time: 0800-0857 SLP Individual Time Calculation (min): 57 min   Skilled Therapeutic Interventions:  Pt was seen for skilled ST targeting dysphagia and cognitive goals. SLP facilitate session with skilled observation of pt consuming yogurt and thin liquids. He demonstrated verbal recall and functional use of compensatory swallow strategies for oral clearance Mod I. No overt s/sx aspiration noted across textures. SLP further facilitated session with re-administration of Cognistat evaluation. He scored [facility] for all subtests with exception of short term recall (mild-mod deficit) and visual construction (mild-mod deficit). Pt required Min A cueing to implement a problem solving during this semi-complex visual construction task, and Supervision A level verbal cues for scanning to the left. During a clock drawing task, pt demonstrated continued deficits primarily in organization and planning. Discussed these deficits in context of daily functioning and continued targets for follow up therapy. All questions answered to pt and wife's satisfaction. Pt left laying in bed with alarm set and needs within reach. Continue per current plan of care.    Patient has met 8 of 8 long term goals.  Patient to discharge at overall Supervision;Min;Modified Independent level.  Reasons goals not met: n/a   Clinical Impression/Discharge Summary:   Pt made functional gains and met 8 out of 8 long term goals this admission. Although Mod I for selective attention to tasks, he currently requires Supervision-Min assist for most basic to mildly complex tasks due to cognitive impairments impacting his emergent awareness, short term memory, and problem solving skills. Mild left visual inattention and difficulty with scanning left to right also still impacts his cognitive  function and reading abilities too, although much improved since admission. As a result of current deficits, he will require 24/7 supervision at discharge, which his wife is confident she can provide. Pt is consuming a Dysphagia 1 (puree) texture diet with thin liquids; from an oropharyngeal standpoint, pt has demonstrated efficient mastication and oral clearance of Dys 2 (minced/ground) and Dys 3 (mechanical soft) textures during trials with SLP, pt prefers the texture of purees and has expressed he would like to stay on this texture diet. ST has continually checked in with pt regarding potential desire to upgrade throughout stay and he has always politely declined. He is Mod I for use of safe and compensatory swallow strategies during intake. Pt still presents with very mild dysarthria, however he is 90-95% intelligible in conversation and uses compensatory strategies Mod I. Given continued impact of current cognitive-linguistic and swallow deficits still present, recommend pt continue to receive skilled ST services upon discharge. Pt and family education is complete at this time.    Care Partner:  Caregiver Able to Provide Assistance: Yes  Type of Caregiver Assistance: Cognitive  Recommendation:  24 hour supervision/assistance;Outpatient SLP;Home Health SLP  Rationale for SLP Follow Up: Maximize cognitive function and independence;Reduce caregiver burden;Maximize swallowing safety   Equipment: none   Reasons for discharge: Discharged from hospital   Patient/Family Agrees with Progress Made and Goals Achieved: Yes    Arbutus Leas 08/11/2020, 12:15 PM

## 2020-08-10 NOTE — Progress Notes (Signed)
Occupational Therapy Session Note  Patient Details  Name: Tyler Richards MRN: [id number] Date of Birth: 1951-04-25  Today's Date: 08/10/2020 OT Individual Time: 1001-1105 OT Individual Time Calculation (min): 64 min    Short Term Goals: Week 4:  OT Short Term Goal 1 (Week 4): Pt will donn a pullover shirt with min guard assist following hemi techniques. OT Short Term Goal 1 - Progress (Week 4): Not met OT Short Term Goal 2 (Week 4): Pt will complete toilet transfer with min assist stand pivot. OT Short Term Goal 2 - Progress (Week 4): Not met OT Short Term Goal 3 (Week 4): Pt will use the LUE at a gross assist level with min facilitation for holding items to be opened with selfcare tasks. OT Short Term Goal 3 - Progress (Week 4): Not met OT Short Term Goal 4 (Week 4): Pt will complete LB dressing at min assist level sit to stand except for donning TEDs. OT Short Term Goal 4 - Progress (Week 4): Met  Skilled Therapeutic Interventions/Progress Updates:    [name] spouse in for family education this session.  Had them practice both stand and squat pivot transfers to the drop arm commode. Much more difficulty noted with attempt of stand pivot as pt still pushes slightly to the left as well as posteriorly.  Taught both pt and spouse the over the back Bobath method of transfer squat pivot with use of the gait belt and she was able to return demonstrate several times as well as standing for simulated clothing management.  She was able to also complete squat pivot transfers with pt to the tub bench for the walk-in shower.  Educated them on lateral leans side to side for washing peri area as pt will not be able to stand in the shower.  He was able to complete transfer with min assist using the Bobath method again.  Spouse reports that they already have a tub bench, but will benefit from having the drop arm commode.  Will get SW to order for them.  Next, had pt return to the room with the call button and  phone in reach and safety belt in place.    Therapy Documentation Precautions:  Precautions Precautions: Fall, Other (comment) Precaution Comments: left hemiparesis with left hemi inattention; monitor HR and BP, TEDs and abdominal binder when up Restrictions Weight Bearing Restrictions: No  Pain: Pain Assessment Pain Scale: Faces Pain Score: 0-No pain ADL: See Care Tool Section for some details of mobility and selfcare  Therapy/Group: Individual Therapy  Ramy Greth OTR/L 08/10/2020, 12:05 PM

## 2020-08-10 NOTE — Discharge Summary (Signed)
Physician Discharge Summary  Patient ID: Tyler Richards MRN: [id number] DOB/AGE: 04-11-1951 69 y.o.  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 No bowel dilatation or air-fluid level to suggest bowel obstruction. No free air. No abnormal calcifications. Visualized lung bases clear. IMPRESSION: Moderate stool in colon.  No bowel obstruction or free air. Electronically Signed   By: Lowella Grip III M.D.   On: 08/09/2020 11:11   DG Abd 1 View  Result Date: 08/04/2020 CLINICAL DATA:  Constipation EXAM: ABDOMEN - 1 VIEW COMPARISON:  None. FINDINGS: Two supine frontal views of the abdomen and pelvis demonstrate marked retained stool throughout the colon compatible with given history of constipation. No bowel obstruction or ileus. No masses or abnormal calcifications. Vertical lucency overlying the right hemiabdomen likely reflects a skin fold. IMPRESSION: 1. Moderate fecal retention consistent with constipation. No evidence of obstruction. Electronically Signed   By: Randa Ngo M.D.   On: 08/04/2020 19:32    Labs:  Basic Metabolic Panel: No results for input(s): NA, K, CL, CO2, GLUCOSE, BUN, CREATININE, CALCIUM, MG, PHOS in the last 168 hours.  CBC: No results for input(s): WBC, NEUTROABS, HGB, HCT, MCV, PLT in the last 168 hours.  CBG: No results for input(s): GLUCAP in the last 168 hours.  Family history.  Positive for hypertension hyperlipidemia.  Denies any  colon cancer esophageal cancer rectal cancer  Brief HPI:   Tyler Richards is a 69 y.o. right-handed male with unremarkable past medical history no prescription medications.  Per chart review lives with spouse independent prior to admission and active.  1 level home 2 steps to entry.  Presented 06/29/2020 with acute onset of left-sided weakness and slurred speech.  Cranial CT scan showed hyperdense distal right ICA and proximal MCA.  Blunted appearance of the posterior right putamen.  Chronic right high frontal cortex infarct.  Patient did not receive TPA.  CT angiogram of head and neck emergent large vessel occlusion with no flow seen in the right internal carotid artery or proximal MCA.  Patient underwent right MCA thrombectomy right ICA stent placement 06/29/2020 per interventional radiology.  Most recent MRI and imaging revealed acute infarct right basal ganglia with nonprogressive hemorrhage when correlated with a prior CT of 06/29/2020.  Lower extremity Dopplers no DVT.  Carotid Dopplers no ICA stenosis.  Echocardiogram with ejection fraction of 50 to 89% grade 1 diastolic dysfunction.  Admission chemistries unremarkable aside from glucose 104 urine drug screen negative.  Patient did receive cardiology consult for bradycardia consistent with hyper vagotonia and maintained on ProAmatine.  EKG normal sinus rhythm 70s to 80s occasional sinus bradycardia.  Patient initially maintained on aspirin as well as Brilinta.  Extubated 06/30/2020.  Urine culture greater 100,000 Enterobacter placed on Maxipime changed to Bactrim.  Gastroenterology services consulted due to some bright red blood with bowel movement currently holding off on any endoscopic evaluation monitoring hemoglobin hematocrit with latest hemoglobin 9.8.  His Brilinta had been placed on hold after rectal outlet bleeding continued on low-dose aspirin at the recommendations of neurology services.  Dysphagia #1 thin liquid diet.  Patient  was admitted for a  comprehensive rehab program   Hospital Course: Tyler Richards was admitted to rehab 07/09/2020 for inpatient therapies to consist of PT, ST and OT at least three hours five days a week. Past admission physiatrist, therapy team and rehab RN have worked together to provide customized collaborative inpatient rehab.  Pertaining to patient right MCA infarction due to right ICA and MCA occlusion status post revascularization stenting subsequent right basal ganglia hemorrhage.  Patient followed by neurology services.  Currently maintained on aspirin only Brilinta on hold given GI bleed follow-up per gastroenterology services hemoglobin hematocrit remained stable.  Pain management with the use of Neurontin 300 mg 3 times daily.  Bouts of constipation with hemorrhoids bowel program established.  He continued on Lipitor for hyperlipidemia.  He did have a history of gout initially on colchicine held due to bouts of diarrhea.  Blood pressure monitored closely bouts of orthostasis maintained on ProAmatine as well as Florinef with follow-up cardiology services.   Blood pressures were monitored on TID basis and soft and monitored      Rehab course: During patient's stay in rehab weekly team conferences were held to monitor patient's progress, set goals and discuss barriers to discharge. At admission, patient required total assist stand pivot transfers max assist sit to supine.  Minimal assist grooming moderate assist upper body bathing max is lower body bathing mod assist upper body dressing total assist lower body dressing  Physical exam.  Blood pressure 101/57 pulse 48 temperature 98.3 respirations 20 oxygen saturation 98% room air Constitutional.  No acute distress HEENT Head.  Normocephalic and atraumatic Neck.  Supple nontender no JVD without thyromegaly Cardiac regular rate rhythm without extra sounds or murmur heard Abdomen.  Soft nontender positive bowel sounds without rebound Respiratory effort normal  no respiratory distress without wheeze Extremities.  No clubbing cyanosis or edema Neurologic.  Patient alert somewhat anxious.  He was able to state his birthday and reason for being in the hospital.  Demonstrates some left-sided inattention. Right upper extremity 4/5 Right lower extremity 4/5 Left upper extremity 2/5 SA, EE, EF, 0/5W EE, handgrip Left lower extremity 0/5 hip flexors, knee extension, 1/5 dorsi plantarflexion   He/  has had improvement in activity tolerance, balance, postural control as well as ability to compensate for deficits. He/ has had improvement in functional use RUE/LUE  and RLE/LLE as well as improvement in awareness.  Working with energy conservation techniques.  Family teaching ongoing.  Supine on bed donned bilateral lower extremity thigh-high TED hose max assist.  Supine sitting right edge of bed, head of bed partially elevated relying heavily on bed rail supervision.  Transition to squat pivot technique transfers demonstrating good safety awareness.  Patient's wife provided all hands-on assistance unless otherwise stated during family teaching.  While sitting edge of bed initiated donning shoes with some assistance.  Wife provided total assist lower body management.  During ADLs patient was able to complete supine to sit with supervision.  Wife assisted patient during squat pivot transfers again as noted.  Patient wife completed squat to the wheelchair commode with close supervision needed from occupational therapy.  Patient 90% intelligible during sessions to provide information and conversation.  He remained on a dysphagia #1 thin liquid diet with teaching provided to family.  Full family teaching completed discharge to home       Disposition: Discharged to home    Diet: Dysphagia #1 thin liquids  Special Instructions: No driving smoking or alcohol  Medications  at discharge 1.  Tylenol as needed 2.  Aspirin 81 mg p.o. daily 3.  Lipitor 40 mg p.o.  daily 4.Nupercainal 1% topical hemorrhoids daily as needed 5.  Voltaren gel 2 g 3 times daily to affected area 6.  Colace 100 mg p.o. twice daily hold for loose stools 7.  Florinef 0.15 mg p.o. twice daily 8.  Neurontin 300 mg p.o. 3 times daily 9.  ProAmatine 10 mg p.o. twice daily with meals 10.  Protonix 40 mg p.o. daily 11.  Protonix 40 mg p.o. daily 12.  MiraLAX daily hold for loose stools 13.  Florastor 250 mg p.o. twice daily 14.  Senokot 2 tablet p.o. nightly hold for loose stools   30-35 minutes were spent completing discharge summary and discharge planning  Discharge Instructions    Ambulatory referral to Neurology   Complete by: As directed    An appointment is requested in approximately 4 weeks right MCA infarction due to ICA occlusion   Ambulatory referral to Physical Medicine Rehab   Complete by: As directed    Moderate complexity follow-up 1 to 2 weeks right MCA infarction       Follow-up Information    Kirsteins, Luanna Salk, MD Follow up.   Specialty: Physical Medicine and Rehabilitation Why: Office to call for appointment Contact information: [place] [address] [phone]               Signed: Cathlyn Parsons 08/12/2020, 5:16 AM

## 2020-08-10 NOTE — Progress Notes (Signed)
Physical Therapy Session Note  Patient Details  Name: Tyler Richards MRN: [id number] Date of Birth: 02-01-1951  Today's Date: 08/10/2020 PT Individual Time: 0800-0912 PT Individual Time Calculation (min): 72 min   Short Term Goals: Week 5:  PT Short Term Goal 1 (Week 5): STG= LTG due to ELOS  Skilled Therapeutic Interventions/Progress Updates:    Patient received supine in bed, wife at bedside, agreeable to PT. He reports "significant pain" in his rectum, but was unable to provide numerical rating. RN alerted. PT provided rest breaks, repositioning and distractions to assist with pain management. PT donning TED hose with patient supine. ModA to don pants with verbal cues for problem solving correct alignment of pants. Patient making multiple attempts to put foot through distal hole of pants leg as opposed to through the waist band, despite verbal cuing. Supervision to come to sitting edge of bed. MinA + max verbal cues needed to don shirt while seated edge of bed. Abd binder donned. MinA squat pivot to wc. Patient transferred into sedan-height car with [place] and verbal cues. Wife able to complete this transfer safely as well. PT educating patient and wife on proper form for both patient and wife safety, use of handle/car frame to assist with sitting balance while moving B LE into car. Wife completed this transfer x2 with close supervision from PT. Patient with tendency to open up into trunk extension during transfer making it less safe and successful. PT educating wife and patient on importance of maintaining trunk flexion throughout transfer to ensure safe and success. Patient and wife able to verbalize and demonstrate understanding. Patient ambulating x20 feet with initially [place] progressing to MinA with faster, reciprocal stepping gait speed. Wife able to demo gait with patient x5 feet with close supervision from PT. Patient propelling himself in wc using B LE and verbal cues to scan environment for  obstacles x144ft. He returned to room in wc, seatbelt alarm on, call light within reach.   Therapy Documentation Precautions:  Precautions Precautions: Fall, Other (comment) Precaution Comments: left hemiparesis with left hemi inattention; monitor HR and BP, TEDs and abdominal binder when up Restrictions Weight Bearing Restrictions: No    Therapy/Group: Individual Therapy  Karoline Caldwell, PT, DPT, CBIS  08/10/2020, 7:33 AM

## 2020-08-10 NOTE — Progress Notes (Addendum)
[facility] PHYSICAL MEDICINE & REHABILITATION PROGRESS NOTE   Subjective/Complaints:   Pt responded to dulcolax and disimpaction, Xray reports moderate stool,    ROS:   Pt denies SOB, abd pain, CP, N/V/C/D, and vision changes   Objective:   DG Abd 1 View  Result Date: 08/09/2020 CLINICAL DATA:  Abdominal pain EXAM: ABDOMEN - 1 VIEW COMPARISON:  August 04, 2020 FINDINGS: Moderate stool in colon. No bowel dilatation or air-fluid level to suggest bowel obstruction. No free air. No abnormal calcifications. Visualized lung bases clear. IMPRESSION: Moderate stool in colon.  No bowel obstruction or free air. Electronically Signed   By: Lowella Grip III M.D.   On: 08/09/2020 11:11   No results for input(s): WBC, HGB, HCT, PLT in the last 72 hours. No results for input(s): NA, K, CL, CO2, GLUCOSE, BUN, CREATININE, CALCIUM in the last 72 hours.  Intake/Output Summary (Last 24 hours) at 08/10/2020 0734 Last data filed at 08/09/2020 1956 Gross per 24 hour  Intake 420 ml  Output 200 ml  Net 220 ml     Pressure Injury 08/06/20 Coccyx Mid Stage 1 -  Intact skin with non-blanchable redness of a localized area usually over a bony prominence. (Active)  08/06/20 0845  Location: Coccyx  Location Orientation: Mid  Staging: Stage 1 -  Intact skin with non-blanchable redness of a localized area usually over a bony prominence.  Wound Description (Comments):   Present on Admission:     Physical Exam: Vital Signs Blood pressure (!) 106/56, pulse (!) 57, temperature 97.9 F (36.6 C), resp. rate 19, height 6' (1.829 m), weight 76.2 kg, SpO2 98 %.    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 Functional deficits secondary to Right MCA infarct which require 3+ hours per day of interdisciplinary therapy in a comprehensive inpatient rehab setting.  Physiatrist is providing close team supervision and 24 hour management of active medical problems listed below.  Physiatrist and rehab team continue to assess barriers to discharge/monitor patient progress toward functional and medical goals  Care Tool:  Bathing    Body parts bathed by patient: Left arm, Chest, Abdomen, Right upper leg, Left upper leg, Right lower leg, Face, Front perineal area, Left lower leg, Buttocks, Right arm   Body parts bathed by helper: Right arm Body parts n/a: Buttocks   Bathing assist Assist Level: Minimal Assistance - Patient > 75%     Upper Body Dressing/Undressing Upper body dressing   What is the patient wearing?: Pull over shirt    Upper body assist Assist Level: Minimal Assistance - Patient > 75%    Lower Body Dressing/Undressing Lower body dressing      What is the patient wearing?: Pants     Lower body assist Assist for lower body dressing: Minimal Assistance - Patient > 75%     Toileting Toileting    Toileting assist  Assist for toileting: Minimal Assistance - Patient > 75%     Transfers Chair/bed transfer  Transfers assist     Chair/bed transfer assist level: Minimal Assistance - Patient > 75%     Locomotion Ambulation   Ambulation assist   Ambulation activity did not occur: Safety/medical concerns (required skilled intervention to ambulate 27ft at hallway rail)  Assist level: Moderate  Assistance - Patient 50 - 74% Assistive device: Hand held assist Max distance: 5   Walk 10 feet activity   Assist  Walk 10 feet activity did not occur: Safety/medical concerns  Assist level: Minimal Assistance - Patient > 75% Assistive device: Hand held assist   Walk 50 feet activity   Assist Walk 50 feet with 2 turns activity did not occur: Safety/medical concerns  Assist level: Moderate Assistance - Patient - 50 - 74% Assistive device: Hand held assist    Walk 150 feet activity   Assist Walk 150 feet activity did not occur: Safety/medical concerns  Assist level: 2 helpers Assistive device: Other (comment) (3 [place])    Walk 10 feet on uneven surface  activity   Assist Walk 10 feet on uneven surfaces activity did not occur: Safety/medical concerns         Wheelchair     Assist Will patient use wheelchair at discharge?: Yes Type of Wheelchair: Manual    Wheelchair assist level: Supervision/Verbal cueing Max wheelchair distance: 100    Wheelchair 50 feet with 2 turns activity    Assist        Assist Level: Supervision/Verbal cueing   Wheelchair 150 feet activity     Assist      Assist Level: Moderate Assistance - Patient 50 - 74%   Medical Problem List and Plan: 1.  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  Continue CIR PT, OT, SLP  2.  Antithrombotics: -DVT/anticoagulation: Venous Doppler studies negative.  SCDs.  No heparin given hemorrhage.             -antiplatelet therapy: Aspirin 81 mg daily, currently off of Brilinta given GIB and ICA has reoccluded 3. Pain Management: Tylenol as needed.   Complaints of left leg aching, worst at night, started after his stroke. Does not feel like gout. Discussed trialing Gabapentin 300mg  TID and patient and wife are agreeable.   11/8:  well controlled   11/11: Worst pain this morning is near rectum- erythematous, butt cream and with hazel pads ordered.   11/12: Pain has improved with above interventions  11.14- will try B&O suppositories for rectal pain- not sure cause 4. Mood: Provide emotional support             -antipsychotic agents: N/A 5. Neuropsych: This patient is?  Fully capable of making decisions on his own behalf. 6. Skin/Wound Care: Routine skin checks 7. Fluids/Electrolytes/Nutrition: Routine in and outs d/c IV 8.  Constipation, resume colace, start senna HS as well. KUB shows constipation.  11/12: Able to have BM with above regimen.  11/13- doing better- had BM with enema- asking if we increase bowel meds, to go slow- very scared of incontinence- appropriate  11/14- added miralax scheduled- KUB moderate stool, no ileus, increased colace dose 9.  Hyperlipidemia.  Lipitor 10.  Gout.  Colchicine twice daily. Held due to diarrhea, most recently had flare up in foot and RIght elbow   Monitor for any gout  flareups  Reasonably controlled , gouty tophi R>L 1st MTP-  No evidence of flare-up 11.  Orthostasis/bradycardia.  ProAmatine 5 mg every 8 hours as well as Florinef 0.1 mg daily.  Monitor with increased mobility.  Follow-up per cardiology services, Order orthostatic vitals  Vitals:   08/09/20 1909 08/10/20 0326  BP: 140/69 (!) 106/56  Pulse: (!) 55 (!) 57  Resp: 16 19  Temp: 98.2 F (36.8 C) 97.9 F (36.6 C)  SpO2: 98% 98%  BP doing better 12.  Enterobacter UTI.  resolved 13.  Rectal bleeding.  Follow-up GI services.  Currently no plan for endoscopic studies and monitor hemoglobin hematocrit.   colonoscopy  As OP  Hemoglobin stable 14.  Spasticity Left hamstring and Left pectoralis which bothers pt at noc  Baclofen changed to Klonopin 0.25mg  qhs may be prn   Botox left pectoralis has reduced tone from MAS 3 to MAS 2, Botox the hamstring has reduced tone MAS 1.  Elbow flexor tone still at MAS 2/3.  Will  likely need to repeat as outpatient 16.  Post stroke dysphagia  D2 thins, advance diet as tolerated- pt prefers D1  LOS: 32 days A FACE TO FACE EVALUATION WAS PERFORMED  Charlett Blake 08/10/2020, 7:34 AM

## 2020-08-10 NOTE — Progress Notes (Signed)
Patient awake most of the night, restless and anxious at times. Intermittent confusion and forgetfulness noted. Requesting assistance, however when staff enters room patient states he called by accident. Pt assisted out of bed and brought to nurses station briefly and then assisted back to bed due to c/o rectal pain and constipation. Pt given tylenol, belladona and dulcolax suppositories. Upon reassessment moderate amount of hard formed stool removed from rectal vault. Dan-PA on unit and made aware, as pt declined enema.

## 2020-08-10 NOTE — Progress Notes (Signed)
Patient ID: Tyler Richards, male   DOB: 03/02/1951, 69 y.o.   MRN: [id number]   Drop arm commode ordered through [facility].   [place], [place]

## 2020-08-10 NOTE — Progress Notes (Signed)
Speech Language Pathology Daily Session Note  Patient Details  Name: Tyler Richards MRN: [id number] Date of Birth: [birth year]  Today's Date: 08/10/2020 SLP Individual Time: 2725-3664 SLP Individual Time Calculation (min): 58 min  Short Term Goals: Week 5: SLP Short Term Goal 1 (Week 5): STG=LTG due to remaining length of stay  Skilled Therapeutic Interventions: Pt was seen for skilled ST targeting completion of family and pt education, as well as targeting pt's cognitive goals. SLP facilitated education portion of session with discussion of compensatory speech intelligibility strategies, supported by handout. Pt is 95-100% intelligible during sessions now. SLP also provided verbal review and handout of compensatory memory strategies, with emphasis on routines, reducing multitasking/distractions, external aids, and associations. Also reviewed strategies to increase error awareness and accuracy during tasks, and maximize attention. SLP reinforced recommendation for 24/7 supervision and explicit recommendation for assistance with medication management, cooking, and other mildly complex to complex cognitively demanding tasks - pt and wife in agreement. SLP further facilitated session with a familiar semi-complex card task, during which pt was Supervision A for problem solving skills, but Min A verbal cues (in frequency) for awareness of errors. Pt used external aid for recall within task with Supervision as well. Pt left laying in bed with alarm set and needs within reach. Continue per current plan of care.          Pain Pain Assessment Pain Scale: Faces Faces Pain Scale: No hurt  Therapy/Group: Individual Therapy  Arbutus Leas 08/10/2020, 3:02 PM

## 2020-08-11 ENCOUNTER — Other Ambulatory Visit (HOSPITAL_COMMUNITY): Payer: Self-pay | Admitting: Physician Assistant

## 2020-08-11 ENCOUNTER — Inpatient Hospital Stay (HOSPITAL_COMMUNITY): Payer: Medicare Other | Admitting: Physical Therapy

## 2020-08-11 ENCOUNTER — Inpatient Hospital Stay (HOSPITAL_COMMUNITY): Payer: Medicare Other | Admitting: Speech Pathology

## 2020-08-11 ENCOUNTER — Inpatient Hospital Stay (HOSPITAL_COMMUNITY): Payer: Medicare Other

## 2020-08-11 ENCOUNTER — Inpatient Hospital Stay (HOSPITAL_COMMUNITY): Payer: Medicare Other | Admitting: Occupational Therapy

## 2020-08-11 MED ORDER — PANTOPRAZOLE SODIUM 40 MG PO TBEC: 40.0000 mg | DELAYED_RELEASE_TABLET | Freq: Every day | ORAL | 0 refills | Status: DC

## 2020-08-11 MED ORDER — DOCUSATE SODIUM 100 MG PO CAPS: 200.0000 mg | ORAL_CAPSULE | Freq: Two times a day (BID) | ORAL | 0 refills | Status: DC

## 2020-08-11 MED ORDER — SENNA 8.6 MG PO TABS: 2.0000 | ORAL_TABLET | Freq: Every day | ORAL | 0 refills | Status: DC

## 2020-08-11 MED ORDER — MIDODRINE HCL 10 MG PO TABS: 10.0000 mg | ORAL_TABLET | Freq: Two times a day (BID) | ORAL | 0 refills | Status: DC

## 2020-08-11 MED ORDER — GABAPENTIN 300 MG PO CAPS: 300.0000 mg | ORAL_CAPSULE | Freq: Three times a day (TID) | ORAL | 0 refills | Status: DC

## 2020-08-11 MED ORDER — FLUDROCORTISONE ACETATE 0.1 MG PO TABS: 0.1500 mg | ORAL_TABLET | Freq: Two times a day (BID) | ORAL | 0 refills | Status: DC

## 2020-08-11 MED ORDER — WITCH HAZEL-GLYCERIN EX PADS: MEDICATED_PAD | CUTANEOUS | 12 refills | Status: DC | PRN

## 2020-08-11 MED ORDER — ATORVASTATIN CALCIUM 40 MG PO TABS: 40.0000 mg | ORAL_TABLET | Freq: Every day | ORAL | 0 refills | Status: DC

## 2020-08-11 MED ORDER — DICLOFENAC SODIUM 1 % EX GEL: 2.0000 g | Freq: Three times a day (TID) | CUTANEOUS | 0 refills | Status: DC

## 2020-08-11 MED ORDER — SACCHAROMYCES BOULARDII 250 MG PO CAPS: 250.0000 mg | ORAL_CAPSULE | Freq: Two times a day (BID) | ORAL | 0 refills | Status: DC

## 2020-08-11 MED ORDER — DIBUCAINE 1 % EX OINT: TOPICAL_OINTMENT | CUTANEOUS | 0 refills | Status: DC | PRN

## 2020-08-11 MED ORDER — POLYETHYLENE GLYCOL 3350 17 G PO PACK: 17.0000 g | PACK | Freq: Every day | ORAL | 0 refills | Status: DC

## 2020-08-11 MED FILL — FLORASTOR 250 MG CAPSULE: 250 | 10 days supply | Qty: 20 | Fill #0

## 2020-08-11 MED FILL — FLUDROCORTISONE 0.1 MG TAB: 0.1 | 20 days supply | Qty: 60 | Fill #0

## 2020-08-11 MED FILL — PANTOPRAZOLE SOD DR 40 MG T: 40 | 30 days supply | Qty: 30 | Fill #0

## 2020-08-11 MED FILL — MIDODRINE HCL 10 MG TABLET: 10 | 30 days supply | Qty: 60 | Fill #0

## 2020-08-11 MED FILL — GABAPENTIN 300 MG CAPSULE: 300 | 30 days supply | Qty: 90 | Fill #0

## 2020-08-11 MED FILL — A.E.R. WITCH HAZEL PADS: 4 days supply | Qty: 40 | Fill #0

## 2020-08-11 MED FILL — ATORVASTATIN CALCIUM 40 MG: 40 | 30 days supply | Qty: 30 | Fill #0

## 2020-08-11 MED FILL — DICLOFENAC SODIUM 1 % GEL: 1 | 14 days supply | Qty: 200 | Fill #0

## 2020-08-11 NOTE — Progress Notes (Signed)
[facility] PHYSICAL MEDICINE & REHABILITATION PROGRESS NOTE   Subjective/Complaints: No issues overnight , had BM this am , no leg pain , tone increased left elbow flexor  ROS:   Pt denies SOB, abd pain, CP, N/V/C/D   Objective:   DG Abd 1 View  Result Date: 08/09/2020 CLINICAL DATA:  Abdominal pain EXAM: ABDOMEN - 1 VIEW COMPARISON:  August 04, 2020 FINDINGS: Moderate stool in colon. No bowel dilatation or air-fluid level to suggest bowel obstruction. No free air. No abnormal calcifications. Visualized lung bases clear. IMPRESSION: Moderate stool in colon.  No bowel obstruction or free air. Electronically Signed   By: Lowella Grip III M.D.   On: 08/09/2020 11:11   No results for input(s): WBC, HGB, HCT, PLT in the last 72 hours. No results for input(s): NA, K, CL, CO2, GLUCOSE, BUN, CREATININE, CALCIUM in the last 72 hours.  Intake/Output Summary (Last 24 hours) at 08/11/2020 0735 Last data filed at 08/11/2020 0230 Gross per 24 hour  Intake 960 ml  Output 325 ml  Net 635 ml     Pressure Injury 08/06/20 Coccyx Mid Stage 1 -  Intact skin with non-blanchable redness of a localized area usually over a bony prominence. (Active)  08/06/20 0845  Location: Coccyx  Location Orientation: Mid  Staging: Stage 1 -  Intact skin with non-blanchable redness of a localized area usually over a bony prominence.  Wound Description (Comments):   Present on Admission:     Physical Exam: Vital Signs Blood pressure 112/68, pulse 62, temperature 98.1 F (36.7 C), temperature source Oral, resp. rate 16, height 6' (1.829 m), weight 77.3 kg, SpO2 99 %.     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 Functional deficits secondary to Right MCA infarct which require 3+ hours per day of interdisciplinary therapy in a comprehensive inpatient rehab setting.  Physiatrist is providing close team supervision and 24 hour management of active medical problems listed below.  Physiatrist and rehab team continue to assess barriers to discharge/monitor patient progress toward functional and medical goals  Care Tool:  Bathing    Body parts bathed by patient: Left arm, Chest, Abdomen, Right upper leg, Left upper leg, Right lower leg, Face, Front perineal area, Left lower leg, Buttocks, Right arm   Body parts bathed by helper: Right arm Body parts n/a: Buttocks   Bathing assist Assist Level: Minimal Assistance - Patient > 75%     Upper Body Dressing/Undressing Upper body dressing   What is the patient wearing?: Pull over shirt    Upper body assist Assist Level: Minimal Assistance - Patient > 75%    Lower Body Dressing/Undressing Lower body dressing      What is the patient wearing?: Pants     Lower body assist Assist for lower body dressing: Minimal Assistance - Patient > 75%     Toileting Toileting    Toileting assist Assist for toileting: Minimal Assistance -  Patient > 75%     Transfers Chair/bed transfer  Transfers assist     Chair/bed transfer assist level: Minimal Assistance - Patient > 75%     Locomotion Ambulation   Ambulation assist   Ambulation activity did not occur: Safety/medical concerns (required skilled intervention to ambulate 45ft at hallway rail)  Assist level: Moderate Assistance - Patient 50 - 74% Assistive device: Hand  held assist Max distance: 25   Walk 10 feet activity   Assist  Walk 10 feet activity did not occur: Safety/medical concerns  Assist level: Minimal Assistance - Patient > 75% Assistive device: Hand held assist   Walk 50 feet activity   Assist Walk 50 feet with 2 turns activity did not occur: Safety/medical concerns  Assist level: Moderate Assistance - Patient - 50 - 74% Assistive device: Hand held assist    Walk 150 feet activity   Assist Walk 150 feet activity did not occur: Safety/medical concerns  Assist level: 2 helpers Assistive device: Other (comment) (3 Musketeer)    Walk 10 feet on uneven surface  activity   Assist Walk 10 feet on uneven surfaces activity did not occur: Safety/medical concerns         Wheelchair     Assist Will patient use wheelchair at discharge?: Yes Type of Wheelchair: Manual    Wheelchair assist level: Supervision/Verbal cueing Max wheelchair distance: 100    Wheelchair 50 feet with 2 turns activity    Assist        Assist Level: Supervision/Verbal cueing   Wheelchair 150 feet activity     Assist      Assist Level: Moderate Assistance - Patient 50 - 74%   Medical Problem List and Plan: 1.  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  Continue CIR PT, OT, SLP Plan D/C in am   2.  Antithrombotics: -DVT/anticoagulation: Venous Doppler studies negative.  SCDs.  No heparin given hemorrhage.             -antiplatelet therapy: Aspirin 81 mg daily, currently off of Brilinta given GIB and ICA has reoccluded 3. Pain Management: Tylenol as needed.   Complaints of left leg aching, worst at night, started after his stroke. Improved with gabapentin   4. Mood: Provide emotional support             -antipsychotic agents: N/A 5. Neuropsych: This patient is?  Fully  capable of making decisions on his own behalf. 6. Skin/Wound Care: Routine skin checks 7. Fluids/Electrolytes/Nutrition: Routine in and outs d/c IV 8.  Constipation, resume colace, start senna HS as well. KUB shows constipation.    11/14- added miralax scheduled- KUB moderate stool, no ileus, increased colace dose 11/16 discussed that if stool gets too loose at home, colace can be reduced to 1 tab 9.  Hyperlipidemia.  Lipitor 10.  Gout.  Colchicine twice daily. Held due to diarrhea, most recently had flare up in foot and RIght elbow   Monitor for any gout flareups  Reasonably controlled , gouty tophi R>L 1st MTP-  No evidence of flare-up 11.  Orthostasis/bradycardia.  ProAmatine 5 mg every 8 hours as well as Florinef 0.1 mg daily.  Monitor with increased mobility.  Follow-up per cardiology services, Order orthostatic vitals  Vitals:   08/10/20 1928 08/11/20 0355  BP: 108/89 112/68  Pulse: 61 62  Resp: 16 16  Temp: 97.9  F (36.6 C) 98.1 F (36.7 C)  SpO2: 98% 99%  11/16 controlled 12.  Enterobacter UTI.  resolved 13.  Rectal bleeding.  Follow-up GI services.  Currently no plan for endoscopic studies and monitor hemoglobin hematocrit.   colonoscopy  As OP  Hemoglobin stable 14.  Spasticity Left hamstring and Left pectoralis which bothers pt at noc  Baclofen changed to Klonopin 0.25mg  qhs may be prn   Botox left pectoralis has reduced tone from MAS 3 to MAS 2, Botox the hamstring has reduced tone MAS 1.  Elbow flexor tone still at MAS 2/3.  Will likely need to repeat as outpatient 16.  Post stroke dysphagia  D2 thins, advance diet as tolerated- pt prefers D1  LOS: 33 days A FACE TO FACE EVALUATION WAS PERFORMED  Charlett Blake 08/11/2020, 7:35 AM

## 2020-08-11 NOTE — Progress Notes (Signed)
Physical Therapy Session Note  Patient Details  Name: Tyler Richards MRN: [id number] Date of Birth: 1951-08-04  Today's Date: 08/11/2020 PT Missed Time: 66 Minutes Missed Time Reason: Pain  Short Term Goals: Week 5:  PT Short Term Goal 1 (Week 5): STG= LTG due to ELOS  Skilled Therapeutic Interventions/Progress Updates:    Pt received supine in bed stating "I'm useless today" reporting he is having severe "gas pain." [name] wife not present. Pt politely declines therapy session due to the pain and requests for assistance to doff his LB clothing - therapist provided max assist and during task pt states "I feel so bad I don't even feel like lifting my legs." Therapist provided emotional support and assisted pt with repositioning in the bed for pain management. Pearline Cables, LPN aware. Pt left supine in bed with needs in reach and bed alarm on. Missed 60 minutes of skilled physical therapy.  Therapy Documentation Precautions:  Precautions Precautions: Fall, Other (comment) Precaution Comments: left hemiparesis with left hemi inattention; monitor HR and BP, TEDs and abdominal binder when up Restrictions Weight Bearing Restrictions: No   Therapy/Group: Individual Therapy  Tawana Scale , PT, DPT, CSRS  08/11/2020, 3:23 PM

## 2020-08-11 NOTE — Progress Notes (Signed)
Occupational Therapy Discharge Summary  Patient Details  Name: Tyler Richards MRN: [id number] Date of Birth: 1951-07-23  Today's Date: 08/11/2020 OT Individual Time: 1107-1207 OT Individual Time Calculation (min): 60 min   Session Note:  Pt worked on bathing and dressing during session with spouse present for education.  Pt with some increased pain in his buttocks ongoing and sharp at times, but no distinct pattern.  He completed functional mobility to the shower with mod hand held assist to start.  He needed supervision and min instructional cueing for removal of his pullover shirt.  Mod assist was needed for removal of all LB clothing.  He completed bathing in sitting mostly with mod instructional cueing to sequence and for use of the washcloth.  LUE was integrated with max assist hand over hand.  [name] spouse transferred him to the wheelchair squat pivot once shower was complete at min assist level.  He then worked on dressing sit to stand from the wheelchair.  He was able to complete UB dressing with supervision once shirt was oriented and given to him.  He then completed LB dressing with min assist for donning brief and pants and mod assist for donning his shoes.  Therapist assisted with tying them and for donning his TEDs.  He completed sit to stand with spouse assist and min facilitation to pull garments up over his hips.  He was left in the wheelchair at the end of the session with the call button and phone in reach as well as the safety belt being in place.  Elbow protector was issued to provide comfort in the left elbow secondary to hx of skin tears.    Patient has met 12 of 14 long term goals due to improved activity tolerance, improved balance, postural control, ability to compensate for deficits, functional use of  LEFT upper and LEFT lower extremity, improved attention and improved awareness.  Patient to discharge at [facility] Assist level.  Patient's care partner is independent to provide  the necessary physical and cognitive assistance at discharge.    Reasons goals not met: Pt continues to need min to mod assist for memory with carryover of techniques day to day.  He also continues to need max hand over hand assist for integration of the LUE into functional tasks.    Recommendation:  Patient will benefit from ongoing skilled OT services in home health setting to continue to advance functional skills in the area of BADL and Reduce care partner burden.  Pt continues with LUE hemiparesis with the LUE being more impaired than the LLE. He also continues with some left inattention, motor planning deficits, as well as decreased carryover and memory from session to session. Feel he will benefit from extensive HHOT to progress to supervision level for selfcare tasks and basic ADLs as well as for increasing LUE function.    Equipment: wheelchair, drop arm commode  Reasons for discharge: treatment goals met and discharge from hospital  Patient/family agrees with progress made and goals achieved: Yes  OT Discharge Precautions/Restrictions  Precautions Precautions: Fall;Other (comment) Precaution Comments: left hemiparesis with left hemi inattention; monitor HR and BP, TEDs and abdominal binder when up Restrictions Weight Bearing Restrictions: No  Pain  See pain flow sheet for details ADL ADL Eating: Supervision/safety Where Assessed-Eating: Wheelchair Grooming: Supervision/safety Where Assessed-Grooming: Wheelchair Upper Body Bathing: Minimal assistance Where Assessed-Upper Body Bathing: Wheelchair, Retail buyer Bathing: Minimal assistance Where Assessed-Lower Body Bathing: Shower Upper Body Dressing: Supervision/safety Where Assessed-Upper Body Dressing:  Wheelchair Lower Body Dressing: Moderate assistance Where Assessed-Lower Body Dressing: Wheelchair Toileting: Minimal assistance Where Assessed-Toileting: Bedside Commode Toilet Transfer: Minimal assistance Toilet  Transfer Method: Ambulance person: Gaffer: Not assessed Film/video editor: Minimal assistance Film/video editor Method: Administrator: Sales promotion account executive Baseline Vision/History: Wears glasses Wears Glasses: Reading only Patient Visual Report: No change from baseline Vision Assessment?: Yes Eye Alignment: Within Functional Limits Ocular Range of Motion: Within Functional Limits Alignment/Gaze Preference: Within Defined Limits Tracking/Visual Pursuits: Decreased smoothness of horizontal tracking;Decreased smoothness of vertical tracking Convergence: Impaired (comment) Visual Fields: No apparent deficits Perception  Perception: Impaired Inattention/Neglect: Does not attend to left side of body Spatial Orientation: still with slight pusher tendencies to the left Praxis Praxis: Impaired Praxis Impairment Details: Motor planning Cognition Overall Cognitive Status: Impaired/Different from baseline Arousal/Alertness: Awake/alert Attention: Sustained Focused Attention: Appears intact Sustained Attention: Appears intact Selective Attention: Impaired Memory: Impaired Memory Impairment: Retrieval deficit;Decreased short term memory Awareness: Impaired Awareness Impairment: Anticipatory impairment Problem Solving: Impaired Problem Solving Impairment: Functional basic;Verbal basic Organizing: Impaired Self Monitoring: Impaired Comments: Pt still with decreased carryover of hemi dressing techniques from day to day.  Needs min to mod instructional cueing to complete. Sensation Sensation Light Touch: Appears Intact Proprioception: Impaired Detail Proprioception Impaired Details: Impaired LUE Coordination Gross Motor Movements are Fluid and Coordinated: No Fine Motor Movements are Fluid and Coordinated: No Coordination and Movement Description: Pt with Brunnstrum stage III in the left arm and  hand.  Needs max assist for functional use as a gross assist or for washing the RUE or other parts of his body. Motor  Motor Motor: Hemiplegia;Abnormal postural alignment and control Motor - Discharge Observations: L hemiparesis with increased tone Mobility  Bed Mobility Bed Mobility: Sit to Supine;Supine to Sit Supine to Sit: Supervision/Verbal cueing Sit to Supine: Supervision/Verbal cueing Transfers Sit to Stand: Minimal Assistance - Patient > 75% Stand to Sit: Minimal Assistance - Patient > 75%  Trunk/Postural Assessment  Cervical Assessment Cervical Assessment: Exceptions to [facility] (forward head) Thoracic Assessment Thoracic Assessment: Exceptions to [facility] (rounded posture) Lumbar Assessment Lumbar Assessment: Exceptions to [facility] (posterior pelvic tilt)  Balance Balance Balance Assessed: Yes Static Sitting Balance Static Sitting - Balance Support: Feet supported Static Sitting - Level of Assistance: 7: Independent Dynamic Sitting Balance Dynamic Sitting - Balance Support: During functional activity Dynamic Sitting - Level of Assistance: 4: Min assist Static Standing Balance Static Standing - Balance Support: During functional activity Static Standing - Level of Assistance: 4: Min assist Dynamic Standing Balance Dynamic Standing - Level of Assistance: 4: Min assist Extremity/Trunk Assessment RUE Assessment RUE Assessment: Within Functional Limits LUE Assessment LUE Assessment: Exceptions to [facility] Passive Range of Motion (PROM) Comments: Limited by tone Active Range of Motion (AROM) Comments: Pt currently Brunnstrum stage II in the left hand with stage III in the left arm. General Strength Comments: Pt needs max hand over hand for integration of the LUE into any functional task.  trace digit flexion noted but not extension with increased tone in the flexors.  He is able to activate some shoulder flexion and elbow extension but limited by tone. LUE Body System: Neuro Brunstrum  levels for arm and hand: Arm;Hand Brunstrum level for arm: Stage III Synergy is performed voluntarily Brunstrum level for hand: Stage II Synergy is developing LUE Tone LUE Tone: Moderate LUE Tone Comments: Pt with moderate tone in the elbow flexors at a level 3 in the elbow flexors and  shoulder adductors with stage II in the digit flexors.   Kynan Peasley OTR/L 08/11/2020, 5:23 PM

## 2020-08-11 NOTE — Progress Notes (Signed)
Physical Therapy Discharge Summary  Patient Details  Name: Tyler Richards MRN: [id number] Date of Birth: Dec 25, 1950   Patient has met 6 of 8 long term goals due to improved activity tolerance, improved balance, improved postural control, increased strength, decreased pain, ability to compensate for deficits, functional use of  left upper extremity and left lower extremity, improved attention, improved awareness and improved coordination.  Patient to discharge at a wheelchair level [place] to perform transfers and supervision for w/c propulsion.  Patient's care partner attended hands-on training and education and is independent to provide the necessary physical and cognitive assistance at discharge.  Reasons goals not met: Mr. Eimers continues to require up to mod assist for ambulation at this time due continued L hemiparesis, L hypertonia, impaired balance, and pusher tendencies.  Recommendation:  Patient will benefit from ongoing skilled PT services in home health setting to continue to advance safe functional mobility, address ongoing impairments in L hemibody strength, dynamic sitting balance, standing balance, gait training, wheelchair propulsion, and minimize fall risk.  Equipment: [id number] wheelchair, family has all other necessary DME  Reasons for discharge: treatment goals met and discharge from hospital  Patient/family agrees with progress made and goals achieved: Yes  PT Discharge Precautions/Restrictions Precautions Precautions: Fall;Other (comment) Precaution Comments: left hemiparesis with left hemi inattention; monitor HR and BP, TEDs and abdominal binder when up Restrictions Weight Bearing Restrictions: No Pain Pain Assessment Pain Scale: Faces Faces Pain Scale: Hurts whole lot Pain Type: Acute pain Pain Location: Rectum Pain Orientation: Medial Pain Descriptors / Indicators: Sharp Pain Onset: On-going Pain Intervention(s): RN made aware;Repositioned Perception   Perception Perception: Impaired Inattention/Neglect: Does not attend to left side of body Spatial Orientation: still with slight pusher tendencies to the left that are most prominent in standing Praxis Praxis: Impaired Praxis Impairment Details: Motor planning  Cognition Overall Cognitive Status: Impaired/Different from baseline Arousal/Alertness: Awake/alert Orientation Level: Oriented X4 Attention: Focused;Sustained Focused Attention: Appears intact Sustained Attention: Appears intact Awareness: Impaired Problem Solving: Impaired Safety/Judgment: Appears intact Sensation Sensation Light Touch: Impaired Detail Light Touch Impaired Details: Impaired LLE Hot/Cold: Not tested Proprioception: Impaired Detail Proprioception Impaired Details: Impaired LUE;Impaired LLE Stereognosis: Not tested Coordination Gross Motor Movements are Fluid and Coordinated: No Fine Motor Movements are Fluid and Coordinated: No Coordination and Movement Description: continues to have L hemiparesis with more impairments in UE compared to LE along with hypertonia Motor  Motor Motor: Hemiplegia;Abnormal postural alignment and control Motor - Discharge Observations: L hemiparesis with increased tone  Mobility Bed Mobility Bed Mobility: Sit to Supine;Supine to Sit Supine to Sit: Supervision/Verbal cueing Sit to Supine: Supervision/Verbal cueing Transfers Transfers: Sit to Stand;Stand to Sit;Squat Pivot Transfers;Stand Pivot Transfers Sit to Stand: Minimal Assistance - Patient > 75% Stand to Sit: Minimal Assistance - Patient > 75% Stand Pivot Transfers: Minimal Assistance - Patient > 75% Stand Pivot Transfer Details: Verbal cues for technique;Verbal cues for precautions/safety;Verbal cues for sequencing Squat Pivot Transfers: Minimal Assistance - Patient > 75% Transfer (Assistive device): None Locomotion  Gait Ambulation: Yes Gait Assistance: Moderate Assistance - Patient 50-74% Gait Distance  (Feet): 25 Feet Assistive device: 1 person hand held assist Gait Assistance Details: Tactile cues for sequencing;Tactile cues for weight shifting;Tactile cues for placement;Verbal cues for gait pattern;Verbal cues for sequencing;Verbal cues for technique;Verbal cues for precautions/safety;Manual facilitation for weight shifting Gait Gait: Yes Gait Pattern: Impaired Gait Pattern: Decreased step length - right;Decreased step length - left;Decreased stride length;Decreased hip/knee flexion - left;Left flexed knee in stance;Poor foot clearance -  left;Wide base of support Gait velocity: decreased Stairs / Additional Locomotion Stairs: No Architect: Yes Wheelchair Assistance: Chartered loss adjuster: Right upper extremity;Right lower extremity Wheelchair Parts Management: Needs assistance Distance: 144ft  Trunk/Postural Assessment  Cervical Assessment Cervical Assessment: Exceptions to [facility] (forward head) Thoracic Assessment Thoracic Assessment: Exceptions to [facility] (rounded posture with slight L lateral flexion) Lumbar Assessment Lumbar Assessment: Exceptions to [facility] (posterior pelvic tilt) Postural Control Postural Control: Deficits on evaluation Protective Responses: delayed and insufficient for larger perturbations  Balance Balance Balance Assessed: Yes Static Sitting Balance Static Sitting - Balance Support: Feet supported Static Sitting - Level of Assistance: 7: Independent Dynamic Sitting Balance Dynamic Sitting - Balance Support: During functional activity Dynamic Sitting - Level of Assistance: 4: Min Insurance risk surveyor Standing - Balance Support: During functional activity Static Standing - Level of Assistance: 4: Min assist Dynamic Standing Balance Dynamic Standing - Balance Support: During functional activity;Right upper extremity supported Dynamic Standing - Level of Assistance: 4: Min assist;3: Mod  assist Extremity Assessment      RLE Assessment RLE Assessment: Within Functional Limits General Strength Comments: Grossly 4+/5 to 5/5 in hip, knee, and ankle LLE Assessment LLE Assessment: Exceptions to [facility] LLE Strength Left Hip Flexion: 3+/5 Left Knee Flexion: 3-/5 Left Knee Extension: 3+/5 Left Ankle Dorsiflexion: 3-/5 Left Ankle Plantar Flexion: 3-/5 LLE Tone LLE Tone: Mild;Hypertonic   Page Spiro, PT, DPT Debbora Dus 08/11/2020, 7:38 AM

## 2020-08-11 NOTE — Progress Notes (Signed)
Physical Therapy Session Note  Patient Details  Name: Tyler Richards MRN: [id number] Date of Birth: 1951-09-24  Today's Date: 08/11/2020 PT Individual Time: 3267-1245 PT Individual Time Calculation (min): 27 min   Short Term Goals: Week 5:  PT Short Term Goal 1 (Week 5): STG= LTG due to ELOS  Skilled Therapeutic Interventions/Progress Updates:    Patient received supine in bed, wife at bedside, agreeable to PT. He continues to report and perseverate on "intense" rectal pain and "gas pains" in belly related to constipation. PT donning TED hose with patient supine in bed, ModA to don pants supine in bed. Supervision provided and verbal cues for patient to come to sitting edge of bed. He remains with difficulty problem solving through putting on shirt appropriately and requires verbal cues for accuracy in this task. Patient demonstrating improved dynamic sitting balance with B LE supported on ground and minimal lateral sway noted, though he does require close supervision for safety. Patient transferring to wc via squat pivot with ModA- in crease in assistance likely related to increase in abdominal/rectal pain. Patient remaining up in wc, seatbelt alarm on, call light within reach.  Therapy Documentation Precautions:  Precautions Precautions: Fall, Other (comment) Precaution Comments: left hemiparesis with left hemi inattention; monitor HR and BP, TEDs and abdominal binder when up Restrictions Weight Bearing Restrictions: No    Therapy/Group: Individual Therapy  Karoline Caldwell, PT, DPT, CBIS  08/11/2020, 7:37 AM

## 2020-08-11 NOTE — Discharge Instructions (Signed)
Inpatient Rehab Discharge Instructions  Tyler Richards Discharge date and time: No discharge date for patient encounter.   Activities/Precautions/ Functional Status: Activity: activity as tolerated Diet: Dysphagia #1 thin liquids Wound Care:  Functional status: Routine skin checks ___ No restrictions     ___ Walk up steps independently ___ 24/7 supervision/assistance   ___ Walk up steps with assistance ___ Intermittent supervision/assistance  ___ Bathe/dress independently ___ Walk with walker     _x__ Bathe/dress with assistance ___ Walk Independently    ___ Shower independently ___ Walk with assistance    ___ Shower with assistance ___ No alcohol     ___ Return to work/school ________  COMMUNITY REFERRALS UPON DISCHARGE:    Home Health:   PT     OT     ST                   Agency: [facility] [facility] Phone: [phone]   Medical Equipment/Items Ordered: Wheelchair,                                                 Agency/Supplier: [employer]  Special Instructions: PCP follow up set up with [employer] [facility]. New patient appointment scheduled on Thursday, September 24, 2020 at 10:15 AM with Wilfred Lacy, AGNP-C  No driving smoking or alcohol STROKE/TIA DISCHARGE INSTRUCTIONS SMOKING Cigarette smoking nearly doubles your risk of having a stroke & is the single most alterable risk factor  If you smoke or have smoked in the last 12 months, you are advised to quit smoking for your health.  Most of the excess cardiovascular risk related to smoking disappears within a year of stopping.  Ask you doctor about anti-smoking medications  [place] Quit Line: 1-800-QUIT NOW  Free Smoking Cessation Classes [phone]  CHOLESTEROL Know your levels; limit fat & cholesterol in your diet  Lipid Panel     Component Value Date/Time   CHOL 149 06/30/2020 0043   TRIG 77 07/03/2020 0311   HDL 23 (L) 06/30/2020 0043   CHOLHDL 6.5 06/30/2020 0043   VLDL 33  06/30/2020 0043   LDLCALC 93 06/30/2020 0043      Many patients benefit from treatment even if their cholesterol is at goal.  Goal: Total Cholesterol (CHOL) less than 160  Goal:  Triglycerides (TRIG) less than 150  Goal:  HDL greater than 40  Goal:  LDL (LDLCALC) less than 100   BLOOD PRESSURE American Stroke Association blood pressure target is less that 120/80 mm/Hg  Your discharge blood pressure is:  BP: (!) 146/78  Monitor your blood pressure  Limit your salt and alcohol intake  Many individuals will require more than one medication for high blood pressure  DIABETES (A1c is a blood sugar average for last 3 months) Goal HGBA1c is under 7% (HBGA1c is blood sugar average for last 3 months)  Diabetes: No known diagnosis of diabetes    Lab Results  Component Value Date   HGBA1C 5.6 06/30/2020     Your HGBA1c can be lowered with medications, healthy diet, and exercise.  Check your blood sugar as directed by your physician  Call your physician if you experience unexplained or low blood sugars.  PHYSICAL ACTIVITY/REHABILITATION Goal is 30 minutes at least 4 days per week  Activity: Increase activity slowly, Therapies: Physical Therapy: Home Health  Return to work:   Activity decreases your risk of heart attack and stroke and makes your heart stronger.  It helps control your weight and blood pressure; helps you relax and can improve your mood.  Participate in a regular exercise program.  Talk with your doctor about the best form of exercise for you (dancing, walking, swimming, cycling).  DIET/WEIGHT Goal is to maintain a healthy weight  Your discharge diet is:  Diet Order            DIET - DYS 1 Room service appropriate? Yes with Assist; Fluid consistency: Thin  Diet effective now                 liquids Your height is:  Height: 6' (182.9 cm) Your current weight is: Weight: 86.2 kg Your Body Mass Index (BMI) is:  BMI (Calculated): 25.77  Following the type of diet  specifically designed for you will help prevent another stroke.  Your goal weight range is:    Your goal Body Mass Index (BMI) is 19-24.  Healthy food habits can help reduce 3 risk factors for stroke:  High cholesterol, hypertension, and excess weight.  RESOURCES Stroke/Support Group:  Call [phone]   STROKE EDUCATION PROVIDED/REVIEWED AND GIVEN TO PATIENT Stroke warning signs and symptoms How to activate emergency medical system (call 911). Medications prescribed at discharge. Need for follow-up after discharge. Personal risk factors for stroke. Pneumonia vaccine given:  Flu vaccine given:  My questions have been answered, the writing is legible, and I understand these instructions.  I will adhere to these goals & educational materials that have been provided to me after my discharge from the hospital.      My questions have been answered and I understand these instructions. I will adhere to these goals and the provided educational materials after my discharge from the hospital.  Patient/Caregiver Signature _______________________________ Date __________  Clinician Signature _______________________________________ Date __________  Please bring this form and your medication list with you to all your follow-up doctor's appointments.

## 2020-08-12 NOTE — Progress Notes (Signed)
[facility] PHYSICAL MEDICINE & REHABILITATION PROGRESS NOTE   Subjective/Complaints:  Excited about d/c  ROS:   Pt denies SOB, abd pain, CP, N/V/C/D   Objective:   No results found. No results for input(s): WBC, HGB, HCT, PLT in the last 72 hours. No results for input(s): NA, K, CL, CO2, GLUCOSE, BUN, CREATININE, CALCIUM in the last 72 hours.  Intake/Output Summary (Last 24 hours) at 08/12/2020 0751 Last data filed at 08/12/2020 0600 Gross per 24 hour  Intake 480 ml  Output --  Net 480 ml     Pressure Injury 08/06/20 Coccyx Mid Stage 1 -  Intact skin with non-blanchable redness of a localized area usually over a bony prominence. (Active)  08/06/20 0845  Location: Coccyx  Location Orientation: Mid  Staging: Stage 1 -  Intact skin with non-blanchable redness of a localized area usually over a bony prominence.  Wound Description (Comments):   Present on Admission:     Physical Exam: Vital Signs Blood pressure (!) 141/72, pulse (!) 56, temperature 98.4 F (36.9 C), temperature source Oral, resp. rate 18, height 6' (1.829 m), weight 76.8 kg, SpO2 99 %.  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 Functional deficits secondary to Right MCA infarct Stable for D/C today F/u PCP in 3-4 weeks F/u PM&R 2 weeks See D/C summary See D/C instructions  Care  Tool:  Bathing    Body parts bathed by patient: Left arm, Chest, Abdomen, Right upper leg, Left upper leg, Right lower leg, Face, Front perineal area, Left lower leg, Buttocks   Body parts bathed by helper: Right arm, Buttocks Body parts n/a: Buttocks   Bathing assist Assist Level: Minimal Assistance - Patient > 75%     Upper Body Dressing/Undressing Upper body dressing   What is the patient wearing?: Pull over shirt    Upper body assist Assist Level: Supervision/Verbal cueing    Lower Body Dressing/Undressing Lower body dressing      What is the patient wearing?: Pants     Lower body assist Assist for lower body dressing: Minimal Assistance - Patient > 75%     Toileting Toileting    Toileting assist Assist for toileting: Minimal Assistance - Patient > 75%     Transfers Chair/bed transfer  Transfers assist     Chair/bed transfer assist level: Moderate Assistance - Patient 50 - 74%     Locomotion Ambulation   Ambulation assist   Ambulation activity did not occur: Safety/medical concerns (required skilled intervention to ambulate 21ft at hallway rail)  Assist level: Moderate Assistance - Patient 50 - 74% Assistive device: Hand held assist Max distance: 25   Walk 10 feet activity   Assist  Walk 10 feet activity did not occur: Safety/medical concerns  Assist level: Minimal Assistance - Patient > 75% Assistive device: Hand held assist   Walk 50 feet activity   Assist Walk 50 feet with 2 turns activity did not  occur: Safety/medical concerns  Assist level: Moderate Assistance - Patient - 50 - 74% Assistive device: Hand held assist    Walk 150 feet activity   Assist Walk 150 feet activity did not occur: Safety/medical concerns  Assist level: 2 helpers Assistive device: Other (comment) (3 Musketeer)    Walk 10 feet on uneven surface  activity   Assist Walk 10 feet on uneven surfaces activity did not occur: Safety/medical concerns          Wheelchair     Assist Will patient use wheelchair at discharge?: Yes Type of Wheelchair: Manual    Wheelchair assist level: Supervision/Verbal cueing Max wheelchair distance: 100    Wheelchair 50 feet with 2 turns activity    Assist        Assist Level: Supervision/Verbal cueing   Wheelchair 150 feet activity     Assist      Assist Level: Moderate Assistance - Patient 50 - 74%   Medical Problem List and Plan: 1.  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  Continue CIR PT, OT, SLP Plan D/C today   2.  Antithrombotics: -DVT/anticoagulation: Venous Doppler studies negative.  SCDs.  No heparin given hemorrhage.             -antiplatelet therapy: Aspirin 81 mg daily, currently off of Brilinta given GIB and ICA has reoccluded 3. Pain Management: Tylenol as needed.   Complaints of left leg aching, worst at night, started after his stroke. Improved with gabapentin   4. Mood: Provide emotional support             -antipsychotic agents: N/A 5. Neuropsych: This patient is?  Fully capable of making decisions on his own behalf. 6. Skin/Wound Care: Routine skin checks 7. Fluids/Electrolytes/Nutrition: Routine in and outs d/c IV 8.  Constipation, resume colace, start senna HS as well. KUB shows constipation.    11/14- added miralax scheduled- KUB moderate stool, no ileus, increased colace dose 11/16 discussed that if stool gets too loose at home, colace can be reduced to 1 tab 9.  Hyperlipidemia.  Lipitor 10.  Gout.  Colchicine twice daily. Held due to diarrhea, most recently had flare up in foot and RIght elbow   Monitor for any gout flareups  Reasonably controlled , gouty tophi R>L 1st MTP-  No evidence of flare-up 11.  Orthostasis/bradycardia.  ProAmatine 5 mg every 8 hours as well as Florinef 0.1 mg daily.   Monitor with increased mobility.  Follow-up per cardiology services, Order orthostatic vitals  Vitals:   08/11/20 1917 08/12/20 0503  BP: 99/68 (!) 141/72  Pulse: 62 (!) 56  Resp: 17 18  Temp: 98.7 F (37.1 C) 98.4 F (36.9 C)  SpO2: 96% 99%  11/16 controlled 12.  Enterobacter UTI.  resolved 13.  Rectal bleeding.  Follow-up GI services.  Currently no plan for endoscopic studies and monitor hemoglobin hematocrit.   colonoscopy  As OP  Hemoglobin stable 14.  Spasticity Left hamstring and Left pectoralis which bothers pt at noc  Baclofen changed to Klonopin 0.25mg  qhs may be prn   Botox left pectoralis has reduced tone from MAS 3 to MAS 2, Botox the hamstring has reduced tone MAS 1.  Elbow flexor tone still at MAS 2/3.  Will likely need to repeat as outpatient 16.  Post stroke dysphagia  D2 thins, advance diet as tolerated- pt prefers  D1  LOS: 34 days A FACE TO FACE EVALUATION WAS PERFORMED  Charlett Blake 08/12/2020, 7:51 AM

## 2020-08-12 NOTE — Progress Notes (Signed)
Patient has been discharged home with wife, discharge instructions given by PA, education given, no complications noted at this time. Dayna Ramus, LPN

## 2020-08-12 NOTE — Progress Notes (Signed)
Inpatient Rehabilitation Care Coordinator  Discharge Note  The overall goal for the admission was met for:   Discharge location: Yes, Home  Length of Stay: Yes, 34 Days  Discharge activity level: Yes, Min A to Supervision  Home/community participation: Yes  Services provided included: MD, RD, PT, OT, SLP, RN, CM, TR, Pharmacy, [place]: Medicare  Follow-up services arranged: [facility]: [facility] [facility]  Comments (or additional information): PT OT [facility]   Wheelchair  Patient/Family verbalized understanding of follow-up arrangements: Yes  Individual responsible for coordination of the follow-up plan: [name] (Spouse), [phone]  Confirmed correct DME delivered: [name] 08/12/2020    [name]

## 2020-08-14 ENCOUNTER — Telehealth: Payer: Self-pay

## 2020-08-14 ENCOUNTER — Telehealth: Payer: Self-pay | Admitting: Registered Nurse

## 2020-08-14 NOTE — Telephone Encounter (Signed)
Transitional Care call Transitional Questions answered by Mrs. Tyler Richards.  Patient name: Tyler Richards  DOB: [birth year] 1. Are you/is patient experiencing any problems since coming home? No a. Are there any questions regarding any aspect of care? No 2. Are there any questions regarding medications administration/dosing? No a. Are meds being taken as prescribed? Yes b. "Patient should review meds with caller to confirm" Medication List Reviewed 3. Have there been any falls? No 4. Has Home Health been to the house and/or have they contacted you? Yes: Pre-discharge a. If not, have you tried to contact them? See Above b. Can we help you contact them? Yes: This provider placed a [facility] to [facility]: Left a message to call Tyler. Tyler Richards. Tyler Richards is aware of the above.  5. Are bowels and bladder emptying properly? Yes a. Are there any unexpected incontinence issues? No b. If applicable, is patient following bowel/bladder programs? NA 6. Any fevers, problems with breathing, unexpected pain? No 7. Are there any skin problems or new areas of breakdown? No 8. Has the patient/family member arranged specialty MD follow up (ie cardiology/neurology/renal/surgical/etc.)?  Tyler Richards has a scheduled appointment with PCP. [facility] office was closed when this provider call them. Tyler Richards was given their number, we will try again on Monday she verbalizes understanding.  a. Can we help arrange? Yes 9. Does the patient need any other services or support that we can help arrange? No 10. Are caregivers following through as expected in assisting the patient? Yes 11. Has the patient quit smoking, drinking alcohol, or using drugs as recommended? (                        )  Appointment date/time 08/26/2020  arrival time 2:20 for 2:40 appointment with Bayard Hugger ANP- C. At  [place]

## 2020-08-14 NOTE — Telephone Encounter (Signed)
I spoke to Tyler Richards at [facility] and gave her the ok, per [facility].

## 2020-08-14 NOTE — Telephone Encounter (Signed)
Darlene, clinical nurse manager, from [facility], calling to see if [facility] is willing to give orders for pt to have, PT,OT and speech therapy.  Nurse explains that pt had a recent stroke and would need this therapy before his new pt visit on 09/24/20 with Nche.  Please advise.  CB# [phone]

## 2020-08-14 NOTE — Telephone Encounter (Signed)
Ok to proceed. 

## 2020-08-17 ENCOUNTER — Telehealth: Payer: Self-pay | Admitting: *Deleted

## 2020-08-17 ENCOUNTER — Other Ambulatory Visit: Payer: Self-pay | Admitting: Physical Medicine and Rehabilitation

## 2020-08-17 MED ORDER — MAGNESIUM CITRATE PO SOLN
1.0000 | Freq: Once | ORAL | 3 refills | Status: AC
Start: 2020-08-17 — End: 2020-08-17

## 2020-08-17 MED ORDER — DOCUSATE SODIUM 283 MG RE ENEM: 1.0000 | ENEMA | Freq: Every day | RECTAL | 0 refills | Status: DC

## 2020-08-17 NOTE — Telephone Encounter (Signed)
I spoke with Mrs Oboyle again, and ?Dr Ranell Patrick has spoken with them.

## 2020-08-17 NOTE — Telephone Encounter (Signed)
I will call him today.

## 2020-08-17 NOTE — Telephone Encounter (Signed)
Mr Dyar called and is in severe pain when passing gas.  He is asking for an MD to call him. Dr Letta Pate is off today so I will ask if Dr Ranell Patrick can give him a call.

## 2020-08-17 NOTE — Telephone Encounter (Signed)
Tyler Richards called back and asked for a call back.  I called and got VM, so I let him know that Dr Letta Pate is out of the office and I have asked that Dr Ranell Patrick give him a call which she will do. In the meantime if he feels his pain is excruciating he would need to go to the ED to be evaluated. Otherwise he should stay by phone for call back.

## 2020-08-19 ENCOUNTER — Other Ambulatory Visit (HOSPITAL_COMMUNITY): Payer: Self-pay | Admitting: Interventional Radiology

## 2020-08-19 DIAGNOSIS — I639 Cerebral infarction, unspecified: Secondary | ICD-10-CM

## 2020-08-24 ENCOUNTER — Telehealth: Payer: Self-pay

## 2020-08-24 NOTE — Telephone Encounter (Signed)
Estill Bamberg from [facility] (PT) called and stated patient asked to have PT delayed on Friday to this week.   Start 11.29.21 if any questions - no number left

## 2020-08-25 ENCOUNTER — Other Ambulatory Visit: Payer: Self-pay

## 2020-08-25 ENCOUNTER — Ambulatory Visit (HOSPITAL_COMMUNITY)
Admission: RE | Admit: 2020-08-25 | Discharge: 2020-08-25 | Disposition: A | Discharge status: Home / Self Care | Payer: Medicare Other | Source: Ambulatory Visit | Attending: Interventional Radiology | Admitting: Interventional Radiology

## 2020-08-25 ENCOUNTER — Telehealth: Payer: Self-pay

## 2020-08-25 DIAGNOSIS — I639 Cerebral infarction, unspecified: Secondary | ICD-10-CM

## 2020-08-25 NOTE — Telephone Encounter (Signed)
Hospital discharge not reviewed: Per protocol verbal order given to [facility] PT with [facility]. 1 week 1, 2 week 6, 1 week 2 weeks for physical therapy.

## 2020-08-25 NOTE — Progress Notes (Signed)
Carotid artery duplex completed. Refer to "CV Proc" under chart review to view preliminary results.  08/25/2020 3:03 PM Kelby Aline., MHA, RVT, RDCS, RDMS

## 2020-08-26 ENCOUNTER — Encounter: Payer: Self-pay | Admitting: Registered Nurse

## 2020-08-26 ENCOUNTER — Encounter: Payer: Medicare Other | Attending: Registered Nurse | Admitting: Registered Nurse

## 2020-08-26 ENCOUNTER — Other Ambulatory Visit: Payer: Self-pay

## 2020-08-26 VITALS — BP 129/74 | HR 46 | Temp 98.0°F | Ht 72.0 in | Wt 185.0 lb

## 2020-08-26 DIAGNOSIS — I63511 Cerebral infarction due to unspecified occlusion or stenosis of right middle cerebral artery: Secondary | ICD-10-CM | POA: Diagnosis present

## 2020-08-26 DIAGNOSIS — I951 Orthostatic hypotension: Secondary | ICD-10-CM | POA: Diagnosis not present

## 2020-08-26 DIAGNOSIS — G8110 Spastic hemiplegia affecting unspecified side: Secondary | ICD-10-CM | POA: Insufficient documentation

## 2020-08-26 DIAGNOSIS — I639 Cerebral infarction, unspecified: Secondary | ICD-10-CM

## 2020-08-26 MED ORDER — MIDODRINE HCL 10 MG PO TABS: 10.0000 mg | ORAL_TABLET | Freq: Two times a day (BID) | ORAL | 0 refills | Status: DC

## 2020-08-26 MED ORDER — FLUDROCORTISONE ACETATE 0.1 MG PO TABS: 0.1500 mg | ORAL_TABLET | Freq: Two times a day (BID) | ORAL | 0 refills | Status: DC

## 2020-08-26 MED ORDER — PANTOPRAZOLE SODIUM 40 MG PO TBEC: 40.0000 mg | DELAYED_RELEASE_TABLET | Freq: Every day | ORAL | 0 refills | Status: DC

## 2020-08-26 MED ORDER — ATORVASTATIN CALCIUM 40 MG PO TABS: 40.0000 mg | ORAL_TABLET | Freq: Every day | ORAL | 0 refills | Status: DC

## 2020-08-26 MED ORDER — GABAPENTIN 300 MG PO CAPS: 300.0000 mg | ORAL_CAPSULE | Freq: Three times a day (TID) | ORAL | 0 refills | Status: DC

## 2020-08-26 NOTE — Progress Notes (Signed)
Subjective:    Patient ID: Tyler Richards, male    DOB: Dec 05, 1950, 69 y.o.   MRN: [id number]  HPI: Tyler Richards is a 69 y.o. male who is here for Transitional Care Visit for follow up of his Right MCA Infarction, Spastic Hemiparesis and Orthostasis.  [name] presented to [facility] via EMS on 06/29/2020, he had acute onset of left sided weakness.Upon EMS arrival to the home the patient was noted to be flaccid on the left, with left facial droop and right gaze deviation. Neurology was consulted.  CT Head Code Stroke: WO Contrast:  IMPRESSION: 1. Hyperdense distal right ICA and proximal MCA. 2. Blunted appearance of the posterior right putamen. Chronic right high frontal cortex infarcts. ASPECTS is 9 when excluding the chronic changes.  CTA W/WO Contrast:  IMPRESSION: 1. Emergent large vessel occlusion with no flow seen in the right internal carotid or proximal MCA. There is a 36 cc core infarct and 142 cc of penumbra by CT perfusion. 2. Atherosclerosis without flow limiting stenosis of the other major vessels. 3. Bilateral high-grade atheromatous PCA narrowings, more proximal on the right. 4. 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  [name] underwent right MCA thrombectomy with stent placement on on 06/29/2020 with Radiology.   [name] had a cardiology consult for bradycardia and maintained on midodrine.   [name] was admitted to inpatient rehabilitation on 07/09/2020 and discharged home on 08/12/2020. He is receiving Home Health Therapy with [facility]. He states he has left elbow and left lower extremity achy pain. He rates his pain 2. He also reports he has a good appetite.   This provider placed a call to [facility] Neurology to have his scheduled for HFU , appointment obtained. [name] understanding.    Pain Inventory Average Pain 6 Pain Right Now 2 My pain is intermittent and aching  LOCATION OF PAIN :  Left leg  BOWEL Number of stools per week: 7 Oral laxative use Yes  Type of laxative Miralax Enema or suppository use No  History of colostomy No  Incontinent No   BLADDER Normal In and out cath, frequency N/A Able to self cath No  Bladder incontinence No  Frequent urination No  Leakage with coughing No  Difficulty starting stream No  Incomplete bladder emptying No    Mobility walk with assistance how many minutes can you walk? Still in progress ability to climb steps?  no do you drive?  no use a wheelchair Do you have any goals in this area?  yes  Function retired  Neuro/Psych trouble walking anxiety  Prior Studies NEW PATIENT  Physicians involved in your care NEW PATIENT   History reviewed. No pertinent family history. Social History   Socioeconomic History  . Marital status: Married    Spouse name: Not on file  . Number of children: Not on file  . Years of education: Not on file  . Highest education level: Not on file  Occupational History  . Not on file  Tobacco Use  . Smoking status: Never Smoker  . Smokeless tobacco: Never Used  Vaping Use  . Vaping Use: Never used  Substance and Sexual Activity  . Alcohol use: Not Currently  . Drug use: Never  . Sexual activity: Not on file  Other Topics Concern  . Not on file  Social History Narrative  . Not on file   Social Determinants of Health   Financial Resource Strain:   .  Difficulty of Paying Living Expenses: Not on file  Food Insecurity:   . Worried About Charity fundraiser in the Last Year: Not on file  . Ran Out of Food in the Last Year: Not on file  Transportation Needs:   . Lack of Transportation (Medical): Not on file  . Lack of Transportation (Non-Medical): Not on file  Physical Activity:   . Days of Exercise per Week: Not on file  . Minutes of Exercise per Session: Not on file  Stress:   . Feeling of Stress : Not on file  Social Connections:   . Frequency of Communication  with Friends and Family: Not on file  . Frequency of Social Gatherings with Friends and Family: Not on file  . Attends Religious Services: Not on file  . Active Member of Clubs or Organizations: Not on file  . Attends Archivist Meetings: Not on file  . Marital Status: Not on file   Past Surgical History:  Procedure Laterality Date  . IR ANGIO INTRA EXTRACRAN SEL COM CAROTID INNOMINATE UNI L MOD SED  06/29/2020  . IR CT HEAD LTD  06/29/2020  . IR CT HEAD LTD  06/29/2020  . IR INTRAVSC STENT CERV CAROTID W/O EMB-PROT MOD SED INC ANGIO  06/29/2020  . IR PERCUTANEOUS ART THROMBECTOMY/INFUSION INTRACRANIAL INC DIAG ANGIO  06/29/2020  . RADIOLOGY WITH ANESTHESIA N/A 06/29/2020   Procedure: IR WITH ANESTHESIA;  Surgeon: Radiologist, Medication, MD;  Location: [place];  Service: Radiology;  Laterality: N/A;   Past Medical History:  Diagnosis Date  . Coronary artery disease   . Hypertension    BP 129/74   Pulse (!) 46   Temp 98 F (36.7 C)   Ht 6' (1.829 m)   Wt 185 lb (83.9 kg)   SpO2 97%   BMI 25.09 kg/m   Opioid Risk Score:   Fall Risk Score:  `1  Depression screen PHQ 2/9  Depression screen PHQ 2/9 08/26/2020  Decreased Interest 0  Down, Depressed, Hopeless 0  PHQ - 2 Score 0  Altered sleeping 3  Tired, decreased energy 0  Change in appetite 0  Feeling bad or failure about yourself  0  Trouble concentrating 0  Moving slowly or fidgety/restless 0  Suicidal thoughts 0  PHQ-9 Score 3   Review of Systems  Musculoskeletal: Positive for gait problem.  Neurological: Positive for weakness. Negative for numbness.  All other systems reviewed and are negative.      Objective:   Physical Exam Vitals and nursing note reviewed.  Constitutional:      Appearance: Normal appearance.  Cardiovascular:     Rate and Rhythm: Normal rate and regular rhythm.     Pulses: Normal pulses.     Heart sounds: Normal heart sounds.  Pulmonary:     Effort: Pulmonary effort is normal.      Breath sounds: Normal breath sounds.  Musculoskeletal:     Cervical back: Normal range of motion and neck supple.     Comments: Normal Muscle Bulk and Muscle Testing Reveals:  Upper Extremities: Right: Upper Extremity: Full ROM and Muscle Strength  5/5 Left Upper Extremity: Decreased ROM 45 Degrees and Muscle Strength 1/5 Wearing Abdominal Binder  Lower Extremities: Full ROM and Muscle Strength 5/5 Arrived in wheelchair, wife states she pivots him to wheelchair.  Mr. Shouse is not working in the home.    Skin:    General: Skin is warm and dry.  Neurological:     Mental  Status: He is alert and oriented to person, place, and time.  Psychiatric:        Mood and Affect: Mood normal.        Behavior: Behavior normal.           Assessment & Plan:   1. Right MCA Infarction: Spastic Hemiparesis: Continue Home Health Therapy with [facility]. He has a scheduled appointment with Neurology. Continue current medication regimen. Continue to Monitor.   2.Orthostasis. Continue current medication regimen. Continue wearing abdominal binder. He has a scheduled appointment with New PCP on 09/24/2020. Medications refill until he see's his PCP. Continue to Monitor.   F/U in 4- 6 weeks with Dr Letta Pate

## 2020-08-31 ENCOUNTER — Telehealth (HOSPITAL_COMMUNITY): Payer: Self-pay | Admitting: Radiology

## 2020-08-31 ENCOUNTER — Telehealth: Payer: Self-pay | Admitting: *Deleted

## 2020-08-31 NOTE — Telephone Encounter (Signed)
Called pt, per Deveshwar pt's next f/u will be US carotids in 6 months. Pt agrees with this plan. JM

## 2020-08-31 NOTE — Telephone Encounter (Signed)
[place] called to request POC 1wk4.  Approval given.

## 2020-09-01 ENCOUNTER — Telehealth: Payer: Self-pay

## 2020-09-01 NOTE — Telephone Encounter (Signed)
Verbal orders given to [place] OT with [facility]. Orders okayed for OT once a week for 9 weeks. To address ADL's & re-educate nero to the left side.

## 2020-09-10 ENCOUNTER — Other Ambulatory Visit: Payer: Self-pay

## 2020-09-10 ENCOUNTER — Ambulatory Visit (INDEPENDENT_AMBULATORY_CARE_PROVIDER_SITE_OTHER): Payer: Medicare Other | Admitting: Adult Health

## 2020-09-10 ENCOUNTER — Encounter: Payer: Self-pay | Admitting: Adult Health

## 2020-09-10 VITALS — BP 135/71 | HR 43 | Ht 72.0 in | Wt 185.0 lb

## 2020-09-10 DIAGNOSIS — I6521 Occlusion and stenosis of right carotid artery: Secondary | ICD-10-CM | POA: Diagnosis not present

## 2020-09-10 DIAGNOSIS — I63511 Cerebral infarction due to unspecified occlusion or stenosis of right middle cerebral artery: Secondary | ICD-10-CM

## 2020-09-10 DIAGNOSIS — E785 Hyperlipidemia, unspecified: Secondary | ICD-10-CM | POA: Diagnosis not present

## 2020-09-10 NOTE — Patient Instructions (Signed)
Continue working with home health therapies and consider doing additional outpatient therapies once completed if needed  Continue aspirin 81 mg daily  and atorvastatin 40mg  daily  for secondary stroke prevention  Continue to follow up with PCP regarding cholesterol and blood pressure management  Maintain strict control of hypertension with blood pressure goal between 130-150 and cholesterol with LDL cholesterol (bad cholesterol) goal below 70 mg/dL.       Followup in the future with me in 3 months or call earlier if needed       Thank you for coming to see [place] at [facility]. I hope we have been able to provide you high quality care today.  You may receive a patient satisfaction survey over the next few weeks. We would appreciate your feedback and comments so that we may continue to improve ourselves and the health of our patients.

## 2020-09-10 NOTE — Progress Notes (Signed)
[facility] [address] [place]. [place] [address] [phone]       HOSPITAL FOLLOW UP NOTE  [name] Date of Birth:  1951/04/07 Medical Record Number:  [id number]   Reason for Referral:  hospital stroke follow up    SUBJECTIVE:   CHIEF COMPLAINT:  Chief Complaint  Patient presents with  . Hospitalization Follow-up    Tx rm, with wife, states he is doing well in therapy, uses wheelchair , possible gout flare up in left hand      HPI:   Tyler Richards is a 69 y.o. male with history of gout admitted on 06/29/2020 for left-sided weakness, right gaze, left facial droop with fall.  Personally reviewed hospitalization pertinent progress notes, lab work and imaging with summary provided.  Initially evaluated by Dr. Erlinda Richards with stroke work-up revealing right MCA infarct with hemorrhagic conversion due to right ICA and MCA occlusion s/p IR with TICI 2C reperfusion and carotid stenting with reocclusion, secondary to large vessel disease source.  Initially placed on aspirin and Brilinta Brilinta discontinued due to LGIB and stent reocclusion.  Hypotension and severe sinus bradycardia likely in setting of carotid stenting with use of Levophed, midodrine and Florinef with long-term BP goal 130-150 given occluded right ICA.  LDL 93 and initiate atorvastatin 40 mg daily.  Other stroke risk factors include advanced age but no prior stroke history.  Other active problems include LGIB followed by GI, fever, gout, hypokalemia and left knee and right elbow pain.  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 Evolution R frontoinsular infarcts. R ICA occluded.  Carotid Doppler confirmed right ICA re-occluded  2D Echo EF 50 to 55%  LE venous Doppler no DVT  LDL 93  HgbA1c 5.6  Off Heparin sq at request of GI given BRBPR, SCDsfor VTE prophylaxis    No antithrombotic prior to admission, now on aspirin 81 mg daily  (off Brilinta given GIB and ICA has re-occluded)  Therapy recommendations:  CIR  Disposition:  CIR   Today, 09/10/2020, Tyler Richards is being seen for hospital follow-up accompanied by his wife.  Reports residual left-sided weakness with spasticity and mild dysarthria with continued improvement.  Denies residual dysphagia currently on regular diet without difficulty.  Currently working with home health PT/OT/SLP.  He has nonambulatory but able to stand/pivot for transfers.  Experiencing left hand pain and questions if this is from his gout or possibly stroke related.  Denies new stroke/TIA symptoms. On aspirin 81mg  daily without bleeding or bruising. On atorvastatin 40mg  daily without myalgias. Blood pressure today 135/71 - monitors at home and typically 130s/80s per wife. Continues on florinef and midodrine. Recent CUS showed continued R ICA stent occlusion and L ICA 1-39% stenosis - plans on f/u with [facility] in 6 months. No further concerns at this time.       ROS:   14 system review of systems performed and negative with exception of those listed in HPI  PMH:  Past  Medical History:  Diagnosis Date  . Coronary artery disease   . Hypertension     PSH:  Past Surgical History:  Procedure Laterality Date  . IR ANGIO INTRA EXTRACRAN SEL COM CAROTID INNOMINATE UNI L MOD SED  06/29/2020  . IR CT HEAD LTD  06/29/2020  . IR CT HEAD LTD  06/29/2020  . IR INTRAVSC STENT CERV CAROTID W/O EMB-PROT MOD SED INC ANGIO  06/29/2020  . IR PERCUTANEOUS ART  THROMBECTOMY/INFUSION INTRACRANIAL INC DIAG ANGIO  06/29/2020  . RADIOLOGY WITH ANESTHESIA N/A 06/29/2020   Procedure: IR WITH ANESTHESIA;  Surgeon: Radiologist, Medication, MD;  Location: [place];  Service: Radiology;  Laterality: N/A;    Social History:  Social History   Socioeconomic History  . Marital status: Married    Spouse name: Not on file  . Number of children: Not on file  . Years of education: Not on file  . Highest education level: Not on file  Occupational History  . Not on file  Tobacco Use  . Smoking status: Never Smoker  . Smokeless tobacco: Never Used  Vaping Use  . Vaping Use: Never used  Substance and Sexual Activity  . Alcohol use: Not Currently  . Drug use: Never  . Sexual activity: Not on file  Other Topics Concern  . Not on file  Social History Narrative  . Not on file   Social Determinants of Health   Financial Resource Strain: Not on file  Food Insecurity: Not on file  Transportation Needs: Not on file  Physical Activity: Not on file  Stress: Not on file  Social Connections: Not on file  Intimate Partner Violence: Not on file    Family History: No family history on file.  Medications:   Current Outpatient Medications on File Prior to Visit  Medication Sig Dispense Refill  . acetaminophen (TYLENOL) 325 MG tablet Take 650 mg by mouth every 6 (six) hours as needed for mild pain or headache.    Marland Kitchen aspirin EC 81 MG tablet Take 81 mg by mouth daily as needed for mild pain. Swallow whole.    Marland Kitchen atorvastatin (LIPITOR) 40 MG tablet Take 1 tablet (40 mg total) by mouth daily. 30 tablet 0  . dibucaine (NUPERCAINAL) 1 % ointment Apply topically as needed. Apply to affected area 30 g 0  . diclofenac Sodium (VOLTAREN) 1 % GEL Apply 2 g topically 3 (three) times daily. 2 g 0  . dicyclomine (BENTYL) 20 MG tablet Take by mouth.    . fludrocortisone (FLORINEF) 0.1 MG tablet Take 1.5 tablets (0.15 mg total) by mouth 2 (two) times daily. 60 tablet 0  . gabapentin  (NEURONTIN) 300 MG capsule Take 1 capsule (300 mg total) by mouth 3 (three) times daily. 90 capsule 0  . midodrine (PROAMATINE) 10 MG tablet Take 1 tablet (10 mg total) by mouth 2 (two) times daily with a meal. 60 tablet 0  . pantoprazole (PROTONIX) 40 MG tablet Take 1 tablet (40 mg total) by mouth daily. 30 tablet 0  . polyethylene glycol (MIRALAX / GLYCOLAX) 17 g packet Take 17 g by mouth daily. 14 each 0  . saccharomyces boulardii (FLORASTOR) 250 MG capsule Take 1 capsule (250 mg total) by mouth 2 (two) times daily. 60 capsule 0  . senna (SENOKOT) 8.6 MG TABS tablet Take 2 tablets (17.2 mg total) by mouth at bedtime. 120 tablet 0  . witch hazel-glycerin (TUCKS) pad Apply topically as needed for itching. Apply as needed 40 each 12  No current facility-administered medications on file prior to visit.    Allergies:  No Known Allergies    OBJECTIVE:  Physical Exam  Vitals:   09/10/20 1423  Weight: 185 lb (83.9 kg)  Height: 6' (1.829 m)   Body mass index is 25.09 kg/m. No exam data present   Post stroke PHQ 2/9 Depression screen PHQ 2/9 08/26/2020  Decreased Interest 0  Down, Depressed, Hopeless 0  PHQ - 2 Score 0  Altered sleeping 3  Tired, decreased energy 0  Change in appetite 0  Feeling bad or failure about yourself  0  Trouble concentrating 0  Moving slowly or fidgety/restless 0  Suicidal thoughts 0  PHQ-9 Score 3     General: well developed, well nourished,  pleasant elderly Caucasian male, seated, in no evident distress Head: head normocephalic and atraumatic.   Neck: supple with no carotid or supraclavicular bruits Cardiovascular: regular rate and rhythm, no murmurs Musculoskeletal: no deformity Skin:  no rash/petichiae Vascular:  Normal pulses all extremities   Neurologic Exam Mental Status: Awake and fully alert. Mild dysarthria. Oriented to place and time. Recent and remote memory intact. Attention span, concentration and fund of knowledge during visit.  Mood and affect appropriate.  Cranial Nerves: Fundoscopic exam reveals sharp disc margins. Pupils equal, briskly reactive to light. Extraocular movements full without nystagmus. Visual fields full to confrontation. Hearing intact. Facial sensation intact.  Left lower facial weakness.  Tongue and palate moves normally and symmetrically.  Motor: Normal bulk and tone and strength right upper and lower extremity LUE: 4/5 proximal and 1/5 distal with increased spasticity LLE: 4+/5 proximal and 4/5 ankle dorsiflexion Sensory.: intact to touch , pinprick , position and vibratory sensation.  Coordination: Rapid alternating movements normal on right side. Finger-to-nose and heel-to-shin performed accurately on right side. Gait and Station: Nonambulatory Reflexes: 2+ LUE and LLE ; 1+ RUE and RLE. Toes downgoing.     NIHSS  5 Modified Rankin  3-4      ASSESSMENT: Tyler Richards is a 69 y.o. year old male presented with left-sided weakness, right gaze preference, left facial droop and fall on 06/29/2020 with stroke work-up revealing right MCA infarct due to right ICA and MCA occlusion s/p IR with TICI 2C reperfusion and carotid stenting which reoccluded, infarct secondary to large vessel disease source. Vascular risk factors include intracranial stenosis, R carotid occlusion, hypotension, HLD and age.      PLAN:  1. R MCA stroke :  a. Residual deficit: Left spastic hemiparesis, left facial weakness, gait impairment and mild dysarthria.  Continue working with home health therapies and may potentially transition to outpatient therapies once completed if indicated.  Continue to follow with Dr. Letta Pate with plans on Botox injections next month.   b. Continue aspirin 81 mg daily  and atorvastatin 40 mg daily for secondary stroke prevention.   c. Discussed secondary stroke prevention measures and importance of close PCP follow up for aggressive stroke risk factor management  2. R carotid occlusion:  recent CUS 07/2020 showed stable stent occlusion - per wife, plans on f/u with [facility] in 6 months with repeat CUS  3. Hypotension: BP goal 130-150 for occluded right ICA.  Stable on midodrine and Florinef per PCP 4. HLD: LDL goal <70. Recent LDL 93. initiated atorvastatin 40mg  daily during stroke admission. Request f/u with PCP in the next 1-2 months for repeat lipid panel and ongoing prescribing of atorvastatin.     Follow up in 3 months or call earlier  if needed   I spent 45 minutes of face-to-face and non-face-to-face time with patient and wife.  This included previsit chart review including recent hospitalization progress notes, lab work and imaging, lab review, study review, order entry, electronic health record documentation, patient and wife discussion and education regarding recent stroke including etiology, residual deficits, importance of managing stroke risk factors and answered all other questions to patient and wifes satisfaction   Frann Rider, AGNP-BC  [facility] [address] [place], [place] [address]  Phone [phone] Fax [phone] Note: This document was prepared with digital dictation and possible smart phrase technology. Any transcriptional errors that result from this process are unintentional.

## 2020-09-15 NOTE — Progress Notes (Signed)
I agree with the above plan 

## 2020-09-21 ENCOUNTER — Telehealth: Payer: Self-pay

## 2020-09-21 MED ORDER — FLUDROCORTISONE ACETATE 0.1 MG PO TABS: 0.1500 mg | ORAL_TABLET | Freq: Two times a day (BID) | ORAL | 0 refills | Status: DC

## 2020-09-21 NOTE — Telephone Encounter (Signed)
Patient called stating that she saw Danella Sensing, NP a month ago and she refill Fludrocortisone for her until she can get in with new PCP. Patient states she needs #9 to get her to her appt with PCP on the 30th. Dr. Letta Pate patient and he is out of the office today. Can you address this message?

## 2020-09-21 NOTE — Telephone Encounter (Signed)
I sent in the # 9 tablets.  WHen Riley Lam saw Mr Gatliff the sig for the Florinef is 1.5 tablets bid.  Her disp was #60 and they do not have enough to cover until the 09/24/20 appt with the new PCP. I have let her know that the #9 tablets were sent in.

## 2020-09-24 ENCOUNTER — Other Ambulatory Visit (INDEPENDENT_AMBULATORY_CARE_PROVIDER_SITE_OTHER): Payer: Medicare Other

## 2020-09-24 ENCOUNTER — Other Ambulatory Visit: Payer: Self-pay

## 2020-09-24 ENCOUNTER — Encounter: Payer: Self-pay | Admitting: Nurse Practitioner

## 2020-09-24 ENCOUNTER — Telehealth (INDEPENDENT_AMBULATORY_CARE_PROVIDER_SITE_OTHER): Payer: Medicare Other | Admitting: Nurse Practitioner

## 2020-09-24 VITALS — BP 134/80 | Ht 72.0 in

## 2020-09-24 DIAGNOSIS — E876 Hypokalemia: Secondary | ICD-10-CM

## 2020-09-24 DIAGNOSIS — G903 Multi-system degeneration of the autonomic nervous system: Secondary | ICD-10-CM

## 2020-09-24 DIAGNOSIS — I63511 Cerebral infarction due to unspecified occlusion or stenosis of right middle cerebral artery: Secondary | ICD-10-CM

## 2020-09-24 DIAGNOSIS — K259 Gastric ulcer, unspecified as acute or chronic, without hemorrhage or perforation: Secondary | ICD-10-CM | POA: Insufficient documentation

## 2020-09-24 DIAGNOSIS — Z7952 Long term (current) use of systemic steroids: Secondary | ICD-10-CM | POA: Insufficient documentation

## 2020-09-24 DIAGNOSIS — I951 Orthostatic hypotension: Secondary | ICD-10-CM | POA: Insufficient documentation

## 2020-09-24 DIAGNOSIS — R739 Hyperglycemia, unspecified: Secondary | ICD-10-CM | POA: Diagnosis not present

## 2020-09-24 DIAGNOSIS — D500 Iron deficiency anemia secondary to blood loss (chronic): Secondary | ICD-10-CM

## 2020-09-24 DIAGNOSIS — D509 Iron deficiency anemia, unspecified: Secondary | ICD-10-CM | POA: Insufficient documentation

## 2020-09-24 DIAGNOSIS — K254 Chronic or unspecified gastric ulcer with hemorrhage: Secondary | ICD-10-CM | POA: Diagnosis not present

## 2020-09-24 DIAGNOSIS — K922 Gastrointestinal hemorrhage, unspecified: Secondary | ICD-10-CM

## 2020-09-24 DIAGNOSIS — K5909 Other constipation: Secondary | ICD-10-CM

## 2020-09-24 DIAGNOSIS — K219 Gastro-esophageal reflux disease without esophagitis: Secondary | ICD-10-CM | POA: Insufficient documentation

## 2020-09-24 HISTORY — DX: Long term (current) use of systemic steroids: Z79.52

## 2020-09-24 MED ORDER — SENNA 8.6 MG PO TABS: 2.0000 | ORAL_TABLET | Freq: Every evening | ORAL | 0 refills | Status: DC | PRN

## 2020-09-24 MED ORDER — PANTOPRAZOLE SODIUM 40 MG PO TBEC: 40.0000 mg | DELAYED_RELEASE_TABLET | Freq: Every day | ORAL | 0 refills | Status: DC

## 2020-09-24 MED ORDER — MIDODRINE HCL 10 MG PO TABS: 10.0000 mg | ORAL_TABLET | Freq: Two times a day (BID) | ORAL | 5 refills | Status: DC

## 2020-09-24 MED ORDER — FLUDROCORTISONE ACETATE 0.1 MG PO TABS: 0.1000 mg | ORAL_TABLET | Freq: Two times a day (BID) | ORAL | 5 refills | Status: DC

## 2020-09-24 NOTE — Assessment & Plan Note (Signed)
Normal BM without melena and hematochezia Use of senna and colace prn

## 2020-09-24 NOTE — Progress Notes (Signed)
Virtual Visit via Telephone Note  I connected with Stark Bray on 09/24/20 at 10:15 AM EST by telephone and verified that I am speaking with the correct person using two identifiers.  Location: Patient: home Provider:office Participants: provider, patient and Ms. Kandice Robinsons.  I discussed the limitations, risks, security and privacy concerns of performing an evaluation and management service by telephone and the availability of in person appointments. I also discussed with the patient that there may be a patient responsible charge related to this service. The patient expressed understanding and agreed to proceed.  CC": medication refill, hypotension, hyperlipidemia and hyperglycemia f/up.  History of Present Illness: All information provided by Ms. Kandice Robinsons. Ongoing home PT/OT and speech therapy Also followed by Dr. Read Drivers due to chronic pain, use of gabapentin to manage pain. Neurogenic orthostatic hypotension ([place]) Also followed by neurology, last OV 09/10/2020 Echocardiogram completed 06/2020: normal EF, no LVH, grade 1 diastolic dysfunction. Carotid US 07/2020: occluded right ICA and 1-39% left ICA stenosis. Stable BP with use of midodrine and florinef. No headache or dizziness or LE edema or abd pain BP Readings from Last 3 Encounters:  09/24/20 134/80  09/10/20 135/71  08/26/20 129/74   Repeat BMP, LFT, and TSH. Maintain current medication F/up in 29month  Other constipation Normal BM without melena and hematochezia Use of senna and colace prn  Observations/Objective: Limited due to telephone encounter and information provided by spouse.  Assessment and Plan: Christina was seen today for establish care.  Diagnoses and all orders for this visit:  Neurogenic orthostatic hypotension (HCC) -     fludrocortisone (FLORINEF) 0.1 MG tablet; Take 1 tablet (0.1 mg total) by mouth 2 (two) times daily. -     midodrine (PROAMATINE) 10 MG tablet; Take 1 tablet (10 mg total) by mouth 2  (two) times daily with a meal. -     Cancel: Basic metabolic panel -     Cancel: Basic metabolic panel; Future -     Basic metabolic panel; Future  Right middle cerebral artery stroke (HCC)  Gastric ulcer with hemorrhage, unspecified chronicity -     pantoprazole (PROTONIX) 40 MG tablet; Take 1 tablet (40 mg total) by mouth daily.  Hyperglycemia -     Cancel: Hemoglobin A1c -     Cancel: Hemoglobin A1c; Future -     Hemoglobin A1c; Future  Other constipation -     senna (SENOKOT) 8.6 MG TABS tablet; Take 2 tablets (17.2 mg total) by mouth at bedtime as needed for mild constipation.  Iron deficiency anemia due to chronic blood loss -     Cancel: CBC w/Diff -     Cancel: CBC w/Diff; Future -     CBC w/Diff; Future  Long-term corticosteroid use -     Cancel: Hemoglobin A1c -     Cancel: Hepatic function panel -     Cancel: Hemoglobin A1c; Future -     Cancel: Hepatic function panel; Future -     Hepatic function panel; Future -     Hemoglobin A1c; Future   Follow Up Instructions: See above   I discussed the assessment and treatment plan with the patient. The patient was provided an opportunity to ask questions and all were answered. The patient agreed with the plan and demonstrated an understanding of the instructions.   The patient was advised to call back or seek an in-person evaluation if the symptoms worsen or if the condition fails to improve as anticipated.  I provided 106minutes of  non-face-to-face time during this encounter.  Alysia Penna, NP

## 2020-09-24 NOTE — Assessment & Plan Note (Addendum)
Also followed by neurology, last OV 09/10/2020 Echocardiogram completed 06/2020: normal EF, no LVH, grade 1 diastolic dysfunction. Carotid US 07/2020: occluded right ICA and 1-39% left ICA stenosis. Stable BP with use of midodrine and florinef. No headache or dizziness or LE edema or abd pain BP Readings from Last 3 Encounters:  09/24/20 134/80  09/10/20 135/71  08/26/20 129/74   Repeat BMP, LFT, and TSH. Maintain current medication F/up in 50month

## 2020-09-25 LAB — HEPATIC FUNCTION PANEL
AG Ratio: 1.3 (calc) (ref 1.0–2.5)
ALT: 8 U/L — ABNORMAL LOW (ref 9–46)
AST: 16 U/L (ref 10–35)
Albumin: 3.4 g/dL — ABNORMAL LOW (ref 3.6–5.1)
Alkaline phosphatase (APISO): 64 U/L (ref 35–144)
Bilirubin, Direct: 0.1 mg/dL (ref 0.0–0.2)
Globulin: 2.6 g/dL (calc) (ref 1.9–3.7)
Indirect Bilirubin: 0.3 mg/dL (calc) (ref 0.2–1.2)
Total Bilirubin: 0.4 mg/dL (ref 0.2–1.2)
Total Protein: 6 g/dL — ABNORMAL LOW (ref 6.1–8.1)

## 2020-09-25 LAB — BASIC METABOLIC PANEL
BUN/Creatinine Ratio: 16 (calc) (ref 6–22)
BUN: 10 mg/dL (ref 7–25)
CO2: 30 mmol/L (ref 20–32)
Calcium: 9.1 mg/dL (ref 8.6–10.3)
Chloride: 103 mmol/L (ref 98–110)
Creat: 0.61 mg/dL — ABNORMAL LOW (ref 0.70–1.18)
Glucose, Bld: 97 mg/dL (ref 65–99)
Potassium: 3.2 mmol/L — ABNORMAL LOW (ref 3.5–5.3)
Sodium: 142 mmol/L (ref 135–146)

## 2020-09-25 LAB — CBC WITH DIFFERENTIAL/PLATELET
Absolute Monocytes: 517 cells/uL (ref 200–950)
Basophils Absolute: 29 cells/uL (ref 0–200)
Basophils Relative: 0.7 %
Eosinophils Absolute: 62 cells/uL (ref 15–500)
Eosinophils Relative: 1.5 %
HCT: 34.6 % — ABNORMAL LOW (ref 38.5–50.0)
Hemoglobin: 12 g/dL — ABNORMAL LOW (ref 13.2–17.1)
Lymphs Abs: 1529 cells/uL (ref 850–3900)
MCH: 33.2 pg — ABNORMAL HIGH (ref 27.0–33.0)
MCHC: 34.7 g/dL (ref 32.0–36.0)
MCV: 95.8 fL (ref 80.0–100.0)
MPV: 10.4 fL (ref 7.5–12.5)
Monocytes Relative: 12.6 %
Neutro Abs: 1964 cells/uL (ref 1500–7800)
Neutrophils Relative %: 47.9 %
Platelets: 268 10*3/uL (ref 140–400)
RBC: 3.61 10*6/uL — ABNORMAL LOW (ref 4.20–5.80)
RDW: 13.2 % (ref 11.0–15.0)
Total Lymphocyte: 37.3 %
WBC: 4.1 10*3/uL (ref 3.8–10.8)

## 2020-09-25 LAB — HEMOGLOBIN A1C
Hgb A1c MFr Bld: 4.7 % of total Hgb (ref <5.7)
Mean Plasma Glucose: 88 mg/dL
eAG (mmol/L): 4.9 mmol/L

## 2020-09-28 MED ORDER — POTASSIUM CHLORIDE CRYS ER 20 MEQ PO TBCR: 40.0000 meq | EXTENDED_RELEASE_TABLET | Freq: Every day | ORAL | 0 refills | Status: DC

## 2020-09-28 NOTE — Addendum Note (Signed)
Addended by: Alysia Penna L on: 09/28/2020 02:40 PM   Modules accepted: Orders

## 2020-10-01 ENCOUNTER — Other Ambulatory Visit: Payer: Self-pay

## 2020-10-01 ENCOUNTER — Encounter: Payer: Medicare Other | Attending: Physical Medicine & Rehabilitation | Admitting: Physical Medicine & Rehabilitation

## 2020-10-01 ENCOUNTER — Encounter: Payer: Self-pay | Admitting: Physical Medicine & Rehabilitation

## 2020-10-01 VITALS — BP 173/82 | HR 41 | Temp 98.5°F

## 2020-10-01 DIAGNOSIS — I63511 Cerebral infarction due to unspecified occlusion or stenosis of right middle cerebral artery: Secondary | ICD-10-CM | POA: Diagnosis not present

## 2020-10-01 DIAGNOSIS — G8110 Spastic hemiplegia affecting unspecified side: Secondary | ICD-10-CM | POA: Insufficient documentation

## 2020-10-01 MED ORDER — ATORVASTATIN CALCIUM 40 MG PO TABS: 40.0000 mg | ORAL_TABLET | Freq: Every day | ORAL | 0 refills | Status: DC

## 2020-10-01 NOTE — Patient Outreach (Signed)
[facility]) Care Management  10/01/2020  Tyler Richards 07-22-1951 [id number]   First telephone outreach attempt to obtain mRS. No answer. Left message for returned call.  Vanice Sarah [facility] Management Assistant [phone]

## 2020-10-01 NOTE — Progress Notes (Signed)
Subjective:    Patient ID: Tyler Richards, male    DOB: April 05, 1951, 70 y.o.   MRN: [id number] 70 y.o. right-handed male with unremarkable past medical history no prescription medications.  Per chart review lives with spouse independent prior to admission and active.  1 level home 2 steps to entry.  Presented 06/29/2020 with acute onset of left-sided weakness and slurred speech.  Cranial CT scan showed hyperdense distal right ICA and proximal MCA.  Blunted appearance of the posterior right putamen.  Chronic right high frontal cortex infarct.  Patient did not receive TPA.  CT angiogram of head and neck emergent large vessel occlusion with no flow seen in the right internal carotid artery or proximal MCA.  Patient underwent right MCA thrombectomy right ICA stent placement 06/29/2020 per interventional radiology.  Most recent MRI and imaging revealed acute infarct right basal ganglia with nonprogressive hemorrhage when correlated with a prior CT of 06/29/2020.  Lower extremity Dopplers no DVT.  Carotid Dopplers no ICA stenosis.  Echocardiogram with ejection fraction of 50 to [id number] grade 1 diastolic dysfunction.  Admission chemistries unremarkable aside from glucose 104 urine drug screen negative.  Patient did receive cardiology consult for bradycardia consistent with hyper vagotonia and maintained on ProAmatine.  EKG normal sinus rhythm 70s to 80s occasional sinus bradycardia.  Patient initially maintained on aspirin as well as Brilinta.  Extubated 06/30/2020.  Urine culture greater 100,000 Enterobacter placed on Maxipime changed to Bactrim.  Gastroenterology services consulted due to some bright red blood with bowel movement currently holding off on any endoscopic evaluation monitoring hemoglobin hematocrit with latest hemoglobin 9.8.  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  Rehab LOS 34d Receiving HH PT, OT, SLP via [facility].   Just discharged from [facility]. No falls at home.  Pain controlled , using voltaren gel to knee at night  Bowels and bladder functioning well The patient indicates his wife still assist with bathing and dressing.  She also does medication management for him. Wife is in the lobby and was not admitted to the office because of slightly elevated temperature The patient feels like his left arm and left leg are getting tighter again.  We reviewed his Botox injection performed while he was an inpatient on 07/17/2020.  120 units were injected into the left pectoralis 40 units into the left biceps and 200 units in the left hamstrings.  Patient tolerated procedure well. Pain Inventory Average Pain 5 Pain Right Now 5 My pain is aching  LOCATION OF PAIN  Hand, arm  BOWEL Number of stools per week: 3 to 4 Oral laxative use No  Type of laxative nione Enema or suppository use No  History of colostomy No  Incontinent No   BLADDER Normal In and out cath, frequency na Able to self cath na Bladder incontinence No  Frequent urination No  Leakage with coughing No  Difficulty starting stream No  Incomplete bladder emptying No    Mobility walk with assistance ability to climb steps?  no do you drive?  no use a wheelchair  Function retired  Neuro/Psych No problems in this area  Prior Studies Any changes since last visit?  yes  Physicians involved in  your care Any changes since last visit?  yes Primary care Bonna [facility] [place]   No family history on file. Social History   Socioeconomic History  . Marital status: Married    Spouse name: Not on file  . Number of children: Not on file  . Years of education: Not on file  .  Highest education level: Not on file  Occupational History  . Not on file  Tobacco Use  . Smoking status: Never Smoker  . Smokeless tobacco: Never Used  Vaping Use  . Vaping Use: Never used  Substance and Sexual Activity  . Alcohol use: Not Currently  . Drug use: Never  . Sexual activity: Not on file  Other Topics Concern  . Not on file  Social History Narrative  . Not on file   Social Determinants of Health   Financial Resource Strain: Not on file  Food Insecurity: Not on file  Transportation Needs: Not on file  Physical Activity: Not on file  Stress: Not on file  Social Connections: Not on file   Past Surgical History:  Procedure Laterality Date  . IR ANGIO INTRA EXTRACRAN SEL COM CAROTID INNOMINATE UNI L MOD SED  06/29/2020  . IR CT HEAD LTD  06/29/2020  . IR CT HEAD LTD  06/29/2020  . IR INTRAVSC STENT CERV CAROTID W/O EMB-PROT MOD SED INC ANGIO  06/29/2020  . IR PERCUTANEOUS ART THROMBECTOMY/INFUSION INTRACRANIAL INC DIAG ANGIO  06/29/2020  . RADIOLOGY WITH ANESTHESIA N/A 06/29/2020   Procedure: IR WITH ANESTHESIA;  Surgeon: Radiologist, Medication, MD;  Location: [facility] OR;  Service: Radiology;  Laterality: N/A;   Past Medical History:  Diagnosis Date  . Coronary artery disease   . Hypertension    BP (!) 173/82   Pulse (!) 41   Temp 98.5 F (36.9 C)   SpO2 97%   Opioid Risk Score:   Fall Risk Score:  `1  Depression screen PHQ 2/9  Depression screen PHQ 2/9 08/26/2020  Decreased Interest 0  Down, Depressed, Hopeless 0  PHQ - 2 Score 0  Altered sleeping 3  Tired, decreased energy 0  Change in appetite 0  Feeling bad or failure about yourself  0  Trouble concentrating 0  Moving slowly or fidgety/restless 0  Suicidal thoughts 0  PHQ-9 Score 3     Review of Systems     Objective:   Physical Exam Constitutional:      Appearance: Normal appearance. He is normal weight.  HENT:     Head: Normocephalic and atraumatic.  Eyes:     Extraocular Movements:  Extraocular movements intact.     Conjunctiva/sclera: Conjunctivae normal.     Pupils: Pupils are equal, round, and reactive to light.  Cardiovascular:     Rate and Rhythm: Normal rate and regular rhythm.     Pulses: Normal pulses.     Heart sounds: Normal heart sounds. No murmur heard.   Pulmonary:     Effort: Pulmonary effort is normal.     Breath sounds: Normal breath sounds.  Abdominal:     General: Abdomen is flat. Bowel sounds are normal.     Palpations: Abdomen is soft.  Skin:    General: Skin is warm and dry.  Neurological:     Mental Status: He is alert.     Comments: Patient is oriented to person place and time Mood and affect are appropriate Speech without evidence of dysarthria or aphasia. Motor strength is 5/5 right deltoid bicep tricep grip hip flexor,  knee extensor, ankle dorsiflexor and plantar flexor 3 - at the left deltoid 4 at the bicep to minus at the finger flexors and extensors as well as the wrist flexors and extensors on the left side. Hip flexor knee extensor 3 - in the left ankle dorsiflexion Tone MAS 3 at the elbow flexors MAS 3 at the shoulder adductor's MAS 3 at the knee flexors on the left           Assessment & Plan:  #1.  History of right basal ganglia infarct with left spastic hemiparesis overall making good progress with home health.  He still requiring assistance with self-care and mobility. His spasticity is starting to return.  He had good results with the botulinum toxin junction.  Repeat after 12 weeks following the last injection which was performed on Oct 22,/2021 Botox 400U  Left pectoralis 150U Left brachioradialis 50U Left hamstrings 200U   I discussed the patient's medications with patient's wife in the lobby.  She states he is taking all of his medicines.  His elevated blood pressure today was an outlier and generally runs systolic blood pressure of 120 or 130.  He will follow-up with primary care on this. Patient is running low  his atorvastatin, I will refill this, subsequent refills can come from his primary care provider who will see him at the end of the month.

## 2020-10-01 NOTE — Patient Instructions (Signed)
Will do Botox 400 Units next visit to the Left brachioradialis, Left pectoralis, Left Hamstrings

## 2020-10-13 ENCOUNTER — Other Ambulatory Visit: Payer: Self-pay

## 2020-10-13 NOTE — Patient Outreach (Signed)
[facility]) Care Management  10/13/2020  Tyler Richards January 07, 1951 [id number]  Telephone outreach to patient to obtain mRS was successfully completed. MRS= 5  Thank you, [place] Care Management Assistant

## 2020-10-15 ENCOUNTER — Encounter: Payer: Medicare Other | Admitting: Physical Medicine & Rehabilitation

## 2020-10-22 ENCOUNTER — Telehealth: Payer: Self-pay

## 2020-10-22 NOTE — Telephone Encounter (Signed)
Chart reviewed per protocol: Marijean Niemann with [facility] given verbal orders, for Physical Therapy care. Visits to start 10/26/2019, twice a week for 4 weeks, then once a week for 4 weeks.  Call back phone number [phone].

## 2020-10-26 ENCOUNTER — Encounter: Payer: Self-pay | Admitting: Nurse Practitioner

## 2020-10-26 ENCOUNTER — Other Ambulatory Visit: Payer: Self-pay

## 2020-10-26 ENCOUNTER — Ambulatory Visit (INDEPENDENT_AMBULATORY_CARE_PROVIDER_SITE_OTHER): Payer: Medicare Other | Admitting: Nurse Practitioner

## 2020-10-26 VITALS — BP 138/80 | HR 92 | Temp 97.8°F | Wt 156.2 lb

## 2020-10-26 DIAGNOSIS — F19982 Other psychoactive substance use, unspecified with psychoactive substance-induced sleep disorder: Secondary | ICD-10-CM

## 2020-10-26 DIAGNOSIS — G903 Multi-system degeneration of the autonomic nervous system: Secondary | ICD-10-CM | POA: Diagnosis not present

## 2020-10-26 DIAGNOSIS — I63511 Cerebral infarction due to unspecified occlusion or stenosis of right middle cerebral artery: Secondary | ICD-10-CM

## 2020-10-26 DIAGNOSIS — K254 Chronic or unspecified gastric ulcer with hemorrhage: Secondary | ICD-10-CM | POA: Diagnosis not present

## 2020-10-26 MED ORDER — MIRTAZAPINE 7.5 MG PO TABS: 7.5000 mg | ORAL_TABLET | Freq: Every day | ORAL | 5 refills | Status: DC

## 2020-10-26 MED ORDER — PANTOPRAZOLE SODIUM 20 MG PO TBEC: 20.0000 mg | DELAYED_RELEASE_TABLET | Freq: Every day | ORAL | 0 refills | Status: DC

## 2020-10-26 MED ORDER — ATORVASTATIN CALCIUM 40 MG PO TABS: 40.0000 mg | ORAL_TABLET | Freq: Every day | ORAL | 3 refills | Status: DC

## 2020-10-26 MED ORDER — FLUDROCORTISONE ACETATE 0.1 MG PO TABS
0.1500 mg | ORAL_TABLET | Freq: Two times a day (BID) | ORAL | 3 refills | Status: DC
Start: 2020-10-26 — End: 2021-04-19

## 2020-10-26 NOTE — Patient Instructions (Addendum)
Decrease pantoprazole to 20mg  Maintain other medication doses Go to lab for repeat BMP.  Start remeron 1-2tabs at bedtime for sleep.

## 2020-10-26 NOTE — Assessment & Plan Note (Addendum)
Home BP 130s/80s per wife. Reports insomnia since last hospitalization: fragmented sleep (wakes up every 1-2hrs, no improvement with melatonin and benadryl. BP Readings from Last 3 Encounters:  10/26/20 138/80  10/01/20 (!) 173/82  09/24/20 134/80   repeat BMP due to previous hypokalemia I think insomnia is due to florinef Start remeron 15mg  at hs, if no improvement adjust florinef PM dose. F/up in 28months

## 2020-10-26 NOTE — Progress Notes (Signed)
Subjective:  Patient ID: Tyler Richards, male    DOB: [birth year]  Age: 70 y.o. MRN: [id number]  CC: Follow-up (Follow up on hypotension and hyperglycemia. )   HPI Accompanied by wife.  Neurogenic orthostatic hypotension (HCC) Home BP 130s/80s per wife. Reports insomnia since last hospitalization: fragmented sleep (wakes up every 1-2hrs, no improvement with melatonin and benadryl. BP Readings from Last 3 Encounters:  10/26/20 138/80  10/01/20 (!) 173/82  09/24/20 134/80   repeat BMP due to previous hypokalemia I think insomnia is due to florinef Start remeron 15mg  at hs, if no improvement adjust florinef PM dose. F/up in 69months   Reviewed past Medical, Social and Family history today.  Outpatient Medications Prior to Visit  Medication Sig Dispense Refill  . acetaminophen (TYLENOL) 325 MG tablet Take 650 mg by mouth every 6 (six) hours as needed for mild pain or headache.    Marland Kitchen aspirin EC 81 MG tablet Take 81 mg by mouth daily as needed for mild pain. Swallow whole.    . dibucaine (NUPERCAINAL) 1 % ointment Apply topically as needed. Apply to affected area 30 g 0  . diclofenac Sodium (VOLTAREN) 1 % GEL Apply 2 g topically 3 (three) times daily. 2 g 0  . dicyclomine (BENTYL) 20 MG tablet Take by mouth.    . docusate sodium (COLACE) 100 MG capsule Take 100 mg by mouth 2 (two) times daily.    Marland Kitchen gabapentin (NEURONTIN) 300 MG capsule Take 1 capsule (300 mg total) by mouth 3 (three) times daily. 90 capsule 0  . midodrine (PROAMATINE) 10 MG tablet Take 1 tablet (10 mg total) by mouth 2 (two) times daily with a meal. 60 tablet 5  . polyethylene glycol (MIRALAX / GLYCOLAX) 17 g packet Take 17 g by mouth daily.    . potassium chloride SA (KLOR-CON) 20 MEQ tablet Take 2 tablets (40 mEq total) by mouth daily. 4 tablet 0  . saccharomyces boulardii (FLORASTOR) 250 MG capsule Take 250 mg by mouth 2 (two) times daily.    Marland Kitchen senna (SENOKOT) 8.6 MG TABS tablet Take 2 tablets (17.2 mg total) by  mouth at bedtime as needed for mild constipation. 120 tablet 0  . witch hazel-glycerin (TUCKS) pad Apply 1 application topically as needed for itching.    Marland Kitchen atorvastatin (LIPITOR) 40 MG tablet Take 1 tablet (40 mg total) by mouth daily. 30 tablet 0  . fludrocortisone (FLORINEF) 0.1 MG tablet Take 1 tablet (0.1 mg total) by mouth 2 (two) times daily. 60 tablet 5  . pantoprazole (PROTONIX) 40 MG tablet Take 1 tablet (40 mg total) by mouth daily. 30 tablet 0   No facility-administered medications prior to visit.    ROS See HPI  Objective:  BP 138/80 (BP Location: Right Arm, Patient Position: Sitting, Cuff Size: Normal)   Pulse 92   Temp 97.8 F (36.6 C) (Temporal)   Wt 156 lb 3.2 oz (70.9 kg)   SpO2 98%   BMI 21.18 kg/m   Physical Exam Vitals reviewed.  Cardiovascular:     Rate and Rhythm: Normal rate and regular rhythm.     Pulses: Normal pulses.     Heart sounds: Normal heart sounds.  Pulmonary:     Effort: Pulmonary effort is normal.     Breath sounds: Normal breath sounds.  Musculoskeletal:     Right lower leg: No edema.     Left lower leg: No edema.  Neurological:     Mental Status: He is alert and  oriented to person, place, and time.  Psychiatric:        Mood and Affect: Mood normal.        Behavior: Behavior normal.    Assessment & Plan:  This visit occurred during the SARS-CoV-2 public health emergency.  Safety protocols were in place, including screening questions prior to the visit, additional usage of staff PPE, and extensive cleaning of exam room while observing appropriate contact time as indicated for disinfecting solutions.   Watt was seen today for follow-up.  Diagnoses and all orders for this visit:  Right middle cerebral artery stroke (HCC) -     atorvastatin (LIPITOR) 40 MG tablet; Take 1 tablet (40 mg total) by mouth daily.  Neurogenic orthostatic hypotension (HCC) -     fludrocortisone (FLORINEF) 0.1 MG tablet; Take 1.5 tablets (0.15 mg total)  by mouth 2 (two) times daily. -     Cancel: Basic metabolic panel -     Basic metabolic panel; Future  Gastric ulcer with hemorrhage, unspecified chronicity -     pantoprazole (PROTONIX) 20 MG tablet; Take 1 tablet (20 mg total) by mouth daily.  Drug-induced insomnia (HCC) -     mirtazapine (REMERON) 7.5 MG tablet; Take 1 tablet (7.5 mg total) by mouth at bedtime.    Problem List Items Addressed This Visit      Cardiovascular and Mediastinum   Gastric ulcer with hemorrhage   Relevant Medications   pantoprazole (PROTONIX) 20 MG tablet   atorvastatin (LIPITOR) 40 MG tablet   Neurogenic orthostatic hypotension (HCC)    Home BP 130s/80s per wife. Reports insomnia since last hospitalization: fragmented sleep (wakes up every 1-2hrs, no improvement with melatonin and benadryl. BP Readings from Last 3 Encounters:  10/26/20 138/80  10/01/20 (!) 173/82  09/24/20 134/80   repeat BMP due to previous hypokalemia I think insomnia is due to florinef Start remeron 15mg  at hs, if no improvement adjust florinef PM dose. F/up in 13months       Relevant Medications   fludrocortisone (FLORINEF) 0.1 MG tablet   atorvastatin (LIPITOR) 40 MG tablet   Other Relevant Orders   Basic metabolic panel   Right middle cerebral artery stroke (HCC) - Primary   Relevant Medications   atorvastatin (LIPITOR) 40 MG tablet    Other Visit Diagnoses    Drug-induced insomnia (HCC)       Relevant Medications   mirtazapine (REMERON) 7.5 MG tablet      Follow-up: Return in about 3 months (around 01/23/2021) for hyperglycemia, HTN and insomina (2mins).  Wilfred Lacy, NP

## 2020-10-27 ENCOUNTER — Encounter: Payer: Medicare Other | Attending: Physical Medicine & Rehabilitation | Admitting: Physical Medicine & Rehabilitation

## 2020-10-27 ENCOUNTER — Encounter: Payer: Self-pay | Admitting: Physical Medicine & Rehabilitation

## 2020-10-27 VITALS — BP 140/89 | HR 100 | Temp 98.4°F

## 2020-10-27 DIAGNOSIS — I69354 Hemiplegia and hemiparesis following cerebral infarction affecting left non-dominant side: Secondary | ICD-10-CM | POA: Insufficient documentation

## 2020-10-27 DIAGNOSIS — G8110 Spastic hemiplegia affecting unspecified side: Secondary | ICD-10-CM | POA: Diagnosis present

## 2020-10-27 NOTE — Patient Instructions (Signed)

## 2020-10-27 NOTE — Progress Notes (Signed)
Botox Injection for spasticity using needle EMG guidance  Dilution: 50 Units/ml Indication: Severe spasticity which interferes with ADL,mobility and/or  hygiene and is unresponsive to medication management and other conservative care Informed consent was obtained after describing risks and benefits of the procedure with the patient. This includes bleeding, bruising, infection, excessive weakness, or medication side effects. A REMS form is on file and signed. Needle: 27g 1" needle electrode Number of units per muscle Left pectoralis 50U Left Biceps 100U Left brachioradialis 50U Left hamstrings 200U  All injections were done after obtaining appropriate EMG activity and after negative drawback for blood. The patient tolerated the procedure well. Post procedure instructions were given. A followup appointment was made.

## 2020-11-13 ENCOUNTER — Telehealth: Payer: Self-pay | Admitting: Nurse Practitioner

## 2020-11-13 DIAGNOSIS — G8110 Spastic hemiplegia affecting unspecified side: Secondary | ICD-10-CM

## 2020-11-13 DIAGNOSIS — I10 Essential (primary) hypertension: Secondary | ICD-10-CM | POA: Insufficient documentation

## 2020-11-13 DIAGNOSIS — G8114 Spastic hemiplegia affecting left nondominant side: Secondary | ICD-10-CM | POA: Insufficient documentation

## 2020-11-13 DIAGNOSIS — K921 Melena: Secondary | ICD-10-CM | POA: Insufficient documentation

## 2020-11-13 DIAGNOSIS — G9340 Encephalopathy, unspecified: Secondary | ICD-10-CM

## 2020-11-13 NOTE — Telephone Encounter (Signed)
Please advise to go to urgent clinic

## 2020-11-13 NOTE — Telephone Encounter (Signed)
Pt wife informed to take patient to urgent care via voicemail.

## 2020-11-13 NOTE — Telephone Encounter (Signed)
Patients wife is calling to speak to the nurse regarding patients symptoms. States he's having frequent bowel movements every 30 minutes, but it's not diarrhea and he's weak. There are no appointments available with any of the providers. Please call patient at [phone] and advise.

## 2020-11-16 DIAGNOSIS — I455 Other specified heart block: Secondary | ICD-10-CM | POA: Insufficient documentation

## 2020-11-16 DIAGNOSIS — R001 Bradycardia, unspecified: Secondary | ICD-10-CM

## 2020-11-16 DIAGNOSIS — I495 Sick sinus syndrome: Secondary | ICD-10-CM | POA: Insufficient documentation

## 2020-11-16 DIAGNOSIS — I6529 Occlusion and stenosis of unspecified carotid artery: Secondary | ICD-10-CM | POA: Insufficient documentation

## 2020-11-26 ENCOUNTER — Telehealth: Payer: Self-pay | Admitting: *Deleted

## 2020-11-26 NOTE — Telephone Encounter (Signed)
[name] called about getting resumption of care orders for PT OT. Tyler Richards went back into the hospital briefly for a few days for what looks like a loop recorder procedure with [facility].  [facility]  is asking to resume PT OT. Will that be ok?

## 2020-12-01 ENCOUNTER — Other Ambulatory Visit: Payer: Self-pay

## 2020-12-01 ENCOUNTER — Ambulatory Visit (INDEPENDENT_AMBULATORY_CARE_PROVIDER_SITE_OTHER): Payer: Medicare Other

## 2020-12-01 VITALS — BP 102/56 | HR 59 | Temp 97.3°F | Resp 16

## 2020-12-01 DIAGNOSIS — Z0000 Encounter for general adult medical examination without abnormal findings: Secondary | ICD-10-CM | POA: Diagnosis not present

## 2020-12-01 NOTE — Progress Notes (Signed)
Subjective:   Tyler Richards is a 70 y.o. male who presents for an Initial Medicare Annual Wellness Visit.   Review of Systems     Cardiac Risk Factors include: advanced age (>20men, >33 women);hypertension;sedentary lifestyle;male gender     Objective:    Today's Vitals   12/01/20 1444  BP: (!) 102/56  Pulse: (!) 59  Resp: 16  Temp: (!) 97.3 F (36.3 C)  TempSrc: Temporal  SpO2: 98%   There is no height or weight on file to calculate BMI.  Advanced Directives 12/01/2020 07/09/2020 06/29/2020  Does Patient Have a Medical Advance Directive? No No No  Would patient like information on creating a medical advance directive? No - Patient declined Yes (Inpatient - patient requests chaplain consult to create a medical advance directive) No - Patient declined    Current Medications (verified) Outpatient Encounter Medications as of 12/01/2020  Medication Sig  . amoxicillin-clavulanate (AUGMENTIN) 875-125 MG tablet Take by mouth.  Marland Kitchen atorvastatin (LIPITOR) 40 MG tablet Take 1 tablet (40 mg total) by mouth daily.  . dibucaine (NUPERCAINAL) 1 % ointment Apply topically as needed. Apply to affected area  . diclofenac Sodium (VOLTAREN) 1 % GEL Apply 2 g topically 3 (three) times daily.  Marland Kitchen docusate sodium (COLACE) 100 MG capsule Take 100 mg by mouth 2 (two) times daily.  . fludrocortisone (FLORINEF) 0.1 MG tablet Take 1.5 tablets (0.15 mg total) by mouth 2 (two) times daily.  Marland Kitchen gabapentin (NEURONTIN) 300 MG capsule Take 1 capsule (300 mg total) by mouth 3 (three) times daily.  . metroNIDAZOLE (FLAGYL) 500 MG tablet Take by mouth.  . midodrine (PROAMATINE) 10 MG tablet Take 1 tablet (10 mg total) by mouth 2 (two) times daily with a meal.  . mirtazapine (REMERON) 7.5 MG tablet Take 1 tablet (7.5 mg total) by mouth at bedtime.  . pantoprazole (PROTONIX) 20 MG tablet Take 1 tablet (20 mg total) by mouth daily.  . pantoprazole (PROTONIX) 40 MG tablet Take 40 mg by mouth 2 (two) times daily.  .  polyethylene glycol (MIRALAX / GLYCOLAX) 17 g packet Take 17 g by mouth daily.  . potassium chloride SA (KLOR-CON) 20 MEQ tablet Take 2 tablets (40 mEq total) by mouth daily.  Marland Kitchen senna (SENOKOT) 8.6 MG TABS tablet Take 2 tablets (17.2 mg total) by mouth at bedtime as needed for mild constipation.  Marland Kitchen witch hazel-glycerin (TUCKS) pad Apply 1 application topically as needed for itching.  Marland Kitchen acetaminophen (TYLENOL) 325 MG tablet Take 650 mg by mouth every 6 (six) hours as needed for mild pain or headache. (Patient not taking: Reported on 12/01/2020)  . aspirin EC 81 MG tablet Take 81 mg by mouth daily as needed for mild pain. Swallow whole. (Patient not taking: Reported on 12/01/2020)  . dicyclomine (BENTYL) 20 MG tablet Take by mouth. (Patient not taking: Reported on 12/01/2020)  . saccharomyces boulardii (FLORASTOR) 250 MG capsule Take 250 mg by mouth 2 (two) times daily. (Patient not taking: Reported on 12/01/2020)   No facility-administered encounter medications on file as of 12/01/2020.    Allergies (verified) Patient has no known allergies.   History: Past Medical History:  Diagnosis Date  . Coronary artery disease   . Hypertension    Past Surgical History:  Procedure Laterality Date  . IR ANGIO INTRA EXTRACRAN SEL COM CAROTID INNOMINATE UNI L MOD SED  06/29/2020  . IR CT HEAD LTD  06/29/2020  . IR CT HEAD LTD  06/29/2020  . IR INTRAVSC  STENT CERV CAROTID W/O EMB-PROT MOD SED INC ANGIO  06/29/2020  . IR PERCUTANEOUS ART THROMBECTOMY/INFUSION INTRACRANIAL INC DIAG ANGIO  06/29/2020  . RADIOLOGY WITH ANESTHESIA N/A 06/29/2020   Procedure: IR WITH ANESTHESIA;  Surgeon: Radiologist, Medication, MD;  Location: [facility];  Service: Radiology;  Laterality: N/A;   History reviewed. No pertinent family history. Social History   Socioeconomic History  . Marital status: Married    Spouse name: Not on file  . Number of children: Not on file  . Years of education: Not on file  . Highest education level: Not on  file  Occupational History  . Not on file  Tobacco Use  . Smoking status: Never Smoker  . Smokeless tobacco: Never Used  Vaping Use  . Vaping Use: Never used  Substance and Sexual Activity  . Alcohol use: Not Currently  . Drug use: Never  . Sexual activity: Not on file  Other Topics Concern  . Not on file  Social History Narrative  . Not on file   Social Determinants of Health   Financial Resource Strain: Low Risk   . Difficulty of Paying Living Expenses: Not hard at all  Food Insecurity: No Food Insecurity  . Worried About Charity fundraiser in the Last Year: Never true  . Ran Out of Food in the Last Year: Never true  Transportation Needs: No Transportation Needs  . Lack of Transportation (Medical): No  . Lack of Transportation (Non-Medical): No  Physical Activity: Inactive  . Days of Exercise per Week: 0 days  . Minutes of Exercise per Session: 0 min  Stress: No Stress Concern Present  . Feeling of Stress : Not at all  Social Connections: Moderately Isolated  . Frequency of Communication with Friends and Family: More than three times a week  . Frequency of Social Gatherings with Friends and Family: More than three times a week  . Attends Religious Services: Never  . Active Member of Clubs or Organizations: No  . Attends Archivist Meetings: Never  . Marital Status: Married    Tobacco Counseling Counseling given: Not Answered   Clinical Intake:  Pre-visit preparation completed: Yes  Pain : No/denies pain     Nutritional Risks: None Diabetes: No  How often do you need to have someone help you when you read instructions, pamphlets, or other written materials from your doctor or pharmacy?: 1 - Never  Diabetic?No  Interpreter Needed?: No  Information entered by :: Caroleen Hamman LPN   Activities of Daily Living In your present state of health, do you have any difficulty performing the following activities: 12/01/2020 07/09/2020  Hearing? N N   Vision? N N  Difficulty concentrating or making decisions? N N  Walking or climbing stairs? Y Y  Dressing or bathing? Y Y  Doing errands, shopping? Y N  Preparing Food and eating ? Y -  Using the Toilet? Y -  Comment Has a Catheter in place -  In the past six months, have you accidently leaked urine? N -  Do you have problems with loss of bowel control? N -  Managing your Medications? N -  Housekeeping or managing your Housekeeping? Y -    Patient Care Team: Nche, Charlene Brooke, NP as PCP - General (Internal Medicine)  Indicate any recent Medical Services you may have received from other than [facility] providers in the past year (date may be approximate).     Assessment:   This is a routine wellness examination  for [place].  Hearing/Vision screen No exam data present  Dietary issues and exercise activities discussed: Current Exercise Habits: The patient does not participate in regular exercise at present, Exercise limited by: neurologic condition(s) (stroke)  Goals    . Patient Stated     Continue physical therapy to improve ambulation      Depression Screen PHQ 2/9 Scores 12/01/2020 08/26/2020  PHQ - 2 Score 0 0  PHQ- 9 Score - 3    Fall Risk Fall Risk  12/01/2020 08/26/2020  Falls in the past year? 0 0  Number falls in past yr: 0 0  Injury with Fall? 0 0  Risk for fall due to : Impaired balance/gait -  Follow up Falls prevention discussed -    FALL RISK PREVENTION PERTAINING TO THE HOME:  Any stairs in or around the home? Yes  If so, are there any without handrails? No  Home free of loose throw rugs in walkways, pet beds, electrical cords, etc? Yes  Adequate lighting in your home to reduce risk of falls? Yes   ASSISTIVE DEVICES UTILIZED TO PREVENT FALLS:  Life alert? No  Use of a cane, walker or w/c? Yes  Grab bars in the bathroom? Yes  Shower chair or bench in shower? No  Elevated toilet seat or a handicapped toilet? No   TIMED UP AND GO:  Was the test  performed? No . Patient is a wheelchair today   Cognitive Function:Normal cognitive status assessed by direct observation by this Nurse Health Advisor. No abnormalities found.          Immunizations Immunization History  Administered Date(s) Administered  . Fluad Quad(high Dose 65+) 07/09/2020  . Moderna Sars-Covid-2 Vaccination 01/03/2020, 01/31/2020  . Pneumococcal Polysaccharide-23 07/09/2020  . Zoster 01/09/2013    TDAP status: Due, Education has been provided regarding the importance of this vaccine. Advised may receive this vaccine at local pharmacy or Health Dept. Aware to provide a copy of the vaccination record if obtained from local pharmacy or Health Dept. Verbalized acceptance and understanding.  Flu Vaccine status: Up to date  Pneumococcal vaccine status: Up to date  Covid-19 vaccine status: Information provided on how to obtain vaccines. Booster due  Qualifies for Shingles Vaccine? Yes   Zostavax completed Yes   Shingrix Completed?: No.    Education has been provided regarding the importance of this vaccine. Patient has been advised to call insurance company to determine out of pocket expense if they have not yet received this vaccine. Advised may also receive vaccine at local pharmacy or Health Dept. Verbalized acceptance and understanding.  Screening Tests Health Maintenance  Topic Date Due  . TETANUS/TDAP  Never done  . COLONOSCOPY (Pts 45-7yrs Insurance coverage will need to be confirmed)  Never done  . COVID-19 Vaccine (3 - Booster for Moderna series) 08/02/2020  . PNA vac Low Risk Adult (2 of 2 - PCV13) 07/09/2021  . INFLUENZA VACCINE  Completed  . Hepatitis C Screening  Completed  . HPV VACCINES  Aged Out    Health Maintenance  Health Maintenance Due  Topic Date Due  . TETANUS/TDAP  Never done  . COLONOSCOPY (Pts 45-34yrs Insurance coverage will need to be confirmed)  Never done  . COVID-19 Vaccine (3 - Booster for Moderna series) 08/02/2020     Colorectal cancer screening: Patient is currently seeing a GI doctor & is in the process of scheduling a colonoscopy.  Lung Cancer Screening: (Low Dose CT Chest recommended if Age 23-80 years, 3  pack-year currently smoking OR have quit w/in 15years.) does not qualify.     Additional Screening:  Hepatitis C Screening: Completed 07/08/2020  Vision Screening: Recommended annual ophthalmology exams for early detection of glaucoma and other disorders of the eye. Is the patient up to date with their annual eye exam?  No  Who is the provider or what is the name of the office in which the patient attends annual eye exams? unsure If pt is not established with a provider, would they like to be referred to a provider to establish care? No .   Dental Screening: Recommended annual dental exams for proper oral hygiene  Community Resource Referral / Chronic Care Management: CRR required this visit?  No   CCM required this visit?  No      Plan:     I have personally reviewed and noted the following in the patient's chart:   . Medical and social history . Use of alcohol, tobacco or illicit drugs  . Current medications and supplements . Functional ability and status . Nutritional status . Physical activity . Advanced directives . List of other physicians . Hospitalizations, surgeries, and ER visits in previous 12 months . Vitals . Screenings to include cognitive, depression, and falls . Referrals and appointments  In addition, I have reviewed and discussed with patient certain preventive protocols, quality metrics, and best practice recommendations. A written personalized care plan for preventive services as well as general preventive health recommendations were provided to patient.     Marta Antu, LPN   04/26/8589  Nurse Health Advisor  Nurse Notes: Patient was in the hospital in [place] 2-3 weeks ago. Appt made with PCP to discuss recent hospital stay.

## 2020-12-01 NOTE — Patient Instructions (Signed)
[name] , Thank you for taking time to come for your Medicare Wellness Visit. I appreciate your ongoing commitment to your health goals. Please review the following plan we discussed and let me know if I can assist you in the future.   Screening recommendations/referrals: Colonoscopy: Due- Per our conversation, seeing GI & he will order. Recommended yearly ophthalmology/optometry visit for glaucoma screening and checkup Recommended yearly dental visit for hygiene and checkup  Vaccinations: Influenza vaccine: Up to date Pneumococcal vaccine: Up to date Tdap vaccine: Discuss with pharmacy Shingles vaccine: Discuss with pharmacy  Covid-19: Booster due  Advanced directives: Declined information today  Conditions/risks identified: See problem list  Next appointment: Follow up in one year for your annual wellness visit.   Preventive Care 34 Years and Older, Male Preventive care refers to lifestyle choices and visits with your health care provider that can promote health and wellness. What does preventive care include?  A yearly physical exam. This is also called an annual well check.  Dental exams once or twice a year.  Routine eye exams. Ask your health care provider how often you should have your eyes checked.  Personal lifestyle choices, including:  Daily care of your teeth and gums.  Regular physical activity.  Eating a healthy diet.  Avoiding tobacco and drug use.  Limiting alcohol use.  Practicing safe sex.  Taking low doses of aspirin every day.  Taking vitamin and mineral supplements as recommended by your health care provider. What happens during an annual well check? The services and screenings done by your health care provider during your annual well check will depend on your age, overall health, lifestyle risk factors, and family history of disease. Counseling  Your health care provider may ask you questions about your:  Alcohol use.  Tobacco use.  Drug  use.  Emotional well-being.  Home and relationship well-being.  Sexual activity.  Eating habits.  History of falls.  Memory and ability to understand (cognition).  Work and work Statistician. Screening  You may have the following tests or measurements:  Height, weight, and BMI.  Blood pressure.  Lipid and cholesterol levels. These may be checked every 5 years, or more frequently if you are over 67 years old.  Skin check.  Lung cancer screening. You may have this screening every year starting at age 48 if you have a 30-pack-year history of smoking and currently smoke or have quit within the past 15 years.  Fecal occult blood test (FOBT) of the stool. You may have this test every year starting at age 59.  Flexible sigmoidoscopy or colonoscopy. You may have a sigmoidoscopy every 5 years or a colonoscopy every 10 years starting at age 56.  Prostate cancer screening. Recommendations will vary depending on your family history and other risks.  Hepatitis C blood test.  Hepatitis B blood test.  Sexually transmitted disease (STD) testing.  Diabetes screening. This is done by checking your blood sugar (glucose) after you have not eaten for a while (fasting). You may have this done every 1-3 years.  Abdominal aortic aneurysm (AAA) screening. You may need this if you are a current or former smoker.  Osteoporosis. You may be screened starting at age 29 if you are at high risk. Talk with your health care provider about your test results, treatment options, and if necessary, the need for more tests. Vaccines  Your health care provider may recommend certain vaccines, such as:  Influenza vaccine. This is recommended every year.  Tetanus, diphtheria,  and acellular pertussis (Tdap, Td) vaccine. You may need a Td booster every 10 years.  Zoster vaccine. You may need this after age 81.  Pneumococcal 13-valent conjugate (PCV13) vaccine. One dose is recommended after age  29.  Pneumococcal polysaccharide (PPSV23) vaccine. One dose is recommended after age 35. Talk to your health care provider about which screenings and vaccines you need and how often you need them. This information is not intended to replace advice given to you by your health care provider. Make sure you discuss any questions you have with your health care provider. Document Released: 10/09/2015 Document Revised: 06/01/2016 Document Reviewed: 07/14/2015 Elsevier Interactive Patient Education  2017 [employer] Prevention in the Home Falls can cause injuries. They can happen to people of all ages. There are many things you can do to make your home safe and to help prevent falls. What can I do on the outside of my home?  Regularly fix the edges of walkways and driveways and fix any cracks.  Remove anything that might make you trip as you walk through a door, such as a raised step or threshold.  Trim any bushes or trees on the path to your home.  Use bright outdoor lighting.  Clear any walking paths of anything that might make someone trip, such as rocks or tools.  Regularly check to see if handrails are loose or broken. Make sure that both sides of any steps have handrails.  Any raised decks and porches should have guardrails on the edges.  Have any leaves, snow, or ice cleared regularly.  Use sand or salt on walking paths during winter.  Clean up any spills in your garage right away. This includes oil or grease spills. What can I do in the bathroom?  Use night lights.  Install grab bars by the toilet and in the tub and shower. Do not use towel bars as grab bars.  Use non-skid mats or decals in the tub or shower.  If you need to sit down in the shower, use a plastic, non-slip stool.  Keep the floor dry. Clean up any water that spills on the floor as soon as it happens.  Remove soap buildup in the tub or shower regularly.  Attach bath mats securely with double-sided  non-slip rug tape.  Do not have throw rugs and other things on the floor that can make you trip. What can I do in the bedroom?  Use night lights.  Make sure that you have a light by your bed that is easy to reach.  Do not use any sheets or blankets that are too big for your bed. They should not hang down onto the floor.  Have a firm chair that has side arms. You can use this for support while you get dressed.  Do not have throw rugs and other things on the floor that can make you trip. What can I do in the kitchen?  Clean up any spills right away.  Avoid walking on wet floors.  Keep items that you use a lot in easy-to-reach places.  If you need to reach something above you, use a strong step stool that has a grab bar.  Keep electrical cords out of the way.  Do not use floor polish or wax that makes floors slippery. If you must use wax, use non-skid floor wax.  Do not have throw rugs and other things on the floor that can make you trip. What can I do with my  stairs?  Do not leave any items on the stairs.  Make sure that there are handrails on both sides of the stairs and use them. Fix handrails that are broken or loose. Make sure that handrails are as long as the stairways.  Check any carpeting to make sure that it is firmly attached to the stairs. Fix any carpet that is loose or worn.  Avoid having throw rugs at the top or bottom of the stairs. If you do have throw rugs, attach them to the floor with carpet tape.  Make sure that you have a light switch at the top of the stairs and the bottom of the stairs. If you do not have them, ask someone to add them for you. What else can I do to help prevent falls?  Wear shoes that:  Do not have high heels.  Have rubber bottoms.  Are comfortable and fit you well.  Are closed at the toe. Do not wear sandals.  If you use a stepladder:  Make sure that it is fully opened. Do not climb a closed stepladder.  Make sure that both  sides of the stepladder are locked into place.  Ask someone to hold it for you, if possible.  Clearly mark and make sure that you can see:  Any grab bars or handrails.  First and last steps.  Where the edge of each step is.  Use tools that help you move around (mobility aids) if they are needed. These include:  Canes.  Walkers.  Scooters.  Crutches.  Turn on the lights when you go into a dark area. Replace any light bulbs as soon as they burn out.  Set up your furniture so you have a clear path. Avoid moving your furniture around.  If any of your floors are uneven, fix them.  If there are any pets around you, be aware of where they are.  Review your medicines with your doctor. Some medicines can make you feel dizzy. This can increase your chance of falling. Ask your doctor what other things that you can do to help prevent falls. This information is not intended to replace advice given to you by your health care provider. Make sure you discuss any questions you have with your health care provider. Document Released: 07/09/2009 Document Revised: 02/18/2016 Document Reviewed: 10/17/2014 Elsevier Interactive Patient Education  2017 [employer].

## 2020-12-01 NOTE — Telephone Encounter (Signed)
May resume home health PT OT

## 2020-12-01 NOTE — Telephone Encounter (Signed)
[facility] again for resumption of care orders. Please advise.  Call back ph [phone].  Thank you

## 2020-12-01 NOTE — Telephone Encounter (Signed)
Their office is closed, will notify tomorrow.

## 2020-12-02 NOTE — Telephone Encounter (Signed)
I tried multiple times to reach [facility]-- I have left the message with the answering service (the phones are still rolling to the answering service) that they may resume PT/OT services.

## 2020-12-08 ENCOUNTER — Encounter: Payer: Self-pay | Admitting: Physical Medicine & Rehabilitation

## 2020-12-08 ENCOUNTER — Encounter: Payer: Medicare Other | Attending: Physical Medicine & Rehabilitation | Admitting: Physical Medicine & Rehabilitation

## 2020-12-08 ENCOUNTER — Other Ambulatory Visit: Payer: Self-pay

## 2020-12-08 ENCOUNTER — Telehealth: Payer: Self-pay

## 2020-12-08 VITALS — BP 137/77 | HR 50 | Temp 98.2°F

## 2020-12-08 DIAGNOSIS — G8110 Spastic hemiplegia affecting unspecified side: Secondary | ICD-10-CM | POA: Insufficient documentation

## 2020-12-08 DIAGNOSIS — I63511 Cerebral infarction due to unspecified occlusion or stenosis of right middle cerebral artery: Secondary | ICD-10-CM | POA: Diagnosis not present

## 2020-12-08 NOTE — Telephone Encounter (Signed)
Mark, PT from [facility] called requesting VO for 2wk2, 1wk1 to resume PT.

## 2020-12-08 NOTE — Patient Instructions (Signed)
UROLOGIST WILL NEED TO REMOVE FOLEY.  THE UROLOGIST CAN DETERMINE WHAT IS CAUSING THE PROBLEM WITH URINATION AND BLADDER EMPTYING. IF THE CATHETER IS REMOVED BEFORE THE UROLOGY OFFICE VISIT THERE IS A HIGH LIKELIHOOD THAT IT WILL NEED TO BE REINSERTED

## 2020-12-08 NOTE — Progress Notes (Signed)
Subjective:     Patient ID: Tyler Richards, male   DOB: 03/20/1951, 70 y.o.   MRN: [id number] 70 y.o. right-handed male with unremarkable past medical history no prescription medications.  Per chart review lives with spouse independent prior to admission and active.  1 level home 2 steps to entry.  Presented 06/29/2020 with acute onset of left-sided weakness and slurred speech.  Cranial CT scan showed hyperdense distal right ICA and proximal MCA.  Blunted appearance of the posterior right putamen.  Chronic right high frontal cortex infarct.  Patient did not receive TPA.  CT angiogram of head and neck emergent large vessel occlusion with no flow seen in the right internal carotid artery or proximal MCA.  Patient underwent right MCA thrombectomy right ICA stent placement 06/29/2020 per interventional radiology.  Most recent MRI and imaging revealed acute infarct right basal ganglia with nonprogressive hemorrhage when correlated with a prior CT of 06/29/2020.  Lower extremity Dopplers no DVT.  Carotid Dopplers no ICA stenosis.  Echocardiogram with ejection fraction of 50 to 79% grade 1 diastolic dysfunction.  Admission chemistries unremarkable aside from glucose 104 urine drug screen negative.  Patient did receive cardiology consult for bradycardia consistent with hyper vagotonia and maintained on ProAmatine.  EKG normal sinus rhythm 70s to 80s occasional sinus bradycardia.  Patient initially maintained on aspirin as well as Brilinta.  Extubated 06/30/2020.  Urine culture greater 100,000 Enterobacter placed on Maxipime changed to Bactrim.  Gastroenterology services consulted due to some bright red blood with bowel movement currently holding off on any endoscopic evaluation monitoring hemoglobin hematocrit with latest hemoglobin 9.8.  His Brilinta had been placed on hold after rectal outlet bleeding continued on low-dose aspirin at the recommendations of neurology services HPI Chief complaint does not like  Foley 70 year old male with history of large right MCA distribution infarct who had a complicated hospital course including orthostatic hypotension, GI bleed, UTI, respiratory failure.  He had a 34-day inpatient length of stay on the rehab unit alone. Interval history the patient has been hospitalized for GI bleed at [facility] Asking to have foley removed, place for retention, has appt with Urology office in [place] next week  Starting up with PT this week, this was put on hold after his last hospitalization The patient continues to require assistance for self-care and mobility.  Comes to clinic in wheelchair. Pain Inventory Average Pain 2 Pain Right Now 2 My pain is burning and aching  In the last 24 hours, has pain interfered with the following? General activity 0 Relation with others 0 Enjoyment of life 0 What TIME of day is your pain at its worst? varies Sleep (in general) Poor  Pain is worse with: inactivity and some activites Pain improves with: ? Relief from Meds: ?  History reviewed. No pertinent family history. Social History   Socioeconomic History  . Marital status: Married    Spouse name: Not on file  . Number of children: Not on file  . Years of education: Not on file  . Highest education level: Not on file  Occupational History  . Not on file  Tobacco Use  . Smoking status: Never Smoker  . Smokeless tobacco: Never Used  Vaping Use  . Vaping Use: Never used  Substance and Sexual Activity  . Alcohol use: Not Currently  . Drug use: Never  . Sexual activity: Not on file  Other Topics Concern  . Not on file  Social History Narrative  . Not on file  Social Determinants of Health   Financial Resource Strain: Low Risk   . Difficulty of Paying Living Expenses: Not hard at all  Food Insecurity: No Food Insecurity  . Worried About Charity fundraiser in the Last Year: Never true  . Ran Out of Food in the Last Year: Never true  Transportation  Needs: No Transportation Needs  . Lack of Transportation (Medical): No  . Lack of Transportation (Non-Medical): No  Physical Activity: Inactive  . Days of Exercise per Week: 0 days  . Minutes of Exercise per Session: 0 min  Stress: No Stress Concern Present  . Feeling of Stress : Not at all  Social Connections: Moderately Isolated  . Frequency of Communication with Friends and Family: More than three times a week  . Frequency of Social Gatherings with Friends and Family: More than three times a week  . Attends Religious Services: Never  . Active Member of Clubs or Organizations: No  . Attends Archivist Meetings: Never  . Marital Status: Married   Past Surgical History:  Procedure Laterality Date  . IR ANGIO INTRA EXTRACRAN SEL COM CAROTID INNOMINATE UNI L MOD SED  06/29/2020  . IR CT HEAD LTD  06/29/2020  . IR CT HEAD LTD  06/29/2020  . IR INTRAVSC STENT CERV CAROTID W/O EMB-PROT MOD SED INC ANGIO  06/29/2020  . IR PERCUTANEOUS ART THROMBECTOMY/INFUSION INTRACRANIAL INC DIAG ANGIO  06/29/2020  . RADIOLOGY WITH ANESTHESIA N/A 06/29/2020   Procedure: IR WITH ANESTHESIA;  Surgeon: Radiologist, Medication, MD;  Location: [place];  Service: Radiology;  Laterality: N/A;   Past Surgical History:  Procedure Laterality Date  . IR ANGIO INTRA EXTRACRAN SEL COM CAROTID INNOMINATE UNI L MOD SED  06/29/2020  . IR CT HEAD LTD  06/29/2020  . IR CT HEAD LTD  06/29/2020  . IR INTRAVSC STENT CERV CAROTID W/O EMB-PROT MOD SED INC ANGIO  06/29/2020  . IR PERCUTANEOUS ART THROMBECTOMY/INFUSION INTRACRANIAL INC DIAG ANGIO  06/29/2020  . RADIOLOGY WITH ANESTHESIA N/A 06/29/2020   Procedure: IR WITH ANESTHESIA;  Surgeon: Radiologist, Medication, MD;  Location: [place];  Service: Radiology;  Laterality: N/A;   Past Medical History:  Diagnosis Date  . Coronary artery disease   . Hypertension    There were no vitals taken for this visit.  Opioid Risk Score:   Fall Risk Score:  `1  Depression screen PHQ  2/9  Depression screen [facility] 2/9 12/01/2020 08/26/2020  Decreased Interest 0 0  Down, Depressed, Hopeless 0 0  PHQ - 2 Score 0 0  Altered sleeping - 3  Tired, decreased energy - 0  Change in appetite - 0  Feeling bad or failure about yourself  - 0  Trouble concentrating - 0  Moving slowly or fidgety/restless - 0  Suicidal thoughts - 0  PHQ-9 Score - 3    Review of Systems  Constitutional: Negative.   HENT: Negative.   Eyes: Negative.   Respiratory: Negative.   Cardiovascular: Negative.   Gastrointestinal: Negative.   Endocrine: Negative.   Genitourinary: Positive for difficulty urinating.  Musculoskeletal: Positive for gait problem.  Skin: Negative.   Allergic/Immunologic: Negative.   Hematological: Negative.   Psychiatric/Behavioral: Negative.   All other systems reviewed and are negative.      Objective:   Physical Exam Vitals and nursing note reviewed.  Constitutional:      Appearance: Normal appearance.  HENT:     Head: Normocephalic and atraumatic.  Eyes:     Extraocular  Movements: Extraocular movements intact.     Conjunctiva/sclera: Conjunctivae normal.     Pupils: Pupils are equal, round, and reactive to light.  Musculoskeletal:     Cervical back: Normal range of motion.     Comments: Arthritic changes bilateral PIPs and DIPs of all fingers. No evidence of knee effusion bilaterally  Skin:    General: Skin is warm and dry.  Neurological:     Mental Status: He is alert. He is disoriented.     Motor: Weakness and abnormal muscle tone present. No tremor.     Coordination: Coordination abnormal.     Gait: Gait abnormal.     Comments: Tone Right upper extremity and right lower extremity tone are normal Left upper extremity tone MAS 3 at the left pectoralis MAS 3 at the left biceps/brachioradialis MAS 1 at the wrist flexors and finger flexors MAS 1 at the knee flexors There is no ankle clonus  Motor strength is 5/5 in the right deltoid bicep tricep grip hip  flexor knee extensor ankle dorsiflex 4 - at the left knee extensor 3 - at the hip flexor and ankle dorsiflexors to minus at the left deltoid 3 - at the bicep trace tricep to minus finger flexors 0 finger extensors         Assessment:     Left spastic hemiplegia secondary to right MCA distribution infarct.  While he has made good recovery after this large stroke he still has ongoing issues with mobility ADLs and cognition.  His tone is difficult to manage as well and has required botulinum toxin injections Numeral 2.  Urinary retention requiring Foley placement I have encouraged patient to keep this in until he sees the urologist     Plan:     Have ordered home health PT OT SLP  Repeat Botox 400 units in 6 weeks  Left pectoralis 150 units Left biceps 150 units Left hamstring 100 units

## 2020-12-09 ENCOUNTER — Encounter: Payer: Self-pay | Admitting: Nurse Practitioner

## 2020-12-09 ENCOUNTER — Ambulatory Visit (INDEPENDENT_AMBULATORY_CARE_PROVIDER_SITE_OTHER): Payer: Medicare Other | Admitting: Nurse Practitioner

## 2020-12-09 ENCOUNTER — Other Ambulatory Visit: Payer: Self-pay

## 2020-12-09 VITALS — BP 104/60 | HR 60 | Temp 97.0°F | Wt 160.0 lb

## 2020-12-09 DIAGNOSIS — K5900 Constipation, unspecified: Secondary | ICD-10-CM

## 2020-12-09 DIAGNOSIS — E876 Hypokalemia: Secondary | ICD-10-CM

## 2020-12-09 DIAGNOSIS — D500 Iron deficiency anemia secondary to blood loss (chronic): Secondary | ICD-10-CM

## 2020-12-09 DIAGNOSIS — I10 Essential (primary) hypertension: Secondary | ICD-10-CM | POA: Diagnosis not present

## 2020-12-09 LAB — CBC WITH DIFFERENTIAL/PLATELET
Basophils Absolute: 0 10*3/uL (ref 0.0–0.1)
Basophils Relative: 1.2 % (ref 0.0–3.0)
Eosinophils Absolute: 0 10*3/uL (ref 0.0–0.7)
Eosinophils Relative: 0.9 % (ref 0.0–5.0)
HCT: 24.5 % — ABNORMAL LOW (ref 39.0–52.0)
Hemoglobin: 8.1 g/dL — ABNORMAL LOW (ref 13.0–17.0)
Lymphocytes Relative: 30.8 % (ref 12.0–46.0)
Lymphs Abs: 1 10*3/uL (ref 0.7–4.0)
MCHC: 33.3 g/dL (ref 30.0–36.0)
MCV: 85.3 fl (ref 78.0–100.0)
Monocytes Absolute: 0.4 10*3/uL (ref 0.1–1.0)
Monocytes Relative: 10.4 % (ref 3.0–12.0)
Neutro Abs: 1.9 10*3/uL (ref 1.4–7.7)
Neutrophils Relative %: 56.7 % (ref 43.0–77.0)
Platelets: 341 10*3/uL (ref 150.0–400.0)
RBC: 2.87 Mil/uL — ABNORMAL LOW (ref 4.22–5.81)
RDW: 16.1 % — ABNORMAL HIGH (ref 11.5–15.5)
WBC: 3.4 10*3/uL — ABNORMAL LOW (ref 4.0–10.5)

## 2020-12-09 LAB — BASIC METABOLIC PANEL
BUN: 9 mg/dL (ref 6–23)
CO2: 34 mEq/L — ABNORMAL HIGH (ref 19–32)
Calcium: 8.8 mg/dL (ref 8.4–10.5)
Chloride: 100 mEq/L (ref 96–112)
Creatinine, Ser: 0.62 mg/dL (ref 0.40–1.50)
GFR: 96.74 mL/min (ref >60.00)
Glucose, Bld: 108 mg/dL — ABNORMAL HIGH (ref 70–99)
Potassium: 3.4 mEq/L — ABNORMAL LOW (ref 3.5–5.1)
Sodium: 140 mEq/L (ref 135–145)

## 2020-12-09 NOTE — Progress Notes (Signed)
Subjective:  Patient ID: Tyler Richards, male    DOB: 1951/04/02  Age: 70 y.o. MRN: [id number]  CC: Hospitalization Follow-up [facility] f/u due to constipation. Pt states he has been well since leaving the hospital)  HPI Accompanied by wife. Tyler Richards was evaluated at [facility] ED on 11/28/2020 due to rectal pain and urinary retention. He ws also found to be hypokalemic. He was found to be constipated and indwelling foley was inserted. He has a Urology appt on 12/17/20. He  reports resolved constipation with use of miralax. He denies any melena or hematochezia. Reviewed discharge summary, lab results and radiology report  Reviewed past Medical, Social and Family history today.  Outpatient Medications Prior to Visit  Medication Sig Dispense Refill  . acetaminophen (TYLENOL) 325 MG tablet Take 650 mg by mouth every 6 (six) hours as needed for mild pain or headache.    Marland Kitchen atorvastatin (LIPITOR) 40 MG tablet Take 1 tablet (40 mg total) by mouth daily. 90 tablet 3  . dibucaine (NUPERCAINAL) 1 % ointment Apply topically as needed. Apply to affected area 30 g 0  . diclofenac Sodium (VOLTAREN) 1 % GEL Apply 2 g topically 3 (three) times daily. 2 g 0  . dicyclomine (BENTYL) 20 MG tablet Take by mouth.    . docusate sodium (COLACE) 100 MG capsule Take 100 mg by mouth 2 (two) times daily.    . fludrocortisone (FLORINEF) 0.1 MG tablet Take 1.5 tablets (0.15 mg total) by mouth 2 (two) times daily. 252 tablet 3  . gabapentin (NEURONTIN) 300 MG capsule Take 1 capsule (300 mg total) by mouth 3 (three) times daily. 90 capsule 0  . midodrine (PROAMATINE) 10 MG tablet Take 1 tablet (10 mg total) by mouth 2 (two) times daily with a meal. 60 tablet 5  . pantoprazole (PROTONIX) 20 MG tablet Take 1 tablet (20 mg total) by mouth daily. 90 tablet 0  . pantoprazole (PROTONIX) 40 MG tablet Take 40 mg by mouth 2 (two) times daily.    . polyethylene glycol (MIRALAX / GLYCOLAX) 17 g packet Take 17 g by mouth daily.    Marland Kitchen  saccharomyces boulardii (FLORASTOR) 250 MG capsule Take 250 mg by mouth 2 (two) times daily.    Marland Kitchen senna (SENOKOT) 8.6 MG TABS tablet Take 2 tablets (17.2 mg total) by mouth at bedtime as needed for mild constipation. 120 tablet 0  . witch hazel-glycerin (TUCKS) pad Apply 1 application topically as needed for itching.    . mirtazapine (REMERON) 7.5 MG tablet Take 1 tablet (7.5 mg total) by mouth at bedtime. 30 tablet 5  . potassium chloride SA (KLOR-CON) 20 MEQ tablet Take 2 tablets (40 mEq total) by mouth daily. 4 tablet 0   No facility-administered medications prior to visit.    ROS See HPI  Objective:  BP 104/60 (BP Location: Left Arm, Patient Position: Sitting, Cuff Size: Normal)   Pulse 60   Temp (!) 97 F (36.1 C) (Temporal)   Wt 160 lb (72.6 kg)   SpO2 99%   BMI 21.70 kg/m   Physical Exam Cardiovascular:     Rate and Rhythm: Normal rate.     Pulses: Normal pulses.  Pulmonary:     Effort: Pulmonary effort is normal.  Abdominal:     General: There is no distension.     Palpations: Abdomen is soft.     Tenderness: There is no abdominal tenderness.     Comments: foley catheter in place, cloudy yellow urine in collection  bag  Musculoskeletal:     Right lower leg: No edema.     Left lower leg: No edema.  Neurological:     Mental Status: He is alert and oriented to person, place, and time.  Psychiatric:        Mood and Affect: Mood normal.        Behavior: Behavior normal.    Assessment & Plan:  This visit occurred during the SARS-CoV-2 public health emergency.  Safety protocols were in place, including screening questions prior to the visit, additional usage of staff PPE, and extensive cleaning of exam room while observing appropriate contact time as indicated for disinfecting solutions.   Tyler Richards was seen today for hospitalization follow-up.  Diagnoses and all orders for this visit:  Primary hypertension -     Basic metabolic panel  Iron deficiency anemia due to  chronic blood loss -     CBC with Differential/Platelet -     Iron, TIBC and Ferritin Panel -     Ambulatory referral to Hematology -     Ambulatory referral to Gastroenterology  Hypokalemia -     Basic metabolic panel -     potassium chloride SA (KLOR-CON) 20 MEQ tablet; Take 2 tablets (40 mEq total) by mouth daily.  Constipation, unspecified constipation type -     Ambulatory referral to Gastroenterology  Persistent anemia with low iron which means possible GI bleed. Entered referral to hematology and GI. Stable renal function with low potassium Sent potassium supplement  Problem List Items Addressed This Visit      Cardiovascular and Mediastinum   Hypertension - Primary   Relevant Orders   Basic metabolic panel (Completed)     Other   Iron deficiency anemia due to chronic blood loss   Relevant Orders   CBC with Differential/Platelet (Completed)   Iron, TIBC and Ferritin Panel (Completed)   Ambulatory referral to Hematology   Ambulatory referral to Gastroenterology    Other Visit Diagnoses    Hypokalemia       Relevant Medications   potassium chloride SA (KLOR-CON) 20 MEQ tablet   Other Relevant Orders   Basic metabolic panel (Completed)   Constipation, unspecified constipation type       Relevant Orders   Ambulatory referral to Gastroenterology      Follow-up: Return in about 3 months (around 03/11/2021) for DM and HTN, hyperlipidemia (8mins).  Wilfred Lacy, NP

## 2020-12-09 NOTE — Patient Instructions (Signed)
Maintain appt with urology Go to lab for blood draw Maintain adequate oral hydration

## 2020-12-10 LAB — IRON,TIBC AND FERRITIN PANEL
%SAT: 6 % (calc) — ABNORMAL LOW (ref 20–48)
Ferritin: 9 ng/mL — ABNORMAL LOW (ref 24–380)
Iron: 19 ug/dL — ABNORMAL LOW (ref 50–180)
TIBC: 333 mcg/dL (calc) (ref 250–425)

## 2020-12-16 MED ORDER — POTASSIUM CHLORIDE CRYS ER 20 MEQ PO TBCR
40.0000 meq | EXTENDED_RELEASE_TABLET | Freq: Every day | ORAL | 0 refills | Status: DC
Start: 2020-12-16 — End: 2021-04-19

## 2020-12-17 ENCOUNTER — Telehealth: Payer: Self-pay | Admitting: Physician Assistant

## 2020-12-17 ENCOUNTER — Ambulatory Visit: Payer: Medicare Other | Admitting: Adult Health

## 2020-12-17 DIAGNOSIS — G9340 Encephalopathy, unspecified: Secondary | ICD-10-CM | POA: Insufficient documentation

## 2020-12-17 NOTE — Telephone Encounter (Signed)
Received a new hem referral from Wilfred Lacy, NP for IDA. Ms. Heggs has been cld and scheduled to see Murray Hodgkins on 3/25 at 2pm. Pt aware to arrive 20 minutes early

## 2020-12-17 NOTE — Progress Notes (Signed)
[place] [phone]   [phone] [place] NOTE  Patient Care Team: Nche, Charlene Brooke, NP as PCP - General (Internal Medicine)  Hematological/Oncological History 1) 11/13/2020-11/18/2020: Admitted at [facility] for melena/severe constipation/anemia. EGD did not show any signs of active bleeding. 2)12/09/2020: WBC 3.4, Hgb 8.1, MCV 85.3, Plt 341, Iron 19, TIBC 333, saturation 6%, ferritin 9.  3) 12/18/2020: Establish care with Dede Query PA-C  CHIEF COMPLAINTS/PURPOSE OF CONSULTATION:  "Iron deficiency anemia "  HISTORY OF PRESENTING ILLNESS:  Tyler Richards 70 y.o. male with medical history significant for right MCA infarction, hypertension and cornary artery disease.   On review of the previous records, Tyler Richards is noted to have chronic microcytic anemia. Hemoglobin level dropped markedly from 12.0 on 09/24/20 to 8.1 on 12/09/20. Iron panel was ordered that confirmed iron deficiency anemia.   Patient presents today accompanied by his wife. He reports ongoing fatigue that has worsened over the last two weeks. He continues to recover from his stroke with OT/PT twice a week. Patient has a good appetite and supplements with two protein shakes per day. He denies any nausea, vomiting or abdominal pain. He has daily bowel movements but takes senakot, colace and miralax regularly. He denies any hematochezia or melena. He denies any signs of bleeding or easy bruising. He denies any fevers, chills, sweats, chest pain,shortness of breath or cough. He has no other complaints. Rest of the 10 point ROS is below.    MEDICAL HISTORY:  Past Medical History:  Diagnosis Date  . Coronary artery disease   . Hypertension     SURGICAL HISTORY: Past Surgical History:  Procedure Laterality Date  . IR ANGIO INTRA EXTRACRAN SEL COM CAROTID INNOMINATE UNI L MOD SED  06/29/2020  . IR CT HEAD LTD  06/29/2020  . IR CT HEAD LTD  06/29/2020   . IR INTRAVSC STENT CERV CAROTID W/O EMB-PROT MOD SED INC ANGIO  06/29/2020  . IR PERCUTANEOUS ART THROMBECTOMY/INFUSION INTRACRANIAL INC DIAG ANGIO  06/29/2020  . RADIOLOGY WITH ANESTHESIA N/A 06/29/2020   Procedure: IR WITH ANESTHESIA;  Surgeon: Radiologist, Medication, MD;  Location: [place];  Service: Radiology;  Laterality: N/A;    SOCIAL HISTORY: Social History   Socioeconomic History  . Marital status: Married    Spouse name: Not on file  . Number of children: Not on file  . Years of education: Not on file  . Highest education level: Not on file  Occupational History  . Not on file  Tobacco Use  . Smoking status: Never Smoker  . Smokeless tobacco: Never Used  Vaping Use  . Vaping Use: Never used  Substance and Sexual Activity  . Alcohol use: Not Currently  . Drug use: Never  . Sexual activity: Not on file  Other Topics Concern  . Not on file  Social History Narrative  . Not on file   Social Determinants of Health   Financial Resource Strain: Low Risk   . Difficulty of Paying Living Expenses: Not hard at all  Food Insecurity: No Food Insecurity  . Worried About Charity fundraiser in the Last Year: Never true  . Ran Out of Food in the Last Year: Never true  Transportation Needs: No Transportation Needs  . Lack of Transportation (Medical): No  . Lack of Transportation (Non-Medical): No  Physical Activity: Inactive  . Days of Exercise per Week: 0 days  . Minutes of Exercise per Session: 0 min  Stress: No Stress Concern Present  . Feeling of Stress : Not at all  Social Connections: Moderately Isolated  . Frequency of Communication with Friends and Family: More than three times a week  . Frequency of Social Gatherings with Friends and Family: More than three times a week  . Attends Religious Services: Never  . Active Member of Clubs or Organizations: No  . Attends Archivist Meetings: Never  . Marital Status: Married  Human resources officer Violence: Not At  Risk  . Fear of Current or Ex-Partner: No  . Emotionally Abused: No  . Physically Abused: No  . Sexually Abused: No    FAMILY HISTORY: History reviewed. No pertinent family history.  ALLERGIES:  is allergic to remeron [mirtazapine].  MEDICATIONS:  Current Outpatient Medications  Medication Sig Dispense Refill  . acetaminophen (TYLENOL) 325 MG tablet Take 650 mg by mouth every 6 (six) hours as needed for mild pain or headache.    Marland Kitchen atorvastatin (LIPITOR) 40 MG tablet Take 1 tablet (40 mg total) by mouth daily. 90 tablet 3  . dibucaine (NUPERCAINAL) 1 % ointment Apply topically as needed. Apply to affected area 30 g 0  . diclofenac Sodium (VOLTAREN) 1 % GEL Apply 2 g topically 3 (three) times daily. 2 g 0  . dicyclomine (BENTYL) 20 MG tablet Take by mouth.    . docusate sodium (COLACE) 100 MG capsule Take 100 mg by mouth 2 (two) times daily.    . fludrocortisone (FLORINEF) 0.1 MG tablet Take 1.5 tablets (0.15 mg total) by mouth 2 (two) times daily. 252 tablet 3  . gabapentin (NEURONTIN) 300 MG capsule Take 1 capsule (300 mg total) by mouth 3 (three) times daily. 90 capsule 0  . midodrine (PROAMATINE) 10 MG tablet Take 1 tablet (10 mg total) by mouth 2 (two) times daily with a meal. 60 tablet 5  . pantoprazole (PROTONIX) 20 MG tablet Take 1 tablet (20 mg total) by mouth daily. 90 tablet 0  . pantoprazole (PROTONIX) 40 MG tablet Take 40 mg by mouth 2 (two) times daily.    . polyethylene glycol (MIRALAX / GLYCOLAX) 17 g packet Take 17 g by mouth daily.    . potassium chloride SA (KLOR-CON) 20 MEQ tablet Take 2 tablets (40 mEq total) by mouth daily. 8 tablet 0  . saccharomyces boulardii (FLORASTOR) 250 MG capsule Take 250 mg by mouth 2 (two) times daily.    Marland Kitchen senna (SENOKOT) 8.6 MG TABS tablet Take 2 tablets (17.2 mg total) by mouth at bedtime as needed for mild constipation. 120 tablet 0  . witch hazel-glycerin (TUCKS) pad Apply 1 application topically as needed for itching.     No current  facility-administered medications for this visit.    REVIEW OF SYSTEMS:   Constitutional: ( - ) fevers, ( - )  chills , ( - ) night sweats Eyes: ( - ) blurriness of vision, ( - ) double vision, ( - ) watery eyes Ears, nose, mouth, throat, and face: ( - ) mucositis, ( - ) sore throat Respiratory: ( - ) cough, ( - ) dyspnea, ( - ) wheezes Cardiovascular: ( - ) palpitation, ( - ) chest discomfort, ( - ) lower extremity swelling Gastrointestinal:  ( - ) nausea, ( - ) heartburn, ( - ) change in bowel habits Skin: ( - ) abnormal skin rashes Lymphatics: ( - ) new lymphadenopathy, ( - ) easy bruising Neurological: ( - ) numbness, ( - ) tingling, ( - ) new  weaknesses Behavioral/Psych: ( - ) mood change, ( - ) new changes  All other systems were reviewed with the patient and are negative.  PHYSICAL EXAMINATION: ECOG PERFORMANCE STATUS: 1 - Symptomatic but completely ambulatory  Vitals:   12/18/20 1339  BP: 134/67  Pulse: 71  Resp: 19  Temp: 97.7 F (36.5 C)  SpO2: 100%   Filed Weights   12/18/20 1339  Weight: 158 lb 4.8 oz (71.8 kg)    GENERAL: male that is alert and in not acute distress.  SKIN: skin color, texture, turgor are normal, no rashes or significant lesions EYES: conjunctival pallor, sclera clear OROPHARYNX: no exudate, no erythema; lips, buccal mucosa, and tongue normal  NECK: supple, non-tender LYMPH:  no palpable lymphadenopathy in the cervical, axillary or supraclavicular lymph nodes.  LUNGS: clear to auscultation and percussion with normal breathing effort HEART: regular rate & rhythm and no murmurs and no lower extremity edema ABDOMEN: soft, non-tender, non-distended, normal bowel sounds Musculoskeletal: no cyanosis of digits and no clubbing  PSYCH: alert & oriented x 3, fluent speech   LABORATORY DATA:  I have reviewed the data as listed CBC Latest Ref Rng & Units 12/18/2020 12/09/2020 09/24/2020  WBC 4.0 - 10.5 K/uL 4.0 3.4(L) 4.1  Hemoglobin 13.0 - 17.0 g/dL  7.7(L) 8.1 Repeated and verified X2.(L) 12.0(L)  Hematocrit 39.0 - 52.0 % 24.7(L) 24.5 Repeated and verified X2.(L) 34.6(L)  Platelets 150 - 400 K/uL 371 341.0 268    CMP Latest Ref Rng & Units 12/18/2020 12/09/2020 09/24/2020  Glucose 70 - 99 mg/dL 104(H) 108(H) 97  BUN 8 - 23 mg/dL 12 9 10   Creatinine 0.61 - 1.24 mg/dL 0.68 0.62 0.61(L)  Sodium 135 - 145 mmol/L 140 140 142  Potassium 3.5 - 5.1 mmol/L 2.9(L) 3.4(L) 3.2(L)  Chloride 98 - 111 mmol/L 98 100 103  CO2 22 - 32 mmol/L 32 34(H) 30  Calcium 8.9 - 10.3 mg/dL 8.7(L) 8.8 9.1  Total Protein 6.5 - 8.1 g/dL 6.3(L) - 6.0(L)  Total Bilirubin 0.3 - 1.2 mg/dL 0.4 - 0.4  Alkaline Phos 38 - 126 U/L 62 - -  AST 15 - 41 U/L 17 - 16  ALT 0 - 44 U/L 14 - 8(L)   ASSESSMENT & PLAN Tyler Richards is a 70 y.o. male presenting to the clinic for evaluation for iron deficiency anemia. Patient is accompanied by his wife for this visit. Patient is not taking any oral iron at this time. The plan is to obtain repeat labs to check CBC, CMP, iron and TIBC, ferritin, retic panel, erythropoietin, B12 and folate levels.  If hemoglobin is stable, we will proceed with IV feraheme x 2 doses. If hemoglobin is below 8 today, we will plan to administer 1 unit of pRBC and then proceed with iron replacement.  Patient underwent EGD in February 2022 (after presenting with melena and anemia) that did not reveal any active bleeding. He is scheduled for a colonoscopy on 01/11/2021.   #Iron deficiency anemia: --Repeat labs including CBC, CMP, iron and TIBC, ferritin, retic panel, erythropoietin, B12 and folate levels. --Based on hemoglobin level today, plan to give 1 unit of pRBC versus IV feraheme x 2 doses. -- Need to rule out GI bleed. EGD from February 2022 did not reveal any active bleeding. Patient is currently scheduled for colonoscopy on 01/11/21 with Dr. Zeb Comfort in [place], [place]. Recommend to expedite colonoscopy due to worsening anemia.  --RTC in 2-3 weeks after  IV iron infusion with repeat labs.  Orders Placed This Encounter  Procedures  . CBC with Differential (Cancer Center Only)    Standing Status:   Future    Number of Occurrences:   1    Standing Expiration Date:   12/17/2021  . Retic Panel    Standing Status:   Future    Number of Occurrences:   1    Standing Expiration Date:   12/17/2021  . Folate, Serum    Standing Status:   Future    Number of Occurrences:   1    Standing Expiration Date:   12/17/2021  . Vitamin B12    Standing Status:   Future    Number of Occurrences:   1    Standing Expiration Date:   12/17/2021  . Iron and TIBC    Standing Status:   Future    Number of Occurrences:   1    Standing Expiration Date:   12/18/2021  . Ferritin    Standing Status:   Future    Number of Occurrences:   1    Standing Expiration Date:   12/18/2021  . CMP ([place] only)    Standing Status:   Future    Number of Occurrences:   1    Standing Expiration Date:   12/18/2021  . Erythropoietin    Standing Status:   Future    Number of Occurrences:   1    Standing Expiration Date:   12/18/2021    All questions were answered. The patient knows to call the clinic with any problems, questions or concerns.  A total of 60 minutes were spent on this encounter and over half of that time was spent on counseling and coordination of care as outlined above.    Dede Query, PA-C Department of Hematology/Oncology [place] at [facility] Phone: [phone]

## 2020-12-18 ENCOUNTER — Ambulatory Visit: Payer: Medicare Other

## 2020-12-18 ENCOUNTER — Inpatient Hospital Stay (HOSPITAL_BASED_OUTPATIENT_CLINIC_OR_DEPARTMENT_OTHER): Payer: Medicare Other | Admitting: Physician Assistant

## 2020-12-18 ENCOUNTER — Telehealth: Payer: Self-pay | Admitting: Nurse Practitioner

## 2020-12-18 ENCOUNTER — Other Ambulatory Visit: Payer: Self-pay | Admitting: *Deleted

## 2020-12-18 ENCOUNTER — Other Ambulatory Visit: Payer: Self-pay

## 2020-12-18 ENCOUNTER — Inpatient Hospital Stay: Payer: Medicare Other | Attending: Physician Assistant

## 2020-12-18 ENCOUNTER — Encounter: Payer: Self-pay | Admitting: Physician Assistant

## 2020-12-18 VITALS — BP 134/67 | HR 71 | Temp 97.7°F | Resp 19 | Ht 72.0 in | Wt 158.3 lb

## 2020-12-18 DIAGNOSIS — D508 Other iron deficiency anemias: Secondary | ICD-10-CM

## 2020-12-18 DIAGNOSIS — E876 Hypokalemia: Secondary | ICD-10-CM

## 2020-12-18 DIAGNOSIS — I10 Essential (primary) hypertension: Secondary | ICD-10-CM

## 2020-12-18 DIAGNOSIS — R6889 Other general symptoms and signs: Secondary | ICD-10-CM

## 2020-12-18 DIAGNOSIS — K921 Melena: Secondary | ICD-10-CM | POA: Insufficient documentation

## 2020-12-18 DIAGNOSIS — I2510 Atherosclerotic heart disease of native coronary artery without angina pectoris: Secondary | ICD-10-CM | POA: Insufficient documentation

## 2020-12-18 DIAGNOSIS — D500 Iron deficiency anemia secondary to blood loss (chronic): Secondary | ICD-10-CM

## 2020-12-18 DIAGNOSIS — Z79899 Other long term (current) drug therapy: Secondary | ICD-10-CM

## 2020-12-18 DIAGNOSIS — Z8673 Personal history of transient ischemic attack (TIA), and cerebral infarction without residual deficits: Secondary | ICD-10-CM | POA: Insufficient documentation

## 2020-12-18 DIAGNOSIS — D509 Iron deficiency anemia, unspecified: Secondary | ICD-10-CM | POA: Insufficient documentation

## 2020-12-18 LAB — CBC WITH DIFFERENTIAL (CANCER CENTER ONLY)
Abs Immature Granulocytes: 0.01 10*3/uL (ref 0.00–0.07)
Basophils Absolute: 0 10*3/uL (ref 0.0–0.1)
Basophils Relative: 1 %
Eosinophils Absolute: 0 10*3/uL (ref 0.0–0.5)
Eosinophils Relative: 1 %
HCT: 24.7 % — ABNORMAL LOW (ref 39.0–52.0)
Hemoglobin: 7.7 g/dL — ABNORMAL LOW (ref 13.0–17.0)
Immature Granulocytes: 0 %
Lymphocytes Relative: 28 %
Lymphs Abs: 1.1 10*3/uL (ref 0.7–4.0)
MCH: 26.6 pg (ref 26.0–34.0)
MCHC: 31.2 g/dL (ref 30.0–36.0)
MCV: 85.2 fL (ref 80.0–100.0)
Monocytes Absolute: 0.5 10*3/uL (ref 0.1–1.0)
Monocytes Relative: 13 %
Neutro Abs: 2.3 10*3/uL (ref 1.7–7.7)
Neutrophils Relative %: 57 %
Platelet Count: 371 10*3/uL (ref 150–400)
RBC: 2.9 MIL/uL — ABNORMAL LOW (ref 4.22–5.81)
RDW: 15.2 % (ref 11.5–15.5)
WBC Count: 4 10*3/uL (ref 4.0–10.5)
nRBC: 0 % (ref 0.0–0.2)

## 2020-12-18 LAB — CMP (CANCER CENTER ONLY)
ALT: 14 U/L (ref 0–44)
AST: 17 U/L (ref 15–41)
Albumin: 3.4 g/dL — ABNORMAL LOW (ref 3.5–5.0)
Alkaline Phosphatase: 62 U/L (ref 38–126)
Anion gap: 10 (ref 5–15)
BUN: 12 mg/dL (ref 8–23)
CO2: 32 mmol/L (ref 22–32)
Calcium: 8.7 mg/dL — ABNORMAL LOW (ref 8.9–10.3)
Chloride: 98 mmol/L (ref 98–111)
Creatinine: 0.68 mg/dL (ref 0.61–1.24)
GFR, Estimated: >60 mL/min (ref >60)
Glucose, Bld: 104 mg/dL — ABNORMAL HIGH (ref 70–99)
Potassium: 2.9 mmol/L — ABNORMAL LOW (ref 3.5–5.1)
Sodium: 140 mmol/L (ref 135–145)
Total Bilirubin: 0.4 mg/dL (ref 0.3–1.2)
Total Protein: 6.3 g/dL — ABNORMAL LOW (ref 6.5–8.1)

## 2020-12-18 LAB — RETIC PANEL
Immature Retic Fract: 21.3 % — ABNORMAL HIGH (ref 2.3–15.9)
RBC.: 2.94 MIL/uL — ABNORMAL LOW (ref 4.22–5.81)
Retic Count, Absolute: 68.2 10*3/uL (ref 19.0–186.0)
Retic Ct Pct: 2.3 % (ref 0.4–3.1)
Reticulocyte Hemoglobin: 24 pg — ABNORMAL LOW (ref >27.9)

## 2020-12-18 LAB — ABO/RH: ABO/RH(D): A NEG

## 2020-12-18 LAB — FOLATE: Folate: 11.6 ng/mL (ref >5.9)

## 2020-12-18 LAB — VITAMIN B12: Vitamin B-12: 207 pg/mL (ref 180–914)

## 2020-12-18 NOTE — Telephone Encounter (Signed)
Pt called requesting call back about labs, please advise

## 2020-12-19 ENCOUNTER — Inpatient Hospital Stay: Payer: Medicare Other

## 2020-12-19 DIAGNOSIS — M109 Gout, unspecified: Secondary | ICD-10-CM | POA: Insufficient documentation

## 2020-12-21 ENCOUNTER — Telehealth: Payer: Self-pay | Admitting: *Deleted

## 2020-12-21 LAB — IRON AND TIBC
Iron: 21 ug/dL — ABNORMAL LOW (ref 42–163)
Saturation Ratios: 6 % — ABNORMAL LOW (ref 20–55)
TIBC: 374 ug/dL (ref 202–409)
UIBC: 353 ug/dL (ref 117–376)

## 2020-12-21 LAB — ERYTHROPOIETIN: Erythropoietin: 262.4 m[IU]/mL — ABNORMAL HIGH (ref 2.6–18.5)

## 2020-12-21 LAB — FERRITIN: Ferritin: 12 ng/mL — ABNORMAL LOW (ref 24–336)

## 2020-12-21 NOTE — Telephone Encounter (Signed)
Izora Gala OT called to request extension on OT (recerted) 2wk4 for continued ADL training, then they plan to transition to outpt. Approval given.

## 2020-12-22 ENCOUNTER — Other Ambulatory Visit: Payer: Self-pay

## 2020-12-22 ENCOUNTER — Telehealth: Payer: Self-pay | Admitting: Physician Assistant

## 2020-12-22 ENCOUNTER — Telehealth: Payer: Self-pay | Admitting: *Deleted

## 2020-12-22 ENCOUNTER — Inpatient Hospital Stay: Payer: Medicare Other

## 2020-12-22 ENCOUNTER — Inpatient Hospital Stay (HOSPITAL_BASED_OUTPATIENT_CLINIC_OR_DEPARTMENT_OTHER): Payer: Medicare Other | Admitting: Medical

## 2020-12-22 VITALS — BP 126/94 | HR 78 | Temp 98.1°F | Resp 18 | Ht 72.0 in | Wt 157.8 lb

## 2020-12-22 DIAGNOSIS — D509 Iron deficiency anemia, unspecified: Secondary | ICD-10-CM

## 2020-12-22 DIAGNOSIS — D508 Other iron deficiency anemias: Secondary | ICD-10-CM

## 2020-12-22 DIAGNOSIS — E876 Hypokalemia: Secondary | ICD-10-CM

## 2020-12-22 DIAGNOSIS — D500 Iron deficiency anemia secondary to blood loss (chronic): Secondary | ICD-10-CM | POA: Diagnosis not present

## 2020-12-22 LAB — CBC WITH DIFFERENTIAL (CANCER CENTER ONLY)
Abs Immature Granulocytes: 0 10*3/uL (ref 0.00–0.07)
Basophils Absolute: 0.1 10*3/uL (ref 0.0–0.1)
Basophils Relative: 1 %
Eosinophils Absolute: 0 10*3/uL (ref 0.0–0.5)
Eosinophils Relative: 1 %
HCT: 26 % — ABNORMAL LOW (ref 39.0–52.0)
Hemoglobin: 8 g/dL — ABNORMAL LOW (ref 13.0–17.0)
Immature Granulocytes: 0 %
Lymphocytes Relative: 31 %
Lymphs Abs: 1.2 10*3/uL (ref 0.7–4.0)
MCH: 26.5 pg (ref 26.0–34.0)
MCHC: 30.8 g/dL (ref 30.0–36.0)
MCV: 86.1 fL (ref 80.0–100.0)
Monocytes Absolute: 0.4 10*3/uL (ref 0.1–1.0)
Monocytes Relative: 11 %
Neutro Abs: 2.1 10*3/uL (ref 1.7–7.7)
Neutrophils Relative %: 56 %
Platelet Count: 389 10*3/uL (ref 150–400)
RBC: 3.02 MIL/uL — ABNORMAL LOW (ref 4.22–5.81)
RDW: 15.3 % (ref 11.5–15.5)
WBC Count: 3.8 10*3/uL — ABNORMAL LOW (ref 4.0–10.5)
nRBC: 0 % (ref 0.0–0.2)

## 2020-12-22 LAB — SAMPLE TO BLOOD BANK

## 2020-12-22 LAB — POTASSIUM: Potassium: 3.5 mmol/L (ref 3.5–5.1)

## 2020-12-22 LAB — PREPARE RBC (CROSSMATCH)

## 2020-12-22 NOTE — Progress Notes (Signed)
Per Dede Query, PA, no transfusion or potassium needed today. Copy of labs given to patient and wife. Verbalized understanding.

## 2020-12-22 NOTE — Telephone Encounter (Signed)
I called patient's wife, Mrs. Weathington, to review the labs from last Friday. CBC does show persistent anemia with hemoglobin of 7.7. Since patient is symptomatic with profound fatigue, we will arrange for 1 unit of blood today at [facility]. In addition, patient's potassium level continues to be low at 2.9. Patient was started on oral potassium chloride 40 mEq daily by his PCP that he picked up the prescription on Friday, 12/18/20. We will repeat potassium level today and replace with IV potassium if needed.   I called patient's gastroenterologist, Dr. Mia Creek, and requested the scheduled colonoscopy be expedited to rule out GI bleed.

## 2020-12-22 NOTE — Telephone Encounter (Signed)
Mark PT called for approval for POC 1wk1,2wk3,1wk2 beginning 12/23/20.  Approval given.

## 2020-12-22 NOTE — Telephone Encounter (Signed)
Scheduled appts per 3/25 sch msg. [name] wife is aware.

## 2020-12-23 ENCOUNTER — Telehealth: Payer: Self-pay | Admitting: Physician Assistant

## 2020-12-23 NOTE — Telephone Encounter (Signed)
Scheduled appts per 3/29 sch msg. [name] wife is aware.

## 2020-12-24 LAB — TYPE AND SCREEN
ABO/RH(D): A NEG
Antibody Screen: NEGATIVE
Unit division: 0

## 2020-12-24 LAB — BPAM RBC
Blood Product Expiration Date: 202204292359
Unit Type and Rh: 600

## 2020-12-25 DIAGNOSIS — E785 Hyperlipidemia, unspecified: Secondary | ICD-10-CM

## 2020-12-25 DIAGNOSIS — I69354 Hemiplegia and hemiparesis following cerebral infarction affecting left non-dominant side: Secondary | ICD-10-CM | POA: Diagnosis not present

## 2020-12-25 DIAGNOSIS — D500 Iron deficiency anemia secondary to blood loss (chronic): Secondary | ICD-10-CM

## 2020-12-25 DIAGNOSIS — Z87440 Personal history of urinary (tract) infections: Secondary | ICD-10-CM

## 2020-12-25 DIAGNOSIS — M109 Gout, unspecified: Secondary | ICD-10-CM

## 2020-12-25 DIAGNOSIS — R1310 Dysphagia, unspecified: Secondary | ICD-10-CM

## 2020-12-25 DIAGNOSIS — I69328 Other speech and language deficits following cerebral infarction: Secondary | ICD-10-CM | POA: Diagnosis not present

## 2020-12-25 DIAGNOSIS — K5900 Constipation, unspecified: Secondary | ICD-10-CM

## 2020-12-25 DIAGNOSIS — I10 Essential (primary) hypertension: Secondary | ICD-10-CM

## 2020-12-25 DIAGNOSIS — I2510 Atherosclerotic heart disease of native coronary artery without angina pectoris: Secondary | ICD-10-CM

## 2020-12-25 DIAGNOSIS — I951 Orthostatic hypotension: Secondary | ICD-10-CM

## 2020-12-25 DIAGNOSIS — Z7982 Long term (current) use of aspirin: Secondary | ICD-10-CM

## 2020-12-28 ENCOUNTER — Ambulatory Visit: Payer: Medicare Other

## 2020-12-31 ENCOUNTER — Inpatient Hospital Stay: Payer: Medicare Other | Attending: Physician Assistant

## 2020-12-31 ENCOUNTER — Other Ambulatory Visit: Payer: Self-pay

## 2020-12-31 ENCOUNTER — Telehealth: Payer: Self-pay

## 2020-12-31 VITALS — BP 162/64 | HR 46 | Temp 98.1°F | Resp 17

## 2020-12-31 DIAGNOSIS — D509 Iron deficiency anemia, unspecified: Secondary | ICD-10-CM | POA: Insufficient documentation

## 2020-12-31 MED ORDER — SODIUM CHLORIDE 0.9 % IV SOLN
Freq: Once | INTRAVENOUS | Status: AC
  Filled 2020-12-31: qty 250

## 2020-12-31 MED ORDER — FERUMOXYTOL INJECTION 510 MG/17 ML
510.0000 mg | Freq: Once | INTRAVENOUS | Status: AC
  Administered 2020-12-31: 510 mg via INTRAVENOUS
  Filled 2020-12-31: qty 510

## 2020-12-31 NOTE — Telephone Encounter (Signed)
Rick, OT from [facility] called stating patient missed 2 visits this week.

## 2020-12-31 NOTE — Patient Instructions (Signed)
Ferumoxytol injection What is this medicine? FERUMOXYTOL is an iron complex. Iron is used to make healthy red blood cells, which carry oxygen and nutrients throughout the body. This medicine is used to treat iron deficiency anemia. This medicine may be used for other purposes; ask your health care provider or pharmacist if you have questions. COMMON BRAND NAME(S): Feraheme What should I tell my health care provider before I take this medicine? They need to know if you have any of these conditions:  anemia not caused by low iron levels  high levels of iron in the blood  magnetic resonance imaging (MRI) test scheduled  an unusual or allergic reaction to iron, other medicines, foods, dyes, or preservatives  pregnant or trying to get pregnant  breast-feeding How should I use this medicine? This medicine is for injection into a vein. It is given by a health care professional in a hospital or clinic setting. Talk to your pediatrician regarding the use of this medicine in children. Special care may be needed. Overdosage: If you think you have taken too much of this medicine contact a poison control center or emergency room at once. NOTE: This medicine is only for you. Do not share this medicine with others. What if I miss a dose? It is important not to miss your dose. Call your doctor or health care professional if you are unable to keep an appointment. What may interact with this medicine? This medicine may interact with the following medications:  other iron products This list may not describe all possible interactions. Give your health care provider a list of all the medicines, herbs, non-prescription drugs, or dietary supplements you use. Also tell them if you smoke, drink alcohol, or use illegal drugs. Some items may interact with your medicine. What should I watch for while using this medicine? Visit your doctor or healthcare professional regularly. Tell your doctor or healthcare  professional if your symptoms do not start to get better or if they get worse. You may need blood work done while you are taking this medicine. You may need to follow a special diet. Talk to your doctor. Foods that contain iron include: whole grains/cereals, dried fruits, beans, or peas, leafy green vegetables, and organ meats (liver, kidney). What side effects may I notice from receiving this medicine? Side effects that you should report to your doctor or health care professional as soon as possible:  allergic reactions like skin rash, itching or hives, swelling of the face, lips, or tongue  breathing problems  changes in blood pressure  feeling faint or lightheaded, falls  fever or chills  flushing, sweating, or hot feelings  swelling of the ankles or feet Side effects that usually do not require medical attention (report to your doctor or health care professional if they continue or are bothersome):  diarrhea  headache  nausea, vomiting  stomach pain This list may not describe all possible side effects. Call your doctor for medical advice about side effects. You may report side effects to FDA at 1-800-FDA-1088. Where should I keep my medicine? This drug is given in a hospital or clinic and will not be stored at home. NOTE: This sheet is a summary. It may not cover all possible information. If you have questions about this medicine, talk to your doctor, pharmacist, or health care provider.  2021 Elsevier/Gold Standard (2016-10-31 20:21:10)  

## 2021-01-01 ENCOUNTER — Telehealth: Payer: Self-pay | Admitting: Physician Assistant

## 2021-01-01 NOTE — Telephone Encounter (Signed)
I called and left a voicemail to discuss the colonoscopy findings from 12/28/20. Dr. Zeb Comfort performed the colonoscopy and upon review of records, there was a rectal mass that was noted at 12 cm. Dr. Mia Creek requested imaging with CT CAP on 01/01/21. I will confirm with the patient and/or his wife that follow up is made and to discuss next steps.

## 2021-01-04 ENCOUNTER — Ambulatory Visit: Payer: Medicare Other

## 2021-01-06 ENCOUNTER — Other Ambulatory Visit: Payer: Self-pay | Admitting: Physician Assistant

## 2021-01-07 ENCOUNTER — Inpatient Hospital Stay: Payer: Medicare Other

## 2021-01-07 ENCOUNTER — Other Ambulatory Visit: Payer: Self-pay

## 2021-01-07 VITALS — BP 146/73 | HR 55 | Temp 97.6°F | Resp 18

## 2021-01-07 DIAGNOSIS — D509 Iron deficiency anemia, unspecified: Secondary | ICD-10-CM | POA: Diagnosis not present

## 2021-01-07 MED ORDER — SODIUM CHLORIDE 0.9 % IV SOLN
Freq: Once | INTRAVENOUS | Status: AC
Start: 2021-01-07 — End: 2021-01-07
  Filled 2021-01-07: qty 250

## 2021-01-07 MED ORDER — SODIUM CHLORIDE 0.9 % IV SOLN
510.0000 mg | Freq: Once | INTRAVENOUS | Status: AC
  Administered 2021-01-07: 510 mg via INTRAVENOUS
  Filled 2021-01-07: qty 510

## 2021-01-07 NOTE — Patient Instructions (Signed)
Ferumoxytol injection What is this medicine? FERUMOXYTOL is an iron complex. Iron is used to make healthy red blood cells, which carry oxygen and nutrients throughout the body. This medicine is used to treat iron deficiency anemia. This medicine may be used for other purposes; ask your health care provider or pharmacist if you have questions. COMMON BRAND NAME(S): Feraheme What should I tell my health care provider before I take this medicine? They need to know if you have any of these conditions:  anemia not caused by low iron levels  high levels of iron in the blood  magnetic resonance imaging (MRI) test scheduled  an unusual or allergic reaction to iron, other medicines, foods, dyes, or preservatives  pregnant or trying to get pregnant  breast-feeding How should I use this medicine? This medicine is for injection into a vein. It is given by a health care professional in a hospital or clinic setting. Talk to your pediatrician regarding the use of this medicine in children. Special care may be needed. Overdosage: If you think you have taken too much of this medicine contact a poison control center or emergency room at once. NOTE: This medicine is only for you. Do not share this medicine with others. What if I miss a dose? It is important not to miss your dose. Call your doctor or health care professional if you are unable to keep an appointment. What may interact with this medicine? This medicine may interact with the following medications:  other iron products This list may not describe all possible interactions. Give your health care provider a list of all the medicines, herbs, non-prescription drugs, or dietary supplements you use. Also tell them if you smoke, drink alcohol, or use illegal drugs. Some items may interact with your medicine. What should I watch for while using this medicine? Visit your doctor or healthcare professional regularly. Tell your doctor or healthcare  professional if your symptoms do not start to get better or if they get worse. You may need blood work done while you are taking this medicine. You may need to follow a special diet. Talk to your doctor. Foods that contain iron include: whole grains/cereals, dried fruits, beans, or peas, leafy green vegetables, and organ meats (liver, kidney). What side effects may I notice from receiving this medicine? Side effects that you should report to your doctor or health care professional as soon as possible:  allergic reactions like skin rash, itching or hives, swelling of the face, lips, or tongue  breathing problems  changes in blood pressure  feeling faint or lightheaded, falls  fever or chills  flushing, sweating, or hot feelings  swelling of the ankles or feet Side effects that usually do not require medical attention (report to your doctor or health care professional if they continue or are bothersome):  diarrhea  headache  nausea, vomiting  stomach pain This list may not describe all possible side effects. Call your doctor for medical advice about side effects. You may report side effects to FDA at 1-800-FDA-1088. Where should I keep my medicine? This drug is given in a hospital or clinic and will not be stored at home. NOTE: This sheet is a summary. It may not cover all possible information. If you have questions about this medicine, talk to your doctor, pharmacist, or health care provider.  2021 Elsevier/Gold Standard (2016-10-31 20:21:10)  

## 2021-01-13 DIAGNOSIS — C20 Malignant neoplasm of rectum: Secondary | ICD-10-CM | POA: Insufficient documentation

## 2021-01-13 DIAGNOSIS — C189 Malignant neoplasm of colon, unspecified: Secondary | ICD-10-CM | POA: Insufficient documentation

## 2021-01-13 DIAGNOSIS — Z85048 Personal history of other malignant neoplasm of rectum, rectosigmoid junction, and anus: Secondary | ICD-10-CM | POA: Insufficient documentation

## 2021-01-18 ENCOUNTER — Inpatient Hospital Stay: Payer: Medicare Other

## 2021-01-18 ENCOUNTER — Inpatient Hospital Stay: Payer: Medicare Other | Admitting: Physician Assistant

## 2021-01-19 ENCOUNTER — Encounter: Payer: Medicare Other | Admitting: Physical Medicine & Rehabilitation

## 2021-02-05 ENCOUNTER — Telehealth: Payer: Self-pay

## 2021-02-05 NOTE — Telephone Encounter (Signed)
Error

## 2021-03-03 HISTORY — PX: LAPAROSCOPIC COLON RESECTION: SUR791

## 2021-03-05 HISTORY — PX: PACEMAKER INSERTION: SHX728

## 2021-03-14 DIAGNOSIS — M109 Gout, unspecified: Secondary | ICD-10-CM | POA: Insufficient documentation

## 2021-03-19 ENCOUNTER — Encounter: Payer: Self-pay | Admitting: Nurse Practitioner

## 2021-03-19 ENCOUNTER — Other Ambulatory Visit: Payer: Self-pay

## 2021-03-19 ENCOUNTER — Ambulatory Visit (INDEPENDENT_AMBULATORY_CARE_PROVIDER_SITE_OTHER): Payer: Medicare Other | Admitting: Nurse Practitioner

## 2021-03-19 VITALS — BP 100/60 | HR 60 | Temp 97.1°F

## 2021-03-19 DIAGNOSIS — D500 Iron deficiency anemia secondary to blood loss (chronic): Secondary | ICD-10-CM | POA: Diagnosis not present

## 2021-03-19 DIAGNOSIS — G903 Multi-system degeneration of the autonomic nervous system: Secondary | ICD-10-CM

## 2021-03-19 DIAGNOSIS — K257 Chronic gastric ulcer without hemorrhage or perforation: Secondary | ICD-10-CM | POA: Diagnosis not present

## 2021-03-19 MED ORDER — GABAPENTIN 300 MG PO CAPS: 300.0000 mg | ORAL_CAPSULE | Freq: Three times a day (TID) | ORAL | 3 refills | Status: DC

## 2021-03-19 MED ORDER — PANTOPRAZOLE SODIUM 20 MG PO TBEC: 20.0000 mg | DELAYED_RELEASE_TABLET | Freq: Every day | ORAL | 1 refills | Status: DC

## 2021-03-19 NOTE — Assessment & Plan Note (Signed)
Decrease in BP when florinef dose decreased to 0.1mg  BID. Tyler Richards reports syncopal episode after hospital discharge.  BP Readings from Last 3 Encounters:  03/19/21 100/60  01/07/21 (!) 146/73  12/31/20 (!) 162/64   Advised to increase florinef dose to 0.1mg  1.5tba BID

## 2021-03-19 NOTE — Progress Notes (Signed)
Subjective:  Patient ID: Tyler Richards, male    DOB: 30-Jan-1951  Age: 70 y.o. MRN: [id number]  CC: Hospitalization Follow-up (Pt c/o gout flare up, which lead him to hospital. )  HPI Accompanied by wife Gastric ulcer Continue pantoprazole  Neurogenic orthostatic hypotension (HCC) Decrease in BP when florinef dose decreased to 0.1mg  BID. [name] reports syncopal episode after hospital discharge.  BP Readings from Last 3 Encounters:  03/19/21 100/60  01/07/21 (!) 146/73  12/31/20 (!) 162/64   Advised to increase florinef dose to 0.1mg  1.5tba BID Wt Readings from Last 3 Encounters:  12/22/20 157 lb 12.8 oz (71.6 kg)  12/18/20 158 lb 4.8 oz (71.8 kg)  12/09/20 160 lb (72.6 kg)    BP Readings from Last 3 Encounters:  03/19/21 100/60  01/07/21 (!) 146/73  12/31/20 (!) 162/64     Reviewed past Medical, Social and Family history today.  Outpatient Medications Prior to Visit  Medication Sig Dispense Refill   acetaminophen (TYLENOL) 325 MG tablet Take 650 mg by mouth every 6 (six) hours as needed for mild pain or headache.     allopurinol (ZYLOPRIM) 100 MG tablet Take 100 mg by mouth daily.     amoxicillin-clavulanate (AUGMENTIN) 875-125 MG tablet Take 1 tablet by mouth 2 (two) times daily.     atorvastatin (LIPITOR) 40 MG tablet Take 1 tablet (40 mg total) by mouth daily. 90 tablet 3   dibucaine (NUPERCAINAL) 1 % ointment Apply topically as needed. Apply to affected area 30 g 0   fludrocortisone (FLORINEF) 0.1 MG tablet Take 1.5 tablets (0.15 mg total) by mouth 2 (two) times daily. 252 tablet 3   fluticasone (FLONASE) 50 MCG/ACT nasal spray one spray by Both Nostrils route daily for 30 days.     midodrine (PROAMATINE) 10 MG tablet Take 1 tablet (10 mg total) by mouth 2 (two) times daily with a meal. 60 tablet 5   potassium chloride SA (KLOR-CON) 20 MEQ tablet Take 2 tablets (40 mEq total) by mouth daily. 8 tablet 0   predniSONE (DELTASONE) 20 MG tablet Take by mouth.      gabapentin (NEURONTIN) 300 MG capsule Take 1 capsule (300 mg total) by mouth 3 (three) times daily. 90 capsule 0   pantoprazole (PROTONIX) 20 MG tablet Take 1 tablet (20 mg total) by mouth daily. 90 tablet 0   senna (SENOKOT) 8.6 MG TABS tablet Take 2 tablets (17.2 mg total) by mouth at bedtime as needed for mild constipation. 120 tablet 0   dicyclomine (BENTYL) 20 MG tablet Take by mouth.     diclofenac Sodium (VOLTAREN) 1 % GEL APPLY 2 G TOPICALLY 3 (THREE) TIMES DAILY. (Patient not taking: Reported on 03/19/2021) 200 g 0   docusate sodium (COLACE) 100 MG capsule Take 100 mg by mouth 2 (two) times daily. (Patient not taking: Reported on 03/19/2021)     pantoprazole (PROTONIX) 40 MG tablet Take 40 mg by mouth 2 (two) times daily. (Patient not taking: Reported on 03/19/2021)     polyethylene glycol (MIRALAX / GLYCOLAX) 17 g packet Take 17 g by mouth daily. (Patient not taking: Reported on 03/19/2021)     polyethylene glycol powder (GLYCOLAX/MIRALAX) 17 GM/SCOOP powder Take by mouth. (Patient not taking: Reported on 03/19/2021)     saccharomyces boulardii (FLORASTOR) 250 MG capsule Take 250 mg by mouth 2 (two) times daily. (Patient not taking: Reported on 03/19/2021)     witch hazel-glycerin (TUCKS) pad Apply 1 application topically as needed for itching. (Patient  not taking: Reported on 03/19/2021)     No facility-administered medications prior to visit.    ROS See HPI  Objective:  BP 100/60 (BP Location: Right Arm, Patient Position: Sitting, Cuff Size: Normal)   Pulse 60   Temp (!) 97.1 F (36.2 C) (Temporal)   SpO2 99%   Physical Exam Vitals reviewed.  Cardiovascular:     Rate and Rhythm: Normal rate and regular rhythm.     Pulses: Normal pulses.  Pulmonary:     Effort: Pulmonary effort is normal.     Breath sounds: Normal breath sounds.  Abdominal:     General: Bowel sounds are normal. There is no distension.     Palpations: Abdomen is soft.     Tenderness: There is no abdominal  tenderness.     Comments: Ostomy bag present RLQ: pink and moist. Liquid stool in bag  Musculoskeletal:        General: Normal range of motion.  Neurological:     Mental Status: He is alert and oriented to person, place, and time.  Psychiatric:        Mood and Affect: Mood normal.    Assessment & Plan:  This visit occurred during the SARS-CoV-2 public health emergency.  Safety protocols were in place, including screening questions prior to the visit, additional usage of staff PPE, and extensive cleaning of exam room while observing appropriate contact time as indicated for disinfecting solutions.   [name] was seen today for hospitalization follow-up.  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  Neurogenic orthostatic hypotension (HCC) -     CBC w/Diff; Future -     Basic metabolic panel; Future  Other orders -     gabapentin (NEURONTIN) 300 MG capsule; Take 1 capsule (300 mg total) by mouth 3 (three) times daily.   Problem List Items Addressed This Visit       Cardiovascular and Mediastinum   Neurogenic orthostatic hypotension (HCC)    Decrease in BP when florinef dose decreased to 0.1mg  BID. [name] reports syncopal episode after hospital discharge.  BP Readings from Last 3 Encounters:  03/19/21 100/60  01/07/21 (!) 146/73  12/31/20 (!) 162/64   Advised to increase florinef dose to 0.1mg  1.5tba BID       Relevant Orders   CBC w/Diff   Basic metabolic panel     Digestive   Gastric ulcer    Continue pantoprazole       Relevant Medications   pantoprazole (PROTONIX) 20 MG tablet     Other   Iron deficiency anemia due to chronic blood loss - Primary    Follow-up: Return in about 4 weeks (around 04/16/2021) for hypotension (53mins).  Wilfred Lacy, NP

## 2021-03-19 NOTE — Patient Instructions (Signed)
Schedule lab appt for blood draw  Increase florinef to 1.5tab BID Continue other medications as prescribed

## 2021-03-19 NOTE — Assessment & Plan Note (Signed)
Continue pantoprazole. °

## 2021-03-22 ENCOUNTER — Telehealth: Payer: Self-pay | Admitting: Nurse Practitioner

## 2021-03-22 NOTE — Telephone Encounter (Signed)
Tyler Richards is calling to get verbal orders for nursing. Patient has a new Ostomy. The frequency will be 2 week 1 and 1 week 8. Please call her back at [phone].

## 2021-03-24 ENCOUNTER — Other Ambulatory Visit (INDEPENDENT_AMBULATORY_CARE_PROVIDER_SITE_OTHER): Payer: Medicare Other

## 2021-03-24 ENCOUNTER — Telehealth: Payer: Self-pay

## 2021-03-24 ENCOUNTER — Other Ambulatory Visit: Payer: Self-pay

## 2021-03-24 DIAGNOSIS — G903 Multi-system degeneration of the autonomic nervous system: Secondary | ICD-10-CM | POA: Diagnosis not present

## 2021-03-24 LAB — BASIC METABOLIC PANEL
BUN: 17 mg/dL (ref 6–23)
CO2: 23 mEq/L (ref 19–32)
Calcium: 10 mg/dL (ref 8.4–10.5)
Chloride: 105 mEq/L (ref 96–112)
Creatinine, Ser: 0.75 mg/dL (ref 0.40–1.50)
GFR: 91.15 mL/min (ref >60.00)
Glucose, Bld: 99 mg/dL (ref 70–99)
Potassium: 4.5 mEq/L (ref 3.5–5.1)
Sodium: 137 mEq/L (ref 135–145)

## 2021-03-24 LAB — CBC WITH DIFFERENTIAL/PLATELET
Basophils Absolute: 0.1 10*3/uL (ref 0.0–0.1)
Basophils Relative: 0.8 % (ref 0.0–3.0)
Eosinophils Absolute: 0 10*3/uL (ref 0.0–0.7)
Eosinophils Relative: 0 % (ref 0.0–5.0)
HCT: 37.9 % — ABNORMAL LOW (ref 39.0–52.0)
Hemoglobin: 12.8 g/dL — ABNORMAL LOW (ref 13.0–17.0)
Lymphocytes Relative: 11.1 % — ABNORMAL LOW (ref 12.0–46.0)
Lymphs Abs: 0.8 10*3/uL (ref 0.7–4.0)
MCHC: 33.8 g/dL (ref 30.0–36.0)
MCV: 95.8 fl (ref 78.0–100.0)
Monocytes Absolute: 0.6 10*3/uL (ref 0.1–1.0)
Monocytes Relative: 9.2 % (ref 3.0–12.0)
Neutro Abs: 5.4 10*3/uL (ref 1.4–7.7)
Neutrophils Relative %: 78.9 % — ABNORMAL HIGH (ref 43.0–77.0)
Platelets: 682 10*3/uL — ABNORMAL HIGH (ref 150.0–400.0)
RBC: 3.95 Mil/uL — ABNORMAL LOW (ref 4.22–5.81)
RDW: 21.6 % — ABNORMAL HIGH (ref 11.5–15.5)
WBC: 6.8 10*3/uL (ref 4.0–10.5)

## 2021-03-24 NOTE — Telephone Encounter (Signed)
Mark Bias, PT calling from [facility] to get orders for pt to have, PT twice a week for 7 weeks, and then once a week for 2 weeks, starting 03/23/21.  Please advise. [name] said that a message can be left on his VM if he isn't able to answer.  CB#[phone]

## 2021-03-24 NOTE — Progress Notes (Signed)
Per orders of [facility] pt is here for labs, pt tolerated draw well.

## 2021-03-25 NOTE — Telephone Encounter (Signed)
[facility] is calling back to check the status of verbal orders. Is this something that another provider can approve? Please call her back at [phone] and update.

## 2021-03-30 NOTE — Telephone Encounter (Signed)
Verbal orders given  

## 2021-03-30 NOTE — Telephone Encounter (Signed)
Verbal order given  

## 2021-04-01 DIAGNOSIS — Z923 Personal history of irradiation: Secondary | ICD-10-CM | POA: Insufficient documentation

## 2021-04-01 DIAGNOSIS — Z950 Presence of cardiac pacemaker: Secondary | ICD-10-CM | POA: Insufficient documentation

## 2021-04-07 ENCOUNTER — Telehealth (HOSPITAL_COMMUNITY): Payer: Self-pay

## 2021-04-07 NOTE — Telephone Encounter (Signed)
Called to schedule us carotid, no answer, left vm. AW  

## 2021-04-19 ENCOUNTER — Ambulatory Visit (INDEPENDENT_AMBULATORY_CARE_PROVIDER_SITE_OTHER): Payer: Medicare Other | Admitting: Nurse Practitioner

## 2021-04-19 ENCOUNTER — Other Ambulatory Visit: Payer: Self-pay

## 2021-04-19 ENCOUNTER — Encounter: Payer: Self-pay | Admitting: Nurse Practitioner

## 2021-04-19 VITALS — BP 122/80 | HR 66 | Temp 97.0°F | Ht 72.0 in | Wt 129.7 lb

## 2021-04-19 DIAGNOSIS — M109 Gout, unspecified: Secondary | ICD-10-CM | POA: Diagnosis not present

## 2021-04-19 DIAGNOSIS — G903 Multi-system degeneration of the autonomic nervous system: Secondary | ICD-10-CM

## 2021-04-19 DIAGNOSIS — I63511 Cerebral infarction due to unspecified occlusion or stenosis of right middle cerebral artery: Secondary | ICD-10-CM | POA: Diagnosis not present

## 2021-04-19 DIAGNOSIS — R634 Abnormal weight loss: Secondary | ICD-10-CM

## 2021-04-19 MED ORDER — ALLOPURINOL 100 MG PO TABS: 100.0000 mg | ORAL_TABLET | Freq: Every day | ORAL | 3 refills | Status: DC

## 2021-04-19 MED ORDER — MIDODRINE HCL 10 MG PO TABS: 10.0000 mg | ORAL_TABLET | Freq: Two times a day (BID) | ORAL | 3 refills | Status: DC

## 2021-04-19 MED ORDER — ATORVASTATIN CALCIUM 40 MG PO TABS: 40.0000 mg | ORAL_TABLET | Freq: Every day | ORAL | 3 refills | Status: DC

## 2021-04-19 MED ORDER — FLUDROCORTISONE ACETATE 0.1 MG PO TABS: 0.1500 mg | ORAL_TABLET | Freq: Two times a day (BID) | ORAL | 3 refills | Status: DC

## 2021-04-19 NOTE — Progress Notes (Signed)
Subjective:  Patient ID: Tyler Richards, male    DOB: 07-Jun-1951  Age: 70 y.o. MRN: [id number]  CC: Follow-up (4 week f/u on hypotension. Would like to discuss previous blood work and refill for allopurinol)  HPI  Accompanied by Ms. Tyler Richards. [name] has ongoing home health service (nurse and PT. Waiting for OT to begin)  Neurogenic orthostatic hypotension (HCC) Stable BP with midodrine and florinef BP Readings from Last 3 Encounters:  04/19/21 122/80  03/19/21 100/60  01/07/21 (!) 146/73   Refills sent  Weight loss: 18lbs weight loss noted since 03/19/21. Ms. 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 She is unable to continue liquid protein supplements due to increase colostomy output. [name] denies any ABD pain or dysphagia or nausea or fever or dysuria or urinary retention. Mrs. Tyler Richards continues to encourage small frequent meals and hydration. She denies any pressure ulcers present. [name] states his appetite has improved in last 2weeks. Wt Readings from Last 3 Encounters:  04/19/21 129 lb 11.2 oz (58.8 kg)  12/22/20 157 lb 12.8 oz (71.6 kg)  12/18/20 158 lb 4.8 oz (71.8 kg)    Reviewed past Medical, Social and Family history today.  Outpatient Medications Prior to Visit  Medication Sig Dispense Refill   acetaminophen (TYLENOL) 325 MG tablet Take 650 mg by mouth every 6 (six) hours as needed for mild pain or headache.     dibucaine (NUPERCAINAL) 1 % ointment Apply topically as needed. Apply to affected area 30 g 0   dicyclomine (BENTYL) 20 MG tablet Take by mouth.     gabapentin (NEURONTIN) 300 MG capsule Take 1 capsule (300 mg total) by mouth 3 (three) times daily. 270 capsule 3   pantoprazole (PROTONIX) 20 MG tablet Take 1 tablet (20 mg total) by mouth daily. 90 tablet 1   allopurinol (ZYLOPRIM) 100 MG tablet Take 100 mg by mouth daily.     atorvastatin (LIPITOR) 40 MG tablet Take 1 tablet (40 mg total)  by mouth daily. 90 tablet 3   fludrocortisone (FLORINEF) 0.1 MG tablet Take 1.5 tablets (0.15 mg total) by mouth 2 (two) times daily. 252 tablet 3   midodrine (PROAMATINE) 10 MG tablet Take 1 tablet (10 mg total) by mouth 2 (two) times daily with a meal. 60 tablet 5   potassium chloride SA (KLOR-CON) 20 MEQ tablet Take 2 tablets (40 mEq total) by mouth daily. 8 tablet 0   amoxicillin-clavulanate (AUGMENTIN) 875-125 MG tablet Take 1 tablet by mouth 2 (two) times daily. (Patient not taking: Reported on 04/19/2021)     No facility-administered medications prior to visit.    ROS See HPI  Objective:  BP 122/80 (BP Location: Right Arm, Patient Position: Sitting, Cuff Size: Normal)   Pulse 66   Temp (!) 97 F (36.1 C) (Temporal)   Ht 6' (1.829 m)   Wt 129 lb 11.2 oz (58.8 kg)   SpO2 97%   BMI 17.59 kg/m   Physical Exam Constitutional:      General: He is not in acute distress. Cardiovascular:     Rate and Rhythm: Normal rate.     Pulses: Normal pulses.  Pulmonary:     Effort: Pulmonary effort is normal.     Breath sounds: Normal breath sounds.  Abdominal:     General: Bowel sounds are normal. There is no distension.     Palpations: Abdomen is soft.     Tenderness: There is no  abdominal tenderness. There is no guarding.  Musculoskeletal:     Right lower leg: No edema.     Left lower leg: No edema.  Skin:    General: Skin is warm and dry.  Neurological:     Mental Status: He is alert and oriented to person, place, and time.   Assessment & Plan:  This visit occurred during the SARS-CoV-2 public health emergency.  Safety protocols were in place, including screening questions prior to the visit, additional usage of staff PPE, and extensive cleaning of exam room while observing appropriate contact time as indicated for disinfecting solutions.   Tyler Richards was seen today for follow-up.  Diagnoses and all orders for this visit:  Neurogenic orthostatic hypotension (HCC) -     midodrine  (PROAMATINE) 10 MG tablet; Take 1 tablet (10 mg total) by mouth 2 (two) times daily with a meal. -     fludrocortisone (FLORINEF) 0.1 MG tablet; Take 1.5 tablets (0.15 mg total) by mouth 2 (two) times daily.  Right middle cerebral artery stroke (HCC) -     atorvastatin (LIPITOR) 40 MG tablet; Take 1 tablet (40 mg total) by mouth daily.  Polyarticular gout -     allopurinol (ZYLOPRIM) 100 MG tablet; Take 1 tablet (100 mg total) by mouth daily.  Abnormal weight loss Encourage to use protein power or bars 2-3x/day, continue small frequent meals. Call office with weight in 18month  Problem List Items Addressed This Visit       Cardiovascular and Mediastinum   Neurogenic orthostatic hypotension (HCC) - Primary    Stable BP with midodrine and florinef BP Readings from Last 3 Encounters:  04/19/21 122/80  03/19/21 100/60  01/07/21 (!) 146/73   Refills sent      Relevant Medications   midodrine (PROAMATINE) 10 MG tablet   fludrocortisone (FLORINEF) 0.1 MG tablet   atorvastatin (LIPITOR) 40 MG tablet   Right middle cerebral artery stroke (HCC)   Relevant Medications   midodrine (PROAMATINE) 10 MG tablet   atorvastatin (LIPITOR) 40 MG tablet     Musculoskeletal and Integument   Polyarticular gout   Relevant Medications   allopurinol (ZYLOPRIM) 100 MG tablet   fludrocortisone (FLORINEF) 0.1 MG tablet   Other Visit Diagnoses     Abnormal weight loss           Follow-up: Return in about 3 months (around 07/20/2021) for weight loss, HTN, hyperlipidemia (needs repeat labs, no need to fast) 339ms.  ChWilfred LacyNP

## 2021-04-19 NOTE — Patient Instructions (Signed)
[email or web address]">  High-Protein and High-Calorie Diet Eating high-protein and high-calorie foods can help you to gain weight, heal after an injury, and recover after an illness or surgery. The specific amount of daily protein and calories you need depends on: Your body weight. The reason this diet is recommended for you. Generally, a high-protein, high-calorie diet involves: Eating 250-500 extra calories each day. Making sure that you get enough of your daily calories from protein. Ask your health care provider how many of your calories should come from protein. Talk with a health care provider or a dietitian about how much protein and how many calories you need each day. Follow the diet as directed by your healthcare provider. What are tips for following this plan?  Reading food labels Check the nutrition facts label for calories, grams of fat and protein. Items with more than 4 grams of protein are high-protein foods. Preparing meals Add whole milk, half-and-half, or heavy cream to cereal, pudding, soup, or hot cocoa. Add whole milk to instant breakfast drinks. Add peanut butter to oatmeal or smoothies. Add powdered milk to baked goods, smoothies, or milkshakes. Add powdered milk, cream, or butter to mashed potatoes. Add cheese to cooked vegetables. Make whole-milk yogurt parfaits. Top them with granola, fruit, or nuts. Add cottage cheese to fruit. Add avocado, cheese, or both to sandwiches or salads. Add avocado to smoothies. Add meat, poultry, or seafood to rice, pasta, casseroles, salads, and soups. Use mayonnaise when making egg salad, chicken salad, or tuna salad. Use peanut butter as a dip for fruits and vegetables or as a topping for pretzels, celery, or crackers. Add beans to casseroles, dips, and spreads. Add pureed beans to sauces and soups. Replace calorie-free drinks with  calorie-containing drinks, such as milk and fruit juice. Replace water with milk or heavy cream when making foods such as oatmeal, pudding, or cocoa. Add oil or butter to cooked vegetables and grains. Add cream cheese to sandwiches or as a topping on crackers and bread. Make cream-based pastas and soups. General information Ask your health care provider if you should take a nutritional supplement. Try to eat six small meals each day instead of three large meals. A general goal is to eat every 2 to 3 hours. Eat a balanced diet. In each meal, include one food that is high in protein and one food with fat in it. Keep nutritious snacks available, such as nuts, trail mixes, dried fruit, and yogurt. If you have kidney disease or diabetes, talk with your health care provider about how much protein is safe for you. Too much protein may put extra stress on your kidneys. Drink your calories. Choose high-calorie drinks and have them after your meals. Consider setting a timer to remind you to eat. You will want to eat even if you do not feel very hungry. What high-protein foods should I eat?  Vegetables Soybeans. Peas. Grains Quinoa. Bulgur wheat. Buckwheat. Meats and other proteins Beef, pork, and poultry. Fish and seafood. Eggs. Tofu. Textured vegetable protein (TVP). Peanut butter. Nuts and seeds. Dried beans. Protein powders.Hummus. Dairy Whole milk. Whole-milk yogurt. Powdered milk. Cheese. Yahoo. Eggnog. Beverages High-protein supplement drinks. Soy milk. Other foods Protein bars. The items listed above may not be a complete list of foods and beverages you can eat and drink. Contact a dietitian for more information. What high-calorie foods should I eat? Fruits Dried fruit. Fruit leather. Canned fruit in syrup. Fruit juice. Avocado. Vegetables Vegetables cooked in oil or butter. Fried potatoes. Grains  Pasta. Quick breads. Muffins. Pancakes. Ready-to-eat cereal. Meats and other  proteins Peanut butter. Nuts and seeds. Dairy Heavy cream. Whipped cream. Cream cheese. Sour cream. Ice cream. Custard.Pudding. Whole milk dairy products. Beverages Meal-replacement beverages. Nutrition shakes. Fruit juice. Seasonings and condiments Salad dressing. Mayonnaise. Alfredo sauce. Fruit preserves or jelly. Honey.Syrup. Sweets and desserts Cake. Cookies. Pie. Pastries. Candy bars. Chocolate. Fats and oils Butter or margarine. Oil. Gravy. Other foods Meal-replacement bars. The items listed above may not be a complete list of foods and beverages you can eat and drink. Contact a dietitian for more information. Summary A high-protein, high-calorie diet can help you gain weight or heal faster after an injury, illness, or surgery. To increase your protein and calories, add ingredients such as whole milk, peanut butter, cheese, beans, meat, or seafood to meal items. To get enough extra calories each day, include high-calorie foods and beverages at each meal. Adding a high-calorie drink or shake can be an easy way to help you get enough calories each day. Talk with your healthcare provider or dietitian about the best options for you. This information is not intended to replace advice given to you by your health care provider. Make sure you discuss any questions you have with your healthcare provider. Document Revised: 08/16/2020 Document Reviewed: 08/16/2020 Elsevier Patient Education  2022 [employer].

## 2021-04-19 NOTE — Assessment & Plan Note (Signed)
Stable BP with midodrine and florinef BP Readings from Last 3 Encounters:  04/19/21 122/80  03/19/21 100/60  01/07/21 (!) 146/73   Refills sent

## 2021-04-19 NOTE — Assessment & Plan Note (Signed)
Acute gout flare during recent hospitalization (elbow and ankle), uric acid level at 4.2, normal renal function, resolved with allopurinol '100mg'$ . Refill sent

## 2021-05-06 DIAGNOSIS — R55 Syncope and collapse: Secondary | ICD-10-CM

## 2021-05-06 DIAGNOSIS — E871 Hypo-osmolality and hyponatremia: Secondary | ICD-10-CM | POA: Insufficient documentation

## 2021-05-06 DIAGNOSIS — E860 Dehydration: Secondary | ICD-10-CM | POA: Insufficient documentation

## 2021-05-06 HISTORY — DX: Syncope and collapse: R55

## 2021-05-06 HISTORY — DX: Hypocalcemia: E83.51

## 2021-05-06 HISTORY — DX: Hypo-osmolality and hyponatremia: E87.1

## 2021-05-17 ENCOUNTER — Telehealth: Payer: Self-pay | Admitting: Nurse Practitioner

## 2021-05-17 NOTE — Telephone Encounter (Signed)
Tonya with [facility] calling and requesting orders for home health nursing, 1 week for 4 weeks and then once every other week for another 4 weeks. Ostomy being reversed this week.

## 2021-05-20 NOTE — Telephone Encounter (Signed)
Kenney Houseman called to follow up on this and asked for a call back at [phone]

## 2021-05-21 NOTE — Telephone Encounter (Signed)
Spoke with Kenney Houseman and orders have been received.

## 2021-05-25 ENCOUNTER — Telehealth: Payer: Self-pay | Admitting: Nurse Practitioner

## 2021-05-25 DIAGNOSIS — E876 Hypokalemia: Secondary | ICD-10-CM

## 2021-05-25 NOTE — Telephone Encounter (Signed)
Juliann Pulse from [facility] is calling to request the pt is put on a oral potassium. 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 If you have any questions please call Juliann Pulse at [phone].

## 2021-05-26 MED ORDER — POTASSIUM CHLORIDE CRYS ER 20 MEQ PO TBCR: 20.0000 meq | EXTENDED_RELEASE_TABLET | Freq: Two times a day (BID) | ORAL | 0 refills | Status: DC

## 2021-05-26 NOTE — Telephone Encounter (Signed)
Baldo Ash Nche-NP has spoke with patient wife and handled this

## 2021-05-26 NOTE — Telephone Encounter (Signed)
Review of care everywhere records indicate potassium of 3.4, Cl 103, sodium 139, Creatinine 0.64. BMP was completed 05/25/21 by general surgery. Sent potassium rx Informed Ms. Tyler Richards about prescription. She requested for rx to be sent to [facility] in [place].

## 2021-05-26 NOTE — Telephone Encounter (Signed)
Tyler Richards from [facility] called back. Said if you all need to speak with her about the potassium her number is [phone]

## 2021-05-26 NOTE — Telephone Encounter (Signed)
Pt wife called to check up and is very concerned that this is taking so long.

## 2021-05-26 NOTE — Telephone Encounter (Signed)
Potassium level of 4.6

## 2021-06-09 ENCOUNTER — Telehealth: Payer: Self-pay

## 2021-06-09 NOTE — Telephone Encounter (Signed)
Transition Care Management Unsuccessful Follow-up Telephone Call  Date of discharge and from where:  06/05/21 [facility]  Attempts:  1st Attempt  Reason for unsuccessful TCM follow-up call:  Left voice message    Thea Silversmith, RN, MSN, BSN, [place] Care Management Coordinator [phone]

## 2021-06-10 ENCOUNTER — Telehealth: Payer: Self-pay

## 2021-06-10 NOTE — Telephone Encounter (Signed)
Transition Care Management Follow-up Telephone Call Date of discharge and from where: 06/05/21 [facility] How have you been since you were released from the hospital? "he is doing real good" Any questions or concerns? No  Items Reviewed: Did the pt receive and understand the discharge instructions provided? Yes  Medications obtained and verified? Yes  Other? No  Any new allergies since your discharge? No  Dietary orders reviewed? Yes Do you have support at home? Yes   Home Care and Equipment/Supplies: Were home health services ordered? yes If so, what is the name of the agency? [facility]  Has the agency set up a time to come to the patient's home? yes Were any new equipment or medical supplies ordered?  No already has supplies. What is the name of the medical supply agency? [facility] Were you able to get the supplies/equipment? Encouraged to follow up with home health agency if any additional supplies needed. Do you have any questions related to the use of the equipment or supplies? No  Functional Questionnaire: (I = Independent and D = Dependent) ADLs: D  Bathing/Dressing- D  Meal Prep- D  Eating- I  Maintaining continence- I  Transferring/Ambulation- Assistance  Managing Meds- A  Follow up appointments reviewed: PCP Hospital f/u appt confirmed? Yes  Scheduled to see Wilfred Lacy, NP on 07/20/21 @ 10:30. [facility] f/u appt confirmed? Yes  Scheduled to see Dr. Zeb Comfort on 06/14/21 @ 10:45. Are transportation arrangements needed? No  If their condition worsens, is the pt aware to call PCP or go to the Emergency Dept.? Yes Was the patient provided with contact information for the PCP's office or ED? Yes Was to pt encouraged to call back with questions or concerns? Yes

## 2021-06-14 ENCOUNTER — Telehealth: Payer: Self-pay | Admitting: Nurse Practitioner

## 2021-06-16 ENCOUNTER — Telehealth: Payer: Self-pay | Admitting: Nurse Practitioner

## 2021-06-17 ENCOUNTER — Telehealth: Payer: Self-pay | Admitting: Nurse Practitioner

## 2021-06-18 NOTE — Telephone Encounter (Signed)
No return phone number attached to call them back. Dm/cma

## 2021-07-07 ENCOUNTER — Telehealth: Payer: Self-pay | Admitting: Nurse Practitioner

## 2021-07-07 NOTE — Telephone Encounter (Signed)
Mark from [facility]) is needing verbal orders for pt to have his PT extended to  1time a week for 1 week starting on 07/18/21. Please advise [name] at [phone], you can leave a detailed message.

## 2021-07-09 NOTE — Telephone Encounter (Signed)
Verbal orders given  

## 2021-07-13 ENCOUNTER — Telehealth (HOSPITAL_COMMUNITY): Payer: Self-pay

## 2021-07-13 NOTE — Telephone Encounter (Signed)
Called to schedule US carotid. Wife will speak with PCP and call back if they decide to schedule. AW

## 2021-07-15 ENCOUNTER — Telehealth: Payer: Self-pay | Admitting: Nurse Practitioner

## 2021-07-15 NOTE — Telephone Encounter (Signed)
Moira, Occupational Therapist is requesting verbal orders for pt to have therapy 2x a week for 8 weeks.  CB- [phone]

## 2021-07-15 NOTE — Telephone Encounter (Signed)
Cara from [facility] called to request verbal orders to continue nursing 1x week for 2 more weeks.   CB : [phone]  OK to leave msg on voicemail.

## 2021-07-16 ENCOUNTER — Telehealth: Payer: Self-pay | Admitting: Nurse Practitioner

## 2021-07-16 NOTE — Telephone Encounter (Signed)
Verbal orders given for both 

## 2021-07-16 NOTE — Telephone Encounter (Signed)
Tyler Richards from [facility] is needing verbal orders for PT  2times a week for 4 weeks, 1time a week for 4 weeks starting on 07/20/21. Please advise [name] at [phone] can leave a vm.

## 2021-07-20 ENCOUNTER — Encounter: Payer: Self-pay | Admitting: Nurse Practitioner

## 2021-07-20 ENCOUNTER — Ambulatory Visit (INDEPENDENT_AMBULATORY_CARE_PROVIDER_SITE_OTHER): Payer: Medicare Other | Admitting: Nurse Practitioner

## 2021-07-20 ENCOUNTER — Other Ambulatory Visit: Payer: Self-pay

## 2021-07-20 VITALS — BP 118/62 | HR 85 | Temp 97.8°F | Resp 18 | Wt 144.0 lb

## 2021-07-20 DIAGNOSIS — E876 Hypokalemia: Secondary | ICD-10-CM

## 2021-07-20 DIAGNOSIS — M109 Gout, unspecified: Secondary | ICD-10-CM

## 2021-07-20 DIAGNOSIS — I10 Essential (primary) hypertension: Secondary | ICD-10-CM

## 2021-07-20 DIAGNOSIS — Z7952 Long term (current) use of systemic steroids: Secondary | ICD-10-CM | POA: Diagnosis not present

## 2021-07-20 DIAGNOSIS — R739 Hyperglycemia, unspecified: Secondary | ICD-10-CM | POA: Diagnosis not present

## 2021-07-20 DIAGNOSIS — Z23 Encounter for immunization: Secondary | ICD-10-CM | POA: Diagnosis not present

## 2021-07-20 DIAGNOSIS — Z9889 Other specified postprocedural states: Secondary | ICD-10-CM | POA: Insufficient documentation

## 2021-07-20 DIAGNOSIS — K257 Chronic gastric ulcer without hemorrhage or perforation: Secondary | ICD-10-CM

## 2021-07-20 LAB — BASIC METABOLIC PANEL
BUN: 12 mg/dL (ref 6–23)
CO2: 34 mEq/L — ABNORMAL HIGH (ref 19–32)
Calcium: 9.5 mg/dL (ref 8.4–10.5)
Chloride: 99 mEq/L (ref 96–112)
Creatinine, Ser: 0.6 mg/dL (ref 0.40–1.50)
GFR: 97.28 mL/min (ref >60.00)
Glucose, Bld: 115 mg/dL — ABNORMAL HIGH (ref 70–99)
Potassium: 2.9 mEq/L — ABNORMAL LOW (ref 3.5–5.1)
Sodium: 141 mEq/L (ref 135–145)

## 2021-07-20 LAB — URIC ACID: Uric Acid, Serum: 3.7 mg/dL — ABNORMAL LOW (ref 4.0–7.8)

## 2021-07-20 LAB — HEMOGLOBIN A1C: Hgb A1c MFr Bld: 4.4 % — ABNORMAL LOW (ref 4.6–6.5)

## 2021-07-20 MED ORDER — PANTOPRAZOLE SODIUM 20 MG PO TBEC: 20.0000 mg | DELAYED_RELEASE_TABLET | Freq: Every day | ORAL | 1 refills | Status: DC

## 2021-07-20 NOTE — Assessment & Plan Note (Signed)
Repeat uric acid and BMP Maintain allopurinol at 100mg 

## 2021-07-20 NOTE — Assessment & Plan Note (Addendum)
Secondary of use of florinef Repeat hgbA1c: normal

## 2021-07-20 NOTE — Progress Notes (Signed)
Subjective:  Patient ID: Tyler Richards, male    DOB: [birth year]  Age: 70 y.o. MRN: [id number]  CC: Follow-up (3 month follow up weight loss HTN hyperlipidema, labs, wife would like to know if he should take potassium supplement to keep potassium up, because it runs low. 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 Seago  Hypertension Stable BP No syncopal episodes with pacemaker in place. BP Readings from Last 3 Encounters:  07/20/21 118/62  04/19/21 122/80  03/19/21 100/60   Continue florinef and midodrine Repeat BMP  Polyarticular gout Repeat uric acid and BMP Maintain allopurinol at 100mg   Hyperglycemia Secondary of use of florinef Repeat hgbA1c: normal  Hypokalemia Chronic daily potassium 24meq prescribed  Wt Readings from Last 3 Encounters:  07/20/21 144 lb (65.3 kg)  04/19/21 129 lb 11.2 oz (58.8 kg)  12/22/20 157 lb 12.8 oz (71.6 kg)    Reviewed past Medical, Social and Family history today.  Outpatient Medications Prior to Visit  Medication Sig Dispense Refill   acetaminophen (TYLENOL) 325 MG tablet Take 650 mg by mouth every 6 (six) hours as needed for mild pain or headache.     allopurinol (ZYLOPRIM) 100 MG tablet Take 1 tablet (100 mg total) by mouth daily. 90 tablet 3   atorvastatin (LIPITOR) 40 MG tablet Take 1 tablet (40 mg total) by mouth daily. 90 tablet 3   dibucaine (NUPERCAINAL) 1 % ointment Apply topically as needed. Apply to affected area 30 g 0   dicyclomine (BENTYL) 20 MG tablet Take by mouth.     fludrocortisone (FLORINEF) 0.1 MG tablet Take 1.5 tablets (0.15 mg total) by mouth 2 (two) times daily. 252 tablet 3   gabapentin (NEURONTIN) 300 MG capsule Take 1 capsule (300 mg total) by mouth 3 (three) times daily. 270 capsule 3   midodrine (PROAMATINE) 10 MG tablet Take 1 tablet (10 mg total) by mouth 2 (two) times daily with a meal. 180 tablet 3   pantoprazole (PROTONIX) 20 MG tablet Take 1  tablet (20 mg total) by mouth daily. 90 tablet 1   potassium chloride SA (KLOR-CON) 20 MEQ tablet Take 1 tablet (20 mEq total) by mouth 2 (two) times daily. 10 tablet 0   HYDROcodone-acetaminophen (NORCO/VICODIN) 5-325 MG tablet Take 0.5 tablets by mouth every 6 (six) hours as needed.     No facility-administered medications prior to visit.    ROS See HPI  Objective:  BP 118/62 (BP Location: Right Arm, Patient Position: Sitting, Cuff Size: Normal)   Pulse 85   Temp 97.8 F (36.6 C) (Temporal)   Resp 18   Wt 144 lb (65.3 kg)   SpO2 99%   BMI 19.53 kg/m   Physical Exam Cardiovascular:     Rate and Rhythm: Normal rate and regular rhythm.     Pulses: Normal pulses.     Heart sounds: Normal heart sounds.  Pulmonary:     Effort: Pulmonary effort is normal.     Breath sounds: Normal breath sounds.  Abdominal:     General: Bowel sounds are normal. There is no distension.     Palpations: Abdomen is soft.     Tenderness: There is no abdominal tenderness.  Musculoskeletal:     Right lower leg: No edema.     Left lower leg: No edema.  Neurological:     Mental Status: He is alert and oriented to person, place, and time.  Psychiatric:  Mood and Affect: Mood normal.        Behavior: Behavior normal.   Assessment & Plan:  This visit occurred during the SARS-CoV-2 public health emergency.  Safety protocols were in place, including screening questions prior to the visit, additional usage of staff PPE, and extensive cleaning of exam room while observing appropriate contact time as indicated for disinfecting solutions.   Viliami was seen today for follow-up.  Diagnoses and all orders for this visit:  Primary hypertension -     Basic metabolic panel  Flu vaccine need -     Flu Vaccine QUAD High Dose(Fluad)  Hypokalemia -     Basic metabolic panel -     potassium chloride SA (KLOR-CON) 20 MEQ tablet; 69mEq BID x 1week, then 7mEq daily continuously  Long-term corticosteroid  use -     Hemoglobin A1c  Hyperglycemia -     Hemoglobin A1c  Polyarticular gout -     Basic metabolic panel -     Uric acid  Chronic gastric ulcer without hemorrhage and without perforation -     pantoprazole (PROTONIX) 20 MG tablet; Take 1 tablet (20 mg total) by mouth daily.  Problem List Items Addressed This Visit       Cardiovascular and Mediastinum   Hypertension - Primary    Stable BP No syncopal episodes with pacemaker in place. BP Readings from Last 3 Encounters:  07/20/21 118/62  04/19/21 122/80  03/19/21 100/60   Continue florinef and midodrine Repeat BMP      Relevant Orders   Basic metabolic panel (Completed)     Musculoskeletal and Integument   Polyarticular gout    Repeat uric acid and BMP Maintain allopurinol at 100mg       Relevant Medications   HYDROcodone-acetaminophen (NORCO/VICODIN) 5-325 MG tablet   Other Relevant Orders   Basic metabolic panel (Completed)   Uric acid (Completed)     Other   Hyperglycemia    Secondary of use of florinef Repeat hgbA1c: normal      Relevant Orders   Hemoglobin A1c (Completed)   Hypokalemia    Chronic daily potassium 47meq prescribed      Relevant Medications   potassium chloride SA (KLOR-CON) 20 MEQ tablet   Other Relevant Orders   Basic metabolic panel (Completed)   Long-term corticosteroid use   Relevant Orders   Hemoglobin A1c (Completed)   Other Visit Diagnoses     Flu vaccine need       Relevant Orders   Flu Vaccine QUAD High Dose(Fluad) (Completed)   Chronic gastric ulcer without hemorrhage and without perforation       Relevant Medications   pantoprazole (PROTONIX) 20 MG tablet       Follow-up: Return in about 3 months (around 10/20/2021) for HTN and hyperglycemia.  Wilfred Lacy, NP

## 2021-07-20 NOTE — Telephone Encounter (Signed)
Verbal orders given on 07/16/21 per that encounter.

## 2021-07-20 NOTE — Assessment & Plan Note (Signed)
Stable BP No syncopal episodes with pacemaker in place. BP Readings from Last 3 Encounters:  07/20/21 118/62  04/19/21 122/80  03/19/21 100/60   Continue florinef and midodrine Repeat BMP

## 2021-07-20 NOTE — Patient Instructions (Addendum)
Go to lab for blood draw. Ok to get 3rd COVD vaccine, then wait 2weeks to get TDAP and zoster vaccine at the retail pharmacy.

## 2021-07-21 DIAGNOSIS — E876 Hypokalemia: Secondary | ICD-10-CM | POA: Insufficient documentation

## 2021-07-21 MED ORDER — POTASSIUM CHLORIDE CRYS ER 20 MEQ PO TBCR: EXTENDED_RELEASE_TABLET | ORAL | 5 refills | Status: DC

## 2021-07-21 NOTE — Assessment & Plan Note (Signed)
Chronic daily potassium 24meq prescribed

## 2021-08-02 DIAGNOSIS — Z8673 Personal history of transient ischemic attack (TIA), and cerebral infarction without residual deficits: Secondary | ICD-10-CM | POA: Insufficient documentation

## 2021-08-02 DIAGNOSIS — I2510 Atherosclerotic heart disease of native coronary artery without angina pectoris: Secondary | ICD-10-CM | POA: Insufficient documentation

## 2021-08-10 ENCOUNTER — Encounter: Payer: Self-pay | Admitting: Nurse Practitioner

## 2021-08-10 ENCOUNTER — Other Ambulatory Visit: Payer: Self-pay

## 2021-08-10 ENCOUNTER — Ambulatory Visit (INDEPENDENT_AMBULATORY_CARE_PROVIDER_SITE_OTHER): Payer: Medicare Other | Admitting: Nurse Practitioner

## 2021-08-10 VITALS — BP 118/60 | HR 62 | Temp 97.2°F | Wt 143.4 lb

## 2021-08-10 DIAGNOSIS — D508 Other iron deficiency anemias: Secondary | ICD-10-CM

## 2021-08-10 DIAGNOSIS — K254 Chronic or unspecified gastric ulcer with hemorrhage: Secondary | ICD-10-CM

## 2021-08-10 DIAGNOSIS — G459 Transient cerebral ischemic attack, unspecified: Secondary | ICD-10-CM

## 2021-08-10 LAB — CBC
HCT: 37.9 % — ABNORMAL LOW (ref 39.0–52.0)
Hemoglobin: 12.5 g/dL — ABNORMAL LOW (ref 13.0–17.0)
MCHC: 32.9 g/dL (ref 30.0–36.0)
MCV: 102.9 fl — ABNORMAL HIGH (ref 78.0–100.0)
Platelets: 261 10*3/uL (ref 150.0–400.0)
RBC: 3.69 Mil/uL — ABNORMAL LOW (ref 4.22–5.81)
RDW: 13 % (ref 11.5–15.5)
WBC: 4.8 10*3/uL (ref 4.0–10.5)

## 2021-08-10 NOTE — Patient Instructions (Addendum)
Go to lab for blood draw  Return stool kit to lab once a week x 3.  Maintain appt with neurology.

## 2021-08-10 NOTE — Progress Notes (Signed)
Subjective:  Patient ID: Tyler Richards, male    DOB: October 03, 1950  Age: 70 y.o. MRN: [id number]  CC: Hospitalization Follow-up [facility] follow up due to TIA. Pt states he has been doing fine since leaving the hospital. )  HPI Accompanied by wife TIA (transient ischemic attack) On 08/01/21, Tyler Richards was taken to [facility] ED due to syncopal episode and altered mental status. This was witnessed by his wife. Syncopal episode lasted <7min and altered mental status lasted about 38mins and associated with urinary incontinence. Upon arrival to ED, he was alert and oriented x 4. Reviewed radiology reports (CT head, CTA head and neck, MRI) and lab results: nor acute findings. Only medication change was addition of ASA $Remo'81mg'JIMLC$ . Today Tyler Richards and his wife declined any acute compliant. He has an appt with [facility] at 3pm today. They deny any melena or hematochezia or ABD pain.  Due to hx of GI bleed and anemia, will check fecal occult, cbc and iron panel.   Reconciled medication list and reviewed ED discharge summary.  Wt Readings from Last 3 Encounters:  08/10/21 143 lb 6.4 oz (65 kg)  07/20/21 144 lb (65.3 kg)  04/19/21 129 lb 11.2 oz (58.8 kg)    Reviewed past Medical, Social and Family history today.  Outpatient Medications Prior to Visit  Medication Sig Dispense Refill   acetaminophen (TYLENOL) 325 MG tablet Take 650 mg by mouth every 6 (six) hours as needed for mild pain or headache.     allopurinol (ZYLOPRIM) 100 MG tablet Take 1 tablet (100 mg total) by mouth daily. 90 tablet 3   ASPIRIN ADULT LOW STRENGTH 81 MG EC tablet Take 81 mg by mouth daily.     atorvastatin (LIPITOR) 40 MG tablet Take 1 tablet (40 mg total) by mouth daily. 90 tablet 3   dibucaine (NUPERCAINAL) 1 % ointment Apply topically as needed. Apply to affected area 30 g 0   dicyclomine (BENTYL) 20 MG tablet Take by mouth.     fludrocortisone (FLORINEF) 0.1 MG tablet Take 1.5 tablets (0.15 mg total) by mouth  2 (two) times daily. 252 tablet 3   gabapentin (NEURONTIN) 300 MG capsule Take 1 capsule (300 mg total) by mouth 3 (three) times daily. 270 capsule 3   HYDROcodone-acetaminophen (NORCO/VICODIN) 5-325 MG tablet Take 0.5 tablets by mouth every 6 (six) hours as needed.     midodrine (PROAMATINE) 10 MG tablet Take 1 tablet (10 mg total) by mouth 2 (two) times daily with a meal. 180 tablet 3   pantoprazole (PROTONIX) 20 MG tablet Take 1 tablet (20 mg total) by mouth daily. 90 tablet 1   potassium chloride SA (KLOR-CON) 20 MEQ tablet 69mEq BID x 1week, then 56mEq daily continuously 35 tablet 5   No facility-administered medications prior to visit.    ROS See HPI  Objective:  BP 118/60 (BP Location: Right Arm, Patient Position: Sitting, Cuff Size: Normal)   Pulse 62   Temp (!) 97.2 F (36.2 C) (Temporal)   Wt 143 lb 6.4 oz (65 kg)   SpO2 99%   BMI 19.45 kg/m   Physical Exam Eyes:     General: No visual field deficit.    Extraocular Movements: Extraocular movements intact.     Conjunctiva/sclera: Conjunctivae normal.     Pupils: Pupils are equal, round, and reactive to light.  Cardiovascular:     Rate and Rhythm: Normal rate and regular rhythm.     Pulses: Normal pulses.  Heart sounds: Normal heart sounds.  Pulmonary:     Effort: Pulmonary effort is normal.     Breath sounds: Normal breath sounds.  Musculoskeletal:     Cervical back: Normal range of motion and neck supple.  Neurological:     Mental Status: He is alert and oriented to person, place, and time.     Cranial Nerves: Facial asymmetry present. No dysarthria.     Motor: Weakness and atrophy present. No tremor.     Gait: Gait abnormal.     Comments: Left UE and LE weakness. Muscle contraction and atrophy in left forearm and hand (not acute)  Psychiatric:        Mood and Affect: Mood normal.        Behavior: Behavior normal.        Thought Content: Thought content normal.   Assessment & Plan:  This visit occurred  during the SARS-CoV-2 public health emergency.  Safety protocols were in place, including screening questions prior to the visit, additional usage of staff PPE, and extensive cleaning of exam room while observing appropriate contact time as indicated for disinfecting solutions.   Tyler Richards was seen today for hospitalization follow-up.  Diagnoses and all orders for this visit:  Other iron deficiency anemia -     CBC -     Iron, TIBC and Ferritin Panel -     Fecal occult blood, imunochemical; Standing  Gastric ulcer with hemorrhage, unspecified chronicity  TIA (transient ischemic attack)  Return stool kit to lab once a week x 3. Maintain appt with neurology.  Problem List Items Addressed This Visit       Other   Iron deficiency anemia - Primary   Relevant Orders   CBC   Iron, TIBC and Ferritin Panel   Fecal occult blood, imunochemical   Other Visit Diagnoses     Gastric ulcer with hemorrhage, unspecified chronicity       Relevant Medications   ASPIRIN ADULT LOW STRENGTH 81 MG EC tablet   TIA (transient ischemic attack)       Relevant Medications   ASPIRIN ADULT LOW STRENGTH 81 MG EC tablet       Follow-up: Return for maintain upcoming appt in January.  Wilfred Lacy, NP

## 2021-08-11 ENCOUNTER — Other Ambulatory Visit: Payer: Medicare Other

## 2021-08-11 DIAGNOSIS — G8110 Spastic hemiplegia affecting unspecified side: Secondary | ICD-10-CM | POA: Insufficient documentation

## 2021-08-11 DIAGNOSIS — G8114 Spastic hemiplegia affecting left nondominant side: Secondary | ICD-10-CM | POA: Insufficient documentation

## 2021-08-11 DIAGNOSIS — R4189 Other symptoms and signs involving cognitive functions and awareness: Secondary | ICD-10-CM | POA: Insufficient documentation

## 2021-08-11 LAB — IRON,TIBC AND FERRITIN PANEL
%SAT: 21 % (calc) (ref 20–48)
Ferritin: 22 ng/mL — ABNORMAL LOW (ref 24–380)
Iron: 70 ug/dL (ref 50–180)
TIBC: 339 mcg/dL (calc) (ref 250–425)

## 2021-08-11 NOTE — Progress Notes (Signed)
Per the orders of NP charlotte Nche pt is here to drop off IFOB. 1 of 3. Pt received second IFOB today. Verbalized understanding on instructions on when to complete second test.

## 2021-08-15 ENCOUNTER — Encounter: Payer: Self-pay | Admitting: Nurse Practitioner

## 2021-08-15 DIAGNOSIS — G459 Transient cerebral ischemic attack, unspecified: Secondary | ICD-10-CM

## 2021-08-15 HISTORY — DX: Transient cerebral ischemic attack, unspecified: G45.9

## 2021-08-15 NOTE — Assessment & Plan Note (Signed)
On 08/01/21, Mr. Haliburton was taken to [facility] ED due to syncopal episode and altered mental status. This was witnessed by his wife. Syncopal episode lasted <41min and altered mental status lasted about 60mins and associated with urinary incontinence. Upon arrival to ED, he was alert and oriented x 4. Reviewed radiology reports (CT head, CTA head and neck, MRI) and lab results: nor acute findings. Only medication change was addition of ASA 81mg . Today Mr. Balestrieri and his wife declined any acute compliant. He has an appt with [facility] at 3pm today. They deny any melena or hematochezia or ABD pain.  Due to hx of GI bleed and anemia, will check fecal occult, cbc and iron panel.

## 2021-08-17 ENCOUNTER — Telehealth: Payer: Self-pay | Admitting: Nurse Practitioner

## 2021-08-17 NOTE — Telephone Encounter (Signed)
Pt wife called and asked if she could get a call back about lab results and said you can just leave a detailed voicemail about the results

## 2021-08-18 NOTE — Telephone Encounter (Signed)
Patient notified and verbalized understanding. 

## 2021-08-23 ENCOUNTER — Other Ambulatory Visit: Payer: Medicare Other

## 2021-08-23 ENCOUNTER — Other Ambulatory Visit: Payer: Self-pay

## 2021-08-24 ENCOUNTER — Telehealth: Payer: Self-pay

## 2021-08-24 ENCOUNTER — Other Ambulatory Visit (INDEPENDENT_AMBULATORY_CARE_PROVIDER_SITE_OTHER): Payer: Medicare Other

## 2021-08-24 DIAGNOSIS — D508 Other iron deficiency anemias: Secondary | ICD-10-CM | POA: Diagnosis not present

## 2021-08-24 DIAGNOSIS — D500 Iron deficiency anemia secondary to blood loss (chronic): Secondary | ICD-10-CM

## 2021-08-24 DIAGNOSIS — R195 Other fecal abnormalities: Secondary | ICD-10-CM

## 2021-08-24 LAB — FECAL OCCULT BLOOD, IMMUNOCHEMICAL: Fecal Occult Bld: POSITIVE — AB

## 2021-08-24 NOTE — Telephone Encounter (Signed)
[facility] called to report a positive IFOB result. Message relayed to [place]

## 2021-08-25 MED ORDER — FERROUS SULFATE 324 (65 FE) MG PO TBEC: 1.0000 | DELAYED_RELEASE_TABLET | Freq: Every day | ORAL | 1 refills | Status: DC

## 2021-08-25 NOTE — Telephone Encounter (Signed)
Positive blood in stool. Need to resume ferrous sulfate 324mg  1tab daily with food. Also needs to schedule f/up appt with GI. Entered referral Remind to pick up bag at front desk if his wife has not already done do.

## 2021-08-26 NOTE — Telephone Encounter (Signed)
Pt and wife notified and verbalized understanding.

## 2021-09-13 ENCOUNTER — Other Ambulatory Visit: Payer: Self-pay

## 2021-09-13 ENCOUNTER — Encounter: Payer: Self-pay | Admitting: Nurse Practitioner

## 2021-09-13 ENCOUNTER — Ambulatory Visit (INDEPENDENT_AMBULATORY_CARE_PROVIDER_SITE_OTHER): Payer: Medicare Other | Admitting: Nurse Practitioner

## 2021-09-13 VITALS — BP 106/80 | HR 64 | Temp 97.0°F | Ht 72.0 in | Wt 140.2 lb

## 2021-09-13 DIAGNOSIS — G903 Multi-system degeneration of the autonomic nervous system: Secondary | ICD-10-CM | POA: Diagnosis not present

## 2021-09-13 DIAGNOSIS — L819 Disorder of pigmentation, unspecified: Secondary | ICD-10-CM | POA: Diagnosis not present

## 2021-09-13 DIAGNOSIS — D500 Iron deficiency anemia secondary to blood loss (chronic): Secondary | ICD-10-CM

## 2021-09-13 NOTE — Patient Instructions (Signed)
Take 1tab of midodrine and 1tab of  florinef if  BP <100/60. Maintain upcoming appt with oncology and neurology.

## 2021-09-13 NOTE — Progress Notes (Signed)
Subjective:  Patient ID: Tyler Richards, male    DOB: 11-27-1950  Age: 70 y.o. MRN: [id number]  CC: Hospitalization Follow-up (Pt was in hospital due to signs of a stroke. Pt wife said since leaving the hospital they stopped taking he BP medication because it was elevating it to high. Pt BP has been normal since stopping medication. /Tdap and Shingles vaccine to be given at pharmacy per insurance. )  HPI Accompanied by wife Tyler Richards was evaluated in the ED on 12/07 and 09/04/21 due to altered mental status. This was witnessed by his wife.Ct and MRI did not show any acute finding. Lab results were stable as well. After hospital discharge, Tyler Richards noted Midodrine and florinef held due to elevated BP (200/100) since last ED visit. She was instructed by Home PT to hold Midodrine and florinef. BP has been normal since discontinuation of above medications: She monitor BP every AM and PM: 110s/70s-120s/80s. She also denies any altered mental status, change in GI/GU function, melena or  hematochezia Has upcoming appt with neurology and oncology, unknown dates at this time   BP Readings from Last 3 Encounters:  09/13/21 106/80  08/10/21 118/60  07/20/21 118/62    Wt Readings from Last 3 Encounters:  09/13/21 140 lb 3.2 oz (63.6 kg)  08/10/21 143 lb 6.4 oz (65 kg)  07/20/21 144 lb (65.3 kg)    Reviewed past Medical, Social and Family history today.  Outpatient Medications Prior to Visit  Medication Sig Dispense Refill   acetaminophen (TYLENOL) 325 MG tablet Take 650 mg by mouth every 6 (six) hours as needed for mild pain or headache.     allopurinol (ZYLOPRIM) 100 MG tablet Take 1 tablet (100 mg total) by mouth daily. 90 tablet 3   ASPIRIN ADULT LOW STRENGTH 81 MG EC tablet Take 81 mg by mouth daily.     dibucaine (NUPERCAINAL) 1 % ointment Apply topically as needed. Apply to affected area 30 g 0   dicyclomine (BENTYL) 20 MG tablet Take by mouth.     ferrous sulfate 324 (65 Fe) MG TBEC Take 1  tablet (325 mg total) by mouth daily after breakfast. 90 tablet 1   gabapentin (NEURONTIN) 300 MG capsule Take 1 capsule (300 mg total) by mouth 3 (three) times daily. 270 capsule 3   HYDROcodone-acetaminophen (NORCO/VICODIN) 5-325 MG tablet Take 0.5 tablets by mouth every 6 (six) hours as needed.     pantoprazole (PROTONIX) 20 MG tablet Take 1 tablet (20 mg total) by mouth daily. 90 tablet 1   potassium chloride SA (KLOR-CON) 20 MEQ tablet 76mEq BID x 1week, then 61mEq daily continuously 35 tablet 5   atorvastatin (LIPITOR) 40 MG tablet Take 1 tablet (40 mg total) by mouth daily. 90 tablet 3   fludrocortisone (FLORINEF) 0.1 MG tablet Take 1 tablet (0.1 mg total) by mouth daily as needed. If BP <100/60 252 tablet 3   midodrine (PROAMATINE) 10 MG tablet Take 1 tablet (10 mg total) by mouth daily as needed. If BP<100/60 180 tablet 3   fludrocortisone (FLORINEF) 0.1 MG tablet Take 1.5 tablets (0.15 mg total) by mouth 2 (two) times daily. (Patient not taking: Reported on 09/13/2021) 252 tablet 3   midodrine (PROAMATINE) 10 MG tablet Take 1 tablet (10 mg total) by mouth 2 (two) times daily with a meal. (Patient not taking: Reported on 09/13/2021) 180 tablet 3   No facility-administered medications prior to visit.    ROS See HPI  Objective:  BP 106/80 (  BP Location: Right Arm, Patient Position: Sitting, Cuff Size: Normal)    Pulse 64    Temp (!) 97 F (36.1 C) (Temporal)    Ht 6' (1.829 m)    Wt 140 lb 3.2 oz (63.6 kg)    SpO2 98%    BMI 19.01 kg/m   Physical Exam Constitutional:      General: He is not in acute distress. HENT:     Head:      Comments: Scaly lesion on right side of neck and behind left earlobe (irregular, partially hyperpigmented). Cardiovascular:     Rate and Rhythm: Normal rate and regular rhythm.     Pulses: Normal pulses.     Heart sounds: Normal heart sounds.  Pulmonary:     Effort: Pulmonary effort is normal.     Breath sounds: Normal breath sounds.   Musculoskeletal:     Right lower leg: No edema.     Left lower leg: No edema.  Skin:    Findings: Lesion present.  Neurological:     Mental Status: He is alert and oriented to person, place, and time.   Assessment & Plan:  This visit occurred during the SARS-CoV-2 public health emergency.  Safety protocols were in place, including screening questions prior to the visit, additional usage of staff PPE, and extensive cleaning of exam room while observing appropriate contact time as indicated for disinfecting solutions.   Tyler Richards was seen today for hospitalization follow-up.  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 Maintain upcoming appt with oncology and neurology.  Problem List Items Addressed This Visit       Cardiovascular and Mediastinum   Orthostatic hypotension - Primary   Relevant Medications   fludrocortisone (FLORINEF) 0.1 MG tablet   midodrine (PROAMATINE) 10 MG tablet     Other   Iron deficiency anemia   Other Visit Diagnoses     Atypical pigmented skin lesion       Relevant Orders   Ambulatory referral to Dermatology       Follow-up: Return in about 3 months (around 12/12/2021) for HTN, hyperlipidemia.  Wilfred Lacy, NP

## 2021-09-15 ENCOUNTER — Telehealth: Payer: Self-pay | Admitting: Nurse Practitioner

## 2021-09-15 NOTE — Telephone Encounter (Signed)
[facility] with [facility] Phone number: [phone]. Needs verbal orders for home health Physical therapy 2 times a week for 4 weeks and 1 time a week for 2 weeks starting on 09/19/21. Voicemail line is secure and he said orders can be left on there

## 2021-09-16 NOTE — Telephone Encounter (Signed)
Verbal orders given  

## 2021-09-21 ENCOUNTER — Telehealth: Payer: Self-pay | Admitting: Nurse Practitioner

## 2021-09-21 DIAGNOSIS — I63511 Cerebral infarction due to unspecified occlusion or stenosis of right middle cerebral artery: Secondary | ICD-10-CM

## 2021-09-21 NOTE — Telephone Encounter (Signed)
Pt wife requesting refills on Atorvastatin 80mg  and the blood thinner, Clopidogrelbisulfate 75mg . [facility]

## 2021-09-21 NOTE — Telephone Encounter (Signed)
OT orders requested for 1 time a week for 8 weeks.   Tyler FlavorsBeatrix [phone]

## 2021-09-22 ENCOUNTER — Other Ambulatory Visit: Payer: Self-pay | Admitting: Family

## 2021-09-22 MED ORDER — ATORVASTATIN CALCIUM 40 MG PO TABS: 40.0000 mg | ORAL_TABLET | Freq: Every day | ORAL | 3 refills | Status: DC

## 2021-09-22 NOTE — Telephone Encounter (Signed)
Per patient the Clopidogrelbisulfate was prescribed by the ED doctor and pt is in need of refills and does not want to miss a dose because it is a blood thinner and his wife is afraid of not continuing it.

## 2021-09-23 NOTE — Telephone Encounter (Signed)
Verbal orders given  

## 2021-09-23 NOTE — Telephone Encounter (Signed)
Can we fill the Clopidogrelbisulfate 75mg  for patient?

## 2021-09-24 ENCOUNTER — Telehealth: Payer: Self-pay | Admitting: Nurse Practitioner

## 2021-09-24 ENCOUNTER — Other Ambulatory Visit: Payer: Self-pay | Admitting: Family

## 2021-09-24 DIAGNOSIS — I63511 Cerebral infarction due to unspecified occlusion or stenosis of right middle cerebral artery: Secondary | ICD-10-CM

## 2021-09-24 MED ORDER — CLOPIDOGREL BISULFATE 75 MG PO TABS: 75.0000 mg | ORAL_TABLET | Freq: Every day | ORAL | 1 refills | Status: DC

## 2021-09-24 MED ORDER — ATORVASTATIN CALCIUM 80 MG PO TABS: 40.0000 mg | ORAL_TABLET | Freq: Every day | ORAL | 1 refills | Status: DC

## 2021-09-24 NOTE — Telephone Encounter (Signed)
Patient notified and verbalized understanding. 

## 2021-09-24 NOTE — Telephone Encounter (Signed)
Duplicate  Closing this encounter, has already been addressed in other encounter.

## 2021-09-24 NOTE — Telephone Encounter (Signed)
Pt wife called to f/u on med requests.   12/28 atrovastatin 40mg  was sent in but she said the dose was changed to 80mg  by the ED doctor. She is asking that we send for the new dose.   Plavix approved per below msg. Can this please be sent in asap.   Pharmacy: [facility] [place] - [id number] Phone:  [phone]  Fax:  [phone]

## 2021-10-19 ENCOUNTER — Telehealth: Payer: Self-pay | Admitting: Nurse Practitioner

## 2021-10-19 NOTE — Telephone Encounter (Signed)
Verbal orders given  

## 2021-10-19 NOTE — Telephone Encounter (Signed)
Tyler Richards is calling from [facility] needing verbal order for--Pt   1time for 2weeks starting on 10/31/21. Please give him a call at [phone].

## 2021-10-21 ENCOUNTER — Ambulatory Visit: Payer: Medicare Other | Admitting: Nurse Practitioner

## 2021-11-16 ENCOUNTER — Telehealth: Payer: Self-pay | Admitting: Nurse Practitioner

## 2021-11-16 NOTE — Telephone Encounter (Signed)
Verbal orders given  

## 2021-11-16 NOTE — Telephone Encounter (Signed)
Mark with [facility] [phone]. He called to get orders for patient, Home health PT 1week 1  11/14/21 for discharge today 11/16/21. He said he was planning on discharging pt Friday but they were unavailable. He apologized for the late notice but said he does need order today please

## 2021-11-18 ENCOUNTER — Telehealth: Payer: Self-pay | Admitting: Nurse Practitioner

## 2021-11-18 DIAGNOSIS — Z8673 Personal history of transient ischemic attack (TIA), and cerebral infarction without residual deficits: Secondary | ICD-10-CM

## 2021-11-18 NOTE — Telephone Encounter (Signed)
Mark Pt with [facility] saw the pt and said that pt needs to go to outpatient [facility] in [facility] [phone]. And that they need orders sent. [facility] phone number: [phone]

## 2021-12-06 NOTE — Telephone Encounter (Signed)
Pt recently seen in ED. LVM for wife to call so we can follow up and see what is and will be needed for the patient.  ?

## 2021-12-06 NOTE — Telephone Encounter (Signed)
Per patient wife he is okay, went to the ED to have foley removed and he she does want the outpatient pt which a verbal order has already been given.  ?

## 2021-12-08 NOTE — Telephone Encounter (Signed)
wife for Tyler Richards [phone] asking about referral for outpt at [facility]. I see a note but not a referral.  ? ?Please call back at [phone] ?

## 2021-12-09 NOTE — Telephone Encounter (Signed)
Referral placed.

## 2021-12-09 NOTE — Addendum Note (Signed)
Addended by: Renette Butters on: 12/09/2021 12:12 PM ? ? Modules accepted: Orders ? ?

## 2021-12-14 ENCOUNTER — Encounter: Payer: Self-pay | Admitting: Nurse Practitioner

## 2021-12-14 ENCOUNTER — Ambulatory Visit (INDEPENDENT_AMBULATORY_CARE_PROVIDER_SITE_OTHER): Payer: Medicare Other | Admitting: Nurse Practitioner

## 2021-12-14 ENCOUNTER — Other Ambulatory Visit (INDEPENDENT_AMBULATORY_CARE_PROVIDER_SITE_OTHER): Payer: Medicare Other

## 2021-12-14 ENCOUNTER — Other Ambulatory Visit: Payer: Self-pay

## 2021-12-14 VITALS — BP 100/60 | HR 68 | Temp 97.0°F | Ht 72.0 in | Wt 140.8 lb

## 2021-12-14 DIAGNOSIS — R195 Other fecal abnormalities: Secondary | ICD-10-CM

## 2021-12-14 DIAGNOSIS — E876 Hypokalemia: Secondary | ICD-10-CM

## 2021-12-14 DIAGNOSIS — K257 Chronic gastric ulcer without hemorrhage or perforation: Secondary | ICD-10-CM

## 2021-12-14 DIAGNOSIS — G903 Multi-system degeneration of the autonomic nervous system: Secondary | ICD-10-CM

## 2021-12-14 DIAGNOSIS — D500 Iron deficiency anemia secondary to blood loss (chronic): Secondary | ICD-10-CM | POA: Diagnosis not present

## 2021-12-14 DIAGNOSIS — M109 Gout, unspecified: Secondary | ICD-10-CM

## 2021-12-14 DIAGNOSIS — G8110 Spastic hemiplegia affecting unspecified side: Secondary | ICD-10-CM

## 2021-12-14 DIAGNOSIS — I6930 Unspecified sequelae of cerebral infarction: Secondary | ICD-10-CM

## 2021-12-14 DIAGNOSIS — F19982 Other psychoactive substance use, unspecified with psychoactive substance-induced sleep disorder: Secondary | ICD-10-CM

## 2021-12-14 LAB — CBC
HCT: 38.2 % — ABNORMAL LOW (ref 39.0–52.0)
Hemoglobin: 13.2 g/dL (ref 13.0–17.0)
MCHC: 34.6 g/dL (ref 30.0–36.0)
MCV: 99.5 fl (ref 78.0–100.0)
Platelets: 283 10*3/uL (ref 150.0–400.0)
RBC: 3.84 Mil/uL — ABNORMAL LOW (ref 4.22–5.81)
RDW: 15 % (ref 11.5–15.5)
WBC: 4.8 10*3/uL (ref 4.0–10.5)

## 2021-12-14 LAB — IBC + FERRITIN
Ferritin: 58.2 ng/mL (ref 22.0–322.0)
Iron: 100 ug/dL (ref 42–165)
Saturation Ratios: 31.6 % (ref 20.0–50.0)
TIBC: 316.4 ug/dL (ref 250.0–450.0)
Transferrin: 226 mg/dL (ref 212.0–360.0)

## 2021-12-14 LAB — BASIC METABOLIC PANEL
BUN: 18 mg/dL (ref 6–23)
CO2: 30 mEq/L (ref 19–32)
Calcium: 9.8 mg/dL (ref 8.4–10.5)
Chloride: 101 mEq/L (ref 96–112)
Creatinine, Ser: 0.77 mg/dL (ref 0.40–1.50)
GFR: 89.97 mL/min (ref >60.00)
Glucose, Bld: 125 mg/dL — ABNORMAL HIGH (ref 70–99)
Potassium: 3.6 mEq/L (ref 3.5–5.1)
Sodium: 139 mEq/L (ref 135–145)

## 2021-12-14 LAB — URIC ACID: Uric Acid, Serum: 4.3 mg/dL (ref 4.0–7.8)

## 2021-12-14 LAB — MAGNESIUM: Magnesium: 1.7 mg/dL (ref 1.5–2.5)

## 2021-12-14 IMAGING — XA IR PERCUTANEOUS ART THORMBECTOMY/INFUSION INTRACRANIAL INCLUDE D
1 series · 9 of 24 positions shown · non-contrast
Comparison: CT angiogram of the head and neck June 29, 2020.

INDICATION: New onset right gaze deviation, left hemiplegia, and left-sided
neglect.

EXAM:
1. EMERGENT LARGE VESSEL OCCLUSION THROMBOLYSIS anterior
CIRCULATION)
TECHNIQUE: Following a full explanation of the procedure along with the
potential associated complications, an informed witnessed consent
was obtained from the spouse. The risks of intracranial hemorrhage
of 10%, worsening neurological deficit, ventilator dependency, death
and inability to revascularize were all reviewed in detail with the
patient's spouse.

[Series 29: 3> · axial · 5.0mm · 0.44mm/px · z∈[-279,-109]mm · 9 of 38 slices shown]
[im 2/38]
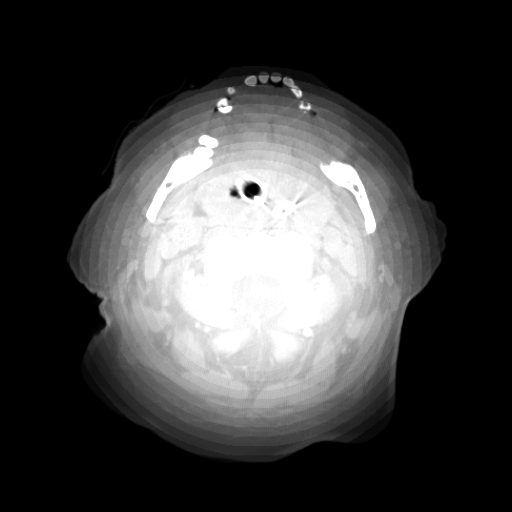
[im 7/38]
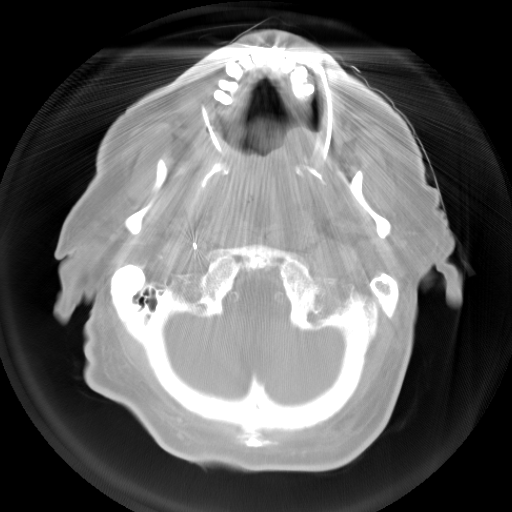
[im 10/38]
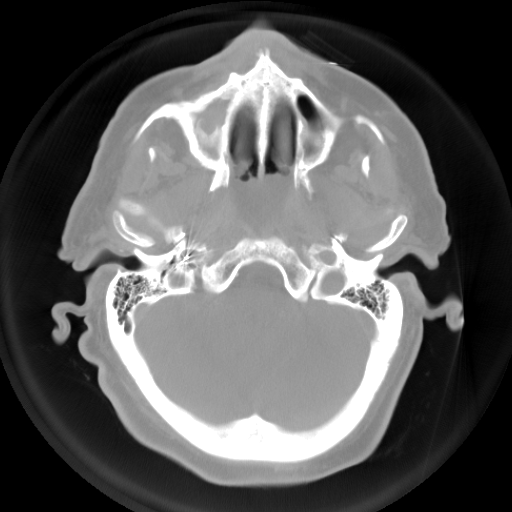
[im 15/38]
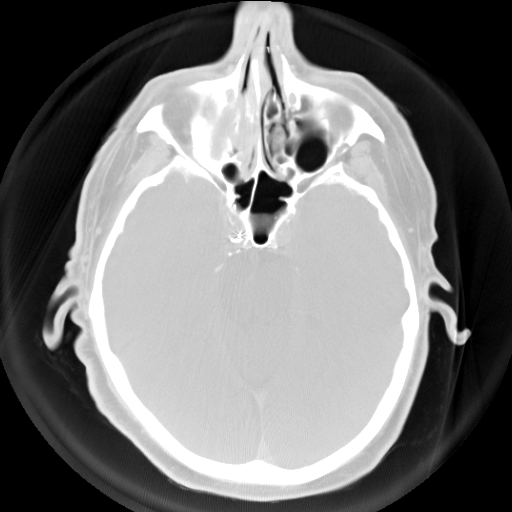
[im 20/38]
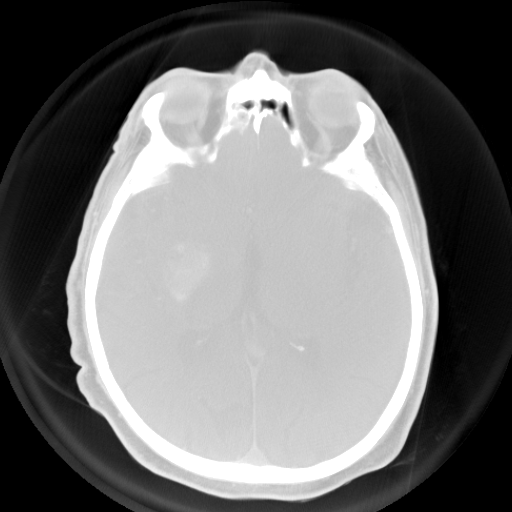
[im 23/38]
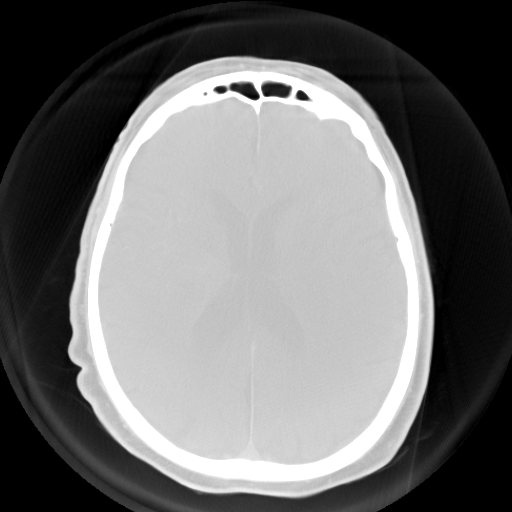
[im 28/38]
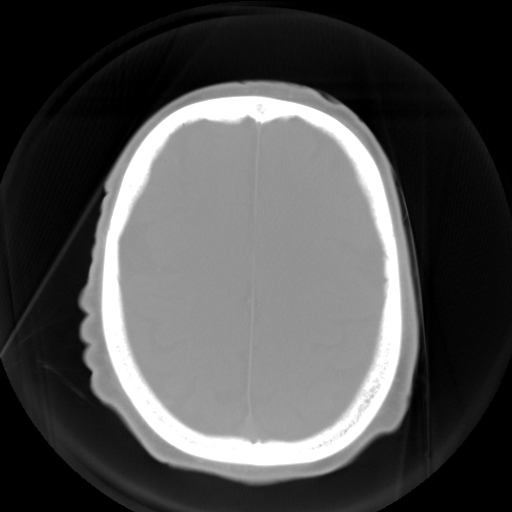
[im 33/38]
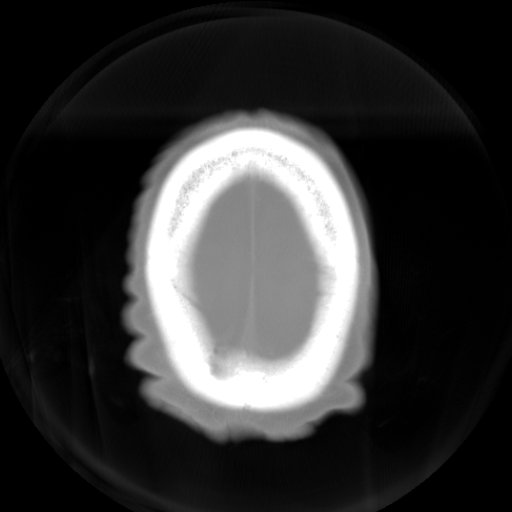
[im 36/38]
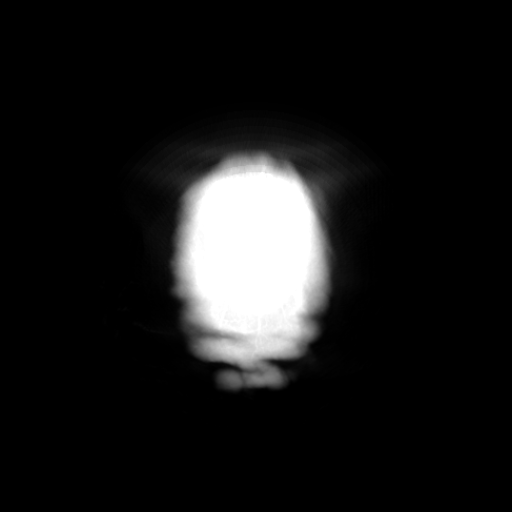

[9 of 24 positions shown; findings below may reference images not displayed]

MEDICATIONS:
Ancef 2 g IV was administered within 1 hour of the procedure.

ANESTHESIA/SEDATION:
General anesthesia

CONTRAST:  Isovue 300 approximately 165 mL

FLUOROSCOPY TIME:  Fluoroscopy Time: 97 minutes 0 seconds (7455
mGy).

COMPLICATIONS:
None immediate.
The patient was then put under general anesthesia by the [REDACTED] at [HOSPITAL].

The right groin was prepped and draped in the usual sterile fashion.
Thereafter using modified Seldinger technique, transfemoral access
into the right common femoral artery was obtained without
difficulty. Over a 0.035 inch guidewire an 8 French 25 Pinnacle
sheath was inserted. Through this, and also over a 0.035 inch
guidewire a 5 French 125 cm Tiia select inside of the balloon
guide catheter combinationwas advanced to the aortic arch region and
selectively positioned in the distal right common carotid artery.
Guidewire, and the select catheter removed. Good aspiration obtained
from the hub of the balloon guide catheter in the right common
carotid artery.

Arteriogram was then perfor[REDACTED]ed extra cranially and
intracranially.
FINDINGS: The right common carotid arteriogram demonstrates the right external
carotid artery and its major branches to be widely patent.

The right internal carotid artery at the bulb demonstrates a
moderate sized plaque associated with complete occlusion just distal
to the bulb. No evidence of distal angiographic string sign, or
reconstitution of right internal carotid artery in the cavernous
segment was noted.

PROCEDURE:
Through the balloon guide catheter, a combination of an 021 [place]
ProVue microcatheter over a 0.014 inch standard Synchro micro
guidewire with a J configuration, access through the occluded right
internal carotid artery was obtained with the micro guidewire
leading followed by the microcatheter. The combination was advanced
to the horizontal petrous segment where the micro guidewire was
removed. Poor aspiration was obtained from the microcatheter hub on
account of the extensive clot in the entire right internal carotid
artery intracranially and extra cranially.

The microcatheter was then exchanged for a 014 inch Synchro standard
300 cm exchange micro guidewire with a J-tip configuration.

A control arteriogram performed through the balloon guide
demonstrated minimally improved caliber through the proximal right
internal carotid artery.

A 4 mm x 30 mm Viatrac 14 angioplasty balloon catheter which had
been prepped with 50% contrast and 50% heparinized saline infusion
and integrated with 75% contrast and 25% heparinized saline infusion
was advanced over the exchange micro guidewire using the rapid
exchange technique. The proximal and distal markers were then
positioned in the described landing zones. A control inflation was
then performed using micro inflation syringe device via micro tubing
to 12 atmospheres. This was maintained for about 20 seconds and
retrieved proximally.

A control arteriogram performed through the balloon guide
demonstrated improved caliber in the bulb but there continued be a
high-grade stenosis in the proximal right internal carotid artery.
This prompted a second angioplasty in the manner as described above
again to 12 atmospheres for approximately 20 seconds. The balloon
was then deflated and retrieved and removed. A control arteriogram
performed through the balloon guide demonstrated significantly
improved caliber and flow through the angioplastied segment with
improved flow to the more distal right internal carotid artery.

However, there continued to be extensive clot noted within the
internal carotid artery at the cranial skull base and the
supraclinoid right ICA.

An 021 [place] ProVue catheter inside of a 071 134 cm aspiration
catheter, combination was advanced over the exchange micro guidewire
to the cavernous segment of the right internal carotid artery.

Exchange micro guidewire was replaced with a regular 014 inch
standard Synchro micro guidewire with a J configuration. This was
then advanced using a torque device through the occluded right
middle cerebral artery into one of the inferior division branches in
the M2 M3 region followed by the microcatheter. The guidewire was
removed. Good aspiration obtained from the hub of the microcatheter.
A gentle control arteriogram performed through this demonstrated
slow flow through the vessel itself.

A 4 mm x 40 mm Solitaire X retrieval device was then advanced to the
distal end of the microcatheter. The retriever was deployed in the
usual manner. The Zoom aspiration catheter was advanced to the mid
right M1 segment.

Continuous aspiration was then performed at the hub of the
aspiration catheter for 2-1/2 minutes, with proximal flow arrest in
the right internal carotid artery. The retrieval device, the
microcatheter and the aspiration catheter were retrieved and
removed. Copious amounts of clot were captured into the Tuohy Borst,
and also in the retrieval device.

Following reversal of flow arrest, a control arteriogram performed
through the balloon guide catheter demonstrated now improved flow
through the proximal right internal carotid artery extra cranially
and intracranially.

There is now patency of the right anterior cerebral artery with a
stump of the occluded right middle cerebral artery.

A second pass was then made again using the above combination.

The microcatheter was now accessed into the superior division of the
right middle cerebral artery M2 M3 region over a 0.014 inch standard
Synchro micro guidewire.

Free aspiration of a blood was obtained at the hub of the
microcatheter. A Tiger 21 retrieval device was then advanced to the
distal end of the retrieval device.

This was then deployed by retrieving the microcatheter. The proximal
portion of the retrieval device was within the proximal aspect of
the occluded right middle cerebral artery.

Free aspiration was then started through the aspiration catheter
with the Penumbra aspiration device which was positioned in the
distal portion of the occluded clot.

The retrieval device was then opened and closed until there was
kinking at the proximal portion. The microcatheter was then advanced
to the proximal marker on the retrieval device.

With constant aspiration continued, the Tiger 21 retrieval device
was then removed as constant aspiration was applied at the hub of
the aspiration catheter, and also with proximal flow arrest and
aspiration at the hub of the balloon guide with a 60 mL syringe.

Multiple fragments of clot were obtained within the Tuohy Borst, and
also entangled in the retrieval device.

With reversal of the proximal flow arrest, a control arteriogram
performed through the balloon guide in the right internal carotid
artery now demonstrated improved opacification of the right middle
cerebral artery to where there was now opacification of the anterior
temporal branch. Distal branch remains occluded with no change in
anterior cerebral artery. A third pass was now made using an 027 150
cm Marksman microcatheter which was advanced again using the
combination of the 071 Zoom aspiration catheter over a 0.014 inch
standard Synchro micro guidewire.

This combination was advanced into the now the inferior division
with the microcatheter advanced into the M2 region of the inferior
division. The micro guidewire was removed. Good aspiration obtained
from the hub of the 027 microcatheter. A gentle control arteriogram
performed through this now demonstrated free flow into the distal
vasculature.

The aspiration catheter was engaged into the occluded distal right
middle cerebral artery at the origin of the inferior division.
Aspiration was now at the hub of the aspiration catheter embedded in
the right middle cerebral artery clot and also at the hub of the
Marksman catheter for approximately 2 minutes, with proximal flow
arrest in the right internal carotid artery and constant aspiration
at its hub.

The Marksman catheter was then gently retrieved while maintaining
aspiration through the Zoom aspiration catheter. The Zoom aspiration
catheter remained loft to aspiration.

This was then retrieved and removed. Again clots were seen in the
aspirate.

Control arteriogram performed following reversal of flow arrest in
the right internal carotid artery now demonstrated complete
revascularization of the right MCA distribution, into the distal
distribution achieving a TICI 2c revascularization.

The right anterior cerebral artery remained widely patent.

Measurements were now performed of the right internal carotid artery
proximally, and the right common carotid artery distally at the
proposed landing zones for the revascularization stent.

An 021 Tre[place] ProVue microcatheter was advanced to the petrous
segment of the right internal carotid artery over a 0.014 inch
standard Synchro micro guidewire, and replaced with an 014 inch 300
cm Synchro exchange micro guidewire.

Safe position of the tip of the exchange micro guidewire was
verified. It was elected to proceed with placement of a 6/8 mm x 40
mm Xact stent across the diseased previously occluded right internal
carotid artery proximally.

This was retrogradely purged with heparinized saline infusion.

Using the rapid exchange technique, the delivery system of the stent
was then advanced and positioned covering distally, and also the
proximal portion of the landing zones. Stent was deployed in the
usual manner without any difficulty.

The delivery system was removed. Good aspiration obtained from the
hub of the balloon guide in the right common carotid artery.

A control arteriogram performed through this demonstrated excellent
positioning and apposition of the stent with now free flow noted
into the intracranial right ICA and the right middle and right
anterior cerebral arteries being widely patent.

Prior to this patient was loaded with 81 mg aspirin, and 180 mg of
Brilinta via an orogastric tube. CT scan of the brain was then
performed which demonstrated contrast blush of the right basal
ganglia, and also to the 3 focal areas of hyperdensity suspicious
for micro hemorrhages.

A control arteriogram performed through the balloon guide in the
right internal carotid artery following this now demonstrated
extensive clot formation with progression to occlusion of the stent
due to malignant platelet aggregation. Three quarter bolus dose of
Cangrelor was then given intravenously in order to salvage the
occluding stent in the right internal carotid artery.

The balloon guide was then removed following removal of the exchange
micro guidewire.

The diagnostic catheter was then positioned in the left common
carotid artery. This demonstrated the left external carotid artery
and the left internal carotid arteries to be widely patent.

Patency of the left internal carotid to the cranial skull base was
verified. Also the left middle and the left anterior cerebral
arteries were seen to opacify into the capillary and venous phases.

Also almost simultaneously cross-filling via the anterior
communicating artery of the right anterior and the right middle
cerebral arteries is noted via the anterior communicating artery.

No gross filling defects were seen in the middle cerebral artery
distribution.

Another diagnostic arteriogram through the right common carotid
artery demonstrated now complete occlusion of the stented segment of
the right internal carotid artery. The diagnostic catheter was
removed. The 8 French Pinnacle sheath was then exchanged for a short
8 French sheath which in turn was then removed with the successful
application of the 7 French ExoSeal closure device with hemostasis
in the right groin. Distal pulses remained palpable in the feet
bilaterally unchanged. A second CT scan of the brain demonstrated no
significant change in the right basal ganglia with continued
presence of at least 3 foci of hemorrhage. No mass effect or midline
shift was seen.

Patient was left intubated on account of patient's medical condition
and inability to respond appropriately to protect the airway.
Patient was then transferred to the neuro ICU for post
recanalization management.
IMPRESSION: Status post endovascular complete revascularization of the right
middle cerebral artery and the right anterior cerebral artery with 1
pass with a Solitaire 4 mm x 40 mm X retrieval device and
aspiration, and 1 pass with the Tiger 21 retrieval device with
proximal aspiration, and with 1 pass with contact aspiration
achieving a TICI 2c revascularization of the right middle cerebral
artery distribution.

Status post endovascular revascularization of symptomatic acute
occlusion of the right internal carotid proximally with stent
assisted angioplasty with reocclusion secondary to malignant
platelet aggregation. Attempt to rescue with [DATE] bolus dose of
Cangrelor IV.

PLAN:
Follow-up in the clinic 4 to 6 weeks post discharge.

## 2021-12-14 IMAGING — DX DG CHEST 1V PORT
1 series · 1 of 1 positions shown · non-contrast
Comparison: None.

CLINICAL DATA: Code stroke with coiling intubated

EXAM:
PORTABLE CHEST 1 VIEW

[chest]
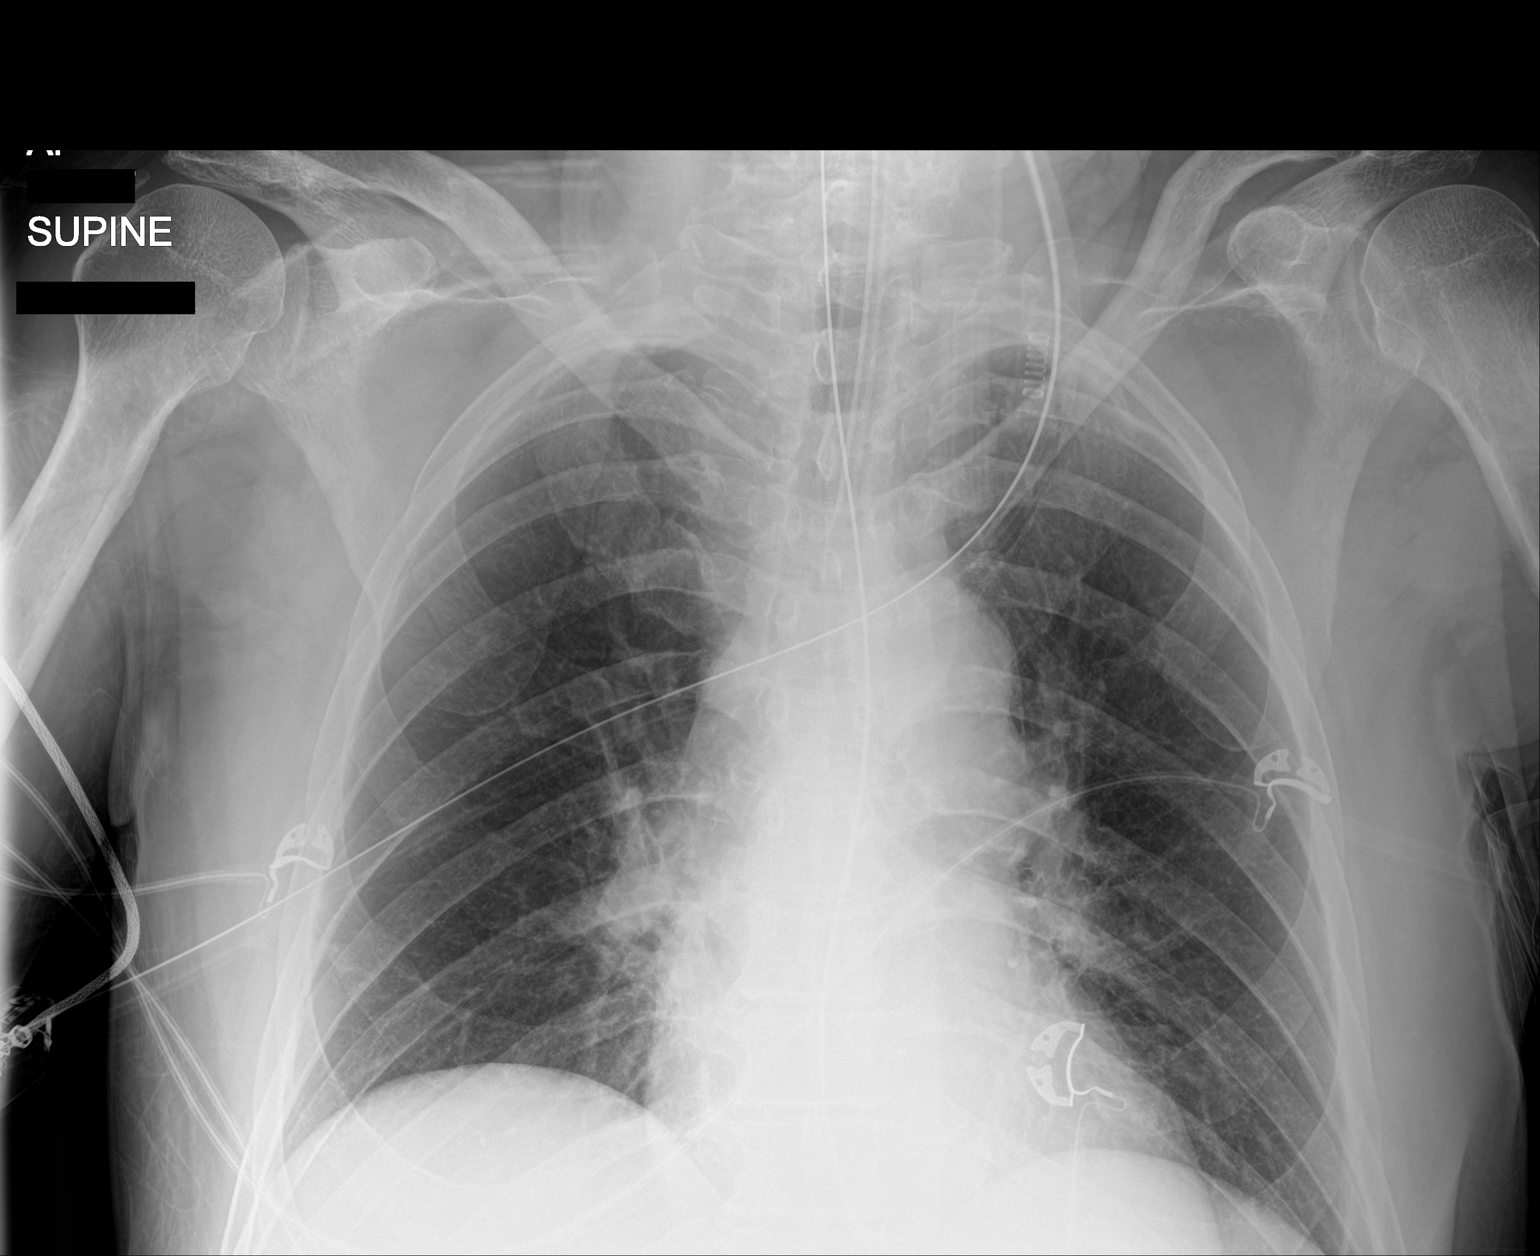

[1 of 1 positions shown; findings below may reference images not displayed]

FINDINGS: Endotracheal tube tip is about 4.2 cm superior to the carina.
Esophageal tube tip is below the diaphragm but incompletely
visualized. No focal opacity or pleural effusion. Normal cardiac
size. No pneumothorax.
IMPRESSION: Endotracheal tube tip about 4.2 cm superior to the carina. Lungs
grossly clear

## 2021-12-14 MED ORDER — FERROUS SULFATE 324 (65 FE) MG PO TBEC: 1.0000 | DELAYED_RELEASE_TABLET | Freq: Every day | ORAL | 1 refills | Status: DC

## 2021-12-14 MED ORDER — GABAPENTIN 300 MG PO CAPS: 300.0000 mg | ORAL_CAPSULE | Freq: Three times a day (TID) | ORAL | 3 refills | Status: DC

## 2021-12-14 MED ORDER — CLOPIDOGREL BISULFATE 75 MG PO TABS: 75.0000 mg | ORAL_TABLET | Freq: Every day | ORAL | 3 refills | Status: DC

## 2021-12-14 MED ORDER — PANTOPRAZOLE SODIUM 20 MG PO TBEC: 20.0000 mg | DELAYED_RELEASE_TABLET | Freq: Every day | ORAL | 3 refills | Status: DC

## 2021-12-14 MED ORDER — ATORVASTATIN CALCIUM 40 MG PO TABS: 40.0000 mg | ORAL_TABLET | Freq: Every day | ORAL | 3 refills | Status: DC

## 2021-12-14 MED ORDER — POTASSIUM CHLORIDE CRYS ER 20 MEQ PO TBCR: 20.0000 meq | EXTENDED_RELEASE_TABLET | Freq: Every day | ORAL | 1 refills | Status: DC

## 2021-12-14 NOTE — Assessment & Plan Note (Addendum)
Left arm, minimal extension of forearm and fingers ?He starts outpatient PT today ?

## 2021-12-14 NOTE — Progress Notes (Signed)
? ?             Established Patient Visit ? ?Patient: Tyler Richards   DOB: 06-13-1951   71 y.o. Male  MRN: [id number] ?Visit Date: 12/14/2021 ? ?Subjective:  ?  ?Chief Complaint  ?Patient presents with  ? Follow-up  ?  3 month f/u on HTN and cholesterol.  ?Medication refills needed for Pantoprazole, Clopidogrel, and Ferrous Sulfate ?Declines vaccines today, pt and wife state they had vaccines at [place] and will get records to our office.  ?Colonoscopy done 6-8 months ago.   ? ?HPI ?Accompanied by wife ? 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 ?Polyarticular gout ?No acute gout exacerbation ?Current use of allopurinol daily, no adverse side effects ?Maintain med dose ?Repeat BMP and uric acid ? ?Hypokalemia ?Repeat bmp ? ?Neurogenic orthostatic hypotension (HCC) ?Stable, no syncopal episodes ?Use of midodrine and florinef prn ?BP Readings from Last 3 Encounters:  ?12/14/21 100/60  ?09/13/21 106/80  ?08/10/21 118/60  ? ?Maintain prn use of medication ? ?Drug-induced insomnia ([place]) ?Resolved with discontinuation of florinef ? ?Wt Readings from Last 3 Encounters:  ?12/14/21 140 lb 12.8 oz (63.9 kg)  ?09/13/21 140 lb 3.2 oz (63.6 kg)  ?08/10/21 143 lb 6.4 oz (65 kg)  ?  ?Reviewed medical, surgical, and social history today ? ?Medications: ?Outpatient Medications Prior to Visit  ?Medication Sig  ? acetaminophen (TYLENOL) 325 MG tablet Take 650 mg by mouth every 6 (six) hours as needed for mild pain or headache.  ? allopurinol (ZYLOPRIM) 100 MG tablet Take 1 tablet (100 mg total) by mouth daily.  ? ASPIRIN ADULT LOW STRENGTH 81 MG EC tablet Take 81 mg by mouth daily.  ? dibucaine (NUPERCAINAL) 1 % ointment Apply topically as needed. Apply to affected area  ? dicyclomine (BENTYL) 20 MG tablet Take by mouth.  ? fludrocortisone (FLORINEF) 0.1 MG tablet Take 1 tablet (0.1 mg total) by mouth daily as needed. If BP <100/60  ? GOODSENSE CLEARLAX 17 GM/SCOOP powder  SMARTSIG:Gram(s) By Mouth  ? HYDROcodone-acetaminophen (NORCO/VICODIN) 5-325 MG tablet Take 0.5 tablets by mouth every 6 (six) hours as needed.  ? midodrine (PROAMATINE) 10 MG tablet Take 1 tablet (10 mg total) by mouth daily as needed. If BP<100/60  ? [DISCONTINUED] atorvastatin (LIPITOR) 80 MG tablet Take 0.5 tablets (40 mg total) by mouth daily.  ? [DISCONTINUED] clopidogrel (PLAVIX) 75 MG tablet Take 1 tablet (75 mg total) by mouth daily.  ? [DISCONTINUED] ferrous sulfate 324 (65 Fe) MG TBEC Take 1 tablet (325 mg total) by mouth daily after breakfast.  ? [DISCONTINUED] gabapentin (NEURONTIN) 300 MG capsule Take 1 capsule (300 mg total) by mouth 3 (three) times daily.  ? [DISCONTINUED] pantoprazole (PROTONIX) 20 MG tablet Take 1 tablet (20 mg total) by mouth daily.  ? [DISCONTINUED] potassium chloride SA (KLOR-CON) 20 MEQ tablet 29mq BID x 1week, then 258m daily continuously  ? ?No facility-administered medications prior to visit.  ? ?Reviewed past medical and social history.  ? ?ROS per HPI above ? ? ?   ?Objective:  ?BP 100/60 (BP Location: Left Arm, Patient Position: Sitting, Cuff Size: Large)   Pulse 68   Temp (!) 97 ?F (36.1 ?C) (Temporal)   Ht 6' (1.829 m)   Wt 140 lb 12.8 oz (63.9 kg)   SpO2 98%   BMI 19.10 kg/m?  ? ?  ? ?Physical Exam ?Vitals reviewed.  ?Constitutional:   ?   General:  He is not in acute distress. ?Cardiovascular:  ?   Rate and Rhythm: Normal rate and regular rhythm.  ?   Pulses: Normal pulses.  ?   Heart sounds: Normal heart sounds.  ?Pulmonary:  ?   Effort: Pulmonary effort is normal.  ?   Breath sounds: Normal breath sounds.  ?Abdominal:  ?   Palpations: Abdomen is soft.  ?   Tenderness: There is no abdominal tenderness.  ?Musculoskeletal:  ?   Right lower leg: No edema.  ?   Left lower leg: No edema.  ?Neurological:  ?   Mental Status: He is alert and oriented to person, place, and time.  ?  ?Results for orders placed or performed in visit on 12/14/21  ?Basic metabolic panel   ?Result Value Ref Range  ? Sodium 139 135 - 145 mEq/L  ? Potassium 3.6 3.5 - 5.1 mEq/L  ? Chloride 101 96 - 112 mEq/L  ? CO2 30 19 - 32 mEq/L  ? Glucose, Bld 125 (H) 70 - 99 mg/dL  ? BUN 18 6 - 23 mg/dL  ? Creatinine, Ser 0.77 0.40 - 1.50 mg/dL  ? GFR 89.97 >60.00 mL/min  ? Calcium 9.8 8.4 - 10.5 mg/dL  ?Magnesium  ?Result Value Ref Range  ? Magnesium 1.7 1.5 - 2.5 mg/dL  ?IBC + Ferritin  ?Result Value Ref Range  ? Iron 100 42 - 165 ug/dL  ? Transferrin 226.0 212.0 - 360.0 mg/dL  ? Saturation Ratios 31.6 20.0 - 50.0 %  ? Ferritin 58.2 22.0 - 322.0 ng/mL  ? TIBC 316.4 250.0 - 450.0 mcg/dL  ?CBC  ?Result Value Ref Range  ? WBC 4.8 4.0 - 10.5 K/uL  ? RBC 3.84 (L) 4.22 - 5.81 Mil/uL  ? Platelets 283.0 150.0 - 400.0 K/uL  ? Hemoglobin 13.2 13.0 - 17.0 g/dL  ? HCT 38.2 (L) 39.0 - 52.0 %  ? MCV 99.5 78.0 - 100.0 fl  ? MCHC 34.6 30.0 - 36.0 g/dL  ? RDW 15.0 11.5 - 15.5 %  ? ?   ?Assessment & Plan:  ?  ?Problem List Items Addressed This Visit   ? ?  ? Cardiovascular and Mediastinum  ? Neurogenic orthostatic hypotension (HCC) - Primary  ?  Stable, no syncopal episodes ?Use of midodrine and florinef prn ?BP Readings from Last 3 Encounters:  ?12/14/21 100/60  ?09/13/21 106/80  ?08/10/21 118/60  ? ?Maintain prn use of medication ?  ?  ? Relevant Medications  ? atorvastatin (LIPITOR) 40 MG tablet  ?  ? Nervous and Auditory  ? 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  Left arm, minimal extension of forearm and fingers ?He starts outpatient PT today ?  ?  ?  ? Musculoskeletal and Integument  ? Polyarticular gout  ?  No acute gout exacerbation ?Current use of allopurinol daily, no adverse side effects ?Maintain med dose ?Repeat BMP and uric acid ?  ?  ? Relevant Medications  ? gabapentin (NEURONTIN) 300 MG capsule  ? Other Relevant Orders  ? Uric acid  ?  ? Other  ? Drug-induced insomnia ([place])  ?  Resolved with discontinuation of florinef ?  ?  ? History of CVA with residual deficit  ? Relevant Medications  ? atorvastatin (LIPITOR) 40 MG  tablet  ? clopidogrel (PLAVIX) 75 MG tablet  ? Hypokalemia  ?  Repeat bmp ?  ?  ? Relevant Medications  ? potassium chloride SA (KLOR-CON M) 20 MEQ tablet  ? Other Relevant Orders  ? Basic  metabolic panel (Completed)  ? Iron deficiency anemia  ? Relevant Medications  ? ferrous sulfate 324 (65 Fe) MG TBEC  ? Other Relevant Orders  ? IBC + Ferritin (Completed)  ? CBC (Completed)  ? ?Other Visit Diagnoses   ? ? Occult blood positive stool      ? Relevant Medications  ? ferrous sulfate 324 (65 Fe) MG TBEC  ? Chronic gastric ulcer without hemorrhage and without perforation      ? Relevant Medications  ? pantoprazole (PROTONIX) 20 MG tablet  ? Hypomagnesemia      ? Relevant Orders  ? Basic metabolic panel (Completed)  ? Magnesium (Completed)  ? ?  ? ?Return in about 6 months (around 06/16/2022) for hypotension, hyperlipidemia. ? ?  ? ?Wilfred Lacy, NP ? ? ?

## 2021-12-14 NOTE — Assessment & Plan Note (Signed)
No acute gout exacerbation ?Current use of allopurinol daily, no adverse side effects ?Maintain med dose ?Repeat BMP and uric acid ?

## 2021-12-14 NOTE — Assessment & Plan Note (Signed)
Stable, no syncopal episodes ?Use of midodrine and florinef prn ?BP Readings from Last 3 Encounters:  ?12/14/21 100/60  ?09/13/21 106/80  ?08/10/21 118/60  ? ?Maintain prn use of medication ?

## 2021-12-14 NOTE — Assessment & Plan Note (Signed)
Repeat bmp

## 2021-12-14 NOTE — Patient Instructions (Signed)
Go to lab for blood draw. ?Maintain current medications. ?

## 2021-12-14 NOTE — Assessment & Plan Note (Signed)
Resolved with discontinuation of florinef ?

## 2021-12-21 ENCOUNTER — Encounter: Payer: Self-pay | Admitting: Nurse Practitioner

## 2021-12-23 ENCOUNTER — Telehealth: Payer: Self-pay | Admitting: Nurse Practitioner

## 2021-12-23 NOTE — Telephone Encounter (Signed)
Pts wife called. They feel like [name] needs OT as well as PT. They wonder if Baldo Ash will call that order in. ?[phone] Maretta Bees ?

## 2021-12-24 NOTE — Telephone Encounter (Signed)
Order form received by Merry Proud, signed and sent back on 12/23/21 ?

## 2021-12-28 NOTE — Telephone Encounter (Signed)
Donyell Ding (Spouse) is calling checking on status of pt's request for an OT therapy order. He is wanting to get this therapy done at [facility] Address: [address] [place], [place] [address] Phone: [phone]. [phone] Joylene Igo)  If this can be done please call [name] at [phone] to giver her an update.  ?

## 2021-12-29 ENCOUNTER — Ambulatory Visit (INDEPENDENT_AMBULATORY_CARE_PROVIDER_SITE_OTHER): Payer: Medicare Other

## 2021-12-29 DIAGNOSIS — Z0100 Encounter for examination of eyes and vision without abnormal findings: Secondary | ICD-10-CM

## 2021-12-29 DIAGNOSIS — Z0000 Encounter for general adult medical examination without abnormal findings: Secondary | ICD-10-CM | POA: Diagnosis not present

## 2021-12-29 NOTE — Patient Instructions (Signed)
Tyler Richards , ?Thank you for taking time to come for your Medicare Wellness Visit. I appreciate your ongoing commitment to your health goals. Please review the following plan we discussed and let me know if I can assist you in the future.  ? ?Screening recommendations/referrals: ?Colonoscopy: 05/2021  per wife [facility]  ?Recommended yearly ophthalmology/optometry visit for glaucoma screening and checkup ?Recommended yearly dental visit for hygiene and checkup ? ?Vaccinations: ?Influenza vaccine: completed  ?Pneumococcal vaccine: completed  ?Tdap vaccine: due  ?Shingles vaccine: will consider    ? ?Advanced directives: none  ? ?Conditions/risks identified: none  ? ?Next appointment: none  ? ?Preventive Care 82 Years and Older, Male ?Preventive care refers to lifestyle choices and visits with your health care provider that can promote health and wellness. ?What does preventive care include? ?A yearly physical exam. This is also called an annual well check. ?Dental exams once or twice a year. ?Routine eye exams. Ask your health care provider how often you should have your eyes checked. ?Personal lifestyle choices, including: ?Daily care of your teeth and gums. ?Regular physical activity. ?Eating a healthy diet. ?Avoiding tobacco and drug use. ?Limiting alcohol use. ?Practicing safe sex. ?Taking low doses of aspirin every day. ?Taking vitamin and mineral supplements as recommended by your health care provider. ?What happens during an annual well check? ?The services and screenings done by your health care provider during your annual well check will depend on your age, overall health, lifestyle risk factors, and family history of disease. ?Counseling  ?Your health care provider may ask you questions about your: ?Alcohol use. ?Tobacco use. ?Drug use. ?Emotional well-being. ?Home and relationship well-being. ?Sexual activity. ?Eating habits. ?History of falls. ?Memory and ability to understand  (cognition). ?Work and work Statistician. ?Screening  ?You may have the following tests or measurements: ?Height, weight, and BMI. ?Blood pressure. ?Lipid and cholesterol levels. These may be checked every 5 years, or more frequently if you are over 68 years old. ?Skin check. ?Lung cancer screening. You may have this screening every year starting at age 96 if you have a 30-pack-year history of smoking and currently smoke or have quit within the past 15 years. ?Fecal occult blood test (FOBT) of the stool. You may have this test every year starting at age 47. ?Flexible sigmoidoscopy or colonoscopy. You may have a sigmoidoscopy every 5 years or a colonoscopy every 10 years starting at age 62. ?Prostate cancer screening. Recommendations will vary depending on your family history and other risks. ?Hepatitis C blood test. ?Hepatitis B blood test. ?Sexually transmitted disease (STD) testing. ?Diabetes screening. This is done by checking your blood sugar (glucose) after you have not eaten for a while (fasting). You may have this done every 1-3 years. ?Abdominal aortic aneurysm (AAA) screening. You may need this if you are a current or former smoker. ?Osteoporosis. You may be screened starting at age 69 if you are at high risk. ?Talk with your health care provider about your test results, treatment options, and if necessary, the need for more tests. ?Vaccines  ?Your health care provider may recommend certain vaccines, such as: ?Influenza vaccine. This is recommended every year. ?Tetanus, diphtheria, and acellular pertussis (Tdap, Td) vaccine. You may need a Td booster every 10 years. ?Zoster vaccine. You may need this after age 31. ?Pneumococcal 13-valent conjugate (PCV13) vaccine. One dose is recommended after age 6. ?Pneumococcal polysaccharide (PPSV23) vaccine. One dose is recommended after age 17. ?Talk to your health care provider about which screenings  and vaccines you need and how often you need them. ?This  information is not intended to replace advice given to you by your health care provider. Make sure you discuss any questions you have with your health care provider. ?Document Released: 10/09/2015 Document Revised: 06/01/2016 Document Reviewed: 07/14/2015 ?Elsevier Interactive Patient Education ? 2017 [place]. ? ?Fall Prevention in the Home ?Falls can cause injuries. They can happen to people of all ages. There are many things you can do to make your home safe and to help prevent falls. ?What can I do on the outside of my home? ?Regularly fix the edges of walkways and driveways and fix any cracks. ?Remove anything that might make you trip as you walk through a door, such as a raised step or threshold. ?Trim any bushes or trees on the path to your home. ?Use bright outdoor lighting. ?Clear any walking paths of anything that might make someone trip, such as rocks or tools. ?Regularly check to see if handrails are loose or broken. Make sure that both sides of any steps have handrails. ?Any raised decks and porches should have guardrails on the edges. ?Have any leaves, snow, or ice cleared regularly. ?Use sand or salt on walking paths during winter. ?Clean up any spills in your garage right away. This includes oil or grease spills. ?What can I do in the bathroom? ?Use night lights. ?Install grab bars by the toilet and in the tub and shower. Do not use towel bars as grab bars. ?Use non-skid mats or decals in the tub or shower. ?If you need to sit down in the shower, use a plastic, non-slip stool. ?Keep the floor dry. Clean up any water that spills on the floor as soon as it happens. ?Remove soap buildup in the tub or shower regularly. ?Attach bath mats securely with double-sided non-slip rug tape. ?Do not have throw rugs and other things on the floor that can make you trip. ?What can I do in the bedroom? ?Use night lights. ?Make sure that you have a light by your bed that is easy to reach. ?Do not use any sheets or  blankets that are too big for your bed. They should not hang down onto the floor. ?Have a firm chair that has side arms. You can use this for support while you get dressed. ?Do not have throw rugs and other things on the floor that can make you trip. ?What can I do in the kitchen? ?Clean up any spills right away. ?Avoid walking on wet floors. ?Keep items that you use a lot in easy-to-reach places. ?If you need to reach something above you, use a strong step stool that has a grab bar. ?Keep electrical cords out of the way. ?Do not use floor polish or wax that makes floors slippery. If you must use wax, use non-skid floor wax. ?Do not have throw rugs and other things on the floor that can make you trip. ?What can I do with my stairs? ?Do not leave any items on the stairs. ?Make sure that there are handrails on both sides of the stairs and use them. Fix handrails that are broken or loose. Make sure that handrails are as long as the stairways. ?Check any carpeting to make sure that it is firmly attached to the stairs. Fix any carpet that is loose or worn. ?Avoid having throw rugs at the top or bottom of the stairs. If you do have throw rugs, attach them to the floor with carpet tape. ?  Make sure that you have a light switch at the top of the stairs and the bottom of the stairs. If you do not have them, ask someone to add them for you. ?What else can I do to help prevent falls? ?Wear shoes that: ?Do not have high heels. ?Have rubber bottoms. ?Are comfortable and fit you well. ?Are closed at the toe. Do not wear sandals. ?If you use a stepladder: ?Make sure that it is fully opened. Do not climb a closed stepladder. ?Make sure that both sides of the stepladder are locked into place. ?Ask someone to hold it for you, if possible. ?Clearly mark and make sure that you can see: ?Any grab bars or handrails. ?First and last steps. ?Where the edge of each step is. ?Use tools that help you move around (mobility aids) if they are  needed. These include: ?Canes. ?Walkers. ?Scooters. ?Crutches. ?Turn on the lights when you go into a dark area. Replace any light bulbs as soon as they burn out. ?Set up your furniture so you have a clear path. Avoid moving yo

## 2021-12-29 NOTE — Progress Notes (Signed)
? ?Subjective:  ? Tyler Richards is a 71 y.o. male who presents for an subsequent  Medicare Annual Wellness Visit. ? ?I connected with Tyler Richards today by telephone and verified that I am speaking with the correct person using two identifiers. ?Location patient: home ?Location provider: work ?Persons participating in the virtual visit: patient, provider. ?  ?I discussed the limitations, risks, security and privacy concerns of performing an evaluation and management service by telephone and the availability of in person appointments. I also discussed with the patient that there may be a patient responsible charge related to this service. The patient expressed understanding and verbally consented to this telephonic visit.  ?  ?Interactive audio and video telecommunications were attempted between this provider and patient, however failed, due to patient having technical difficulties OR patient did not have access to video capability.  We continued and completed visit with audio only. ? ?  ?Review of Systems    ? ?Cardiac Risk Factors include: advanced age (>42mn, >>25women);dyslipidemia;male gender ? ?   ?Objective:  ?  ?Today's Vitals  ? ?There is no height or weight on file to calculate BMI. ? ? ?  12/29/2021  ?  2:04 PM 12/01/2020  ?  2:27 PM 07/09/2020  ?  3:47 PM 06/29/2020  ?  6:00 PM  ?Advanced Directives  ?Does Patient Have a Medical Advance Directive? No No No No  ?Would patient like information on creating a medical advance directive? No - Patient declined No - Patient declined Yes (Inpatient - patient requests chaplain consult to create a medical advance directive) No - Patient declined  ? ? ?Current Medications (verified) ?Outpatient Encounter Medications as of 12/29/2021  ?Medication Sig  ? acetaminophen (TYLENOL) 325 MG tablet Take 650 mg by mouth every 6 (six) hours as needed for mild pain or headache.  ? allopurinol (ZYLOPRIM) 100 MG tablet Take 1 tablet (100 mg total) by mouth daily.  ? ASPIRIN ADULT LOW  STRENGTH 81 MG EC tablet Take 81 mg by mouth daily.  ? atorvastatin (LIPITOR) 40 MG tablet Take 1 tablet (40 mg total) by mouth daily.  ? clopidogrel (PLAVIX) 75 MG tablet Take 1 tablet (75 mg total) by mouth daily.  ? dibucaine (NUPERCAINAL) 1 % ointment Apply topically as needed. Apply to affected area  ? dicyclomine (BENTYL) 20 MG tablet Take by mouth.  ? ferrous sulfate 324 (65 Fe) MG TBEC Take 1 tablet (325 mg total) by mouth daily after breakfast.  ? fludrocortisone (FLORINEF) 0.1 MG tablet Take 1 tablet (0.1 mg total) by mouth daily as needed. If BP <100/60  ? gabapentin (NEURONTIN) 300 MG capsule Take 1 capsule (300 mg total) by mouth 3 (three) times daily.  ? GOODSENSE CLEARLAX 17 GM/SCOOP powder SMARTSIG:Gram(s) By Mouth  ? HYDROcodone-acetaminophen (NORCO/VICODIN) 5-325 MG tablet Take 0.5 tablets by mouth every 6 (six) hours as needed.  ? midodrine (PROAMATINE) 10 MG tablet Take 1 tablet (10 mg total) by mouth daily as needed. If BP<100/60  ? pantoprazole (PROTONIX) 20 MG tablet Take 1 tablet (20 mg total) by mouth daily.  ? potassium chloride SA (KLOR-CON M) 20 MEQ tablet Take 1 tablet (20 mEq total) by mouth daily. 265m BID x 1week, then 2052mdaily continuously  ? ?No facility-administered encounter medications on file as of 12/29/2021.  ? ? ?Allergies (verified) ?Remeron [mirtazapine]  ? ?History: ?Past Medical History:  ?Diagnosis Date  ? Coronary artery disease   ? Hypertension   ? ?Past Surgical History:  ?Procedure  Laterality Date  ? IR ANGIO INTRA EXTRACRAN SEL COM CAROTID INNOMINATE UNI L MOD SED  06/29/2020  ? IR CT HEAD LTD  06/29/2020  ? IR CT HEAD LTD  06/29/2020  ? IR INTRAVSC STENT CERV CAROTID W/O EMB-PROT MOD SED INC ANGIO  06/29/2020  ? IR PERCUTANEOUS ART THROMBECTOMY/INFUSION INTRACRANIAL INC DIAG ANGIO  06/29/2020  ? LAPAROSCOPIC COLON RESECTION N/A 03/03/2021  ? PACEMAKER INSERTION  03/05/2021  ? RADIOLOGY WITH ANESTHESIA N/A 06/29/2020  ? Procedure: IR WITH ANESTHESIA;  Surgeon:  Radiologist, Medication, MD;  Location: [place];  Service: Radiology;  Laterality: N/A;  ? ?History reviewed. No pertinent family history. ?Social History  ? ?Socioeconomic History  ? Marital status: Married  ?  Spouse name: Not on file  ? Number of children: Not on file  ? Years of education: Not on file  ? Highest education level: Not on file  ?Occupational History  ? Not on file  ?Tobacco Use  ? Smoking status: Never  ? Smokeless tobacco: Never  ?Vaping Use  ? Vaping Use: Never used  ?Substance and Sexual Activity  ? Alcohol use: Not Currently  ? Drug use: Never  ? Sexual activity: Not on file  ?Other Topics Concern  ? Not on file  ?Social History Narrative  ? Not on file  ? ?Social Determinants of Health  ? ?Financial Resource Strain: Low Risk   ? Difficulty of Paying Living Expenses: Not hard at all  ?Food Insecurity: No Food Insecurity  ? Worried About Charity fundraiser in the Last Year: Never true  ? Ran Out of Food in the Last Year: Never true  ?Transportation Needs: No Transportation Needs  ? Lack of Transportation (Medical): No  ? Lack of Transportation (Non-Medical): No  ?Physical Activity: Insufficiently Active  ? Days of Exercise per Week: 3 days  ? Minutes of Exercise per Session: 30 min  ?Stress: No Stress Concern Present  ? Feeling of Stress : Not at all  ?Social Connections: Moderately Integrated  ? Frequency of Communication with Friends and Family: Twice a week  ? Frequency of Social Gatherings with Friends and Family: Twice a week  ? Attends Religious Services: More than 4 times per year  ? Active Member of Clubs or Organizations: No  ? Attends Archivist Meetings: Never  ? Marital Status: Married  ? ? ?Tobacco Counseling ?Counseling given: Not Answered ? ? ?Clinical Intake: ? ?Pre-visit preparation completed: Yes ? ?Pain : No/denies pain ? ?  ? ?Nutritional Risks: None ?Diabetes: No ? ?How often do you need to have someone help you when you read instructions, pamphlets, or other  written materials from your doctor or pharmacy?: 1 - Never ?What is the last grade level you completed in school?: GED ? ?Diabetic?no  ? ?Interpreter Needed?: No ? ?Information entered by :: [email or web address] ? ? ?Activities of Daily Living ? ?  12/29/2021  ?  2:06 PM  ?In your present state of health, do you have any difficulty performing the following activities:  ?Hearing? 0  ?Vision? 0  ?Difficulty concentrating or making decisions? 0  ?Walking or climbing stairs? 0  ?Dressing or bathing? 0  ?Doing errands, shopping? 0  ?Preparing Food and eating ? N  ?Using the Toilet? N  ?In the past six months, have you accidently leaked urine? N  ?Do you have problems with loss of bowel control? N  ?Managing your Medications? N  ?Managing your Finances? N  ?Housekeeping or managing your Housekeeping? N  ? ? ?  Patient Care Team: ?Nche, Charlene Brooke, NP as PCP - General (Internal Medicine) ? ?Indicate any recent Medical Services you may have received from other than [facility] providers in the past year (date may be approximate). ? ?   ?Assessment:  ? This is a routine wellness examination for Tyler Richards. ? ?Hearing/Vision screen ?Vision Screening - Comments:: Referral 12/29/2021 ? ?Dietary issues and exercise activities discussed: ?Current Exercise Habits: Home exercise routine, Type of exercise: walking, Time (Minutes): 30, Frequency (Times/Week): 3, Weekly Exercise (Minutes/Week): 90, Intensity: Mild, Exercise limited by: orthopedic condition(s) ? ? Goals Addressed   ? ?  ?  ?  ?  ? This Visit's Progress  ?  Patient Stated   On track  ?  Continue physical therapy to improve ambulation ?  ? ?  ? ?Depression Screen ? ?  12/29/2021  ?  2:06 PM 04/19/2021  ? 11:18 AM 12/01/2020  ?  2:32 PM 08/26/2020  ?  2:35 PM  ?PHQ 2/9 Scores  ?PHQ - 2 Score 0 0 0 0  ?PHQ- 9 Score    3  ?  ?Fall Risk ? ?  12/29/2021  ?  2:05 PM 07/20/2021  ? 10:16 AM 04/19/2021  ? 11:18 AM 12/01/2020  ?  2:32 PM 08/26/2020  ?  2:33 PM  ?Fall Risk   ?Falls in the past year? 0 0 1 0 0   ?Number falls in past yr: 0 0 1 0 0  ?Injury with Fall? 0 0 1 0 0  ?Risk for fall due to :   History of fall(s) Impaired balance/gait   ?Risk for fall due to: Comment walker /cane      ?Follow up Falls evaluation c

## 2022-01-04 NOTE — Telephone Encounter (Signed)
Pts wife called again would like for OT to be added to the PT he is receiving now. Please advise. ?

## 2022-01-11 NOTE — Addendum Note (Signed)
Addended by: Renette Butters on: 01/11/2022 04:33 PM ? ? Modules accepted: Orders ? ?

## 2022-01-11 NOTE — Telephone Encounter (Signed)
Hey Tyler Richards [name] wife wants update on OT order.Can you confirm if it was ever faxed over?  ?

## 2022-01-13 NOTE — Telephone Encounter (Signed)
Spoke to [facility] at [phone], they need an OT order. They will refax the order and I will have provider sign and fax back to [phone].  Dm/cma  ?

## 2022-01-13 NOTE — Addendum Note (Signed)
Addended by: Konrad Saha on: 01/13/2022 03:18 PM ? ? Modules accepted: Orders ? ?

## 2022-01-27 ENCOUNTER — Other Ambulatory Visit: Payer: Self-pay | Admitting: Nurse Practitioner

## 2022-01-27 DIAGNOSIS — M109 Gout, unspecified: Secondary | ICD-10-CM

## 2022-01-28 ENCOUNTER — Encounter: Payer: Self-pay | Admitting: Physical Medicine & Rehabilitation

## 2022-01-28 ENCOUNTER — Encounter: Payer: Medicare Other | Attending: Physical Medicine & Rehabilitation | Admitting: Physical Medicine & Rehabilitation

## 2022-01-28 VITALS — BP 109/72 | HR 60 | Ht 72.0 in | Wt 155.4 lb

## 2022-01-28 DIAGNOSIS — G8110 Spastic hemiplegia affecting unspecified side: Secondary | ICD-10-CM

## 2022-01-28 NOTE — Patient Instructions (Signed)

## 2022-01-28 NOTE — Progress Notes (Signed)
? ?Subjective:  ? ? Patient ID: Tyler Richards, male    DOB: May 19, 1951, 71 y.o.   MRN: [id number] ?06/29/2020 with acute onset of left-sided weakness and slurred speech.  Cranial CT scan showed hyperdense distal right ICA and proximal MCA.  Blunted appearance of the posterior right putamen.  Chronic right high frontal cortex infarct.  Patient did not receive TPA.  CT angiogram of head and neck emergent large vessel occlusion with no flow seen in the right internal carotid artery or proximal MCA.  Patient underwent right MCA thrombectomy right ICA stent placement 06/29/2020 per interventional radiology.  Most recent MRI and imaging revealed acute infarct right basal ganglia with nonprogressive hemorrhage when correlated with a prior CT of 06/29/2020.  Lower extremity Dopplers no DVT.  Carotid Dopplers no ICA stenosis.  Echocardiogram with ejection fraction of 50 to 78% grade 1 diastolic dysfunction.  Admission chemistries unremarkable aside from glucose 104 urine drug screen negative.  Patient did receive cardiology consult for bradycardia consistent with hyper vagotonia and maintained on ProAmatine.  EKG normal sinus rhythm 70s to 80s occasional sinus bradycardia.  Patient initially maintained on aspirin as well as Brilinta.  Extubated 06/30/2020.  Urine culture greater 100,000 Enterobacter placed on Maxipime changed to Bactrim.  Gastroenterology services consulted due to some bright red blood with bowel movement currently holding off on any endoscopic evaluation monitoring hemoglobin hematocrit with latest hemoglobin 9.8.  His Brilinta had been placed on hold after rectal outlet bleeding continued on low-dose aspirin at the recommendations of neurology services ?HPI ?Here for rehab f/u post CVA  ?Interval hx Colon Ca treated with radiation and chemo ?Had colostomy x 4 mo but now reversed ?S/P PPM placement  ?Receiving OP PT minA 100' with HW . ?Tone increased LUE per OT , request further eval  ?Pain Inventory ?Average  Pain 2 ?Pain Right Now 2 ?My pain is dull and aching ? ?LOCATION OF PAIN  general body aches ? ?BOWEL ?Number of stools per week: 5-7 ? ? ?BLADDER ?Normal ? ? ? ?Mobility ?walk with assistance ?ability to climb steps?  no ?do you drive?  no ?use a wheelchair ?needs help with transfers ? ?Function ?retired ?I need assistance with the following:  toileting and household duties ? ?Neuro/Psych ?No problems in this area ? ?Prior Studies ?Any changes since last visit?  no ? ?Physicians involved in your care ?Any changes since last visit?  no ? ? ?History reviewed. No pertinent family history. ?Social History  ? ?Socioeconomic History  ?? Marital status: Married  ?  Spouse name: Not on file  ?? Number of children: Not on file  ?? Years of education: Not on file  ?? Highest education level: Not on file  ?Occupational History  ?? Not on file  ?Tobacco Use  ?? Smoking status: Never  ?? Smokeless tobacco: Never  ?Vaping Use  ?? Vaping Use: Never used  ?Substance and Sexual Activity  ?? Alcohol use: Not Currently  ?? Drug use: Never  ?? Sexual activity: Not on file  ?Other Topics Concern  ?? Not on file  ?Social History Narrative  ?? Not on file  ? ?Social Determinants of Health  ? ?Financial Resource Strain: Low Risk   ?? Difficulty of Paying Living Expenses: Not hard at all  ?Food Insecurity: No Food Insecurity  ?? Worried About Charity fundraiser in the Last Year: Never true  ?? Ran Out of Food in the Last Year: Never true  ?Transportation Needs: No Transportation  Needs  ?? Lack of Transportation (Medical): No  ?? Lack of Transportation (Non-Medical): No  ?Physical Activity: Insufficiently Active  ?? Days of Exercise per Week: 3 days  ?? Minutes of Exercise per Session: 30 min  ?Stress: No Stress Concern Present  ?? Feeling of Stress : Not at all  ?Social Connections: Moderately Integrated  ?? Frequency of Communication with Friends and Family: Twice a week  ?? Frequency of Social Gatherings with Friends and Family: Twice a  week  ?? Attends Religious Services: More than 4 times per year  ?? Active Member of Clubs or Organizations: No  ?? Attends Archivist Meetings: Never  ?? Marital Status: Married  ? ?Past Surgical History:  ?Procedure Laterality Date  ?? IR ANGIO INTRA EXTRACRAN SEL COM CAROTID INNOMINATE UNI L MOD SED  06/29/2020  ?? IR CT HEAD LTD  06/29/2020  ?? IR CT HEAD LTD  06/29/2020  ?? IR INTRAVSC STENT CERV CAROTID W/O EMB-PROT MOD SED INC ANGIO  06/29/2020  ?? IR PERCUTANEOUS ART THROMBECTOMY/INFUSION INTRACRANIAL INC DIAG ANGIO  06/29/2020  ?? LAPAROSCOPIC COLON RESECTION N/A 03/03/2021  ?? PACEMAKER INSERTION  03/05/2021  ?? RADIOLOGY WITH ANESTHESIA N/A 06/29/2020  ? Procedure: IR WITH ANESTHESIA;  Surgeon: Radiologist, Medication, MD;  Location: [place];  Service: Radiology;  Laterality: N/A;  ? ?Past Medical History:  ?Diagnosis Date  ?? Coronary artery disease   ?? Hypertension   ? ?BP 109/72   Pulse 60   Ht 6' (1.829 m)   Wt 155 lb 6.4 oz (70.5 kg)   SpO2 97%   BMI 21.08 kg/m?  ? ?Opioid Risk Score:   ?Fall Risk Score:  `1 ? ?Depression screen PHQ 2/9 ? ? ?  01/28/2022  ? 11:00 AM 12/29/2021  ?  2:06 PM 04/19/2021  ? 11:18 AM 12/01/2020  ?  2:32 PM 08/26/2020  ?  2:35 PM  ?Depression screen PHQ 2/9  ?Decreased Interest 0 0 0 0 0  ?Down, Depressed, Hopeless 0 0 0 0 0  ?PHQ - 2 Score 0 0 0 0 0  ?Altered sleeping     3  ?Tired, decreased energy     0  ?Change in appetite     0  ?Feeling bad or failure about yourself      0  ?Trouble concentrating     0  ?Moving slowly or fidgety/restless     0  ?Suicidal thoughts     0  ?PHQ-9 Score     3  ?  ? ?Review of Systems  ?Constitutional: Negative.   ?HENT: Negative.    ?Eyes: Negative.   ?Respiratory: Negative.    ?Cardiovascular: Negative.   ?Gastrointestinal: Negative.   ?Endocrine: Negative.   ?Genitourinary: Negative.   ?Musculoskeletal:  Positive for gait problem.  ?Skin: Negative.   ?Allergic/Immunologic: Negative.   ?Hematological: Negative.    ?Psychiatric/Behavioral: Negative.    ? ?   ?Objective:  ? Physical Exam ? ?MMT ?Trace L delt ?0/5 Biceps and triceps ?2- finger flex /ext ? ?LUE Tone ?Pec MAS 4 ?Elbow flex MAS 4 both Biceps and BR  ?Finger flexors 2/3  ? ?LLE tone ?Hamstring 1/2 ?Plantar flexors 1 ? ? ?   ?Assessment & Plan:  ? ? Right MCA infarct with Left hemiparesis.  LLE tone has improved but LUE  ?Tone increased to MAS 4 , discussed recommendation for Botox to reduce UE tone, no longer needs LE injection  ?Cont PT, OT, informed pt that it will take  1-2 wks for fulleffect ?Also may have elbow flexor contracture which would not respond to Botox , however cannot know for sure until 2 wks post procedure , RTC 6wks ? ? ?Botox Injection for spasticity using needle EMG guidance ? ?Dilution: 50 Units/ml ?Indication: Severe spasticity which interferes with ADL,mobility and/or  hygiene and is unresponsive to medication management and other conservative care ?Informed consent was obtained after describing risks and benefits of the procedure with the patient. This includes bleeding, bruising, infection, excessive weakness, or medication side effects. A REMS form is on file and signed. ?Needle: 27g 1" needle electrode ?Number of units per muscle ?Pectoralis100 ?Biceps200 ?FCR0 ?[place] ?YPE96 ?FDP25 ?Brachio rad 50U  ?All injections were done after obtaining appropriate EMG activity and after negative drawback for blood. The patient tolerated the procedure well. Post procedure instructions were given. A followup appointment was made.   ?

## 2022-01-28 NOTE — Telephone Encounter (Signed)
Chart supports Rx 

## 2022-03-11 ENCOUNTER — Encounter: Payer: Medicare Other | Attending: Physical Medicine & Rehabilitation | Admitting: Physical Medicine & Rehabilitation

## 2022-03-11 ENCOUNTER — Encounter: Payer: Self-pay | Admitting: Physical Medicine & Rehabilitation

## 2022-03-11 VITALS — BP 94/65 | HR 61

## 2022-03-11 DIAGNOSIS — G8110 Spastic hemiplegia affecting unspecified side: Secondary | ICD-10-CM | POA: Insufficient documentation

## 2022-03-11 NOTE — Progress Notes (Signed)
Subjective:    Patient ID: Tyler Richards, male    DOB: 1951/01/29, 71 y.o.   MRN: [id number] 06/29/2020 with acute onset of left-sided weakness and slurred speech.  Cranial CT scan showed hyperdense distal right ICA and proximal MCA.  Blunted appearance of the posterior right putamen.  Chronic right high frontal cortex infarct.  Patient did not receive TPA.  CT angiogram of head and neck emergent large vessel occlusion with no flow seen in the right internal carotid artery or proximal MCA.  Patient underwent right MCA thrombectomy right ICA stent placement 06/29/2020 per interventional radiology.  Most recent MRI and imaging revealed acute infarct right basal ganglia with nonprogressive hemorrhage when correlated with a prior CT of 06/29/2020.  Lower extremity Dopplers no DVT.  Carotid Dopplers no ICA stenosis.  Echocardiogram with ejection fraction of 50 to 94% grade 1 diastolic dysfunction.  Admission chemistries unremarkable aside from glucose 104 urine drug screen negative.  Patient did receive cardiology consult for bradycardia consistent with hyper vagotonia and maintained on ProAmatine.  EKG normal sinus rhythm 70s to 80s occasional sinus bradycardia.  Patient initially maintained on aspirin as well as Brilinta.  Extubated 06/30/2020.  Urine culture greater 100,000 Enterobacter placed on Maxipime changed to Bactrim.  Gastroenterology services consulted due to some bright red blood with bowel movement currently holding off on any endoscopic evaluation monitoring hemoglobin hematocrit with latest hemoglobin 9.8.  His Brilinta had been placed on hold after rectal outlet bleeding continued on low-dose aspirin at the recommendations of neurology services HPI  Doing well in therapy amb distance improved to 375' vs 100' last visit.  Using quad cane  No falls  Elbow flexor tone improved since botox injection , hand no longer in face  Pectoralis100 Biceps200 FCR0 FCU0 FDS25 FDP25 Brachio rad 50U     Pain Inventory Average Pain 3 Pain Right Now 3 My pain is dull  In the last 24 hours, has pain interfered with the following? General activity 0 Relation with others 0 Enjoyment of life 0 What TIME of day is your pain at its worst? night Sleep (in general) Fair  Pain is worse with: unsure Pain improves with: medication Relief from Meds: 2  History reviewed. No pertinent family history. Social History   Socioeconomic History   Marital status: Married    Spouse name: Not on file   Number of children: Not on file   Years of education: Not on file   Highest education level: Not on file  Occupational History   Not on file  Tobacco Use   Smoking status: Never   Smokeless tobacco: Never  Vaping Use   Vaping Use: Never used  Substance and Sexual Activity   Alcohol use: Not Currently   Drug use: Never   Sexual activity: Not on file  Other Topics Concern   Not on file  Social History Narrative   Not on file   Social Determinants of Health   Financial Resource Strain: Low Risk  (12/29/2021)   Overall Financial Resource Strain (CARDIA)    Difficulty of Paying Living Expenses: Not hard at all  Food Insecurity: No Food Insecurity (12/29/2021)   Hunger Vital Sign    Worried About Running Out of Food in the Last Year: Never true    [employer] in the Last Year: Never true  Transportation Needs: No Transportation Needs (12/29/2021)   PRAPARE - Hydrologist (Medical): No    Lack of Transportation (  Non-Medical): No  Physical Activity: Insufficiently Active (12/29/2021)   Exercise Vital Sign    Days of Exercise per Week: 3 days    Minutes of Exercise per Session: 30 min  Stress: No Stress Concern Present (12/29/2021)   [place]    Feeling of Stress : Not at all  Social Connections: Moderately Integrated (12/29/2021)   Social Connection and Isolation Panel [NHANES]    Frequency of  Communication with Friends and Family: Twice a week    Frequency of Social Gatherings with Friends and Family: Twice a week    Attends Religious Services: More than 4 times per year    Active Member of [employer] or Organizations: No    Attends Music therapist: Never    Marital Status: Married   Past Surgical History:  Procedure Laterality Date   IR ANGIO INTRA EXTRACRAN SEL COM CAROTID INNOMINATE UNI L MOD SED  06/29/2020   IR CT HEAD LTD  06/29/2020   IR CT HEAD LTD  06/29/2020   IR INTRAVSC STENT CERV CAROTID W/O EMB-PROT MOD SED INC ANGIO  06/29/2020   IR PERCUTANEOUS ART THROMBECTOMY/INFUSION INTRACRANIAL INC DIAG ANGIO  06/29/2020   LAPAROSCOPIC COLON RESECTION N/A 03/03/2021   PACEMAKER INSERTION  03/05/2021   RADIOLOGY WITH ANESTHESIA N/A 06/29/2020   Procedure: IR WITH ANESTHESIA;  Surgeon: Radiologist, Medication, MD;  Location: [place];  Service: Radiology;  Laterality: N/A;   Past Surgical History:  Procedure Laterality Date   IR ANGIO INTRA EXTRACRAN SEL COM CAROTID INNOMINATE UNI L MOD SED  06/29/2020   IR CT HEAD LTD  06/29/2020   IR CT HEAD LTD  06/29/2020   IR INTRAVSC STENT CERV CAROTID W/O EMB-PROT MOD SED INC ANGIO  06/29/2020   IR PERCUTANEOUS ART THROMBECTOMY/INFUSION INTRACRANIAL INC DIAG ANGIO  06/29/2020   LAPAROSCOPIC COLON RESECTION N/A 03/03/2021   PACEMAKER INSERTION  03/05/2021   RADIOLOGY WITH ANESTHESIA N/A 06/29/2020   Procedure: IR WITH ANESTHESIA;  Surgeon: Radiologist, Medication, MD;  Location: [place];  Service: Radiology;  Laterality: N/A;   Past Medical History:  Diagnosis Date   Coronary artery disease    Hypertension    BP 94/65   Pulse 61   SpO2 96%   Opioid Risk Score:   Fall Risk Score:  `1  Depression screen [facility] 2/9     03/11/2022    9:50 AM 01/28/2022   11:00 AM 12/29/2021    2:06 PM 04/19/2021   11:18 AM 12/01/2020    2:32 PM 08/26/2020    2:35 PM  Depression screen PHQ 2/9  Decreased Interest 0 0 0 0 0 0  Down,  Depressed, Hopeless 0 0 0 0 0 0  PHQ - 2 Score 0 0 0 0 0 0  Altered sleeping      3  Tired, decreased energy      0  Change in appetite      0  Feeling bad or failure about yourself       0  Trouble concentrating      0  Moving slowly or fidgety/restless      0  Suicidal thoughts      0  PHQ-9 Score      3     Review of Systems  Musculoskeletal:        Left sided  Neurological:  Positive for weakness.  All other systems reviewed and are negative.     Objective:   Physical  Exam Vitals reviewed.  HENT:     Head: Normocephalic and atraumatic.  Eyes:     Extraocular Movements: Extraocular movements intact.     Pupils: Pupils are equal, round, and reactive to light.  Neurological:     Mental Status: He is alert and oriented to person, place, and time.  Psychiatric:        Mood and Affect: Mood normal.        Behavior: Behavior normal.  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

## 2022-04-26 ENCOUNTER — Other Ambulatory Visit: Payer: Self-pay | Admitting: Nurse Practitioner

## 2022-04-26 DIAGNOSIS — R195 Other fecal abnormalities: Secondary | ICD-10-CM

## 2022-04-26 DIAGNOSIS — D500 Iron deficiency anemia secondary to blood loss (chronic): Secondary | ICD-10-CM

## 2022-04-26 NOTE — Telephone Encounter (Signed)
Chart supports Rx Last OV: 11/2021 Next OV: 05/2022

## 2022-05-05 ENCOUNTER — Encounter: Payer: Medicare Other | Attending: Physical Medicine & Rehabilitation | Admitting: Physical Medicine & Rehabilitation

## 2022-05-05 ENCOUNTER — Encounter: Payer: Self-pay | Admitting: Physical Medicine & Rehabilitation

## 2022-05-05 VITALS — BP 107/71 | HR 61 | Ht 72.0 in | Wt 144.0 lb

## 2022-05-05 DIAGNOSIS — G8110 Spastic hemiplegia affecting unspecified side: Secondary | ICD-10-CM | POA: Diagnosis present

## 2022-05-05 NOTE — Patient Instructions (Signed)

## 2022-05-05 NOTE — Progress Notes (Signed)
Botox Injection for spasticity using needle EMG guidance  Dilution: 50 Units/ml Indication: Severe spasticity which interferes with ADL,mobility and/or  hygiene and is unresponsive to medication management and other conservative care Informed consent was obtained after describing risks and benefits of the procedure with the patient. This includes bleeding, bruising, infection, excessive weakness, or medication side effects. A REMS form is on file and signed. Needle: 27g 1in needle electrode Number of units per muscle LEFT Biceps200 FDS50,FDP50 Brachio rad 50U  Waste 50U All injections were done after obtaining appropriate EMG activity and after negative drawback for blood. The patient tolerated the procedure well. Post procedure instructions were given. A followup appointment was made.   Referral to OT for LUE ROM, NM re education

## 2022-05-20 NOTE — Telephone Encounter (Signed)
Na

## 2022-06-16 ENCOUNTER — Encounter: Payer: Medicare Other | Attending: Physical Medicine & Rehabilitation | Admitting: Physical Medicine & Rehabilitation

## 2022-06-16 DIAGNOSIS — G8110 Spastic hemiplegia affecting unspecified side: Secondary | ICD-10-CM | POA: Insufficient documentation

## 2022-06-17 ENCOUNTER — Encounter: Payer: Self-pay | Admitting: Nurse Practitioner

## 2022-06-17 ENCOUNTER — Ambulatory Visit (INDEPENDENT_AMBULATORY_CARE_PROVIDER_SITE_OTHER): Payer: Medicare Other | Admitting: Nurse Practitioner

## 2022-06-17 VITALS — BP 128/80 | HR 67 | Temp 96.9°F | Ht 72.0 in | Wt 145.6 lb

## 2022-06-17 DIAGNOSIS — Z125 Encounter for screening for malignant neoplasm of prostate: Secondary | ICD-10-CM | POA: Diagnosis not present

## 2022-06-17 DIAGNOSIS — G903 Multi-system degeneration of the autonomic nervous system: Secondary | ICD-10-CM | POA: Diagnosis not present

## 2022-06-17 DIAGNOSIS — Z136 Encounter for screening for cardiovascular disorders: Secondary | ICD-10-CM

## 2022-06-17 DIAGNOSIS — I10 Essential (primary) hypertension: Secondary | ICD-10-CM

## 2022-06-17 DIAGNOSIS — Z23 Encounter for immunization: Secondary | ICD-10-CM

## 2022-06-17 DIAGNOSIS — R739 Hyperglycemia, unspecified: Secondary | ICD-10-CM | POA: Diagnosis not present

## 2022-06-17 DIAGNOSIS — Z13220 Encounter for screening for lipoid disorders: Secondary | ICD-10-CM | POA: Diagnosis not present

## 2022-06-17 DIAGNOSIS — Z85048 Personal history of other malignant neoplasm of rectum, rectosigmoid junction, and anus: Secondary | ICD-10-CM

## 2022-06-17 DIAGNOSIS — D500 Iron deficiency anemia secondary to blood loss (chronic): Secondary | ICD-10-CM | POA: Diagnosis not present

## 2022-06-17 DIAGNOSIS — E876 Hypokalemia: Secondary | ICD-10-CM

## 2022-06-17 DIAGNOSIS — I495 Sick sinus syndrome: Secondary | ICD-10-CM

## 2022-06-17 LAB — IBC + FERRITIN
Ferritin: 85.9 ng/mL (ref 22.0–322.0)
Iron: 52 ug/dL (ref 42–165)
Saturation Ratios: 19.9 % — ABNORMAL LOW (ref 20.0–50.0)
TIBC: 261.8 ug/dL (ref 250.0–450.0)
Transferrin: 187 mg/dL — ABNORMAL LOW (ref 212.0–360.0)

## 2022-06-17 LAB — RENAL FUNCTION PANEL
Albumin: 3.5 g/dL (ref 3.5–5.2)
BUN: 21 mg/dL (ref 6–23)
CO2: 27 mEq/L (ref 19–32)
Calcium: 9.5 mg/dL (ref 8.4–10.5)
Chloride: 101 mEq/L (ref 96–112)
Creatinine, Ser: 0.85 mg/dL (ref 0.40–1.50)
GFR: 87.01 mL/min (ref >60.00)
Glucose, Bld: 108 mg/dL — ABNORMAL HIGH (ref 70–99)
Phosphorus: 2.9 mg/dL (ref 2.3–4.6)
Potassium: 3.9 mEq/L (ref 3.5–5.1)
Sodium: 138 mEq/L (ref 135–145)

## 2022-06-17 LAB — CBC
HCT: 37.8 % — ABNORMAL LOW (ref 39.0–52.0)
Hemoglobin: 12.7 g/dL — ABNORMAL LOW (ref 13.0–17.0)
MCHC: 33.7 g/dL (ref 30.0–36.0)
MCV: 96.4 fl (ref 78.0–100.0)
Platelets: 344 10*3/uL (ref 150.0–400.0)
RBC: 3.92 Mil/uL — ABNORMAL LOW (ref 4.22–5.81)
RDW: 14.4 % (ref 11.5–15.5)
WBC: 6.8 10*3/uL (ref 4.0–10.5)

## 2022-06-17 LAB — PSA, MEDICARE: PSA: 0.33 ng/ml (ref 0.10–4.00)

## 2022-06-17 LAB — LDL CHOLESTEROL, DIRECT: Direct LDL: 63 mg/dL

## 2022-06-17 NOTE — Assessment & Plan Note (Signed)
Repeat cbc and iron Denies any melena or hematochezia

## 2022-06-17 NOTE — Assessment & Plan Note (Addendum)
Under the care of [facility] cardiology: Dr. Gerarda Gunther and Dr.  Has remote pacemaker check every 59month Denies any syncope

## 2022-06-17 NOTE — Progress Notes (Signed)
Established Patient Visit  Patient: Tyler Richards   DOB: 1951/06/09   71 y.o. Male  MRN: [id number] Visit Date: 06/17/2022  Subjective:    Chief Complaint  Patient presents with   Office Visit    Hypotension/ Hyperlipidemia F/u Pt not fasting No concerns    HPI Accompanied by his wife History of rectal cancer completed radiation therapy S/p lower resection of rectum Followed by Gen surg: Dr. Mia Creek and Rad. Oc: Dr. Threasa Heads [name]) He denies any change in GI/ GU function Constipation improved with miralax Stable weight and appetite Wt Readings from Last 3 Encounters:  06/17/22 145 lb 9.6 oz (66 kg)  05/05/22 144 lb (65.3 kg)  01/28/22 155 lb 6.4 oz (70.5 kg)   He is scheduled for Colonoscopy in 07/2022 and CT ABD/pelvic 06/2022.  Sick sinus syndrome [name] Under the care of [place] cardiology: Dr. Gerarda Gunther and Dr.  Has remote pacemaker check every 77month Denies any syncope  Hypertension Stable without medication No use of midodrine and florinef at this time BP Readings from Last 3 Encounters:  06/17/22 128/80  05/05/22 107/71  03/11/22 94/65    Hyperglycemia Repeat hgbA1c  Iron deficiency anemia Repeat cbc and iron Denies any melena or hematochezia  Hypokalemia Repeat BMP   Wt Readings from Last 3 Encounters:  06/17/22 145 lb 9.6 oz (66 kg)  05/05/22 144 lb (65.3 kg)  01/28/22 155 lb 6.4 oz (70.5 kg)    BP Readings from Last 3 Encounters:  06/17/22 128/80  05/05/22 107/71  03/11/22 94/65    Reviewed medical, surgical, and social history today  Medications: Outpatient Medications Prior to Visit  Medication Sig   acetaminophen (TYLENOL) 325 MG tablet Take 650 mg by mouth every 6 (six) hours as needed for mild pain or headache.   allopurinol (ZYLOPRIM) 100 MG tablet TAKE 1 TABLET BY MOUTH EVERY DAY   ASPIRIN ADULT LOW STRENGTH 81 MG EC tablet Take 81 mg by mouth daily.   atorvastatin (LIPITOR) 40 MG tablet Take 1 tablet  (40 mg total) by mouth daily.   clopidogrel (PLAVIX) 75 MG tablet Take 1 tablet (75 mg total) by mouth daily.   dibucaine (NUPERCAINAL) 1 % ointment Apply topically as needed. Apply to affected area   dicyclomine (BENTYL) 20 MG tablet Take by mouth.   ferrous sulfate (FEROSUL) 325 (65 FE) MG tablet TAKE 1 TABLET BY MOUTH DAILY AFTER BREAKFAST   fludrocortisone (FLORINEF) 0.1 MG tablet Take 1 tablet (0.1 mg total) by mouth daily as needed. If BP <100/60   gabapentin (NEURONTIN) 300 MG capsule Take 1 capsule (300 mg total) by mouth 3 (three) times daily.   HYDROcodone-acetaminophen (NORCO/VICODIN) 5-325 MG tablet Take 0.5 tablets by mouth every 6 (six) hours as needed.   midodrine (PROAMATINE) 10 MG tablet Take 1 tablet (10 mg total) by mouth daily as needed. If BP<100/60   Multiple Vitamins-Minerals (MENS MULTIVITAMIN PO) Take by mouth.   pantoprazole (PROTONIX) 20 MG tablet Take 1 tablet (20 mg total) by mouth daily.   potassium chloride SA (KLOR-CON M) 20 MEQ tablet Take 1 tablet (20 mEq total) by mouth daily. 227m BID x 1week, then 2079mdaily continuously   trimethoprim-polymyxin b (POLYTRIM) ophthalmic solution Place into the left eye.   [DISCONTINUED] GOODSENSE CLEARLAX 17 GM/SCOOP powder SMARTSIG:Gram(s) By Mouth (Patient not taking: Reported on 05/05/2022)   No facility-administered medications prior to visit.   Reviewed  past medical and social history.   ROS per HPI above      Objective:  BP 128/80 (BP Location: Right Arm, Patient Position: Sitting, Cuff Size: Small)   Pulse 67   Temp (!) 96.9 F (36.1 C) (Temporal)   Ht 6' (1.829 m)   Wt 145 lb 9.6 oz (66 kg)   SpO2 99%   BMI 19.75 kg/m      Physical Exam Vitals reviewed.  Cardiovascular:     Rate and Rhythm: Normal rate and regular rhythm.     Pulses: Normal pulses.     Heart sounds: Normal heart sounds.  Pulmonary:     Effort: Pulmonary effort is normal.     Breath sounds: Normal breath sounds.  Abdominal:      General: Bowel sounds are normal. There is no distension.     Palpations: Abdomen is soft.     Tenderness: There is no abdominal tenderness.  Musculoskeletal:     Right lower leg: No edema.     Left lower leg: No edema.  Neurological:     Mental Status: He is alert and oriented to person, place, and time.  Psychiatric:        Mood and Affect: Mood normal.        Behavior: Behavior normal.        Thought Content: Thought content normal.     Results for orders placed or performed in visit on 06/17/22  Direct LDL  Result Value Ref Range   Direct LDL 63.0 mg/dL  CBC  Result Value Ref Range   WBC 6.8 4.0 - 10.5 K/uL   RBC 3.92 (L) 4.22 - 5.81 Mil/uL   Platelets 344.0 150.0 - 400.0 K/uL   Hemoglobin 12.7 (L) 13.0 - 17.0 g/dL   HCT 37.8 (L) 39.0 - 52.0 %   MCV 96.4 78.0 - 100.0 fl   MCHC 33.7 30.0 - 36.0 g/dL   RDW 14.4 11.5 - 15.5 %  IBC + Ferritin  Result Value Ref Range   Iron 52 42 - 165 ug/dL   Transferrin 187.0 (L) 212.0 - 360.0 mg/dL   Saturation Ratios 19.9 (L) 20.0 - 50.0 %   Ferritin 85.9 22.0 - 322.0 ng/mL   TIBC 261.8 250.0 - 450.0 mcg/dL  Renal Function Panel  Result Value Ref Range   Sodium 138 135 - 145 mEq/L   Potassium 3.9 3.5 - 5.1 mEq/L   Chloride 101 96 - 112 mEq/L   CO2 27 19 - 32 mEq/L   Albumin 3.5 3.5 - 5.2 g/dL   BUN 21 6 - 23 mg/dL   Creatinine, Ser 0.85 0.40 - 1.50 mg/dL   Glucose, Bld 108 (H) 70 - 99 mg/dL   Phosphorus 2.9 2.3 - 4.6 mg/dL   GFR 87.01 >60.00 mL/min   Calcium 9.5 8.4 - 10.5 mg/dL  PSA, Medicare  Result Value Ref Range   PSA 0.33 0.10 - 4.00 ng/ml      Assessment & Plan:    Problem List Items Addressed This Visit       Cardiovascular and Mediastinum   Hypertension    Stable without medication No use of midodrine and florinef at this time BP Readings from Last 3 Encounters:  06/17/22 128/80  05/05/22 107/71  03/11/22 94/65        Neurogenic orthostatic hypotension (HCC) - Primary   Sick sinus syndrome (HCC)     Under the care of [place] cardiology: Dr. Gerarda Gunther and Dr.  Has remote pacemaker check  every 51month Denies any syncope        Other   History of rectal cancer    completed radiation therapy S/p lower resection of rectum Followed by Gen surg: Dr. LMia Creekand Rad. Oc: Dr. FThreasa Headsgerald ([facility] He denies any change in GI/ GU function Constipation improved with miralax Stable weight and appetite Wt Readings from Last 3 Encounters:  06/17/22 145 lb 9.6 oz (66 kg)  05/05/22 144 lb (65.3 kg)  01/28/22 155 lb 6.4 oz (70.5 kg)  He is scheduled for Colonoscopy in 07/2022 and CT ABD/pelvic 06/2022.      Hyperglycemia    Repeat hgbA1c      Relevant Orders   Hemoglobin A1c   Hypokalemia    Repeat BMP      Relevant Orders   Renal Function Panel (Completed)   Iron deficiency anemia    Repeat cbc and iron Denies any melena or hematochezia      Relevant Orders   CBC (Completed)   IBC + Ferritin (Completed)   Other Visit Diagnoses     Encounter for lipid screening for cardiovascular disease       Relevant Orders   Direct LDL (Completed)   Prostate cancer screening       Relevant Orders   PSA, Medicare (Completed)   Need for immunization against influenza       Relevant Orders   Flu Vaccine QUAD High Dose(Fluad) (Completed)      Return in about 6 months (around 12/16/2022) for Hypotension, Hyperglycemia.     CWilfred Lacy NP

## 2022-06-17 NOTE — Patient Instructions (Signed)
Go to lab Maintain current med doses Ok to get RSV, new COVID vaccine booster and shingrix vaccine. Separate COVID vaccine and shingrix by 2weeks.

## 2022-06-17 NOTE — Assessment & Plan Note (Addendum)
"  12/28/2020: Colonoscopy with Dr. 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   S/p lower resection of rectum completed radiation therapy Followed by Gen surg: Dr. Mia Creek and Rad. Oc: Dr. [name]) He denies any change in GI/ GU function Constipation improved with miralax Stable weight and appetite Wt Readings from Last 3 Encounters:  06/17/22 145 lb 9.6 oz (66 kg)  05/05/22 144 lb (65.3 kg)  01/28/22 155 lb 6.4 oz (70.5 kg)   He is scheduled for Colonoscopy in 07/2022 and CT ABD/pelvic 06/2022.

## 2022-06-17 NOTE — Assessment & Plan Note (Signed)
Repeat hgbA1c 

## 2022-06-17 NOTE — Assessment & Plan Note (Signed)
-   Repeat BMP

## 2022-06-17 NOTE — Assessment & Plan Note (Signed)
Stable without medication No use of midodrine and florinef at this time BP Readings from Last 3 Encounters:  06/17/22 128/80  05/05/22 107/71  03/11/22 94/65

## 2022-06-18 LAB — HEMOGLOBIN A1C
Est. average glucose Bld gHb Est-mCnc: 120 mg/dL
Hgb A1c MFr Bld: 5.8 % — ABNORMAL HIGH (ref 4.8–5.6)

## 2022-06-23 ENCOUNTER — Encounter: Payer: Self-pay | Admitting: Nurse Practitioner

## 2022-07-04 ENCOUNTER — Other Ambulatory Visit: Payer: Self-pay | Admitting: Nurse Practitioner

## 2022-07-04 DIAGNOSIS — E876 Hypokalemia: Secondary | ICD-10-CM

## 2022-07-07 ENCOUNTER — Telehealth: Payer: Self-pay | Admitting: Nurse Practitioner

## 2022-07-07 NOTE — Telephone Encounter (Signed)
Caller Name: carolyn ferdig Call back phone #: [phone]   MEDICATION(S):  potassium chloride SA (KLOR-CON M) 20 MEQ tablet  Days of Med Remaining:   Has the patient contacted their pharmacy (YES/NO)? yes What did pharmacy advise?   Preferred Pharmacy:  [facility], [place] - [address] Phone:  [phone]      ~~~Please advise patient/caregiver to allow 2-3 business days to process RX refills.

## 2022-07-08 ENCOUNTER — Other Ambulatory Visit: Payer: Self-pay

## 2022-07-08 DIAGNOSIS — E876 Hypokalemia: Secondary | ICD-10-CM

## 2022-07-08 MED ORDER — POTASSIUM CHLORIDE CRYS ER 20 MEQ PO TBCR: 20.0000 meq | EXTENDED_RELEASE_TABLET | Freq: Every day | ORAL | 1 refills | Status: DC

## 2022-07-08 NOTE — Telephone Encounter (Signed)
[name] wife checking on status of this message. Please advise, she is wanting a call when ever this script goes through. [facility]  [phone]

## 2022-07-18 ENCOUNTER — Other Ambulatory Visit: Payer: Self-pay | Admitting: Nurse Practitioner

## 2022-07-18 DIAGNOSIS — R195 Other fecal abnormalities: Secondary | ICD-10-CM

## 2022-07-18 DIAGNOSIS — D500 Iron deficiency anemia secondary to blood loss (chronic): Secondary | ICD-10-CM

## 2022-07-18 NOTE — Telephone Encounter (Signed)
Chart supports Rx Last OV: 05/2022 Next OV: 11/2022  

## 2022-07-28 NOTE — Telephone Encounter (Signed)
error 

## 2022-08-11 LAB — HM COLONOSCOPY

## 2022-08-29 NOTE — Telephone Encounter (Signed)
Na

## 2022-08-29 NOTE — Telephone Encounter (Signed)
Signing encounter to close this visit.

## 2022-09-05 ENCOUNTER — Other Ambulatory Visit: Payer: Self-pay | Admitting: Nurse Practitioner

## 2022-09-05 DIAGNOSIS — I6930 Unspecified sequelae of cerebral infarction: Secondary | ICD-10-CM

## 2022-09-05 DIAGNOSIS — K257 Chronic gastric ulcer without hemorrhage or perforation: Secondary | ICD-10-CM

## 2022-09-05 NOTE — Telephone Encounter (Signed)
Chart supports Rx Last OV: 05/2022 Next OV: 11/2022

## 2022-12-20 ENCOUNTER — Encounter: Payer: Self-pay | Admitting: Nurse Practitioner

## 2022-12-20 ENCOUNTER — Ambulatory Visit (INDEPENDENT_AMBULATORY_CARE_PROVIDER_SITE_OTHER): Payer: Medicare Other | Admitting: Nurse Practitioner

## 2022-12-20 VITALS — BP 110/70 | HR 64 | Temp 98.0°F | Resp 16 | Ht 72.0 in | Wt 153.0 lb

## 2022-12-20 DIAGNOSIS — D500 Iron deficiency anemia secondary to blood loss (chronic): Secondary | ICD-10-CM

## 2022-12-20 DIAGNOSIS — I495 Sick sinus syndrome: Secondary | ICD-10-CM | POA: Diagnosis not present

## 2022-12-20 DIAGNOSIS — R569 Unspecified convulsions: Secondary | ICD-10-CM

## 2022-12-20 DIAGNOSIS — G8114 Spastic hemiplegia affecting left nondominant side: Secondary | ICD-10-CM

## 2022-12-20 DIAGNOSIS — E876 Hypokalemia: Secondary | ICD-10-CM

## 2022-12-20 DIAGNOSIS — Z23 Encounter for immunization: Secondary | ICD-10-CM | POA: Diagnosis not present

## 2022-12-20 DIAGNOSIS — R739 Hyperglycemia, unspecified: Secondary | ICD-10-CM

## 2022-12-20 DIAGNOSIS — M109 Gout, unspecified: Secondary | ICD-10-CM

## 2022-12-20 DIAGNOSIS — Z993 Dependence on wheelchair: Secondary | ICD-10-CM

## 2022-12-20 DIAGNOSIS — I6930 Unspecified sequelae of cerebral infarction: Secondary | ICD-10-CM | POA: Diagnosis not present

## 2022-12-20 DIAGNOSIS — Z85828 Personal history of other malignant neoplasm of skin: Secondary | ICD-10-CM | POA: Insufficient documentation

## 2022-12-20 DIAGNOSIS — G903 Multi-system degeneration of the autonomic nervous system: Secondary | ICD-10-CM | POA: Diagnosis not present

## 2022-12-20 HISTORY — DX: Unspecified convulsions: R56.9

## 2022-12-20 LAB — URIC ACID: Uric Acid, Serum: 4.4 mg/dL (ref 4.0–7.8)

## 2022-12-20 MED ORDER — POTASSIUM CHLORIDE CRYS ER 20 MEQ PO TBCR: 20.0000 meq | EXTENDED_RELEASE_TABLET | Freq: Every day | ORAL | 1 refills | Status: DC

## 2022-12-20 MED ORDER — ATORVASTATIN CALCIUM 40 MG PO TABS: 40.0000 mg | ORAL_TABLET | Freq: Every day | ORAL | 3 refills | Status: DC

## 2022-12-20 NOTE — Assessment & Plan Note (Addendum)
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  He is taking ASA 81 mg, plavix 75mg  and atorvastatin 80 mg with no issues. In 2022 is had an epidose of altered mental status. TIA vs seizure suspected. Had eval by [facility] MRI Brain 07/2021: No acute intracranial pathology identified, specifically no acute infarct. Large remote right frontal lobe infarct. 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 Remote right basal ganglia hemorrhage.  EEG completed 07/2021: This is an abnormal EEG because of right hemispheric focal slowing which is suggestive of underlying structural or functional abnormality. Mild diffuse slowing which is nonspecific in nature and could be due to encephalopathy from toxic-metabolic, hypoxic- ischemic, postictal causes or neurodegenerative in origin. Clinical correlation is advised   Repeat CT head and MRI head 08/2021 due to fall with head trauma: no acute finding.  Wife denies any reoccurence

## 2022-12-20 NOTE — Progress Notes (Addendum)
Established Patient Visit  Patient: Tyler Richards   DOB: 04-Apr-1951   72 y.o. Male  MRN: [id number] Visit Date: 12/20/2022  Subjective:    Chief Complaint  Patient presents with   Medical Management of Chronic Issues    Refill for atorvastatin, plavix and klor-con     Accompanied by wife.  HPI Sick sinus syndrome ([place]) No syncope Pacemaker in place Under the care of [facility]  Neurogenic orthostatic hypotension (HCC) Stable BP at home per wife. Has not needed florinef x46months  Polyarticular gout No acute gout exacerbation Current use of allopurinol daily, no adverse side effects Maintain med dose Repeat BMP and uric acid  Iron deficiency anemia Followed by oncology Appt with oncology 12/2022, upcoming appt for CT ABD/pelvis. 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  He is taking ASA 81 mg, plavix 75mg  and atorvastatin 80 mg with no issues. In 2022 is had an epidose of altered mental status. TIA vs seizure suspected. Had eval by [facility] MRI Brain 07/2021: No acute intracranial pathology identified, specifically no acute infarct. Large remote right frontal lobe infarct. 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 Remote right basal ganglia hemorrhage.  EEG completed 07/2021: This is an abnormal EEG because of right hemispheric focal slowing which is suggestive of underlying structural or functional abnormality. Mild diffuse slowing which is nonspecific in nature and could be due to encephalopathy from toxic-metabolic, hypoxic- ischemic, postictal causes or neurodegenerative in origin. Clinical correlation is advised   Repeat CT head and MRI  head 08/2021 due to fall with head trauma: no acute finding.  Wife denies any reoccurence  Left spastic hemiparesis ([place]) Left arm, minimal extension of forearm and fingers Skin intake Wife assists with ADLs and skin care  Wt Readings from Last 3 Encounters:  12/20/22 153 lb (69.4 kg)  06/17/22 145 lb 9.6 oz (66 kg)  05/05/22 144 lb (65.3 kg)    BP Readings from Last 3 Encounters:  12/20/22 110/70  06/17/22 128/80  05/05/22 107/71    Reviewed medical, surgical, and social history today  Medications: Outpatient Medications Prior to Visit  Medication Sig   acetaminophen (TYLENOL) 325 MG tablet Take 650 mg by mouth every 6 (six) hours as needed for mild pain or headache.   allopurinol (ZYLOPRIM) 100 MG tablet TAKE 1 TABLET BY MOUTH EVERY DAY   ASPIRIN ADULT LOW STRENGTH 81 MG EC tablet Take 81 mg by mouth daily.   clopidogrel (PLAVIX) 75 MG tablet TAKE 1 TABLET BY MOUTH EVERY DAY   dibucaine (NUPERCAINAL) 1 % ointment Apply topically as needed. Apply to affected area   dicyclomine (BENTYL) 20 MG tablet Take by mouth.   FEROSUL 325 (65 Fe) MG tablet TAKE 1 TABLET BY MOUTH DAILY AFTER BREAKFAST   fludrocortisone (FLORINEF) 0.1 MG tablet Take 1 tablet (0.1 mg total) by mouth daily as needed. If BP <100/60   gabapentin (NEURONTIN) 300 MG capsule Take 1 capsule (300 mg total) by mouth 3 (three) times daily.   HYDROcodone-acetaminophen (NORCO/VICODIN) 5-325 MG tablet Take 0.5 tablets by mouth every 6 (six) hours as needed.   midodrine (PROAMATINE) 10 MG tablet  Take 1 tablet (10 mg total) by mouth daily as needed. If BP<100/60   Multiple Vitamins-Minerals (MENS MULTIVITAMIN PO) Take by mouth.   pantoprazole (PROTONIX) 20 MG tablet TAKE 1 TABLET BY MOUTH DAILY   trimethoprim-polymyxin b (POLYTRIM) ophthalmic solution Place into the left eye.   [DISCONTINUED] atorvastatin (LIPITOR) 40 MG tablet Take 1 tablet (40 mg total) by mouth daily.   [DISCONTINUED] potassium chloride SA (KLOR-CON M)  20 MEQ tablet Take 1 tablet (20 mEq total) by mouth daily. 44mEq BID x 1week, then 68mEq daily continuously   No facility-administered medications prior to visit.   Reviewed past medical and social history.   ROS per HPI above  Last CBC Lab Results  Component Value Date   WBC 6.8 06/17/2022   HGB 12.7 (L) 06/17/2022   HCT 37.8 (L) 06/17/2022   MCV 96.4 06/17/2022   MCH 26.5 12/22/2020   RDW 14.4 06/17/2022   PLT 344.0 [id number]   Last metabolic panel Lab Results  Component Value Date   GLUCOSE 108 (H) 06/17/2022   NA 138 06/17/2022   K 3.9 06/17/2022   CL 101 06/17/2022   CO2 27 06/17/2022   BUN 21 06/17/2022   CREATININE 0.85 06/17/2022   GFRNONAA >60 12/18/2020   CALCIUM 9.5 06/17/2022   PHOS 2.9 06/17/2022   PROT 6.3 (L) 12/18/2020   ALBUMIN 3.5 06/17/2022   BILITOT 0.4 12/18/2020   ALKPHOS 62 12/18/2020   AST 17 12/18/2020   ALT 14 12/18/2020   ANIONGAP 10 12/18/2020        Objective:  BP 110/70   Pulse 64   Temp 98 F (36.7 C) (Temporal)   Resp 16   Ht 6' (1.829 m)   Wt 153 lb (69.4 kg)   SpO2 99%   BMI 20.75 kg/m      Physical Exam Vitals reviewed.  Constitutional:      Comments: Wheelchair dependent, stands with 1 person assist and gait belt  Cardiovascular:     Rate and Rhythm: Normal rate and regular rhythm.     Pulses: Normal pulses.     Heart sounds: Normal heart sounds.  Pulmonary:     Effort: Pulmonary effort is normal.     Breath sounds: Normal breath sounds.  Musculoskeletal:     Right lower leg: No edema.     Left lower leg: No edema.  Neurological:     Mental Status: He is alert and oriented to person, place, and time.     Results for orders placed or performed in visit on 12/20/22  HM COLONOSCOPY  Result Value Ref Range   HM Colonoscopy See Report (in chart) See Report (in chart), Patient Reported      Assessment & Plan:    Problem List Items Addressed This Visit       Cardiovascular and Mediastinum   Neurogenic  orthostatic hypotension (HCC)    Stable BP at home per wife. Has not needed florinef x32months      Relevant Medications   atorvastatin (LIPITOR) 40 MG tablet   Sick sinus syndrome (HCC)    No syncope Pacemaker in place Under the care of [facility]      Relevant Medications   atorvastatin (LIPITOR) 40 MG tablet     Nervous and Auditory   Left spastic hemiparesis (HCC)    Left arm, minimal extension of forearm and fingers Skin intake Wife assists with ADLs and skin care        Musculoskeletal and Integument  Polyarticular gout    No acute gout exacerbation Current use of allopurinol daily, no adverse side effects Maintain med dose Repeat BMP and uric acid      Relevant Orders   Uric acid     Other   RESOLVED: Convulsions, unspecified convulsion type ([place]) - Primary   History of CVA with residual deficit    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  He is taking ASA 81 mg, plavix 75mg  and atorvastatin 80 mg with no issues. In 2022 is had an epidose of altered mental status. TIA vs seizure suspected. Had eval by [facility] MRI Brain 07/2021: No acute intracranial pathology identified, specifically no acute infarct. Large remote right frontal lobe infarct. 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 Remote right basal ganglia hemorrhage.  EEG completed 07/2021: This is an abnormal EEG because of right hemispheric focal slowing which is suggestive of underlying structural or functional abnormality. Mild diffuse slowing which is nonspecific in nature and could be due to encephalopathy from toxic-metabolic, hypoxic- ischemic, postictal causes or neurodegenerative in origin. Clinical correlation is advised   Repeat CT head and MRI head 08/2021 due to fall with head trauma: no acute finding.  Wife denies any reoccurence      Relevant Medications   atorvastatin (LIPITOR) 40 MG tablet   Other  Relevant Orders   Lipid panel   Hyperglycemia    Repeat hgbA1c      Relevant Orders   Hemoglobin A1c   Hypokalemia    Repeat BMP Maintain potassium supplement while waiting for lab results      Relevant Medications   potassium chloride SA (KLOR-CON M) 20 MEQ tablet   Iron deficiency anemia    Followed by oncology Appt with oncology 12/2022, upcoming appt for CT ABD/pelvis. No ABD pain or nausea Denies any melena or hematochezia Repeat cbc and iron      Relevant Orders   Iron, TIBC and Ferritin Panel   Wheelchair dependent   Other Visit Diagnoses     Immunization due       Relevant Orders   Pneumococcal conjugate vaccine 20-valent (Prevnar 20) (Completed)      Return in about 6 months (around 06/22/2023) for HTN, Hyperglycemia, hyperlipidemia (fasting).     Wilfred Lacy, NP

## 2022-12-20 NOTE — Assessment & Plan Note (Signed)
No acute gout exacerbation ?Current use of allopurinol daily, no adverse side effects ?Maintain med dose ?Repeat BMP and uric acid ?

## 2022-12-20 NOTE — Assessment & Plan Note (Signed)
Repeat hgbA1c 

## 2022-12-20 NOTE — Assessment & Plan Note (Addendum)
Followed by oncology Appt with oncology 12/2022, upcoming appt for CT ABD/pelvis. No ABD pain or nausea Denies any melena or hematochezia Repeat cbc and iron

## 2022-12-20 NOTE — Assessment & Plan Note (Signed)
Stable BP at home per wife. Has not needed florinef x48months

## 2022-12-20 NOTE — Patient Instructions (Signed)
Go to lab Maintain current medications Ok to get Tdap and COVID bosster vaccine at retail pharmacy

## 2022-12-20 NOTE — Assessment & Plan Note (Signed)
No syncope Pacemaker in place Under the care of [facility]

## 2022-12-20 NOTE — Assessment & Plan Note (Signed)
Repeat BMP Maintain potassium supplement while waiting for lab results

## 2022-12-20 NOTE — Assessment & Plan Note (Signed)
Left arm, minimal extension of forearm and fingers Skin intake Wife assists with ADLs and skin care

## 2022-12-21 ENCOUNTER — Other Ambulatory Visit (INDEPENDENT_AMBULATORY_CARE_PROVIDER_SITE_OTHER): Payer: Medicare Other

## 2022-12-21 DIAGNOSIS — I6930 Unspecified sequelae of cerebral infarction: Secondary | ICD-10-CM | POA: Diagnosis not present

## 2022-12-21 DIAGNOSIS — D500 Iron deficiency anemia secondary to blood loss (chronic): Secondary | ICD-10-CM

## 2022-12-21 DIAGNOSIS — R739 Hyperglycemia, unspecified: Secondary | ICD-10-CM

## 2022-12-21 LAB — LIPID PANEL
Cholesterol: 108 mg/dL (ref 0–200)
HDL: 33.8 mg/dL — ABNORMAL LOW (ref >39.00)
LDL Cholesterol: 55 mg/dL (ref 0–99)
NonHDL: 74.02
Total CHOL/HDL Ratio: 3
Triglycerides: 96 mg/dL (ref 0.0–149.0)
VLDL: 19.2 mg/dL (ref 0.0–40.0)

## 2022-12-21 LAB — HEMOGLOBIN A1C: Hgb A1c MFr Bld: 5.2 % (ref 4.6–6.5)

## 2022-12-22 LAB — IRON,TIBC AND FERRITIN PANEL
%SAT: 22 % (calc) (ref 20–48)
Ferritin: 59 ng/mL (ref 24–380)
Iron: 53 ug/dL (ref 50–180)
TIBC: 243 mcg/dL (calc) — ABNORMAL LOW (ref 250–425)

## 2022-12-22 NOTE — Progress Notes (Signed)
normal Follow instructions as discussed during office visit.

## 2022-12-27 ENCOUNTER — Telehealth: Payer: Self-pay | Admitting: Nurse Practitioner

## 2022-12-27 NOTE — Telephone Encounter (Signed)
Called patient to schedule Medicare Annual Wellness Visit (AWV). Left message for patient to call back and schedule Medicare Annual Wellness Visit (AWV).  Last date of AWV: 12/29/21  Please schedule an appointment at any time with [facility].  If any questions, please contact me at [phone].  Thank you ,  Shirlean Mylar [phone]

## 2022-12-27 NOTE — Telephone Encounter (Signed)
Contacted Tyler Richards to schedule their annual wellness visit. Appointment made for 01/02/23.  Tyler Richards [phone]

## 2023-01-02 ENCOUNTER — Telehealth: Payer: Self-pay

## 2023-01-02 NOTE — Telephone Encounter (Signed)
This nurse attempted to call patient three times for AWV. All calls went straight to voicemail. Message left that we will call again to reschedule.

## 2023-01-13 ENCOUNTER — Other Ambulatory Visit: Payer: Medicare Other

## 2023-01-13 ENCOUNTER — Other Ambulatory Visit: Payer: Self-pay | Admitting: Nurse Practitioner

## 2023-01-13 ENCOUNTER — Telehealth: Payer: Self-pay | Admitting: Nurse Practitioner

## 2023-01-13 DIAGNOSIS — M109 Gout, unspecified: Secondary | ICD-10-CM

## 2023-01-13 NOTE — Telephone Encounter (Signed)
Contacted Audrea Muscat to schedule their annual wellness visit. Call back at later date: 01/2023  Rudell Cobb AWV direct phone # [phone]

## 2023-01-17 ENCOUNTER — Other Ambulatory Visit: Payer: Medicare Other

## 2023-01-31 ENCOUNTER — Other Ambulatory Visit: Payer: Self-pay | Admitting: Nurse Practitioner

## 2023-01-31 DIAGNOSIS — M109 Gout, unspecified: Secondary | ICD-10-CM

## 2023-03-31 ENCOUNTER — Other Ambulatory Visit: Payer: Self-pay | Admitting: Nurse Practitioner

## 2023-03-31 DIAGNOSIS — E876 Hypokalemia: Secondary | ICD-10-CM

## 2023-04-05 DIAGNOSIS — C787 Secondary malignant neoplasm of liver and intrahepatic bile duct: Secondary | ICD-10-CM | POA: Insufficient documentation

## 2023-05-11 DIAGNOSIS — C19 Malignant neoplasm of rectosigmoid junction: Secondary | ICD-10-CM | POA: Insufficient documentation

## 2023-06-23 ENCOUNTER — Ambulatory Visit: Payer: Medicare Other | Admitting: Nurse Practitioner

## 2023-06-23 ENCOUNTER — Telehealth: Payer: Self-pay | Admitting: Nurse Practitioner

## 2023-06-23 NOTE — Telephone Encounter (Signed)
[name] wife called in and rescheduled pt's appt with [facility] for 06/23/23 due to weather, declined VV. I did not send a letter.

## 2023-06-23 NOTE — Telephone Encounter (Signed)
FYI

## 2023-06-27 ENCOUNTER — Other Ambulatory Visit: Payer: Self-pay | Admitting: Nurse Practitioner

## 2023-06-27 DIAGNOSIS — E876 Hypokalemia: Secondary | ICD-10-CM

## 2023-06-28 ENCOUNTER — Ambulatory Visit (INDEPENDENT_AMBULATORY_CARE_PROVIDER_SITE_OTHER): Payer: Medicare Other | Admitting: Nurse Practitioner

## 2023-06-28 ENCOUNTER — Encounter: Payer: Self-pay | Admitting: Nurse Practitioner

## 2023-06-28 VITALS — BP 98/60 | HR 63 | Resp 18 | Ht 72.0 in | Wt 144.0 lb

## 2023-06-28 DIAGNOSIS — E876 Hypokalemia: Secondary | ICD-10-CM | POA: Diagnosis not present

## 2023-06-28 DIAGNOSIS — K257 Chronic gastric ulcer without hemorrhage or perforation: Secondary | ICD-10-CM

## 2023-06-28 DIAGNOSIS — D500 Iron deficiency anemia secondary to blood loss (chronic): Secondary | ICD-10-CM

## 2023-06-28 DIAGNOSIS — Z23 Encounter for immunization: Secondary | ICD-10-CM | POA: Diagnosis not present

## 2023-06-28 DIAGNOSIS — I6930 Unspecified sequelae of cerebral infarction: Secondary | ICD-10-CM

## 2023-06-28 DIAGNOSIS — R739 Hyperglycemia, unspecified: Secondary | ICD-10-CM

## 2023-06-28 DIAGNOSIS — I10 Essential (primary) hypertension: Secondary | ICD-10-CM | POA: Diagnosis not present

## 2023-06-28 LAB — POCT GLYCOSYLATED HEMOGLOBIN (HGB A1C)
HbA1c POC (<> result, manual entry): 5.2 % (ref 4.0–5.6)
Hemoglobin A1C: 5.2 % (ref 4.0–5.6)

## 2023-06-28 MED ORDER — POTASSIUM CHLORIDE CRYS ER 20 MEQ PO TBCR
20.0000 meq | EXTENDED_RELEASE_TABLET | Freq: Every day | ORAL | 1 refills | Status: DC
Start: 2023-06-28 — End: 2023-12-27

## 2023-06-28 MED ORDER — PANTOPRAZOLE SODIUM 20 MG PO TBEC: 20.0000 mg | DELAYED_RELEASE_TABLET | Freq: Every day | ORAL | 3 refills | Status: DC

## 2023-06-28 MED ORDER — CLOPIDOGREL BISULFATE 75 MG PO TABS
75.0000 mg | ORAL_TABLET | Freq: Every day | ORAL | 3 refills | Status: DC
Start: 2023-06-28 — End: 2023-12-27

## 2023-06-28 NOTE — Progress Notes (Signed)
Established Patient Visit  Patient: Tyler Richards   DOB: 08/13/1951   72 y.o. Male  MRN: [id number] Visit Date: 06/28/2023  Subjective:    Chief Complaint  Patient presents with   Follow-up    No concerns  Flu shot    HPI Accompanied by Wife.  Hypertension Stable without medication No use of midodrine and florinef at this time BP Readings from Last 3 Encounters:  06/28/23 98/60  12/20/22 110/70  06/17/22 128/80   Reviewed labs completed by oncology 06/26/23: CMP-stable  Hyperglycemia Repeat hgbA1c-5.2% normal  Iron deficiency anemia Stable cbc per results from oncology 06/26/23 Maintain ferrous sulfate dose  Hypokalemia Stable potassium per labs completed by oncology 06/26/23 Med refill sent  BP Readings from Last 3 Encounters:  06/28/23 98/60  12/20/22 110/70  06/17/22 128/80    Wt Readings from Last 3 Encounters:  06/28/23 144 lb (65.3 kg)  12/20/22 153 lb (69.4 kg)  06/17/22 145 lb 9.6 oz (66 kg)    Reviewed medical, surgical, and social history today  Medications: Outpatient Medications Prior to Visit  Medication Sig   acetaminophen (TYLENOL) 325 MG tablet Take 650 mg by mouth every 6 (six) hours as needed for mild pain or headache.   allopurinol (ZYLOPRIM) 100 MG tablet TAKE 1 TABLET BY MOUTH EVERY DAY   ASPIRIN ADULT LOW STRENGTH 81 MG EC tablet Take 81 mg by mouth daily.   atorvastatin (LIPITOR) 40 MG tablet Take 1 tablet (40 mg total) by mouth daily.   dibucaine (NUPERCAINAL) 1 % ointment Apply topically as needed. Apply to affected area   dicyclomine (BENTYL) 20 MG tablet Take by mouth.   FEROSUL 325 (65 Fe) MG tablet TAKE 1 TABLET BY MOUTH DAILY AFTER BREAKFAST   fludrocortisone (FLORINEF) 0.1 MG tablet Take 1 tablet (0.1 mg total) by mouth daily as needed. If BP <100/60   gabapentin (NEURONTIN) 300 MG capsule TAKE 1 CAPSULE BY MOUTH 3 TIMES DAILY   HYDROcodone-acetaminophen (NORCO/VICODIN) 5-325 MG tablet Take 0.5 tablets by mouth  every 6 (six) hours as needed.   midodrine (PROAMATINE) 10 MG tablet Take 1 tablet (10 mg total) by mouth daily as needed. If BP<100/60   Multiple Vitamins-Minerals (MENS MULTIVITAMIN PO) Take by mouth.   trimethoprim-polymyxin b (POLYTRIM) ophthalmic solution Place into the left eye.   [DISCONTINUED] clopidogrel (PLAVIX) 75 MG tablet TAKE 1 TABLET BY MOUTH EVERY DAY   [DISCONTINUED] pantoprazole (PROTONIX) 20 MG tablet TAKE 1 TABLET BY MOUTH DAILY   [DISCONTINUED] potassium chloride SA (KLOR-CON M) 20 MEQ tablet Take 1 tablet (20 mEq total) by mouth daily.   No facility-administered medications prior to visit.   Reviewed past medical and social history.   ROS per HPI above      Objective:  BP 98/60 (BP Location: Right Arm, Patient Position: Sitting, Cuff Size: Normal)   Pulse 63   Resp 18   Ht 6' (1.829 m)   Wt 144 lb (65.3 kg)   SpO2 96%   BMI 19.53 kg/m      Physical Exam Vitals and nursing note reviewed.  Cardiovascular:     Rate and Rhythm: Normal rate and regular rhythm.     Pulses: Normal pulses.     Heart sounds: Normal heart sounds.  Pulmonary:     Effort: Pulmonary effort is normal.     Breath sounds: Normal breath sounds.  Musculoskeletal:     Right lower leg:  No edema.     Left lower leg: No edema.  Neurological:     Mental Status: He is alert and oriented to person, place, and time.     Results for orders placed or performed in visit on 06/28/23  POCT glycosylated hemoglobin (Hb A1C)  Result Value Ref Range   Hemoglobin A1C 5.2 4.0 - 5.6 %   HbA1c POC (<> result, manual entry) 5.2 4.0 - 5.6 %   HbA1c, POC (prediabetic range)     HbA1c, POC (controlled diabetic range)        Assessment & Plan:    Problem List Items Addressed This Visit     History of CVA with residual deficit   Relevant Medications   clopidogrel (PLAVIX) 75 MG tablet   Hyperglycemia    Repeat hgbA1c-5.2% normal      Relevant Orders   POCT glycosylated hemoglobin (Hb A1C)  (Completed)   Hypertension - Primary    Stable without medication No use of midodrine and florinef at this time BP Readings from Last 3 Encounters:  06/28/23 98/60  12/20/22 110/70  06/17/22 128/80   Reviewed labs completed by oncology 06/26/23: CMP-stable      Hypokalemia    Stable potassium per labs completed by oncology 06/26/23 Med refill sent      Relevant Medications   potassium chloride SA (KLOR-CON M) 20 MEQ tablet   Iron deficiency anemia    Stable cbc per results from oncology 06/26/23 Maintain ferrous sulfate dose      Other Visit Diagnoses     Immunization due       Relevant Orders   Flu Vaccine Trivalent High Dose (Fluad) (Completed)   Chronic gastric ulcer without hemorrhage and without perforation       Relevant Medications   pantoprazole (PROTONIX) 20 MG tablet      Return in about 6 months (around 12/27/2023) for HTN, hyperlipidemia (fasting).     Alysia Penna, NP

## 2023-06-28 NOTE — Patient Instructions (Signed)
Maintain current med doses

## 2023-06-28 NOTE — Assessment & Plan Note (Signed)
Stable cbc per results from oncology 06/26/23 Maintain ferrous sulfate dose

## 2023-06-28 NOTE — Assessment & Plan Note (Signed)
Repeat hgbA1c-5.2% normal

## 2023-06-28 NOTE — Assessment & Plan Note (Signed)
Stable without medication No use of midodrine and florinef at this time BP Readings from Last 3 Encounters:  06/28/23 98/60  12/20/22 110/70  06/17/22 128/80   Reviewed labs completed by oncology 06/26/23: CMP-stable

## 2023-06-28 NOTE — Assessment & Plan Note (Signed)
Stable potassium per labs completed by oncology 06/26/23 Med refill sent

## 2023-08-09 ENCOUNTER — Other Ambulatory Visit: Payer: Self-pay | Admitting: Nurse Practitioner

## 2023-08-09 DIAGNOSIS — R195 Other fecal abnormalities: Secondary | ICD-10-CM

## 2023-08-09 DIAGNOSIS — D500 Iron deficiency anemia secondary to blood loss (chronic): Secondary | ICD-10-CM

## 2023-12-26 ENCOUNTER — Other Ambulatory Visit: Payer: Self-pay | Admitting: Nurse Practitioner

## 2023-12-26 DIAGNOSIS — E876 Hypokalemia: Secondary | ICD-10-CM

## 2023-12-27 ENCOUNTER — Encounter: Payer: Self-pay | Admitting: Nurse Practitioner

## 2023-12-27 ENCOUNTER — Ambulatory Visit: Payer: Medicare Other | Admitting: Nurse Practitioner

## 2023-12-27 ENCOUNTER — Ambulatory Visit (INDEPENDENT_AMBULATORY_CARE_PROVIDER_SITE_OTHER): Payer: Medicare Other | Admitting: Nurse Practitioner

## 2023-12-27 VITALS — BP 102/60 | HR 60 | Temp 98.6°F | Wt 138.4 lb

## 2023-12-27 DIAGNOSIS — G8114 Spastic hemiplegia affecting left nondominant side: Secondary | ICD-10-CM

## 2023-12-27 DIAGNOSIS — I495 Sick sinus syndrome: Secondary | ICD-10-CM

## 2023-12-27 DIAGNOSIS — R739 Hyperglycemia, unspecified: Secondary | ICD-10-CM

## 2023-12-27 DIAGNOSIS — I2510 Atherosclerotic heart disease of native coronary artery without angina pectoris: Secondary | ICD-10-CM | POA: Diagnosis not present

## 2023-12-27 DIAGNOSIS — G903 Multi-system degeneration of the autonomic nervous system: Secondary | ICD-10-CM | POA: Diagnosis not present

## 2023-12-27 DIAGNOSIS — D500 Iron deficiency anemia secondary to blood loss (chronic): Secondary | ICD-10-CM | POA: Diagnosis not present

## 2023-12-27 DIAGNOSIS — I6930 Unspecified sequelae of cerebral infarction: Secondary | ICD-10-CM

## 2023-12-27 DIAGNOSIS — C787 Secondary malignant neoplasm of liver and intrahepatic bile duct: Secondary | ICD-10-CM

## 2023-12-27 DIAGNOSIS — M109 Gout, unspecified: Secondary | ICD-10-CM | POA: Diagnosis not present

## 2023-12-27 DIAGNOSIS — Z993 Dependence on wheelchair: Secondary | ICD-10-CM

## 2023-12-27 LAB — CBC WITH DIFFERENTIAL/PLATELET
Basophils Absolute: 0 10*3/uL (ref 0.0–0.1)
Basophils Relative: 0.5 % (ref 0.0–3.0)
Eosinophils Absolute: 0.1 10*3/uL (ref 0.0–0.7)
Eosinophils Relative: 1 % (ref 0.0–5.0)
HCT: 33.9 % — ABNORMAL LOW (ref 39.0–52.0)
Hemoglobin: 11.5 g/dL — ABNORMAL LOW (ref 13.0–17.0)
Lymphocytes Relative: 13.7 % (ref 12.0–46.0)
Lymphs Abs: 0.9 10*3/uL (ref 0.7–4.0)
MCHC: 34 g/dL (ref 30.0–36.0)
MCV: 97.9 fl (ref 78.0–100.0)
Monocytes Absolute: 0.5 10*3/uL (ref 0.1–1.0)
Monocytes Relative: 7.9 % (ref 3.0–12.0)
Neutro Abs: 5.3 10*3/uL (ref 1.4–7.7)
Neutrophils Relative %: 76.9 % (ref 43.0–77.0)
Platelets: 354 10*3/uL (ref 150.0–400.0)
RBC: 3.46 Mil/uL — ABNORMAL LOW (ref 4.22–5.81)
RDW: 14.5 % (ref 11.5–15.5)
WBC: 6.8 10*3/uL (ref 4.0–10.5)

## 2023-12-27 LAB — COMPREHENSIVE METABOLIC PANEL WITH GFR
ALT: 16 U/L (ref 0–53)
AST: 18 U/L (ref 0–37)
Albumin: 2.9 g/dL — ABNORMAL LOW (ref 3.5–5.2)
Alkaline Phosphatase: 98 U/L (ref 39–117)
BUN: 12 mg/dL (ref 6–23)
CO2: 26 meq/L (ref 19–32)
Calcium: 8.4 mg/dL (ref 8.4–10.5)
Chloride: 102 meq/L (ref 96–112)
Creatinine, Ser: 0.66 mg/dL (ref 0.40–1.50)
GFR: 92.92 mL/min (ref >60.00)
Glucose, Bld: 94 mg/dL (ref 70–99)
Potassium: 4 meq/L (ref 3.5–5.1)
Sodium: 137 meq/L (ref 135–145)
Total Bilirubin: 0.4 mg/dL (ref 0.2–1.2)
Total Protein: 6.1 g/dL (ref 6.0–8.3)

## 2023-12-27 LAB — LIPID PANEL
Cholesterol: 109 mg/dL (ref 0–200)
HDL: 36.2 mg/dL — ABNORMAL LOW (ref >39.00)
LDL Cholesterol: 63 mg/dL (ref 0–99)
NonHDL: 73.23
Total CHOL/HDL Ratio: 3
Triglycerides: 51 mg/dL (ref 0.0–149.0)
VLDL: 10.2 mg/dL (ref 0.0–40.0)

## 2023-12-27 LAB — URIC ACID: Uric Acid, Serum: 3.6 mg/dL — ABNORMAL LOW (ref 4.0–7.8)

## 2023-12-27 LAB — HEMOGLOBIN A1C: Hgb A1c MFr Bld: 5.2 % (ref 4.6–6.5)

## 2023-12-27 MED ORDER — GABAPENTIN 300 MG PO CAPS: 300.0000 mg | ORAL_CAPSULE | Freq: Three times a day (TID) | ORAL | 3 refills | Status: DC

## 2023-12-27 MED ORDER — ATORVASTATIN CALCIUM 40 MG PO TABS: 40.0000 mg | ORAL_TABLET | Freq: Every day | ORAL | 3 refills | Status: DC

## 2023-12-27 MED ORDER — ALLOPURINOL 100 MG PO TABS: 100.0000 mg | ORAL_TABLET | Freq: Every day | ORAL | 3 refills | Status: DC

## 2023-12-27 MED ORDER — CLOPIDOGREL BISULFATE 75 MG PO TABS: 75.0000 mg | ORAL_TABLET | Freq: Every day | ORAL | 3 refills | Status: DC

## 2023-12-27 NOTE — Assessment & Plan Note (Signed)
 Repeat hgbA1c

## 2023-12-27 NOTE — Patient Instructions (Signed)
 Go to lab Maintain Heart healthy diet and daily exercise. Maintain current medications.

## 2023-12-27 NOTE — Assessment & Plan Note (Signed)
 Under the care of [facility] Completed radiation therapy last month Repeat MRI ABDOMEN 11/2023: 1. 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 On today's examination there is very little or no internal hypoenhancement and diminished halo of hyperemia, consistent with expected evolution following radiation therapy. Redemonstrated mass of the posterior left lobe of the liver, hepatic segment III, which on today's examination demonstrates a significant degree of early arterial hyperenhancement not apparent on prior examination. Marked washout on remaining contrast phases  without evidence of capsular enhancement, lesion measuring 2.7 x 2.1 cm, previously 2.5 x 2.1 cm. The presence of early arterial hyper enhancement suggests hepatocellular carcinoma as a significant  differential consideration. No evidence of lymphadenopathy or metastatic disease elsewhere in the abdomen.

## 2023-12-27 NOTE — Assessment & Plan Note (Signed)
 Repeat lipid panel and hgbA1c BP at goal Denies any SOB or CP

## 2023-12-27 NOTE — Assessment & Plan Note (Signed)
 Repeat CBC and iron panel Maintain ferrous sulfate dose

## 2023-12-27 NOTE — Assessment & Plan Note (Signed)
 Stable BP at home per wife. No need for florinef No syncopal episode BP Readings from Last 3 Encounters:  12/27/23 102/60  06/28/23 98/60  12/20/22 110/70    Repeat CMP

## 2023-12-27 NOTE — Progress Notes (Signed)
 Established Patient Visit  Patient: Tyler Richards   DOB: July 02, 1951   73 y.o. Male  MRN: [id number] Visit Date: 12/27/2023  Subjective:    Chief Complaint  Patient presents with   Follow-up   HPI  Accompanied by wife.  CAD (coronary artery disease) Repeat lipid panel and hgbA1c BP at goal Denies any SOB or CP  Neurogenic orthostatic hypotension (HCC) Stable BP at home per wife. No need for florinef No syncopal episode BP Readings from Last 3 Encounters:  12/27/23 102/60  06/28/23 98/60  12/20/22 110/70    Repeat CMP   Sick sinus syndrome (HCC) No syncope Pacemaker in place Under the care of [facility]  Left spastic hemiparesis (HCC) Left arm, minimal extension of forearm and fingers Left LE weakness Can stand with 1 person assist Skin intake Wife assists with ADLs and skin care Use of wheelchair  Polyarticular gout No acute gout exacerbation Current use of allopurinol daily, no adverse side effects Maintain med dose Repeat CMP and uric acid  Hyperglycemia Repeat hgbA1c  Iron deficiency anemia Repeat CBC and iron panel Maintain ferrous sulfate dose  Metastasis to liver [facility]) Under the care of [facility] Completed radiation therapy last month Repeat MRI ABDOMEN 11/2023: 1. 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 On today's examination there is very little or no internal hypoenhancement and diminished halo of hyperemia, consistent with expected evolution following radiation therapy. Redemonstrated mass of the posterior left lobe of the liver, hepatic segment III, which on today's examination demonstrates a significant degree of early arterial hyperenhancement not apparent on prior examination. Marked washout on remaining contrast phases  without evidence of capsular enhancement, lesion measuring  2.7 x 2.1 cm, previously 2.5 x 2.1 cm. The presence of early arterial hyper enhancement suggests hepatocellular carcinoma as a significant  differential consideration. No evidence of lymphadenopathy or metastatic disease elsewhere in the abdomen.      Wt Readings from Last 3 Encounters:  12/27/23 138 lb 6.4 oz (62.8 kg)  06/28/23 144 lb (65.3 kg)  12/20/22 153 lb (69.4 kg)    Reviewed medical, surgical, and social history today  Medications: Outpatient Medications Prior to Visit  Medication Sig   acetaminophen (TYLENOL) 325 MG tablet Take 650 mg by mouth every 6 (six) hours as needed for mild pain or headache.   ASPIRIN ADULT LOW STRENGTH 81 MG EC tablet Take 81 mg by mouth daily.   dibucaine (NUPERCAINAL) 1 % ointment Apply topically as needed. Apply to affected area   FEROSUL 325 (65 Fe) MG tablet TAKE 1 TABLET BY MOUTH EVERY MORNING AFTER BREAKFAST   fludrocortisone (FLORINEF) 0.1 MG tablet Take 1 tablet (0.1 mg total) by mouth daily as needed. If BP <100/60   HYDROcodone-acetaminophen (NORCO/VICODIN) 5-325 MG tablet Take 0.5 tablets by mouth every 6 (six) hours as needed.   midodrine (PROAMATINE) 10 MG tablet Take 1 tablet (10 mg total) by mouth daily as needed. If BP<100/60   Multiple Vitamins-Minerals (MENS MULTIVITAMIN PO) Take by mouth.   pantoprazole (PROTONIX) 20 MG tablet Take 1 tablet (20 mg total) by mouth daily.   trimethoprim-polymyxin b (POLYTRIM) ophthalmic solution Place 1 drop into the left eye as needed.   [DISCONTINUED] allopurinol (ZYLOPRIM) 100 MG tablet TAKE 1 TABLET BY MOUTH EVERY DAY   [  DISCONTINUED] atorvastatin (LIPITOR) 40 MG tablet Take 1 tablet (40 mg total) by mouth daily.   [DISCONTINUED] clopidogrel (PLAVIX) 75 MG tablet Take 1 tablet (75 mg total) by mouth daily.   [DISCONTINUED] gabapentin (NEURONTIN) 300 MG capsule TAKE 1 CAPSULE BY MOUTH 3 TIMES DAILY   [DISCONTINUED] potassium chloride SA (KLOR-CON M) 20 MEQ tablet Take 1 tablet (20 mEq total) by  mouth daily.   dicyclomine (BENTYL) 20 MG tablet Take by mouth.   No facility-administered medications prior to visit.   Reviewed past medical and social history.   ROS per HPI above      Objective:  BP 102/60 (BP Location: Right Arm, Patient Position: Sitting, Cuff Size: Normal)   Pulse 60   Temp 98.6 F (37 C) (Temporal)   Wt 138 lb 6.4 oz (62.8 kg)   SpO2 98%   BMI 18.77 kg/m      Physical Exam Vitals and nursing note reviewed.  Eyes:     General: No scleral icterus. Cardiovascular:     Rate and Rhythm: Normal rate and regular rhythm.     Pulses: Normal pulses.     Heart sounds: Normal heart sounds.  Pulmonary:     Effort: Pulmonary effort is normal.     Breath sounds: Normal breath sounds.  Abdominal:     General: There is no distension.     Palpations: Abdomen is soft.     Tenderness: There is no abdominal tenderness.  Musculoskeletal:     Right lower leg: No edema.     Left lower leg: No edema.  Neurological:     Mental Status: He is alert and oriented to person, place, and time.  Psychiatric:        Mood and Affect: Mood normal.        Behavior: Behavior normal.        Thought Content: Thought content normal.     No results found for any visits on 12/27/23.    Assessment & Plan:    Problem List Items Addressed This Visit     CAD (coronary artery disease)   Repeat lipid panel and hgbA1c BP at goal Denies any SOB or CP      Relevant Medications   atorvastatin (LIPITOR) 40 MG tablet   clopidogrel (PLAVIX) 75 MG tablet   Other Relevant Orders   Lipid panel   History of CVA with residual deficit   Relevant Medications   atorvastatin (LIPITOR) 40 MG tablet   clopidogrel (PLAVIX) 75 MG tablet   Hyperglycemia   Repeat hgbA1c      Relevant Orders   Hemoglobin A1c   Iron deficiency anemia   Repeat CBC and iron panel Maintain ferrous sulfate dose      Relevant Orders   CBC with Differential/Platelet   Iron, TIBC and Ferritin Panel   Left  spastic hemiparesis (HCC)   Left arm, minimal extension of forearm and fingers Left LE weakness Can stand with 1 person assist Skin intake Wife assists with ADLs and skin care Use of wheelchair      Relevant Medications   atorvastatin (LIPITOR) 40 MG tablet   clopidogrel (PLAVIX) 75 MG tablet   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 On today's examination there  is very little or no internal hypoenhancement and diminished halo of hyperemia, consistent with expected evolution following radiation therapy. Redemonstrated mass of the posterior left lobe of the liver, hepatic segment III, which on today's examination demonstrates a significant degree of early arterial hyperenhancement not apparent on prior examination. Marked washout on remaining contrast phases  without evidence of capsular enhancement, lesion measuring 2.7 x 2.1 cm, previously 2.5 x 2.1 cm. The presence of early arterial hyper enhancement suggests hepatocellular carcinoma as a significant  differential consideration. No evidence of lymphadenopathy or metastatic disease elsewhere in the abdomen.        Relevant Medications   allopurinol (ZYLOPRIM) 100 MG tablet   Neurogenic orthostatic hypotension (HCC) - Primary   Stable BP at home per wife. No need for florinef No syncopal episode BP Readings from Last 3 Encounters:  12/27/23 102/60  06/28/23 98/60  12/20/22 110/70    Repeat CMP       Relevant Medications   atorvastatin (LIPITOR) 40 MG tablet   Other Relevant Orders   Comprehensive metabolic panel with GFR   Polyarticular gout   No acute gout exacerbation Current use of allopurinol daily, no adverse side effects Maintain med dose Repeat CMP and  uric acid      Relevant Medications   allopurinol (ZYLOPRIM) 100 MG tablet   gabapentin (NEURONTIN) 300 MG capsule   Other Relevant Orders   Uric acid   Sick sinus syndrome (HCC)   No syncope Pacemaker in place Under the care of [facility]      Relevant Medications   atorvastatin (LIPITOR) 40 MG tablet   Wheelchair dependent   Return in about 6 months (around 06/27/2024) for HTN, hyperlipidemia (fasting).     Alysia Penna, NP

## 2023-12-27 NOTE — Assessment & Plan Note (Signed)
 No acute gout exacerbation Current use of allopurinol daily, no adverse side effects Maintain med dose Repeat CMP and uric acid

## 2023-12-27 NOTE — Assessment & Plan Note (Signed)
 Left arm, minimal extension of forearm and fingers Left LE weakness Can stand with 1 person assist Skin intake Wife assists with ADLs and skin care Use of wheelchair

## 2023-12-27 NOTE — Assessment & Plan Note (Signed)
No syncope Pacemaker in place Under the care of [facility]

## 2023-12-28 LAB — IRON,TIBC AND FERRITIN PANEL
%SAT: 9 % — ABNORMAL LOW (ref 20–48)
Ferritin: 76 ng/mL (ref 24–380)
Iron: 21 ug/dL — ABNORMAL LOW (ref 50–180)
TIBC: 229 ug/dL — ABNORMAL LOW (ref 250–425)

## 2024-01-11 LAB — BASIC METABOLIC PANEL WITH GFR
BUN: 12 (ref 4–21)
CO2: 27 — AB (ref 13–22)
Chloride: 100 (ref 99–108)
Creatinine: 0.7 (ref 0.6–1.3)
Glucose: 111
Potassium: 3.8 meq/L (ref 3.5–5.1)
Sodium: 136 — AB (ref 137–147)

## 2024-01-11 LAB — COMPREHENSIVE METABOLIC PANEL WITH GFR
Calcium: 8.3 — AB (ref 8.7–10.7)
eGFR: 99

## 2024-01-11 LAB — CBC AND DIFFERENTIAL
HCT: 33 — AB (ref 41–53)
Hemoglobin: 10.7 — AB (ref 13.5–17.5)
Neutrophils Absolute: 5.94
Platelets: 217 10*3/uL (ref 150–400)
WBC: 7.2

## 2024-01-11 LAB — PROTIME-INR: Protime: 6 — AB (ref 10.0–13.8)

## 2024-01-11 LAB — CBC: RBC: 3.34 — AB (ref 3.87–5.11)

## 2024-01-11 LAB — POCT INR: INR: 1 (ref 0.80–1.20)

## 2024-01-11 LAB — HEPATIC FUNCTION PANEL
ALT: 14 U/L (ref 10–40)
AST: 22 (ref 14–40)
Alkaline Phosphatase: 106 (ref 25–125)

## 2024-01-17 ENCOUNTER — Telehealth: Payer: Self-pay | Admitting: Nurse Practitioner

## 2024-01-17 ENCOUNTER — Telehealth: Payer: Self-pay

## 2024-01-17 NOTE — Telephone Encounter (Signed)
 Noted. TOC 1st attempt made.

## 2024-01-17 NOTE — Transitions of Care (Post Inpatient/ED Visit) (Signed)
   01/17/2024  Name: TOMAZ JANIS MRN: [id number] DOB: 01/15/1951  Today's TOC FU Call Status: Today's TOC FU Call Status:: Unsuccessful Call (1st Attempt) Unsuccessful Call (1st Attempt) Date: 01/17/24  Attempted to reach the patient regarding the most recent Inpatient/ED visit.  Follow Up Plan: Additional outreach attempts will be made to reach the patient to complete the Transitions of Care (Post Inpatient/ED visit) call.   Signature  Kirby Peoples, RMA

## 2024-01-17 NOTE — Telephone Encounter (Signed)
 Pt has a hosp f/up with Soyla Duverney on 01/30/24, he was admitted.

## 2024-01-18 ENCOUNTER — Telehealth: Payer: Self-pay

## 2024-01-18 NOTE — Transitions of Care (Post Inpatient/ED Visit) (Signed)
   01/18/2024  Name: Tyler Richards MRN: [id number] DOB: 11/29/1950  Today's TOC FU Call Status: Today's TOC FU Call Status:: Unsuccessful Call (1st Attempt) Unsuccessful Call (1st Attempt) Date: 01/18/24  Attempted to reach the patient regarding the most recent Inpatient/ED visit.  Follow Up Plan: Additional outreach attempts will be made to reach the patient to complete the Transitions of Care (Post Inpatient/ED visit) call.   Orpha Blade, RN, BSN, CEN [employer]- Transition of Care Team.  [facility] [phone]

## 2024-01-19 ENCOUNTER — Telehealth: Payer: Self-pay

## 2024-01-19 NOTE — Transitions of Care (Post Inpatient/ED Visit) (Signed)
   01/19/2024  Name: Tyler Richards MRN: [id number] DOB: 12-03-1950  Today's TOC FU Call Status:    Attempted to reach the patient regarding the most recent Inpatient/ED visit.  Follow Up Plan: Additional outreach attempts will be made to reach the patient to complete the Transitions of Care (Post Inpatient/ED visit) call.   Orpha Blade, RN, BSN, CEN [employer]- Transition of Care Team.  [facility] [phone]

## 2024-01-22 ENCOUNTER — Telehealth: Payer: Self-pay

## 2024-01-22 NOTE — Transitions of Care (Post Inpatient/ED Visit) (Signed)
   01/22/2024  Name: [name] MRN: [id number] DOB: 02-08-1951  Today's TOC FU Call Status: Today's TOC FU Call Status:: Unsuccessful Call (3rd Attempt) Unsuccessful Call (2nd Attempt) Date: 01/19/24 Unsuccessful Call (3rd Attempt) Date: 01/22/24  Attempted to reach the patient regarding the most recent Inpatient/ED visit. Placed call to patient and wife answered. She was looking for an MD phone number for [facility].  I explained the reason for my call and call dropped. I called back and left a voicemail.  Follow Up Plan: No further outreach attempts will be made at this time. We have been unable to contact the patient.   Orpha Blade, RN, BSN, CEN [employer]- Transition of Care Team.  [facility] [phone]

## 2024-01-30 ENCOUNTER — Telehealth: Payer: Self-pay

## 2024-01-30 ENCOUNTER — Ambulatory Visit (INDEPENDENT_AMBULATORY_CARE_PROVIDER_SITE_OTHER): Admitting: Nurse Practitioner

## 2024-01-30 ENCOUNTER — Encounter: Payer: Self-pay | Admitting: Nurse Practitioner

## 2024-01-30 VITALS — BP 102/64 | HR 68 | Temp 98.0°F | Ht 72.0 in | Wt 153.8 lb

## 2024-01-30 DIAGNOSIS — D500 Iron deficiency anemia secondary to blood loss (chronic): Secondary | ICD-10-CM

## 2024-01-30 DIAGNOSIS — R600 Localized edema: Secondary | ICD-10-CM

## 2024-01-30 DIAGNOSIS — K625 Hemorrhage of anus and rectum: Secondary | ICD-10-CM

## 2024-01-30 DIAGNOSIS — G903 Multi-system degeneration of the autonomic nervous system: Secondary | ICD-10-CM

## 2024-01-30 DIAGNOSIS — K7469 Other cirrhosis of liver: Secondary | ICD-10-CM | POA: Insufficient documentation

## 2024-01-30 DIAGNOSIS — G8114 Spastic hemiplegia affecting left nondominant side: Secondary | ICD-10-CM

## 2024-01-30 MED ORDER — FUROSEMIDE 20 MG PO TABS: 20.0000 mg | ORAL_TABLET | ORAL | 0 refills | Status: DC

## 2024-01-30 MED ORDER — PANTOPRAZOLE SODIUM 40 MG PO TBEC
40.0000 mg | DELAYED_RELEASE_TABLET | Freq: Two times a day (BID) | ORAL | 0 refills | Status: DC
Start: 2024-01-30 — End: 2024-03-19

## 2024-01-30 NOTE — Telephone Encounter (Signed)
 Copied from CRM [id number]. Topic: Clinical - Medication Question >> Jan 30, 2024  3:00 PM [name] wrote: Reason for CRM: ANGELA [id number] [facility] CALLED ABOUT THE PATIENTS MEDS AND WOULD LIKE [place]TE OR HER NURSE TO CALL HER BACK

## 2024-01-30 NOTE — Assessment & Plan Note (Addendum)
 Secondary to abscess: CT Abd/pelvis 01/28/2024: Presacral abscess again noted. Similar size. Cannot exclude anastomotic dehiscence.  Today he Reports resolved rectal bleed since hospital discharge on 01/17/2024. No fever, no ABDOMEN pain or nausea or anorexia. He has completed oral abx course (zyvox 600mg  BIDx10days) and protonix  40mg  BID x 30days). Plavix  and ASA on hold I notified [facility] cardiology-Dr. Leeland Pugh, Asif via telephone I left voice message to notify Dr. Verdene Givens bout discontinuation of plavix  Appointment with GI 02/21/2024. Reviewed labs from [facility]: improved cbc and stable CMP Reviewed radiology report.

## 2024-01-30 NOTE — Telephone Encounter (Signed)
 Called Angela back from Dr. Thora Flint office. She stated that she was calling because of voice message left and that Dr. Gorge Laud will be out of the office until next week that she wanted to get the message to the person covering his messages. She also stated that they have not seen patient in almost a year. I infomed her that per Charlotte's note that she wanted to let Dr. Gorge Laud know that the Clopidogrel  (Plavix ) has been discontinued. She thanked me for calling her back and asked if she could give our office a call back if there are any further questions. I let her know that she can.

## 2024-01-30 NOTE — Assessment & Plan Note (Addendum)
 Anorexia, distended ABDOMEN led to hospitalization 4/16 to 4/23 Hx of colorectal cancer with mets to liver Per CT ABDOMEN/pelvis 01/28/2024: Findings consistent with cirrhosis. Liver lesions again noted. Right-sided lesion reportedly treated malignancy. Left-sided lesion suspicious for potential malignant lesion. Cannot exclude complex cyst upper benign lesion. Large volume of ascites Normal LFTs and bilirubin, low albumin during hospitalization No change in repeat echo 01/12/2024: (no change from 08/2023): EF 60-65%, mild Left ventricular hypertrophy, mild diastolic dysfunction, normal wall motion, ICD leads intact, normal aorta, normal pericardium, normal valve function. Today he has persistent distended ABDOMEN, but soft. Associated with bilateral LE edema and 17lbs weight gain in last 75month He denies any ABDOMEN pain or nausea or blood in stool or No jaundice or CP or PND or palpitation Normal BP today but has hx of neurogenic orthostatic hypotension He has appointment with GI 02/21/2024 for paracentesis  I notified [facility] cardiology-Dr. Leeland Pugh, Asif via telephone Sent furosemide EOD x 3tabs Advised to Monitor BP daily in Am and PM, Stop furosemide if BP <100/60, and Use knee high compression stocking daily, off at bedtime. F/up in 1week, will repeat CMP

## 2024-01-30 NOTE — Progress Notes (Signed)
 Established Patient Visit  Patient: Tyler Richards   DOB: 24-May-1951   73 y.o. Male  MRN: [id number] Visit Date: 01/30/2024  Subjective:    Chief Complaint  Patient presents with   Hospital Follow up    Recent hospital stay for rectal bleeding/ bacteria infection    HPI Other cirrhosis of liver (HCC) Anorexia, distended ABDOMEN led to hospitalization 4/16 to 4/23 Hx of colorectal cancer with mets to liver Per CT ABDOMEN/pelvis 01/28/2024: Findings consistent with cirrhosis. Liver lesions again noted. Right-sided lesion reportedly treated malignancy. Left-sided lesion suspicious for potential malignant lesion. Cannot exclude complex cyst upper benign lesion. Large volume of ascites Normal LFTs and bilirubin, low albumin during hospitalization No change in repeat echo 01/12/2024: (no change from 08/2023): EF 60-65%, mild Left ventricular hypertrophy, mild diastolic dysfunction, normal wall motion, ICD leads intact, normal aorta, normal pericardium, normal valve function. Today he has persistent distended ABDOMEN, but soft. Associated with bilateral LE edema and 17lbs weight gain in last 12month He denies any ABDOMEN pain or nausea or blood in stool or No jaundice or CP or PND or palpitation Normal BP today but has hx of neurogenic orthostatic hypotension He has appointment with GI 02/21/2024 for paracentesis  I notified [facility] cardiology-Dr. Leeland Pugh, Asif via telephone Sent furosemide EOD x 3tabs Advised to Monitor BP daily in Am and PM, Stop furosemide if BP <100/60, and Use knee high compression stocking daily, off at bedtime. F/up in 1week, will repeat CMP  Gastrointestinal hemorrhage Secondary to abscess: CT Abd/pelvis 01/28/2024: Presacral abscess again noted. Similar size. Cannot exclude anastomotic dehiscence.  Today he Reports resolved rectal bleed since hospital discharge on 01/17/2024. No fever, no ABDOMEN pain or nausea or anorexia. He has completed oral abx  course (zyvox 600mg  BIDx10days) and protonix  40mg  BID x 30days). Plavix  and ASA on hold I notified [facility] cardiology-Dr. Leeland Pugh, Asif via telephone I left voice message to notify Dr. Verdene Givens bout discontinuation of plavix  Appointment with GI 02/21/2024. Reviewed labs from [facility]: improved cbc and stable CMP Reviewed radiology report.  Wt Readings from Last 3 Encounters:  01/30/24 153 lb 12.8 oz (69.8 kg)  12/27/23 138 lb 6.4 oz (62.8 kg)  06/28/23 144 lb (65.3 kg)    Reviewed medical, surgical, and social history today  Medications: Outpatient Medications Prior to Visit  Medication Sig   acetaminophen  (TYLENOL ) 325 MG tablet Take 650 mg by mouth every 6 (six) hours as needed for mild pain or headache.   allopurinol  (ZYLOPRIM ) 100 MG tablet Take 1 tablet (100 mg total) by mouth daily.   dibucaine (NUPERCAINAL) 1 % ointment Apply topically as needed. Apply to affected area   dicyclomine (BENTYL) 20 MG tablet Take by mouth.   FEROSUL 325 (65 Fe) MG tablet TAKE 1 TABLET BY MOUTH EVERY MORNING AFTER BREAKFAST   fludrocortisone  (FLORINEF ) 0.1 MG tablet Take 1 tablet (0.1 mg total) by mouth daily as needed. If BP <100/60   gabapentin  (NEURONTIN ) 300 MG capsule Take 1 capsule (300 mg total) by mouth 3 (three) times daily.   HYDROcodone-acetaminophen  (NORCO/VICODIN) 5-325 MG tablet Take 0.5 tablets by mouth every 6 (six) hours as needed.   midodrine  (PROAMATINE ) 10 MG tablet Take 1 tablet (10 mg total) by mouth daily as needed. If BP<100/60   Multiple Vitamins-Minerals (MENS MULTIVITAMIN PO) Take by mouth.   potassium chloride  SA (KLOR-CON  M) 20 MEQ tablet TAKE 1 TABLET BY MOUTH  EACH DAY   trimethoprim -polymyxin b (POLYTRIM) ophthalmic solution Place 1 drop into the left eye as needed.   [DISCONTINUED] ASPIRIN  ADULT LOW STRENGTH 81 MG EC tablet Take 81 mg by mouth daily.   [DISCONTINUED] atorvastatin  (LIPITOR) 40 MG tablet Take 1 tablet (40 mg total) by mouth daily.    [DISCONTINUED] clopidogrel  (PLAVIX ) 75 MG tablet Take 1 tablet (75 mg total) by mouth daily.   [DISCONTINUED] pantoprazole  (PROTONIX ) 20 MG tablet Take 40 mg by mouth.   atorvastatin  (LIPITOR) 40 MG tablet Take 1 tablet (40 mg total) by mouth daily.   [DISCONTINUED] pantoprazole  (PROTONIX ) 20 MG tablet Take 1 tablet (20 mg total) by mouth daily. (Patient not taking: Reported on 01/30/2024)   No facility-administered medications prior to visit.   Reviewed past medical and social history.   ROS per HPI above      Objective:  BP 102/64 (BP Location: Right Arm, Patient Position: Sitting, Cuff Size: Normal)   Pulse 68   Temp 98 F (36.7 C) (Temporal)   Ht 6' (1.829 m)   Wt 153 lb 12.8 oz (69.8 kg)   SpO2 97%   BMI 20.86 kg/m      Physical Exam Vitals and nursing note reviewed.  Constitutional:      General: He is not in acute distress. Cardiovascular:     Rate and Rhythm: Normal rate and regular rhythm.     Pulses: Normal pulses.     Heart sounds: Normal heart sounds.  Pulmonary:     Effort: Pulmonary effort is normal.     Breath sounds: Normal breath sounds.  Abdominal:     General: Bowel sounds are normal. There is distension.     Palpations: Abdomen is soft. There is no mass.     Tenderness: There is no abdominal tenderness. There is no guarding or rebound.  Neurological:     Mental Status: He is alert and oriented to person, place, and time.     No results found for any visits on 01/30/24.    Assessment & Plan:    Problem List Items Addressed This Visit     Gastrointestinal hemorrhage   Secondary to abscess: CT Abd/pelvis 01/28/2024: Presacral abscess again noted. Similar size. Cannot exclude anastomotic dehiscence.  Today he Reports resolved rectal bleed since hospital discharge on 01/17/2024. No fever, no ABDOMEN pain or nausea or anorexia. He has completed oral abx course (zyvox 600mg  BIDx10days) and protonix  40mg  BID x 30days). Plavix  and ASA on hold I notified  [facility] cardiology-Dr. Leeland Pugh, Asif via telephone I left voice message to notify Dr. Verdene Givens bout discontinuation of plavix  Appointment with GI 02/21/2024. Reviewed labs from [facility]: improved cbc and stable CMP Reviewed radiology report.      Relevant Medications   pantoprazole  (PROTONIX ) 40 MG tablet   Iron deficiency anemia   Relevant Medications   pantoprazole  (PROTONIX ) 40 MG tablet   Left spastic hemiparesis (HCC)   Relevant Medications   atorvastatin  (LIPITOR) 40 MG tablet   Neurogenic orthostatic hypotension (HCC)   Relevant Medications   furosemide (LASIX) 20 MG tablet   atorvastatin  (LIPITOR) 40 MG tablet   Other cirrhosis of liver (HCC)   Anorexia, distended ABDOMEN led to hospitalization 4/16 to 4/23 Hx of colorectal cancer with mets to liver Per CT ABDOMEN/pelvis 01/28/2024: Findings consistent with cirrhosis. Liver lesions again noted. Right-sided lesion reportedly treated malignancy. Left-sided lesion suspicious for potential malignant lesion. Cannot exclude complex cyst upper benign lesion. Large volume of ascites Normal LFTs and  bilirubin, low albumin during hospitalization No change in repeat echo 01/12/2024: (no change from 08/2023): EF 60-65%, mild Left ventricular hypertrophy, mild diastolic dysfunction, normal wall motion, ICD leads intact, normal aorta, normal pericardium, normal valve function. Today he has persistent distended ABDOMEN, but soft. Associated with bilateral LE edema and 17lbs weight gain in last 77month He denies any ABDOMEN pain or nausea or blood in stool or No jaundice or CP or PND or palpitation Normal BP today but has hx of neurogenic orthostatic hypotension He has appointment with GI 02/21/2024 for paracentesis  I notified [facility] cardiology-Dr. Leeland Pugh, Asif via telephone Sent furosemide EOD x 3tabs Advised to Monitor BP daily in Am and PM, Stop furosemide if BP <100/60, and Use knee high compression stocking daily, off  at bedtime. F/up in 1week, will repeat CMP      Other Visit Diagnoses       Localized edema    -  Primary   Relevant Medications   furosemide (LASIX) 20 MG tablet      Return in about 1 week (around 02/06/2024) for edema and anemia (repeat CMP and CBC).     Kathrene Parents, NP

## 2024-01-30 NOTE — Patient Instructions (Signed)
 Start furosemide 10mg  every other day. Monitor BP daily in Am and PM Stop furosemide if BP <100/60 Use knee high compression stocking daily, off at bedtime.

## 2024-02-05 NOTE — Telephone Encounter (Signed)
 Copied from CRM [id number]. Topic: Clinical - Medication Question >> Jan 30, 2024  3:00 PM [name] wrote: Reason for CRM: Shelvy Dickens [id number] [facility] CALLED ABOUT THE PATIENTS MEDS AND WOULD LIKE [place] OR HER NURSE TO CALL HER BACK >> Feb 05, 2024 10:56 AM [name] wrote: Shelvy Dickens  WITH DR ETHAN YUN [facility] is calling to see if patient was still taking Aspirin  81MG  since he was taken off plavix .

## 2024-02-05 NOTE — Telephone Encounter (Signed)
 Copied from CRM [id number]. Topic: Clinical - Medication Question >> Jan 30, 2024  3:00 PM [name] wrote: Reason for CRM: Tyler Richards [id number] [facility] CALLED ABOUT THE PATIENTS MEDS AND WOULD LIKE Tyler Richards OR HER NURSE TO CALL HER BACK >> Feb 05, 2024 10:56 AM [name] wrote: Tyler Richards  WITH DR ETHAN YUN [facility] is calling to see if patient was still taking Aspirin  81MG  since he was taken off plavix .

## 2024-02-05 NOTE — Telephone Encounter (Signed)
 Called Tyler Richards at Dr. Thora Flint office back and left a voice message asking to give me a call back. I will try calling again.   Per Kathrene Parents, NP patient is not taking ASA 81 mg that was stopped along with the Plavix  and that she wanted Dr. Gorge Laud to know and to let them know patient needs to be seen by their office if someone could contact patient to schedule an appointment

## 2024-02-06 ENCOUNTER — Ambulatory Visit (INDEPENDENT_AMBULATORY_CARE_PROVIDER_SITE_OTHER): Admitting: Nurse Practitioner

## 2024-02-06 ENCOUNTER — Encounter: Payer: Self-pay | Admitting: Nurse Practitioner

## 2024-02-06 VITALS — BP 110/58 | HR 67 | Temp 97.1°F | Ht 72.0 in | Wt 158.0 lb

## 2024-02-06 DIAGNOSIS — G903 Multi-system degeneration of the autonomic nervous system: Secondary | ICD-10-CM

## 2024-02-06 DIAGNOSIS — D500 Iron deficiency anemia secondary to blood loss (chronic): Secondary | ICD-10-CM

## 2024-02-06 DIAGNOSIS — R600 Localized edema: Secondary | ICD-10-CM | POA: Diagnosis not present

## 2024-02-06 DIAGNOSIS — K7469 Other cirrhosis of liver: Secondary | ICD-10-CM

## 2024-02-06 LAB — CBC
HCT: 29.4 % — ABNORMAL LOW (ref 39.0–52.0)
Hemoglobin: 9.9 g/dL — ABNORMAL LOW (ref 13.0–17.0)
MCHC: 33.8 g/dL (ref 30.0–36.0)
MCV: 96.5 fl (ref 78.0–100.0)
Platelets: 300 10*3/uL (ref 150.0–400.0)
RBC: 3.05 Mil/uL — ABNORMAL LOW (ref 4.22–5.81)
RDW: 15.9 % — ABNORMAL HIGH (ref 11.5–15.5)
WBC: 3.3 10*3/uL — ABNORMAL LOW (ref 4.0–10.5)

## 2024-02-06 LAB — COMPREHENSIVE METABOLIC PANEL WITH GFR
ALT: 24 U/L (ref 0–53)
AST: 33 U/L (ref 0–37)
Albumin: 2.9 g/dL — ABNORMAL LOW (ref 3.5–5.2)
Alkaline Phosphatase: 97 U/L (ref 39–117)
BUN: 9 mg/dL (ref 6–23)
CO2: 27 meq/L (ref 19–32)
Calcium: 8.3 mg/dL — ABNORMAL LOW (ref 8.4–10.5)
Chloride: 102 meq/L (ref 96–112)
Creatinine, Ser: 0.84 mg/dL (ref 0.40–1.50)
GFR: 86.32 mL/min (ref >60.00)
Glucose, Bld: 123 mg/dL — ABNORMAL HIGH (ref 70–99)
Potassium: 4.1 meq/L (ref 3.5–5.1)
Sodium: 137 meq/L (ref 135–145)
Total Bilirubin: 0.4 mg/dL (ref 0.2–1.2)
Total Protein: 5.7 g/dL — ABNORMAL LOW (ref 6.0–8.3)

## 2024-02-06 LAB — IBC + FERRITIN
Ferritin: 81.2 ng/mL (ref 22.0–322.0)
Iron: 51 ug/dL (ref 42–165)
Saturation Ratios: 19.8 % — ABNORMAL LOW (ref 20.0–50.0)
TIBC: 257.6 ug/dL (ref 250.0–450.0)
Transferrin: 184 mg/dL — ABNORMAL LOW (ref 212.0–360.0)

## 2024-02-06 NOTE — Progress Notes (Signed)
 Established Patient Visit  Patient: Tyler Richards   DOB: 01-22-1951   73 y.o. Male  MRN: [id number] Visit Date: 02/06/2024  Subjective:     Chief Complaint  Patient presents with   Follow-up   Accompanied by wife.  HPI Iron deficiency anemia Repeat cbc and iron  Other cirrhosis of liver (HCC) Repeat CMP Persistent distended and soft ABDOMEN, but no pain and normal bowel sounds. No nausea or anorexia. No jaundice, no bruising  Neurogenic orthostatic hypotension (HCC) Stable BP with use of furosemide  x 3days. Repeat BMP today  BP Readings from Last 3 Encounters:  02/06/24 (!) 110/58  01/30/24 102/64  12/27/23 102/60    Wt Readings from Last 3 Encounters:  02/06/24 158 lb (71.7 kg)  01/30/24 153 lb 12.8 oz (69.8 kg)  12/27/23 138 lb 6.4 oz (62.8 kg)    Reviewed medical, surgical, and social history today  Medications: Outpatient Medications Prior to Visit  Medication Sig   atorvastatin  (LIPITOR) 40 MG tablet Take 1 tablet (40 mg total) by mouth daily.   dibucaine (NUPERCAINAL) 1 % ointment Apply topically as needed. Apply to affected area   gabapentin  (NEURONTIN ) 300 MG capsule Take 1 capsule (300 mg total) by mouth 3 (three) times daily.   HYDROcodone-acetaminophen  (NORCO/VICODIN) 5-325 MG tablet Take 0.5 tablets by mouth every 6 (six) hours as needed.   midodrine  (PROAMATINE ) 10 MG tablet Take 1 tablet (10 mg total) by mouth daily as needed. If BP<100/60   Multiple Vitamins-Minerals (MENS MULTIVITAMIN PO) Take by mouth.   potassium chloride  SA (KLOR-CON  M) 20 MEQ tablet TAKE 1 TABLET BY MOUTH EACH DAY   trimethoprim -polymyxin b (POLYTRIM) ophthalmic solution Place 1 drop into the left eye as needed.   [DISCONTINUED] furosemide  (LASIX ) 20 MG tablet Take 1 tablet (20 mg total) by mouth every other day.   dicyclomine (BENTYL) 20 MG tablet Take by mouth.   FEROSUL 325 (65 Fe) MG tablet TAKE 1 TABLET BY MOUTH EVERY MORNING AFTER BREAKFAST   fludrocortisone   (FLORINEF ) 0.1 MG tablet Take 1 tablet (0.1 mg total) by mouth daily as needed. If BP <100/60   pantoprazole  (PROTONIX ) 40 MG tablet Take 1 tablet (40 mg total) by mouth 2 (two) times daily before a meal. (Patient not taking: Reported on 02/06/2024)   [DISCONTINUED] acetaminophen  (TYLENOL ) 325 MG tablet Take 650 mg by mouth every 6 (six) hours as needed for mild pain or headache. (Patient not taking: Reported on 02/06/2024)   [DISCONTINUED] allopurinol  (ZYLOPRIM ) 100 MG tablet Take 1 tablet (100 mg total) by mouth daily. (Patient not taking: Reported on 02/06/2024)   No facility-administered medications prior to visit.   Reviewed past medical and social history.   ROS per HPI above      Objective:  BP (!) 110/58 (BP Location: Right Arm, Patient Position: Sitting, Cuff Size: Normal)   Pulse 67   Temp (!) 97.1 F (36.2 C) (Temporal)   Ht 6' (1.829 m)   Wt 158 lb (71.7 kg)   BMI 21.43 kg/m      Physical Exam Vitals and nursing note reviewed.  Cardiovascular:     Rate and Rhythm: Normal rate and regular rhythm.     Pulses: Normal pulses.     Heart sounds: Normal heart sounds.  Pulmonary:     Effort: Pulmonary effort is normal.     Breath sounds: Normal breath sounds.  Abdominal:     General:  There is distension.     Palpations: Abdomen is soft.     Tenderness: There is no abdominal tenderness.  Musculoskeletal:     Right lower leg: Edema present.     Left lower leg: Edema present.  Skin:    Findings: No erythema.  Neurological:     Mental Status: He is alert and oriented to person, place, and time.     No results found for any visits on 02/06/24.    Assessment & Plan:    Problem List Items Addressed This Visit     Iron deficiency anemia   Repeat cbc and iron      Relevant Orders   CBC   IBC + Ferritin   Neurogenic orthostatic hypotension (HCC)   Stable BP with use of furosemide  x 3days. Repeat BMP today      Other cirrhosis of liver (HCC)   Repeat  CMP Persistent distended and soft ABDOMEN, but no pain and normal bowel sounds. No nausea or anorexia. No jaundice, no bruising      Other Visit Diagnoses       Localized edema    -  Primary   Relevant Orders   Comprehensive metabolic panel with GFR      Return in about 4 weeks (around 03/05/2024) for HTN and edema.     Kathrene Parents, NP

## 2024-02-06 NOTE — Assessment & Plan Note (Signed)
Repeat cbc and iron

## 2024-02-06 NOTE — Assessment & Plan Note (Signed)
 Repeat CMP Persistent distended and soft ABDOMEN, but no pain and normal bowel sounds. No nausea or anorexia. No jaundice, no bruising

## 2024-02-06 NOTE — Assessment & Plan Note (Signed)
 Stable BP with use of furosemide  x 3days. Repeat BMP today

## 2024-02-06 NOTE — Patient Instructions (Signed)
Go to lab Maintain current med doses 

## 2024-02-08 ENCOUNTER — Ambulatory Visit: Payer: Self-pay | Admitting: Nurse Practitioner

## 2024-02-14 ENCOUNTER — Other Ambulatory Visit: Payer: Self-pay | Admitting: Nurse Practitioner

## 2024-02-14 DIAGNOSIS — R600 Localized edema: Secondary | ICD-10-CM

## 2024-03-18 ENCOUNTER — Other Ambulatory Visit: Payer: Self-pay | Admitting: Nurse Practitioner

## 2024-03-18 DIAGNOSIS — D500 Iron deficiency anemia secondary to blood loss (chronic): Secondary | ICD-10-CM

## 2024-03-18 DIAGNOSIS — K625 Hemorrhage of anus and rectum: Secondary | ICD-10-CM

## 2024-03-23 ENCOUNTER — Encounter (HOSPITAL_COMMUNITY): Payer: Self-pay | Admitting: Interventional Radiology

## 2024-03-27 ENCOUNTER — Other Ambulatory Visit: Payer: Self-pay | Admitting: Nurse Practitioner

## 2024-04-08 ENCOUNTER — Ambulatory Visit

## 2024-04-11 ENCOUNTER — Ambulatory Visit (INDEPENDENT_AMBULATORY_CARE_PROVIDER_SITE_OTHER): Admitting: Nurse Practitioner

## 2024-04-11 ENCOUNTER — Encounter: Payer: Self-pay | Admitting: Nurse Practitioner

## 2024-04-11 VITALS — BP 108/66 | HR 63 | Temp 98.0°F | Wt 155.6 lb

## 2024-04-11 DIAGNOSIS — D500 Iron deficiency anemia secondary to blood loss (chronic): Secondary | ICD-10-CM

## 2024-04-11 DIAGNOSIS — K625 Hemorrhage of anus and rectum: Secondary | ICD-10-CM

## 2024-04-11 DIAGNOSIS — K7469 Other cirrhosis of liver: Secondary | ICD-10-CM

## 2024-04-11 DIAGNOSIS — M109 Gout, unspecified: Secondary | ICD-10-CM | POA: Diagnosis not present

## 2024-04-11 DIAGNOSIS — I10 Essential (primary) hypertension: Secondary | ICD-10-CM

## 2024-04-11 MED ORDER — ALLOPURINOL 100 MG PO TABS: 100.0000 mg | ORAL_TABLET | Freq: Every day | ORAL | 3 refills | Status: DC

## 2024-04-11 MED ORDER — PANTOPRAZOLE SODIUM 40 MG PO TBEC: 40.0000 mg | DELAYED_RELEASE_TABLET | Freq: Two times a day (BID) | ORAL | 1 refills | Status: DC

## 2024-04-11 NOTE — Assessment & Plan Note (Signed)
 No acute gout exacerbation Maintain allopurinol  dose dose

## 2024-04-11 NOTE — Patient Instructions (Signed)
Maintain current medications  

## 2024-04-11 NOTE — Assessment & Plan Note (Signed)
 Persistent distended, but soft and nontender ABDOMEN. Normal bowel sounds. No nausea or anorexia. Wife reports normal BM No jaundice, no bruising. He has pending appointment for repeat MRI abd. ABDOMEN US  02/2024: Changes consistent with cirrhosis. 3.3 cm hypoechoic focus left lobe liver corresponding to 2.7 cm hypodense focus on CT. Would recommend MRI abdomen to further evaluate. Stable ascites.

## 2024-04-11 NOTE — Progress Notes (Signed)
 Established Patient Visit  Patient: Tyler Richards   DOB: August 04, 1951   73 y.o. 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 Normal bowel sounds. No nausea or anorexia. Wife reports normal BM No jaundice, no bruising. He has pending appointment for repeat MRI abd. ABDOMEN US  02/2024: Changes consistent with cirrhosis. 3.3 cm hypoechoic focus left lobe liver corresponding to 2.7 cm hypodense focus on CT. Would recommend MRI abdomen to further evaluate. Stable ascites.   Wt Readings from Last 3 Encounters:  04/11/24 155 lb 9.6 oz (70.6 kg)  02/06/24 158 lb (71.7 kg)  01/30/24 153 lb 12.8 oz (69.8 kg)    Reviewed medical, surgical, and social history today  Medications: Outpatient Medications Prior to Visit  Medication Sig   aspirin  EC 81 MG tablet Take 81 mg by mouth daily.   atorvastatin  (LIPITOR) 40 MG tablet Take 1 tablet (40 mg total) by mouth daily.   dibucaine (NUPERCAINAL) 1 % ointment Apply topically as needed. Apply to affected area   dicyclomine (BENTYL) 20 MG tablet Take by mouth.   FEROSUL 325 (65 Fe) MG tablet TAKE 1 TABLET BY MOUTH EVERY MORNING AFTER BREAKFAST   fludrocortisone  (FLORINEF ) 0.1 MG tablet Take 1 tablet (0.1 mg total) by mouth daily as needed. If BP <100/60   gabapentin  (NEURONTIN ) 300 MG capsule Take 1 capsule (300 mg total) by mouth 3 (three) times daily.   HYDROcodone-acetaminophen  (NORCO/VICODIN) 5-325 MG tablet Take 0.5 tablets by mouth every 6 (six) hours as needed.   midodrine   (PROAMATINE ) 10 MG tablet Take 1 tablet (10 mg total) by mouth daily as needed. If BP<100/60   Multiple Vitamins-Minerals (MENS MULTIVITAMIN PO) Take by mouth.   potassium chloride  SA (KLOR-CON  M) 20 MEQ tablet TAKE 1 TABLET BY MOUTH EACH DAY   trimethoprim -polymyxin b (POLYTRIM) ophthalmic solution Place 1 drop into the left eye as needed.   [DISCONTINUED] pantoprazole  (PROTONIX ) 40 MG tablet Take 1 tablet (40 mg total) by mouth 2 (two) times daily before a meal.   No facility-administered medications prior to visit.   Reviewed past medical and social history.   ROS per HPI above  Last CBC Lab Results  Component Value Date   WBC 3.3 (L) 02/06/2024   HGB 9.9 (L) 02/06/2024   HCT 29.4 (L) 02/06/2024   MCV 96.5 02/06/2024   MCH 26.5 12/22/2020   RDW 15.9 (H) 02/06/2024   PLT 300.0 02/06/2024   Last metabolic panel Lab Results  Component Value Date   GLUCOSE 123 (H) 02/06/2024   NA 137 02/06/2024   K 4.1 02/06/2024   CL 102 02/06/2024   CO2 27 02/06/2024   BUN 9 02/06/2024   CREATININE 0.84 02/06/2024   GFR 86.32 02/06/2024   CALCIUM  8.3 (L) 02/06/2024   PHOS 2.9 06/17/2022   PROT 5.7 (L) 02/06/2024   ALBUMIN 2.9 (L) 02/06/2024   BILITOT 0.4 02/06/2024   ALKPHOS 97 02/06/2024   AST 33 02/06/2024   ALT  24 02/06/2024   ANIONGAP 10 12/18/2020      Objective:  BP 108/66 (BP Location: Left Arm, Patient Position: Sitting, Cuff Size: Normal)   Pulse 63   Temp 98 F (36.7 C) (Oral)   Wt 155 lb 9.6 oz (70.6 kg)   SpO2 98%   BMI 21.10 kg/m      Physical Exam Vitals and nursing note reviewed.  Cardiovascular:     Rate and Rhythm: Normal rate and regular rhythm.     Pulses: Normal pulses.     Heart sounds: Normal heart sounds.  Pulmonary:     Effort: Pulmonary effort is normal.     Breath sounds: Normal breath sounds.  Abdominal:     General: Bowel sounds are normal. There is distension.     Palpations: Abdomen is soft.     Tenderness: There is no abdominal  tenderness. There is no guarding or rebound.  Musculoskeletal:     Right lower leg: No edema.     Left lower leg: No edema.  Neurological:     Mental Status: He is alert and oriented to person, place, and time.  Psychiatric:        Mood and Affect: Mood normal.        Behavior: Behavior normal.        Thought Content: Thought content normal.     No results found for any visits on 04/11/24.    Assessment & Plan:    Problem List Items Addressed This Visit     Gastrointestinal hemorrhage   Relevant Medications   pantoprazole  (PROTONIX ) 40 MG tablet   Hypertension   Stable without medication Wife denies need for midodrine  and florinef  at this time BP Readings from Last 3 Encounters:  04/11/24 108/66  02/06/24 (!) 110/58  01/30/24 102/64        Relevant Medications   aspirin  EC 81 MG tablet   Iron deficiency anemia   Relevant Medications   pantoprazole  (PROTONIX ) 40 MG tablet   Other cirrhosis of liver (HCC)   Persistent distended, but soft and nontender ABDOMEN. Normal bowel sounds. No nausea or anorexia. Wife reports normal BM No jaundice, no bruising. He has pending appointment for repeat MRI abd. ABDOMEN US  02/2024: Changes consistent with cirrhosis. 3.3 cm hypoechoic focus left lobe liver corresponding to 2.7 cm hypodense focus on CT. Would recommend MRI abdomen to further evaluate. Stable ascites.       Polyarticular gout - Primary   No acute gout exacerbation Maintain allopurinol  dose dose      Relevant Medications   aspirin  EC 81 MG tablet   allopurinol  (ZYLOPRIM ) 100 MG tablet   Return in about 3 months (around 07/12/2024) for HTN, Gout, hyperlipidemia (fasting).     Roselie Mood, NP

## 2024-04-11 NOTE — Assessment & Plan Note (Signed)
 Stable without medication Wife denies need for midodrine  and florinef  at this time BP Readings from Last 3 Encounters:  04/11/24 108/66  02/06/24 (!) 110/58  01/30/24 102/64

## 2024-04-19 ENCOUNTER — Other Ambulatory Visit: Payer: Self-pay | Admitting: Nurse Practitioner

## 2024-04-19 DIAGNOSIS — E876 Hypokalemia: Secondary | ICD-10-CM

## 2024-05-03 ENCOUNTER — Ambulatory Visit

## 2024-06-27 ENCOUNTER — Ambulatory Visit: Admitting: Nurse Practitioner

## 2024-07-10 ENCOUNTER — Ambulatory Visit: Admitting: Nurse Practitioner

## 2024-07-10 ENCOUNTER — Telehealth: Payer: Self-pay

## 2024-07-10 NOTE — Telephone Encounter (Signed)
 Called patient's wife Blaiden Werth who is on DPR on file and informed her of Charlotte's comments. She stated that she is not sure how she can get him into the office because she can not lift him and his leg is swollen. She asked what can she do because she can not left him and she wants him to be see?

## 2024-07-10 NOTE — Telephone Encounter (Signed)
 Copied from CRM #[id number]. Topic: Clinical - Medical Advice >> Jul 10, 2024  8:25 AM Thersia BROCKS wrote: Reason for CRM: Patient wife [name], called in stated he missed appointment this morning, becuase patient wasnt able to move his left leg, wanted to know if NP Roselie could order some in home care for him regarding .  [phone]

## 2024-07-10 NOTE — Telephone Encounter (Signed)
 Patient's wife Tyler Richards returned my call. I informed her of Tyler Richards's comments/recommendations of calling EMS to have [name] transported to the ED for evaluation of his leg. She thanked me for calling and verbalized understanding. All (if any) questions were answered.

## 2024-07-10 NOTE — Telephone Encounter (Signed)
 Called and left a voice message per DPR on file for patient's wife Tyler Richards asking to give me a call back at the office at [phone]

## 2024-08-27 NOTE — Discharge Summary (Signed)
 [facility] Discharge Summary  PCP: Roselie Mood, NP  Discharge Details   Admit date:         08/14/2024 Discharge date:                    08/27/2024  Hospital LOS:    13 days  Discharge Service: [facility] Hospitalists  Discharge Attending Physician: Dr Viviane  Discharge to: home with therapy  Condition at Discharge: fair  Code Status: No CPR  Discharge Diagnoses   Active Hospital Problems   Diagnosis Date Noted POA  . *Septic shock (*) 08/18/2024 No  . Moderate malnutrition (*) 08/16/2024 Yes  . Acute UTI (urinary tract infection) 08/16/2024 Clinically Undetermined  . 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 Esophageal thickening 08/14/2024 Yes  . Normocytic anemia 08/14/2024 Yes  . Ascites 08/14/2024 Yes  . Refractory ascites 08/14/2024 Yes  . GERD (gastroesophageal reflux disease) 05/06/2021 Yes  . Hyponatremia 05/06/2021 Yes    Resolved Hospital Problems  No resolved problems to display.     Discharge Medications     Current Discharge Medication List     START taking these medications      Details  midodrine  HCl 5 mg tablet Commonly known as: PROAMATINE   Take one tablet (5 mg dose) by mouth every 8 (eight) hours for 30 days. Quantity: 90 tablet   naloxone nasal spray (TAKE HOME PACK) Commonly known as: NARCAN  one spray by Intranasal route as needed for Opioid Reversal for up to 2 doses. Quantity: 2 each   oxyCODONE HCl 5 mg immediate release tablet Commonly known as: ROXICODONE  Take one half tablet to one tablet (2.5-5 mg dose) by mouth every 6 (six) hours as needed for Pain for up to 14 days. Max Daily Amount: 20 mg Quantity: 15 tablet   spironolactone 12.5 mg tablet Commonly known as: ALDACTONE  Take one tablet (12.5 mg dose) by mouth daily. Quantity: 30 tablet       CONTINUE these medications which have CHANGED      Details  allopurinol   100 mg tablet Commonly known as: ZYLOPRIM  What changed: when to take this  Take one tablet (100 mg dose) by mouth daily. Quantity: 30 tablet   aspirin  EC tablet Commonly known as: ASPIRIN  LOW DOSE What changed: when to take this  Take one tablet (81 mg dose) by mouth daily. Quantity: 90 tablet       CONTINUE these medications which have NOT CHANGED      Details  atorvastatin  40 mg tablet Commonly known as: LIPITOR  Take one tablet (40 mg dose) by mouth every morning.   FEROSUL 325 (65 FE) MG tablet Generic drug: ferrous sulfate   TAKE 1 TABLET BY MOUTH EVERY MORNING AFTER BREAKFAST   gabapentin  300 mg capsule Commonly known as: NEURONTIN   Take one capsule (300 mg dose) by mouth 3 (three) times a day.   LUTEIN PO  Take by mouth as needed.   multivitamin tablet  Take one tablet by mouth every morning.   pantoprazole  sodium 40 mg tablet Commonly known as: PROTONIX   Take one tablet (40 mg dose) by mouth 2 (two) times daily before meals.   potassium chloride  20 mEq CR tablet Commonly known as: K-DUR,KLOR-CON   Take one tablet (20 mEq dose) by mouth every morning.      * You might also be taking other medications not listed above. If you have questions  about any of your other medications, talk to the person who prescribed them or your Primary Care Provider.          STOP taking these medications    acetaminophen  325 mg tablet Commonly known as: TYLENOL         Procedures   Bedside Procedures   No orders found      Hospital Course  Physicians involved in care during this hospitalization Attending Provider: Ludie Primmer, DO Attending Provider: Rupert DELENA Cera, MD Attending Provider: Gerilyn DELENA Sales, MD Attending Provider: Libby CHRISTELLA Gilbert, MD Attending Provider: Molinda KANDICE Packer, MD Attending Provider: Nancyann Ozell Duet, MD Attending Provider: Aline KANDICE Lek, MD Attending Provider: Rupert DELENA Cera, MD Admitting Provider: Rupert DELENA Cera, MD Consulting  Physician: Reyes Ray Rumpf, FNP Consulting Physician: Athena Consult To [facility] Consulting Physician: Athena Consult To [facility] Consulting Physician: Alm KATHEE Hoyer, MD Consulting Physician: Gerilyn DELENA Sales, MD Consulting Physician: Aline KANDICE Lek, MD  Hospital Course:       History of likely liver cancer, cirrhosis, stroke with hemiplegia, here with ascites and 3+ peripheral edema; had 9.5L taken out and BP soft.  Was given 75G albumin.  Wrapping/elevating legs.   Hypotension persisted and was transferred to ICU on levophed , treated for possible UTI and septic shock. Started on midodrine  Palliative following   Patient is now more hemodynamically stable on midodrine  with some fluctuations  PT recommended skilled nursing facility placement  The patient's wife is however adamant that he will not go to a facility Worked with PT.  We gave him a couple more days to regain some of his strength.  Wife now feels comfortable taking him home.  Unfortunately patient is frail and cannot tolerate more significant diuresis at this time.  Patient has been referred to gastroenterology.  He did have a foley catheter for urinary retention but still failed after voiding trial so foley catheter was again replaced.  Patient referred to urology.  Other issues addressed during this hospitalization:  Assessment & Plan Ascites Elevated CEA, liver lesion- stage pT3pN1b - very high risk for chemotherapy, no plans for chemotherapy in the future S/p paracentesis, 9.5L removed 08/15/2024.  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 (*)  Aware  Turn q2h Esophageal thickening  Barrett esophagus  Protonix  as above Normocytic anemia  Monitor Refractory ascites  ACE wraps lower extremities   Elevated extremities  Continue spironolactone 12.5 mg daily Moderate malnutrition (*)  Continue ensure plus high protein Acute UTI (urinary tract infection) Treated with rocephin  Septic shock (*) Resolved  Hypomagnesemia Hyponatremia Fluid overload Anemia Moderate protein calorie malnutrition Limitation of activities due to disability  Other Instructions     Ambulatory Referral to Home Health      Home Health Face-to-Face:   This is to certify that this patient is under the care of a physician and that the physician, or a nurse practitioner or physician assistant working with said physician, had a face-to-face encounter that meets the physician face-to-face encounter requirements with this patient on date listed in order questions section.   If this patient is a Medicare patient, I certify that my clinical findings support the patient is homebound (i.e. absences from home require considerable and taxing effort and are for medical reasons or religious services or infrequently or of short duration when for other reasons). If this  patient is a Medicaid patient, I certify that the home is the most appropriate setting for services.  This also certifies that home health intermittent skilled nursing care, physical therapy and/or speech language pathology services are medically necessary to provide the care and treatments as identified.                 Home Health Disciplines:  Skilled Nursing Physical Therapy Occupational Therapy HHA     Home health services are needed due to: difficulty with ambulation/transfers due to condition   This certifies this patient requires services in the home due to: poor circulation, numbness, weakness, paralysis or pain in LEs, causing balance or gait disorders   Referral Type: Home Health Care   Evaluate and Return   Ambulatory referral to Gastroenterology     Reason for referral: Cirrhosis   Referral Type: Consultation   Evaluate and  Return   Ambulatory referral to Podiatry     Overgrown toenails   What is the reason for visit?: Podiatry   Referral Type: Consultation   Evaluate and Return   Ambulatory referral to Urology     Reason for referral: Urinary retention   Referral Type: Consultation   Evaluate and Return   Discharge instructions     Discharge instructions     Work on discontinuing all tobacco or e-cigarette products.  If you would like help in quitting, please let your doctor know. If you are prescribed medications that can cause drowsiness or affect your thinking, like narcotics, benzodiazepines, sleeping aids, etc refrain from driving while under the influence of these medications. If you are prescribed narcotics and believe that you or someone in your place of residence is at risk for narcotic overdose then you should pick up a narcan syringe from the pharmacy in case of accidental overdose.   Follow-up with Primary Care Physician     Reason for referral: sepsis, Hypotension   Patient aware of referral?: Yes   Referral Type: Consultation   Evaluate and Return   Notify Physician for dizziness when trying to stand     Notify Physician for increased or unrelieved pain     Notify Physician for trouble breathing or symptoms that are worse     Notify physician - Temp     Call MD if Temperature above 100  Degrees F   Follow-up with Primary Care Physician     Reason for referral: Hospitalization   Referral Type: Consultation   Evaluate and Return       Exam at Discharge  BP (!) 93/54 (BP Location: Right Upper Arm, Patient Position: Lying)   Pulse 60   Temp 98.1 F (36.7 C) (Oral)   Resp 18   Ht 1.829 m (6')   Wt 70.8 kg (156 lb)   SpO2 99%   BMI 21.16 kg/m   Physical Exam Constitutional:      General: He is not in acute distress.    Appearance: He is not diaphoretic.  HENT:     Head: Normocephalic and atraumatic.  Cardiovascular:     Rate and Rhythm: Normal rate and regular rhythm.      Heart sounds: No murmur heard.    No gallop.  Pulmonary:     Effort: No respiratory distress.     Breath sounds: Normal breath sounds. No wheezing or rales.  Abdominal:     General: Abdomen is flat. Bowel sounds are normal. There is distension.     Palpations: Abdomen is soft.  Tenderness: There is no abdominal tenderness. There is no right CVA tenderness, left CVA tenderness, guarding or rebound.  Musculoskeletal:        General: No deformity.     Right lower leg: Edema (trace) present.     Left lower leg: Edema (1+) present.  Skin:    General: Skin is warm and dry.     Coloration: Skin is not jaundiced.  Neurological:     General: No focal deficit present.     Mental Status: He is alert.     Motor: Weakness (left sided with arm contraction) present.  Psychiatric:        Mood and Affect: Mood normal.        Behavior: Behavior normal.     Post Hospital Care   Discharge Procedure Orders  Follow-up with Primary Care Physician  Standing Status: Future  Referral Priority: Routine Referral Type: Consultation  Referral Reason: Evaluate and Return  Number of Visits Requested: 1 Expiration Date: 02/20/25   Ambulatory referral to Podiatry  Standing Status: Future  Referral Priority: Routine Referral Type: Consultation  Referral Reason: Evaluate and Return  Requested Specialty: Podiatric Surgery  Number of Visits Requested: 1 Expiration Date: 02/21/25   Ambulatory Referral to Home Health  Referral Priority: Routine Referral Type: Home Health Care  Referral Reason: Evaluate and Return  Requested Specialty: Home Health Services  Number of Visits Requested: 1 Expiration Date: 02/22/25   Ambulatory referral to Gastroenterology  Standing Status: Future  Referral Priority: Routine Referral Type: Consultation  Referral Reason: Evaluate and Return  Referred to Provider: SHARMAN ALM NOVAK Requested Specialty: Gastroenterology  Number of Visits Requested: 1 Expiration Date: 02/22/25    Ambulatory referral to Urology  Standing Status: Future  Referral Priority: Routine Referral Type: Consultation  Referral Reason: Evaluate and Return  Referred to Provider: FANNY SELMA ORN Requested Specialty: Urology  Number of Visits Requested: 1 Expiration Date: 02/22/25   Follow-up with Primary Care Physician  Standing Status: Future  Referral Priority: Routine Referral Type: Consultation  Referral Reason: Evaluate and Return  Number of Visits Requested: 1 Expiration Date: 02/22/25   Cardiac diet (heart healthy)   Diet Mechanical Soft/Easy to Chew 7  Order Comments: Ensure Plus High Protein with meals   Notify physician - Temp  Order Comments: Call MD if Temperature above 100  Degrees F   Notify Physician for trouble breathing or symptoms that are worse   Notify Physician for increased or unrelieved pain   Notify Physician for dizziness when trying to stand   Activity as tolerated   Discharge instructions   Activity as tolerated  Order Comments: Discuss with physician at follow up visit an appropriate exercise program   Discharge instructions  Order Comments: Work on discontinuing all tobacco or e-cigarette products.  If you would like help in quitting, please let your doctor know. If you are prescribed medications that can cause drowsiness or affect your thinking, like narcotics, benzodiazepines, sleeping aids, etc refrain from driving while under the influence of these medications. If you are prescribed narcotics and believe that you or someone in your place of residence is at risk for narcotic overdose then you should pick up a narcan syringe from the pharmacy in case of accidental overdose.   Appointments which have been scheduled    Sep 03, 2024 9:00 AM Appointment with Roselie Bishop Mood, NP [address] [place], [place] [address]    Sep 12, 2024 11:30 AM FOLLOW UP with Evalene LABOR  Gunnar, DPM [facility] (--) [address] [place] [address] [phone]   Sep 25, 2024 8:30 AM Follow Up Appointment with Charmaine MARLA Fern, PA-C [facility] [phone] [place] [address] [phone]   May 05, 2025 1:30 PM Follow Up Appointment with Dwight Paul, MD [facility]) (--) [address] [place] [address] [phone]        Time spent in discharge process:  35 minutes   Electronically signed: Rupert DELENA Cera, MD 08/27/2024 / 4:38 PM   *Some images could not be shown.

## 2024-08-28 ENCOUNTER — Telehealth: Payer: Self-pay

## 2024-08-28 NOTE — Transitions of Care (Post Inpatient/ED Visit) (Signed)
   08/28/2024  Name: Tyler Richards MRN: [id number] DOB: 01-10-1951  Today's TOC FU Call Status: Today's TOC FU Call Status:: Unsuccessful Call (1st Attempt) Unsuccessful Call (1st Attempt) Date: 08/28/24  Attempted to reach the patient regarding the most recent Inpatient/ED visit.  Spoke with wife [name] who states this is not a good time to talk she's in the middle of giving him a bath and getting ready for home health nurse to come.  Follow Up Plan: Additional outreach attempts will be made to reach the patient to complete the Transitions of Care (Post Inpatient/ED visit) call.   Richerd Fish, RN, BSN, CCM [facility], [facility] Management Coordinator Direct Dial: [phone]

## 2024-08-28 NOTE — Telephone Encounter (Signed)
 Copied from CRM #[id number]. Topic: Clinical - Home Health Verbal Orders >> Aug 28, 2024 11:27 AM Kevelyn M wrote: Caller/Agency: [facility] Callback Number: [phone] Service Requested: Pallative care Any new concerns about the patient? No

## 2024-08-29 ENCOUNTER — Telehealth: Payer: Self-pay

## 2024-08-29 NOTE — Telephone Encounter (Signed)
 Called patient's wife Riel Hirschman who is on on DPR on file on file and ask if she and her husband are requesting palliative care or hospice care. Mrs. Atiyeh said that she was told it was got palliative when Mr. Klem was discharged from the hospital. I thanked her for taking my call.

## 2024-08-29 NOTE — Telephone Encounter (Signed)
 Called Randine at [facility] and informed her that Roselie Mood, NP is giving verbal order for palliative care. She thanked me for the call and no other questions were asked or information needed at this time.

## 2024-08-29 NOTE — Telephone Encounter (Signed)
 Copied from CRM #[id number]. Topic: Clinical - Home Health Verbal Orders >> Aug 29, 2024  9:37 AM Adelita BRAVO wrote: Caller/Agency: Nena, nurse with [facility] Number: [phone] Service Requested: Skilled Nursing Frequency: Weekly dressing changes to sacrum. Any new concerns about the patient? Yes, Nena questioning if they should change patient's foley catheter once a month as well.

## 2024-08-29 NOTE — Transitions of Care (Post Inpatient/ED Visit) (Signed)
   08/29/2024  Name: SOHAIL CAPRARO MRN: [id number] DOB: March 19, 1951  Today's TOC FU Call Status: Today's TOC FU Call Status:: Unsuccessful Call (2nd Attempt) Unsuccessful Call (2nd Attempt) Date: 08/29/24  Attempted to reach the patient regarding the most recent Inpatient/ED visit.  Unidentified male answered and states he is not here right now and hung up.   Follow Up Plan: Additional outreach attempts will be made to reach the patient to complete the Transitions of Care (Post Inpatient/ED visit) call.   Richerd Fish, RN, BSN, CCM [facility], [facility] Management Coordinator Direct Dial: [phone]

## 2024-08-30 ENCOUNTER — Telehealth: Payer: Self-pay

## 2024-08-30 ENCOUNTER — Other Ambulatory Visit

## 2024-08-30 DIAGNOSIS — K7469 Other cirrhosis of liver: Secondary | ICD-10-CM

## 2024-08-30 DIAGNOSIS — Z993 Dependence on wheelchair: Secondary | ICD-10-CM

## 2024-08-30 DIAGNOSIS — I6930 Unspecified sequelae of cerebral infarction: Secondary | ICD-10-CM

## 2024-08-30 NOTE — Patient Outreach (Signed)
 Social Drivers of Health  Community Resource and Care Coordination Visit Note   08/30/2024  Name: Tyler Richards MRN: [id number] DOB:08-Feb-1951  Situation: Referral received for [facility] needs assessment and assistance related to Transportation. I obtained verbal consent from Caregiver.  Visit completed with wife Tyler Richards on the phone.   Background:   SDOH Interventions Today    Flowsheet Row Most Recent Value  SDOH Interventions   Transportation Interventions Other (Comment)  [Patient can't transfer into a wheelchair and wife is not comfortable with hoyier lift. Wife will request additional training and SW will connect to [facility] for transportation.]  Financial Strain Interventions Intervention Not Indicated     Assessment:   Goals Addressed             This Visit's Progress    BSW Goals       Current SDOH Barriers:  Transportation  Interventions: Patient interviewed and appropriate screenings performed Referred patient to community resources  Provided patient with information about [facility] transportation services, use of hoyer lift Discussed plans with patient for ongoing follow up and provided patient with direct contact number Advised patient to speak to PT, RN regarding training for hoyer lift. Provided education on Advanced Directives Patients wife has been shown how to use hoyer lift, but doesn't feel comfortable.  SW explained that using the hoyer lift will allow patient to be able to use transportation services to get to his medical appointments, shower, etc. Patient has also been offered Palliative Care and will contact the service on Monday to decide if patient will enroll.          Recommendation:   Work to improve comfort with nurse, adult.  Follow Up Plan:   Telephone follow up appointment date/time:  09/04/24 at 10am  Tillman Gardener, BSW [place]  [facility], [facility] Social Worker Direct Dial: [phone]  Fax:  [phone] Website: [email or web address]

## 2024-08-30 NOTE — Patient Instructions (Signed)
 Visit Information  Thank you for taking time to visit with me today. Please don't hesitate to contact me if I can be of assistance to you before our next scheduled telephone appointment.  Our next appointment is by telephone on 09/04/24 at 1500  Following is a copy of your care plan:   Goals Addressed             This Visit's Progress    VBCI Transitions of Care (TOC) Care Plan       Problems:  Recent Hospitalization for treatment of UTI/Sepsis Knowledge Deficit Related to post hospital care needs  and SDOH barrier: Transportation  Goal:  Over the next 30 days, the patient will not experience hospital readmission  Interventions:  Transitions of Care: Doctor Visits  - discussed the importance of doctor visits  Patient Self Care Activities:  Attend all scheduled provider appointments Call pharmacy for medication refills 3-7 days in advance of running out of medications Call provider office for new concerns or questions  Notify RN Care Manager of TOC call rescheduling needs Participate in Transition of Care Program/Attend TOC scheduled calls Perform all self care activities independently  Take medications as prescribed   Work with the social worker to address care coordination needs and will continue to work with the clinical team to address health care and disease management related needs  Plan:  The patient has been provided with contact information for the care management team and has been advised to call with any health related questions or concerns.  Referral to [facility] for request of transportation review and assistance for medical appointments. Discussed and offered 30 day TOC program.  Patient/wife [name] agrees to 30 day telephonic calls and can decline at any time.  The patient has been provided with contact information for the care management team and has been advised to call with any health -related questions or concerns.  The patient verbalized understanding with  current plan of care.  The patient is directed to their insurance card regarding availability of benefits coverage.  Potential for Medicaid application mentioned.         The patient verbalized understanding of instructions, educational materials, and care plan provided today and DECLINED offer to receive copy of patient instructions, educational materials, and care plan.   The patient has been provided with contact information for the care management team and has been advised to call with any health related questions or concerns.  The care management team will reach out to the patient again over the next 1- 7  days.  The patient will call [facility] patient lives in [place] for Palliative Care needs per DC summary from [facility]* as advised to contact for palliative needs. Wife states PCP also following up on palliative needs.   Please call the care guide team at [phone] if you need to cancel or reschedule your appointment.   Please call the [place]  National Suicide Prevention Lifeline: 5197488675 or TTY: (857) 584-8558 TTY [phone]) to talk to a trained counselor if you are experiencing a Mental Health or Behavioral Health Crisis or need someone to talk to.  Richerd Fish, RN, BSN, CCM [facility], [facility] Management Coordinator Direct Dial: [phone]

## 2024-08-30 NOTE — Telephone Encounter (Signed)
 Returned call to [employer] nurse from [place] and informed her of Tyler Richards's comments about the foley catheter orders should be provided by urology to not attempt to remove catheter due to hx of urinary retention, unless they have orders from urology. Informed that patient has orders to be under Palliative care as well with [facility]. She thanked me for returning her call and giving her the needed information.

## 2024-08-30 NOTE — Patient Instructions (Signed)
 Visit Information  Thank you for taking time to visit with me today. Please don't hesitate to contact me if I can be of assistance to you before our next scheduled appointment.  Our next appointment is by telephone on 09/04/24 at 10am Please call the care guide team at [phone] if you need to cancel or reschedule your appointment.   Following is a copy of your care plan:   Goals Addressed             This Visit's Progress    BSW Goals       Current SDOH Barriers:  Transportation  Interventions: Patient interviewed and appropriate screenings performed Referred patient to community resources  Provided patient with information about [facility] transportation services, use of hoyer lift Discussed plans with patient for ongoing follow up and provided patient with direct contact number Advised patient to speak to PT, RN regarding training for hoyer lift. Provided education on Advanced Directives Patients wife has been shown how to use hoyer lift, but doesn't feel comfortable.  SW explained that using the hoyer lift will allow patient to be able to use transportation services to get to his medical appointments, shower, etc. Patient has also been offered Palliative Care and will contact the service on Monday to decide if patient will enroll.          Please call 911 if you are experiencing a Mental Health or Behavioral Health Crisis or need someone to talk to.  Wife Onofre Gains verbalized understanding of Care plan and visit instructions communicated this visit  Tillman Gardener, BSW [place]  [facility], [facility] Social Worker Direct Dial: [phone]  Fax: [phone] Website: [email or web address]

## 2024-08-30 NOTE — Transitions of Care (Post Inpatient/ED Visit) (Signed)
 08/30/2024  Name: Tyler Richards MRN: [id number] DOB: [birth year]  Today's TOC FU Call Status: Today's TOC FU Call Status:: Successful TOC FU Call Completed TOC FU Call Complete Date: 08/30/24  Patient's Name and Date of Birth confirmed. Name, DOB  Transition Care Management Follow-up Telephone Call Discharge Facility: Other (Non[facility] Facility) Name of Other ([facility]) Discharge Facility: [facility] Type of Discharge: Inpatient Admission Primary Inpatient Discharge Diagnosis:: Sepsis Any questions or concerns?: Yes Patient Questions/Concerns:: Transportation - unable to get to appointments  Items Reviewed: Did you receive and understand the discharge instructions provided?: Yes Medications obtained,verified, and reconciled?: Yes (Medications Reviewed) Any new allergies since your discharge?: No Do you have support at home?: Yes People in Home [RPT]: spouse Name of Support/Comfort Primary Source: [name] - Spouse wife is spokes person patient with stroke history, total care  Medications Reviewed Today: Medications Reviewed Today     Reviewed by Eilleen Richerd GRADE, RN (Registered Nurse) on 08/30/24 at 1323  Med List Status: <None>   Medication Order Taking? Sig Documenting Provider Last Dose Status Informant  allopurinol  (ZYLOPRIM ) 100 MG tablet [id number] Yes Take 1 tablet (100 mg total) by mouth daily. Katheen Roselie Rockford, NP  Active   aspirin  EC 81 MG tablet [id number] Yes Take 81 mg by mouth daily. [provider]  Active   atorvastatin  (LIPITOR) 40 MG tablet [id number] Yes Take 1 tablet (40 mg total) by mouth daily. Nche, Roselie Rockford, NP  Active   dibucaine (NUPERCAINAL) 1 % ointment [id number]  Apply topically as needed. Apply to affected area Angiulli, Toribio PARAS, PA-C  Active   dicyclomine (BENTYL) 20 MG tablet [id number]  Take by mouth. [provider]  Active   FEROSUL 325 (65 Fe) MG tablet [id number] Yes TAKE 1 TABLET BY MOUTH EVERY MORNING  AFTER BREAKFAST Nche, Roselie Rockford, NP  Active   fludrocortisone  (FLORINEF ) 0.1 MG tablet [id number]  Take 1 tablet (0.1 mg total) by mouth daily as needed. If BP <100/60 Nche, Roselie Rockford, NP  Active   gabapentin  (NEURONTIN ) 300 MG capsule [id number] Yes Take 1 capsule (300 mg total) by mouth 3 (three) times daily. Nche, Roselie Rockford, NP  Active   HYDROcodone-acetaminophen  (NORCO/VICODIN) 5-325 MG tablet [id number]  Take 0.5 tablets by mouth every 6 (six) hours as needed. [provider]  Active   midodrine  (PROAMATINE ) 10 MG tablet [id number]  Take 1 tablet (10 mg total) by mouth daily as needed. If BP<100/60  Patient not taking: Reported on 08/30/2024   Katheen Roselie Rockford, NP  Active   midodrine  (PROAMATINE ) 5 MG tablet [id number] Yes Take 5 mg by mouth every 8 (eight) hours. For 30 days [provider]  Active   Multiple Vitamins-Minerals (MENS MULTIVITAMIN PO) [id number] Yes Take by mouth. [provider]  Active   naloxone [facility]) nasal spray 4 mg/0.1 mL [id number] Yes Place 1 spray into the nose once. Up to two doses [provider]  Active   oxyCODONE (OXY IR/ROXICODONE) 5 MG immediate release tablet [id number] Yes Take 5 mg by mouth every 6 (six) hours as needed for severe pain (pain score 7-10) (2.5 - 5 mg as needed for pain up to 14 days). [provider]  Active   pantoprazole  (PROTONIX ) 40 MG tablet [id number] Yes Take 1 tablet (40 mg total) by mouth 2 (two) times daily before a meal. Nche, Roselie Rockford, NP  Active   potassium chloride  SA (KLOR-CON  M) 20 MEQ tablet [id number] Yes TAKE 1 TABLET BY MOUTH EACH  DAY Nche, Roselie Rockford, NP  Active   spironolactone (ALDACTONE) 12.5 mg TABS tablet [id number] Yes Take 12.5 mg by mouth daily. [provider]  Active   trimethoprim -polymyxin b (POLYTRIM) ophthalmic solution [id number]  Place 1 drop into the left eye as needed. [provider]  Active             Home Care and  Equipment/Supplies: Were Home Health Services Ordered?: Yes Name of Home Health Agency:: [facility] Has Agency set up a time to come to your home?: Yes First Home Health Visit Date: 08/28/24 Any new equipment or medical supplies ordered?: Yes Name of Medical supply agency?: [employer] Were you able to get the equipment/medical supplies?: Yes Do you have any questions related to the use of the equipment/supplies?: Yes What questions do you have?: How to use the (hoyer) lift?  Functional Questionnaire: Do you need assistance with bathing/showering or dressing?: Yes Do you need assistance with meal preparation?: Yes Do you need assistance with eating?: Yes Do you have difficulty maintaining continence: Yes Do you need assistance with getting out of bed/getting out of a chair/moving?: Yes Do you have difficulty managing or taking your medications?: Yes  Follow up appointments reviewed: PCP Follow-up appointment confirmed?: Yes Date of PCP follow-up appointment?: 09/24/24 Follow-up Provider: Katheen Roselie Rockford, NP (had to change to another provider) Specialist Hospital Follow-up appointment confirmed?: Yes Date of Specialist follow-up appointment?: 09/02/24 Do you need transportation to your follow-up appointment?: Yes Transportation Need Intervention Addressed By:: AMB Referral For SDOH Needs Do you understand care options if your condition(s) worsen?: Yes-patient verbalized understanding  SDOH Interventions Today    Flowsheet Row Most Recent Value  SDOH Interventions   Food Insecurity Interventions Intervention Not Indicated  Housing Interventions Intervention Not Indicated  Transportation Interventions AMB Referral  [Wife states unable to get patient to any appointments she has NO help and no one to depend on]  Utilities Interventions Intervention Not Indicated    Goals Addressed             This Visit's Progress    [facility] Transitions of Care (TOC) Care Plan       Problems:   Recent Hospitalization for treatment of UTI/Sepsis per DC Summary - History of likely liver cancer, cirrhosis, stroke with hemiplegia, here with ascites and 3+ peripheral edema; possible UTI sepsis Knowledge Deficit Related to post hospital care needs for appointments and medications and SDOH barrier: Transportation  Goal:  Over the next 30 days, the patient will not experience hospital readmission  Interventions:  Transitions of Care: Doctor Visits  - discussed the importance of doctor visits  Patient Self Care Activities:  Attend all scheduled provider appointments Call pharmacy for medication refills 3-7 days in advance of running out of medications Call provider office for new concerns or questions  Notify RN Care Manager of TOC call rescheduling needs Participate in Transition of Care Program/Attend TOC scheduled calls Perform all self care activities independently  Take medications as prescribed   Work with the social worker to address care coordination needs and will continue to work with the clinical team to address health care and disease management related needs Wife concerns for getting him to medical appointments and follow up with PCP for medication refills and care.  Plan:  The patient has been provided with contact information for the care management team and has been advised to call with any health related questions or concerns.  08/30/24 Referral to [facility]BCI BSW for request of transportation review  and assistance for medical appointments. 08/30/24 Discussed and offered 30 day TOC program.  Patient/wife [name] agrees to 30 day telephonic calls and can decline at any time.  The patient has been provided with contact information for the care management team and has been advised to call with any health -related questions or concerns.  The patient verbalized understanding with current plan of care.  The patient is directed to their insurance card regarding availability of benefits  coverage.  Potential for Medicaid application mentioned.         Richerd Fish, RN, BSN, CCM [facility], [facility] Management Coordinator Direct Dial: [phone]

## 2024-09-01 NOTE — ED Provider Notes (Signed)
 [facility] Emergency Department Emergency Department Provider Note  Provider at Bedside:  09/01/2024 11:26 AM  Chief Complaint: Rectal bleeding  History of Present Illness:  History obtained from: Patient   Tyler Richards is a 73 y.o. 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 Patient's wife was changing the patient's brief today around noon when she noticed some bright red blood coming from his rectum. She decided to call EMS due to the patients recent history of septic shock and discharge from the hospital on 08/27/2024. 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 Patient reports that he has had a foley catheter in place for about two weeks since he was first admitted to the hospital. He reports that he is bed bound due to left sided deficits from a CVA. He denies any abdominal pain.  ______________________ ROS: Pertinent positives and negatives per HPI. Pertinent past medical, surgical, social and family history records were reviewed. Current Medications and Allergies were reviewed.  Physical Exam   Vitals:   09/01/24 1119 09/01/24 1122 09/01/24 1251  BP:  104/63 106/65  BP Location:  Right arm   Patient Position:  Lying   Pulse:  85 68  Resp:  (!) 22 20  Temp: 98 F (36.7 C)    TempSrc:  Oral   SpO2:  98% 98%  Weight: 69.1 kg (152 lb 4.8 oz)    Height: 182.9 cm (6')        Physical Exam Constitutional:      General: He is not in acute distress.    Appearance: Normal appearance. He is cachectic.  HENT:     Head: Normocephalic and atraumatic.     Nose: Nose normal.  Pulmonary:     Effort: Pulmonary effort is normal.  Musculoskeletal:        General: Normal range of motion.     Cervical back: Normal range of motion.     Comments: Chronic worsening sacral wound, refer to image below.   Skin:     General: Skin is warm.  Neurological:     General: No focal deficit present.     Mental Status: He is alert and oriented to person, place, and time.     Comments: Paralyzed on left side.  Psychiatric:        Mood and Affect: Mood normal.        Behavior: Behavior normal.     Results   EKG Impression:  My Interpretation: None performed  Labs: Lab Results (last 24 hours)     Procedure Component Value Ref Range Date/Time   Ammonia [[id number]] Collected: 09/01/24 1354   Lab Status: In process Specimen: Blood from Venous Updated: 09/01/24 1357   Lactic Acid With 2 Hour Reflex [[id number]] Collected: 09/01/24 1349   Lab Status: In process Specimen: Blood from Venous Updated: 09/01/24 1357   Prothrombin Time (PT) with INR [[id number]]  (Normal) Collected: 09/01/24 1349   Lab Status: Final result Specimen: Blood from Venous Updated: 09/01/24 1410    Prothrombin Time (PT) 14.2 11.8 - 14.4 seconds     International Normalized Ratio (INR) 1.1 0.0 - 1.5    Urinalysis with Reflex to Microscopic [[id number]]  (Abnormal) Collected: 09/01/24 1219   Lab Status: Final result Specimen: Urine from Clean Catch Updated: 09/01/24 1229    Color, Urine Yellow Yellow  Clarity, Urine Cloudy* Clear     Specific Gravity, Urine 1.026* 1.005 - 1.025     pH, Urine 6.0 5.0 - 8.0     Protein, Urine 70* Negative, 10 , 20  mg/dL     Glucose, Urine Negative Negative, 30 , 50  mg/dL     Ketones, Urine Negative Negative, Trace mg/dL     Bilirubin, Urine Negative Negative     Blood, Urine 2+* Negative, Trace     Nitrite, Urine Negative Negative     Leukocyte Esterase, Urine 250* Negative, 25      Urobilinogen, Urine 2.0* <2.0 mg/dL     WBC, Urine 58-49* <6 /HPF     RBC, Urine >20* 0 - 2 /HPF     Bacteria, Urine Rare None Seen, Rare /HPF     Squamous Epithelial Cells, Urine 0-5 0 - 5 /HPF     Hyaline Casts, Urine 0-2 0 - 2 /LPF     Calcium  Oxalate Crystals, Urine Present* None Seen /HPF    CBC with  Differential [[id number]]  (Abnormal) Collected: 09/01/24 1134   Lab Status: Final result Specimen: Blood from Venous Updated: 09/01/24 1150   Narrative:     The following orders were created for panel order CBC with Differential. Procedure                               Abnormality         Status                    ---------                               -----------         ------                    CBC with Differential[[id number]]       Abnormal            Final result               Please view results for these tests on the individual orders.   Comprehensive Metabolic Panel [[id number]]  (Abnormal) Collected: 09/01/24 1134   Lab Status: Final result Specimen: Blood from Venous Updated: 09/01/24 1213    Sodium 130* 136 - 145 mmol/L     Potassium 3.9 3.4 - 4.5 mmol/L     Chloride 99 98 - 107 mmol/L     CO2 27 21 - 31 mmol/L     Comment: High lactate dehydrogenase (LDH) concentrations in patient samples may cause falsely increased bicarbonate results. If markedly elevated LDH levels are suspected, please assess results in conjunction with patient's LDH values.      Anion Gap 4* 6 - 14 mmol/L     Glucose, Random 117* 70 - 99 mg/dL     Blood Urea Nitrogen (BUN) 14 7 - 25 mg/dL     Creatinine 9.44* 9.29 - 1.30 mg/dL     eGFR >09 >40 fO/fpw/8.26f7     Comment: GFR estimated by CKD-EPI equations(NKF 2021).   Recommend confirmation of Cr-based eGFR by using Cys-based eGFR and other filtration markers (if applicable) in complex cases and clinical decision-making, as needed.      Albumin 2.3* 3.5 - 5.7 g/dL     Total Protein 5.9* 6.4 - 8.9 g/dL     Bilirubin,  Total 0.6 0.3 - 1.0 mg/dL     Alkaline Phosphatase (ALP) 72 34 - 104 U/L     Aspartate Aminotransferase (AST) 23 13 - 39 U/L     Alanine Aminotransferase (ALT) 14 7 - 52 U/L     Calcium  7.4* 8.6 - 10.3 mg/dL     BUN/Creatinine Ratio 25.5* 10.0 - 20.0     Comment: Potential prerenal damage (e.g. CHF, GI Bleeding)      Corrected  Calcium  8.8 mg/dL     Comment: Reference Ranges Not Established. Recommend testing using ionized calcium .     CBC with Differential [[id number]]  (Abnormal) Collected: 09/01/24 1134   Lab Status: Final result Specimen: Blood from Venous Updated: 09/01/24 1150    WBC 8.08 4.40 - 11.00 10*3/uL     RBC 2.86* 4.50 - 5.90 10*6/uL     Hemoglobin 9.1* 14.0 - 17.5 g/dL     Hematocrit 73.8* 58.4 - 50.4 %     Mean Corpuscular Volume (MCV) 91.2 80.0 - 96.0 fL     Mean Corpuscular Hemoglobin (MCH) 31.8 27.5 - 33.2 pg     Mean Corpuscular Hemoglobin Conc (MCHC) 34.9 33.0 - 37.0 g/dL     Red Cell Distribution Width (RDW) 20.6* 12.3 - 17.0 %     Platelet Count (PLT) 382 150 - 450 10*3/uL     Mean Platelet Volume (MPV) 6.9 6.8 - 10.2 fL     Neutrophils % 82 %     Lymphocytes % 6 %     Monocytes % 11 %     Eosinophils % 0 %     Basophils % 1 %     Neutrophils Absolute 6.60 1.80 - 7.80 10*3/uL     Lymphocytes # 0.50* 1.00 - 4.80 10*3/uL     Monocytes # 0.90* 0.00 - 0.80 10*3/uL     Eosinophils # 0.00 0.00 - 0.50 10*3/uL     Basophils # 0.10 0.00 - 0.20 10*3/uL        Interpretation shows that his PT/INR is normal, sodium was 130 his anion gap is 4 his glucose was 117 his albumin and protein are low.  Urinalysis shows a specific gravity of 1.026 with 250 leukocytes 41-50 WBCs per high-power field and greater than 20 RBCs per high-power field.  CBC shows that the H&H was 9.1 and 26.1 which is definitely lower than what his usual H&H are.  CXR Impression: (Interpreted by me) None performed.  Impression of additional imaging studies include: CT abdomen pelvis pending but I do not feel he will have any significant findings that would explain his rectal bleeding.  Imaging: Radiology Results (last 72 hours)     Procedure Component Value Units Date/Time   CT Abdomen Pelvis W Contrast - In process [[id number]] Resulted: 09/01/24 1309   Order Status: Sent Updated: 09/01/24 1321   This result has not  been signed. Information might be incomplete.          Procedures   Procedures  Evidence Based Calculators      ED Course   ED Course as of 09/01/24 1421  Sun Sep 01, 2024  1153 Patient's initial vital signs are stable. [LT]  1153 Intervention-patient is getting routine labs, urinalysis, lipase, and we will consult with GI.  Patient will get a CT scan of the abdomen and pelvis as well to just find any other significant reasons for rectal bleeding.  Per my exam, his stools were normal color and I did not see any  bright red blood in his rectal vault. [LT]  1410 Patient is a CBC showed that his hemoglobin hematocrit was 9.1 and 26.1 which is definitely lower than his usual, her chemistry showed a sodium of 130 of the anion gap of 4 and a BUN and creatinine of 14 and 0.55 but her albumin and protein were low.  Urinalysis show specific gravity greater than 1.026 with 2+ blood 250 leukocytes 41-50 WBCs and greater than 20 RBCs per high-power field.  Patient is going to be given some IV fluids for his obvious dehydration, and hospitalist was consulted for admission so that we can at least have another H&H in the morning to make sure that is not continuing to drop since patient had been having some bright red rectal bleeding earlier today. [LT]  1413 Disposition - admit [LT]    ED Course User Index [LT] Rock Dannielle Birmingham, MD    Medical Decision Making   External records were reviewed: family medicine office visit from 04/11/2024 for iron deficiency anemia, gastrointestinal hemorrhage, cirrhosis of liver, and hypertension follow up.  _________________________  Tyler Richards is a 73 y.o. malewho presents to the ED with complaints of rectal bleeding, onset today. Patient's wife was changing the patient's brief today around noon when she noticed some bright red blood coming from his rectum. She decided to call EMS due to the patients recent history of septic shock and discharge from the  hospital on 08/27/2024. 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 Patient reports that he has had a foley catheter in place for about two weeks since he was first admitted to the hospital. He reports that he is bed bound due to left sided deficits from a CVA.  SABRA On my initial evaluation, patient is in minimal distress, he has a slightly worsening decubitus ulcer on the bilateral mid buttocks and he can see in the media tab and his stools were normal color but I did not see any active rectal bleeding..  The following differentials were considered: DDX: Gastritis, GERD, peptic ulcer disease, cholecystitis, colitis, gastroenteritis, appendicitis, small bowel obstruction, urinary tract infection, mesenteric ischemia.    Pertinent studies were obtained, with results listed in chart above.  results interpreted as above.  Based on ED workup, findings are consistent with pressure ulcers to the bilateral buttocks, rectal bleeding, generalized weakness, acute UTI.    Patient received IV Rocephin  to get started for his UTI.  We have not had to give him anything in particular to stop the rectal bleeding..  On reevaluation, symptoms are improved.  Clinical Assessment/Plan: Patient presented to the ER with rectal bleeding but he is paralyzed from her previous stroke and he has some decubitus ulcers as well that are healing and he has just been generally weaker than usual.  He was found to have an acute UTI as well and his hemoglobin was slightly lower than usual but he never did have any significant extensive rectal bleeding while in the ER.  We have asked the hospitalist to admit and then they can consult GI while the patient is in the hospital.  Discussion of management or test interpretation with external provider(s): Hospitalist was consulted for admission.   Clinical Complexity/Risk   Patient's presentation is most consistent with acute presentation with potential threat to life or  bodily function.  Patient's history of CVA, CAD, HTN increases the complexity of managing their  presentation with rectal bleeding.    Pt presentation is complicated by their  history of Cancer, CVA/TIA, GERD/acid reflux/previous GI bleed, Heart Disease-CAD, MI, stents, etc, and HTN, resulting in increased risk of ACS  Provider time spent in patient care today, inclusive of but not limited to clinical reassessment, review of diagnostic studies, and discharge preparation, was greater than 30 minutes.  OTC medications: no, patient admitted Prescription medications discussed: no, patient admitted  ED Clinical Impression   Diagnoses that have been ruled out:  None  Diagnoses that are still under consideration:  None  Final diagnoses:  Pressure injury of skin of buttock, unspecified injury stage, unspecified laterality  Rectal bleeding  Generalized weakness  Acute UTI    ED Assessment/Plan   ED Disposition     ED Disposition  Admit   Condition  --   Comment  --         DISCHARGE MEDICATIONS   Medication List     ASK your doctor about these medications    acetaminophen  325 mg tablet Commonly known as: TYLENOL  Take 650 mg by mouth.   allopurinoL  100 mg tablet Commonly known as: ZYLOPRIM  Take 100 mg by mouth.   aspirin  81 mg EC tablet Take 81 mg by mouth Once Daily.   atorvastatin  40 mg tablet Commonly known as: LIPITOR Take 40 mg by mouth Once Daily.   docusate sodium  100 mg capsule Commonly known as: COLACE Take 100 mg by mouth.   ferrous sulfate  325 mg (65 mg iron) tablet Take 325 mg by mouth daily.   fludrocortisone  0.1 mg tablet Commonly known as: FLORINEF  Take 0.5 tablets by mouth 2 (two) times a day.   gabapentin  300 mg capsule Commonly known as: NEURONTIN  Take 300 mg by mouth 3 (three) times a day.   pantoprazole  20 mg EC tablet Commonly known as: PROTONIX  Take 20 mg by mouth daily.   polymyxin B sulf-trimethoprim  10,000 unit- 1 mg/mL  ophthalmic solution Commonly known as: POLYTRIM Administer  into affected eye(s).   potassium chloride  20 mEq ER tablet Take 20 mEq by mouth Once Daily for 10 days.   Vitrum Senior Tab Generic drug: geriatric multivit-iron-mins Take  by mouth.        FOLLOW UP No follow-up provider specified.  ____________________________ Scribe's Attestation: This document serves as a record of services personally performed by Rock Birmingham, MD. It was created on their behalf by Karena LOISE Hurst, Scribe, a trained medical scribe. The creation of this record is the providers dictation and/or activities during the visit.   Electronically signed by: Rock Dannielle Birmingham, MD 09/01/2024 11:26 AM      *Some images could not be shown.

## 2024-09-01 NOTE — H&P (Signed)
 ------------------------------------------------------------------------------- Attestation signed by Argentina Otha Olives, MD at 09/02/2024  4:43 PM (Updated) I have  seen  and examined the patient.  I have reviewed the chart including labs and imaging  and have supervised and helped formulate the plan of management with Oliva Quintin April   MD (Resident). I agree with the findings and plan documented.  -------------------------------------------------------------------------------  Hospitalist History & Physical Patient: Elishua Radford  Age: 73 y.o.  Gender: male  MRN: [id number]  Admitted on: 09/01/2024  Attending: Rock Dannielle Birmingham, MD   SUBJECTIVE    Chief Complaint: Rectal bleeding  History of Present Illness Whitt Auletta is a 73 y.o. 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 The patient's wife was changing the patient's briefs today around noon and noticed BRBPR.   Notably, patient is bedbound at baseline (d/t CVA) and has a known saral wound. Patient's wife reports patient has a significant amount of BRBPR. No bright red blood seen by examiner. Also complaining of global body pain, chronic in nature.   Patient has a foley in place for about two weeks since he was first admitted to the hospital   Patient recently admitted to [facility] for septic shock from 08/14/24-08/27/24. He was admitted for abdominal pain/distension, edema, and early satiety. He had 10L removed via paracentesis and had hypotension requiring ICU. He has been covered with ceftriaxone . Aldactone was deferred due to hypotension. At that time referred to palliative care and made DNR/DNI, but still requests outpatient follow up.   He denies  abdominal pain, chest pain, SOB,  vision changes. He states his sacral wound is getting more painful and not healing. He endorses good PO intake. Mild HA.  ED Course Vitals: Afebrile, HR 58, R20, BP 106/65, 98%RA Labs: WBC 8, Hb 9 (baseline 9), Na 130 (baseline 127), Glu 117, Cr 0.55, Alb 2.3. UA w/ 250 LE, 2.0 urobili, 41-50 WBC, >20 RBC, calcium  ox crystals. PT/INR WNL. Lactic 1.9. Ammonia 23 CTAP: Large volume ascites, stable liver cirrhosis, focal nodular consolidation of RML    Abnormal Labs Reviewed  COMPREHENSIVE METABOLIC PANEL - Abnormal; Notable for the following components:      Result Value   Sodium 130 (*)    Anion Gap 4 (*)    Glucose, Random 117 (*)    Creatinine 0.55 (*)    Albumin 2.3 (*)    Total Protein 5.9 (*)    Calcium  7.4 (*)    BUN/Creatinine Ratio 25.5 (*)    All other components within normal limits  CBC WITH DIFFERENTIAL - Abnormal; Notable for the following components:   RBC 2.86 (*)    Hemoglobin 9.1 (*)    Hematocrit 26.1 (*)    Red Cell Distribution Width (RDW) 20.6 (*)    Lymphocytes # 0.50 (*)    Monocytes # 0.90 (*)    All other components within normal limits  URINALYSIS WITH REFLEX TO MICROSCOPIC - Abnormal; Notable for the following components:   Clarity, Urine Cloudy (*)    Specific Gravity, Urine 1.026 (*)    Protein, Urine 70 (*)    Blood, Urine 2+ (*)    Leukocyte Esterase, Urine 250 (*)    Urobilinogen, Urine 2.0 (*)    WBC, Urine 41-50 (*)  RBC, Urine >20 (*)    Calcium  Oxalate Crystals, Urine Present (*)    All other components within normal limits     PMH Medical History[1]   PSH Surgical History[2]   Allergies Allergies[3]   Meds Medications Ordered Prior to Encounter[4]   Social history Social Connections: Moderately Integrated (12/29/2021)   Received from [facility]   Social Connection and Isolation Panel    In a typical week, how many times do you talk on the phone with family, friends, or neighbors?: Twice a  week    How often do you get together with friends or relatives?: Twice a week    How often do you attend church or religious services?: More than 4 times per year    Do you belong to any clubs or organizations such as church groups, unions, fraternal or athletic groups, or school groups?: No    How often do you attend meetings of the clubs or organizations you belong to?: Never    Are you married, widowed, divorced, separated, never married, or living with a partner?: Married     Family History[5]    OBJECTIVE    Vitals:   09/01/24 1251  BP: 106/65  Pulse: 68  Resp: 20  Temp:   SpO2: 98%     Physical Exam General: cachectic  HEENT: Normocephalic, Atraumatic, EOMI, MMM Respiratory: CTAB, no wheeze/rhonchi/rales appreciated, adequate inspiratory effort Cardiovascular: regular rate and regular rhythm, +S1/S2; no murmurs, rubs, or gallops Extremities: WWP, 1+ RLE edema, 2+ LLE edema Abd: Distendend, non-tender Skin: Dark, erythematous stage 2 sacral ulcer in multiple confluent patches, non-erythematous Neuro: Aox4, L-sided paralysis    Recent CBC    09/01/24 1134  WBC 8.08  RBC 2.86*  HGB 9.1*  HCT 26.1*  MCV 91.2  MCHC 34.9  RDW 20.6*  PLT 382  MPV 6.9          See other pertinent labs in the chart:  Imaging: US  Guided Abdominal Paracentesis Narrative: INDICATION:  73 year old male with abdominal distention and ascites for diagnostic and therapeutic paracentesis.   EXAM:   ULTRASOUND GUIDED  PARACENTESIS     MEDICATIONS:   1% Lidocaine  10 mL   COMPLICATIONS:   None immediate   PROCEDURE:   Informed written consent was obtained from the patient after a discussion of the risks, benefits and alternatives to treatment. A timeout was performed prior to the initiation of the procedure.   Initial ultrasound scanning demonstrates a large amount of ascites within the left lower abdominal quadrant. The left lower abdomen was prepped and draped in  the usual sterile fashion. 1% lidocaine  was used for local anesthesia.   Following this, a Safe T Centesis catheter was introduced. An ultrasound image was saved for documentation purposes. The paracentesis was performed. The catheter was removed and a dressing was applied. The patient tolerated the procedure well without immediate post procedural complication.    FINDINGS:   A total of approximately 4.5 L of clear, yellow fluid was removed.    Impression:   Successful ultrasound-guided paracentesis yielding 4.5 L of peritoneal fluid.   Procedure performed by Abigail C. Augusta, PA-C    No orders to display     ASSESSMENT & PLAN  Saber Dickerman is a 73 y.o. 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  #BRBPR, questionable #CRC w/ mets to liver c/b cirrhosis (resection in 2022, then radiation in 2024) #Chronic hypotension - Patient's wife noticed BRBPR this AM when changing briefs. Not seen on exam - Hb stable in 9s, PT/INR/PLT WNL - CTAP w/ large-volume ascites, stable cirrhosis, possible nodular consolidation in RML - Ddx: progression of rectal cancer vs bleeding ulcer vs diverticulitis vs angiodysplasia vs hemorrhoid vs diverticular bleed ----- PLAN: -- IR consult in AM for paracentesis -- Reengage palliative care -- SBP prophylaxis w/ zosyn as below -- Continue florinef , can start midodrin 5mg  TID If necessary (previously on it) -- C/w home PO PPI   #Sacral wound, chronic  #Bedbound 2/2 CVA (L-sided paralysis) #Severe protein-calorie malnutrition - Dark, erythematous stage 2 sacral ulcer in multiple confluent patches, non-erythematous - Afebrile, without leukocytosis - Appears cachectic w/ albumin  2.3 - Worsening per patient and not healing ----- PLAN: -- Wound care consult -- Vanc and zosyn -- F/u wound culture -- F/u Blood culture -- Reasonable to start BSA x1 day -- F/u PT, OT, RD -- F/u cystatin C   #Contaminated UA - UA from foley with 250 LE, no nitrites; had calcium  oxalate crystals, 41-50 WBC, >20 RBC - Has had foley in for 2 weeks - No signs of fever or leukocytosis with lactic of 1.9 - Low concern for true UTI given foley has been for 2 weeks ----- PLAN: -- Re-obtain UA & UCx -- Exchange foley  -- Defer abx at this time  #Chronic hyponatremia, volemic - Na 127 in November, 130 today, improved - Appears euvolemic/hypovolemic --- PLAN: - F/u Uosm, Ulytes - Assess diuretic needs daily (f/u med rec)  Chronic Health Conditions: #CAD: reasonable to continue ASA #Polyarticular gout: C/w allopurinol    #DVT PPx: hold in s/o possible bleeding active #FEN: low sodium #Ethics: DNR-L Problem List Items Addressed This Visit   None Visit Diagnoses       Pressure injury of skin of buttock, unspecified injury stage, unspecified laterality    -  Primary     Rectal bleeding         Generalized weakness         Acute UTI            Oliva April, MD [facility] Internal Medicine, PGY-3 Electronically signed by: Oliva Quintin April, MD 09/01/2024 2:40 PM        [1] Past Medical History: Diagnosis Date   Colon cancer    (CMD)    rectal cancer   Hypertension    Stroke    (CMD)    Transfusion history   [2] Past Surgical History: Procedure Laterality Date   COLON SURGERY    [3] Allergies Allergen Reactions   Mirtazapine  Other (See Comments)    hallucination, hallucination  hallucination    hallucination, hallucination    hallucination  hallucination, hallucination  hallucination  hallucination, hallucination  hallucination    hallucination, hallucination    hallucination  hallucination, hallucination    hallucination  hallucination,  hallucination  hallucination  hallucination, hallucination  hallucination  hallucination, hallucination  hallucination, hallucination  hallucination  hallucination, hallucination  hallucination  hallucination, hallucination  hallucination  hallucination, hallucination    hallucination, hallucination  hallucination  hallucination, hallucination  hallucination  hallucination, hallucination  hallucination  hallucination, hallucination  [4] No current facility-administered medications on file prior to encounter.   Current Outpatient Medications on File Prior to Encounter  Medication Sig Dispense Refill  acetaminophen  (TYLENOL ) 325 mg tablet Take 650 mg by mouth.     allopurinoL  (ZYLOPRIM ) 100 mg tablet Take 100 mg by mouth.     aspirin  81 mg EC tablet Take 81 mg by mouth Once Daily.     atorvastatin  (LIPITOR) 40 mg tablet Take 40 mg by mouth Once Daily.     docusate sodium  (COLACE) 100 mg capsule Take 100 mg by mouth. (Patient taking differently: Take 100 mg by mouth 2 (two) times a day as needed for constipation.)     ferrous sulfate  325 mg (65 mg iron) tablet Take 325 mg by mouth daily.     fludrocortisone  (FLORINEF ) 0.1 mg tablet Take 0.5 tablets by mouth 2 (two) times a day.     gabapentin  (NEURONTIN ) 300 mg capsule Take 300 mg by mouth 3 (three) times a day.     multivitamine, geriatric, Ship Broker) tab Take  by mouth.     pantoprazole  (PROTONIX ) 20 mg EC tablet Take 20 mg by mouth daily.     polymyxin B sulf-trimethoprim  (POLYTRIM) ophthalmic solution Administer  into affected eye(s).     potassium chloride  (KLOR-CON ) 20 mEq ER tablet Take 20 mEq by mouth Once Daily for 10 days. 10 tablet 0  [5] Family History Problem Relation Name Age of Onset   Cancer Mother     Cancer Sister

## 2024-09-03 ENCOUNTER — Inpatient Hospital Stay: Admitting: Nurse Practitioner

## 2024-09-03 NOTE — Progress Notes (Signed)
°  Case Management     Patient:   Tyler Richards MR Number:  [id number] Patient Date of Birth: [birth year] Age/Sex:  73 y.o./male     Writer updated wife that [facility] is no longer open- they are now owned by [employer] [phone]). Per [facility] patient needs a referral for Home Based Primary care and notes to support patient is home bound. They will review the referral and call the patients wife to schedule with in 1-2 weeks of receiving referral. Writer spoke with wife and updated- per wife patient is currently admitted at [facility]. Writer advised to have them send referral and higher education careers adviser if they need assistance.    Electronically signed: Rosaline CHRISTELLA Estrin, RN 09/03/2024 / 12:13 PM

## 2024-09-03 NOTE — Consults (Signed)
 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 Called pt but no answer. MST:0. Ensure Plus TID in place. Pt with distended abdomen early this AM per nursing documentation. No nausea,vomiting,abdominal pain noted. US  guided paracentesis from right lateral abdomen 12/8, yielded 5.5 L per IR note.   Interventions  Nutrition Intervention: Collaboration and referral of nutrition care, Continue current nutrition regimen, Medical food supplements, Vitamin and/or mineral supplements  Continue current diet as ordered Continue Ensure Plus TID Magic Cup BID Encourage PO intake Consider multivitamins daily Recommend monitor of lytes and replete as indicated      Nutrition Diagnosis                        Nutrition Diagnosis: Inadequate oral intake Problem Related To: Acute illness Problem as Evidenced By: 33% of meal   Nutrition Diagnosis 2: Increased nutrient needs Problem Related To 2: Wound Problem as Evidenced By: Sacrum extending to the left buttocks pressure Injury- Unstageable, Right ischium pressure Injury-Deep tissue Injury   Nutrition Focused Physical Findings  Deferred Due To: Other (Remote)  Muscle Mass Depletion:     Body Fat Depletion:              Pertinent Information   Weights (last 14 days)     Date/Time Weight   09/01/24 1119 69.1 kg (152 lb 4.8 oz)       Wt Readings from Last 20 Encounters:  09/01/24 69.1 kg (152 lb 4.8 oz)  05/23/24 71.2 kg (157 lb)  05/22/24 71.2 kg (157 lb)  05/13/24 70.3 kg (155 lb)   03/06/24 70.3 kg (155 lb)  02/05/24 69.4 kg (153 lb)  11/09/23 67.1 kg (148 lb)  06/26/23 65.2 kg (143 lb 12.8 oz)  05/01/23 66.7 kg (147 lb)  04/05/23 66.6 kg (146 lb 14.4 oz)  03/22/23 64.9 kg (143 lb)  03/02/23 58.5 kg (129 lb)  02/10/23 58.5 kg (129 lb)  01/12/23 68 kg (150 lb)  07/15/22 67.6 kg (149 lb)  07/14/22 65.8 kg (145 lb)  07/01/22 65.8 kg (145 lb)  03/28/22 66.7 kg (147 lb)  01/03/22 66.5 kg (146 lb 11.2 oz)  12/21/21 65.3 kg (144 lb)   RLE Edema +3  LLE Edema +3  Sacral +2   Current weight most likely affected by fluids.   Weight History/Comments: . Weight Classification: For adult > 52 years old, Underweight BMI: 20.66 Skin Integrity: Sacrum extending to the left buttocks: Pressure Injury: Unstageable, Right ischium: Pressure Injury: Deep tissue Injury per wound note Nutrition Related Medications: LIPITOR,ferrous sulfate ,gabapentin ,omeprazole ,oxyCODONE,zosyn Labs: Na:130,Ca:7.5,Mg:1.7 GI Symptoms: None Last BM Date:  (PTA)    Current Diet and Nutrition Support Details  Diet 2gm Na. Meal intake: 100%x1,33%x1 Ensure Plus TID  Enteral Nutrition                 Parenteral Nutrition           Total Regimen Provides      Estimated Needs  Nutrition Needs Based On: IBW (77.6 kg)    Calories (kcal/day): 7671-7283  Calories based  on: 30-35 kcal/kg  Protein (g/day): 93-116  Protein based on: 1.2-1.5g/kg  Fluid (mL/day):   1-1.1 mL/kcal, Per MD      Monitoring & Evaluation  Nutrition Goals: Adequate PO, Other (comment), Maintain nutritional status, GI tolerance, Appropriate wound healing Florie leos) Nutrition Goal Status: Initial   Follow-up Information  Follow-Up Date: 09/10/24 Follow-Up Reason: Reassessment   Contact RD on treatment team. After hours or weekends, use secure chat group: [facility]  [facility] Adult Dietitians [place]  [facility] Dietitians [facility]  [facility] Dietitians [facility]  [facility] Dietitians [facility]  [facility] Dietitians   Marvis Killian,  RD 09/03/2024 11:44 AM

## 2024-09-03 NOTE — Progress Notes (Signed)
°   09/03/24 1105  Reason Eval/Treat Not Completed  Reason Eval/Treat Not Completed Other (OT orders received but will await the Palliative Care consult to determine goals of care as requested by MD yesterday.)

## 2024-09-03 NOTE — Progress Notes (Signed)
°   09/03/24 1209  Reason Eval/Treat Not Completed  Reason Eval/Treat Not Completed Physical therapy evaluation orders received but will await the Palliative Care consult to determine goals of care as requested by MD yesterday. Will continue efforts.

## 2024-09-03 NOTE — Progress Notes (Signed)
 Case Management Adult Assessment  CSN: [id number] DOB: July 07, 1951 Service: General Medicine Location: 715/01   Info & Contacts Assessment Completed: In-person interview with Patient, In-person interview with (comment) (Wife Ms Difrancesco at bedside) The patient's status at this time is:: Unable to communicate Unable to communicate related to:: Other (speech hard to understand) Prior to admission, patient resided at: Private residence Prior to admission, patient lived with : Spouse/Significant Other The patient's decision maker is:: Other If the patient is unable to make decisions per MEDICAL RECORD NUMBERSpouse/Partner (name, phone #): At discharge, will patient return to prior residence: Yes (Wife is declining any type of placement) Barriers to education: Physical   Extended Emergency Contact Information Primary Emergency Contact: Stuckey,Carolyn Address: [address]          [place], [place] [address] [place] Home Phone: [phone] Mobile Phone: [phone] Relation: Spouse  Assessment Is this patient at baseline?: Unknown Was patient independent with ADLs prior to admission?: No Was patient independent with mobility prior to admission?: No Does this patient have or need any DME?: Yes Patient has the following DME: Vannie Roughen, Bedside commode, Hospital bed (semi-electric), Deitra lift, Shower chair Does this patient have or need any home health services?: Yes Patient has the following home health services: PT, Skilled nurse Does this patient have or need any personal care services?: No   Social Does patient have a mental health diagnosis?: No Does patient have an acute substance use issue?: No   Discharge How will patient obtain prescription meds at discharge:: Medicare Part D Type of Payer Source:: Medicare How will this patient obtain follow-up care after discharge?: Other (comment) (Referral to be given to [facility] [phone]) How will this patient  reach the discharge destination?: Ambulance Is this a Chronic Dialysis patient?: No Patient Discharge Goals of Care: Increased Mobility/Self Care, Not to Return to the Hospital, Learning about Disease Management  Discharge Education Discharge Plan Discussed With:: Patient, Significant Other Education Readiness:: Acceptance Education Method:: Explanation Education Response:: Verbalizes Understanding Education Comment on Points:: HH discussed as pt is active with PT and SN/Also requesting a referral to [facility] as pt is home bound        Spoke to Kendrick regarding HH DC needs as pt was active.                Case Management Coordination Status: Coordination In-Progress    Anticipated Discharge Location: Home with Home Health  If Plan A discharging location is not feasible: Potential Plan B: Home with Home Health         Dorothe VEAR Hover, RN

## 2024-09-04 ENCOUNTER — Telehealth: Payer: Self-pay

## 2024-09-04 NOTE — Telephone Encounter (Signed)
 Referring provider: Rosario Medic visit 12-13 for vol status eval, BP check   Tyler Richards Patient Care Advocate [facility] at [facility])  Secure Chat Group: [facility] Telehospitalist  [phone]

## 2024-09-04 NOTE — Telephone Encounter (Addendum)
 error

## 2024-09-04 NOTE — Consults (Signed)
 Palliative Care Consult Note   Date of service: 09/04/2024 Consultation from Requesting Attending Physician: Glo Stanly Oris, MD Length of stay:  3 PCP:  Roselie Bishop Mood, NP Service Requesting Consult: Hospital Medicine  Location at Time of Consult: Medical  Reason for Consult: Inpatient consult to Palliative Care Consult performed by: Alm Carlin Ada, DO Consult ordered by: Glo Stanly Oris, MD    Goals of care Pain management Diagnosis:Gastrointestinal  Assessment/Plan:  Summary: This 73 y.o. patient is seriously and acutely ill due to progress with hemiplegia and decompensated cirrhosis --DNR/limited confirmed - Patient and wife are interested in continuing to medically manage his cirrhosis, continue serial imaging and paracentesis as needed as well as following up with other specialist appointments. - Referral is getting placed for [facility]'s Palliative program and our inpatient Palliative team's contact information was provided in case they need assistance in the future.  They are also requesting home health physical therapy.  Transportation assistance which is being coordinated by CCM  Symptom Assessment:  Pain Secondary to abdominal distention and paracentesis with postprocedural abdominal wall spasms.  In addition to serial paracentesis we will trial medications as below to see if benefit outweighs risk of somnolence and sedation PDMP reviewed 24 hr OME: 0 - Oxycodone discontinued and added to allergy list for hallucinations - Start low-dose liquid morphine  2.5 mg every 4 hours as needed for moderate-severe pain - Trial of methocarbamol 500 mg every 4 hours as needed  Bowel regimen - No constipation at this time however initiation of once daily MiraLAX  or lactulose would be reasonable, will continue to monitor to see if needed   Advance Care Planning   Goals of Care/Decision Making Assessment/Recommendations: Decision making capacity: Yes Healthcare  Decision Maker if lacks capacity: Wife [name]  GOC summary: We were able to discuss patient's clinical course since his stroke 4 years ago.  Wife has been exceptionally supportive and has taken on caregiver role.  At home they have a good routine but they do need help.  Patient and wife both want to pursue physical therapy and home health services as well as any palliative services to make sure he is not suffering if his ascites becomes worse or has greater burden of disease. DNR/limited confirmed We discussed PleurX catheter placement which wife had already determined that is something they would not pursue.  I was able to share my insight that the burden of paracentesis needs to be great to justify a PleurX catheter considering the severe and life-threatening infection risk, most commonly this care plan involves hospice but it is always a case-by-case basis. They are set up with [facility]'s palliative program who could help them with ongoing goals of care and symptom management however considering they just had a lapse in trying to establish with [facility] I provided our inpatient team's contact information so that if they need us  as a resource in the future they can always reach out      Practical, Emotional, Spiritual Support Assessment and Recommendations: Social support: Patient's wife is complete support Personhood/values: Life-prolonging in maintaining quality of life where he can eat and enjoy activities from bed, hopefully stand again Palliative Care will continue to follow this patient. These recommendations have been discussed with the primary team and supporting staff caring for this patient.  Thank you for this consult.  Please contact consulting provider or secure chat [facility] Palliative Care 8a-5p M-F [facility] Palliative Care after hours/weekends) with any questions/needs.  Subjective:  HPI: Mr. Kimmons is a  73 y.o. 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   Current status:  On exam patient lying in bed awake alert no confusion able to answer all questions and follow commands.  Wife at bedside.  He is in no distress but he did just request pain medication.  He was prescribed oxycodone for abdominal pain from [facility] however it made him sleepy and hallucinate.  This is why he has been refusing this medication since admission.  Pain is spasmodic feeling located at the paracentesis site.  Pain is intermittent not constant not worse with eating or movement.  Normal bowel movements.  Goals of care discussion as above    Other relevant Review of Systems detailed in HPI  SUD hx:  Previous Substance Abuse: No Objective:   Palliative Performance Scale (PPS) at time of consult: 30% bed bound, can't do any work, extensive disease evidence, total care, reduced intake, full or drowsy or confusion.; \ eFI: 0.077 (Percentile = 38)   Visit Vitals BP (!) 78/54  Pulse 60  Temp 97.5 F (36.4 C) (Oral)  Resp 17  SpO2 100%     Weight change in the last 24 hours: Unable to Calculate   Physical Exam: General: Ill appearing male resting in bed, NAD, easily aroused, answering questions appropriately HEENT:Normocephalic, trachea midline CV: No JVD or cyanosis Resp: Non-labored breathing, normal rate, no congestion or cough GI:Non-distended Skin: No visible areas of rash or skin breakdown Neuro: hemiplegic Psych: No hallucination, no anxiety    Testing reviewed and interpreted: YES Lab Results (last 24 hours)     Procedure Component Value Ref Range Date/Time   Basic Metabolic Panel [[id number]]  (Abnormal) Collected: 09/04/24 0727   Lab Status: Final result Specimen: Blood  from Venous Updated: 09/04/24 0804    Sodium 131* 136 - 145 mmol/L     Potassium 3.3* 3.4 - 4.5 mmol/L     Chloride 101 98 - 107 mmol/L     CO2 27 21 - 31 mmol/L     Comment: High lactate dehydrogenase (LDH) concentrations in patient samples may cause falsely increased bicarbonate results. If markedly elevated LDH levels are suspected, please assess results in conjunction with patient's LDH values.      Anion Gap 3* 6 - 14 mmol/L     Glucose, Random 114* 70 - 99 mg/dL     Blood Urea Nitrogen (BUN) 17 7 - 25 mg/dL     Creatinine 9.33* 9.29 - 1.30 mg/dL     eGFR >09 >40 fO/fpw/8.26f7     Comment: GFR estimated by CKD-EPI equations(NKF 2021).   Recommend confirmation of Cr-based eGFR by using Cys-based eGFR and other filtration markers (if applicable) in complex cases and clinical decision-making, as needed.      Calcium  7.6* 8.6 - 10.3 mg/dL     BUN/Creatinine Ratio 25.8* 10.0 - 20.0     Comment: Potential prerenal damage (e.g. CHF, GI Bleeding)     CBC without Differential [[id number]]  (Abnormal) Collected: 09/04/24 0727   Lab Status: Final result Specimen: Blood from Venous Updated: 09/04/24 0743   Narrative:     The following orders were created for panel order CBC without Differential. Procedure  Abnormality         Status                    ---------                               -----------         ------                    CBC without [id number]    Abnormal            Final result               Please view results for these tests on the individual orders.   Magnesium  [[id number]]  (Abnormal) Collected: 09/04/24 0727   Lab Status: Final result Specimen: Blood from Venous Updated: 09/04/24 0804    Magnesium  1.5* 1.9 - 2.7 mg/dL    CBC without Differential [[id number]]  (Abnormal) Collected: 09/04/24 0727   Lab Status: Final result Specimen: Blood from Venous Updated: 09/04/24 0743    WBC 7.11 4.40 - 11.00 10*3/uL     RBC 2.62* 4.50 -  5.90 10*6/uL     Hemoglobin 8.1* 14.0 - 17.5 g/dL     Hematocrit 76.0* 58.4 - 50.4 %     Mean Corpuscular Volume (MCV) 91.2 80.0 - 96.0 fL     Mean Corpuscular Hemoglobin (MCH) 31.0 27.5 - 33.2 pg     Mean Corpuscular Hemoglobin Conc (MCHC) 33.9 33.0 - 37.0 g/dL     Red Cell Distribution Width (RDW) 19.7* 12.3 - 17.0 %     Platelet Count (PLT) 291 150 - 450 10*3/uL     Mean Platelet Volume (MPV) 6.9 6.8 - 10.2 fL         Medications reviewed: Aeronautical Engineer and Medical Decision Making Medical Decision Making contribution : During today's visit, I personally addressed the following that applies toward Medical Decision Making:Complexity: The following are high risk issues for medical decision making in this patient: Chronic illness with severe exacerbation Illness that poses threat to life or body function Decision for DNR or de-escalate care  Discussed management or test interpretation with Dr. Rosario. PDMP: I have reviewed the Statewide Controlled Substance Reporting System for this patient.  Face to face ACP time spent: 18  Alm JAYSON Ada, DO   *Some images could not be shown.

## 2024-09-04 NOTE — Progress Notes (Addendum)
 Case Management Update  Date: 09/04/2024   Time: 2:49 PM   Patient Type: Inpatient I reached out to Rocky Buff with [facility] to discuss pts status/DC/Homebound needs. Rocky will follow up with wife to see if she is interested in [facility] care providing ALL services in the home including home based physicians and palliative/hospice services along with [facility]. I will await her call back once she explains her services.   Rocky Buff with  [facility] reached out to wife regarding all inclusive home based services and she is agreeable. I have updated both HOP Roxanne and Theresa with [facility] of this. Provider is also aware.     Case Management Coordination Status: Coordination In-Progress    Anticipated Discharge Location: To be determined  Dorothe VEAR Hover, RN

## 2024-09-04 NOTE — Progress Notes (Signed)
 Physician's Medical Necessity Certification Form (Required for Medicare / Medicaid Beneficiaries for non-emergency, scheduled or non-scheduled, ambulance transportation)  Ambulance Company and Contact #: [facility]  [phone])  Patient's Name: Tyler Richards Social Security Number: [id number] Address: [address] [place] [address] Phone Number: [phone] (home)  Transfer Information  Date of Service: 09/04/24 Origin of Transport:    Transport Destination:  [address] Phone:   City: [place] State: [place] Please Check: Residence  Medical Guidelines  Medicare Regulations, Part 410.40 (d)(1) defines Medical Necessity and Bed Confined In order for ambulance service to be covered, they must be medically necessary and reasonable.  Medical necessity is established when the patients' condition is such that transportation by other means is contraindicated.  The Health Care Financing Administration has defined Bed Confinement as: Patient is unable to get up from bed without assistance Patient is unable to ambulate Patient is unable to sit in a chair or wheelchair  If the said patient does not meet Bed Confined criteria as defined in the Medicare regulation, can this patient be transported safely unmonitored in a wheelchair van?  No  If No, please indicate the appropriate medical condition(s):  Immobilization, Contractures, Severe Weakness, Stroke (High Risk for FAlls/High Risk to self and others/liver mets)  ---------------------------------------------------------------------------------------------------------------------- I certify that the above information is true and correct based on my evaluation of this patient to the best of my knowledge and professional training, as supported in the medical record of this patient.   I understand that this information will be used by the Department of Health and Health And Safety Inspector, Healthcare Financing Administration  to support the determination of medical necessity for ambulance services.  I certify that the above information is true and correct based on my evaluation of this patient to the best of my knowledge and professional training, as supported in the medical record of this patient.  It is understood that I, the Medical Professional; being a Designer, Jewellery, Case Designer, Television/film Set, has received a verbal order from the assigned physician, being Dr. Glo Oris on the Date of 09/04/2024, to authorize the medical necessity for the transport of the said patient above.  I understand that this information will be used by the Department of Health and Health And Safety Inspector, Healthcare Financing Administration to support the determination of medical necessity for ambulance services.    Signature of Medical Professional:  Dorothe VEAR Hover, RN Date:  09/04/2024

## 2024-09-04 NOTE — Progress Notes (Signed)
 Case Management Discharge Note        CSN: [id number] DOB: 1951-05-05 Service: General Medicine Location: 715/01  Patient Class: Inpatient  DC Disposition: : Home Health Care  Discharge DC Disposition: : Home Health Care Referrals: Other (comment) [facility]) Homecare/Hospice Agency(s) chosen:  [facility]) Discharge Transport: Ambulance Discharge Transport Agency Chosen: [facility]  [phone])  Discharge Referrals Patient Preference: Chosen geographical local area/county shared with patient/family:  (Switched to [facility]) Patient Preference for Post-Acute Provider Form completed: Return/Previous Involvement       Case Management Coordination Status: Coordination Complete     Dorothe VEAR Hover, RN

## 2024-09-05 ENCOUNTER — Telehealth: Payer: Self-pay

## 2024-09-05 NOTE — Transitions of Care (Post Inpatient/ED Visit) (Signed)
° °  09/05/2024  Name: DARIN ARNDT MRN: [id number] DOB: Sep 01, 1951  Today's TOC FU Call Status: Today's TOC FU Call Status:: Successful TOC FU Call Completed TOC FU Call Complete Date: 09/05/24  Patient's Name and Date of Birth confirmed. Name, DOB  Transition Care Management Follow-up Telephone Call Date of Discharge: 09/04/24 Discharge Facility: Other ([facility] Facility) Name of Other (Non[facility]) Discharge Facility: [facility] Type of Discharge: Inpatient Admission Primary Inpatient Discharge Diagnosis:: Sepsis How have you been since you were released from the hospital?: Better Any questions or concerns?: No  Items Reviewed: Did you receive and understand the discharge instructions provided?: Yes Medications obtained,verified, and reconciled?: No (Wife states, I don't have time this morning I'm getting thigs set up and we have everything he needs set up from [facility] with getting home care even with doctors) Any new allergies since your discharge?: No Do you have support at home?: Yes People in Home [RPT]: spouse Name of Support/Comfort Primary Source: Spouse - [name] - on DPR and patient with stroke and wife is primary care giver  Medications Reviewed Today: Medications Reviewed Today   Medications were not reviewed in this encounter     Home Care and Equipment/Supplies: Were Home Health Services Ordered?: Yes Name of Home Health Agency:: [facility] and a doctor (Hospitalist) coming out 09/07/24 to see him Has Agency set up a time to come to your home?: Yes First Home Health Visit Date: 09/07/24 Any new equipment or medical supplies ordered?: No  Functional Questionnaire: Do you need assistance with bathing/showering or dressing?: Yes Do you need assistance with meal preparation?: Yes Do you need assistance with eating?: Yes Do you have difficulty maintaining continence: Yes Do you need assistance with getting out of bed/getting out of a  chair/moving?: Yes Do you have difficulty managing or taking your medications?: Yes  Follow up appointments reviewed: PCP Follow-up appointment confirmed?: No (Wife, [name] states, he will have a doctor coming out to make home visits as his primary care with the [facility] people, who is setting up everything he needs) MD Provider Line [phone] Given: No (Patient to have [facility] Home Physician) Do you need transportation to your follow-up appointment?: Yes (He will have everything for him coming here as he will not need transportation per wife)  Education for self-mgmt of  post hospital follow up needs  provided during this outreach regarding: -s/s of worsening condition and when to seek medical attention -importance of completing all post discharge hospital hospital follow up appts -adherence to med regimen [facility] TOC 30-day program enrollment reviewed and discussed with pt/caregiver. They have declined enrollment in 30 day TOC program due to: Wife states patient is enrolling in [facility] program for PCP and palliative/hospice follow up. Patient will have HH following and a lot of help in the home and no needs. Wife states she will notify PCP Nche, Roselie Rockford, NP regarding their decision to have a home-based PCP now.   Richerd Fish, RN, BSN, CCM [facility] Management Coordinator Direct Dial: [phone]

## 2024-09-06 ENCOUNTER — Telehealth: Payer: Self-pay | Admitting: Nurse Practitioner

## 2024-09-06 NOTE — Telephone Encounter (Signed)
 Patient dropped off document Home Health Certificate (Order ID [id number]), to be filled out by provider. Patient requested to send it back via Fax within 7-days. Document is located in providers tray at front office.Please advise at [phone]

## 2024-09-10 NOTE — Telephone Encounter (Signed)
 Called [facility] and spoke with Jess and informed her reason of my call. She had to speak with a different case manger to ask if it would be okay for [facility] physician supervisor to sign the home health certification. She informed me that they would need the name to have the paper work re-faxed over to our office to have the physician to sign. I thanked her for taking my call and stated I will be on the look out for the forms with Dr. Tresa name

## 2024-09-11 ENCOUNTER — Ambulatory Visit: Payer: Self-pay

## 2024-09-11 NOTE — Telephone Encounter (Signed)
 FYI Only or Action Required?: FYI only for provider: update.  Patient was last seen in primary care on 04/11/2024 by Nche, Roselie Rockford, NP.  Called Nurse Triage reporting Hypotension.  Triage Disposition: Go to ED Now (Notify PCP)  Patient/caregiver understands and will follow disposition?: Yes Tyler Richards states understanding if this happens again)      Copied from CRM #[id number]. Topic: Clinical - Medical Advice >> Sep 10, 2024 10:06 AM Tyler Richards wrote: Reason for CRM: Tyler Richards called in regarding patient Patient is blood pressure is 77/48, fine no weakness, dizziness or fatigue  feels like his normal self   [id number] Reason for Disposition  [1] Systolic BP < 80 AND [2] NOT feeling weak or lightheaded  Answer Assessment - Initial Assessment Questions This RN spoke with Tyler Richards, pt's home health nurse. Yesterday pt's BP was 77/48 with no symptoms. Per Tyler Richards, pt's parameters are low and they are told to notify PCP only if SBP <80 and DBP <50. Tyler Richards is not with pt and has not checked pt's BP today. This RN educated Neka on new-worsening symptomss and when to call back/seek emergent care. Neka verbalized understanding and agrees to plan. This RN will send HP to clinic.  Per chart review, pt had a telemedicine visit today with BP 94/54.  Protocols used: Blood Pressure - Low-A-AH

## 2024-09-12 ENCOUNTER — Telehealth: Payer: Self-pay

## 2024-09-12 NOTE — Telephone Encounter (Signed)
 Patient dropped off document Home Health Certificate (Order ID [id number]), to be filled out by provider. Patient requested to send it back via Fax within 7-days. Document is located in providers tray at front office.Please advise at [phone]

## 2024-09-13 NOTE — Telephone Encounter (Signed)
 Copied from CRM [id number]. Topic: Medical Record Request - Other >> Sep 13, 2024  1:12 PM Suzen RAMAN wrote: Reason for CRM: Patient is currently in the hospital and patient spouse was advise to contact the office to have provider Nche complete a transportation accomodation form for patient to assist with getting him to the office and also to his other upcoming appt.  Patient spouse not sure of the exact name of form or how the process for this works but would like a call back to further discuss.   CB#[phone]

## 2024-09-16 NOTE — Telephone Encounter (Signed)
"   CLINICAL USE BELOW THIS LINE (use X to signify taken)  ____Form received and placed in providers office for signature. _X___Form completed and faxed to home health agency. ____Form completed & LVM to notify pt ready for pick up _X___Charge sheet & copy of form in front office folder for office supervisor.   "

## 2024-09-17 NOTE — Telephone Encounter (Signed)
 Called and left a voice message for patient's wife Gaspar Fowle who is on DPR on file asking to give me a call back at the office.

## 2024-09-17 NOTE — Telephone Encounter (Signed)
 Patient's wife returned my call. I informed her that we do not carry a transportation form and that patients and their family will bring in transportation forms to have filled out by the PCP. I informed her that I have researched 2 places that she can call and she can inform them what it is she is needing and proceed from there. She thanked me for doing that and she wrote down the name and numbers for [facility] [id number] and [facility] [phone]. She again thanked me for doing this much for her.

## 2024-09-24 ENCOUNTER — Inpatient Hospital Stay: Admitting: Family Medicine

## 2024-09-24 NOTE — Progress Notes (Signed)
 Case Management Discharge Note        CSN: [id number] DOB: January 23, 1951 Service: General Medicine Location: 671/01  Patient Class: Inpatient  DC Disposition: Hospice - Home  Discharge DC Disposition: Hospice - Home Homecare Referral: Hospice (new) Homecare/Hospice Agency(s) chosen: Other [facility]) Discharge Transport: Ambulance Discharge Transport Agency Chosen: [facility]    Discharge Referrals Patient Preference: Chosen geographical local area/county shared with patient/family: Return/previous involvement (active with [facility] for palliative prior to admission) Patient Preference for Post-Acute Provider Form completed: Yes Case closed, patient/family agree with disposition plan: Yes  Attending physician confirms patient is stable for discharge home with hospice today. Spoke with patient in room, and with his wife Alcario Tinkey via phone call [phone]; both are in agreement with plan. Notified Amy with [facility] [phone] of patient's discharge. Called [facility] at 12pm.   Case Management Coordination Status: Coordination Complete  Carlyon CHRISTELLA Evetta Janet

## 2024-09-25 ENCOUNTER — Telehealth: Payer: Self-pay

## 2024-09-25 NOTE — Transitions of Care (Post Inpatient/ED Visit) (Signed)
" ° °  09/25/2024  Name: Tyler Richards MRN: [id number] DOB: September 12, 1951  Today's TOC FU Call Status: Today's TOC FU Call Status:: Successful TOC FU Call Completed TOC FU Call Complete Date: 09/25/24  Patient's Name and Date of Birth confirmed. Name, DOB  Transition Care Management Follow-up Telephone Call Date of Discharge: 09/24/24 Discharge Facility: Other ([facility]) Name of Other ([facility]) Discharge Facility: [facility] Type of Discharge: Inpatient Admission Primary Inpatient Discharge Diagnosis:: Hypotension; Hospice Care How have you been since you were released from the hospital?: Same (Sleeping right now) Any questions or concerns?: No  Items Reviewed: Did you receive and understand the discharge instructions provided?: Yes Medications obtained,verified, and reconciled?: No Medications Not Reviewed Reasons:: Other: (Wife states, I am meeting with the hospice nurse in an hour) Any new allergies since your discharge?: No Dietary orders reviewed?: NA Do you have support at home?: Yes People in Home [RPT]: spouse Name of Support/Comfort Primary Source: Spouse - Tyler Richards - on DPR and patient with stroke and wife is sole care giver  Medications Reviewed Today:  Wife states she will review with Hospice Nurse at 10:30 AM for planned visit Medications Reviewed Today   Medications were not reviewed in this encounter     Home Care and Equipment/Supplies: Were Home Health Services Ordered?: Yes Name of Home Health Agency:: [facility] Has Agency set up a time to come to your home?: Yes First Home Health Visit Date: 09/25/24 Any new equipment or medical supplies ordered?: No  Functional Questionnaire: Do you need assistance with bathing/showering or dressing?: Yes Do you need assistance with meal preparation?: Yes Do you need assistance with eating?: Yes Do you have difficulty maintaining continence: Yes Do you need assistance with  getting out of bed/getting out of a chair/moving?: Yes Do you have difficulty managing or taking your medications?: Yes  Follow up appointments reviewed: PCP Follow-up appointment confirmed?:  (Patient services are with [facility] now) Specialist Hospital Follow-up appointment confirmed?: NA Do you understand care options if your condition(s) worsen?: Yes-patient verbalized understanding  Assessment deferred patient under hospice services per wife and DC Summary chart review.  Richerd Fish, RN, BSN, CCM [facility], [facility] Management Coordinator Direct Dial: [phone]        "

## 2024-10-27 DEATH — deceased
